# Patient Record
Sex: Female | Born: 1940 | Race: White | Hispanic: No | Marital: Married | Smoking: Never smoker
Health system: Southern US, Community
[De-identification: ages and names within clinical notes are randomized; demographics above are authoritative.]

## PROBLEM LIST (undated history)

## (undated) DIAGNOSIS — J309 Allergic rhinitis, unspecified: Secondary | ICD-10-CM

## (undated) DIAGNOSIS — K589 Irritable bowel syndrome without diarrhea: Secondary | ICD-10-CM

## (undated) DIAGNOSIS — J40 Bronchitis, not specified as acute or chronic: Secondary | ICD-10-CM

## (undated) DIAGNOSIS — N289 Disorder of kidney and ureter, unspecified: Secondary | ICD-10-CM

## (undated) DIAGNOSIS — H1045 Other chronic allergic conjunctivitis: Secondary | ICD-10-CM

## (undated) DIAGNOSIS — I639 Cerebral infarction, unspecified: Secondary | ICD-10-CM

## (undated) DIAGNOSIS — J82 Pulmonary eosinophilia, not elsewhere classified: Principal | ICD-10-CM

## (undated) DIAGNOSIS — K5790 Diverticulosis of intestine, part unspecified, without perforation or abscess without bleeding: Secondary | ICD-10-CM

## (undated) DIAGNOSIS — J8281 Chronic eosinophilic pneumonia: Secondary | ICD-10-CM

## (undated) DIAGNOSIS — I10 Essential (primary) hypertension: Secondary | ICD-10-CM

## (undated) DIAGNOSIS — J328 Other chronic sinusitis: Secondary | ICD-10-CM

## (undated) HISTORY — DX: Bronchitis, not specified as acute or chronic: J40

## (undated) HISTORY — DX: Disorder of kidney and ureter, unspecified: N28.9

## (undated) HISTORY — PX: VESICOVAGINAL FISTULA CLOSURE W/ TAH: SUR271

## (undated) HISTORY — DX: Allergic rhinitis, unspecified: J30.9

## (undated) HISTORY — DX: Other chronic sinusitis: J32.8

## (undated) HISTORY — PX: TONSILLECTOMY: SUR1361

## (undated) HISTORY — PX: BRONCHOSCOPY: SUR163

## (undated) HISTORY — DX: Other chronic allergic conjunctivitis: H10.45

## (undated) HISTORY — PX: BREAST LUMPECTOMY: SHX2

## (undated) HISTORY — PX: APPENDECTOMY: SHX54

## (undated) HISTORY — DX: Pulmonary eosinophilia, not elsewhere classified: J82

## (undated) HISTORY — DX: Cerebral infarction, unspecified: I63.9

## (undated) HISTORY — DX: Chronic eosinophilic pneumonia: J82.81

---

## 1998-02-13 ENCOUNTER — Inpatient Hospital Stay (HOSPITAL_COMMUNITY): Admission: EM | Admit: 1998-02-13 | Discharge: 1998-02-17 | Payer: Self-pay | Admitting: Emergency Medicine

## 1998-02-13 ENCOUNTER — Encounter: Payer: Self-pay | Admitting: Endocrinology

## 1998-11-24 ENCOUNTER — Inpatient Hospital Stay (HOSPITAL_COMMUNITY): Admission: RE | Admit: 1998-11-24 | Discharge: 1998-11-26 | Payer: Self-pay | Admitting: Gastroenterology

## 1998-11-24 ENCOUNTER — Encounter: Payer: Self-pay | Admitting: Gastroenterology

## 1998-12-26 ENCOUNTER — Ambulatory Visit (HOSPITAL_COMMUNITY): Admission: RE | Admit: 1998-12-26 | Discharge: 1998-12-26 | Payer: Self-pay | Admitting: Gastroenterology

## 1999-11-27 ENCOUNTER — Encounter: Admission: RE | Admit: 1999-11-27 | Discharge: 1999-11-27 | Payer: Self-pay | Admitting: Cardiology

## 1999-11-27 ENCOUNTER — Encounter: Payer: Self-pay | Admitting: Cardiology

## 2000-12-01 ENCOUNTER — Encounter: Admission: RE | Admit: 2000-12-01 | Discharge: 2000-12-01 | Payer: Self-pay | Admitting: Internal Medicine

## 2000-12-01 ENCOUNTER — Encounter: Payer: Self-pay | Admitting: Internal Medicine

## 2001-03-03 ENCOUNTER — Encounter: Payer: Self-pay | Admitting: Internal Medicine

## 2001-03-03 ENCOUNTER — Encounter: Admission: RE | Admit: 2001-03-03 | Discharge: 2001-03-03 | Payer: Self-pay | Admitting: Internal Medicine

## 2001-12-06 ENCOUNTER — Encounter: Admission: RE | Admit: 2001-12-06 | Discharge: 2001-12-06 | Payer: Self-pay | Admitting: Internal Medicine

## 2001-12-06 ENCOUNTER — Encounter: Payer: Self-pay | Admitting: Internal Medicine

## 2002-12-26 ENCOUNTER — Encounter: Payer: Self-pay | Admitting: Internal Medicine

## 2002-12-26 ENCOUNTER — Encounter: Admission: RE | Admit: 2002-12-26 | Discharge: 2002-12-26 | Payer: Self-pay | Admitting: Internal Medicine

## 2004-01-29 ENCOUNTER — Encounter: Admission: RE | Admit: 2004-01-29 | Discharge: 2004-01-29 | Payer: Self-pay | Admitting: Internal Medicine

## 2004-03-30 ENCOUNTER — Encounter: Admission: RE | Admit: 2004-03-30 | Discharge: 2004-03-30 | Payer: Self-pay | Admitting: Internal Medicine

## 2004-08-19 ENCOUNTER — Ambulatory Visit: Payer: Self-pay | Admitting: Internal Medicine

## 2004-09-04 ENCOUNTER — Ambulatory Visit: Payer: Self-pay | Admitting: Internal Medicine

## 2004-09-07 ENCOUNTER — Ambulatory Visit: Payer: Self-pay | Admitting: Cardiology

## 2004-10-02 ENCOUNTER — Ambulatory Visit: Payer: Self-pay | Admitting: Internal Medicine

## 2005-04-28 ENCOUNTER — Encounter: Admission: RE | Admit: 2005-04-28 | Discharge: 2005-04-28 | Payer: Self-pay | Admitting: Internal Medicine

## 2005-09-28 ENCOUNTER — Ambulatory Visit: Payer: Self-pay | Admitting: Internal Medicine

## 2006-04-29 ENCOUNTER — Ambulatory Visit (HOSPITAL_COMMUNITY): Admission: RE | Admit: 2006-04-29 | Discharge: 2006-04-29 | Payer: Self-pay | Admitting: Internal Medicine

## 2006-05-05 ENCOUNTER — Encounter: Admission: RE | Admit: 2006-05-05 | Discharge: 2006-05-05 | Payer: Self-pay | Admitting: Internal Medicine

## 2007-05-08 ENCOUNTER — Encounter: Admission: RE | Admit: 2007-05-08 | Discharge: 2007-05-08 | Payer: Self-pay | Admitting: Internal Medicine

## 2007-05-12 ENCOUNTER — Encounter: Admission: RE | Admit: 2007-05-12 | Discharge: 2007-05-12 | Payer: Self-pay | Admitting: Internal Medicine

## 2008-06-03 ENCOUNTER — Encounter: Admission: RE | Admit: 2008-06-03 | Discharge: 2008-06-03 | Payer: Self-pay | Admitting: Internal Medicine

## 2008-09-02 ENCOUNTER — Ambulatory Visit: Payer: Self-pay | Admitting: Internal Medicine

## 2008-09-02 DIAGNOSIS — J328 Other chronic sinusitis: Secondary | ICD-10-CM | POA: Insufficient documentation

## 2008-09-02 DIAGNOSIS — J309 Allergic rhinitis, unspecified: Secondary | ICD-10-CM | POA: Insufficient documentation

## 2008-09-02 DIAGNOSIS — J40 Bronchitis, not specified as acute or chronic: Secondary | ICD-10-CM | POA: Insufficient documentation

## 2008-09-02 DIAGNOSIS — H1045 Other chronic allergic conjunctivitis: Secondary | ICD-10-CM | POA: Insufficient documentation

## 2008-09-02 LAB — CONVERTED CEMR LAB
Basophils Absolute: 0.1 10*3/uL (ref 0.0–0.1)
Basophils Relative: 1 % (ref 0.0–3.0)
Eosinophils Absolute: 1.2 10*3/uL — ABNORMAL HIGH (ref 0.0–0.7)
Eosinophils Relative: 14.6 % — ABNORMAL HIGH (ref 0.0–5.0)
HCT: 38.9 % (ref 36.0–46.0)
Hemoglobin: 13.3 g/dL (ref 12.0–15.0)
IgE (Immunoglobulin E), Serum: 36.2 intl units/mL (ref 0.0–180.0)
Lymphocytes Relative: 29.4 % (ref 12.0–46.0)
Lymphs Abs: 2.5 10*3/uL (ref 0.7–4.0)
MCHC: 34.3 g/dL (ref 30.0–36.0)
MCV: 95 fL (ref 78.0–100.0)
Monocytes Absolute: 0.5 10*3/uL (ref 0.1–1.0)
Monocytes Relative: 5.8 % (ref 3.0–12.0)
Neutro Abs: 4.1 10*3/uL (ref 1.4–7.7)
Neutrophils Relative %: 49.2 % (ref 43.0–77.0)
Platelets: 263 10*3/uL (ref 150.0–400.0)
RBC: 4.09 M/uL (ref 3.87–5.11)
RDW: 13 % (ref 11.5–14.6)
WBC: 8.4 10*3/uL (ref 4.5–10.5)

## 2008-09-03 ENCOUNTER — Telehealth (INDEPENDENT_AMBULATORY_CARE_PROVIDER_SITE_OTHER): Payer: Self-pay | Admitting: *Deleted

## 2009-03-18 ENCOUNTER — Ambulatory Visit: Payer: Self-pay | Admitting: Internal Medicine

## 2009-06-16 ENCOUNTER — Encounter: Admission: RE | Admit: 2009-06-16 | Discharge: 2009-06-16 | Payer: Self-pay | Admitting: Internal Medicine

## 2010-05-12 ENCOUNTER — Other Ambulatory Visit: Payer: Self-pay | Admitting: Internal Medicine

## 2010-05-12 DIAGNOSIS — Z1239 Encounter for other screening for malignant neoplasm of breast: Secondary | ICD-10-CM

## 2010-05-19 ENCOUNTER — Ambulatory Visit
Admission: RE | Admit: 2010-05-19 | Discharge: 2010-05-19 | Payer: Self-pay | Source: Home / Self Care | Attending: Internal Medicine | Admitting: Internal Medicine

## 2010-05-19 ENCOUNTER — Ambulatory Visit: Admit: 2010-05-19 | Payer: Self-pay | Admitting: Internal Medicine

## 2010-05-20 ENCOUNTER — Telehealth: Payer: Self-pay | Admitting: Internal Medicine

## 2010-05-20 ENCOUNTER — Encounter: Payer: Self-pay | Admitting: Internal Medicine

## 2010-05-27 NOTE — Progress Notes (Signed)
Summary: needs copies of 1/31 prescriptions and instructions and results  Phone Note Call from Patient   Caller: Patient Call For: dr Axelle Szwed Summary of Call: Patient phoned she was seen yesterday and was sent down for chest xray, she was given three pieces of paper the order for the chest xray, her instruction sheet and a prescription for a z-pack. When she got home she didnt have any of the sheets and wanted to know if we could send her a copy of her instruction sheet and the prescription. She can be reached [phone] wants results of chest xray Initial call taken by: Vedia Coffer,  May 20, 2010 9:16 AM  Follow-up for Phone Call        Pt thinks she left her patient  instruction sheet and zpak prescription in xray yesterday. SHe is requesting I mail a copy of instruction sheet to her and send rx to [facility]. Copy mailed, rx sent, and pt also advised of CXR results. Carron Curie CMA  May 20, 2010 10:10 AM     Prescriptions: ZITHROMAX Z-PAK 250 MG TABS (AZITHROMYCIN) 2 today then one daily  #1 pak x 0   Entered by:   Carron Curie CMA   Authorized by:   Waymon Budge MD   Signed by:   Carron Curie CMA on 05/20/2010   Method used:   [facility]        [facility] [id number][facility] (retail)       [address]       [place], [place]  [address]       Ph: [phone] or [phone]       Fax: [phone]   RxID:   [id number]

## 2010-05-27 NOTE — Letter (Signed)
Summary: [facility]  [address]   [place], [place] [address]   Phone: [phone]  Fax: [phone]    05/19/2010  Patient Instructions:   1)  Please schedule a follow-up appointment in 1 year. 2)  neb a 3)  depo 80 4)  script to hold for Z pak antibiotic 5)  A chest x-ray has been recommended.  Your imaging study may require preauthorization.  6)  Consider plain mucinex 600 mg twice daily if you need help clearing thick mucus.

## 2010-05-27 NOTE — Assessment & Plan Note (Signed)
Summary: asthma/jd   Primary Provider/Referring Provider:  Perini  CC:  c/o chest tightness, cough with yellow sputum, and sob with exertion.  symptoms x 1 week.  Wheeze sometimes at night.  History of Present Illness: 09/02/08- allergic rhinitis, chronic rhinosinuisitis, allergfic conjunctivitis C/oher chronic nasal congestion- worse this Spring. # months ago began chest congestion- caqlled adult onset asthma by Dr Waynard Edwards. He gave sample Proair- not much help. Gave asmanex which helped once before but not now. Took a large green antibiotic (Avelox?), then she began some left over doxycycline which is helping more. coughing up small green globules. some tightness and dyspnea. Denies fever. Minor GI from antibiotics. Prior skin tests - not on  vaccine in 10- 15 yrs. Her husband asks about being able to give her shots at home.   31-Mar-2009- Allergic rhinitis, chronic rhinosinusitis, allergic conjunctivitis Nose is best she expects to get. She asked about Xyzal compared with Zyrtec.  Bothersome sense of postnasal drip at night with cough but no heartburn or recognized reflux. Eyes burn and itch, not relieved by otc eyedrop.  May 19, 2010-  Allergic rhinitis, chronic rhinosinusitis, allergic conjunctivitis Nurse-CC: c/o chest tightness, cough with yellow sputum, sob with exertion.  symptoms x 1 week.  Wheeze sometimes at night. Off Advair- uses Asmanex intermittently.No rescue inhaler. Acute visit- Head congestion moving into chest with raspy cough. Tussive chest sore. Denies fever, purulent or GI. Frontal headache. Occasional wheeze at night, not routine.  Dr Perini's NP gave a neb for something similar a few weeks ago with benefit. No recent CXR.   Preventive Screening-Counseling & Management  Alcohol-Tobacco     Smoking Status: never  Current Medications (verified): 1)  Premarin 0.3 Mg Tabs (Estrogens Conjugated) .... Take 1 By Mouth Every Other Day 2)  Simvastatin 40 Mg Tabs  (Simvastatin) .... Take 1 By Mouth Once Daily 3)  Diovan Hct 80-12.5 Mg Tabs (Valsartan-Hydrochlorothiazide) .... Take 1 By Mouth Once Daily 4)  Singulair 10 Mg Tabs (Montelukast Sodium) .... Take 1 By Mouth At Bedtime 5)  Zyrtec Allergy 10 Mg Tabs (Cetirizine Hcl) .... Take 1 By Mouth  Every Morning 6)  Nasacort Aq 55 Mcg/act Aers (Triamcinolone Acetonide(Nasal)) .Marland Kitchen.. 1-2 Sprays in Each Nostril Daily As Needed 7)  Asmanex 14 Metered Doses 220 Mcg/inh Aepb (Mometasone Furoate) .Marland Kitchen.. 1-2 Ouffs Once Daily As Needed 8)  Xalatan 0.005 % Soln (Latanoprost) .Marland Kitchen.. 1drop Right Eye Dailly  Allergies: 1)  ! 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  Married to Dr [name]  Risk Factors: Smoking Status: never (05/19/2010)  Review of Systems      See HPI       The patient complains of non-productive cough and nasal congestion/difficulty breathing through nose.  The patient denies shortness of breath with activity, shortness of breath at rest, productive cough, coughing up blood, chest pain, irregular heartbeats, acid heartburn, indigestion, loss of appetite, weight change, abdominal pain, difficulty swallowing, sore throat, tooth/dental problems, and headaches.    Vital Signs:  Patient profile:   70 year old female Height:      65 inches Weight:      150.50 pounds BMI:  25.14 O2 Sat:      98 % on Room air Pulse rate:   65 / minute BP sitting:   130 / 80  (left arm) Cuff size:   regular  Vitals Entered By: Kandice Hams CMA  (May 19, 2010 4:00 PM)  O2 Flow:  Room air CC: c/o chest tightness,cough with yellow sputum, sob with exertion.  symptoms x 1 week.  Wheeze sometimes at night   Physical Exam  Additional Exam:  General: A/Ox3; pleasant and cooperative, NAD, looks tired SKIN: no rash, lesions NODES: no lymphadenopathy HEENT: [place]/AT, EOM- WNL, Conjuctivae- clear, PERRLA, TM-WNL, Nose- narrow nasal, Throat- clear and wnl, Mallampati Ii NECK: Supple w/ fair ROM, JVD- none, normal carotid impulses w/o bruits Thyroid- n CHEST: Clear to P&A, coarse breath sounds   bases- not rhonchi or rales. 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  Problem # 2:  Hx of BRONCHITIS (ICD-490)  Developing bronchitis. There is more fine crackle than I expected and I will do CXR, give neb and depo and script to hold for Z pak. Discussed mucinex. She is not describing a persistent asthma pattern and i decided not to make an issue of her asmanex use today. We may reconsider that later.  The following medications were removed from the medication list:    Benzonatate 100 Mg Caps (Benzonatate) .Marland Kitchen... 1 or 2 four times a day as needed cough    Advair Diskus 250-50 Mcg/dose Misc (Fluticasone-salmeterol) ..... Inhale 1 puff two times a day.  rinse mouth out after use Her updated medication list for this problem includes:    Singulair 10 Mg Tabs (Montelukast sodium) .Marland Kitchen... Take 1 by mouth at bedtime    Asmanex 14 Metered Doses 220 Mcg/inh Aepb (Mometasone furoate) .Marland Kitchen... 1-2 ouffs once daily as needed    Zithromax Z-pak 250 Mg Tabs (Azithromycin) .Marland Kitchen... 2 today then one daily  Medications Added to Medication List This Visit: 1)  Premarin 0.3  Mg Tabs (Estrogens conjugated) .... Take 1 by mouth every other day 2)  Asmanex 14 Metered Doses 220 Mcg/inh Aepb (Mometasone furoate) .Marland Kitchen.. 1-2 ouffs once daily as needed 3)  Xalatan 0.005 % Soln (Latanoprost) .Marland Kitchen.. 1drop right eye dailly 4)  Zithromax Z-pak 250 Mg Tabs (Azithromycin) .... 2 today then one daily  Other Orders: Est. Patient Level III (65784) T-2 View CXR (71020TC)  Patient Instructions: 1)  Please schedule a follow-up appointment in 1 year. 2)  neb a 3)  depo 80 4)  script to hold for Z pak antibiotic 5)  A chest x-ray has been recommended.  Your imaging study may require preauthorization.  6)  Consider plain mucinex 600 mg twice daily if you need help clearing thick mucus. 7)  ..........................................................................................Marland Kitchen 8)  DG CHEST 2 VIEW - [id number] 9)    10)  Clinical Data: Cough, chest tightness 11)    12)  CHEST - 2 VIEW 13)    14)  Comparison: None. 15)    16)  Findings: The lungs are clear.  Mediastinal contours are normal. 17)  The heart is within upper limits of normal.  No acute bony 18)  abnormality is seen. 19)    20)  IMPRESSION: 21)  No active lung disease.  Borderline cardiomegaly. 22)    23)  Read By:  Juline Patch,  M.D.     24)  Released By:  Juline Patch,  M.D. 25)  Signed by Waymon Budge MD on 05/19/2010 at 7:41 PM 26)  ________________________________________________________________________ 27)  CXR- no active disease. 28)  Signed by Waymon Budge MD on 05/19/2010 at 7:42 PM 29)  cc Dr Waynard Edwards Prescriptions: Christena Deem Z-PAK 250 MG TABS (AZITHROMYCIN) 2 today then one daily  #1 pak x 0   Entered and Authorized by:   Waymon Budge MD   Signed by:   Waymon Budge MD on 05/19/2010   Method used:   Print then Give to Patient   RxID:   [id number]

## 2010-06-19 ENCOUNTER — Ambulatory Visit
Admission: RE | Admit: 2010-06-19 | Discharge: 2010-06-19 | Disposition: A | Discharge status: Home / Self Care | Payer: Medicare Other | Source: Ambulatory Visit | Attending: Internal Medicine | Admitting: Internal Medicine

## 2010-06-19 DIAGNOSIS — Z1239 Encounter for other screening for malignant neoplasm of breast: Secondary | ICD-10-CM

## 2010-06-22 ENCOUNTER — Emergency Department (HOSPITAL_COMMUNITY): Payer: Medicare Other

## 2010-06-22 ENCOUNTER — Emergency Department (HOSPITAL_COMMUNITY)
Admission: EM | Admit: 2010-06-22 | Discharge: 2010-06-22 | Disposition: A | Discharge status: Home / Self Care | Payer: Medicare Other | Attending: Emergency Medicine | Admitting: Emergency Medicine

## 2010-06-22 DIAGNOSIS — N390 Urinary tract infection, site not specified: Secondary | ICD-10-CM | POA: Insufficient documentation

## 2010-06-22 DIAGNOSIS — R079 Chest pain, unspecified: Secondary | ICD-10-CM | POA: Insufficient documentation

## 2010-06-22 DIAGNOSIS — R1013 Epigastric pain: Secondary | ICD-10-CM | POA: Insufficient documentation

## 2010-06-22 DIAGNOSIS — J189 Pneumonia, unspecified organism: Secondary | ICD-10-CM | POA: Insufficient documentation

## 2010-06-22 DIAGNOSIS — E876 Hypokalemia: Secondary | ICD-10-CM | POA: Insufficient documentation

## 2010-06-22 DIAGNOSIS — I10 Essential (primary) hypertension: Secondary | ICD-10-CM | POA: Insufficient documentation

## 2010-06-22 DIAGNOSIS — R51 Headache: Secondary | ICD-10-CM | POA: Insufficient documentation

## 2010-06-22 LAB — COMPREHENSIVE METABOLIC PANEL
ALT: 19 U/L (ref 0–35)
AST: 18 U/L (ref 0–37)
Albumin: 3 g/dL — ABNORMAL LOW (ref 3.5–5.2)
Alkaline Phosphatase: 117 U/L (ref 39–117)
CO2: 29 mEq/L (ref 19–32)
Calcium: 9 mg/dL (ref 8.4–10.5)
Creatinine, Ser: 0.61 mg/dL (ref 0.4–1.2)
GFR calc Af Amer: >60 mL/min (ref >60)
GFR calc non Af Amer: >60 mL/min (ref >60)
Glucose, Bld: 111 mg/dL — ABNORMAL HIGH (ref 70–99)
Sodium: 137 mEq/L (ref 135–145)
Total Bilirubin: 0.9 mg/dL (ref 0.3–1.2)
Total Protein: 7.1 g/dL (ref 6.0–8.3)

## 2010-06-22 LAB — URINE MICROSCOPIC-ADD ON

## 2010-06-22 LAB — DIFFERENTIAL
Basophils Absolute: 0 10*3/uL (ref 0.0–0.1)
Basophils Relative: 0 % (ref 0–1)
Eosinophils Absolute: 0.6 10*3/uL (ref 0.0–0.7)
Lymphs Abs: 1.3 10*3/uL (ref 0.7–4.0)
Monocytes Absolute: 1.3 10*3/uL — ABNORMAL HIGH (ref 0.1–1.0)
Monocytes Relative: 8 % (ref 3–12)
Neutro Abs: 14.1 10*3/uL — ABNORMAL HIGH (ref 1.7–7.7)
Neutrophils Relative %: 81 % — ABNORMAL HIGH (ref 43–77)

## 2010-06-22 LAB — URINALYSIS, ROUTINE W REFLEX MICROSCOPIC
Glucose, UA: NEGATIVE mg/dL
Hgb urine dipstick: NEGATIVE
Ketones, ur: 15 mg/dL — AB
Protein, ur: NEGATIVE mg/dL
Specific Gravity, Urine: 1.028 (ref 1.005–1.030)
Urobilinogen, UA: 1 mg/dL (ref 0.0–1.0)
pH: 6 (ref 5.0–8.0)

## 2010-06-22 LAB — CBC
HCT: 37.9 % (ref 36.0–46.0)
Hemoglobin: 12.9 g/dL (ref 12.0–15.0)
MCH: 31.4 pg (ref 26.0–34.0)
MCHC: 34 g/dL (ref 30.0–36.0)
Platelets: 506 10*3/uL — ABNORMAL HIGH (ref 150–400)
RBC: 4.11 MIL/uL (ref 3.87–5.11)
RDW: 12.7 % (ref 11.5–15.5)
WBC: 17.4 10*3/uL — ABNORMAL HIGH (ref 4.0–10.5)

## 2010-06-22 LAB — POCT CARDIAC MARKERS
CKMB, poc: 1.7 ng/mL (ref 1.0–8.0)
Myoglobin, poc: 205 ng/mL (ref 12–200)
Troponin i, poc: <0.05 ng/mL (ref 0.00–0.09)

## 2010-06-25 LAB — URINE CULTURE
Colony Count: 30000
Culture  Setup Time: 201203060119

## 2010-07-03 ENCOUNTER — Ambulatory Visit (INDEPENDENT_AMBULATORY_CARE_PROVIDER_SITE_OTHER): Payer: Medicare Other | Admitting: Internal Medicine

## 2010-07-03 ENCOUNTER — Encounter (INDEPENDENT_AMBULATORY_CARE_PROVIDER_SITE_OTHER): Payer: Self-pay | Admitting: *Deleted

## 2010-07-03 ENCOUNTER — Other Ambulatory Visit: Payer: Self-pay | Admitting: Internal Medicine

## 2010-07-03 ENCOUNTER — Encounter: Payer: Self-pay | Admitting: Internal Medicine

## 2010-07-03 ENCOUNTER — Inpatient Hospital Stay (HOSPITAL_COMMUNITY)
Admission: AD | Admit: 2010-07-03 | Discharge: 2010-07-17 | DRG: 166 | Disposition: A | Discharge status: Home / Self Care | Payer: Medicare Other | Source: Ambulatory Visit | Attending: Internal Medicine | Admitting: Internal Medicine

## 2010-07-03 ENCOUNTER — Other Ambulatory Visit: Payer: Medicare Other

## 2010-07-03 ENCOUNTER — Ambulatory Visit (INDEPENDENT_AMBULATORY_CARE_PROVIDER_SITE_OTHER)
Admission: RE | Admit: 2010-07-03 | Discharge: 2010-07-03 | Disposition: A | Discharge status: Home / Self Care | Payer: Medicare Other | Source: Ambulatory Visit | Attending: Internal Medicine | Admitting: Internal Medicine

## 2010-07-03 ENCOUNTER — Inpatient Hospital Stay (HOSPITAL_COMMUNITY): Payer: Medicare Other

## 2010-07-03 DIAGNOSIS — R7309 Other abnormal glucose: Secondary | ICD-10-CM | POA: Diagnosis not present

## 2010-07-03 DIAGNOSIS — I10 Essential (primary) hypertension: Secondary | ICD-10-CM | POA: Diagnosis present

## 2010-07-03 DIAGNOSIS — J8289 Other pulmonary eosinophilia, not elsewhere classified: Principal | ICD-10-CM | POA: Diagnosis present

## 2010-07-03 DIAGNOSIS — Z79899 Other long term (current) drug therapy: Secondary | ICD-10-CM

## 2010-07-03 DIAGNOSIS — J189 Pneumonia, unspecified organism: Secondary | ICD-10-CM

## 2010-07-03 DIAGNOSIS — E8809 Other disorders of plasma-protein metabolism, not elsewhere classified: Secondary | ICD-10-CM | POA: Diagnosis not present

## 2010-07-03 DIAGNOSIS — R51 Headache: Secondary | ICD-10-CM

## 2010-07-03 DIAGNOSIS — J159 Unspecified bacterial pneumonia: Secondary | ICD-10-CM

## 2010-07-03 DIAGNOSIS — T367X5A Adverse effect of antifungal antibiotics, systemically used, initial encounter: Secondary | ICD-10-CM | POA: Diagnosis not present

## 2010-07-03 DIAGNOSIS — I509 Heart failure, unspecified: Secondary | ICD-10-CM

## 2010-07-03 DIAGNOSIS — B370 Candidal stomatitis: Secondary | ICD-10-CM | POA: Diagnosis not present

## 2010-07-03 DIAGNOSIS — E872 Acidosis, unspecified: Secondary | ICD-10-CM | POA: Diagnosis not present

## 2010-07-03 DIAGNOSIS — J329 Chronic sinusitis, unspecified: Secondary | ICD-10-CM | POA: Diagnosis present

## 2010-07-03 DIAGNOSIS — N179 Acute kidney failure, unspecified: Secondary | ICD-10-CM | POA: Diagnosis not present

## 2010-07-03 DIAGNOSIS — J82 Pulmonary eosinophilia, not elsewhere classified: Secondary | ICD-10-CM

## 2010-07-03 DIAGNOSIS — J9600 Acute respiratory failure, unspecified whether with hypoxia or hypercapnia: Secondary | ICD-10-CM | POA: Diagnosis not present

## 2010-07-03 DIAGNOSIS — R109 Unspecified abdominal pain: Secondary | ICD-10-CM

## 2010-07-03 DIAGNOSIS — H1045 Other chronic allergic conjunctivitis: Secondary | ICD-10-CM | POA: Diagnosis present

## 2010-07-03 DIAGNOSIS — J8281 Chronic eosinophilic pneumonia: Secondary | ICD-10-CM | POA: Insufficient documentation

## 2010-07-03 DIAGNOSIS — E785 Hyperlipidemia, unspecified: Secondary | ICD-10-CM | POA: Diagnosis present

## 2010-07-03 DIAGNOSIS — J309 Allergic rhinitis, unspecified: Secondary | ICD-10-CM | POA: Diagnosis present

## 2010-07-03 DIAGNOSIS — E876 Hypokalemia: Secondary | ICD-10-CM | POA: Diagnosis present

## 2010-07-03 DIAGNOSIS — R231 Pallor: Secondary | ICD-10-CM | POA: Diagnosis not present

## 2010-07-03 LAB — COMPREHENSIVE METABOLIC PANEL
Albumin: 2.4 g/dL — ABNORMAL LOW (ref 3.5–5.2)
Alkaline Phosphatase: 235 U/L — ABNORMAL HIGH (ref 39–117)
BUN: 14 mg/dL (ref 6–23)
Chloride: 96 mEq/L (ref 96–112)
Glucose, Bld: 112 mg/dL — ABNORMAL HIGH (ref 70–99)
Potassium: 3.3 mEq/L — ABNORMAL LOW (ref 3.5–5.1)
Total Bilirubin: 0.8 mg/dL (ref 0.3–1.2)

## 2010-07-03 LAB — CBC WITH DIFFERENTIAL/PLATELET
Eosinophils Absolute: 2.5 10*3/uL — ABNORMAL HIGH (ref 0.0–0.7)
MCHC: 34.1 g/dL (ref 30.0–36.0)
MCV: 93.3 fl (ref 78.0–100.0)
Monocytes Absolute: 1.5 10*3/uL — ABNORMAL HIGH (ref 0.1–1.0)
Neutrophils Relative %: 73.4 % (ref 43.0–77.0)
Platelets: 731 10*3/uL — ABNORMAL HIGH (ref 150.0–400.0)
WBC: 22.2 10*3/uL (ref 4.5–10.5)

## 2010-07-03 LAB — BASIC METABOLIC PANEL
BUN: 17 mg/dL (ref 6–23)
Chloride: 95 mEq/L — ABNORMAL LOW (ref 96–112)
Creatinine, Ser: 0.9 mg/dL (ref 0.4–1.2)

## 2010-07-03 LAB — URINALYSIS, ROUTINE W REFLEX MICROSCOPIC
Hgb urine dipstick: NEGATIVE
Nitrite: NEGATIVE
Urobilinogen, UA: 0.2 (ref 0.0–1.0)

## 2010-07-03 MED ORDER — IOHEXOL 300 MG/ML  SOLN
100.0000 mL | Freq: Once | INTRAMUSCULAR | Status: AC | PRN
  Administered 2010-07-03: 100 mL via INTRAVENOUS

## 2010-07-04 ENCOUNTER — Encounter: Payer: Self-pay | Admitting: Internal Medicine

## 2010-07-04 DIAGNOSIS — K7689 Other specified diseases of liver: Secondary | ICD-10-CM

## 2010-07-04 DIAGNOSIS — R6889 Other general symptoms and signs: Secondary | ICD-10-CM

## 2010-07-04 DIAGNOSIS — J189 Pneumonia, unspecified organism: Secondary | ICD-10-CM

## 2010-07-04 NOTE — H&P (Signed)
[name], [name]             ACCOUNT NO.:  [id number]  MEDICAL RECORD NO.:  [id number]           PATIENT TYPE:  I  LOCATION:  1344                         FACILITY:  [facility]  PHYSICIAN:  Clinton D. Maple Hudson, MD, FCCP, FACPDATE OF BIRTH:  06/10/40  DATE OF ADMISSION:  07/03/2010 DATE OF DISCHARGE:                             HISTORY & PHYSICAL   ADMISSION DIAGNOSES: 1. Pneumonia, not otherwise specified. 2. Headache. 3. Right flank pain, question urinary infection.  HISTORY OF PRESENT ILLNESS:  A 70 year old nonsmoking white female, wife of Dr. [name], brought by her husband to my office this afternoon with concern of abnormal chest x-ray and fever.  I follow her for allergic rhinitis, chronic rhinosinusitis, and allergic conjunctivitis. Dr. Rodrigo Ran is her primary physician.  She was visiting in [facility] approximately 3 weeks ago where she felt well, but noticed bilateral leg cramps.  She returned home around March 1st, but by March 5th had become acutely ill with a febrile illness and some dry cough.  She was treated with a Z-PAK, but failed to improve.  On March 5th, she had gone to the emergency room where chest x-ray showed bilateral pulmonary infiltrates and leukocytosis.  She also was experiencing some right flank pain and there was concern that she had both pneumonia and urinary infection.  She was treated with antibiotics, which I believe included Rocephin and Avelox.  On followup with Dr. Waynard Edwards on March 9th, chest x-ray again showed bilateral diffuse infiltrates predominantly in the left lower lobe and left upper lobe with no effusion.  She was given Rocephin IM and started on Augmentin. She had given previous history of rash allergy from PENICILLIN, but seemed to tolerate the Augmentin.  On arrival at my office today as I understood the history, she had recognize no sick exposure.  Still feeling badly, predominately with weakness, difficulty  getting out of bed.  Some dry cough has persisted.  There have not been significant chills or purulent sputum.  She has had some right frontal headache and maybe right maxillary pain.  She is still having some right flank pain, but without dysuria.  She denied arthralgias and indicated the myalgias had improved.  There had been hypokalemia in the emergency room visit, which I believe was treated.  She denied purulence or blood.  Fever is persistent now for 16 days.  There has been no chest pain.  She had had no GI upset including nausea or vomiting, but had been anorexic.  Today, she had a loose stool leading to some discussion to the possibility of early C difficile colitis versus simple antibiotic GI upset.  She had started a probiotic.  I sent her for laboratory including chest x-ray, which showed worsening bilateral pneumonia with patchy infiltrates in all lobes, no effusion.  I reviewed the images myself on the available screen.  Her WBC was 22,200 with 73% PMNs and 11% eosinophils. Urinalysis showed 5-7 WBCs.  I discussed with Dr. Daleen Bo Ava from her primary care group.  I am admitting to [facility] for evaluation and treatment.  We are getting the CT scan of the  chest for better definition of the pulmonary parenchymal infiltrates and exclusion of pulmonary embolism, further appropriate labs and initiation of antibiotic therapy.  CURRENT HOME MEDICATION: 1. Premarin 0.3 mg every other day. 2. Simvastatin 40 mg p.o. daily. 3. Diovan HCT 80/12.5 mg 1 daily. 4. Singulair 10 mg 1 daily. 5. Zyrtec 10 mg daily. 6. Nasacort AQ 1-2 sprays each nostril daily when needed. 7. Asmanex 220 one-two puffs once daily when needed. 8. Xalatan 0.005% 1 drop right eye daily.  MEDICAL ALLERGY:  PENICILLIN, history of rash.  REVIEW OF SYSTEMS:  See HPI.  Complaining now of weakness, easy fatigue, anorexia, fever, dyspnea on exertion, headache, right flank pain. Negative for weight gain,  vision loss, decreased hearing, hoarseness, chest pain, syncope, peripheral edema, hemoptysis, abdominal pain, severe indigestion or heartburn, hematuria, rash, abnormal bleeding, lymphadenopathy or angioedema, syncope or confusion, vomiting or active arthritis pain.  PAST MEDICAL HISTORY: 1. Bronchitis. 2. Rhinosinusitis. 3. Chronic conjunctivitis. 4. Allergic rhinitis.  PAST SURGICAL HISTORY:  C-section x4, hysterectomy, benign right breast lump, tonsillectomy, appendectomy.  Family History: Father died aortic aneurysm. Mother died Congestive heart failure.  Social History:  Never smoked, married to Dr. [name]. First husband died of liver disease.  OBJECTIVE:  VITAL SIGNS:  Height 65 inches, weight 139 pounds, BMI 23.21, oxygen saturation was 90% on room air, pulse rate 84. BP 104/65 GENERAL:  Tired appearing, but alert, fully oriented, cooperative, and not in active physical distress. SKIN:  No visible rash.  Adenopathy:  None found at the neck, supraclavicular, or axillary areas. HEENT:  Oral mucosa clear.  Vision and hearing grossly intact.  No conjunctival injection.  Speech is articulate.  There is no neck vein distention or stridor.  No visible postnasal drainage. NECK:  Supple. CHEST:  Quiet air flow with fine high-pitched crackles, especially in the right infrascapular area, but no dullness or rub.  No cough and no increased work of breathing. HEART:  Trace systolic murmur at the left sternal border.  Regular rhythm.  No rub. ABDOMEN:  Nontender without enlargement of liver or spleen.  Bowel sounds are faintly heard. BREASTS/RECTAL/PELVIC:  Not examined, noncontributory. EXTREMITIES:  No edema, cyanosis, or clubbing.  Calves were nontender. Homan's negative. NEUROLOGIC:  Grossly intact on observation, ambulatory without tremor.  IMPRESSION:  Because of the leukocytosis, I have to consider first possibility that this is a bacterial infection with a  resistant organism. Leukocytosis in this range would be unusual with a viral pneumonia.   Despite her history of rash allergy to PENICILLIN, I note that she had been on Augmentin for the past week without apparent problem and therefore we will start with vancomycin and Zosyn recognizing that early revision may be required.  Air travel and exposure in [facility] raise questions of coccidioidomycosis, legionnaires, pulmonary embolism.  Lack of response to the broad- spectrum antibiotics she has had so far, raises the possibility that this is a cryptogenic organizing pneumonia/bronchiolitis obliterans- organizing pneumonia.  Her predominant symptom seems to be fatigue with incidental mention of right frontal and maxillary headache, raising possibility of sinusitis, and her recent right flank pain, raising question of urologic process. Early sepsis ARDS is possible, but she has been on antibiotics now for over 2 weeks, HR only 90, HCO3 28.  Her urine is not active enough to make the concern that she has a urinary infection with sepsis, but recognize we may be looking at a muddied picture by now of partly treated infection. I am considering possible  need for lung tissue biopsy. Renal function looks normal. Hypokalemia may be due to the HCT in her outpatient BP therapy.     Clinton D. Maple Hudson, MD, FCCP, FACP     CDY/MEDQ  D:  07/03/2010  T:  07/04/2010  Job:  [id number]  Electronically Signed by Jetty Duhamel MD FCCP FACP on 07/04/2010 02:11:11 PM

## 2010-07-05 LAB — DIFFERENTIAL
Basophils Absolute: 0 10*3/uL (ref 0.0–0.1)
Lymphocytes Relative: 14 % (ref 12–46)
Monocytes Absolute: 1.4 10*3/uL — ABNORMAL HIGH (ref 0.1–1.0)
Neutro Abs: 10.8 10*3/uL — ABNORMAL HIGH (ref 1.7–7.7)

## 2010-07-05 LAB — CBC
HCT: 32.7 % — ABNORMAL LOW (ref 36.0–46.0)
Hemoglobin: 10.8 g/dL — ABNORMAL LOW (ref 12.0–15.0)
MCHC: 33 g/dL (ref 30.0–36.0)

## 2010-07-06 ENCOUNTER — Inpatient Hospital Stay (HOSPITAL_COMMUNITY): Payer: Medicare Other

## 2010-07-06 ENCOUNTER — Other Ambulatory Visit: Payer: Self-pay | Admitting: Internal Medicine

## 2010-07-06 DIAGNOSIS — R918 Other nonspecific abnormal finding of lung field: Secondary | ICD-10-CM

## 2010-07-06 DIAGNOSIS — J189 Pneumonia, unspecified organism: Secondary | ICD-10-CM

## 2010-07-06 DIAGNOSIS — D72829 Elevated white blood cell count, unspecified: Secondary | ICD-10-CM

## 2010-07-06 LAB — CBC
HCT: 30.8 % — ABNORMAL LOW (ref 36.0–46.0)
Hemoglobin: 10.2 g/dL — ABNORMAL LOW (ref 12.0–15.0)
MCH: 30.4 pg (ref 26.0–34.0)
MCH: 30.1 pg (ref 26.0–34.0)
MCHC: 33.1 g/dL (ref 30.0–36.0)
Platelets: 721 10*3/uL — ABNORMAL HIGH (ref 150–400)
RBC: 3.62 MIL/uL — ABNORMAL LOW (ref 3.87–5.11)
RDW: 13.1 % (ref 11.5–15.5)
WBC: 18.8 10*3/uL — ABNORMAL HIGH (ref 4.0–10.5)

## 2010-07-06 LAB — DIFFERENTIAL
Lymphocytes Relative: 13 % (ref 12–46)
Monocytes Absolute: 1.4 10*3/uL — ABNORMAL HIGH (ref 0.1–1.0)
Monocytes Relative: 8 % (ref 3–12)
Neutro Abs: 11.2 10*3/uL — ABNORMAL HIGH (ref 1.7–7.7)

## 2010-07-06 LAB — COMPREHENSIVE METABOLIC PANEL
ALT: 19 U/L (ref 0–35)
CO2: 26 mEq/L (ref 19–32)
Calcium: 8.2 mg/dL — ABNORMAL LOW (ref 8.4–10.5)
Creatinine, Ser: 0.85 mg/dL (ref 0.4–1.2)
GFR calc non Af Amer: >60 mL/min (ref >60)
Glucose, Bld: 98 mg/dL (ref 70–99)

## 2010-07-06 LAB — BASIC METABOLIC PANEL
Calcium: 8.4 mg/dL (ref 8.4–10.5)
Creatinine, Ser: 0.69 mg/dL (ref 0.4–1.2)
GFR calc non Af Amer: >60 mL/min (ref >60)
Glucose, Bld: 98 mg/dL (ref 70–99)
Sodium: 138 mEq/L (ref 135–145)

## 2010-07-06 LAB — GAMMA GT: GGT: 235 U/L — ABNORMAL HIGH (ref 7–51)

## 2010-07-06 LAB — PROTIME-INR: Prothrombin Time: 13.9 seconds (ref 11.6–15.2)

## 2010-07-06 MED ORDER — GADOBENATE DIMEGLUMINE 529 MG/ML IV SOLN
15.0000 mL | Freq: Once | INTRAVENOUS | Status: AC | PRN
  Administered 2010-07-06: 15 mL via INTRAVENOUS

## 2010-07-07 ENCOUNTER — Telehealth: Payer: Self-pay | Admitting: Internal Medicine

## 2010-07-07 DIAGNOSIS — J8289 Other pulmonary eosinophilia, not elsewhere classified: Secondary | ICD-10-CM

## 2010-07-07 DIAGNOSIS — J82 Pulmonary eosinophilia, not elsewhere classified: Secondary | ICD-10-CM

## 2010-07-07 LAB — COCCIDIOIDES ANTIBODIES: Coccidioides Ab CF: <1:2 {titer}

## 2010-07-07 LAB — HEMOCCULT GUIAC POC 1CARD (OFFICE): Fecal Occult Bld: NEGATIVE

## 2010-07-07 LAB — CBC
HCT: 31.1 % — ABNORMAL LOW (ref 36.0–46.0)
Hemoglobin: 10.1 g/dL — ABNORMAL LOW (ref 12.0–15.0)
MCH: 30.1 pg (ref 26.0–34.0)
MCHC: 32.5 g/dL (ref 30.0–36.0)

## 2010-07-07 LAB — BASIC METABOLIC PANEL
CO2: 25 mEq/L (ref 19–32)
Calcium: 7.9 mg/dL — ABNORMAL LOW (ref 8.4–10.5)
Glucose, Bld: 110 mg/dL — ABNORMAL HIGH (ref 70–99)
Sodium: 139 mEq/L (ref 135–145)

## 2010-07-07 LAB — PNEUMOCYSTIS JIROVECI SMEAR BY DFA: Pneumocystis jiroveci Ag: NEGATIVE

## 2010-07-07 LAB — DIFFERENTIAL
Eosinophils Relative: 6 % — ABNORMAL HIGH (ref 0–5)
Lymphs Abs: 1.6 10*3/uL (ref 0.7–4.0)
Monocytes Relative: 7 % (ref 3–12)
Neutro Abs: 21.2 10*3/uL — ABNORMAL HIGH (ref 1.7–7.7)

## 2010-07-07 NOTE — Telephone Encounter (Signed)
Per CDY and pts husband they have already spoken about the patient.

## 2010-07-07 NOTE — Assessment & Plan Note (Signed)
Summary: acute visit/double PNA/kcw   Primary Provider/Referring Provider:  Perini  CC:  Acute visit-recent dx with Double PNA; on 13th day of abx(currently on ugmentin 875 bid ) and 16th day of fevers(using tylenol)..  History of Present Illness: July 03, 2010- == ADMISSION HISTORY AND PHYSICAL== DX1) Pneumonia             (Followed by me for  Allergic rhinitis, chronic rhinosinusitis, allergic conjunctivitis.               Nurse-CC: Acute visit-recent dx with Double PNA; on 13th day of abx(currently on augmentin              875 two  times a day ) and 16th day of fevers(using tylenol).)   HPI: Dr Cheryll Cockayne brought his wife into office for acute viist, concerned about persistent febrile illness.  Acute sick March 1st-. Had been in [facility] 2 weeks. Had bilateral leg cramps there but otherwise felt well.. 1 week after return felt acutely ill with malaise, some dry cough . Developed myalgias, temp 101.4 . Had ? of UTI.  ER  March 5> Double pneumonia and ? UTI. Potassium low . Given Rocephin, Avelox, and Zpak- I am not clear about sequence. CXR 06/26/10 / Dr Sandrea Hughs diffuse infiltrates, predom LLL and LUL, no effusion.  Changed March 9 to augmentin w/ one more dose left, and 1 gm IM Rocephin.  No sick exposure. Still feels badly- weak, hard to get out of bed, some dry cough all along. Right frontal headache and ? maxillary pain.  Denies arthralgias, nodes, purulent or blood. Minimal cough still, never much phlegm; fever x 16 days,  no chest pain. Right flank pain w/o dysuria. GI ok till some diarrhea this AM- anorectic. Started probiotic.  Had had Pneumovax and flu vax. We sent for : CXR: worsening bilateral pneumonia new foci infiltrates, all lobes, no effusion. Images reviewed by me.  WBC- 22,200, 73% PMN, 11% Eos. U/A- 5-7 WBC.  Discussed w/ Dr Larina Earthly for her PCP group. Will admit For eval and treat, including CT, blood cultures, Vanc/ zosyn.   Preventive Screening-Counseling &  Management  Alcohol-Tobacco     Smoking Status: never  Current Medications (verified): 1)  Premarin 0.3 Mg Tabs (Estrogens Conjugated) .... Take 1 By Mouth Every Other Day 2)  Simvastatin 40 Mg Tabs (Simvastatin) .... Take 1 By Mouth Once Daily 3)  Diovan Hct 80-12.5 Mg Tabs (Valsartan-Hydrochlorothiazide) .... Take 1 By Mouth Once Daily 4)  Singulair 10 Mg Tabs (Montelukast Sodium) .... Take 1 By Mouth At Bedtime 5)  Zyrtec Allergy 10 Mg Tabs (Cetirizine Hcl) .... Take 1 By Mouth  Every Morning 6)  Nasacort Aq 55 Mcg/act Aers (Triamcinolone Acetonide(Nasal)) .Marland Kitchen.. 1-2 Sprays in Each Nostril Daily As Needed 7)  Asmanex 14 Metered Doses 220 Mcg/inh Aepb (Mometasone Furoate) .Marland Kitchen.. 1-2 Ouffs Once Daily As Needed 8)  Xalatan 0.005 % Soln (Latanoprost) .Marland Kitchen.. 1drop Right Eye Dailly  Allergies (verified): 1)  ! Pcn  Review of Systems      See HPI       The patient complains of anorexia, fever, dyspnea on exertion, and headaches.  The patient denies weight gain, vision loss, decreased hearing, hoarseness, chest pain, syncope, peripheral edema, hemoptysis, abdominal pain, severe indigestion/heartburn, hematuria, suspicious skin lesions, unusual weight change, abnormal bleeding, enlarged lymph nodes, and angioedema.         Denies syncope, vomiting, hot/swollen joints,   Vital Signs:  Patient profile:   70  year old female Height:      65 inches Weight:      139 pounds BMI:     23.21 O2 Sat:      90 % on Room air Temp:     97.7 degrees F oral Pulse rate:   84 / minute BP sitting:   104 / 68  (left arm) Cuff size:   regular  Vitals Entered By: Reynaldo Minium CMA (July 03, 2010 1:56 PM)  O2 Flow:  Room air CC: Acute visit-recent dx with Double PNA; on 13th day of abx(currently on ugmentin 875 bid ) and 16th day of fevers(using tylenol).   Physical Exam  Additional Exam:  General: A/Ox3; pleasant and cooperative, slender, looks tired SKIN: no rash, lesions NODES: no  lymphadenopathy HEENT: [place]/AT, EOM- WNL, Conjuctivae- clear, PERRLA, TM-WNL, Nose- narrow nasal, Throat- clear and wnl, Mallampati Ii NECK: Supple w/ fair ROM, JVD- none, normal carotid impulses w/o bruits Thyroid- n CHEST: few crackles left chest. No cough or wheeze, no consolidation or rub. 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 Air travel, [employer] [place]  raise questions of Cocci and legionairres. Lack of response to these antibiotics despite leukocytosis raises question of Cryptogenic/ Bronchilitis Obliterans/ BOOP- will check Sed Rate. She is being admitted to medical inpatient bed at [facility]. My group and Dr Felipa Eth have been notified Watch for complaints of headache/ sinus disease and flank pain/  The following medications were removed from the medication list:    Zithromax Z-pak 250 Mg Tabs (Azithromycin) .Marland Kitchen... 2 today then one daily    TLB-BMP (Basic Metabolic Panel-BMET) (80048-METABOL) TLB-CBC Platelet - w/Differential (85025-CBCD) T-Legionella Antigen (Urine) ([id number]) TLB-Udip w/ Micro (81001-URINE) T- * Misc. Laboratory test [id number]) T-2 View CXR (71020TC)  Other Orders: Est. Patient Level III (56213)  Patient Instructions: 1)  If i don't call you tonight- please call me: cell [phone] 2)  lab 3)  A chest x-ray has been recommended.  Your imaging study may require preauthorization.  4)  Finish last augmentin 5)  Fluids and lots of rest- don't fight the fatigue 6)  kaopectate etc if needed for mild diarrhea. If it gets bad, let Dr Demetrius Charity know.

## 2010-07-08 ENCOUNTER — Inpatient Hospital Stay (HOSPITAL_COMMUNITY): Payer: Medicare Other

## 2010-07-08 LAB — COCCIDIOIDES ANTIBODIES: Coccidioides Ab CF: <1:2 {titer}

## 2010-07-08 LAB — COMPREHENSIVE METABOLIC PANEL
ALT: 23 U/L (ref 0–35)
AST: 28 U/L (ref 0–37)
Albumin: 2.1 g/dL — ABNORMAL LOW (ref 3.5–5.2)
CO2: 23 mEq/L (ref 19–32)
Chloride: 109 mEq/L (ref 96–112)
GFR calc Af Amer: >60 mL/min (ref >60)
GFR calc non Af Amer: >60 mL/min (ref >60)
Potassium: 4.1 mEq/L (ref 3.5–5.1)
Sodium: 139 mEq/L (ref 135–145)
Total Bilirubin: 0.3 mg/dL (ref 0.3–1.2)

## 2010-07-08 LAB — CBC
Hemoglobin: 10.6 g/dL — ABNORMAL LOW (ref 12.0–15.0)
RBC: 3.49 MIL/uL — ABNORMAL LOW (ref 3.87–5.11)
WBC: 22.4 10*3/uL — ABNORMAL HIGH (ref 4.0–10.5)

## 2010-07-08 NOTE — Op Note (Signed)
  Chase, Robin             ACCOUNT NO.:  [id number]  MEDICAL RECORD NO.:  [id number]           PATIENT TYPE:  I  LOCATION:  1344                         FACILITY:  [facility]  PHYSICIAN:  Clinton D. Maple Hudson, MD, FCCP, FACPDATE OF BIRTH:  23-Nov-1940  DATE OF PROCEDURE:  07/06/2010 DATE OF DISCHARGE:                              OPERATIVE REPORT   INDICATIONS FOR PROCEDURE:  A 70 year old nonsmoking woman with leukocytosis, diffuse pulmonary infiltrates, failure to respond to broad- spectrum antibiotics, now being evaluated for suspected infection.  DESCRIPTION OF PROCEDURE:  After fully informed consent, bronchoscopy was performed in the endoscopy suite on an inpatient basis.  A video fiberscope was introduced via the right nostril to the level of the vocal cords without difficulty.  Cough was moderately active throughout the procedure.  Vital signs were monitored and remained satisfactory throughout.  Oxygen was provided by nasal prongs and maintained in comfortable physiologic range.  A cumulative dose of 4 mg of Versed and 50 mcg of fentanyl were given intravenously through the procedure for cough control.  The airway was anesthetized topically with lidocaine. The cords, trachea, and carina were unremarkable.  Secretions were thin, watery, and scant.  The bronchial mucosa was unremarkable.  Good visualization was obtained through the 4th division airways in the left lung and 3rd vision airways in the right lung with no remarkable findings.  The mucosa was intact.  With fluoroscopic guidance, saline lavage was performed in the left upper lobe and then brushing in the left upper lobe was performed for an appropriate samples and finally transbronchial lung biopsies were obtained by standard technique in the left upper lobe including inferior lingula.  She tolerated the procedure well with persistent cough throughout as noted.  There was no bleeding and no apparent pneumothorax.   Pending chest x-ray.  She will be held until stable and then returned to her room.  Condition is satisfactory.  FINAL IMPRESSION:  Atypical infection with pneumonia.  Specimens have been requested for very broad culturing with laboratory notified that coccidioidomycosis is a real possibility, so their precautions can avoid contaminating the laboratory if it is sporulates.     Clinton D. Maple Hudson, MD, FCCP, FACP     CDY/MEDQ  D:  07/06/2010  T:  07/07/2010  Job:  [id number]  cc:   Loraine Leriche A. Perini, M.D. Fax: [phone]  Electronically Signed by Jetty Duhamel MD FCCP FACP on 07/08/2010 08:13:02 PM

## 2010-07-09 DIAGNOSIS — I059 Rheumatic mitral valve disease, unspecified: Secondary | ICD-10-CM

## 2010-07-09 LAB — CULTURE, RESPIRATORY W GRAM STAIN
Culture: NO GROWTH
Gram Stain: NONE SEEN

## 2010-07-09 LAB — COCCIDIOIDES ANTIBODIES: Coccidioides Ab CF: <1:2 {titer}

## 2010-07-09 LAB — QUANTIFERON TB GOLD ASSAY (BLOOD): Mitogen Minus Nil Value: 0.23 IU/mL

## 2010-07-10 ENCOUNTER — Other Ambulatory Visit: Payer: Self-pay | Admitting: Internal Medicine

## 2010-07-10 ENCOUNTER — Inpatient Hospital Stay (HOSPITAL_COMMUNITY): Payer: Medicare Other

## 2010-07-10 LAB — CULTURE, BLOOD (ROUTINE X 2)
Culture  Setup Time: 201203170428
Culture: NO GROWTH

## 2010-07-10 LAB — CBC
HCT: 28.8 % — ABNORMAL LOW (ref 36.0–46.0)
MCH: 30.1 pg (ref 26.0–34.0)
MCHC: 33 g/dL (ref 30.0–36.0)
MCV: 91.1 fL (ref 78.0–100.0)
Platelets: 631 10*3/uL — ABNORMAL HIGH (ref 150–400)
RDW: 13.9 % (ref 11.5–15.5)
WBC: 27.1 10*3/uL — ABNORMAL HIGH (ref 4.0–10.5)

## 2010-07-10 LAB — URINALYSIS, ROUTINE W REFLEX MICROSCOPIC
Bilirubin Urine: NEGATIVE
Hgb urine dipstick: NEGATIVE
Ketones, ur: NEGATIVE mg/dL
Specific Gravity, Urine: 1.014 (ref 1.005–1.030)
Urobilinogen, UA: 0.2 mg/dL (ref 0.0–1.0)
pH: 5 (ref 5.0–8.0)

## 2010-07-10 LAB — BLOOD GAS, ARTERIAL
Bicarbonate: 15.2 mEq/L — ABNORMAL LOW (ref 20.0–24.0)
O2 Saturation: 94.7 %
Patient temperature: 98.6
pH, Arterial: 7.39 (ref 7.350–7.400)

## 2010-07-10 LAB — BASIC METABOLIC PANEL
Calcium: 8.3 mg/dL — ABNORMAL LOW (ref 8.4–10.5)
GFR calc Af Amer: 24 mL/min — ABNORMAL LOW (ref >60)
GFR calc non Af Amer: 20 mL/min — ABNORMAL LOW (ref >60)
Glucose, Bld: 163 mg/dL — ABNORMAL HIGH (ref 70–99)
Potassium: 4.4 mEq/L (ref 3.5–5.1)
Sodium: 137 mEq/L (ref 135–145)

## 2010-07-10 LAB — DIFFERENTIAL
Eosinophils Absolute: 0 10*3/uL (ref 0.0–0.7)
Eosinophils Relative: 0 % (ref 0–5)
Lymphocytes Relative: 4 % — ABNORMAL LOW (ref 12–46)
Lymphs Abs: 1 10*3/uL (ref 0.7–4.0)
Monocytes Absolute: 1 10*3/uL (ref 0.1–1.0)

## 2010-07-10 LAB — MAGNESIUM: Magnesium: 2 mg/dL (ref 1.5–2.5)

## 2010-07-11 ENCOUNTER — Inpatient Hospital Stay (HOSPITAL_COMMUNITY): Payer: Medicare Other

## 2010-07-11 LAB — CBC
HCT: 28 % — ABNORMAL LOW (ref 36.0–46.0)
Hemoglobin: 9.3 g/dL — ABNORMAL LOW (ref 12.0–15.0)
MCH: 29.6 pg (ref 26.0–34.0)
MCHC: 33.2 g/dL (ref 30.0–36.0)
MCV: 89.2 fL (ref 78.0–100.0)
RDW: 13.9 % (ref 11.5–15.5)

## 2010-07-11 LAB — C3 COMPLEMENT: C3 Complement: 121 mg/dL (ref 88–201)

## 2010-07-11 LAB — DIFFERENTIAL
Basophils Absolute: 0 10*3/uL (ref 0.0–0.1)
Eosinophils Relative: 0 % (ref 0–5)
Lymphocytes Relative: 6 % — ABNORMAL LOW (ref 12–46)
Lymphs Abs: 1 10*3/uL (ref 0.7–4.0)
Monocytes Absolute: 0.6 10*3/uL (ref 0.1–1.0)
Monocytes Relative: 4 % (ref 3–12)

## 2010-07-11 LAB — LEGIONELLA PROFILE(CULTURE+DFA/SMEAR): Legionella Antigen (DFA): NEGATIVE

## 2010-07-11 LAB — CREATININE, URINE, RANDOM: Creatinine, Urine: 52.4 mg/dL

## 2010-07-11 LAB — COMPREHENSIVE METABOLIC PANEL
BUN: 42 mg/dL — ABNORMAL HIGH (ref 6–23)
CO2: 20 mEq/L (ref 19–32)
Calcium: 8.3 mg/dL — ABNORMAL LOW (ref 8.4–10.5)
Creatinine, Ser: 2.86 mg/dL — ABNORMAL HIGH (ref 0.4–1.2)
GFR calc non Af Amer: 16 mL/min — ABNORMAL LOW (ref >60)
Glucose, Bld: 155 mg/dL — ABNORMAL HIGH (ref 70–99)
Total Protein: 4.8 g/dL — ABNORMAL LOW (ref 6.0–8.3)

## 2010-07-11 LAB — PHOSPHORUS: Phosphorus: 8.9 mg/dL — ABNORMAL HIGH (ref 2.3–4.6)

## 2010-07-11 LAB — C4 COMPLEMENT: Complement C4, Body Fluid: 16 mg/dL (ref 16–47)

## 2010-07-11 LAB — MRSA PCR SCREENING: MRSA by PCR: NEGATIVE

## 2010-07-11 LAB — MAGNESIUM: Magnesium: 2 mg/dL (ref 1.5–2.5)

## 2010-07-12 ENCOUNTER — Inpatient Hospital Stay (HOSPITAL_COMMUNITY): Payer: Medicare Other

## 2010-07-12 DIAGNOSIS — R6889 Other general symptoms and signs: Secondary | ICD-10-CM

## 2010-07-12 DIAGNOSIS — K7689 Other specified diseases of liver: Secondary | ICD-10-CM

## 2010-07-12 LAB — HYPERSENSITIVITY PNUEMONITIS PROFILE
A fumigatus #1: NOT DETECTED
A pullulans: NOT DETECTED
Faenia retivirgula: NOT DETECTED
Thermoactinomyces vulgaris, IgG: NOT DETECTED

## 2010-07-12 LAB — DIFFERENTIAL
Basophils Relative: 0 % (ref 0–1)
Eosinophils Absolute: 0 10*3/uL (ref 0.0–0.7)
Eosinophils Relative: 0 % (ref 0–5)
Monocytes Relative: 5 % (ref 3–12)
Neutrophils Relative %: 88 % — ABNORMAL HIGH (ref 43–77)

## 2010-07-12 LAB — BASIC METABOLIC PANEL
BUN: 51 mg/dL — ABNORMAL HIGH (ref 6–23)
Chloride: 112 mEq/L (ref 96–112)
Creatinine, Ser: 3.03 mg/dL — ABNORMAL HIGH (ref 0.4–1.2)
GFR calc Af Amer: 19 mL/min — ABNORMAL LOW (ref >60)
GFR calc non Af Amer: 15 mL/min — ABNORMAL LOW (ref >60)

## 2010-07-12 LAB — HEPATIC FUNCTION PANEL
Albumin: 2.5 g/dL — ABNORMAL LOW (ref 3.5–5.2)
Alkaline Phosphatase: 181 U/L — ABNORMAL HIGH (ref 39–117)
Indirect Bilirubin: 0.5 mg/dL (ref 0.3–0.9)
Total Protein: 5.3 g/dL — ABNORMAL LOW (ref 6.0–8.3)

## 2010-07-12 LAB — CBC
MCH: 29.3 pg (ref 26.0–34.0)
MCV: 89.1 fL (ref 78.0–100.0)
Platelets: 580 10*3/uL — ABNORMAL HIGH (ref 150–400)
RBC: 3.31 MIL/uL — ABNORMAL LOW (ref 3.87–5.11)
RDW: 14.2 % (ref 11.5–15.5)
WBC: 14.2 10*3/uL — ABNORMAL HIGH (ref 4.0–10.5)

## 2010-07-13 LAB — COMPREHENSIVE METABOLIC PANEL
Albumin: 2.5 g/dL — ABNORMAL LOW (ref 3.5–5.2)
Alkaline Phosphatase: 156 U/L — ABNORMAL HIGH (ref 39–117)
BUN: 60 mg/dL — ABNORMAL HIGH (ref 6–23)
CO2: 25 mEq/L (ref 19–32)
Chloride: 109 mEq/L (ref 96–112)
Creatinine, Ser: 2.75 mg/dL — ABNORMAL HIGH (ref 0.4–1.2)
GFR calc non Af Amer: 17 mL/min — ABNORMAL LOW (ref >60)
Glucose, Bld: 163 mg/dL — ABNORMAL HIGH (ref 70–99)
Potassium: 3.7 mEq/L (ref 3.5–5.1)
Total Bilirubin: 0.5 mg/dL (ref 0.3–1.2)

## 2010-07-13 LAB — COMPLEMENT, TOTAL: Compl, Total (CH50): >60 U/mL — ABNORMAL HIGH (ref 31–60)

## 2010-07-13 LAB — CLOSTRIDIUM DIFFICILE BY PCR: Toxigenic C. Difficile by PCR: NEGATIVE

## 2010-07-13 LAB — MISCELLANEOUS TEST

## 2010-07-13 LAB — PHOSPHORUS: Phosphorus: 6.4 mg/dL — ABNORMAL HIGH (ref 2.3–4.6)

## 2010-07-13 NOTE — Consult Note (Signed)
NAMEAMBAR, RAPHAEL             ACCOUNT NO.:  [id number]  MEDICAL RECORD NO.:  [id number]           PATIENT TYPE:  I  LOCATION:  1344                         FACILITY:  [facility]  PHYSICIAN:  Terrial Rhodes, M.D.DATE OF BIRTH:  13-Sep-1940  DATE OF CONSULTATION:  07/10/2010 DATE OF DISCHARGE:                                CONSULTATION   REASON FOR CONSULTATION:  Acute renal failure.  HISTORY OF PRESENT ILLNESS:  Mrs. Ginther is a 70 year old white female with past medical history significant for allergic rhinitis, was in her fair usual state of health until she returned from a trip in [place].  Three days after her return home, she developed an upper respiratory like illness with cough and fevers, was treated with Z-PAK without any improvement.  Chest x-ray showed bilateral pulmonary infiltrates associated with flank pain into the emergency department and was treated with Avelox.  Her condition continued to worsen and was finally admitted on July 04, 2010 for bilateral pneumonia, also worrisome for opportunistic infection such as coccidioidomycosis since she was in [place].  She underwent bronchoscopy on March 20, biopsy showed eosinophilic pneumonia, no organisms; however, she was started on amphotericin B on March 19 due to concerns of occult infection.  The amphotericin B however was discontinued today because of an acute rise in her serum creatinine which had gone from 0.69 on March 19, 0.77 on March 21 to 2.43 on March 23.  She did not have labs yesterday and her urinalysis when she was brought to the emergency department was negative for blood and protein.  We were asked to further evaluate her acute renal failure, likely due to amphotericin B toxicity. Also of note, she did get a CT angiogram to rule out PE when she presented, however, this was a week ago and was negative for pulmonary embolism.  ALLERGIES:  She has questionable allergy to  PENICILLIN.  PAST MEDICAL HISTORY:  As per HPI: 1. Seasonal allergies and allergic rhinitis. 2. Hyperlipidemia. 3. Hypertension. 4. Eosinophilic pneumonia as above.  MEDICATIONS:  Her outpatient medications were significant for: 1. Diovan HCT 80/12.5. 2. Simvastatin 40 a day. 3. Singulair 10 a day. 4. Zyrtec 10 a day. 5. Premarin 0.3 every other day.  FAMILY HISTORY:  Father died of aortic aneurysm.  Mother died of congestive heart failure.  No family history of kidney disease.  SOCIAL HISTORY:  Never smoked, married to Dr. [name].  This is her second marriage, her first husband died of liver disease.  No IV drug use.  REVIEW OF SYSTEMS:  GENERAL:  She has had some weakness, fatigue, anorexia.  HEENT:  No tinnitus, dysphagia or odynophagia.  CARDIAC:  No chest pain, palpitations, orthopnea.  PULMONARY:  Some shortness of breath.  Dry cough and now with a little bit of hemoptysis.  No PND or orthopnea.  GI:  Has had some anorexia.  No nausea or vomiting.  No hematochezia.  No red blood per rectum.  GU:  No dysuria, pyuria, hematuria, urgency, frequency or retention.  She did have flank pain 2 weeks ago, has resolved.  RHEUMATOLOGIC:  No arthralgias or myalgias.  DERMATOLOGIC:  No rashes, lumps or bumps.  HEMATOLOGIC:  No abnormal bleeding or bruising.  All other systems negative.  PHYSICAL EXAMINATION:  GENERAL:  This is a well-developed and well- nourished female, in mild distress lying in bed. VITAL SIGNS:  Temperature is 97.5, pulse 73, respiratory rate is 32, blood pressure 147/90. HEENT:  Head normocephalic, atraumatic.  Extraocular muscles intact.  No icterus.  Oropharynx without lesions. NECK:  Supple.  No lymphadenopathy or bruits. LUNGS:  She has wheezes and crackles at the left upper lobe, right lower lobe.  She had dullness to percussion at the left lower lobe region, occasional rhonchi with scattered wheezes and crackles bilaterally.  She did have some  blood tinged sputum at her bedside. EXTREMITIES:  She has 1+ presacral edema.  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 NEUROLOGIC EXAM:  Grossly nonfocal.  LABORATORY DATA:  Sodium 137, potassium 4.4, chloride 113, CO2 17, BUN 33, creatinine 2.43, glucose 163, calcium 8.3.  White blood cell count 27.1, hemoglobin 9.5, platelets 631.  Chest x-ray shows persistent right upper lobe air space disease and also increased opacity at the right base from left-sided opacity with pleural effusions.  ASSESSMENT/PLAN: 1. Acute renal failure, nonoliguric, most consistent with amphotericin     B toxicity given the acute nature, lack of urine sediment.  The     drug has already been discontinued and she did not receive a dose     today.  Urinalysis a week ago was negative for blood and protein.     We will recheck her urinalysis today as well as urine electrolytes.     We will also check complements, ANCA, ANA, urine eosinophils given     her nonresponsiveness to both steroids and antifungals, worsening     of her pneumonia and now with her renal failure.  Continue to avoid     amphotericin B, nonsteroidals, COX-2 inhibitors as well as IV     contrast and renal dose medicines.  She is currently without uremic     symptoms. 2. Shortness of breath.  The patient has increasing pleural effusions,     presacral edema, pneumonia.  We will discontinue IV fluids.  We     will check urine electrolytes and then start her on Lasix 40 mg IV.     As it appeared that she has some volume excess, we will also check     an ABG. 3. Metabolic acidosis secondary to acute renal failure.  We will check     an ABG.  Start her on bicarb and follow as treating her metabolic     acidosis may improve her respiratory status. 4. Amphotericin B toxicity.  Follow potassium, mag and renal function.     Continue to hold. 5. Livedo reticularis, these are new, questionable whether or  not this     is a vasculitis.  We will check complement levels, ANCA, etc.  We     will also discuss with pathology and if there is any evidence of     vasculitis on her bronchial biopsy. 6. Anemia.  Question if this is due to acute illness versus pulmonary     hemorrhage versus dilution as she is approximately 6 liters     positive.  Given IV fluids over the last week.  We will continue to     follow and consider     checking peripheral smear to rule out cystitis, although her  platelet count is markedly elevated. 7. Pulmonary:  As above, she is having some shortness of breath.  We     will put her on Lasix, questionable etiology.  Infectious Disease     and Pulmonary is following and we continue to follow.          ______________________________ Terrial Rhodes, M.D.     JC/MEDQ  D:  07/10/2010  T:  07/11/2010  Job:  [id number]  Electronically Signed by Terrial Rhodes M.D. on 07/13/2010 02:55:46 PM

## 2010-07-14 ENCOUNTER — Inpatient Hospital Stay (HOSPITAL_COMMUNITY): Payer: Medicare Other

## 2010-07-14 ENCOUNTER — Encounter: Payer: Self-pay | Admitting: Internal Medicine

## 2010-07-14 LAB — COMPREHENSIVE METABOLIC PANEL
AST: 30 U/L (ref 0–37)
Albumin: 2.5 g/dL — ABNORMAL LOW (ref 3.5–5.2)
BUN: 63 mg/dL — ABNORMAL HIGH (ref 6–23)
Calcium: 8.7 mg/dL (ref 8.4–10.5)
Chloride: 106 mEq/L (ref 96–112)
Creatinine, Ser: 2.46 mg/dL — ABNORMAL HIGH (ref 0.4–1.2)
Total Bilirubin: 0.9 mg/dL (ref 0.3–1.2)
Total Protein: 5 g/dL — ABNORMAL LOW (ref 6.0–8.3)

## 2010-07-14 LAB — PROTEIN ELECTROPH W RFLX QUANT IMMUNOGLOBULINS
Albumin ELP: 46.3 % — ABNORMAL LOW (ref 55.8–66.1)
Alpha-1-Globulin: 10.8 % — ABNORMAL HIGH (ref 2.9–4.9)
Gamma Globulin: 11.1 % (ref 11.1–18.8)
Total Protein ELP: 5.2 g/dL — ABNORMAL LOW (ref 6.0–8.3)

## 2010-07-14 LAB — ANTI-NEUTROPHIL ANTIBODY

## 2010-07-14 LAB — GLOMERULAR BASEMENT MEMBRANE ANTIBODIES

## 2010-07-15 DIAGNOSIS — J82 Pulmonary eosinophilia, not elsewhere classified: Secondary | ICD-10-CM

## 2010-07-15 DIAGNOSIS — J8289 Other pulmonary eosinophilia, not elsewhere classified: Secondary | ICD-10-CM

## 2010-07-15 LAB — COMPREHENSIVE METABOLIC PANEL
ALT: 46 U/L — ABNORMAL HIGH (ref 0–35)
AST: 26 U/L (ref 0–37)
Albumin: 2.6 g/dL — ABNORMAL LOW (ref 3.5–5.2)
CO2: 27 mEq/L (ref 19–32)
Calcium: 8.6 mg/dL (ref 8.4–10.5)
GFR calc Af Amer: 36 mL/min — ABNORMAL LOW (ref >60)
GFR calc non Af Amer: 30 mL/min — ABNORMAL LOW (ref >60)
Sodium: 142 mEq/L (ref 135–145)

## 2010-07-16 ENCOUNTER — Inpatient Hospital Stay (HOSPITAL_COMMUNITY): Payer: Medicare Other

## 2010-07-16 LAB — COMPREHENSIVE METABOLIC PANEL
ALT: 54 U/L — ABNORMAL HIGH (ref 0–35)
AST: 31 U/L (ref 0–37)
Albumin: 2.6 g/dL — ABNORMAL LOW (ref 3.5–5.2)
Alkaline Phosphatase: 120 U/L — ABNORMAL HIGH (ref 39–117)
BUN: 52 mg/dL — ABNORMAL HIGH (ref 6–23)
CO2: 28 mEq/L (ref 19–32)
Calcium: 8.7 mg/dL (ref 8.4–10.5)
Chloride: 107 mEq/L (ref 96–112)
Creatinine, Ser: 1.62 mg/dL — ABNORMAL HIGH (ref 0.4–1.2)
GFR calc Af Amer: 38 mL/min — ABNORMAL LOW (ref >60)
GFR calc non Af Amer: 32 mL/min — ABNORMAL LOW (ref >60)
Glucose, Bld: 140 mg/dL — ABNORMAL HIGH (ref 70–99)
Potassium: 3.7 mEq/L (ref 3.5–5.1)
Sodium: 144 mEq/L (ref 135–145)
Total Bilirubin: 1.3 mg/dL — ABNORMAL HIGH (ref 0.3–1.2)
Total Protein: 4.9 g/dL — ABNORMAL LOW (ref 6.0–8.3)

## 2010-07-16 LAB — CBC
HCT: 29.5 % — ABNORMAL LOW (ref 36.0–46.0)
Hemoglobin: 9.7 g/dL — ABNORMAL LOW (ref 12.0–15.0)
MCH: 29.7 pg (ref 26.0–34.0)
MCHC: 32.9 g/dL (ref 30.0–36.0)
MCV: 90.2 fL (ref 78.0–100.0)
RBC: 3.27 MIL/uL — ABNORMAL LOW (ref 3.87–5.11)

## 2010-07-16 LAB — DIFFERENTIAL
Basophils Relative: 0 % (ref 0–1)
Lymphocytes Relative: 5 % — ABNORMAL LOW (ref 12–46)
Lymphs Abs: 0.5 10*3/uL — ABNORMAL LOW (ref 0.7–4.0)
Monocytes Absolute: 0.5 10*3/uL (ref 0.1–1.0)
Monocytes Relative: 5 % (ref 3–12)
Neutro Abs: 8.7 10*3/uL — ABNORMAL HIGH (ref 1.7–7.7)
Neutrophils Relative %: 91 % — ABNORMAL HIGH (ref 43–77)

## 2010-07-17 ENCOUNTER — Inpatient Hospital Stay (HOSPITAL_COMMUNITY): Payer: Medicare Other

## 2010-07-17 LAB — COMPREHENSIVE METABOLIC PANEL
ALT: 45 U/L — ABNORMAL HIGH (ref 0–35)
Albumin: 2.5 g/dL — ABNORMAL LOW (ref 3.5–5.2)
Alkaline Phosphatase: 106 U/L (ref 39–117)
BUN: 46 mg/dL — ABNORMAL HIGH (ref 6–23)
Chloride: 104 mEq/L (ref 96–112)
Potassium: 3.5 mEq/L (ref 3.5–5.1)
Sodium: 141 mEq/L (ref 135–145)
Total Bilirubin: 1.3 mg/dL — ABNORMAL HIGH (ref 0.3–1.2)
Total Protein: 4.7 g/dL — ABNORMAL LOW (ref 6.0–8.3)

## 2010-07-17 LAB — DIFFERENTIAL
Basophils Absolute: 0 10*3/uL (ref 0.0–0.1)
Eosinophils Absolute: 0 10*3/uL (ref 0.0–0.7)
Eosinophils Relative: 0 % (ref 0–5)
Lymphocytes Relative: 6 % — ABNORMAL LOW (ref 12–46)
Lymphs Abs: 0.7 10*3/uL (ref 0.7–4.0)
Neutrophils Relative %: 81 % — ABNORMAL HIGH (ref 43–77)

## 2010-07-17 LAB — CBC
HCT: 29 % — ABNORMAL LOW (ref 36.0–46.0)
MCV: 90.6 fL (ref 78.0–100.0)
Platelets: 337 10*3/uL (ref 150–400)
RBC: 3.2 MIL/uL — ABNORMAL LOW (ref 3.87–5.11)
RDW: 14.6 % (ref 11.5–15.5)
WBC: 12.3 10*3/uL — ABNORMAL HIGH (ref 4.0–10.5)

## 2010-07-23 LAB — MISCELLANEOUS TEST

## 2010-07-28 ENCOUNTER — Other Ambulatory Visit: Payer: Self-pay | Admitting: Internal Medicine

## 2010-07-28 DIAGNOSIS — J189 Pneumonia, unspecified organism: Secondary | ICD-10-CM

## 2010-07-31 ENCOUNTER — Ambulatory Visit (INDEPENDENT_AMBULATORY_CARE_PROVIDER_SITE_OTHER)
Admission: RE | Admit: 2010-07-31 | Discharge: 2010-07-31 | Disposition: A | Discharge status: Home / Self Care | Payer: Medicare Other | Source: Ambulatory Visit | Attending: Internal Medicine | Admitting: Internal Medicine

## 2010-07-31 ENCOUNTER — Encounter: Payer: Self-pay | Admitting: Internal Medicine

## 2010-07-31 ENCOUNTER — Ambulatory Visit (INDEPENDENT_AMBULATORY_CARE_PROVIDER_SITE_OTHER): Payer: Medicare Other | Admitting: Internal Medicine

## 2010-07-31 VITALS — BP 110/70 | HR 93 | Ht 65.0 in | Wt 127.2 lb

## 2010-07-31 DIAGNOSIS — J189 Pneumonia, unspecified organism: Secondary | ICD-10-CM

## 2010-07-31 DIAGNOSIS — J8281 Chronic eosinophilic pneumonia: Secondary | ICD-10-CM

## 2010-07-31 DIAGNOSIS — J8289 Other pulmonary eosinophilia, not elsewhere classified: Secondary | ICD-10-CM

## 2010-07-31 MED ORDER — FLUCONAZOLE 200 MG PO TABS: 200.0000 mg | ORAL_TABLET | Freq: Every day | ORAL | Status: AC

## 2010-07-31 MED ORDER — FIRST-DUKES MOUTHWASH MT SUSP: OROMUCOSAL | Status: DC

## 2010-07-31 NOTE — Patient Instructions (Addendum)
It is ok to just use the oxygen when you feel you need it or to keep O2 saturation arbitrarily above 91-92%  Gradually increase your walking and physical activity as you feel able.   Resume Asmanex 1 puff and then brush teeth, twice daily  Step down prednisone by 10 mg daily per week. We can modify that a little, by taking an extra 5 mg some days if you feel particularly weak, short of breath or if cough gets worse.   Script for Diflucan to continue until you are seen for Infectious Disease follow-up  Stay off simvastatin while you are on Diflucan  Script sent for magic mouthwash

## 2010-07-31 NOTE — Assessment & Plan Note (Signed)
On prednisone 40 mg/ day since discharge. We discussed steroid withdrawal. Agreed to taper by 10 mg/ week. Appointment pending ID/ Dr Daiva Eves for serology f/u. Will continue Diflucan till then.  Explained distinction between dyspnea due to pulmonary insufficiency and due to deconditioning/ steroids. To gradually increase activity.  O2 saturation 99% a few minutes off portable O2 here at rest. Her own oximeter recording no less than 91%. Agreed to try off O2, but not turn it in until we see how she does with steroid taper.  Renal insufficiency and the hyperglycemia of steroids to be f/u by Dr Waynard Edwards.

## 2010-07-31 NOTE — Progress Notes (Signed)
  Subjective:    Patient ID: [name], female    DOB: 02/14/1941, 70 y.o.   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 Dx'd by [facility]. All cultures and serologies were negative. Final convalescent fungal serologies and 6 week culture finals are pending. Hospital course significant for stabilization of pneumonia on steroids/ Diflucan. Rapid onset of renal insufficiency noted on amphotericin, consulted by Dr Abel Presto.  CXR today shows near complete clearing. Room air sat is now 99%.  Still easily fatigued. Saturations drop no lower than 91%. Minor cough, nonproductive.Denies pain, fever, sweats, nodes.    Review of Systems See HPI Constitutional:   No weight loss, night sweats,  Fevers, chills, fatigue, lassitude. HEENT:   No headaches,  Difficulty swallowing,  Tooth/dental problems,  Sore throat,                No sneezing, itching, ear ache, nasal congestion                         Minor post nasal drip,   CV:  No chest pain,  Orthopnea, PND, swelling in lower extremities, anasarca, dizziness, palpitations  GI  No heartburn, indigestion, abdominal pain, nausea, vomiting, diarrhea, change in bowel habits, loss of appetite  Resp:Easy shortness of breath with exertion No excess mucus, no productive cough,  Minor  non-productive cough,  No coughing up of blood.  No change in color of mucus.  No wheezing.    Skin: no rash or lesions.  GU: no dysuria, change in color of urine, no urgency or frequency.  No flank pain.  MS:  No joint pain or swelling.  No decreased range of motion.  No back pain.  Psych:  No change in mood or affect. No depression or anxiety.  No memory loss.      Objective:   Physical Exam General- Alert, Oriented, Affect-appropriate, Distress- none acute  Looks tired  Skin- rash-none, lesions- none, excoriation- none  Lymphadenopathy- none  Head- atraumatic  Eyes- Gross  vision intact, PERRLA, conjunctivae clear secretions  Ears- Hearing Normal canals, Tm L ,R ,  Nose- Clear,  No-septal dev, mucus, polyps, erosion, perforation   Throat- Mallampati II , mucosa clear , drainage- none, tonsils- atrophic  Neck- flexible , trachea midline, no stridor , thyroid nl, carotid no bruit  Chest - symmetrical excursion , unlabored     Heart/CV- RRR , no murmur , no gallop  , no rub, nl s1 s2                     - JVD- none , edema- none, stasis changes- none, varices- none     Lung- clear to P&A, wheeze- none, cough- none , dullness-none, rub- none     Chest wall- Abd- tender-no, distended-no, bowel sounds-present, HSM- no  Br/ Gen/ Rectal- Not done, not indicated  Extrem- cyanosis- none, clubbing, none, atrophy- none, strength- nl  Neuro- grossly intact to observation         Assessment & Plan:

## 2010-08-03 LAB — FUNGUS CULTURE W SMEAR
Fungal Smear: NONE SEEN
Fungal Smear: NONE SEEN

## 2010-08-05 LAB — FUNGUS CULTURE W SMEAR: Fungal Smear: NONE SEEN

## 2010-08-07 ENCOUNTER — Encounter: Payer: Self-pay | Admitting: Internal Medicine

## 2010-08-12 ENCOUNTER — Ambulatory Visit (INDEPENDENT_AMBULATORY_CARE_PROVIDER_SITE_OTHER): Payer: Medicare Other | Admitting: Infectious Disease

## 2010-08-12 VITALS — BP 130/78 | HR 90 | Temp 98.1°F | Wt 125.0 lb

## 2010-08-12 DIAGNOSIS — J8289 Other pulmonary eosinophilia, not elsewhere classified: Secondary | ICD-10-CM

## 2010-08-12 DIAGNOSIS — N19 Unspecified kidney failure: Secondary | ICD-10-CM | POA: Insufficient documentation

## 2010-08-12 DIAGNOSIS — B382 Pulmonary coccidioidomycosis, unspecified: Secondary | ICD-10-CM | POA: Insufficient documentation

## 2010-08-12 DIAGNOSIS — K759 Inflammatory liver disease, unspecified: Secondary | ICD-10-CM | POA: Insufficient documentation

## 2010-08-12 DIAGNOSIS — J8281 Chronic eosinophilic pneumonia: Secondary | ICD-10-CM

## 2010-08-12 NOTE — Progress Notes (Signed)
Subjective:    Patient ID: [name], female    DOB: November 14, 1940, 70 y.o.   MRN: [id number]  HPI   70 year old lady with past medical history significant for multiple allergies who I saw on the infectious disease consult service in March of 2012 it [facility]. She had returned from a visit in the [address] with her husband in late February and had had flulike symptoms of fevers chills and malaise. She was then diagnosed with community acquired and nontender but failed multiple courses of antibiotics including therapy with Augmentin. She was admitted to the hospital with severe progressive pulmonary symptoms and on resolving pneumonia. There was great concern for Coccidioides... infection on the basis of her exposure history and an air presentation. Of note in the interim she developed peripheral eosinophilia. She had aggressive workup in the hospital including bronchoscopy with transbronchial biopsy. The biopsy did not show any evidence of Coccidioides infection and polymerase chain reaction sampling from the tissue failed to reveal Coccidioides or other fungal organisms. Fungal cultures taken from the biopsy site and from bronchial alveolar lavages failed to grow any organisms. Instead the pathology seemed highly consistent with eosinophilic pneumonia. The patient had initially started on intravenous amphotericin due to concerns of severe Coccidioides infection. After the biopsy she was then also started on high-dose corticosteroids. She improved with corticosteroid steroid therapy. Her course was complicated by amphotericin-induced nephrotoxicity and the patient was seen by nephrology. We changed her over to fluconazole and she was ultimately discharged in the hospital after improved with corticosteroids but also being on steroids the same time. Of note her Coccidioides and minute images antibodies that were sent to [facility] as well as the [facility] [place] in  [place] laboratory were negative for Coccidioides by complement fixation antibody standby immunodiffusion antibodies. Additionally her Coccidioides images antigen in serum sent [facility] was also negative. Since hospital discharge she is improved dramatically and she's been seen by Dr. Maple Hudson. Her followup chest x-ray shows near resolution of her diffuse bilateral infiltrates. Ears hair symptoms have improved dramatically and she feels less dyspneic when climbing stairs. She was at she is without fevers and she is regaining her strength. She was out without nausea. She does have some mild weakness in her proximal muscles of her thigh. Her renal function has normalized. She has been on fluconazole at renally adjusted dose of 200 mg per day. We discussed sending repeat serologies out to [facility] and continue fluconazole although the patient wanted to stay on the lower dose of fluconazole. Of note there had been some concern about hepatotoxicity associated with fluconazole. She did have mild transaminase elevation in alkaline phosphatase elevation but I do not think this is likely related to her fluconazole. Her husband and she asked if she should ever return to the [employer]. They apparently a time share r. Certainly I would be very cautious about returning there and I would make sure that she has prednisone on hand should he return as is not clear what precipitated her eosinophilic pneumonia.  Review of Systems As in history present illness otherwise 12 point review of systems is negative     Objective:   Physical Exam Patient alert I. x4. HEENT is normocephalic atraumatic pupils are equal round reactive to light sclera anicteric oropharynx is clear without exudate or lesions. Neck was supple cardiac exam revealed irregular rhythm no murmurs rubs or rubs were heard lungs were clear to auscultation bilaterally abdomen soft nondistended  nontender. Extremities are without edema. No  rashes or skin lesions. Neurologic exam was nonfocal with strength symmetric gait normal thought processes linear linear. Patient's mood was was normal and she was in good spirits.        Assessment & Plan:  Eosinophilic pneumonia She is improving and her prednisone is being tapered.  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 Therefore we will set send repeat serologies and continue her on fluconazole albeit at a lower dose until we have final serologies about 8 weeks from her initial presentation.  Hepatitis Her liver function test elevations were minor and may or may not been related to fluconazole and skeptical that they were do to fluconazole.  Renal failure Due to amphotericin. It  has resolved.

## 2010-08-12 NOTE — Patient Instructions (Signed)
WE WILL CHECK  LABS FOR COCCI AT [facility] AND IF THESE ARE NEGATIVE ASK YOU TO STOP YOUR FLUCONAZOLE

## 2010-08-12 NOTE — Assessment & Plan Note (Signed)
Due to amphotericin. It  has resolved.

## 2010-08-12 NOTE — Assessment & Plan Note (Signed)
Her liver function test elevations were minor and may or may not been related to fluconazole and skeptical that they were do to fluconazole.

## 2010-08-12 NOTE — Assessment & Plan Note (Signed)
She is improving and her prednisone is being tapered.

## 2010-08-12 NOTE — Assessment & Plan Note (Signed)
We are actually skeptical that she has Coccidioides infection but the exposure risk was high and the presentation was concerning for Coccidioides. Therefore we will set send repeat serologies and continue her on fluconazole albeit at a lower dose until we have final serologies about 8 weeks from her initial presentation.

## 2010-08-14 ENCOUNTER — Ambulatory Visit (INDEPENDENT_AMBULATORY_CARE_PROVIDER_SITE_OTHER): Payer: Medicare Other | Admitting: Internal Medicine

## 2010-08-14 ENCOUNTER — Encounter: Payer: Self-pay | Admitting: Internal Medicine

## 2010-08-14 VITALS — BP 122/76 | HR 73 | Wt 126.8 lb

## 2010-08-14 DIAGNOSIS — K759 Inflammatory liver disease, unspecified: Secondary | ICD-10-CM

## 2010-08-14 DIAGNOSIS — J309 Allergic rhinitis, unspecified: Secondary | ICD-10-CM

## 2010-08-14 DIAGNOSIS — N19 Unspecified kidney failure: Secondary | ICD-10-CM

## 2010-08-14 DIAGNOSIS — B382 Pulmonary coccidioidomycosis, unspecified: Secondary | ICD-10-CM

## 2010-08-14 DIAGNOSIS — J8289 Other pulmonary eosinophilia, not elsewhere classified: Secondary | ICD-10-CM

## 2010-08-14 DIAGNOSIS — J8281 Chronic eosinophilic pneumonia: Secondary | ICD-10-CM

## 2010-08-14 MED ORDER — FLUCONAZOLE 200 MG PO TABS: 200.0000 mg | ORAL_TABLET | Freq: Every day | ORAL | Status: AC

## 2010-08-14 MED ORDER — PREDNISONE 5 MG PO TABS: ORAL_TABLET | ORAL | Status: DC

## 2010-08-14 NOTE — Assessment & Plan Note (Addendum)
Steady improvement. I don't see need for CXR today. Discussions of steroid withdrawal strategy and warning signs. We will follow slow steroid taper advised by Dr Cheryll Cockayne- a very experienced endocrinologist. Will continue Diflucan till final convalsesent fungal titers return.

## 2010-08-14 NOTE — Progress Notes (Signed)
  Subjective:    Patient ID: [name], female    DOB: [birth year], 70 y.o.   MRN: [id number]  HPI 70 yoF never smoker, now in f/u tapering slowly off prednisone and still on diflucan after eosinophilic pneumonia, with original concern for fungal pneumonia. Husband is retired endocrinologist and we discussed best strategy for prednisone.  She is back to baseline minor cough as she lies down, and not short of breath, no longer hearing any wheeze. Strength is coming back. Denies fever, sweats, nodes, rash. All serologies and cultures for routine. AFB and fungal organisms continue negative.    Review of Systems See HPI Constitutional:   No weight loss, night sweats,  Fevers, chills, fatigue, lassitude. HEENT:   No headaches,  Difficulty swallowing,  Tooth/dental problems,  Sore throat,                No sneezing, itching, ear ache, nasal congestion, post nasal drip,   CV:  No chest pain,  Orthopnea, PND, swelling in lower extremities, anasarca, dizziness, palpitations  GI  No heartburn, indigestion, abdominal pain, nausea, vomiting, diarrhea, change in bowel habits, loss of appetite  Resp: No shortness of breath with exertion or at rest.  No excess mucus, no productive cough,  No non-productive cough,  No coughing up of blood.  No change in color of mucus.  No wheezing.  Skin: no rash or lesions.  GU: no dysuria, change in color of urine, no urgency or frequency.  No flank pain.  MS:  No joint pain or swelling.  No decreased range of motion.  No back pain.  Psych:  No change in mood or affect. No depression or anxiety.  No memory loss.      Objective:   Physical Exam General- Alert, Oriented, Affect-appropriate, Distress- none acute   Mildly cushingoid facies  Skin- rash-none, lesions- none, excoriation- none  Lymphadenopathy- none  Head- atraumatic       Steroid facial pufiness  Eyes- Gross vision intact, PERRLA, conjunctivae clear secretions  Ears- Normal -  Hearing, canals,  Nose- Clear,  No-septal dev, mucus, polyps, erosion, perforation   Throat- Mallampati II , mucosa clear , drainage- none, tonsils- atrophic         Minimal yeasty coating  Neck- flexible , trachea midline, no stridor , thyroid nl, carotid no bruit  Chest - symmetrical excursion , unlabored     Heart/CV- RRR , no murmur , no gallop  , no rub, nl s1 s2                     - JVD- none , edema- none, stasis changes- none, varices- none     Lung- clear to P&A, wheeze- none, cough- none , dullness-none, rub- none     Chest wall-  Abd- tender-no, distended-no, bowel sounds-present, HSM- no  Br/ Gen/ Rectal- Not done, not indicated  Extrem- cyanosis- none, clubbing, none, atrophy- none, strength- nl  Neuro- grossly intact to observation         Assessment & Plan:

## 2010-08-14 NOTE — Patient Instructions (Signed)
Prednisone taper, using 5 mg tabs with new script sent:  10 mg/ 5 mg x 1 week  7.5 mg/ 5 mg x 1 week   5 mg/ 5 mg x 1 week   5 mg/ 2.5 mg x 1 week   Then 2.5 mg daily x 4 months  Diflucan refilled to stay on until the final fungal titers are back.

## 2010-08-18 ENCOUNTER — Encounter: Payer: Self-pay | Admitting: Internal Medicine

## 2010-08-18 LAB — AFB CULTURE WITH SMEAR (NOT AT ARMC): Acid Fast Smear: NONE SEEN

## 2010-08-18 NOTE — Assessment & Plan Note (Signed)
She is beginning to describe the mild recurrent symptoms we have seen in the past with her rhinitis, but well controlled with current meds.

## 2010-08-19 LAB — AFB CULTURE WITH SMEAR (NOT AT ARMC): Acid Fast Smear: NONE SEEN

## 2010-08-19 NOTE — Progress Notes (Signed)
Quick Note:  Spoke with patient-aware of results. ______ 

## 2010-08-20 NOTE — Discharge Summary (Signed)
NAMESHONTA, BOURQUE             ACCOUNT NO.:  [id number]  MEDICAL RECORD NO.:  [id number]           PATIENT TYPE:  I  LOCATION:  1308                         FACILITY:  [facility]  PHYSICIAN:  Makaiya Geerdes D. Maple Hudson, MD, FCCP, FACPDATE OF BIRTH:  07-14-40  DATE OF ADMISSION:  07/03/2010 DATE OF DISCHARGE:  07/17/2010                              DISCHARGE SUMMARY   FINAL DIAGNOSES: 1. Eosinophilic pneumonia. 2. Acute respiratory failure secondary to eosinophilic pneumonia. 3. Acute renal failure (resolved). 4. Hypokalemia. 5. Hypertension. 6. Hyperglycemia. 7. Oral candidiasis/thrush.  CONSULTANTS: 1. Dr. Paulette Blanch Dam, with Infectious Disease. 2. Dr. Terrial Rhodes with Nephrology. 3. Dr. Rodrigo Ran with Internal Medicine  PROCEDURES: 1. PICC line.  This was placed on March 21 and removed March 30. 2. Bronchoscopy.  This was carried out on July 06, 2010, by Dr.     Maple Hudson.  LABORATORY DATA/MICRO DATA:  July 17, 2010, sodium 141, potassium 3.5, chloride 104, CO2 29, glucose 108, BUN 46, creatinine 1.34, GFR was 39 milliliters per minute.  White blood cell count 12.3, hemoglobin 9.4, hematocrit 29, platelet count 337.  Sputum culture on July 08, 2010, demonstrated yeast consistent with Candida species (this is a preliminary finding).  Protein electrophoresis from serum demonstrated a total protein of 5.2, serum albumin of 46.3, alpha-1 was 10.8, alpha-2 was 19.7, beta serum was 7.1, beta-2 was 5, gamma globulin was 11.1. The M-spike was not detected.  CH50 was greater than 60.  Coccidioides antibody was negative, this was on July 16, 2010.  Hypersensitivity pneumonitis panel from the blood on July 05, 2010, Aspergillus not detected, Aureobasidium pullulans not detected.  Thermoactinomyces vulgaris not detected.  Faenia rectivirgula was not detected. Cephalosporium not detected.  Aspergillus flavus not detected. Aspergillus [place] not detected.  Legionella was  negative from bronchioalveolar lavage.  Streptolysin O antibodies were 36.  C4 complement was 16.  C3 complement was 121.  These were both done on July 10, 2010.  Sputum culture March 21 demonstrated normal oropharyngeal type flora.  QuantiFERON Gold was indeterminate.  AFBs were negative prelim on July 08, 2010.  Pneumocystis jiroveci July 06, 2010 was negative.  Cryptococcal antigen was negative on July 06, 2010. Fungal cultures March 19 was negative.  ESR July 03, 2010, was 85.  IgE on July 05, 2010, was 873.9.  BRIEF HISTORY:  [name] is a 70 year old white female with past medical history of allergic rhinitis who reported to be in her normal state of health up until recent trip to [facility].  Three days after her return, she developed upper respiratory tract like infection, symptoms with cough and fever.  She was initially treated with Z-PAK without improvement.  Chest x-ray showed bilateral pulmonary infiltrates with associated flank pain.  She was seen in the emergency room and treated with Avelox.  However, her condition continued to worsen, ultimately required hospital admission with a diagnosis of bilateral pneumonia on July 04, 2010.  Given her recent history of [place], there was concern about fungal infection.  She underwent bronchoscopy on the 19th which was negative for organisms.  However, findings were felt to  be consistent with an eosinophilic type process. Ultimately, she remained under Pulmonary Service until from date of hospital admit from March 16 to discharge March 30 being treated for acute respiratory failure due to eosinophilic pneumonia.  See hospital course by discharge diagnoses below.  HOSPITAL COURSE: 1. Hypoxic respiratory failure secondary to bilateral infiltrates and     presumed eosinophilic pneumonia.  Ms. Cashaw was admitted initially     to the regular medical ward.  She did require a transfer to step-     down  unit for closer monitoring during this hospitalization     secondary to hypoxemia.  Ultimately, she had serial chest x-rays     demonstrating bilateral pulmonary infiltrates.  These have improved     marginally over the course of her hospitalization.  During     hospitalization, she underwent bronchoscopy with findings as noted     above.  She was placed additionally on amphotericin with thought of     possible fungal infection.  Unfortunately, with amphotericin, she     did develop acute renal failure necessitating transition from     amphotericin to Diflucan.  From an Infectious Disease standpoint,     she was followed by [facility], and she was     initially placed on IV vancomycin and Zosyn on hospital admit day.     This was discontinued on July 05, 2010, after Infectious Disease     evaluation.  Ultimately, she was placed on amphotericin B on July 06, 2010, as mentioned above.  Following this, on July 07, 2010,     was also placed on IV Solu-Medrol initially 80 mg 3 times a day.     This was later transitioned to oral prednisone.  She has now been     titrated down to oral prednisone at 40 mg total dose daily.     Additionally, following the acute renal failure secondary to     amphotericin, she was placed on fluconazole initially for 400 mg     dosing.  This has now been changed to 200 mg daily.  Based on her     creatinine clearance, there is some planning for potential dosing     back to 400 mg; however, this will be managed by Dr. Daiva Eves in the     Infectious Disease program.  Ultimately, Ms. Huseby has responded     clinically and mildly radiographically with a combination of     Diflucan and steroids.  She will be discharged to home with     recommendations and planning to continue Diflucan with further     dosing to be managed by Dr. Daiva Eves, and prednisone with tapering     dosing to be managed by Dr. Maple Hudson.  We will follow up with her in     the  outpatient clinic both radiographically and clinically.  Of     note, she does have exertional hypoxia with saturations down to 88     range with exertion.  She will therefore be discharged to home on 2     L nasal cannula. 2. Acute renal failure secondary to amphotericin.  Her peak creatinine     did rise above 3.  This is now down to 1.3 region.  She has rapidly     improved after cessation of amphotericin. 3. Hypokalemia.  For this, she will continue supplemental potassium. 4. Hypertension.  For this, she will be  discharged to home on     amlodipine.  Dr. Waynard Edwards will follow with this. 5. Hyperglycemia.  This is secondary initially to high dose steroids     and this has improved.  Dr. Waynard Edwards will follow. 6. Oral thrush.  For this, she will continue systemic Diflucan and     symptomatically will continue Magic Mouthwash.  A prescription has     been given.  She has been instructed to discontinue her Singulair     and simvastatin.  DISCHARGE MEDICATIONS: 1. Tylenol 500 mg 1 tablet at bedtime as needed. 2. Amlodipine 5 mg daily. 3. Fluconazole 200 mg at bedtime. 4. Potassium chloride 40 mEq daily. 5. Prednisone 10 mg tablet, instructed to take 2 tablets twice a day     with meals. 6. Magic Mouthwash 1 teaspoon swish and swallow 4 times a day. 7. Asmanex 1-2 puffs inhaled daily as needed. 8. Nasacort Aqua 1-2 sprays nasally as needed. 9. Premarin 1 tablet daily. 10.Xalatan 0.005% ophthalmic 1 drop right eye at bedtime. 11.Zyrtec 10 mg every morning.  She is instructed to discontinue simvastatin, Diovan hydrochlorothiazide, and Singulair.  FOLLOWUP:  Her followup appointments will be with Dr. Jetty Duhamel Friday April 13 at 10:45.  She will be contacted by Dr. Lorcan Shelp [place] office and he will see her in approximately 2 weeks.  Additionally, she will see Dr. Waynard Edwards in routine followup and they will make this appointment.  DISPOSITION:  Ms. Sipe has met maximum benefit from inpatient  stay. She is now medically cleared for discharge with followup as noted.  She will be discharged to home as mentioned above on supplemental oxygen at 2 L all times and prednisone with instructions to continue current dosing as well as Diflucan.     Zenia Resides, NP   ______________________________ Rennis Chris. Maple Hudson, MD, FCCP, FACP    PB/MEDQ  D:  07/17/2010  T:  07/17/2010  Job:  [id number]  cc:   Loraine Leriche A. Perini, M.D. Fax: [phone]  Acey Lav, MD Fax: [phone]  Ashanna Heinsohn D. Maple Hudson, MD, FCCP, FACP [facility]-Pulmonary Dept [address] [place] [address]  Electronically Signed by Zenia Resides NP on 08/03/2010 03:18:44 PM Electronically Signed by Jetty Duhamel MD FCCP FACP on 08/20/2010 08:49:01 PM

## 2010-09-04 ENCOUNTER — Encounter: Payer: Self-pay | Admitting: Infectious Disease

## 2010-09-07 ENCOUNTER — Encounter: Payer: Self-pay | Admitting: Internal Medicine

## 2010-09-15 ENCOUNTER — Encounter: Payer: Self-pay | Admitting: Internal Medicine

## 2010-09-15 ENCOUNTER — Ambulatory Visit (INDEPENDENT_AMBULATORY_CARE_PROVIDER_SITE_OTHER): Payer: Medicare Other | Admitting: Internal Medicine

## 2010-09-15 VITALS — BP 120/66 | HR 80 | Ht 65.0 in | Wt 128.6 lb

## 2010-09-15 DIAGNOSIS — J8289 Other pulmonary eosinophilia, not elsewhere classified: Secondary | ICD-10-CM

## 2010-09-15 DIAGNOSIS — J8281 Chronic eosinophilic pneumonia: Secondary | ICD-10-CM

## 2010-09-15 DIAGNOSIS — J40 Bronchitis, not specified as acute or chronic: Secondary | ICD-10-CM

## 2010-09-15 NOTE — Patient Instructions (Signed)
Prednisone- This is arbitrary- I suggest you take 5 mg every other day for a week then stop.   Dulera sample from Dr Eloise Harman- suggest you use this up, 2 puffs and rinse mouth every other day. Then go back to your Asmanex.

## 2010-09-15 NOTE — Assessment & Plan Note (Addendum)
Recent incidental cold has left her still a little raspy. I am suggesting  she finish the [facility] sample since she has it, then try going back to Asmanex.

## 2010-09-15 NOTE — Progress Notes (Signed)
Subjective:    Patient ID: [name], female    DOB: Mar 15, 1941, 70 y.o.   MRN: [id number]  HPI    Review of Systems     Objective:   Physical Exam        Assessment & Plan:   Subjective:    Patient ID: [name], female    DOB: 09-16-1940, 70 y.o.   MRN: [id number]  HPI 08/14/10-  89 yoF never smoker, now in f/u tapering slowly off prednisone and still on diflucan after eosinophilic pneumonia, with original concern for fungal pneumonia. Husband is retired endocrinologist and we discussed best strategy for prednisone.  She is back to baseline minor cough as she lies down, and not short of breath, no longer hearing any wheeze. Strength is coming back. Denies fever, sweats, nodes, rash. All serologies and cultures for routine. AFB and fungal organisms continue negative.   09/15/10- Hx eosinophilic pneumonia. Allergic rhinitis Had a cold 1 week ago, with raspy cough and nasal congestion. Her primary office checked CXR since her prednisone had tapered down to 5 mg. Husband now has the same thing and they feel it is just a cold. Has now been on prednisone at 5 mg daily dose for 1 week.  I reviewed images- clear. She went back on Diovan for BP and is tolerating that. Dr Eloise Harman gave sample script Elwin Sleight to try instead of Asmanex while she was tight with her cold. She asks what to do with that.    Review of Systems See HPI Constitutional:   No weight loss, night sweats,  Fevers, chills, fatigue, lassitude. HEENT:   No headaches,  Difficulty swallowing,  Tooth/dental problems,  Sore throat,                No sneezing, itching, ear ache,  CV:  No chest pain,  Orthopnea, PND, swelling in lower extremities, anasarca, dizziness, palpitations  GI  No heartburn, indigestion, abdominal pain, nausea, vomiting, diarrhea, change in bowel habits, loss of appetite  Resp: No shortness of breath with exertion or at rest.  No excess mucus, ,  No coughing up of blood.  No  change in color of mucus.  No wheezing.  Skin: no rash or lesions.  GU: no dysuria, change in color of urine, no urgency or frequency.  No flank pain.  MS:  No joint pain or swelling.  No decreased range of motion.  No back pain.  Psych:  No change in mood or affect. No depression or anxiety.  No memory loss.      Objective:   Physical Exam General- Alert, Oriented, Affect-appropriate, Distress- none acute   Mildly cushingoid facies  Skin- rash-none, lesions- none, excoriation- none  Lymphadenopathy- none  Head- atraumatic       Steroid facial pufiness  Eyes- Gross vision intact, PERRLA, conjunctivae clear secretions  Ears- Normal - Hearing, canals,  Nose- Clear,  No-septal dev, mucus, polyps, erosion, perforation   Throat- Mallampati II , mucosa clear , drainage- none, tonsils- atrophic          Neck- flexible , trachea midline, no stridor , thyroid nl, carotid no bruit  Chest - symmetrical excursion , unlabored     Heart/CV- RRR , no murmur , no gallop  , no rub, nl s1 s2                     - JVD- none , edema- none, stasis changes- none, varices- none  Lung- Coarse sounds,  wheeze- none, cough- none , dullness-none, rub- none     Chest wall-  Abd- tender-no, distended-no, bowel sounds-present, HSM- no  Br/ Gen/ Rectal- Not done, not indicated  Extrem- cyanosis- none, clubbing, none, atrophy- none, strength- nl  Neuro- grossly intact to observation         Assessment & Plan:

## 2010-09-15 NOTE — Assessment & Plan Note (Signed)
I don't expect to see a rebound. We have discussed how to finish up with the prednisone and what to expect from steroid withdrawal.

## 2010-09-16 ENCOUNTER — Encounter: Payer: Self-pay | Admitting: Infectious Disease

## 2010-10-09 ENCOUNTER — Telehealth: Payer: Self-pay | Admitting: Internal Medicine

## 2010-10-09 NOTE — Telephone Encounter (Signed)
Spoke with pt's [place]. [place], was taken to the doctor there and had cxr- dxed with bilateral infiltrates, WBC 20,000. She was started abx and the doc there rec that she start back on larger doses of pred. [name] spouse who is a physician, felt that this was logical, but wanted to know what CDY thought. I advised CDY is off today. He verbalized understanding. I advised that we have openings next wk, so pt could be seen then when they return if this made him feel better. He states appt here for followup sounds great and so sched pt to see CDY on 10/15/10 at 4 pm. Will forward msg to CDY so he is aware.

## 2010-10-14 ENCOUNTER — Encounter: Payer: Self-pay | Admitting: Internal Medicine

## 2010-10-15 ENCOUNTER — Encounter: Payer: Self-pay | Admitting: Internal Medicine

## 2010-10-15 ENCOUNTER — Ambulatory Visit (INDEPENDENT_AMBULATORY_CARE_PROVIDER_SITE_OTHER): Payer: Medicare Other | Admitting: Internal Medicine

## 2010-10-15 VITALS — BP 134/82 | HR 80 | Ht 65.0 in | Wt 132.6 lb

## 2010-10-15 DIAGNOSIS — J328 Other chronic sinusitis: Secondary | ICD-10-CM

## 2010-10-15 DIAGNOSIS — N19 Unspecified kidney failure: Secondary | ICD-10-CM

## 2010-10-15 DIAGNOSIS — J40 Bronchitis, not specified as acute or chronic: Secondary | ICD-10-CM

## 2010-10-15 DIAGNOSIS — J8281 Chronic eosinophilic pneumonia: Secondary | ICD-10-CM

## 2010-10-15 DIAGNOSIS — J8289 Other pulmonary eosinophilia, not elsewhere classified: Secondary | ICD-10-CM

## 2010-10-15 MED ORDER — POTASSIUM CHLORIDE 10 MEQ PO TBCR: 10.0000 meq | EXTENDED_RELEASE_TABLET | Freq: Two times a day (BID) | ORAL | Status: DC

## 2010-10-15 NOTE — Patient Instructions (Addendum)
Suggest dropping prednisone by 10 mg every 5 days.  When you get to 10 mg daily, hold at that dose.   We will get a CXR in 2 weeks at next visit. Please call sooner as needed.   Script for potassium 10 meq sent

## 2010-10-15 NOTE — Progress Notes (Signed)
Subjective:    Patient ID: [name], female    DOB: 02-11-1941, 70 y.o.   MRN: [id number]  HPI    Review of Systems     Objective:   Physical Exam        Assessment & Plan:   Subjective:    Patient ID: [name], female    DOB: 08-Jul-1940, 70 y.o.   MRN: [id number]  HPI    Review of Systems     Objective:   Physical Exam        Assessment & Plan:   Subjective:    Patient ID: [name], female    DOB: [birth year], 70 y.o.   MRN: [id number]  HPI 08/14/10-  78 yoF never smoker, now in f/u tapering slowly off prednisone and still on diflucan after eosinophilic pneumonia, with original concern for fungal pneumonia. Husband is retired endocrinologist and we discussed best strategy for prednisone.  She is back to baseline minor cough as she lies down, and not short of breath, no longer hearing any wheeze. Strength is coming back. Denies fever, sweats, nodes, rash. All serologies and cultures for routine. AFB and fungal organisms continue negative.   09/15/10- Hx eosinophilic pneumonia. Allergic rhinitis Had a cold 1 week ago, with raspy cough and nasal congestion. Her primary office checked CXR since her prednisone had tapered down to 5 mg. Husband now has the same thing and they feel it is just a cold. Has now been on prednisone at 5 mg daily dose for 1 week.  I reviewed images- clear. She went back on Diovan for BP and is tolerating that. Dr Eloise Harman gave sample script Elwin Sleight to try instead of Asmanex while she was tight with her cold. She asks what to do with that.   10/15/10-Hx eosinophilic pneumonia. Allergic rhinitis   [place]usband [place] got sick with bilateral infiltrates c/w recurrent EP and put back on prednisone.  Ended prednisone 2-3 weeks ago then gradually overwhelming fatigue, needing to lie down. Began dry cough again. , Then fever for 2 days. Went to a Dr in [employer] [place] [employer]. He did labs and CXR. Fungal titers are  pending. WBC went back to 20,000 and O2 sat dropped to 80's, similar to last time.Marland Kitchen He started prednisone 60 x 5 days, now first day of 50 mg /day. He sat improved quckly and she feels back to normal..  We reveiwed her images from the latest CXR.   Review of Systems See HPI Constitutional:   No weight loss, night sweats,  Fevers, chills, fatigue, lassitude. HEENT:   No headaches,  Difficulty swallowing,  Tooth/dental problems,  Sore throat,                No sneezing, itching, ear ache,  CV:  No chest pain,  Orthopnea, PND, swelling in lower extremities, anasarca, dizziness, palpitations  GI  No heartburn, indigestion, abdominal pain, nausea, vomiting, diarrhea, change in bowel habits, loss of appetite  Resp: No shortness of breath with exertion or at rest.  No excess mucus, ,  No coughing up of blood.  No change in color of mucus.  No wheezing.  Skin: no rash or lesions.  GU: no dysuria, change in color of urine, no urgency or frequency.  No flank pain.  MS:  No joint pain or swelling.  No decreased range of motion.  No back pain.  Psych:  No change in mood or affect. No depression or anxiety.  No memory loss.  Objective:   Physical Exam General- Alert, Oriented, Affect-appropriate, Distress- none acute   Mildly cushingoid facies  Skin- rash-none, lesions- none, excoriation- none  Lymphadenopathy- none  Head- atraumatic       Steroid facial pufiness  Eyes- Gross vision intact, PERRLA, conjunctivae clear secretions  Ears- Normal - Hearing, canals,  Nose- Clear,  No-septal dev, mucus, polyps, erosion, perforation   Throat- Mallampati II , mucosa clear , drainage- none, tonsils- atrophic          Neck- flexible , trachea midline, no stridor , thyroid nl, carotid no bruit  Chest - symmetrical excursion , unlabored     Heart/CV- RRR , no murmur , no gallop  , no rub, nl s1 s2                     - JVD- none , edema- none, stasis changes- none, varices- none      Lung- Sounds clear to P&A  wheeze- none, cough- none , dullness-none, rub- none     Chest wall-  Abd- tender-no, distended-no, bowel sounds-present, HSM- no  Br/ Gen/ Rectal- Not done, not indicated  Extrem- cyanosis- none, clubbing, none, atrophy- none, strength- nl  Neuro- grossly intact to observation         Assessment & Plan:

## 2010-10-15 NOTE — Assessment & Plan Note (Signed)
Renal function now normal with creatinine reported around 1. Tends to be hypokalemic, aggravated by steroids. We will maintain at 20 meq/ day for now.

## 2010-10-15 NOTE — Assessment & Plan Note (Signed)
Unusual to see this relapse. I explained that we may still find a different explanation or ongoing trigger.  We will gradually bring prednisone down then hold her at a modest stable dose for long enough to be condfident.

## 2010-10-15 NOTE — Assessment & Plan Note (Signed)
There is no purulent bronchitis and most of the objective findings are related to lung parenchyma.

## 2010-10-15 NOTE — Assessment & Plan Note (Signed)
No significant symptom flare with the current illness.

## 2010-10-23 ENCOUNTER — Telehealth: Payer: Self-pay | Admitting: Internal Medicine

## 2010-10-23 NOTE — Telephone Encounter (Signed)
Spoke with patient-aware I have moved her appt from 10-23-10 to 11-09-10 at 1130am with CXR prior to appt.

## 2010-11-02 ENCOUNTER — Ambulatory Visit: Payer: Medicare Other | Admitting: Internal Medicine

## 2010-11-09 ENCOUNTER — Ambulatory Visit (INDEPENDENT_AMBULATORY_CARE_PROVIDER_SITE_OTHER): Payer: Medicare Other | Admitting: Internal Medicine

## 2010-11-09 ENCOUNTER — Encounter: Payer: Self-pay | Admitting: Internal Medicine

## 2010-11-09 ENCOUNTER — Ambulatory Visit (INDEPENDENT_AMBULATORY_CARE_PROVIDER_SITE_OTHER)
Admission: RE | Admit: 2010-11-09 | Discharge: 2010-11-09 | Disposition: A | Discharge status: Home / Self Care | Payer: Medicare Other | Source: Ambulatory Visit | Attending: Internal Medicine | Admitting: Internal Medicine

## 2010-11-09 ENCOUNTER — Other Ambulatory Visit (INDEPENDENT_AMBULATORY_CARE_PROVIDER_SITE_OTHER): Payer: Medicare Other

## 2010-11-09 VITALS — BP 132/84 | HR 87 | Ht 65.0 in | Wt 131.0 lb

## 2010-11-09 DIAGNOSIS — J8281 Chronic eosinophilic pneumonia: Secondary | ICD-10-CM

## 2010-11-09 DIAGNOSIS — J8289 Other pulmonary eosinophilia, not elsewhere classified: Secondary | ICD-10-CM

## 2010-11-09 LAB — CBC WITH DIFFERENTIAL/PLATELET
Eosinophils Relative: 0.1 % (ref 0.0–5.0)
HCT: 36.3 % (ref 36.0–46.0)
Hemoglobin: 12.3 g/dL (ref 12.0–15.0)
Lymphocytes Relative: 8.1 % — ABNORMAL LOW (ref 12.0–46.0)
Lymphs Abs: 0.8 10*3/uL (ref 0.7–4.0)
Monocytes Relative: 1.5 % — ABNORMAL LOW (ref 3.0–12.0)
Neutro Abs: 9.2 10*3/uL — ABNORMAL HIGH (ref 1.4–7.7)
RBC: 3.98 Mil/uL (ref 3.87–5.11)
WBC: 10.2 10*3/uL (ref 4.5–10.5)

## 2010-11-09 LAB — IGE: IgE (Immunoglobulin E), Serum: 17.8 IU/mL (ref 0.0–180.0)

## 2010-11-09 NOTE — Patient Instructions (Signed)
Go back up to 20 mg prednisone daily x 7 days, starting today. Then take 15 mg daily till we get you back in a month. You can take it all at once, or in divided doses. Please call as needed.   Order- schedule CXR in one month, on return   Dx eosinophilic pneumonia             - lab-   Today- CBC w/ diff, IgE

## 2010-11-09 NOTE — Progress Notes (Signed)
Subjective:    Patient ID: [name], female    DOB: 05-12-1940, 70 y.o.   MRN: [id number]  HPI    Review of Systems     Objective:   Physical Exam        Assessment & Plan:   Subjective:    Patient ID: [name], female    DOB: [birth year], 70 y.o.   MRN: [id number]  HPI    Review of Systems     Objective:   Physical Exam        Assessment & Plan:   Subjective:    Patient ID: [name], female    DOB: September 21, 1940, 70 y.o.   MRN: [id number]  HPI    Review of Systems     Objective:   Physical Exam        Assessment & Plan:   Subjective:    Patient ID: [name], female    DOB: 1941/01/13, 70 y.o.   MRN: [id number]  HPI 08/14/10-  42 yoF never smoker, now in f/u tapering slowly off prednisone and still on diflucan after eosinophilic pneumonia, with original concern for fungal pneumonia. Husband is retired endocrinologist and we discussed best strategy for prednisone.  She is back to baseline minor cough as she lies down, and not short of breath, no longer hearing any wheeze. Strength is coming back. Denies fever, sweats, nodes, rash. All serologies and cultures for routine. AFB and fungal organisms continue negative.   09/15/10- Hx eosinophilic pneumonia. Allergic rhinitis Had a cold 1 week ago, with raspy cough and nasal congestion. Her primary office checked CXR since her prednisone had tapered down to 5 mg. Husband now has the same thing and they feel it is just a cold. Has now been on prednisone at 5 mg daily dose for 1 week.  I reviewed images- clear. She went back on Diovan for BP and is tolerating that. Dr Eloise Harman gave sample script Elwin Sleight to try instead of Asmanex while she was tight with her cold. She asks what to do with that.   10/15/10-Hx eosinophilic pneumonia. Allergic rhinitis   [place] got [place] with bilateral infiltrates c/w recurrent EP and put back on prednisone.    Ended prednisone 2-3 weeks ago then gradually overwhelming fatigue, needing to lie down. Began dry cough again. , Then fever for 2 days. Went to a Dr in [employer]. He did labs and CXR. Fungal titers are pending. WBC went back to 20,000 and O2 sat dropped to 80's, similar to last time.Marland Kitchen He started prednisone 60 x 5 days, now first day of 50 mg /day. He sat improved quckly and she feels back to normal..  We reveiwed her images from the latest CXR.   11/09/10- 45 yo never smoker followed for  Hx eosinophilic pneumonia. Allergic rhinitis Continues maintenance prednisone after relapse of her lung disease addressed last visit late June.  She feels nearly baseline. May be just a little weaker on 3-4 flights of stairs at church, but that's it and she recognizes she is more introspective now so some of that may have begun before she got sick. Now down to prednisone 15 mg daily taken as 10 mg AM, 5 mg PM. No concerning changes in her chest so far. Last time she lasted 2-3 weeks off prednisone, with dry cough and infiltrates. CXR 11/09/10- compared with July 31, 2010- We reviewed and compared images together. Today she has new RUL and RML atelectatic infiltrates, best  seen on lateral view.  She asked for discussion of steroid side effects, which we repeated in detail today. She thinks they maker her "flighty".  Review of Systems See HPI Constitutional:   No weight loss, night sweats,  Fevers, chills, fatigue, lassitude. HEENT:   No headaches,  Difficulty swallowing,  Tooth/dental problems,  Sore throat,                No sneezing, itching, ear ache,  CV:  No chest pain, orthopnea, PND, swelling in lower extremities, anasarca, dizziness, palpitations  GI  No heartburn, indigestion, abdominal pain, nausea, vomiting, diarrhea, change in bowel habits, loss of appetite  Resp:   No excess mucus, ,  No coughing up of blood.  No change in color of mucus.  No wheezing.  Skin: no rash or lesions.  GU: no  dysuria, change in color of urine, no urgency or frequency.  No flank pain.  MS:  No joint pain or swelling.  No decreased range of motion.  No back pain.  Psych:  No change in mood or affect. No depression or anxiety.  No memory loss.      Objective:   Physical Exam General- Alert, Oriented, Affect-appropriate, Distress- none acute     Skin- rash-none, lesions- none, excoriation- none  Lymphadenopathy- none  Head- atraumatic       Steroid facial pufiness  Eyes- Gross vision intact, PERRLA, conjunctivae clear secretions  Ears- Normal - Hearing, canals,  Nose- Clear,  No-septal dev, mucus, polyps, erosion, perforation   Throat- Mallampati II , mucosa clear , drainage- none, tonsils- atrophic          Neck- flexible , trachea midline, no stridor , thyroid nl, carotid no bruit  Chest - symmetrical excursion , unlabored     Heart/CV- RRR , no murmur , no gallop  , no rub, nl s1 s2                     - JVD- none , edema- none, stasis changes- none, varices- none     Lung- Sounds clear to P&A  wheeze- none, cough- none , dullness-none, rub- none     Chest wall-  Abd- tender-no, distended-no, bowel sounds-present, HSM- no  Br/ Gen/ Rectal- Not done, not indicated  Extrem- cyanosis- none, clubbing, none, atrophy- none, strength- nl  Neuro- grossly intact to observation         Assessment & Plan:

## 2010-11-09 NOTE — Assessment & Plan Note (Signed)
She ffels quite well, but early infiltrates are apparent on today's CXR. We are trying to find the lowest maintenance prednisone dose, which may end up being around 10-15 mg daily. For now we will go back to 20 mg x 7 days, then 15 mg for a monthe and reassess. Check CBC and IgE today

## 2010-11-10 NOTE — Progress Notes (Signed)
Quick Note:  Spoke with patients spouse- aware of results. ______

## 2010-11-30 ENCOUNTER — Other Ambulatory Visit: Payer: Self-pay | Admitting: Internal Medicine

## 2010-12-14 ENCOUNTER — Other Ambulatory Visit: Payer: Self-pay | Admitting: Internal Medicine

## 2010-12-14 ENCOUNTER — Ambulatory Visit (INDEPENDENT_AMBULATORY_CARE_PROVIDER_SITE_OTHER)
Admission: RE | Admit: 2010-12-14 | Discharge: 2010-12-14 | Disposition: A | Discharge status: Home / Self Care | Payer: Medicare Other | Source: Ambulatory Visit | Attending: Internal Medicine | Admitting: Internal Medicine

## 2010-12-14 ENCOUNTER — Ambulatory Visit (INDEPENDENT_AMBULATORY_CARE_PROVIDER_SITE_OTHER): Payer: Medicare Other | Admitting: Internal Medicine

## 2010-12-14 ENCOUNTER — Encounter: Payer: Self-pay | Admitting: Internal Medicine

## 2010-12-14 VITALS — BP 140/78 | HR 70 | Ht 65.0 in | Wt 137.4 lb

## 2010-12-14 DIAGNOSIS — J8281 Chronic eosinophilic pneumonia: Secondary | ICD-10-CM

## 2010-12-14 DIAGNOSIS — J8289 Other pulmonary eosinophilia, not elsewhere classified: Secondary | ICD-10-CM

## 2010-12-14 DIAGNOSIS — J309 Allergic rhinitis, unspecified: Secondary | ICD-10-CM

## 2010-12-14 MED ORDER — PREDNISONE 5 MG PO TABS: ORAL_TABLET | ORAL | Status: DC

## 2010-12-14 NOTE — Patient Instructions (Addendum)
Prednisone refill sent  Plan to take 10 mg daily x 1 more week, then 5 mg daily till I see you next.   Order- on return- CXR, and lab for IgE level     Dx eosinophilic pneumonia

## 2010-12-14 NOTE — Assessment & Plan Note (Signed)
Suppressed for now by steroid therapy

## 2010-12-14 NOTE — Assessment & Plan Note (Addendum)
On prednisone 15 mg daily for past month until the past week when she has been down to 10 mg daily after relapse. We will begin gradual taper, trying to find a minimal necessary/ sufficient dose.  Dr Waynard Edwards will also check bone density.  I suggested that she stay on 10 mg daily for one more week, then go to 5 mg daily till return, with CXR and IgE levels then.

## 2010-12-14 NOTE — Progress Notes (Signed)
Subjective:    Patient ID: [name], female    DOB: Jan 05, 1941, 70 y.o.   MRN: [id number]  HPI    Review of Systems     Objective:   Physical Exam        Assessment & Plan:   Subjective:    Patient ID: [name], female    DOB: 07/14/1940, 70 y.o.   MRN: [id number]  HPI    Review of Systems     Objective:   Physical Exam        Assessment & Plan:   Subjective:    Patient ID: [name], female    DOB: 1940-07-11, 70 y.o.   MRN: [id number]  HPI    Review of Systems     Objective:   Physical Exam        Assessment & Plan:   Subjective:    Patient ID: [name], female    DOB: Aug 27, 1940, 70 y.o.   MRN: [id number]  HPI    Review of Systems     Objective:   Physical Exam        Assessment & Plan:   Subjective:    Patient ID: [name], female    DOB: 04-Jun-1940, 70 y.o.   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 Husband is retired endocrinologist and we discussed best strategy for prednisone.  She is back to baseline minor cough as she lies down, and not short of breath, no longer hearing any wheeze. Strength is coming back. Denies fever, sweats, nodes, rash. All serologies and cultures for routine. AFB and fungal organisms continue negative.   09/15/10- Hx eosinophilic pneumonia. Allergic rhinitis Had a cold 1 week ago, with raspy cough and nasal congestion. Her primary office checked CXR since her prednisone had tapered down to 5 mg. Husband now has the same thing and they feel it is just a cold. Has now been on prednisone at 5 mg daily dose for 1 week.  I reviewed images- clear. She went back on Diovan for BP and is tolerating that. Dr Eloise Harman gave sample script Elwin Sleight to try instead of Asmanex while she was tight with her cold.  She asks what to do with that.   10/15/10-Hx eosinophilic pneumonia. Allergic rhinitis   [place]band [place] got sick with bilateral infiltrates c/w recurrent EP and put back on prednisone.  Ended prednisone 2-3 weeks ago then gradually overwhelming fatigue, needing to lie down. Began dry cough again. , Then fever for 2 days. Went to a Dr in [employer]. He did labs and CXR. Fungal titers are pending. WBC went back to 20,000 and O2 sat dropped to 80's, similar to last time.Marland Kitchen He started prednisone 60 x 5 days, now first day of 50 mg /day. He sat improved quckly and she feels back to normal..  We reveiwed her images from the latest CXR.   11/09/10- 39 yo never smoker followed for  Hx eosinophilic pneumonia. Allergic rhinitis Continues maintenance prednisone after relapse of her lung disease addressed last visit late June.  She feels nearly baseline. May be just a little weaker on 3-4 flights of stairs at church, but that's it and she recognizes she is more introspective now so some of that may have begun before she got sick. Now down to prednisone 15 mg daily taken as 10  mg AM, 5 mg PM. No concerning changes in her chest so far. Last time she lasted 2-3 weeks off prednisone, with dry cough and infiltrates. CXR 11/09/10- compared with July 31, 2010- We reviewed and compared images together. Today she has new RUL and RML atelectatic infiltrates, best seen on lateral view.  She asked for discussion of steroid side effects, which we repeated in detail today. She thinks they maker her "flighty".  12/14/10- 63 yo never smoker followed for  Hx eosinophilic pneumonia, complicated by hepatitis and renal failure in remission, Allergic rhinitis Maintaining prednisone 15 mg daily and Asmanex. She feels fine. CXR- images reviewed. Stable now with residual density near minor fissure and under hilum, best appreciated on lateral. I pointed out calcification in her aorta and she will discuss statin therapy  when she sees Dr Waynard Edwards for annual physical tomorrow.l   Review of Systems See HPI Constitutional:   No weight loss, night sweats,  Fevers, chills, fatigue, lassitude. HEENT:   No headaches,  Difficulty swallowing,  Tooth/dental problems,  Sore throat,                No sneezing, itching, ear ache, CV:  No chest pain, orthopnea, PND, swelling in lower extremities, anasarca, dizziness, palpitations GI  No heartburn, indigestion, abdominal pain, nausea, vomiting, diarrhea, change in bowel habits, loss of appetite Resp:   No excess mucus, ,  No coughing up of blood.  No change in color of mucus.  No wheezing. Skin: no rash or lesions. GU: no dysuria, change in color of urine, no urgency or frequency.  No flank pain. MS:  No joint pain or swelling.  No decreased range of motion.  No back pain. Psych:  No change in mood or affect. No depression or anxiety.  No memory loss.      Objective:   Physical Exam General- Alert, Oriented, Affect-appropriate, Distress- none acute Skin- rash-none, lesions- none, excoriation- none Lymphadenopathy- none Head- atraumatic            Eyes- Gross vision intact, PERRLA, conjunctivae clear secretions            Ears- Hearing, canals normal            Nose- Clear, minimal-Septal dev,    no-mucus, polyps, erosion, perforation             Throat- Mallampati II , mucosa clear , drainage- none, tonsils- atrophic Neck- flexible , trachea midline, no stridor , thyroid nl, carotid no bruit Chest - symmetrical excursion , unlabored           Heart/CV- RRR , no murmur , no gallop  , no rub, nl s1 s2                           - JVD- none , edema- none, stasis changes- none, varices- none           Lung- clear to P&A, wheeze- none, cough- none , dullness-none, rub- none           Chest wall-  Abd- tender-no, distended-no, bowel sounds-present, HSM- no Br/ Gen/ Rectal- Not done, not indicated Extrem- cyanosis- none, clubbing, none, atrophy- none, strength- nl Neuro-  grossly intact to observation          Assessment & Plan:

## 2010-12-31 ENCOUNTER — Ambulatory Visit: Payer: Medicare Other | Admitting: Internal Medicine

## 2010-12-31 NOTE — Progress Notes (Signed)
Subjective:    Patient ID: [name], female    DOB: 04/30/1940, 70 y.o.   MRN: [id number]  HPI    Review of Systems     Objective:   Physical Exam        Assessment & Plan:   Subjective:    Patient ID: [name], female    DOB: 08-17-1940, 70 y.o.   MRN: [id number]  HPI    Review of Systems     Objective:   Physical Exam        Assessment & Plan:   Subjective:    Patient ID: [name], female    DOB: Sep 01, 1940, 70 y.o.   MRN: [id number]  HPI    Review of Systems     Objective:   Physical Exam        Assessment & Plan:   Subjective:    Patient ID: [name], female    DOB: 02-06-1941, 70 y.o.   MRN: [id number]  HPI    Review of Systems     Objective:   Physical Exam        Assessment & Plan:   Subjective:    Patient ID: [name], female    DOB: 08-24-1940, 70 y.o.   MRN: [id number]  HPI    Review of Systems     Objective:   Physical Exam        Assessment & Plan:   Subjective:    Patient ID: [name], female    DOB: 01-Feb-1941, 70 y.o.   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 Husband is retired endocrinologist and we discussed best strategy for prednisone.  She is back to baseline minor cough as she lies down, and not short of breath, no longer hearing any wheeze. Strength is coming back. Denies fever, sweats, nodes, rash. All serologies and cultures for routine. AFB and fungal organisms continue negative.   09/15/10- Hx eosinophilic pneumonia. Allergic rhinitis Had a cold 1 week ago, with raspy cough and nasal congestion. Her primary office checked CXR since her prednisone had tapered down to 5 mg. Husband now has the same thing and they feel it is just a cold. Has now been on prednisone at 5 mg  daily dose for 1 week.  I reviewed images- clear. She went back on Diovan for BP and is tolerating that. Dr Eloise Harman gave sample script Elwin Sleight to try instead of Asmanex while she was tight with her cold. She asks what to do with that.   10/15/10-Hx eosinophilic pneumonia. Allergic rhinitis   [place] got sick with bilateral infiltrates c/w recurrent EP and put back on prednisone.  Ended prednisone 2-3 weeks ago then gradually overwhelming fatigue, needing to lie down. Began dry cough again. , Then fever for 2 days. Went to a Dr in [employer]. He did labs and CXR. Fungal titers are pending. WBC went back to 20,000 and O2 sat dropped to 80's, similar to last time.Marland Kitchen He started prednisone 60 x 5 days, now first day of 50 mg /day. He sat improved quckly and she feels back to normal..  We reveiwed her images from the latest CXR.   11/09/10- 72 yo never smoker followed for  Hx eosinophilic pneumonia. Allergic rhinitis Continues maintenance prednisone after relapse of her lung disease addressed last visit late June.  She feels nearly baseline. May be just a little weaker on 3-4 flights of stairs at church, but that's it and she recognizes she is more introspective now so some of that may have begun before she got sick. Now down to prednisone 15 mg daily taken as 10 mg AM, 5 mg PM. No concerning changes in her chest so far. Last time she lasted 2-3 weeks off prednisone, with dry cough and infiltrates. CXR 11/09/10- compared with July 31, 2010- We reviewed and compared images together. Today she has new RUL and RML atelectatic infiltrates, best seen on lateral view.  She asked for discussion of steroid side effects, which we repeated in detail today. She thinks they maker her "flighty".  12/14/10- 82 yo never smoker followed for  Hx eosinophilic pneumonia, complicated by hepatitis and renal failure in remission, Allergic rhinitis Maintaining prednisone 15 mg daily and Asmanex. She feels  fine. CXR- images reviewed. Stable now with residual density near minor fissure and under hilum, best appreciated on lateral. I pointed out calcification in her aorta and she will discuss statin therapy when she sees Dr Waynard Edwards for annual physical tomorrow.l   12/31/10-  4 yo never smoker followed for  Hx eosinophilic pneumonia, complicated by hepatitis and renal failure in remission, Allergic rhinitis.   Review of Systems See HPI Constitutional:   No weight loss, night sweats,  Fevers, chills, fatigue, lassitude. HEENT:   No headaches,  Difficulty swallowing,  Tooth/dental problems,  Sore throat,                No sneezing, itching, ear ache, CV:  No chest pain, orthopnea, PND, swelling in lower extremities, anasarca, dizziness, palpitations GI  No heartburn, indigestion, abdominal pain, nausea, vomiting, diarrhea, change in bowel habits, loss of appetite Resp:   No excess mucus, ,  No coughing up of blood.  No change in color of mucus.  No wheezing. Skin: no rash or lesions. GU: no dysuria, change in color of urine, no urgency or frequency.  No flank pain. MS:  No joint pain or swelling.  No decreased range of motion.  No back pain. Psych:  No change in mood or affect. No depression or anxiety.  No memory loss.      Objective:   Physical Exam General- Alert, Oriented, Affect-appropriate, Distress- none acute Skin- rash-none, lesions- none, excoriation- none Lymphadenopathy- none Head- atraumatic            Eyes- Gross vision intact, PERRLA, conjunctivae clear secretions            Ears- Hearing, canals normal            Nose- Clear, minimal-Septal dev,    no-mucus, polyps, erosion, perforation             Throat- Mallampati II , mucosa clear , drainage- none, tonsils- atrophic Neck- flexible , trachea midline, no stridor , thyroid nl, carotid no bruit Chest - symmetrical excursion , unlabored           Heart/CV- RRR , no murmur , no gallop  , no rub, nl s1 s2                            - JVD- none , edema- none, stasis changes- none, varices- none           Lung- clear to P&A, wheeze- none, cough- none , dullness-none, rub- none  Chest wall-  Abd- tender-no, distended-no, bowel sounds-present, HSM- no Br/ Gen/ Rectal- Not done, not indicated Extrem- cyanosis- none, clubbing, none, atrophy- none, strength- nl Neuro- grossly intact to observation          Assessment & Plan:

## 2011-01-15 ENCOUNTER — Ambulatory Visit (INDEPENDENT_AMBULATORY_CARE_PROVIDER_SITE_OTHER)
Admission: RE | Admit: 2011-01-15 | Discharge: 2011-01-15 | Disposition: A | Discharge status: Home / Self Care | Payer: Medicare Other | Source: Ambulatory Visit | Attending: Internal Medicine | Admitting: Internal Medicine

## 2011-01-15 ENCOUNTER — Other Ambulatory Visit: Payer: Medicare Other

## 2011-01-15 DIAGNOSIS — J8289 Other pulmonary eosinophilia, not elsewhere classified: Secondary | ICD-10-CM

## 2011-01-15 DIAGNOSIS — J8281 Chronic eosinophilic pneumonia: Secondary | ICD-10-CM

## 2011-01-15 LAB — IGE: IgE (Immunoglobulin E), Serum: 20.8 IU/mL (ref 0.0–180.0)

## 2011-01-18 ENCOUNTER — Encounter: Payer: Self-pay | Admitting: Internal Medicine

## 2011-01-18 ENCOUNTER — Ambulatory Visit (INDEPENDENT_AMBULATORY_CARE_PROVIDER_SITE_OTHER): Payer: Medicare Other | Admitting: Internal Medicine

## 2011-01-18 VITALS — BP 118/72 | HR 85 | Ht 64.5 in | Wt 135.6 lb

## 2011-01-18 DIAGNOSIS — J8281 Chronic eosinophilic pneumonia: Secondary | ICD-10-CM

## 2011-01-18 DIAGNOSIS — J8289 Other pulmonary eosinophilia, not elsewhere classified: Secondary | ICD-10-CM

## 2011-01-18 DIAGNOSIS — J328 Other chronic sinusitis: Secondary | ICD-10-CM

## 2011-01-18 DIAGNOSIS — Z23 Encounter for immunization: Secondary | ICD-10-CM

## 2011-01-18 NOTE — Patient Instructions (Signed)
Prednisone- increase now to 20 mg daily (=4 of the 5 mg tabs) If doing ok after 7 days, try reducing to 15 mg daily and hold there.  Flu vax

## 2011-01-18 NOTE — Progress Notes (Signed)
Patient ID: [name], female    DOB: 21-Mar-1941, 70 y.o.   MRN: [id number] HPI Review of Systems   Objective:   Physical Exam    Patient ID: [name], female    DOB: Jan 10, 1941, 70 y.o.   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 Husband is retired endocrinologist and we discussed best strategy for prednisone.  She is back to baseline minor cough as she lies down, and not short of breath, no longer hearing any wheeze. Strength is coming back. Denies fever, sweats, nodes, rash. All serologies and cultures for routine. AFB and fungal organisms continue negative.   09/15/10- Hx eosinophilic pneumonia. Allergic rhinitis Had a cold 1 week ago, with raspy cough and nasal congestion. Her primary office checked CXR since her prednisone had tapered down to 5 mg. Husband now has the same thing and they feel it is just a cold. Has now been on prednisone at 5 mg daily dose for 1 week.  I reviewed images- clear. She went back on Diovan for BP and is tolerating that. Dr Eloise Harman gave sample script Elwin Sleight to try instead of Asmanex while she was tight with her cold. She asks what to do with that.   10/15/10-Hx eosinophilic pneumonia. Allergic rhinitis   [place] got [place] with bilateral infiltrates c/w recurrent EP and put back on prednisone.  Ended prednisone 2-3 weeks ago then gradually overwhelming fatigue, needing to lie down. Began dry cough again. , Then fever for 2 days. Went to a Dr in [employer]. He did labs and CXR. Fungal titers are pending. WBC went back to 20,000 and O2 sat dropped to 80's, similar to last time.Marland Kitchen He started prednisone 60 x 5 days, now first day of 50 mg /day. He sat improved quckly and she feels back to normal..  We reveiwed her images from the latest CXR.   11/09/10- 48 yo never smoker  followed for  Hx eosinophilic pneumonia. Allergic rhinitis Continues maintenance prednisone after relapse of her lung disease addressed last visit late June.  She feels nearly baseline. May be just a little weaker on 3-4 flights of stairs at church, but that's it and she recognizes she is more introspective now so some of that may have begun before she got sick. Now down to prednisone 15 mg daily taken as 10 mg AM, 5 mg PM. No concerning changes in her chest so far. Last time she lasted 2-3 weeks off prednisone, with dry cough and infiltrates. CXR 11/09/10- compared with July 31, 2010- We reviewed and compared images together. Today she has new RUL and RML atelectatic infiltrates, best seen on lateral view.  She asked for discussion of steroid side effects, which we repeated in detail today. She thinks they maker her "flighty".  12/14/10- 21 yo never smoker followed for  Hx eosinophilic pneumonia, complicated by hepatitis and renal failure in remission, Allergic rhinitis Maintaining prednisone 15 mg daily and Asmanex. She feels fine. CXR- images reviewed. Stable now with residual density near minor fissure and under hilum, best appreciated on lateral. I pointed out calcification in her aorta and she will discuss statin therapy when she sees Dr Waynard Edwards for annual physical tomorrow.l   12/31/10-  21 yo never smoker followed for  Hx eosinophilic pneumonia, complicated by hepatitis and renal failure in remission, Allergic rhinitis.  01/18/11-  74 yo never smoker followed for  Hx eosinophilic pneumonia, complicated by hepatitis and renal failure in remission, Allergic rhinitis. Wife of Dr [name] She had returned from a recent trip to [place] during which she and several of those traveling with her caught colds. She alternated prednisone 5 mg with 10 mg every other day while on the trip. For the past week she dropped back to 5 mg daily. She admits a little low-grade fever in the last few days. Cough only  scant clear or trace yellow sputum. She denies rash, swollen glands, chest pain joint pain or swelling. IgE level-07/05/2010 was 873.9, July 23 was 17.8, September 28 was 20.8. CXR 01/15/2011 *RADIOLOGY REPORT*  Clinical Data: History of eosinophilic pneumonia.  CHEST - 2 VIEW  Comparison: Chest 12/14/2010.  Findings: There is new extensive airspace opacity the periphery of  the right upper lobe. Small nodular opacity in the left lower lobe  is unchanged. Heart size is normal. No pneumothorax or effusion.  IMPRESSION:  New extensive peripheral airspace opacity in the right upper lobe  is consistent with pneumonia. 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  Original Report Authenticated By: Bernadene Bell. [name], M.D.    Review of Systems- See HPI Constitutional:   No weight loss, night sweats,  + fever,    No-chills, fatigue, lassitude. HEENT:   No headaches,  Difficulty swallowing,  Tooth/dental problems, + recent sore throat,                No sneezing, itching, ear ache, CV:  No chest pain, orthopnea, PND, swelling in lower extremities, anasarca, dizziness, palpitations GI  No heartburn, indigestion, abdominal pain, nausea, vomiting, diarrhea, change in bowel habits, loss of appetite Resp:   No excess mucus, ,  No coughing up of blood.  No change in color of mucus.  No wheezing. Skin: no rash or lesions. GU: no dysuria, change in color of urine, no urgency or frequency.  No flank pain. MS:  No joint pain or swelling.  No decreased range of motion.  No back pain. Psych:  No change in mood or affect. No depression or anxiety.  No memory loss.      Objective:   Physical Exam General- Alert, Oriented, Affect-appropriate, Distress- none acute Skin- rash-none, lesions- none, excoriation- none Lymphadenopathy- none Head- atraumatic            Eyes- Gross vision intact, PERRLA, conjunctivae clear secretions. 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            Ears-  Hearing, canals normal            Nose- Clear, minimal-Septal dev,    no-mucus, polyps, erosion, perforation             Throat- Mallampati II , mucosa clear , drainage- none, tonsils- atrophic Neck- flexible , trachea midline, no stridor , thyroid nl, carotid no bruit Chest - symmetrical excursion , unlabored           Heart/CV- RRR , no murmur , no gallop  , no rub, nl s1 s2                           - JVD- none , edema- none, stasis changes- none, varices- none           Lung- clear to P&A, wheeze- none, cough- none , dullness-none, rub- none. Even knowing the chest x-ray picture, I do not hear abnormal sounds in  the right lung.           Chest wall-  Abd- tender-no, distended-no, bowel sounds-present, HSM- no Br/ Gen/ Rectal- Not done, not indicated Extrem- cyanosis- none, clubbing, none, atrophy- none, strength- nl Neuro- grossly intact to observation          Assessment & Plan:

## 2011-01-19 NOTE — Assessment & Plan Note (Addendum)
She has her chronic periorbital edema and may be a little tireder than she thinks she is, but she is not describing active sinus symptoms today.

## 2011-01-21 NOTE — Assessment & Plan Note (Signed)
We have little choice except to increase her prednisone for now. She has remained very sensitive to steroid therapy. Because of the recurrent exacerbations of this disease, today I suggested [facility] management. Early in her course we had come close to transfer to [facility] for infectious disease help when we thought this might be a fungus infection. I will discuss options with my group and make suggestions at her next visit. Meanwhile we are increasing prednisone.

## 2011-01-27 ENCOUNTER — Telehealth: Payer: Self-pay | Admitting: Internal Medicine

## 2011-01-27 NOTE — Telephone Encounter (Signed)
After discussion with Dr Cheryll Cockayne, I have placed a call to Dr Carney Bern seeking to refer [name] to him for evaluation of her eosinophilic pneumonia. He is out of town and given my cell to call back.

## 2011-02-02 ENCOUNTER — Telehealth: Payer: Self-pay | Admitting: Internal Medicine

## 2011-02-02 NOTE — Telephone Encounter (Signed)
Per CY-we have called Dr Sandy Salaam in [facility] told he was out of town and we have not gotten a return call as of yet. CY will try again to reach Dr Sandy Salaam. Pt should keep appointment tomorrow with CY. I have spoken with Dr. Cheryll Cockayne and he is aware of the above.

## 2011-02-03 ENCOUNTER — Ambulatory Visit (INDEPENDENT_AMBULATORY_CARE_PROVIDER_SITE_OTHER)
Admission: RE | Admit: 2011-02-03 | Discharge: 2011-02-03 | Disposition: A | Discharge status: Home / Self Care | Payer: Medicare Other | Source: Ambulatory Visit | Attending: Internal Medicine | Admitting: Internal Medicine

## 2011-02-03 ENCOUNTER — Ambulatory Visit (INDEPENDENT_AMBULATORY_CARE_PROVIDER_SITE_OTHER): Payer: Medicare Other | Admitting: Internal Medicine

## 2011-02-03 ENCOUNTER — Encounter: Payer: Self-pay | Admitting: Internal Medicine

## 2011-02-03 ENCOUNTER — Ambulatory Visit (INDEPENDENT_AMBULATORY_CARE_PROVIDER_SITE_OTHER): Payer: Medicare Other

## 2011-02-03 VITALS — BP 122/82 | HR 81 | Ht 64.5 in | Wt 135.8 lb

## 2011-02-03 DIAGNOSIS — J8281 Chronic eosinophilic pneumonia: Secondary | ICD-10-CM

## 2011-02-03 DIAGNOSIS — J309 Allergic rhinitis, unspecified: Secondary | ICD-10-CM

## 2011-02-03 DIAGNOSIS — J8289 Other pulmonary eosinophilia, not elsewhere classified: Secondary | ICD-10-CM

## 2011-02-03 LAB — BASIC METABOLIC PANEL
BUN: 21 mg/dL (ref 6–23)
CO2: 25 mEq/L (ref 19–32)
Chloride: 102 mEq/L (ref 96–112)
Creatinine, Ser: 0.8 mg/dL (ref 0.4–1.2)
Potassium: 3.7 mEq/L (ref 3.5–5.1)

## 2011-02-03 LAB — CBC WITH DIFFERENTIAL/PLATELET
Basophils Relative: 1 % (ref 0.0–3.0)
Eosinophils Absolute: 0.3 10*3/uL (ref 0.0–0.7)
Eosinophils Relative: 1.9 % (ref 0.0–5.0)
HCT: 35.6 % — ABNORMAL LOW (ref 36.0–46.0)
Lymphs Abs: 1.5 10*3/uL (ref 0.7–4.0)
MCHC: 33.1 g/dL (ref 30.0–36.0)
MCV: 90.3 fl (ref 78.0–100.0)
Monocytes Absolute: 0.5 10*3/uL (ref 0.1–1.0)
Neutrophils Relative %: 85.6 % — ABNORMAL HIGH (ref 43.0–77.0)
RBC: 3.95 Mil/uL (ref 3.87–5.11)
WBC: 17.4 10*3/uL — ABNORMAL HIGH (ref 4.5–10.5)

## 2011-02-03 NOTE — Progress Notes (Signed)
Patient ID: [name], female    DOB: March 20, 1941, 70 y.o.   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 Husband is retired endocrinologist and we discussed best strategy for prednisone.  She is back to baseline minor cough as she lies down, and not short of breath, no longer hearing any wheeze. Strength is coming back. Denies fever, sweats, nodes, rash. All serologies and cultures for routine. AFB and fungal organisms continue negative.   09/15/10- Hx eosinophilic pneumonia. Allergic rhinitis Had a cold 1 week ago, with raspy cough and nasal congestion. Her primary office checked CXR since her prednisone had tapered down to 5 mg. Husband now has the same thing and they feel it is just a cold. Has now been on prednisone at 5 mg daily dose for 1 week.  I reviewed images- clear. She went back on Diovan for BP and is tolerating that. Dr Eloise Harman gave sample script Elwin Sleight to try instead of Asmanex while she was tight with her cold. She asks what to do with that.   10/15/10-Hx eosinophilic pneumonia. Allergic rhinitis   [place] got [place] with bilateral infiltrates c/w recurrent EP and put back on prednisone.  Ended prednisone 2-3 weeks ago then gradually overwhelming fatigue, needing to lie down. Began dry cough again. , Then fever for 2 days. Went to a Dr in [employer] [place] [employer]. He did labs and CXR. Fungal titers are pending. WBC went back to 20,000 and O2 sat dropped to 80's, similar to last time.Marland Kitchen He started prednisone 60 x 5 days, now first day of 50 mg /day. Her sat improved quckly and she feels back to normal..  We reveiwed her images from the latest CXR.   11/09/10- 57 yo never smoker followed for  Hx eosinophilic pneumonia. Allergic rhinitis Continues maintenance prednisone after relapse of her lung disease addressed last  visit late June.  She feels nearly baseline. May be just a little weaker on 3-4 flights of stairs at church, but that's it and she recognizes she is more introspective now so some of that may have begun before she got sick. Now down to prednisone 15 mg daily taken as 10 mg AM, 5 mg PM. No concerning changes in her chest so far. Last time she lasted 2-3 weeks off prednisone, with dry cough and infiltrates. CXR 11/09/10- compared with July 31, 2010- We reviewed and compared images together. Today she has new RUL and RML atelectatic infiltrates, best seen on lateral view.  She asked for discussion of steroid side effects, which we repeated in detail today. She thinks they maker her "flighty".  12/14/10- 89 yo never smoker followed for  Hx eosinophilic pneumonia, complicated by hepatitis and renal failure in remission, Allergic rhinitis Maintaining prednisone 15 mg daily and Asmanex. She feels fine. CXR- images reviewed. Stable now with residual density near minor fissure and under hilum, best appreciated on lateral. I pointed out calcification in her aorta and she will discuss statin therapy when she sees Dr Waynard Edwards for annual physical tomorrow.l   12/31/10-  67 yo never smoker followed for  Hx eosinophilic pneumonia, complicated by hepatitis and renal failure in remission, Allergic rhinitis.  01/18/11-  77 yo never smoker followed for  Hx eosinophilic pneumonia, complicated by hepatitis and renal failure in remission, Allergic rhinitis. Wife of Dr [name] She had returned from  a recent trip to [place] during which she and several of those traveling with her caught colds. She alternated prednisone 5 mg with 10 mg every other day while on the trip. For the past week she dropped back to 5 mg daily. She admits a little low-grade fever in the last few days. Cough only scant clear or trace yellow sputum. She denies rash, swollen glands, chest pain joint pain or swelling. IgE level-07/05/2010 was 873.9, July 23  was 17.8, September 28 was 20.8. CXR 01/15/2011 *RADIOLOGY REPORT*  Clinical Data: History of eosinophilic pneumonia.  CHEST - 2 VIEW  Comparison: Chest 12/14/2010.  Findings: There is new extensive airspace opacity the periphery of  the right upper lobe. Small nodular opacity in the left lower lobe  is unchanged. Heart size is normal. No pneumothorax or effusion.  IMPRESSION:  New extensive peripheral airspace opacity in the right upper lobe  is consistent with pneumonia. 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  Original Report Authenticated By: Bernadene Bell. D'ALESSIO, M.D.   02/03/11- 8 yo never smoker followed for  Hx eosinophilic pneumonia, complicated by hepatitis and renal failure in remission, Allergic rhinitis. Wife of Dr [name] She has been alternating prednisone 10 mg with 5 mg every other day for the past 7-10 days. Has had flu vaccine. She feels stable now and has no chest symptoms, except "tiny little cough". She does occasionally stop while on long walks 2 to exertional dyspnea. Other than walking a couple of miles with her husband, she is not exercising. We again reviewed her chest x-ray images from September 28 showing a new right lung infiltrate. She thinks her hair was thinning while on higher dose prednisone, and is coming back now. She is not using Asmanex. CXR 02/03/2011-persistent infiltrate in the right lung. I think it shows partial clearing in that area. There is some less impressive bilateral scarring. She is continuing prednisone alternating 10 with 5 mg every other day  Review of Systems- See HPI Constitutional:   No weight loss, night sweats, fever,    No-chills, fatigue, lassitude. HEENT:   No headaches,  Difficulty swallowing,  Tooth/dental problems, recent sore throat,                No sneezing, itching, ear ache, CV:  No chest pain, orthopnea, PND, swelling in lower extremities, anasarca, dizziness, palpitations GI  No heartburn, indigestion,  abdominal pain, nausea, vomiting, diarrhea, change in bowel habits, loss of appetite Resp:   No excess mucus, ,  No coughing up of blood.  No change in color of mucus.  No wheezing. Skin: no rash or lesions. GU: no dysuria, change in color of urine, no urgency or frequency.  No flank pain. MS:  No joint pain or swelling.  No decreased range of motion.  No back pain. Psych:  No change in mood or affect. No depression or anxiety.  No memory loss.      Objective:   Physical Exam General- Alert, Oriented, Affect-appropriate, Distress- none acute Skin- rash-none, lesions- none, excoriation- none Lymphadenopathy- none Head- atraumatic            Eyes- Gross vision intact, PERRLA, conjunctivae clear secretions. 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            Ears- Hearing, canals normal            Nose- Clear, minimal-Septal dev,    no-mucus, polyps, erosion, perforation  Throat- Mallampati II , mucosa clear , drainage- none, tonsils- atrophic. Sniffing. Neck- flexible , trachea midline, no stridor , thyroid nl, carotid no bruit Chest - symmetrical excursion , unlabored           Heart/CV- RRR , no murmur , no gallop  , no rub, nl s1 s2                           - JVD- none , edema- none, stasis changes- none, varices- none           Lung- some crackles are heard in the upper right,   wheeze- none, cough- none , dullness-none, rub- none.           Chest wall-  Abd- tender-no, distended-no, bowel sounds-present, HSM- no Br/ Gen/ Rectal- Not done, not indicated Extrem- cyanosis- none, clubbing, none, atrophy- none, strength- nl Neuro- grossly intact to observation

## 2011-02-03 NOTE — Patient Instructions (Addendum)
Continue prednisone at 10/5 every other day.  Order- CXR- eosinophilic pneumonia             BMET, CBC w/diff,  IgE level  We have contacted Dr Ronnald Nian at [facility] and he is to have April Gerringer from his office in contact with [place]. I will call her if she doesn't reach me today.

## 2011-02-04 ENCOUNTER — Telehealth: Payer: Self-pay | Admitting: Internal Medicine

## 2011-02-04 NOTE — Telephone Encounter (Signed)
I called Dr Oleta Mouse office at [facility] and spoke with clinic scheduler April Gerringer. She is working to find a time when she can add to his schedule and then will contact Mrs Canton directly. I left that info on the [employer] answering machine.

## 2011-02-05 NOTE — Assessment & Plan Note (Addendum)
Compared to her initial presentation, she feels very much better. With some improvement in chest x-ray to my eye, and with symptomatic improvement, I have suggested that we continue prednisone alternating 10 mg with 5 mg every other day. I have been able to contact Dr Carney Bern in the pulmonary division at [facility] and we are arranging referral for his opinion.

## 2011-02-05 NOTE — Assessment & Plan Note (Signed)
History of perennial and seasonal rhinitis mild exacerbation with stiffing recently can be treated symptomatically

## 2011-02-05 NOTE — Progress Notes (Signed)
Quick Note:  LMTCB ______ 

## 2011-02-08 ENCOUNTER — Telehealth: Payer: Self-pay | Admitting: Internal Medicine

## 2011-02-08 NOTE — Progress Notes (Signed)
Quick Note:  Pt is aware of results in great detail; if any other questions or concerns the patient or her husband Dr. Cheryll Cockayne will call our office. ______

## 2011-02-09 NOTE — Telephone Encounter (Signed)
No need for message. °

## 2011-03-01 ENCOUNTER — Telehealth: Payer: Self-pay | Admitting: Internal Medicine

## 2011-03-01 NOTE — Telephone Encounter (Signed)
Error.[name] ° °

## 2011-03-22 ENCOUNTER — Ambulatory Visit: Payer: Medicare Other | Admitting: Internal Medicine

## 2011-03-23 ENCOUNTER — Telehealth: Payer: Self-pay | Admitting: Internal Medicine

## 2011-03-23 NOTE — Telephone Encounter (Signed)
PT seen Dr Sandy Salaam 2 weeks ago in [facility] and wants to know if Dr Maple Hudson has received correspondence from him yet.

## 2011-03-23 NOTE — Telephone Encounter (Signed)
I spoke with patient-she is aware we are still waiting on information from [facility]; she will speak with her husband and let me know if they would like me to call again and request we get the information.

## 2011-03-23 NOTE — Telephone Encounter (Signed)
No. I have been waiting to hear something.

## 2011-04-26 DIAGNOSIS — E559 Vitamin D deficiency, unspecified: Secondary | ICD-10-CM | POA: Diagnosis not present

## 2011-04-26 DIAGNOSIS — R059 Cough, unspecified: Secondary | ICD-10-CM | POA: Diagnosis not present

## 2011-04-26 DIAGNOSIS — I10 Essential (primary) hypertension: Secondary | ICD-10-CM | POA: Diagnosis not present

## 2011-04-26 DIAGNOSIS — R05 Cough: Secondary | ICD-10-CM | POA: Diagnosis not present

## 2011-04-26 DIAGNOSIS — E785 Hyperlipidemia, unspecified: Secondary | ICD-10-CM | POA: Diagnosis not present

## 2011-05-18 ENCOUNTER — Ambulatory Visit: Payer: Self-pay | Admitting: Internal Medicine

## 2011-05-26 ENCOUNTER — Other Ambulatory Visit: Payer: Self-pay | Admitting: Internal Medicine

## 2011-05-26 DIAGNOSIS — Z1231 Encounter for screening mammogram for malignant neoplasm of breast: Secondary | ICD-10-CM

## 2011-05-26 DIAGNOSIS — H401290 Low-tension glaucoma, unspecified eye, stage unspecified: Secondary | ICD-10-CM | POA: Diagnosis not present

## 2011-06-14 ENCOUNTER — Encounter: Payer: Self-pay | Admitting: Internal Medicine

## 2011-06-14 ENCOUNTER — Ambulatory Visit (INDEPENDENT_AMBULATORY_CARE_PROVIDER_SITE_OTHER)
Admission: RE | Admit: 2011-06-14 | Discharge: 2011-06-14 | Disposition: A | Discharge status: Home / Self Care | Payer: Medicare Other | Source: Ambulatory Visit | Attending: Internal Medicine | Admitting: Internal Medicine

## 2011-06-14 ENCOUNTER — Ambulatory Visit (INDEPENDENT_AMBULATORY_CARE_PROVIDER_SITE_OTHER): Payer: Medicare Other | Admitting: Internal Medicine

## 2011-06-14 VITALS — BP 122/72 | HR 73 | Ht 64.5 in | Wt 142.2 lb

## 2011-06-14 DIAGNOSIS — J8289 Other pulmonary eosinophilia, not elsewhere classified: Secondary | ICD-10-CM

## 2011-06-14 DIAGNOSIS — J984 Other disorders of lung: Secondary | ICD-10-CM | POA: Diagnosis not present

## 2011-06-14 DIAGNOSIS — J8281 Chronic eosinophilic pneumonia: Secondary | ICD-10-CM

## 2011-06-14 NOTE — Patient Instructions (Signed)
Order- CXR- dx eosinophilic pneumonia  ------------------------------  Suggest you stay on prednisone 7.5 mg daily x 2 weeks. If stable, then drop down to 5 mg daily. Plan on staying on that for a couple of months, till out next visit.

## 2011-06-14 NOTE — Progress Notes (Signed)
Patient ID: [name], female    DOB: March 20, 1941, 71 y.o.   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 Husband is retired endocrinologist and we discussed best strategy for prednisone.  She is back to baseline minor cough as she lies down, and not short of breath, no longer hearing any wheeze. Strength is coming back. Denies fever, sweats, nodes, rash. All serologies and cultures for routine. AFB and fungal organisms continue negative.   09/15/10- Hx eosinophilic pneumonia. Allergic rhinitis Had a cold 1 week ago, with raspy cough and nasal congestion. Her primary office checked CXR since her prednisone had tapered down to 5 mg. Husband now has the same thing and they feel it is just a cold. Has now been on prednisone at 5 mg daily dose for 1 week.  I reviewed images- clear. She went back on Diovan for BP and is tolerating that. Dr Eloise Harman gave sample script Elwin Sleight to try instead of Asmanex while she was tight with her cold. She asks what to do with that.   10/15/10-Hx eosinophilic pneumonia. Allergic rhinitis   Husband [place] got sick with bilateral infiltrates c/w recurrent EP and put back on prednisone.  Ended prednisone 2-3 weeks ago then gradually overwhelming fatigue, needing to lie down. Began dry cough again. , Then fever for 2 days. Went to a Dr in [employer]. He did labs and CXR. Fungal titers are pending. WBC went back to 20,000 and O2 sat dropped to 80's, similar to last time.Marland Kitchen He started prednisone 60 x 5 days, now first day of 50 mg /day. Her sat improved quckly and she feels back to normal..  We reveiwed her images from the latest CXR.   11/09/10- 57 yo never smoker followed for  Hx eosinophilic pneumonia. Allergic rhinitis Continues maintenance prednisone after relapse of her lung disease addressed last  visit late June.  She feels nearly baseline. May be just a little weaker on 3-4 flights of stairs at church, but that's it and she recognizes she is more introspective now so some of that may have begun before she got sick. Now down to prednisone 15 mg daily taken as 10 mg AM, 5 mg PM. No concerning changes in her chest so far. Last time she lasted 2-3 weeks off prednisone, with dry cough and infiltrates. CXR 11/09/10- compared with July 31, 2010- We reviewed and compared images together. Today she has new RUL and RML atelectatic infiltrates, best seen on lateral view.  She asked for discussion of steroid side effects, which we repeated in detail today. She thinks they maker her "flighty".  12/14/10- 89 yo never smoker followed for  Hx eosinophilic pneumonia, complicated by hepatitis and renal failure in remission, Allergic rhinitis Maintaining prednisone 15 mg daily and Asmanex. She feels fine. CXR- images reviewed. Stable now with residual density near minor fissure and under hilum, best appreciated on lateral. I pointed out calcification in her aorta and she will discuss statin therapy when she sees Dr Waynard Edwards for annual physical tomorrow.l   12/31/10-  67 yo never smoker followed for  Hx eosinophilic pneumonia, complicated by hepatitis and renal failure in remission, Allergic rhinitis.  01/18/11-  77 yo never smoker followed for  Hx eosinophilic pneumonia, complicated by hepatitis and renal failure in remission, Allergic rhinitis. Wife of Dr [name] She had returned from  a recent trip to [place] during which she and several of those traveling with her caught colds. She alternated prednisone 5 mg with 10 mg every other day while on the trip. For the past week she dropped back to 5 mg daily. She admits a little low-grade fever in the last few days. Cough only scant clear or trace yellow sputum. She denies rash, swollen glands, chest pain joint pain or swelling. IgE level-07/05/2010 was 873.9, July 23  was 17.8, September 28 was 20.8. CXR 01/15/2011 *RADIOLOGY REPORT*  Clinical Data: History of eosinophilic pneumonia.  CHEST - 2 VIEW  Comparison: Chest 12/14/2010.  Findings: There is new extensive airspace opacity the periphery of  the right upper lobe. Small nodular opacity in the left lower lobe  is unchanged. Heart size is normal. No pneumothorax or effusion.  IMPRESSION:  New extensive peripheral airspace opacity in the right upper lobe  is consistent with pneumonia. 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  Original Report Authenticated By: Bernadene Bell. D'ALESSIO, M.D.   02/03/11- 58 yo never smoker followed for  Hx eosinophilic pneumonia, complicated by hepatitis and renal failure in remission, Allergic rhinitis. Wife of Dr [name] She has been alternating prednisone 10 mg with 5 mg every other day for the past 7-10 days. Has had flu vaccine. She feels stable now and has no chest symptoms, except "tiny little cough". She does occasionally stop while on long walks 2 to exertional dyspnea. Other than walking a couple of miles with her husband, she is not exercising. We again reviewed her chest x-ray images from September 28 showing a new right lung infiltrate. She thinks her hair was thinning while on higher dose prednisone, and is coming back now. She is not using Asmanex. CXR 02/03/2011-persistent infiltrate in the right lung. I think it shows partial clearing in that area. There is some less impressive bilateral scarring. She is continuing prednisone alternating 10 with 5 mg every other day  06/14/11- 11 yo never smoker followed for  Hx eosinophilic pneumonia, complicated by hepatitis and renal failure in remission, Allergic rhinitis. Wife of Dr [name] Since last here she had had consultation at [facility] a with Dr. Ramond Dial, but never got a followup report. He didn't PFT. In September she was in [place] where dyspnea on exertion limited walking. She had a cold over new years  with some green sputum. Continues prednisone 10 mg daily until 2 weeks ago, when she dropped to 7-1/2 mg daily. She thinks she is a "great deal better". Denies cough, chest pain, fever, rash or nodes. Not much need for rescue inhaler. Exercises on treadmill. Has been playing golf.  Review of Systems--See HPI Constitutional:   No weight loss, night sweats, fever,    No-chills, fatigue, lassitude. HEENT:   No headaches,  Difficulty swallowing,  Tooth/dental problems, recent sore throat,                No sneezing, itching, ear ache, CV:  No chest pain, orthopnea, PND, swelling in lower extremities, anasarca, dizziness, palpitations GI  No heartburn, indigestion, abdominal pain, nausea, vomiting, diarrhea, change in bowel habits, loss of appetite Resp:   No excess mucus, ,  No coughing up of blood.  No change in color of mucus.  No wheezing. Skin: no rash or lesions. GU:  MS:  No joint pain or swelling.  No decreased range of motion.  No back pain. Psych:  No change in mood or affect. No depression or anxiety.  No memory  loss.      Objective:   Physical Exam General- Alert, Oriented, Affect-appropriate, Distress- none acute Skin- rash-none, lesions- none, excoriation- none Lymphadenopathy- none Head- atraumatic            Eyes- Gross vision intact, PERRLA, conjunctivae clear secretions. 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            Ears- Hearing, canals normal            Nose- Clear, minimal-Septal dev,    no-mucus, polyps, erosion, perforation             Throat- Mallampati II , mucosa clear , drainage- none, tonsils- atrophic. Sniffing. Neck- flexible , trachea midline, no stridor , thyroid nl, carotid no bruit Chest - symmetrical excursion , unlabored           Heart/CV- RRR , no murmur , no gallop  , no rub, nl s1 s2                           - JVD- none , edema- none, stasis changes- none, varices- none           Lung- few scattered crackles,  wheeze- none, cough- none ,  dullness-none, rub- none.           Chest wall-  Abd-  Br/ Gen/ Rectal- Not done, not indicated Extrem- cyanosis- none, clubbing, none, atrophy- none, strength- nl Neuro- grossly intact to observation

## 2011-06-16 NOTE — Assessment & Plan Note (Signed)
Sustained remission over several months on low-dose prednisone. We discussed tapering dose towards 5 mg. We will try to get some results from [facility]/Dr. Ramond Dial.

## 2011-06-21 ENCOUNTER — Ambulatory Visit
Admission: RE | Admit: 2011-06-21 | Discharge: 2011-06-21 | Disposition: A | Discharge status: Home / Self Care | Payer: Medicare Other | Source: Ambulatory Visit | Attending: Internal Medicine | Admitting: Internal Medicine

## 2011-06-21 ENCOUNTER — Telehealth: Payer: Self-pay | Admitting: Internal Medicine

## 2011-06-21 DIAGNOSIS — Z1231 Encounter for screening mammogram for malignant neoplasm of breast: Secondary | ICD-10-CM | POA: Diagnosis not present

## 2011-06-21 NOTE — Progress Notes (Signed)
Quick Note:  Pt aware of results. ______ 

## 2011-06-21 NOTE — Telephone Encounter (Signed)
Pt aware of results 

## 2011-07-19 NOTE — Progress Notes (Signed)
No-show on this date. No charge.

## 2011-08-08 ENCOUNTER — Other Ambulatory Visit: Payer: Self-pay | Admitting: Internal Medicine

## 2011-08-10 NOTE — Telephone Encounter (Signed)
OK to refill

## 2011-08-10 NOTE — Telephone Encounter (Signed)
Please advise if okay to refill; pt is normally on Pred 10 mg alternating 5mg  and 10mg  QOD; pt has this particular RX filled last on 08-14-10. Thanks.

## 2011-08-24 DIAGNOSIS — J309 Allergic rhinitis, unspecified: Secondary | ICD-10-CM | POA: Diagnosis not present

## 2011-08-24 DIAGNOSIS — J189 Pneumonia, unspecified organism: Secondary | ICD-10-CM | POA: Diagnosis not present

## 2011-08-24 DIAGNOSIS — I10 Essential (primary) hypertension: Secondary | ICD-10-CM | POA: Diagnosis not present

## 2011-09-06 ENCOUNTER — Ambulatory Visit (HOSPITAL_COMMUNITY): Payer: Medicare Other | Attending: Internal Medicine

## 2011-09-06 ENCOUNTER — Other Ambulatory Visit (HOSPITAL_COMMUNITY): Payer: Self-pay | Admitting: Internal Medicine

## 2011-09-06 DIAGNOSIS — I350 Nonrheumatic aortic (valve) stenosis: Secondary | ICD-10-CM

## 2011-09-06 DIAGNOSIS — I379 Nonrheumatic pulmonary valve disorder, unspecified: Secondary | ICD-10-CM | POA: Insufficient documentation

## 2011-09-06 DIAGNOSIS — I10 Essential (primary) hypertension: Secondary | ICD-10-CM | POA: Diagnosis not present

## 2011-09-06 DIAGNOSIS — R011 Cardiac murmur, unspecified: Secondary | ICD-10-CM | POA: Diagnosis not present

## 2011-09-06 DIAGNOSIS — I080 Rheumatic disorders of both mitral and aortic valves: Secondary | ICD-10-CM | POA: Diagnosis not present

## 2011-09-06 DIAGNOSIS — E785 Hyperlipidemia, unspecified: Secondary | ICD-10-CM | POA: Insufficient documentation

## 2011-09-06 DIAGNOSIS — I359 Nonrheumatic aortic valve disorder, unspecified: Secondary | ICD-10-CM

## 2011-09-07 ENCOUNTER — Encounter (HOSPITAL_COMMUNITY): Payer: Self-pay | Admitting: Internal Medicine

## 2011-09-14 ENCOUNTER — Ambulatory Visit: Payer: Medicare Other | Admitting: Internal Medicine

## 2011-11-01 DIAGNOSIS — R059 Cough, unspecified: Secondary | ICD-10-CM | POA: Diagnosis not present

## 2011-11-01 DIAGNOSIS — J0110 Acute frontal sinusitis, unspecified: Secondary | ICD-10-CM | POA: Diagnosis not present

## 2011-11-01 DIAGNOSIS — J309 Allergic rhinitis, unspecified: Secondary | ICD-10-CM | POA: Diagnosis not present

## 2011-11-01 DIAGNOSIS — I10 Essential (primary) hypertension: Secondary | ICD-10-CM | POA: Diagnosis not present

## 2011-11-01 DIAGNOSIS — R05 Cough: Secondary | ICD-10-CM | POA: Diagnosis not present

## 2012-01-07 DIAGNOSIS — I10 Essential (primary) hypertension: Secondary | ICD-10-CM | POA: Diagnosis not present

## 2012-01-07 DIAGNOSIS — Z79899 Other long term (current) drug therapy: Secondary | ICD-10-CM | POA: Diagnosis not present

## 2012-01-07 DIAGNOSIS — E559 Vitamin D deficiency, unspecified: Secondary | ICD-10-CM | POA: Diagnosis not present

## 2012-01-07 DIAGNOSIS — E785 Hyperlipidemia, unspecified: Secondary | ICD-10-CM | POA: Diagnosis not present

## 2012-01-17 DIAGNOSIS — Z0000 Encounter for general adult medical examination without abnormal findings: Secondary | ICD-10-CM | POA: Diagnosis not present

## 2012-01-17 DIAGNOSIS — I359 Nonrheumatic aortic valve disorder, unspecified: Secondary | ICD-10-CM | POA: Diagnosis not present

## 2012-01-17 DIAGNOSIS — I10 Essential (primary) hypertension: Secondary | ICD-10-CM | POA: Diagnosis not present

## 2012-01-17 DIAGNOSIS — Z23 Encounter for immunization: Secondary | ICD-10-CM | POA: Diagnosis not present

## 2012-01-17 DIAGNOSIS — Z79899 Other long term (current) drug therapy: Secondary | ICD-10-CM | POA: Diagnosis not present

## 2012-01-19 DIAGNOSIS — L259 Unspecified contact dermatitis, unspecified cause: Secondary | ICD-10-CM | POA: Diagnosis not present

## 2012-01-19 DIAGNOSIS — H25019 Cortical age-related cataract, unspecified eye: Secondary | ICD-10-CM | POA: Diagnosis not present

## 2012-01-19 DIAGNOSIS — R21 Rash and other nonspecific skin eruption: Secondary | ICD-10-CM | POA: Diagnosis not present

## 2012-01-19 DIAGNOSIS — H401290 Low-tension glaucoma, unspecified eye, stage unspecified: Secondary | ICD-10-CM | POA: Diagnosis not present

## 2012-01-19 DIAGNOSIS — H409 Unspecified glaucoma: Secondary | ICD-10-CM | POA: Diagnosis not present

## 2012-01-25 DIAGNOSIS — Z1212 Encounter for screening for malignant neoplasm of rectum: Secondary | ICD-10-CM | POA: Diagnosis not present

## 2012-02-18 DIAGNOSIS — Z23 Encounter for immunization: Secondary | ICD-10-CM | POA: Diagnosis not present

## 2012-02-21 DIAGNOSIS — I10 Essential (primary) hypertension: Secondary | ICD-10-CM | POA: Diagnosis not present

## 2012-02-21 DIAGNOSIS — L509 Urticaria, unspecified: Secondary | ICD-10-CM | POA: Diagnosis not present

## 2012-02-21 DIAGNOSIS — J309 Allergic rhinitis, unspecified: Secondary | ICD-10-CM | POA: Diagnosis not present

## 2012-05-01 DIAGNOSIS — E785 Hyperlipidemia, unspecified: Secondary | ICD-10-CM | POA: Diagnosis not present

## 2012-05-01 DIAGNOSIS — J189 Pneumonia, unspecified organism: Secondary | ICD-10-CM | POA: Diagnosis not present

## 2012-05-01 DIAGNOSIS — L509 Urticaria, unspecified: Secondary | ICD-10-CM | POA: Diagnosis not present

## 2012-05-01 DIAGNOSIS — I10 Essential (primary) hypertension: Secondary | ICD-10-CM | POA: Diagnosis not present

## 2012-06-06 DIAGNOSIS — H409 Unspecified glaucoma: Secondary | ICD-10-CM | POA: Diagnosis not present

## 2012-06-06 DIAGNOSIS — H401290 Low-tension glaucoma, unspecified eye, stage unspecified: Secondary | ICD-10-CM | POA: Diagnosis not present

## 2012-07-11 ENCOUNTER — Other Ambulatory Visit: Payer: Self-pay

## 2012-07-11 DIAGNOSIS — Z1231 Encounter for screening mammogram for malignant neoplasm of breast: Secondary | ICD-10-CM

## 2012-07-18 DIAGNOSIS — L820 Inflamed seborrheic keratosis: Secondary | ICD-10-CM | POA: Diagnosis not present

## 2012-07-18 DIAGNOSIS — L821 Other seborrheic keratosis: Secondary | ICD-10-CM | POA: Diagnosis not present

## 2012-07-18 DIAGNOSIS — L219 Seborrheic dermatitis, unspecified: Secondary | ICD-10-CM | POA: Diagnosis not present

## 2012-08-04 ENCOUNTER — Ambulatory Visit: Payer: Medicare Other

## 2012-08-10 ENCOUNTER — Ambulatory Visit
Admission: RE | Admit: 2012-08-10 | Discharge: 2012-08-10 | Disposition: A | Discharge status: Home / Self Care | Payer: Medicare Other | Source: Ambulatory Visit

## 2012-08-10 DIAGNOSIS — Z1231 Encounter for screening mammogram for malignant neoplasm of breast: Secondary | ICD-10-CM | POA: Diagnosis not present

## 2012-08-18 ENCOUNTER — Ambulatory Visit: Payer: Medicare Other

## 2012-11-25 DIAGNOSIS — H6500 Acute serous otitis media, unspecified ear: Secondary | ICD-10-CM | POA: Diagnosis not present

## 2012-12-07 DIAGNOSIS — H9209 Otalgia, unspecified ear: Secondary | ICD-10-CM | POA: Diagnosis not present

## 2012-12-07 DIAGNOSIS — J330 Polyp of nasal cavity: Secondary | ICD-10-CM | POA: Diagnosis not present

## 2012-12-28 ENCOUNTER — Other Ambulatory Visit: Payer: Self-pay | Admitting: Dermatology

## 2012-12-28 DIAGNOSIS — L821 Other seborrheic keratosis: Secondary | ICD-10-CM | POA: Diagnosis not present

## 2012-12-28 DIAGNOSIS — D485 Neoplasm of uncertain behavior of skin: Secondary | ICD-10-CM | POA: Diagnosis not present

## 2012-12-28 DIAGNOSIS — B079 Viral wart, unspecified: Secondary | ICD-10-CM | POA: Diagnosis not present

## 2012-12-28 DIAGNOSIS — L219 Seborrheic dermatitis, unspecified: Secondary | ICD-10-CM | POA: Diagnosis not present

## 2012-12-28 DIAGNOSIS — L570 Actinic keratosis: Secondary | ICD-10-CM | POA: Diagnosis not present

## 2012-12-28 DIAGNOSIS — L820 Inflamed seborrheic keratosis: Secondary | ICD-10-CM | POA: Diagnosis not present

## 2013-01-09 ENCOUNTER — Encounter (HOSPITAL_COMMUNITY): Payer: Self-pay | Admitting: Emergency Medicine

## 2013-01-09 ENCOUNTER — Emergency Department (HOSPITAL_COMMUNITY): Payer: Medicare Other

## 2013-01-09 ENCOUNTER — Emergency Department (HOSPITAL_COMMUNITY)
Admission: EM | Admit: 2013-01-09 | Discharge: 2013-01-09 | Disposition: A | Discharge status: Home / Self Care | Payer: Medicare Other | Attending: Emergency Medicine | Admitting: Emergency Medicine

## 2013-01-09 DIAGNOSIS — H1045 Other chronic allergic conjunctivitis: Secondary | ICD-10-CM | POA: Diagnosis not present

## 2013-01-09 DIAGNOSIS — R109 Unspecified abdominal pain: Secondary | ICD-10-CM | POA: Diagnosis not present

## 2013-01-09 DIAGNOSIS — Z6825 Body mass index (BMI) 25.0-25.9, adult: Secondary | ICD-10-CM | POA: Diagnosis not present

## 2013-01-09 DIAGNOSIS — Z880 Allergy status to penicillin: Secondary | ICD-10-CM | POA: Insufficient documentation

## 2013-01-09 DIAGNOSIS — R9389 Abnormal findings on diagnostic imaging of other specified body structures: Secondary | ICD-10-CM | POA: Diagnosis not present

## 2013-01-09 DIAGNOSIS — N289 Disorder of kidney and ureter, unspecified: Secondary | ICD-10-CM | POA: Diagnosis not present

## 2013-01-09 DIAGNOSIS — R110 Nausea: Secondary | ICD-10-CM | POA: Insufficient documentation

## 2013-01-09 DIAGNOSIS — K5712 Diverticulitis of small intestine without perforation or abscess without bleeding: Secondary | ICD-10-CM

## 2013-01-09 DIAGNOSIS — Z79899 Other long term (current) drug therapy: Secondary | ICD-10-CM | POA: Insufficient documentation

## 2013-01-09 DIAGNOSIS — J328 Other chronic sinusitis: Secondary | ICD-10-CM | POA: Diagnosis not present

## 2013-01-09 DIAGNOSIS — I10 Essential (primary) hypertension: Secondary | ICD-10-CM | POA: Diagnosis not present

## 2013-01-09 DIAGNOSIS — IMO0002 Reserved for concepts with insufficient information to code with codable children: Secondary | ICD-10-CM | POA: Diagnosis not present

## 2013-01-09 DIAGNOSIS — K5730 Diverticulosis of large intestine without perforation or abscess without bleeding: Secondary | ICD-10-CM | POA: Diagnosis not present

## 2013-01-09 DIAGNOSIS — R1013 Epigastric pain: Secondary | ICD-10-CM | POA: Diagnosis not present

## 2013-01-09 DIAGNOSIS — R1011 Right upper quadrant pain: Secondary | ICD-10-CM | POA: Diagnosis not present

## 2013-01-09 HISTORY — DX: Essential (primary) hypertension: I10

## 2013-01-09 HISTORY — DX: Diverticulosis of intestine, part unspecified, without perforation or abscess without bleeding: K57.90

## 2013-01-09 HISTORY — DX: Irritable bowel syndrome, unspecified: K58.9

## 2013-01-09 LAB — CBC WITH DIFFERENTIAL/PLATELET
Basophils Absolute: 0.1 10*3/uL (ref 0.0–0.1)
Eosinophils Absolute: 0.8 10*3/uL — ABNORMAL HIGH (ref 0.0–0.7)
Eosinophils Relative: 6 % — ABNORMAL HIGH (ref 0–5)
HCT: 36.5 % (ref 36.0–46.0)
Lymphocytes Relative: 9 % — ABNORMAL LOW (ref 12–46)
Lymphs Abs: 1.2 10*3/uL (ref 0.7–4.0)
MCH: 31.5 pg (ref 26.0–34.0)
MCHC: 35.1 g/dL (ref 30.0–36.0)
MCV: 89.9 fL (ref 78.0–100.0)
Monocytes Relative: 6 % (ref 3–12)
Neutrophils Relative %: 79 % — ABNORMAL HIGH (ref 43–77)
Platelets: 292 10*3/uL (ref 150–400)
RDW: 14.4 % (ref 11.5–15.5)

## 2013-01-09 LAB — URINALYSIS, ROUTINE W REFLEX MICROSCOPIC
Bilirubin Urine: NEGATIVE
Glucose, UA: NEGATIVE mg/dL
Hgb urine dipstick: NEGATIVE
Ketones, ur: NEGATIVE mg/dL
Leukocytes, UA: NEGATIVE
Nitrite: POSITIVE — AB
Protein, ur: NEGATIVE mg/dL
pH: 5 (ref 5.0–8.0)

## 2013-01-09 LAB — COMPREHENSIVE METABOLIC PANEL
ALT: 16 U/L (ref 0–35)
AST: 20 U/L (ref 0–37)
Albumin: 4.1 g/dL (ref 3.5–5.2)
Alkaline Phosphatase: 69 U/L (ref 39–117)
Chloride: 104 mEq/L (ref 96–112)
GFR calc non Af Amer: 83 mL/min — ABNORMAL LOW (ref >90)
Potassium: 3.5 mEq/L (ref 3.5–5.1)
Sodium: 138 mEq/L (ref 135–145)
Total Protein: 6.8 g/dL (ref 6.0–8.3)

## 2013-01-09 LAB — URINE MICROSCOPIC-ADD ON

## 2013-01-09 LAB — LIPASE, BLOOD: Lipase: 35 U/L (ref 11–59)

## 2013-01-09 LAB — TROPONIN I: Troponin I: <0.3 ng/mL (ref <0.30)

## 2013-01-09 MED ORDER — HYDROMORPHONE HCL PF 1 MG/ML IJ SOLN
0.5000 mg | Freq: Once | INTRAMUSCULAR | Status: AC
  Administered 2013-01-09: 0.5 mg via INTRAVENOUS
  Filled 2013-01-09: qty 1

## 2013-01-09 MED ORDER — METRONIDAZOLE 500 MG PO TABS: 500.0000 mg | ORAL_TABLET | Freq: Two times a day (BID) | ORAL | Status: DC

## 2013-01-09 MED ORDER — CIPROFLOXACIN HCL 500 MG PO TABS: 500.0000 mg | ORAL_TABLET | Freq: Two times a day (BID) | ORAL | Status: DC

## 2013-01-09 MED ORDER — METRONIDAZOLE 500 MG PO TABS
500.0000 mg | ORAL_TABLET | Freq: Once | ORAL | Status: AC
  Administered 2013-01-09: 500 mg via ORAL
  Filled 2013-01-09: qty 1

## 2013-01-09 MED ORDER — IOHEXOL 300 MG/ML  SOLN
100.0000 mL | Freq: Once | INTRAMUSCULAR | Status: AC | PRN
  Administered 2013-01-09: 100 mL via INTRAVENOUS

## 2013-01-09 MED ORDER — CIPROFLOXACIN HCL 500 MG PO TABS
500.0000 mg | ORAL_TABLET | Freq: Once | ORAL | Status: AC
  Administered 2013-01-09: 500 mg via ORAL
  Filled 2013-01-09: qty 1

## 2013-01-09 MED ORDER — HYDROCODONE-ACETAMINOPHEN 5-325 MG PO TABS: 1.0000 | ORAL_TABLET | ORAL | Status: DC | PRN

## 2013-01-09 MED ORDER — ONDANSETRON HCL 8 MG PO TABS: 8.0000 mg | ORAL_TABLET | Freq: Three times a day (TID) | ORAL | Status: DC | PRN

## 2013-01-09 MED ORDER — ONDANSETRON HCL 4 MG/2ML IJ SOLN
4.0000 mg | Freq: Once | INTRAMUSCULAR | Status: AC
  Administered 2013-01-09: 4 mg via INTRAVENOUS
  Filled 2013-01-09: qty 2

## 2013-01-09 MED ORDER — SODIUM CHLORIDE 0.9 % IV SOLN
INTRAVENOUS | Status: DC
  Administered 2013-01-09 (×2): via INTRAVENOUS

## 2013-01-09 MED ORDER — IOHEXOL 300 MG/ML  SOLN
50.0000 mL | Freq: Once | INTRAMUSCULAR | Status: AC | PRN
  Administered 2013-01-09: 50 mL via ORAL

## 2013-01-09 NOTE — ED Notes (Signed)
Pt made aware of need for urine specimen 

## 2013-01-09 NOTE — ED Provider Notes (Signed)
CSN: [id number]     Arrival date & time 01/09/13  1015 History   First MD Initiated Contact with Patient 01/09/13 1043     Chief Complaint  Patient presents with  . Abdominal Pain   (Consider location/radiation/quality/duration/timing/severity/associated sxs/prior Treatment) HPI Complains of epigastric pain radiating to right flank onset gradually last night accompanied by nausea, no vomiting. Pain started after she ate a greasy meal. Last bowel movement yesterday, normal. She treated herself with Prevacid and TUMS this morning with partial relief. Presently pain is moderate, dull. Nothing she does makes pain better or worse. No other associated symptoms. Past Medical History  Diagnosis Date  . Bronchitis, not specified as acute or chronic   . Other chronic sinusitis   . Other chronic allergic conjunctivitis   . Allergic rhinitis, cause unspecified   . Eosinophilic pneumonia     [facility] 3/16-30/12  . Acute renal insufficiency     due to amphotericin 2012  . Irritable bowel   . Hypertension   . Diverticulosis    Past Surgical History  Procedure Laterality Date  . Cesarean section      x 4  . Vesicovaginal fistula closure w/ tah    . Breast lumpectomy      Right-benign  . Tonsillectomy    . Appendectomy    . Bronchoscopy      Video bronch w/ TBBX 2012   Family History  Problem Relation Age of Onset  . Aortic aneurysm Father   . Heart failure Mother    History  Substance Use Topics  . Smoking status: Never Smoker   . Smokeless tobacco: Never Used  . Alcohol Use: Yes   OB History   Grav Para Term Preterm Abortions TAB SAB Ect Mult Living                 Review of Systems  Constitutional: Negative.   HENT: Negative.   Respiratory: Negative.   Cardiovascular: Negative.   Gastrointestinal: Positive for nausea and abdominal pain.  Musculoskeletal: Negative.   Skin: Negative.   Neurological: Negative.   Psychiatric/Behavioral: Negative.   All other systems reviewed  and are negative.    Allergies  Penicillins  Home Medications   Current Outpatient Rx  Name  Route  Sig  Dispense  Refill  . atorvastatin (LIPITOR) 10 MG tablet   Oral   Take 10 mg by mouth daily.          Marland Kitchen BIOTIN 5000 PO   Oral   Take 1 capsule by mouth daily.           . Cholecalciferol (VITAMIN D PO)   Oral   Take 1 tablet by mouth daily.          Marland Kitchen estrogens, conjugated, (PREMARIN) 0.3 MG tablet   Oral   Take 0.3 mg by mouth every other day.          . fexofenadine (ALLEGRA) 180 MG tablet   Oral   Take 180 mg by mouth daily.         Marland Kitchen latanoprost (XALATAN) 0.005 % ophthalmic solution   Both Eyes   Place 1 drop into both eyes daily.          Marland Kitchen losartan (COZAAR) 50 MG tablet   Oral   Take 50 mg by mouth daily.          . predniSONE (DELTASONE) 1 MG tablet   Oral   Take 1 mg by mouth every other day.         Marland Kitchen  triamcinolone (NASACORT AQ) 55 MCG/ACT nasal inhaler   Nasal   Place 1-2 sprays into the nose daily.           BP 206/86  Pulse 68  Temp(Src) 97.7 F (36.5 C) (Oral)  Resp 16  SpO2 100% Physical Exam  Nursing note and vitals reviewed. Constitutional: She appears well-developed and well-nourished.  HENT:  Head: Normocephalic and atraumatic.  Eyes: Conjunctivae are normal. Pupils are equal, round, and reactive to light.  Neck: Neck supple. No tracheal deviation present. No thyromegaly present.  Cardiovascular: Normal rate and regular rhythm.   No murmur heard. Pulmonary/Chest: Effort normal and breath sounds normal.  Abdominal: Soft. Bowel sounds are normal. She exhibits no distension and no mass. There is tenderness. There is no rebound and no guarding.  Tender right upper quadrant. Negative Murphy's sign  Musculoskeletal: Normal range of motion. She exhibits no edema and no tenderness.  Neurological: She is alert. Coordination normal.  Skin: Skin is warm and dry. No rash noted.  Psychiatric: She has a normal mood and  affect.    ED Course  Procedures (including critical care time) Labs Review Labs Reviewed - No data to display Imaging Review No results found.  Date: 01/09/2013  Rate: 65  Rhythm: normal sinus rhythm  QRS Axis: normal  Intervals: normal  ST/T Wave abnormalities: normal  Conduction Disutrbances: none  Narrative Interpretation: unremarkable Results for orders placed during the hospital encounter of 01/09/13  COMPREHENSIVE METABOLIC PANEL      Result Value Range   Sodium 138  135 - 145 mEq/L   Potassium 3.5  3.5 - 5.1 mEq/L   Chloride 104  96 - 112 mEq/L   CO2 24  19 - 32 mEq/L   Glucose, Bld 106 (*) 70 - 99 mg/dL   BUN 17  6 - 23 mg/dL   Creatinine, Ser 1.61  0.50 - 1.10 mg/dL   Calcium 9.5  8.4 - 09.6 mg/dL   Total Protein 6.8  6.0 - 8.3 g/dL   Albumin 4.1  3.5 - 5.2 g/dL   AST 20  0 - 37 U/L   ALT 16  0 - 35 U/L   Alkaline Phosphatase 69  39 - 117 U/L   Total Bilirubin 0.9  0.3 - 1.2 mg/dL   GFR calc non Af Amer 83 (*) >90 mL/min   GFR calc Af Amer >90  >90 mL/min  CBC WITH DIFFERENTIAL      Result Value Range   WBC 13.9 (*) 4.0 - 10.5 K/uL   RBC 4.06  3.87 - 5.11 MIL/uL   Hemoglobin 12.8  12.0 - 15.0 g/dL   HCT 04.5  40.9 - 81.1 %   MCV 89.9  78.0 - 100.0 fL   MCH 31.5  26.0 - 34.0 pg   MCHC 35.1  30.0 - 36.0 g/dL   RDW 91.4  78.2 - 95.6 %   Platelets 292  150 - 400 K/uL   Neutrophils Relative % 79 (*) 43 - 77 %   Neutro Abs 11.0 (*) 1.7 - 7.7 K/uL   Lymphocytes Relative 9 (*) 12 - 46 %   Lymphs Abs 1.2  0.7 - 4.0 K/uL   Monocytes Relative 6  3 - 12 %   Monocytes Absolute 0.9  0.1 - 1.0 K/uL   Eosinophils Relative 6 (*) 0 - 5 %   Eosinophils Absolute 0.8 (*) 0.0 - 0.7 K/uL   Basophils Relative 0  0 - 1 %   Basophils  Absolute 0.1  0.0 - 0.1 K/uL  LIPASE, BLOOD      Result Value Range   Lipase 35  11 - 59 U/L  URINALYSIS, ROUTINE W REFLEX MICROSCOPIC      Result Value Range   Color, Urine YELLOW  YELLOW   APPearance CLOUDY (*) CLEAR   Specific Gravity,  Urine 1.020  1.005 - 1.030   pH 5.0  5.0 - 8.0   Glucose, UA NEGATIVE  NEGATIVE mg/dL   Hgb urine dipstick NEGATIVE  NEGATIVE   Bilirubin Urine NEGATIVE  NEGATIVE   Ketones, ur NEGATIVE  NEGATIVE mg/dL   Protein, ur NEGATIVE  NEGATIVE mg/dL   Urobilinogen, UA 0.2  0.0 - 1.0 mg/dL   Nitrite POSITIVE (*) NEGATIVE   Leukocytes, UA NEGATIVE  NEGATIVE  TROPONIN I      Result Value Range   Troponin I <0.30  <0.30 ng/mL  URINE MICROSCOPIC-ADD ON      Result Value Range   Squamous Epithelial / LPF RARE  RARE   WBC, UA 0-2  <3 WBC/hpf   RBC / HPF 0-2  <3 RBC/hpf   Bacteria, UA MANY (*) RARE   US Abdomen Complete  01/09/2013   *RADIOLOGY REPORT*  Clinical Data:  Right upper quadrant pain and tenderness  ABDOMINAL ULTRASOUND COMPLETE  Comparison:  Abdominal MRI 07/06/2010; prior CT abdomen 03/30/2004  Findings:  Gallbladder:  No gallstones, gallbladder wall thickening, or pericholecystic fluid.  Common Bile Duct:  Within normal limits in caliber at 4.9 mm.  Liver: A 1.3 x 1.3 x 1.5 cm hypoechoic mass with peripheral echogenic ring is identified centrally within the right hepatic lobe.  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  Within normal limits in parenchymal echogenicity.  IVC:  Appears normal.  Pancreas:  No abnormality identified.  Spleen:  Within normal limits in size (5.2 cm) and echotexture.  Right kidney:  Normal in size (10.6 cm) and parenchymal echogenicity.  No evidence of mass or hydronephrosis.  Left kidney:  Normal in size (11.8 cm) and parenchymal echogenicity.  No evidence of solid mass or hydronephrosis.  A partially imaged hypoechoic 1.1 cm lesion in the interpolar region of the kidney was difficult to image.  Correlation with prior imaging demonstrates a simple cyst in this region.  Abdominal Aorta:  No aneurysm identified.  IMPRESSION:  No acute abnormality.   Original Report Authenticated By: Malachy Moan, M.D.   Ct Abdomen Pelvis W Contrast  01/09/2013    CLINICAL DATA:  Mid abdominal pain. Nausea. History of diverticulosis.  EXAM: CT ABDOMEN AND PELVIS WITH CONTRAST  TECHNIQUE: Multidetector CT imaging of the abdomen and pelvis was performed using the standard protocol following bolus administration of intravenous contrast.  CONTRAST:  50mL OMNIPAQUE IOHEXOL 300 MG/ML SOLN, OMNIPAQUE IOHEXOL 300 MG/ML SOLN  COMPARISON:  CT abdomen 03/30/2004.  FINDINGS: Lung Bases: Dependent atelectasis at the lung bases. Coronary artery atherosclerosis is present. If office based assessment of coronary risk factors has not been performed, it is now recommended. Mitral annular calcification. Aortic calcification is present without visualized aneurysm.  Liver:  Normal.  Spleen:  Normal.  Gallbladder:  No calcified stones. No inflammatory changes.  Common bile duct:  Normal.  Pancreas:  Normal.  Adrenal glands:  Normal bilaterally.  Kidneys: In the lower polar the left kidney, there is an indeterminate cystic lesion. This demonstrates mild enhancement after contrast administration, increasing on the delayed images. Followup renal MRI is recommended for further assessment. This lesion  measures 16 mm long axis. The right kidney appears within normal limits. Both ureters are within normal limits.  Stomach:  Distended with contrast.  Small bowel: There is an inflamed a duodenal diverticulum with a rind of soft tissue thickening extending into the mesenteric fat. This arises from the superior margin of the 4th part of the duodenum at the ligament of Treitz. This measures 35 mm transverse, 48 mm craniocaudal and 40 mm AP. Uncomplicated duodenal diverticulum was identified on the prior examination. There is no obstruction. Distal small bowel is within normal limits. No perforation or intra-abdominal free air.  Colon: The appendix is not identified. Colonic diverticulosis is present without diverticulitis.  Pelvic Genitourinary: Collapsed urinary bladder. No free fluid in the pelvis.  Hysterectomy.  Bones: No aggressive osseous lesions. L5-S1 predominant lumbar spondylosis. Bilateral SI joint degenerative disease.  Vasculature: Atherosclerosis.  No acute vascular abnormality.  Body Wall: Small fat containing periumbilical hernia.  IMPRESSION: 1. Inflamed duodenal diverticulum arising from the 4th part of the duodenum in the retroperitoneum consistent with duodenal diverticulitis. No perforation. The diverticulum is packed with debris which appears feculent. 2. Indeterminate 16 mm left interpolar renal cystic lesion. Followup renal MRI with and without contrast recommended. Non-emergent MRI should be deferred until patient has been discharged for the acute illness, and can optimally cooperate with positioning and breath-holding instructions.   Electronically Signed   By: Andreas Newport M.D.   On: 01/09/2013 14:46     3:30 PM patient feels ready to go home after treatment in the ED. She's been informed about the incidental lesion on left kidney and need for followup. MDM  No diagnosis found. Symptoms are mild. I feel that she can be treated as an outpatient. She and her husband are in agreement. Plan prescriptions Norco, Zofran, Flagyl, Cipro. Followup Dr. Waynard Edwards Diagnosis #1  Diverticulitis #2 incidental cystic lesion left kidney  #3 hypertension   Doug Sou, MD 01/09/13 1538

## 2013-01-09 NOTE — ED Notes (Addendum)
Pt states she began to have central abd pain that began last night and moved to back this am. Denies emesis or diarrhea, but states she is nauseous. Pt sent by Dr. Waynard Edwards.

## 2013-01-09 NOTE — ED Notes (Signed)
MD at bedside. 

## 2013-01-10 ENCOUNTER — Other Ambulatory Visit: Payer: Self-pay | Admitting: Internal Medicine

## 2013-01-10 DIAGNOSIS — R109 Unspecified abdominal pain: Secondary | ICD-10-CM

## 2013-01-22 DIAGNOSIS — Z23 Encounter for immunization: Secondary | ICD-10-CM | POA: Diagnosis not present

## 2013-02-12 DIAGNOSIS — I10 Essential (primary) hypertension: Secondary | ICD-10-CM | POA: Diagnosis not present

## 2013-02-12 DIAGNOSIS — E559 Vitamin D deficiency, unspecified: Secondary | ICD-10-CM | POA: Diagnosis not present

## 2013-02-12 DIAGNOSIS — E785 Hyperlipidemia, unspecified: Secondary | ICD-10-CM | POA: Diagnosis not present

## 2013-02-15 DIAGNOSIS — H401290 Low-tension glaucoma, unspecified eye, stage unspecified: Secondary | ICD-10-CM | POA: Diagnosis not present

## 2013-02-15 DIAGNOSIS — H409 Unspecified glaucoma: Secondary | ICD-10-CM | POA: Diagnosis not present

## 2013-02-15 DIAGNOSIS — L570 Actinic keratosis: Secondary | ICD-10-CM | POA: Diagnosis not present

## 2013-02-15 DIAGNOSIS — L821 Other seborrheic keratosis: Secondary | ICD-10-CM | POA: Diagnosis not present

## 2013-02-19 ENCOUNTER — Ambulatory Visit (HOSPITAL_COMMUNITY)
Admission: RE | Admit: 2013-02-19 | Discharge: 2013-02-19 | Disposition: A | Discharge status: Home / Self Care | Payer: Medicare Other | Source: Ambulatory Visit | Attending: Surgery | Admitting: Surgery

## 2013-02-19 ENCOUNTER — Other Ambulatory Visit (HOSPITAL_COMMUNITY): Payer: Self-pay | Admitting: Internal Medicine

## 2013-02-19 ENCOUNTER — Ambulatory Visit
Admission: RE | Admit: 2013-02-19 | Discharge: 2013-02-19 | Disposition: A | Discharge status: Home / Self Care | Payer: Medicare Other | Source: Ambulatory Visit | Attending: Internal Medicine | Admitting: Internal Medicine

## 2013-02-19 DIAGNOSIS — M7989 Other specified soft tissue disorders: Secondary | ICD-10-CM

## 2013-02-19 DIAGNOSIS — N281 Cyst of kidney, acquired: Secondary | ICD-10-CM | POA: Diagnosis not present

## 2013-02-19 DIAGNOSIS — R109 Unspecified abdominal pain: Secondary | ICD-10-CM

## 2013-02-19 MED ORDER — GADOBENATE DIMEGLUMINE 529 MG/ML IV SOLN
12.0000 mL | Freq: Once | INTRAVENOUS | Status: AC | PRN
  Administered 2013-02-19: 12 mL via INTRAVENOUS

## 2013-02-22 DIAGNOSIS — Z1212 Encounter for screening for malignant neoplasm of rectum: Secondary | ICD-10-CM | POA: Diagnosis not present

## 2013-03-07 DIAGNOSIS — L821 Other seborrheic keratosis: Secondary | ICD-10-CM | POA: Diagnosis not present

## 2013-03-07 DIAGNOSIS — L570 Actinic keratosis: Secondary | ICD-10-CM | POA: Diagnosis not present

## 2013-03-14 DIAGNOSIS — Z79899 Other long term (current) drug therapy: Secondary | ICD-10-CM | POA: Diagnosis not present

## 2013-03-14 DIAGNOSIS — J45909 Unspecified asthma, uncomplicated: Secondary | ICD-10-CM | POA: Diagnosis not present

## 2013-03-14 DIAGNOSIS — E785 Hyperlipidemia, unspecified: Secondary | ICD-10-CM | POA: Diagnosis not present

## 2013-03-14 DIAGNOSIS — I359 Nonrheumatic aortic valve disorder, unspecified: Secondary | ICD-10-CM | POA: Diagnosis not present

## 2013-03-14 DIAGNOSIS — I10 Essential (primary) hypertension: Secondary | ICD-10-CM | POA: Diagnosis not present

## 2013-03-14 DIAGNOSIS — Z23 Encounter for immunization: Secondary | ICD-10-CM | POA: Diagnosis not present

## 2013-03-14 DIAGNOSIS — K5730 Diverticulosis of large intestine without perforation or abscess without bleeding: Secondary | ICD-10-CM | POA: Diagnosis not present

## 2013-03-14 DIAGNOSIS — E559 Vitamin D deficiency, unspecified: Secondary | ICD-10-CM | POA: Diagnosis not present

## 2013-03-14 DIAGNOSIS — Z0000 Encounter for general adult medical examination without abnormal findings: Secondary | ICD-10-CM | POA: Diagnosis not present

## 2013-04-24 DIAGNOSIS — Z780 Asymptomatic menopausal state: Secondary | ICD-10-CM | POA: Diagnosis not present

## 2013-04-26 DIAGNOSIS — J309 Allergic rhinitis, unspecified: Secondary | ICD-10-CM | POA: Diagnosis not present

## 2013-04-26 DIAGNOSIS — J069 Acute upper respiratory infection, unspecified: Secondary | ICD-10-CM | POA: Diagnosis not present

## 2013-05-17 DIAGNOSIS — Z1211 Encounter for screening for malignant neoplasm of colon: Secondary | ICD-10-CM | POA: Diagnosis not present

## 2013-05-17 DIAGNOSIS — K5732 Diverticulitis of large intestine without perforation or abscess without bleeding: Secondary | ICD-10-CM | POA: Diagnosis not present

## 2013-05-17 DIAGNOSIS — Z8719 Personal history of other diseases of the digestive system: Secondary | ICD-10-CM | POA: Diagnosis not present

## 2013-05-17 DIAGNOSIS — K5730 Diverticulosis of large intestine without perforation or abscess without bleeding: Secondary | ICD-10-CM | POA: Diagnosis not present

## 2013-05-17 DIAGNOSIS — K625 Hemorrhage of anus and rectum: Secondary | ICD-10-CM | POA: Diagnosis not present

## 2013-06-11 DIAGNOSIS — H409 Unspecified glaucoma: Secondary | ICD-10-CM | POA: Diagnosis not present

## 2013-06-11 DIAGNOSIS — H5340 Unspecified visual field defects: Secondary | ICD-10-CM | POA: Diagnosis not present

## 2013-06-11 DIAGNOSIS — H401290 Low-tension glaucoma, unspecified eye, stage unspecified: Secondary | ICD-10-CM | POA: Diagnosis not present

## 2013-06-11 DIAGNOSIS — H47239 Glaucomatous optic atrophy, unspecified eye: Secondary | ICD-10-CM | POA: Diagnosis not present

## 2013-06-15 DIAGNOSIS — Z1331 Encounter for screening for depression: Secondary | ICD-10-CM | POA: Diagnosis not present

## 2013-06-15 DIAGNOSIS — R946 Abnormal results of thyroid function studies: Secondary | ICD-10-CM | POA: Diagnosis not present

## 2013-06-15 DIAGNOSIS — IMO0002 Reserved for concepts with insufficient information to code with codable children: Secondary | ICD-10-CM | POA: Diagnosis not present

## 2013-06-15 DIAGNOSIS — I10 Essential (primary) hypertension: Secondary | ICD-10-CM | POA: Diagnosis not present

## 2013-06-15 DIAGNOSIS — I359 Nonrheumatic aortic valve disorder, unspecified: Secondary | ICD-10-CM | POA: Diagnosis not present

## 2013-07-06 ENCOUNTER — Other Ambulatory Visit: Payer: Self-pay

## 2013-07-06 DIAGNOSIS — Z1231 Encounter for screening mammogram for malignant neoplasm of breast: Secondary | ICD-10-CM

## 2013-07-09 ENCOUNTER — Other Ambulatory Visit (HOSPITAL_COMMUNITY): Payer: Self-pay | Admitting: Internal Medicine

## 2013-07-09 ENCOUNTER — Encounter: Payer: Self-pay | Admitting: Cardiology

## 2013-07-09 ENCOUNTER — Ambulatory Visit (HOSPITAL_COMMUNITY): Payer: Medicare Other | Attending: Cardiology | Admitting: Radiology

## 2013-07-09 DIAGNOSIS — I359 Nonrheumatic aortic valve disorder, unspecified: Secondary | ICD-10-CM

## 2013-07-09 NOTE — Progress Notes (Signed)
Echocardiogram performed.  

## 2013-08-17 ENCOUNTER — Ambulatory Visit
Admission: RE | Admit: 2013-08-17 | Discharge: 2013-08-17 | Disposition: A | Discharge status: Home / Self Care | Payer: Medicare Other | Source: Ambulatory Visit

## 2013-08-17 ENCOUNTER — Encounter (INDEPENDENT_AMBULATORY_CARE_PROVIDER_SITE_OTHER): Payer: Self-pay

## 2013-08-17 DIAGNOSIS — Z1231 Encounter for screening mammogram for malignant neoplasm of breast: Secondary | ICD-10-CM | POA: Diagnosis not present

## 2013-09-24 DIAGNOSIS — H401290 Low-tension glaucoma, unspecified eye, stage unspecified: Secondary | ICD-10-CM | POA: Diagnosis not present

## 2013-09-24 DIAGNOSIS — H259 Unspecified age-related cataract: Secondary | ICD-10-CM | POA: Diagnosis not present

## 2013-09-24 DIAGNOSIS — H409 Unspecified glaucoma: Secondary | ICD-10-CM | POA: Diagnosis not present

## 2013-12-17 DIAGNOSIS — H04129 Dry eye syndrome of unspecified lacrimal gland: Secondary | ICD-10-CM | POA: Diagnosis not present

## 2013-12-17 DIAGNOSIS — H5310 Unspecified subjective visual disturbances: Secondary | ICD-10-CM | POA: Diagnosis not present

## 2013-12-17 DIAGNOSIS — H1045 Other chronic allergic conjunctivitis: Secondary | ICD-10-CM | POA: Diagnosis not present

## 2013-12-17 DIAGNOSIS — H259 Unspecified age-related cataract: Secondary | ICD-10-CM | POA: Diagnosis not present

## 2014-01-17 DIAGNOSIS — Z23 Encounter for immunization: Secondary | ICD-10-CM | POA: Diagnosis not present

## 2014-03-13 DIAGNOSIS — H43813 Vitreous degeneration, bilateral: Secondary | ICD-10-CM | POA: Diagnosis not present

## 2014-03-13 DIAGNOSIS — H52203 Unspecified astigmatism, bilateral: Secondary | ICD-10-CM | POA: Diagnosis not present

## 2014-03-13 DIAGNOSIS — H401232 Low-tension glaucoma, bilateral, moderate stage: Secondary | ICD-10-CM | POA: Diagnosis not present

## 2014-03-13 DIAGNOSIS — H2513 Age-related nuclear cataract, bilateral: Secondary | ICD-10-CM | POA: Diagnosis not present

## 2014-03-18 DIAGNOSIS — Z008 Encounter for other general examination: Secondary | ICD-10-CM | POA: Diagnosis not present

## 2014-03-18 DIAGNOSIS — Z0000 Encounter for general adult medical examination without abnormal findings: Secondary | ICD-10-CM | POA: Diagnosis not present

## 2014-03-18 DIAGNOSIS — I10 Essential (primary) hypertension: Secondary | ICD-10-CM | POA: Diagnosis not present

## 2014-03-18 DIAGNOSIS — E559 Vitamin D deficiency, unspecified: Secondary | ICD-10-CM | POA: Diagnosis not present

## 2014-03-18 DIAGNOSIS — R946 Abnormal results of thyroid function studies: Secondary | ICD-10-CM | POA: Diagnosis not present

## 2014-03-18 DIAGNOSIS — R300 Dysuria: Secondary | ICD-10-CM | POA: Diagnosis not present

## 2014-03-25 DIAGNOSIS — Z23 Encounter for immunization: Secondary | ICD-10-CM | POA: Diagnosis not present

## 2014-03-25 DIAGNOSIS — Z79899 Other long term (current) drug therapy: Secondary | ICD-10-CM | POA: Diagnosis not present

## 2014-03-25 DIAGNOSIS — Z1389 Encounter for screening for other disorder: Secondary | ICD-10-CM | POA: Diagnosis not present

## 2014-03-25 DIAGNOSIS — R946 Abnormal results of thyroid function studies: Secondary | ICD-10-CM | POA: Diagnosis not present

## 2014-03-25 DIAGNOSIS — Z0000 Encounter for general adult medical examination without abnormal findings: Secondary | ICD-10-CM | POA: Diagnosis not present

## 2014-03-25 DIAGNOSIS — J45909 Unspecified asthma, uncomplicated: Secondary | ICD-10-CM | POA: Diagnosis not present

## 2014-03-25 DIAGNOSIS — J309 Allergic rhinitis, unspecified: Secondary | ICD-10-CM | POA: Diagnosis not present

## 2014-03-25 DIAGNOSIS — E559 Vitamin D deficiency, unspecified: Secondary | ICD-10-CM | POA: Diagnosis not present

## 2014-03-25 DIAGNOSIS — L509 Urticaria, unspecified: Secondary | ICD-10-CM | POA: Diagnosis not present

## 2014-03-25 DIAGNOSIS — E876 Hypokalemia: Secondary | ICD-10-CM | POA: Diagnosis not present

## 2014-03-27 DIAGNOSIS — D2272 Melanocytic nevi of left lower limb, including hip: Secondary | ICD-10-CM | POA: Diagnosis not present

## 2014-03-27 DIAGNOSIS — Z1212 Encounter for screening for malignant neoplasm of rectum: Secondary | ICD-10-CM | POA: Diagnosis not present

## 2014-03-27 DIAGNOSIS — D2261 Melanocytic nevi of right upper limb, including shoulder: Secondary | ICD-10-CM | POA: Diagnosis not present

## 2014-03-27 DIAGNOSIS — L821 Other seborrheic keratosis: Secondary | ICD-10-CM | POA: Diagnosis not present

## 2014-03-27 DIAGNOSIS — D2262 Melanocytic nevi of left upper limb, including shoulder: Secondary | ICD-10-CM | POA: Diagnosis not present

## 2014-03-27 DIAGNOSIS — D225 Melanocytic nevi of trunk: Secondary | ICD-10-CM | POA: Diagnosis not present

## 2014-03-27 DIAGNOSIS — B351 Tinea unguium: Secondary | ICD-10-CM | POA: Diagnosis not present

## 2014-03-27 DIAGNOSIS — L814 Other melanin hyperpigmentation: Secondary | ICD-10-CM | POA: Diagnosis not present

## 2014-03-27 DIAGNOSIS — D2271 Melanocytic nevi of right lower limb, including hip: Secondary | ICD-10-CM | POA: Diagnosis not present

## 2014-04-02 DIAGNOSIS — H2511 Age-related nuclear cataract, right eye: Secondary | ICD-10-CM | POA: Diagnosis not present

## 2014-04-23 DIAGNOSIS — H402210 Chronic angle-closure glaucoma, right eye, stage unspecified: Secondary | ICD-10-CM | POA: Diagnosis not present

## 2014-04-23 DIAGNOSIS — H25031 Anterior subcapsular polar age-related cataract, right eye: Secondary | ICD-10-CM | POA: Diagnosis not present

## 2014-04-23 DIAGNOSIS — H25011 Cortical age-related cataract, right eye: Secondary | ICD-10-CM | POA: Diagnosis not present

## 2014-04-23 DIAGNOSIS — H2511 Age-related nuclear cataract, right eye: Secondary | ICD-10-CM | POA: Diagnosis not present

## 2014-04-23 DIAGNOSIS — H25811 Combined forms of age-related cataract, right eye: Secondary | ICD-10-CM | POA: Diagnosis not present

## 2014-04-23 DIAGNOSIS — H25041 Posterior subcapsular polar age-related cataract, right eye: Secondary | ICD-10-CM | POA: Diagnosis not present

## 2014-07-15 ENCOUNTER — Other Ambulatory Visit: Payer: Self-pay

## 2014-07-15 DIAGNOSIS — Z1231 Encounter for screening mammogram for malignant neoplasm of breast: Secondary | ICD-10-CM

## 2014-08-19 ENCOUNTER — Ambulatory Visit: Payer: Medicare Other

## 2014-08-23 DIAGNOSIS — I10 Essential (primary) hypertension: Secondary | ICD-10-CM | POA: Diagnosis not present

## 2014-08-23 DIAGNOSIS — B009 Herpesviral infection, unspecified: Secondary | ICD-10-CM | POA: Diagnosis not present

## 2014-08-23 DIAGNOSIS — Z6824 Body mass index (BMI) 24.0-24.9, adult: Secondary | ICD-10-CM | POA: Diagnosis not present

## 2014-08-23 DIAGNOSIS — H6690 Otitis media, unspecified, unspecified ear: Secondary | ICD-10-CM | POA: Diagnosis not present

## 2014-08-23 DIAGNOSIS — J309 Allergic rhinitis, unspecified: Secondary | ICD-10-CM | POA: Diagnosis not present

## 2014-08-28 ENCOUNTER — Ambulatory Visit: Payer: Medicare Other

## 2014-10-14 DIAGNOSIS — H5310 Unspecified subjective visual disturbances: Secondary | ICD-10-CM | POA: Diagnosis not present

## 2014-10-14 DIAGNOSIS — H43811 Vitreous degeneration, right eye: Secondary | ICD-10-CM | POA: Diagnosis not present

## 2014-10-14 DIAGNOSIS — H401232 Low-tension glaucoma, bilateral, moderate stage: Secondary | ICD-10-CM | POA: Diagnosis not present

## 2014-10-14 DIAGNOSIS — H04123 Dry eye syndrome of bilateral lacrimal glands: Secondary | ICD-10-CM | POA: Diagnosis not present

## 2014-12-30 ENCOUNTER — Ambulatory Visit
Admission: RE | Admit: 2014-12-30 | Discharge: 2014-12-30 | Disposition: A | Discharge status: Home / Self Care | Payer: Medicare Other | Source: Ambulatory Visit

## 2014-12-30 DIAGNOSIS — Z1231 Encounter for screening mammogram for malignant neoplasm of breast: Secondary | ICD-10-CM | POA: Diagnosis not present

## 2015-01-04 DIAGNOSIS — Z23 Encounter for immunization: Secondary | ICD-10-CM | POA: Diagnosis not present

## 2015-03-20 DIAGNOSIS — R8299 Other abnormal findings in urine: Secondary | ICD-10-CM | POA: Diagnosis not present

## 2015-03-20 DIAGNOSIS — N390 Urinary tract infection, site not specified: Secondary | ICD-10-CM | POA: Diagnosis not present

## 2015-03-20 DIAGNOSIS — R946 Abnormal results of thyroid function studies: Secondary | ICD-10-CM | POA: Diagnosis not present

## 2015-03-20 DIAGNOSIS — N179 Acute kidney failure, unspecified: Secondary | ICD-10-CM | POA: Diagnosis not present

## 2015-03-20 DIAGNOSIS — E784 Other hyperlipidemia: Secondary | ICD-10-CM | POA: Diagnosis not present

## 2015-03-20 DIAGNOSIS — E559 Vitamin D deficiency, unspecified: Secondary | ICD-10-CM | POA: Diagnosis not present

## 2015-03-24 DIAGNOSIS — H10413 Chronic giant papillary conjunctivitis, bilateral: Secondary | ICD-10-CM | POA: Diagnosis not present

## 2015-03-24 DIAGNOSIS — H04123 Dry eye syndrome of bilateral lacrimal glands: Secondary | ICD-10-CM | POA: Diagnosis not present

## 2015-03-24 DIAGNOSIS — H401213 Low-tension glaucoma, right eye, severe stage: Secondary | ICD-10-CM | POA: Diagnosis not present

## 2015-03-24 DIAGNOSIS — H401223 Low-tension glaucoma, left eye, severe stage: Secondary | ICD-10-CM | POA: Diagnosis not present

## 2015-03-27 DIAGNOSIS — R946 Abnormal results of thyroid function studies: Secondary | ICD-10-CM | POA: Diagnosis not present

## 2015-03-27 DIAGNOSIS — Z6824 Body mass index (BMI) 24.0-24.9, adult: Secondary | ICD-10-CM | POA: Diagnosis not present

## 2015-03-27 DIAGNOSIS — Z0000 Encounter for general adult medical examination without abnormal findings: Secondary | ICD-10-CM | POA: Diagnosis not present

## 2015-03-27 DIAGNOSIS — J45909 Unspecified asthma, uncomplicated: Secondary | ICD-10-CM | POA: Diagnosis not present

## 2015-03-27 DIAGNOSIS — Z1212 Encounter for screening for malignant neoplasm of rectum: Secondary | ICD-10-CM | POA: Diagnosis not present

## 2015-03-27 DIAGNOSIS — Z1231 Encounter for screening mammogram for malignant neoplasm of breast: Secondary | ICD-10-CM | POA: Diagnosis not present

## 2015-03-27 DIAGNOSIS — I350 Nonrheumatic aortic (valve) stenosis: Secondary | ICD-10-CM | POA: Diagnosis not present

## 2015-03-27 DIAGNOSIS — E876 Hypokalemia: Secondary | ICD-10-CM | POA: Diagnosis not present

## 2015-03-27 DIAGNOSIS — L509 Urticaria, unspecified: Secondary | ICD-10-CM | POA: Diagnosis not present

## 2015-03-27 DIAGNOSIS — B009 Herpesviral infection, unspecified: Secondary | ICD-10-CM | POA: Diagnosis not present

## 2015-03-27 DIAGNOSIS — E784 Other hyperlipidemia: Secondary | ICD-10-CM | POA: Diagnosis not present

## 2015-03-27 DIAGNOSIS — Z1389 Encounter for screening for other disorder: Secondary | ICD-10-CM | POA: Diagnosis not present

## 2015-03-27 DIAGNOSIS — E559 Vitamin D deficiency, unspecified: Secondary | ICD-10-CM | POA: Diagnosis not present

## 2015-04-28 DIAGNOSIS — H16223 Keratoconjunctivitis sicca, not specified as Sjogren's, bilateral: Secondary | ICD-10-CM | POA: Diagnosis not present

## 2015-04-28 DIAGNOSIS — H401222 Low-tension glaucoma, left eye, moderate stage: Secondary | ICD-10-CM | POA: Diagnosis not present

## 2015-04-28 DIAGNOSIS — H401213 Low-tension glaucoma, right eye, severe stage: Secondary | ICD-10-CM | POA: Diagnosis not present

## 2015-05-02 DIAGNOSIS — L245 Irritant contact dermatitis due to other chemical products: Secondary | ICD-10-CM | POA: Diagnosis not present

## 2015-05-05 ENCOUNTER — Other Ambulatory Visit (HOSPITAL_COMMUNITY): Payer: Self-pay | Admitting: Internal Medicine

## 2015-05-05 DIAGNOSIS — I350 Nonrheumatic aortic (valve) stenosis: Secondary | ICD-10-CM

## 2015-05-06 ENCOUNTER — Telehealth: Payer: Self-pay

## 2015-05-06 NOTE — Telephone Encounter (Signed)
Received a call from [place] with Dr.Navia's office.She stated patient needs appointment with Dr.Jordan 1 to 2 weeks after echo.Appointment scheduled with Dr.Jordan 06/03/15 at 8:15 am.

## 2015-05-15 ENCOUNTER — Ambulatory Visit (HOSPITAL_COMMUNITY): Payer: Medicare Other | Attending: Cardiovascular Disease

## 2015-05-15 ENCOUNTER — Other Ambulatory Visit: Payer: Self-pay

## 2015-05-15 DIAGNOSIS — I517 Cardiomegaly: Secondary | ICD-10-CM | POA: Diagnosis not present

## 2015-05-15 DIAGNOSIS — I350 Nonrheumatic aortic (valve) stenosis: Secondary | ICD-10-CM | POA: Diagnosis not present

## 2015-05-15 DIAGNOSIS — I340 Nonrheumatic mitral (valve) insufficiency: Secondary | ICD-10-CM | POA: Diagnosis not present

## 2015-05-26 ENCOUNTER — Telehealth: Payer: Self-pay | Admitting: Cardiology

## 2015-05-26 NOTE — Telephone Encounter (Signed)
Received records from [facility] for appointment on 06/03/15 with Dr Martinique.  Records given to [facility] (medical records) for Dr Doug Sou schedule on 06/03/15. lp

## 2015-06-03 ENCOUNTER — Ambulatory Visit: Payer: Medicare Other | Admitting: Cardiology

## 2015-06-04 ENCOUNTER — Encounter: Payer: Self-pay | Admitting: *Deleted

## 2015-07-16 DIAGNOSIS — D2271 Melanocytic nevi of right lower limb, including hip: Secondary | ICD-10-CM | POA: Diagnosis not present

## 2015-07-16 DIAGNOSIS — L814 Other melanin hyperpigmentation: Secondary | ICD-10-CM | POA: Diagnosis not present

## 2015-07-16 DIAGNOSIS — D2272 Melanocytic nevi of left lower limb, including hip: Secondary | ICD-10-CM | POA: Diagnosis not present

## 2015-07-16 DIAGNOSIS — D2262 Melanocytic nevi of left upper limb, including shoulder: Secondary | ICD-10-CM | POA: Diagnosis not present

## 2015-07-16 DIAGNOSIS — I8392 Asymptomatic varicose veins of left lower extremity: Secondary | ICD-10-CM | POA: Diagnosis not present

## 2015-07-16 DIAGNOSIS — L821 Other seborrheic keratosis: Secondary | ICD-10-CM | POA: Diagnosis not present

## 2015-07-16 DIAGNOSIS — D1801 Hemangioma of skin and subcutaneous tissue: Secondary | ICD-10-CM | POA: Diagnosis not present

## 2015-07-16 DIAGNOSIS — D225 Melanocytic nevi of trunk: Secondary | ICD-10-CM | POA: Diagnosis not present

## 2015-07-16 DIAGNOSIS — B351 Tinea unguium: Secondary | ICD-10-CM | POA: Diagnosis not present

## 2015-07-29 ENCOUNTER — Telehealth: Payer: Self-pay

## 2015-07-29 NOTE — Telephone Encounter (Signed)
NOTES FAXED TO [facility] °

## 2015-07-30 ENCOUNTER — Telehealth: Payer: Self-pay | Admitting: Cardiology

## 2015-07-30 NOTE — Telephone Encounter (Signed)
Received records from [facility] for appointment on 08/04/15 with Dr Martinique.  Records given to [facility] (medical records) for Dr Doug Sou schedule on 08/04/15. lp

## 2015-08-01 ENCOUNTER — Inpatient Hospital Stay (HOSPITAL_COMMUNITY)
Admission: EM | Admit: 2015-08-01 | Discharge: 2015-08-11 | DRG: 064 | Disposition: A | Discharge status: Rehabilitation Facility | Payer: Medicare Other | Attending: Neurology | Admitting: Neurology

## 2015-08-01 ENCOUNTER — Encounter (HOSPITAL_COMMUNITY): Payer: Self-pay

## 2015-08-01 ENCOUNTER — Emergency Department (HOSPITAL_COMMUNITY): Payer: Medicare Other

## 2015-08-01 ENCOUNTER — Inpatient Hospital Stay (HOSPITAL_COMMUNITY): Payer: Medicare Other

## 2015-08-01 DIAGNOSIS — I639 Cerebral infarction, unspecified: Secondary | ICD-10-CM | POA: Diagnosis not present

## 2015-08-01 DIAGNOSIS — Z0000 Encounter for general adult medical examination without abnormal findings: Secondary | ICD-10-CM

## 2015-08-01 DIAGNOSIS — I6789 Other cerebrovascular disease: Secondary | ICD-10-CM | POA: Diagnosis not present

## 2015-08-01 DIAGNOSIS — G8111 Spastic hemiplegia affecting right dominant side: Secondary | ICD-10-CM | POA: Diagnosis not present

## 2015-08-01 DIAGNOSIS — E876 Hypokalemia: Secondary | ICD-10-CM | POA: Diagnosis not present

## 2015-08-01 DIAGNOSIS — I69391 Dysphagia following cerebral infarction: Secondary | ICD-10-CM

## 2015-08-01 DIAGNOSIS — R32 Unspecified urinary incontinence: Secondary | ICD-10-CM | POA: Diagnosis present

## 2015-08-01 DIAGNOSIS — G935 Compression of brain: Secondary | ICD-10-CM | POA: Diagnosis not present

## 2015-08-01 DIAGNOSIS — I610 Nontraumatic intracerebral hemorrhage in hemisphere, subcortical: Secondary | ICD-10-CM | POA: Diagnosis not present

## 2015-08-01 DIAGNOSIS — E785 Hyperlipidemia, unspecified: Secondary | ICD-10-CM | POA: Diagnosis present

## 2015-08-01 DIAGNOSIS — R29725 NIHSS score 25: Secondary | ICD-10-CM | POA: Diagnosis present

## 2015-08-01 DIAGNOSIS — I69291 Dysphagia following other nontraumatic intracranial hemorrhage: Secondary | ICD-10-CM | POA: Diagnosis not present

## 2015-08-01 DIAGNOSIS — I613 Nontraumatic intracerebral hemorrhage in brain stem: Secondary | ICD-10-CM | POA: Diagnosis not present

## 2015-08-01 DIAGNOSIS — N390 Urinary tract infection, site not specified: Secondary | ICD-10-CM | POA: Diagnosis present

## 2015-08-01 DIAGNOSIS — E441 Mild protein-calorie malnutrition: Secondary | ICD-10-CM | POA: Diagnosis present

## 2015-08-01 DIAGNOSIS — F329 Major depressive disorder, single episode, unspecified: Secondary | ICD-10-CM | POA: Diagnosis not present

## 2015-08-01 DIAGNOSIS — R1310 Dysphagia, unspecified: Secondary | ICD-10-CM | POA: Diagnosis present

## 2015-08-01 DIAGNOSIS — I69220 Aphasia following other nontraumatic intracranial hemorrhage: Secondary | ICD-10-CM | POA: Diagnosis not present

## 2015-08-01 DIAGNOSIS — G8191 Hemiplegia, unspecified affecting right dominant side: Secondary | ICD-10-CM | POA: Diagnosis present

## 2015-08-01 DIAGNOSIS — D72829 Elevated white blood cell count, unspecified: Secondary | ICD-10-CM

## 2015-08-01 DIAGNOSIS — Z880 Allergy status to penicillin: Secondary | ICD-10-CM | POA: Diagnosis not present

## 2015-08-01 DIAGNOSIS — I161 Hypertensive emergency: Secondary | ICD-10-CM | POA: Diagnosis present

## 2015-08-01 DIAGNOSIS — G936 Cerebral edema: Secondary | ICD-10-CM | POA: Diagnosis not present

## 2015-08-01 DIAGNOSIS — Z7289 Other problems related to lifestyle: Secondary | ICD-10-CM

## 2015-08-01 DIAGNOSIS — R4189 Other symptoms and signs involving cognitive functions and awareness: Secondary | ICD-10-CM

## 2015-08-01 DIAGNOSIS — I10 Essential (primary) hypertension: Secondary | ICD-10-CM | POA: Diagnosis present

## 2015-08-01 DIAGNOSIS — I69320 Aphasia following cerebral infarction: Secondary | ICD-10-CM | POA: Diagnosis not present

## 2015-08-01 DIAGNOSIS — R4701 Aphasia: Secondary | ICD-10-CM | POA: Diagnosis present

## 2015-08-01 DIAGNOSIS — R4182 Altered mental status, unspecified: Secondary | ICD-10-CM | POA: Diagnosis not present

## 2015-08-01 DIAGNOSIS — Z6824 Body mass index (BMI) 24.0-24.9, adult: Secondary | ICD-10-CM

## 2015-08-01 DIAGNOSIS — I69319 Unspecified symptoms and signs involving cognitive functions following cerebral infarction: Secondary | ICD-10-CM | POA: Diagnosis not present

## 2015-08-01 DIAGNOSIS — I619 Nontraumatic intracerebral hemorrhage, unspecified: Secondary | ICD-10-CM | POA: Insufficient documentation

## 2015-08-01 DIAGNOSIS — Z4682 Encounter for fitting and adjustment of non-vascular catheter: Secondary | ICD-10-CM | POA: Diagnosis not present

## 2015-08-01 DIAGNOSIS — T85598A Other mechanical complication of other gastrointestinal prosthetic devices, implants and grafts, initial encounter: Secondary | ICD-10-CM

## 2015-08-01 DIAGNOSIS — Z8249 Family history of ischemic heart disease and other diseases of the circulatory system: Secondary | ICD-10-CM

## 2015-08-01 DIAGNOSIS — R58 Hemorrhage, not elsewhere classified: Secondary | ICD-10-CM

## 2015-08-01 DIAGNOSIS — I69251 Hemiplegia and hemiparesis following other nontraumatic intracranial hemorrhage affecting right dominant side: Secondary | ICD-10-CM | POA: Diagnosis not present

## 2015-08-01 DIAGNOSIS — J9811 Atelectasis: Secondary | ICD-10-CM | POA: Diagnosis not present

## 2015-08-01 DIAGNOSIS — I69359 Hemiplegia and hemiparesis following cerebral infarction affecting unspecified side: Secondary | ICD-10-CM | POA: Diagnosis not present

## 2015-08-01 DIAGNOSIS — Z781 Physical restraint status: Secondary | ICD-10-CM | POA: Diagnosis not present

## 2015-08-01 DIAGNOSIS — E46 Unspecified protein-calorie malnutrition: Secondary | ICD-10-CM | POA: Diagnosis not present

## 2015-08-01 DIAGNOSIS — R531 Weakness: Secondary | ICD-10-CM | POA: Diagnosis not present

## 2015-08-01 DIAGNOSIS — R29810 Facial weakness: Secondary | ICD-10-CM | POA: Diagnosis not present

## 2015-08-01 DIAGNOSIS — I69321 Dysphasia following cerebral infarction: Secondary | ICD-10-CM | POA: Diagnosis not present

## 2015-08-01 DIAGNOSIS — R5383 Other fatigue: Secondary | ICD-10-CM | POA: Diagnosis not present

## 2015-08-01 DIAGNOSIS — G8190 Hemiplegia, unspecified affecting unspecified side: Secondary | ICD-10-CM | POA: Diagnosis not present

## 2015-08-01 LAB — DIFFERENTIAL
BASOS ABS: 0.1 10*3/uL (ref 0.0–0.1)
Basophils Relative: 1 %
Eosinophils Absolute: 1.1 10*3/uL — ABNORMAL HIGH (ref 0.0–0.7)
Eosinophils Relative: 12 %
LYMPHS ABS: 3.2 10*3/uL (ref 0.7–4.0)
LYMPHS PCT: 33 %
Monocytes Absolute: 0.7 10*3/uL (ref 0.1–1.0)
Monocytes Relative: 7 %
NEUTROS PCT: 47 %
Neutro Abs: 4.5 10*3/uL (ref 1.7–7.7)

## 2015-08-01 LAB — CBC
HCT: 37.3 % (ref 36.0–46.0)
HEMOGLOBIN: 12.6 g/dL (ref 12.0–15.0)
MCH: 30.4 pg (ref 26.0–34.0)
MCHC: 33.8 g/dL (ref 30.0–36.0)
MCV: 90.1 fL (ref 78.0–100.0)
PLATELETS: 290 10*3/uL (ref 150–400)
RBC: 4.14 MIL/uL (ref 3.87–5.11)
RDW: 13.9 % (ref 11.5–15.5)
WBC: 9.6 10*3/uL (ref 4.0–10.5)

## 2015-08-01 LAB — COMPREHENSIVE METABOLIC PANEL
ALT: 17 U/L (ref 14–54)
ANION GAP: 14 (ref 5–15)
AST: 27 U/L (ref 15–41)
Albumin: 4.4 g/dL (ref 3.5–5.0)
Alkaline Phosphatase: 63 U/L (ref 38–126)
BUN: 15 mg/dL (ref 6–20)
CHLORIDE: 106 mmol/L (ref 101–111)
CO2: 21 mmol/L — ABNORMAL LOW (ref 22–32)
Calcium: 9.3 mg/dL (ref 8.9–10.3)
Creatinine, Ser: 0.82 mg/dL (ref 0.44–1.00)
GFR calc Af Amer: >60 mL/min (ref >60)
GFR calc non Af Amer: >60 mL/min (ref >60)
GLUCOSE: 104 mg/dL — AB (ref 65–99)
POTASSIUM: 3.7 mmol/L (ref 3.5–5.1)
SODIUM: 141 mmol/L (ref 135–145)
Total Bilirubin: 1.2 mg/dL (ref 0.3–1.2)
Total Protein: 6.9 g/dL (ref 6.5–8.1)

## 2015-08-01 LAB — ETHANOL: Alcohol, Ethyl (B): <5 mg/dL (ref <5)

## 2015-08-01 LAB — PROTIME-INR
INR: 1.08 (ref 0.00–1.49)
PROTHROMBIN TIME: 14.2 s (ref 11.6–15.2)

## 2015-08-01 LAB — I-STAT TROPONIN, ED: Troponin i, poc: 0 ng/mL (ref 0.00–0.08)

## 2015-08-01 LAB — I-STAT CHEM 8, ED
BUN: 18 mg/dL (ref 6–20)
CHLORIDE: 106 mmol/L (ref 101–111)
CREATININE: 0.8 mg/dL (ref 0.44–1.00)
Calcium, Ion: 1.07 mmol/L — ABNORMAL LOW (ref 1.13–1.30)
Glucose, Bld: 97 mg/dL (ref 65–99)
HEMATOCRIT: 41 % (ref 36.0–46.0)
Hemoglobin: 13.9 g/dL (ref 12.0–15.0)
POTASSIUM: 3.5 mmol/L (ref 3.5–5.1)
Sodium: 140 mmol/L (ref 135–145)
TCO2: 21 mmol/L (ref 0–100)

## 2015-08-01 LAB — MRSA PCR SCREENING: MRSA BY PCR: NEGATIVE

## 2015-08-01 LAB — APTT: APTT: 32 s (ref 24–37)

## 2015-08-01 MED ORDER — SODIUM CHLORIDE 0.9 % IV SOLN
INTRAVENOUS | Status: DC
  Administered 2015-08-01 – 2015-08-02 (×2): via INTRAVENOUS

## 2015-08-01 MED ORDER — STROKE: EARLY STAGES OF RECOVERY BOOK
Freq: Once | Status: AC
  Administered 2015-08-01: 22:00:00
  Filled 2015-08-01 (×2): qty 1

## 2015-08-01 MED ORDER — ACETAMINOPHEN 325 MG PO TABS: 650.0000 mg | ORAL_TABLET | ORAL | Status: DC | PRN

## 2015-08-01 MED ORDER — PANTOPRAZOLE SODIUM 40 MG IV SOLR
40.0000 mg | Freq: Every day | INTRAVENOUS | Status: DC
  Administered 2015-08-01 – 2015-08-05 (×5): 40 mg via INTRAVENOUS
  Filled 2015-08-01 (×5): qty 40

## 2015-08-01 MED ORDER — LABETALOL HCL 5 MG/ML IV SOLN
INTRAVENOUS | Status: AC
  Filled 2015-08-01: qty 4

## 2015-08-01 MED ORDER — LABETALOL HCL 5 MG/ML IV SOLN
10.0000 mg | INTRAVENOUS | Status: DC | PRN
  Administered 2015-08-01 – 2015-08-03 (×3): 20 mg via INTRAVENOUS
  Administered 2015-08-05 (×2): 10 mg via INTRAVENOUS
  Filled 2015-08-01 (×5): qty 4

## 2015-08-01 MED ORDER — ACETAMINOPHEN 650 MG RE SUPP: 650.0000 mg | RECTAL | Status: DC | PRN

## 2015-08-01 NOTE — ED Notes (Signed)
Pt arrived via [facility] c/o stroke like symptoms, aphasic, right sided weakness, right sided facial droop.

## 2015-08-01 NOTE — Consult Note (Signed)
Admission H&P    Chief Complaint: Acute onset of aphasia and right hemiplegia.  HPI: [name] is an 75 y.o. female with a history of hypertension, brought to the emergency room in [place] stroke status following acute onset of right-sided weakness and speech difficulty. She was last known well at 5:20 PM today. She has no previous history of stroke or TIA. CT scan of her head with a large left basal ganglia hemorrhage with no extension into left lateral ventricle. Blood pressure was 168/89. Patient was unable to speak and was neglecting her right side, with a gaze to the left. She had no voluntary movement of right extremities. NIH stroke score was 25.  LSN: 5:20 PM on 08/01/2015 tPA Given: No: Acute ICH mRankin:  Past Medical History  Diagnosis Date  . Bronchitis, not specified as acute or chronic   . Other chronic sinusitis   . Other chronic allergic conjunctivitis   . Allergic rhinitis, cause unspecified   . Eosinophilic pneumonia ([place])     Hosp 3/16-30/12  . Acute renal insufficiency     due to amphotericin 2012  . Irritable bowel   . Hypertension   . Diverticulosis     Past Surgical History  Procedure Laterality Date  . Cesarean section      x 4  . Vesicovaginal fistula closure w/ tah    . Breast lumpectomy      Right-benign  . Tonsillectomy    . Appendectomy    . Bronchoscopy      Video bronch w/ TBBX 2012    Family History  Problem Relation Age of Onset  . Aortic aneurysm Father   . Heart failure Mother    Social History:  reports that she has never smoked. She has never used smokeless tobacco. She reports that she drinks alcohol. Her drug history is not on file.  Allergies:  Allergies  Allergen Reactions  . Penicillins Hives and Rash    Medications: Patient's preadmission medications were reviewed by me.  ROS: History obtained from spouse  General ROS: negative for - chills, fatigue, fever, night sweats, weight gain or weight loss Psychological  ROS: negative for - behavioral disorder, hallucinations, memory difficulties, mood swings or suicidal ideation Ophthalmic ROS: negative for - blurry vision, double vision, eye pain or loss of vision ENT ROS: negative for - epistaxis, nasal discharge, oral lesions, sore throat, tinnitus or vertigo Allergy and Immunology ROS: negative for - hives or itchy/watery eyes Hematological and Lymphatic ROS: negative for - bleeding problems, bruising or swollen lymph nodes Endocrine ROS: negative for - galactorrhea, hair pattern changes, polydipsia/polyuria or temperature intolerance Respiratory ROS: negative for - cough, hemoptysis, shortness of breath or wheezing Cardiovascular ROS: negative for - chest pain, dyspnea on exertion, edema or irregular heartbeat Gastrointestinal ROS: negative for - abdominal pain, diarrhea, hematemesis, nausea/vomiting or stool incontinence Genito-Urinary ROS: negative for - dysuria, hematuria, incontinence or urinary frequency/urgency Musculoskeletal ROS: negative for - joint swelling or muscular weakness Neurological ROS: as noted in HPI Dermatological ROS: negative for rash and skin lesion changes  Physical Examination: Blood pressure 168/89, pulse 85, resp. rate 17, SpO2 98 %.  HEENT-  Normocephalic, no lesions, without obvious abnormality.  Normal external eye and conjunctiva.  Normal TM's bilaterally.  Normal auditory canals and external ears. Normal external nose, mucus membranes and septum.  Normal pharynx. Neck supple with no masses, nodes, nodules or enlargement. Cardiovascular - regular rate and rhythm and systolic murmur: early systolic 3/6, crescendo throughout the precordium  Lungs - chest clear, no wheezing, rales, normal symmetric air entry Abdomen - soft, non-tender; bowel sounds normal; no masses,  no organomegaly Extremities - no joint deformities, effusion, or inflammation and no edema  Neurologic Examination: Mental Status: Alert, severe expressive  aphasia. Able to follow commands with use of right extremities. Cranial Nerves: II-right homonymous hemianopsia. III/IV/VI-Pupils were equal and reacted normally to light. Extraocular movements were full and conjugate.    VII-right lower facial weakness facial weakness. VIII-normal. X-no speech output. Motor: Right hemiplegia with moderate spasticity; normal strength and tone in the left extremities. Sensory: Neglect of right side as well as no response to noxious stimuli right extremities. Deep Tendon Reflexes: 2+, asymmetric with increased responses elicited from right extremities. Plantars: Extensor on the right and flexor on the left Carotid auscultation: Bilateral carotid bruits, likely radiated systolic murmur to carotids  Results for orders placed or performed during the hospital encounter of 08/01/15 (from the past 48 hour(s))  Protime-INR     Status: None   Collection Time: 08/01/15  8:01 PM  Result Value Ref Range   Prothrombin Time 14.2 11.6 - 15.2 seconds   INR 1.08 0.00 - 1.49  APTT     Status: None   Collection Time: 08/01/15  8:01 PM  Result Value Ref Range   aPTT 32 24 - 37 seconds  CBC     Status: None   Collection Time: 08/01/15  8:01 PM  Result Value Ref Range   WBC 9.6 4.0 - 10.5 K/uL   RBC 4.14 3.87 - 5.11 MIL/uL   Hemoglobin 12.6 12.0 - 15.0 g/dL   HCT 37.3 36.0 - 46.0 %   MCV 90.1 78.0 - 100.0 fL   MCH 30.4 26.0 - 34.0 pg   MCHC 33.8 30.0 - 36.0 g/dL   RDW 13.9 11.5 - 15.5 %   Platelets 290 150 - 400 K/uL  Differential     Status: Abnormal   Collection Time: 08/01/15  8:01 PM  Result Value Ref Range   Neutrophils Relative % 47 %   Neutro Abs 4.5 1.7 - 7.7 K/uL   Lymphocytes Relative 33 %   Lymphs Abs 3.2 0.7 - 4.0 K/uL   Monocytes Relative 7 %   Monocytes Absolute 0.7 0.1 - 1.0 K/uL   Eosinophils Relative 12 %   Eosinophils Absolute 1.1 (H) 0.0 - 0.7 K/uL   Basophils Relative 1 %   Basophils Absolute 0.1 0.0 - 0.1 K/uL  I-stat troponin, ED (not  at [facility], [facility])     Status: None   Collection Time: 08/01/15  8:11 PM  Result Value Ref Range   Troponin i, poc 0.00 0.00 - 0.08 ng/mL   Comment 3            Comment: Due to the release kinetics of cTnI, a negative result within the first hours of the onset of symptoms does not rule out myocardial infarction with certainty. If myocardial infarction is still suspected, repeat the test at appropriate intervals.   I-Stat Chem 8, ED  (not at [facility], [facility])     Status: Abnormal   Collection Time: 08/01/15  8:13 PM  Result Value Ref Range   Sodium 140 135 - 145 mmol/L   Potassium 3.5 3.5 - 5.1 mmol/L   Chloride 106 101 - 111 mmol/L   BUN 18 6 - 20 mg/dL   Creatinine, Ser 0.80 0.44 - 1.00 mg/dL   Glucose, Bld 97 65 - 99 mg/dL   Calcium, Ion 1.07 (  L) 1.13 - 1.30 mmol/L   TCO2 21 0 - 100 mmol/L   Hemoglobin 13.9 12.0 - 15.0 g/dL   HCT 41.0 36.0 - 46.0 %   Ct Head Wo Contrast  08/01/2015  CLINICAL DATA:  Right-sided weakness and facial droop. EXAM: CT HEAD WITHOUT CONTRAST TECHNIQUE: Contiguous axial images were obtained from the base of the skull through the vertex without intravenous contrast. COMPARISON:  Paranasal sinus CT dated 09/07/2004. FINDINGS: Left basal ganglia hemorrhage with some surrounding low density. The hemorrhage measures 3.9 x 2.8 cm on image 16. No midline shift. There is some effacement of the sulci on the left. There are some streak artifacts due to patient motion with no definite subarachnoid hemorrhage identified. No intraventricular hemorrhage. The ventricles are normal in size and position. Small amount of patchy white matter low density in both cerebral hemispheres. There are motion artifacts on multiple images with bilateral ethmoid and frontal sinus mucosal thickening, left maxillary sinus mucosal thickening and right maxillary sinus air-fluid level. The visualized bones are grossly unremarkable. IMPRESSION: 1. Left basal ganglia hemorrhagic infarct. 2. Mild chronic small  vessel white matter ischemic changes in both cerebral hemispheres. 3. Chronic bilateral frontal, bilateral ethmoid and left maxillary sinusitis. 4. Acute right maxillary sinusitis. These results were called by telephone at the time of interpretation on 08/01/2015 at 8:14 pm to Dr. Nicole Kindred, who verbally acknowledged these results. Electronically Signed   By: Claudie Revering M.D.   On: 08/01/2015 20:17    Assessment: 75 y.o. female with a history of hypertension presenting with an acute large left basal ganglia hemorrhage with expressive aphasia, right side neglect and right hemiplegia.  Stroke Risk Factors - hypertension  Plan: 1. Admission to the neuro intensive care unit for management of acute intracranial hemorrhage 2. MRI, MRA  of the brain without contrast 3. PT consult, OT consult, Speech consult 4. Echocardiogram 5. Carotid dopplers 6. Prophylactic therapy-None 7. Risk factor modification 8. HgbA1c, fasting lipid panel  This patient is critically ill and at significant risk of neurological worsening or death, and care requires constant monitoring of vital signs, hemodynamics,respiratory and cardiac monitoring, neurological assessment, discussion with family, other specialists and medical decision making of high complexity. Total critical care time was 55 minutes.  C.R. Nicole Kindred, MD [facility] [phone] 08/01/2015, 8:30 PM

## 2015-08-01 NOTE — Code Documentation (Signed)
Code stroke called at 75 for this 75 y/o white female pt who was last seen well at 1920 hrs. Pt was in the kitchen when her husband stepped out for a moment.  He heard her call out and returned to the kitchen to find her on the floor with  Slurred speech and right side weakness.  Husband states pt did not fall but lowered herself to the floor.  EMS was called by husband and  Upon their arrival pt was nonverbal with RUE and RLE weakness.  CBG was 91.  On arrival to [facility], pt was cleared for CT scan by Dr Durene Cal at [phone] with arrival at [facility] scan at 1958.  CT scan results of a Left basal ganglia hemorrhagic infarct was called to Dr Nicole Kindred at 2014 hrs.  Pt scored a 25 on the NIHSS   With noted right side neglect and gaze deviation to the left.. BP 169/89 .  Code stroke was cancelled by Dr Idelle Leech at 2027 hrs.  Pt to be admitted to [facility].

## 2015-08-02 ENCOUNTER — Inpatient Hospital Stay (HOSPITAL_COMMUNITY): Payer: Medicare Other

## 2015-08-02 LAB — COMPREHENSIVE METABOLIC PANEL
ALK PHOS: 58 U/L (ref 38–126)
ALT: 26 U/L (ref 14–54)
ANION GAP: 12 (ref 5–15)
AST: 89 U/L — ABNORMAL HIGH (ref 15–41)
Albumin: 3.9 g/dL (ref 3.5–5.0)
BILIRUBIN TOTAL: 1.6 mg/dL — AB (ref 0.3–1.2)
BUN: 11 mg/dL (ref 6–20)
CALCIUM: 9 mg/dL (ref 8.9–10.3)
CO2: 22 mmol/L (ref 22–32)
Chloride: 107 mmol/L (ref 101–111)
Creatinine, Ser: 0.77 mg/dL (ref 0.44–1.00)
Glucose, Bld: 103 mg/dL — ABNORMAL HIGH (ref 65–99)
Potassium: 3.6 mmol/L (ref 3.5–5.1)
SODIUM: 141 mmol/L (ref 135–145)
TOTAL PROTEIN: 6.3 g/dL — AB (ref 6.5–8.1)

## 2015-08-02 LAB — CBC
HCT: 36.7 % (ref 36.0–46.0)
HEMOGLOBIN: 12.3 g/dL (ref 12.0–15.0)
MCH: 30.1 pg (ref 26.0–34.0)
MCHC: 33.5 g/dL (ref 30.0–36.0)
MCV: 89.7 fL (ref 78.0–100.0)
Platelets: 262 10*3/uL (ref 150–400)
RBC: 4.09 MIL/uL (ref 3.87–5.11)
RDW: 14 % (ref 11.5–15.5)
WBC: 10.2 10*3/uL (ref 4.0–10.5)

## 2015-08-02 LAB — GLUCOSE, CAPILLARY: Glucose-Capillary: 137 mg/dL — ABNORMAL HIGH (ref 65–99)

## 2015-08-02 MED ORDER — PRO-STAT SUGAR FREE PO LIQD
30.0000 mL | Freq: Every day | ORAL | Status: DC
  Administered 2015-08-02: 30 mL

## 2015-08-02 MED ORDER — VITAL HIGH PROTEIN PO LIQD: 1000.0000 mL | ORAL | Status: DC

## 2015-08-02 MED ORDER — ONDANSETRON HCL 4 MG/2ML IJ SOLN: 4.0000 mg | Freq: Four times a day (QID) | INTRAMUSCULAR | Status: DC | PRN

## 2015-08-02 MED ORDER — CIPROFLOXACIN HCL 500 MG PO TABS
500.0000 mg | ORAL_TABLET | Freq: Two times a day (BID) | ORAL | Status: DC
  Administered 2015-08-02 (×2): 500 mg via ORAL
  Filled 2015-08-02 (×2): qty 1

## 2015-08-02 MED ORDER — HYDRALAZINE HCL 20 MG/ML IJ SOLN
10.0000 mg | INTRAMUSCULAR | Status: DC | PRN
  Administered 2015-08-02 – 2015-08-07 (×10): 10 mg via INTRAVENOUS
  Filled 2015-08-02 (×11): qty 1

## 2015-08-02 MED ORDER — OLOPATADINE HCL 0.1 % OP SOLN
1.0000 [drp] | Freq: Two times a day (BID) | OPHTHALMIC | Status: DC | PRN
  Filled 2015-08-02: qty 5

## 2015-08-02 MED ORDER — POLYVINYL ALCOHOL 1.4 % OP SOLN
1.0000 [drp] | OPHTHALMIC | Status: DC | PRN
  Filled 2015-08-02: qty 15

## 2015-08-02 MED ORDER — LATANOPROST 0.005 % OP SOLN
1.0000 [drp] | Freq: Every day | OPHTHALMIC | Status: DC
  Administered 2015-08-02 – 2015-08-10 (×8): 1 [drp] via OPHTHALMIC
  Filled 2015-08-02 (×2): qty 2.5

## 2015-08-02 MED ORDER — GADOBENATE DIMEGLUMINE 529 MG/ML IV SOLN
10.0000 mL | Freq: Once | INTRAVENOUS | Status: AC | PRN
  Administered 2015-08-02: 10 mL via INTRAVENOUS

## 2015-08-02 MED ORDER — LOSARTAN POTASSIUM 50 MG PO TABS
100.0000 mg | ORAL_TABLET | Freq: Every day | ORAL | Status: DC
  Administered 2015-08-02 – 2015-08-05 (×3): 100 mg via ORAL
  Filled 2015-08-02 (×6): qty 2

## 2015-08-02 MED ORDER — CETYLPYRIDINIUM CHLORIDE 0.05 % MT LIQD
7.0000 mL | Freq: Two times a day (BID) | OROMUCOSAL | Status: DC
  Administered 2015-08-02 – 2015-08-11 (×14): 7 mL via OROMUCOSAL

## 2015-08-02 MED ORDER — VITAL 1.5 CAL PO LIQD
1000.0000 mL | ORAL | Status: DC
  Administered 2015-08-02: 1000 mL
  Filled 2015-08-02 (×7): qty 1000

## 2015-08-02 NOTE — Progress Notes (Signed)
PT Cancellation Note  Patient Details Name: [name] MRN: [id number] DOB: May 03, 1940   Cancelled Treatment:    Reason Eval/Treat Not Completed: Medical issues which prohibited therapy;Patient not medically ready.  Patient remains on bedrest per orders.   MD*:  Please write activity orders when appropriate for patient.  PT will initiate evaluation at that time.  Thank you.   Despina Pole 08/02/2015, 10:28 AM Carita Pian. Sanjuana Kava, [place] Pager [phone]

## 2015-08-02 NOTE — Progress Notes (Signed)
STROKE TEAM PROGRESS NOTE   HISTORY OF PRESENT ILLNESS [name] is an 75 y.o. female with a history of hypertension, brought to the emergency room in [place] stroke status following acute onset of right-sided weakness and speech difficulty. She was last known well at 5:20 PM today. She has no previous history of stroke or TIA. CT scan of her head with a large left basal ganglia hemorrhage with no extension into left lateral ventricle. Blood pressure was 168/89. Patient was unable to speak and was neglecting her right side, with a gaze to the left. She had no voluntary movement of right extremities. NIH stroke score was 25.  LSN: 5:20 PM on 08/01/2015 tPA Given: No: Acute ICH mRankin:   SUBJECTIVE (INTERVAL HISTORY) Dr London Pepper, the patient's husband and the patient's two sons were at the bedside.  I reviewed the scans, diagnosis and prognosis.  Dr. Irven Shelling informed me that the patient typically has one cocktail and 2 glasses of wine every evening.  Overall, they believe the patient is unchanged.  Dr Irven Shelling has agreed to an NGT if needed   OBJECTIVE Temp:  [97.3 F (36.3 C)-98.5 F (36.9 C)] 98.4 F (36.9 C) (04/15 0814) Pulse Rate:  [60-91] 72 (04/15 0900) Cardiac Rhythm:  [-] Normal sinus rhythm (04/14 2115) Resp:  [14-27] 16 (04/15 0900) BP: (102-193)/(67-106) 147/74 mmHg (04/15 0900) SpO2:  [96 %-100 %] 100 % (04/15 0900) Weight:  [63.9 kg (140 lb 14 oz)] 63.9 kg (140 lb 14 oz) (04/14 2301)  CBC:   Recent Labs Lab 08/01/15 2001 08/01/15 2013 08/02/15 0819  WBC 9.6  --  10.2  NEUTROABS 4.5  --   --   HGB 12.6 13.9 12.3  HCT 37.3 41.0 36.7  MCV 90.1  --  89.7  PLT 290  --  99991111    Basic Metabolic Panel:   Recent Labs Lab 08/01/15 2001 08/01/15 2013  NA 141 140  K 3.7 3.5  CL 106 106  CO2 21*  --   GLUCOSE 104* 97  BUN 15 18  CREATININE 0.82 0.80  CALCIUM 9.3  --     Lipid Panel: No results found for: CHOL, TRIG, HDL, CHOLHDL, VLDL, LDLCALC HgbA1c: No  results found for: HGBA1C Urine Drug Screen: No results found for: LABOPIA, COCAINSCRNUR, LABBENZ, AMPHETMU, THCU, LABBARB    IMAGING  Ct Head Wo Contrast  08/01/2015   1. Left basal ganglia hemorrhagic infarct.  2. Mild chronic small vessel white matter ischemic changes in both cerebral hemispheres.  3. Chronic bilateral frontal, bilateral ethmoid and left maxillary sinusitis.  4. Acute right maxillary sinusitis.     Chest Port 1 View 08/01/2015   1. No acute findings.  2. Accentuation of the mediastinal contours may be due to AP technique and low lung volumes. Consider follow-up PA and lateral views of the chest in further evaluation, as clinically indicated.   MRI with and without contrast - pending  Portable abdominal x-ray  08/02/2015 The tip of the feeding tube overlies the distal descending duodenum.  PHYSICAL EXAM General - Well nourished, well developed, in NAD   HEENT:  NCAT, PERRL, nose and throat clear Cardiovascular - Regular rate and rhythm Pulmonary: CTA Abdomen: NT, ND, normal bowel sounds Extremities: No C/C/E  Neurological Exam Mental Status: Lethargic, does not vocalize; does not follow commands Orientation:  Unable to assess for orientation to person, place and time Speech:  Unable to assess; essentially mute   Cranial Nerves:  PERRL; EOMI; visual fields  full, face grossly symmetric, hearing grossly intact; shrug symmetric and tongue midline  Motor Exam:  Tone:  Within normal limits; Strength: plegic on the right  Sensory: Intact to light touch throughout the left side; patient does not respond to noxious stimuli on the left  Coordination:  Unable to assess  Gait: Deferred   ASSESSMENT/PLAN [name] is a 75 y.o. female with history hypertension and hyperlipidemia presenting with right hemiparesis and speech difficulties.  She did not receive IV t-PA due to large left basal ganglia hemorrhage.  Hemorrhagic Stroke:   Non-dominant hemorrhagic infarct probably secondary to hypertension.  Resultant    MRI  pending  MRA  not performed  Carotid Doppler not indicated  2D Echo - performed January 2017  LDL - not performed  HgbA1c - not performed  VTE prophylaxis - SCDs Diet NPO time specified  No antithrombotic prior to admission, now on No antithrombotic secondary to hemorrhage  Ongoing aggressive stroke risk factor management  Therapy recommendations: Pending  Disposition:  Pending  Hypertension  Stable  Cozaar 100 mg daily along with PRN hydralazine and PRN labetalol  Hyperlipidemia  Home meds: Lipitor 20 mg daily - held secondary to hemorrhage  LDL not performed, goal < 70  Continue statin at discharge  Other Stroke Risk Factors  Advanced age  ETOH use   Other Active Problems  UTI - Cipro  NPO - Cortrac feeding tube - dietitian consult  Hospital day # [place] PA-C [facility] Pager [phone] 08/02/2015, 2:45 PM   CRITICAL CARE NEUROLOGY ATTENDING NOTE Patient was seen and examined by me personally. I reviewed notes, independently viewed imaging studies, participated in medical decision making and plan of care. I have made additions or clarifications directly to the above note.  Documentation accurately reflects findings. The laboratory and radiographic studies were personally reviewed by me.  ROS pertinent positives could not be fully documented due to LOC  Assessment and plan completed by me personally and fully documented above. MRI with and without contrast ordered; will also assess for low probability of amyloid Will manage BP with goal SBP<140; start home Cozaar Pulmonary:  No acute issues GI:  If patient does not pass swallow eval; NGT and nutrition consult ID:  Cipro started for positive nitrites and bacteria in the urine  Condition is unchanged  This patient is critically ill and at significant risk of neurological  worsening, death and care requires constant monitoring of vital signs, hemodynamics,respiratory and cardiac monitoring, extensive review of multiple databases, frequent neurological assessment, discussion with family, other specialists and medical decision making of high complexity.  This critical care time does not reflect procedure time, or teaching time or supervisory time of PA/NP/Med Resident etc. but could involve care discussion time.  I spent 30 minutes of Neurocritical Care time in the care of  this patient.  SIGNED BY: Dr. Elissa Hefty       To contact Stroke Continuity provider, please refer to [email or web address] After hours, contact General Neurology

## 2015-08-02 NOTE — Progress Notes (Signed)
Initial Nutrition Assessment  DOCUMENTATION CODES:  Not applicable  INTERVENTION:  Initiate Vital AF 1.5  @ 20 ml/hr via NGT and increase by 10 ml every 4 hours to goal rate of 45 ml/hr.   30 ml Prostat q 24 hrs.    Tube feeding regimen provides 1720 kcal (100% of needs), 88 grams of protein, and 825 ml of H2O.   NUTRITION DIAGNOSIS:  Inadequate oral intake related to inability to eat as evidenced by NPO status.  GOAL:  Patient will meet greater than or equal to 90% of their needs  MONITOR:  Diet advancement, Labs, TF tolerance, I & O's  REASON FOR ASSESSMENT:  Consult Enteral/tube feeding initiation and management  ASSESSMENT:  75 y/o female PMHx HTN, IBS presented to ED w/ acute onset of right sided weakness and speech difficulty. CT of head shows large left basal ganglia hemorrhage.   Husband not at bedside. Pt non-responsive. Cortrak placed today. Tip in descending duodenum.   Propofol: None  Labs reviewed: Slightly elevated Bili, slightly low total pro.   Recent Labs Lab 08/01/15 2001 08/01/15 2013 08/02/15 0819  NA 141 140 141  K 3.7 3.5 3.6  CL 106 106 107  CO2 21*  --  22  BUN 15 18 11   CREATININE 0.82 0.80 0.77  CALCIUM 9.3  --  9.0  GLUCOSE 104* 97 103*   Diet Order:  Diet NPO time specified  Skin:  Reviewed, no issues  Last BM:  Unknown  Height:  Ht Readings from Last 1 Encounters:  08/01/15 5\' 4"  (1.626 m)   Weight:  Wt Readings from Last 1 Encounters:  08/01/15 140 lb 14 oz (63.9 kg)   Wt Readings from Last 10 Encounters:  08/01/15 140 lb 14 oz (63.9 kg)  06/14/11 142 lb 3.2 oz (64.501 kg)  02/03/11 135 lb 12.8 oz (61.598 kg)  01/18/11 135 lb 9.6 oz (61.508 kg)  12/14/10 137 lb 6.4 oz (62.324 kg)  11/09/10 131 lb (59.421 kg)  10/15/10 132 lb 9.6 oz (60.147 kg)  09/15/10 128 lb 9.6 oz (58.333 kg)  08/14/10 126 lb 12.8 oz (57.516 kg)  08/12/10 125 lb (56.7 kg)   Ideal Body Weight:  54.55 kg  BMI:  Body mass index is 24.17  kg/(m^2).  Estimated Nutritional Needs:  Kcal:  1650-1850 (26-29 kcal/kg bw) Protein:  83-96 g (1.3-1.5 g/kg bw) Fluid:  1.6-1.8 liters fluid  EDUCATION NEEDS:  No education needs identified at this time  Burtis Junes RD, LDN Clinical Nutrition Pager: [phone] 08/02/2015 3:28 PM

## 2015-08-02 NOTE — Progress Notes (Signed)
SLP Cancellation Note  Patient Details Name: NEWELL HOFFART MRN: [id number] DOB: 10-23-1940   Cancelled treatment:       Reason Eval/Treat Not Completed: Fatigue/lethargy limiting ability to participate; RN reports pt inability to follow commands and increased lethargy; will closely monitor for PO readiness   Arvil Chaco MA, [place] Pathologist    Levi Aland 08/02/2015, 8:37 AM

## 2015-08-03 ENCOUNTER — Inpatient Hospital Stay (HOSPITAL_COMMUNITY): Payer: Medicare Other

## 2015-08-03 LAB — GLUCOSE, CAPILLARY
GLUCOSE-CAPILLARY: 114 mg/dL — AB (ref 65–99)
GLUCOSE-CAPILLARY: 115 mg/dL — AB (ref 65–99)
GLUCOSE-CAPILLARY: 91 mg/dL (ref 65–99)
GLUCOSE-CAPILLARY: 105 mg/dL — AB (ref 65–99)
Glucose-Capillary: 128 mg/dL — ABNORMAL HIGH (ref 65–99)

## 2015-08-03 LAB — CBC
HEMATOCRIT: 35.1 % — AB (ref 36.0–46.0)
HEMOGLOBIN: 11.6 g/dL — AB (ref 12.0–15.0)
MCH: 29.7 pg (ref 26.0–34.0)
MCHC: 33 g/dL (ref 30.0–36.0)
MCV: 90 fL (ref 78.0–100.0)
Platelets: 212 10*3/uL (ref 150–400)
RBC: 3.9 MIL/uL (ref 3.87–5.11)
RDW: 14.7 % (ref 11.5–15.5)
WBC: 10.6 10*3/uL — ABNORMAL HIGH (ref 4.0–10.5)

## 2015-08-03 LAB — COMPREHENSIVE METABOLIC PANEL
ALBUMIN: 3.4 g/dL — AB (ref 3.5–5.0)
ALK PHOS: 49 U/L (ref 38–126)
ALT: 28 U/L (ref 14–54)
ANION GAP: 13 (ref 5–15)
AST: 83 U/L — AB (ref 15–41)
BUN: 13 mg/dL (ref 6–20)
CALCIUM: 8.9 mg/dL (ref 8.9–10.3)
CO2: 17 mmol/L — AB (ref 22–32)
Chloride: 111 mmol/L (ref 101–111)
Creatinine, Ser: 0.71 mg/dL (ref 0.44–1.00)
GFR calc Af Amer: >60 mL/min (ref >60)
GFR calc non Af Amer: >60 mL/min (ref >60)
GLUCOSE: 128 mg/dL — AB (ref 65–99)
POTASSIUM: 3.7 mmol/L (ref 3.5–5.1)
SODIUM: 141 mmol/L (ref 135–145)
Total Bilirubin: 1.6 mg/dL — ABNORMAL HIGH (ref 0.3–1.2)
Total Protein: 5.7 g/dL — ABNORMAL LOW (ref 6.5–8.1)

## 2015-08-03 MED ORDER — DEXAMETHASONE SODIUM PHOSPHATE 10 MG/ML IJ SOLN
5.0000 mg | Freq: Four times a day (QID) | INTRAMUSCULAR | Status: DC
  Administered 2015-08-04: 5 mg via INTRAVENOUS
  Filled 2015-08-03: qty 1

## 2015-08-03 MED ORDER — SODIUM CHLORIDE 0.9 % IV SOLN
INTRAVENOUS | Status: DC
  Administered 2015-08-03: 75 mL via INTRAVENOUS
  Administered 2015-08-04 – 2015-08-07 (×4): via INTRAVENOUS
  Administered 2015-08-08: 75 mL/h via INTRAVENOUS

## 2015-08-03 MED ORDER — QUETIAPINE FUMARATE 25 MG PO TABS
25.0000 mg | ORAL_TABLET | Freq: Two times a day (BID) | ORAL | Status: DC | PRN
  Filled 2015-08-03: qty 1

## 2015-08-03 MED ORDER — DEXAMETHASONE SODIUM PHOSPHATE 10 MG/ML IJ SOLN
10.0000 mg | Freq: Once | INTRAMUSCULAR | Status: AC
  Administered 2015-08-03: 10 mg via INTRAVENOUS
  Filled 2015-08-03: qty 1

## 2015-08-03 MED ORDER — CIPROFLOXACIN IN D5W 400 MG/200ML IV SOLN
400.0000 mg | Freq: Two times a day (BID) | INTRAVENOUS | Status: DC
  Administered 2015-08-03 – 2015-08-05 (×6): 400 mg via INTRAVENOUS
  Filled 2015-08-03 (×5): qty 200

## 2015-08-03 NOTE — Evaluation (Signed)
Clinical/Bedside Swallow Evaluation Patient Details  Name: [name] MRN: [id number] Date of Birth: 14-Jun-1940  Today's Date: 08/03/2015 Time: SLP Start Time (ACUTE ONLY): [id number] SLP Stop Time (ACUTE ONLY): 1042 SLP Time Calculation (min) (ACUTE ONLY): 27 min  Past Medical History:  Past Medical History  Diagnosis Date  . Bronchitis, not specified as acute or chronic   . Other chronic sinusitis   . Other chronic allergic conjunctivitis   . Allergic rhinitis, cause unspecified   . 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 Acute renal insufficiency     due to amphotericin 2012  . Irritable bowel   . Hypertension   . Diverticulosis    Past Surgical History:  Past Surgical History  Procedure Laterality Date  . Cesarean section      x 4  . Vesicovaginal fistula closure w/ tah    . Breast lumpectomy      Right-benign  . Tonsillectomy    . Appendectomy    . Bronchoscopy      Video bronch w/ TBBX 2012   HPI:  [name] is an 75 y.o. female with a history of hypertension, brought to the emergency room in code stroke status following acute onset of right-sided weakness and speech difficulty.  She has no previous history of stroke or TIA. CT scan of her head with a large left basal ganglia hemorrhage. There is localized mass effect and   Assessment / Plan / Recommendation Clinical Impression  Pt seen to test readiness to take PO, particularly oral medication, since she pulled out her Cortrak feeding tube the night before. Pt initially arousable, opening eyes with verbal and tactile stimuli, but easily falling back to significant lethargy after 30 seconds. Pt was able to sustain arousal for self feeding, but was noted to have oral dyspahgia due to right labial weakness, oral holding and then delayed swallow initiation with late cough response. Verbal and tactile curing ineffective in facilitating PO awareness or oral transit times. Pt is not appropriate for PO  intake, even minimal intake of PO meds given poor arousal and signs of oral and oropharyngeal dysphagia. Recommend ongoing short term alternate means of nutrition until arousal is more consistent. Will follow for readiness.     Aspiration Risk  Severe aspiration risk    Diet Recommendation NPO;Alternative means - temporary        Other  Recommendations Oral Care Recommendations: Oral care QID   Follow up Recommendations  Inpatient Rehab    Frequency and Duration min 2x/week  2 weeks       Prognosis Prognosis for Safe Diet Advancement: Good Barriers to Reach Goals: Language deficits      Swallow Study   General HPI: [name] is an 75 y.o. female with a history of hypertension, brought to the emergency room in code stroke status following acute onset of right-sided weakness and speech difficulty.  She has no previous history of stroke or TIA. CT scan of her head with a large left basal ganglia hemorrhage. There is localized mass effect and Type of Study: Bedside Swallow Evaluation Previous Swallow Assessment: no prior dysphagia hx Diet Prior to this Study: NPO Temperature Spikes Noted: No Respiratory Status: Room air History of Recent Intubation: No Behavior/Cognition: Lethargic/Drowsy;Requires cueing Oral Cavity Assessment: Dry Oral Care Completed by SLP: Yes Oral Cavity - Dentition: Adequate natural dentition Self-Feeding Abilities: Able to feed self;Needs assist Patient Positioning: Upright in bed Baseline Vocal Quality: Not observed Volitional Cough:  Cognitively unable to elicit Volitional Swallow: Unable to elicit    Oral/Motor/Sensory Function Overall Oral Motor/Sensory Function: Mild impairment Facial ROM: Suspected CN VII (facial) dysfunction;Reduced right Facial Symmetry: Abnormal symmetry right;Suspected CN VII (facial) dysfunction Facial Strength: Reduced right;Suspected CN VII (facial) dysfunction Lingual ROM:  (pt would not follow commands to  assess) Mandible: Within Functional Limits   Ice Chips Ice chips: Impaired Presentation: Spoon Oral Phase Impairments: Poor awareness of bolus Oral Phase Functional Implications: Oral holding   Thin Liquid Thin Liquid: Impaired Presentation: Cup;Self Fed Oral Phase Impairments: Reduced labial seal Oral Phase Functional Implications: Right anterior spillage;Prolonged oral transit Pharyngeal  Phase Impairments: Suspected delayed Swallow;Cough - Immediate;Wet Vocal Quality    Nectar Thick Nectar Thick Liquid: Not tested   Honey Thick Honey Thick Liquid: Not tested   Puree Puree: Impaired Presentation: Spoon;Self Fed Oral Phase Impairments: Reduced labial seal;Reduced lingual movement/coordination Oral Phase Functional Implications: Prolonged oral transit;Oral holding Pharyngeal Phase Impairments: Suspected delayed Swallow   Solid   GO   Solid: Not tested       Herbie Baltimore, MA CCC-SLP [phone]  Lynann Beaver 08/03/2015,11:21 AM

## 2015-08-03 NOTE — Progress Notes (Signed)
Upon assessment of patient, RN noticed that patient is more lethargic tonight and unable to follow simple commands. Patient will barely open eyes to pain and will not stay awake for more than a minute with continuous stimulation. Patient is moving left upper and lower extremities spontaneously but not to command and non-purposeful movement on right side. Dr Nicole Kindred notified of neuro change and STAT CT head ordered. RN will continue to monitor patient closely.

## 2015-08-03 NOTE — Evaluation (Signed)
Speech Language Pathology Evaluation Patient Details Name: BRITHNEY SPINUZZI MRN: [id number] DOB: 09-13-1940 Today's Date: 08/03/2015 Time: [id number] SLP Time Calculation (min) (ACUTE ONLY): 27 min  Problem List:  Patient Active Problem List   Diagnosis Date Noted  . ICH (intracerebral hemorrhage) ([place]) 08/01/2015  . Hepatitis 08/12/2010  . Renal failure 08/12/2010  . 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 CONJUNCTIVITIS, ALLERGIC 09/02/2008  . 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 ALLERGIC RHINITIS 09/02/2008  . BRONCHITIS 09/02/2008   Past Medical History:  Past Medical History  Diagnosis Date  . Bronchitis, not specified as acute or chronic   . Other chronic sinusitis   . Other chronic allergic conjunctivitis   . Allergic rhinitis, cause unspecified   . 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 Acute renal insufficiency     due to amphotericin 2012  . Irritable bowel   . Hypertension   . Diverticulosis    Past Surgical History:  Past Surgical History  Procedure Laterality Date  . Cesarean section      x 4  . Vesicovaginal fistula closure w/ tah    . Breast lumpectomy      Right-benign  . Tonsillectomy    . Appendectomy    . Bronchoscopy      Video bronch w/ TBBX 2012   HPI:  [name] is an 75 y.o. female with a history of hypertension, brought to the emergency room in code stroke status following acute onset of right-sided weakness and speech difficulty.  She has no previous history of stroke or TIA. CT scan of her head with a large left basal ganglia hemorrhage. 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 Pt was able to briefly sustain arousal to participate in basic functional tasks (washing her face, brushing her teeth and feeding herself), recognizing utensils and items and using  them appropriately, though briefly. When engaged in exclusively verbal tasks pt did not demonstrate any indication of understanding and did not verbalize or phonate with max cues. Discussed findings with pts family and encouraged they bring photos and a notebook for future therapy sessions. Will continue efforts. Pt would benefit from Inpatient rehab at d/c.     SLP Assessment  Patient needs continued Speech Lanaguage Pathology Services    Follow Up Recommendations  Inpatient Rehab    Frequency and Duration min 2x/week  2 weeks      SLP Evaluation Prior Functioning  Cognitive/Linguistic Baseline: Within functional limits  Lives With: Spouse   Cognition  Overall Cognitive Status: Impaired/Different from baseline Arousal/Alertness: Lethargic Orientation Level: Other (comment) (aphasic) Attention: Focused;Sustained Focused Attention: Appears intact (intermittently in tact) Sustained Attention: Impaired Sustained Attention Impairment: Verbal basic;Functional basic (able to sustain attention to basic tasks for 10-20 seconds) Problem Solving:  (demosntrates some basic functional problem solving)    Comprehension  Auditory Comprehension Overall Auditory Comprehension: Impaired Yes/No Questions: Impaired Basic Biographical Questions: 0-25% accurate Commands: Impaired One Step Basic Commands: 0-24% accurate EffectiveTechniques: Visual/Gestural cues Reading Comprehension Reading Status: Impaired    Expression Verbal Expression Overall Verbal Expression: Impaired (did not verbalize) Initiation: Impaired   Oral / Motor  Oral Motor/Sensory Function Overall Oral Motor/Sensory Function: Mild impairment Facial ROM: Suspected CN VII (facial) dysfunction;Reduced right Facial Symmetry: Abnormal symmetry right;Suspected CN VII (facial) dysfunction Facial Strength: Reduced right;Suspected CN VII (facial) dysfunction Lingual ROM: Other (Comment) (  could not test, would not follow commands.  ) Mandible: Within Functional Limits Motor Speech Overall Motor Speech:  (no attempts)   GO                   Herbie Baltimore, MA CCC-SLP [phone]  Lynann Beaver 08/03/2015, 11:35 AM

## 2015-08-03 NOTE — Progress Notes (Signed)
Dr. Nicole Kindred aware of CT head results and new orders placed. Dr. Nicole Kindred at bedside with patient and patient's family. RN will continue to monitor patient closely.

## 2015-08-03 NOTE — Progress Notes (Signed)
STROKE TEAM PROGRESS NOTE   HISTORY OF PRESENT ILLNESS [name] is an 75 y.o. female with a history of hypertension, brought to the emergency room in [place] stroke status following acute onset of right-sided weakness and speech difficulty. She was last known well at 5:20 PM today. She has no previous history of stroke or TIA. CT scan of her head with a large left basal ganglia hemorrhage with no extension into left lateral ventricle. Blood pressure was 168/89. Patient was unable to speak and was neglecting her right side, with a gaze to the left. She had no voluntary movement of right extremities. NIH stroke score was 25.  LSN: 5:20 PM on 08/01/2015 tPA Given: No: Acute ICH mRankin:   SUBJECTIVE (INTERVAL HISTORY) The patient's husband, Dr London Pepper, was at the bedside. Dr. Irish Elders discussed the patient's imaging and overall condition. He gave a positive outlook for her prognosis. Overnight the patient had become agitated and removed her feeding tube as well as her IV. Per Dr Irish Elders will try Seroquel prn for agitation.    OBJECTIVE Temp:  [97.6 F (36.4 C)-98.8 F (37.1 C)] 97.9 F (36.6 C) (04/16 0750) Pulse Rate:  [56-90] 71 (04/16 0800) Cardiac Rhythm:  [-] Normal sinus rhythm (04/16 0400) Resp:  [12-30] 20 (04/16 0800) BP: (115-158)/(52-110) 149/91 mmHg (04/16 0800) SpO2:  [96 %-100 %] 97 % (04/16 0800) Weight:  [63.7 kg (140 lb 6.9 oz)] 63.7 kg (140 lb 6.9 oz) (04/16 0400)  CBC:   Recent Labs Lab 08/01/15 2001  08/02/15 0819 08/03/15 0311  WBC 9.6  --  10.2 10.6*  NEUTROABS 4.5  --   --   --   HGB 12.6  < > 12.3 11.6*  HCT 37.3  < > 36.7 35.1*  MCV 90.1  --  89.7 90.0  PLT 290  --  262 212  < > = values in this interval not displayed.  Basic Metabolic Panel:   Recent Labs Lab 08/02/15 0819 08/03/15 0311  NA 141 141  K 3.6 3.7  CL 107 111  CO2 22 17*  GLUCOSE 103* 128*  BUN 11 13  CREATININE 0.77 0.71  CALCIUM 9.0 8.9    Lipid Panel: No results  found for: CHOL, TRIG, HDL, CHOLHDL, VLDL, LDLCALC HgbA1c: No results found for: HGBA1C Urine Drug Screen: No results found for: LABOPIA, COCAINSCRNUR, LABBENZ, AMPHETMU, THCU, LABBARB    IMAGING  Ct Head Wo Contrast  08/01/2015   1. Left basal ganglia hemorrhagic infarct.  2. Mild chronic small vessel white matter ischemic changes in both cerebral hemispheres.  3. Chronic bilateral frontal, bilateral ethmoid and left maxillary sinusitis.  4. Acute right maxillary sinusitis.     Chest Port 1 View 08/01/2015   1. No acute findings.  2. Accentuation of the mediastinal contours may be due to AP technique and low lung volumes.   Consider follow-up PA and lateral views of the chest in further evaluation, as clinically indicated.     MRI with and without contrast  08/02/2015 1. Increased size of acute intraparenchymal hemorrhage centered at the left basal ganglia, now measuring 5.6 x 3.3 x 3.6 cm (estimated volume 33 mL). Associated localized vasogenic edema and mass effect with up to 6 mm of left-to-right shift. Intraventricular extension with small volume blood layering in the occipital horns of both lateral ventricles. No hydrocephalus or evidence of ventricular trapping. 2. No other acute intracranial abnormality. 3. Mild chronic small vessel ischemic disease.   MRI/MRA head without contrast 08/02/2015  1. No vascular abnormality or aneurysm underlying the left basal ganglia hemorrhage identified. 2. No large or proximal arterial branch occlusion. Short-segment stenoses at the proximal left M1 and mid right M1 segments. 3. 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    Portable abdominal x-ray  08/02/2015 The tip of the feeding tube overlies the distal descending duodenum.     PHYSICAL EXAM  Neurologic Examination: Mental  Status: Lethargic. The patient does not attempt to speak. She follows only some simple commands. Cranial Nerves: II-right homonymous hemianopsia. III/IV/VI-Pupils were equal and reacted normally to light. Extraocular movements were full and conjugate.  VII-right lower facial weakness facial weakness. VIII-normal. X-no speech output. Motor: Right hemiplegia with moderate spasticity; normal strength and tone in the left extremities. Sensory: Withdraws right extremities to painful stimulation. Deep Tendon Reflexes: 2+, asymmetric with increased responses elicited from right extremities. Plantars: Extensor on the right and flexor on the left Gait - not tested      ASSESSMENT/PLAN [name] is a 75 y.o. female with history hypertension and hyperlipidemia presenting with right hemiparesis and speech difficulties.  She did not receive IV t-PA due to large left basal ganglia hemorrhage.  Hemorrhagic Stroke:  Non-dominant hemorrhagic infarct probably secondary to hypertension.  Resultant    MRI - as above - Increased size of acute intraparenchymal hemorrhage   MRA - as above - No vascular abnormality or aneurysm underlying the left basal ganglia hemorrhage identified.  Carotid Doppler not indicated  2D Echo - performed January 2017  LDL - not performed  HgbA1c - not performed  VTE prophylaxis - SCDs Diet NPO time specified  No antithrombotic prior to admission, now on No antithrombotic secondary to hemorrhage  Ongoing aggressive stroke risk factor management  Therapy recommendations: Pending  Disposition:  Pending  Hypertension  Stable  Cozaar 100 mg daily along with PRN hydralazine and PRN labetalol  Hyperlipidemia  Home meds: Lipitor 20 mg daily - held secondary to hemorrhage  LDL not performed, goal < 70  Continue statin at discharge    Other Stroke Risk Factors  Advanced age  ETOH use   Other Active Problems  UTI - Cipro ( changed to IV  until PO access is again available )  NPO - Cortrac feeding tube - dietitian consult (the patient removed the cortrac tube during the night. No one available today to replace. Will be ordered for tomorrow). The patient failed her swallowing evaluation Sunday. Add NS IV.  Agitation - PRN Seroquel  Abnormal chest x-ray - PA and lateral views recommended at some point.  Bleed - Increased size of acute intraparenchymal hemorrhage. Discussed with Dr. Irish Elders. Difficult to compare MRI to CT  scan. Will repeat head CT in a.m..  Blood pressure - systolic blood pressure goal less than 160 per Dr Irish Elders. PO cozaar on hold.   Hospital day # 2  Mikey Bussing PA-C [facility] Pager [phone] 08/03/2015, 8:26 AM  Possibly slight increase in bleed on MRI but difficult to access as no GRE sequence.  Repeat CTH again in AM Initial ICH score of 1 S/p discussion with husband Leotis Pain    To contact Stroke Continuity provider, please refer to [email or web address] After hours, contact General Neurology

## 2015-08-03 NOTE — Progress Notes (Signed)
RN went to assess patient and patient had pulled out cortrak feeding tube along with peripheral IV. Patient actively trying to get out of bed and pulling lines out. MD notified. Orders received for soft waist belt restraint. RN will continue to monitor patient closely.

## 2015-08-03 NOTE — Progress Notes (Signed)
PT Cancellation Note  Patient Details Name: ANYKA STIKA MRN: [id number] DOB: 1941/02/15   Cancelled Treatment:    Reason Eval/Treat Not Completed: Patient not medically ready (remains on bedrest)   Duncan Dull 08/03/2015, 8:25 AM Alben Deeds, PT DPT  [phone]

## 2015-08-04 ENCOUNTER — Inpatient Hospital Stay (HOSPITAL_COMMUNITY): Payer: Medicare Other

## 2015-08-04 ENCOUNTER — Ambulatory Visit: Payer: Medicare Other | Admitting: Cardiology

## 2015-08-04 LAB — GLUCOSE, CAPILLARY
GLUCOSE-CAPILLARY: 111 mg/dL — AB (ref 65–99)
GLUCOSE-CAPILLARY: 124 mg/dL — AB (ref 65–99)
GLUCOSE-CAPILLARY: 129 mg/dL — AB (ref 65–99)
GLUCOSE-CAPILLARY: 136 mg/dL — AB (ref 65–99)
GLUCOSE-CAPILLARY: 94 mg/dL (ref 65–99)
Glucose-Capillary: 128 mg/dL — ABNORMAL HIGH (ref 65–99)
Glucose-Capillary: 121 mg/dL — ABNORMAL HIGH (ref 65–99)

## 2015-08-04 LAB — SODIUM
SODIUM: 141 mmol/L (ref 135–145)
SODIUM: 139 mmol/L (ref 135–145)
Sodium: 140 mmol/L (ref 135–145)

## 2015-08-04 MED ORDER — SODIUM CHLORIDE 3 % IV SOLN
INTRAVENOUS | Status: DC
  Filled 2015-08-04 (×6): qty 500

## 2015-08-04 NOTE — Progress Notes (Signed)
PT Cancellation Note  Patient Details Name: [name] MRN: [id number] DOB: Aug 22, 1940   Cancelled Treatment:    Reason Eval/Treat Not Completed: Medical issues which prohibited therapy.  Continued changes on CT RN recommending hold again today.  PT will continue to follow for stability.  Thanks,    Barbarann Ehlers. [place], [place], DPT [phone]   08/04/2015, 9:43 AM

## 2015-08-04 NOTE — Progress Notes (Signed)
Speech Language Pathology Treatment:    Patient Details Name: [name] MRN: [id number] DOB: 01/18/1941 Today's Date: 08/04/2015 Time: 1114-       MBS scheduled today at 1330.      Orbie Pyo [place].Ed [employer] [phone]

## 2015-08-04 NOTE — Progress Notes (Signed)
Dr. Leonie Man aware that patient is more awake and was able to participate in SLP's evaluation. Plans for MBS this afternoon. Patient's sons at bedside. Per Dr. Leonie Man, may hold on central line placement and reevaluate.

## 2015-08-04 NOTE — Progress Notes (Signed)
Speech Language Pathology Treatment: Dysphagia;Cognitive-Linquistic  Patient Details Name: GLADENE ISHAM MRN: [id number] DOB: 12-15-1940 Today's Date: 08/04/2015 Time: [id number] SLP Time Calculation (min) (ACUTE ONLY): 16 min  Assessment / Plan / Recommendation Clinical Impression  Pt much more alert than previously per sons' report. Pt continues to be nonverbal, however made attempts to communicate with SLP, RN and sons. Vocalizations x 2 noted yet do not appear to be meaningful utterances.  Labial movement first observed during automatic speech activity (singing) that continued for remainder of session. Pt unable to nod head yes/shake head no when SLP asked basic biographical/environmental questions, suggestive of possible apraxia. With use of communication board, pt able to identify items with 90% accuracy. Left communication board in room in order for pt to indicate wants/needs with family, RN, MD, etc.  Immediate expectoration of thin liquids observed x1. Multiple swallows observed during intake of puree, indicative of possible pharyngeal residue. As pt appears to be more alert than yesterday, recommend instrumental testing to r/o penetration/aspiration and advise initiation of diet. SLP will perform MBS @1330 .    HPI HPI: ADLAI SOS is an 75 y.o. female with a history of hypertension, brought to the emergency room in code stroke status following acute onset of right-sided weakness and speech difficulty.  She has no previous history of stroke or TIA. CT scan of her head with a large left basal ganglia hemorrhage. There is localized mass effect and      SLP Plan  MBS;New goals to be determined pending instrumental study     Recommendations  Diet recommendations: NPO Medication Administration: Via alternative means Compensations: Minimize environmental distractions;Slow rate;Small sips/bites;Other (Comment);Follow solids with liquid (check for pocketing)             Oral Care  Recommendations: Oral care QID Follow up Recommendations: Inpatient Rehab Plan: MBS;New goals to be determined pending instrumental study     GO                Thaily Hackworth [facility] 08/04/2015, 2:46 PM

## 2015-08-04 NOTE — Progress Notes (Signed)
OT Cancellation Note  Patient Details Name: [name] MRN: [id number] DOB: May 20, 1940   Cancelled Treatment:    Reason Eval/Treat Not Completed: Patient not medically ready.  Will reattempt when medically stable.   [name] Alice, OTR/L [id number]  08/04/2015, 9:54 AM

## 2015-08-04 NOTE — Progress Notes (Signed)
MBSS complete. Full report located under chart review in imaging section.  Recommendation:  MBS complete. Pt exhibited mild-mod pharyngeal phase dysphagia characterized by sensory deficits. Delayed swallow initiation to the vallecula observed across consistencies, with silent penetration to the vocal cords during intake of nectar thick liquids. Due to language deficits/apraxia, max verbal and visual cues were ineffective to elicit cough to clear penetrates. Mild right sided labial residue noted without pt awareness which may result in pocketing due to weakness/apraxia. Pt educated re: results of MBS and diet recommendation of honey thick liquids and Dysphagia 2 (fine chop) textures, with meds crushed in puree. Scan of esophagus noted stasis in mid esophagus (SLP cannot diagnose impairments below the UES), pt may benefit from esophageal workup. Recommend use of esophageal precautions (sit upright during intake, remain upright at least 30 minutes after intake, alternate solids and liquids). SLP will continue to follow to provide dysphagia tx.   Impact on safety and function Moderate aspiration risk       Houston Siren M.Ed [employer] [phone]

## 2015-08-04 NOTE — Progress Notes (Signed)
Utilization review completed.  

## 2015-08-04 NOTE — Progress Notes (Signed)
STROKE TEAM PROGRESS NOTE   SUBJECTIVE (INTERVAL HISTORY) Patient had a tube for feeding, but she pulled it out over the weekend. No one available to place Coretrak over the weekend. She remains drowsy but can be aroused with some difficulty and follows occasional midline commands. Blood pressure has been adequately controlled. Follow-up CT scan from last night shows stable hemorrhage but increase in edema and 3.5 mm left-to-right midline shift   OBJECTIVE Temp:  [97.3 F (36.3 C)-98.3 F (36.8 C)] 97.4 F (36.3 C) (04/17 0800) Pulse Rate:  [55-86] 62 (04/17 0720) Cardiac Rhythm:  [-] Sinus bradycardia (04/17 0400) Resp:  [11-21] 13 (04/17 0720) BP: (112-194)/(52-89) 123/55 mmHg (04/17 0720) SpO2:  [95 %-99 %] 97 % (04/17 0720) Weight:  [64 kg (141 lb 1.5 oz)] 64 kg (141 lb 1.5 oz) (04/17 0500)  CBC:   Recent Labs Lab 08/01/15 2001  08/02/15 0819 08/03/15 0311  WBC 9.6  --  10.2 10.6*  NEUTROABS 4.5  --   --   --   HGB 12.6  < > 12.3 11.6*  HCT 37.3  < > 36.7 35.1*  MCV 90.1  --  89.7 90.0  PLT 290  --  262 212  < > = values in this interval not displayed.  Basic Metabolic Panel:   Recent Labs Lab 08/02/15 0819 08/03/15 0311  NA 141 141  K 3.6 3.7  CL 107 111  CO2 22 17*  GLUCOSE 103* 128*  BUN 11 13  CREATININE 0.77 0.71  CALCIUM 9.0 8.9    Lipid Panel: No results found for: CHOL, TRIG, HDL, CHOLHDL, VLDL, LDLCALC HgbA1c: No results found for: HGBA1C Urine Drug Screen: No results found for: LABOPIA, COCAINSCRNUR, LABBENZ, AMPHETMU, THCU, LABBARB    IMAGING  Ct Head Wo Contrast  08/03/2015 1. Increasing size of left basal ganglia hemorrhage since last CT (but same as on MRI), now measuring 5.1 x 3.5 x 4.5 cm. 2. Increasing surrounding vasogenic edema and mass effect with progressive effacement of the left lateral ventricle and midline shift now measuring 3.5 mm. 3. Diffuse sinus disease.  08/01/2015   1. Left basal ganglia hemorrhagic infarct.  2. Mild  chronic small vessel white matter ischemic changes in both cerebral hemispheres.  3. Chronic bilateral frontal, bilateral ethmoid and left maxillary sinusitis.  4. Acute right maxillary sinusitis.   Chest Port 1 View 08/01/2015   1. No acute findings.  2. Accentuation of the mediastinal contours may be due to AP technique and low lung volumes.   MRI/MRA head without contrast 08/03/2015 1. No vascular abnormality or aneurysm underlying the left basal ganglia hemorrhage identified. 2. No large or proximal arterial branch occlusion. Short-segment stenoses at the proximal left M1 and mid right M1 segments. 3. 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  MRI with and without contrast  08/02/2015 1. Increased size of acute intraparenchymal hemorrhage centered at the left basal ganglia, now measuring 5.6 x 3.3 x 3.6 cm (estimated volume 33 mL). Associated localized vasogenic edema and mass effect with up to 6 mm of left-to-right shift. Intraventricular extension with small volume blood layering in the occipital horns of both lateral ventricles. No hydrocephalus or evidence of ventricular trapping. 2. No other acute intracranial abnormality. 3. Mild chronic small vessel ischemic disease.  Portable abdominal x-ray  08/02/2015 The tip of the feeding tube overlies the distal descending duodenum.  PHYSICAL EXAM Neurologic Examination: Mental Status: Lethargic. Can be aroused with difficulty The patient does not attempt to speak. She follows only some simple midline commands. Cranial Nerves: Does not blink to threat on the right but does so on the left. III/IV/VI-Pupils were equal and reacted normally to light. Extraocular movements were full and conjugate but slight left gaze preference.  VII-right lower facial weakness facial  weakness. VIII-normal. X-no speech output. Motor: Right hemiplegia with 0/5 strength. Minimum withdrawal to painful stimuli. normal strength and tone in the left extremities. Sensory: Withdraws right extremities to painful stimulation. Deep Tendon Reflexes: 2+, asymmetric with increased responses elicited from right extremities. Plantars: Extensor on the right and flexor on the left Gait - not tested   ASSESSMENT/PLAN Ms. LELLA SARIC is a 75 y.o. female with history hypertension and hyperlipidemia presenting with right hemiparesis and speech difficulties.  She did not receive IV t-PA due to large left basal ganglia hemorrhage.  Hemorrhagic Stroke:  Dominant L basal ganglia hemorrhage with cytotoxic cerebral edema and midline shift, hemorrhage probably secondary to hypertension.  Resultant    MRI - as above - Increased size of acute intraparenchymal hemorrhage   MRA - as above - No vascular abnormality or aneurysm underlying the left basal ganglia hemorrhage identified.  Repeat CT this am no significant change in hemorrhage size since yesterday, 4.5 mm shift, edema, mass effect, no hydrocephalus   2D Echo - EF 60-65%, mild AS - performed January 2017  VTE prophylaxis - SCDs Diet NPO time specified. Tube remains out this am. For ST assessment this am. If fails, replace tube (expect she will need tube)  No antithrombotic prior to admission, now on No antithrombotic secondary to hemorrhage  No indication for ongoing decadron. Will discontinue  Ongoing aggressive stroke risk factor management  Dr. Leonie Man discussed with patient's husband over the phone - will start 3% and place central line - CCM to place line  Therapy recommendations: pending   Disposition:  pending   Hypertensive emergency  BP as high as 193/90 & 120/106 in setting of neurologic symptoms  160s this am  Received several doses of  PRN antihypertensives yesterday  Resume Cozaar 100 mg daily once tube  placed  Hyperlipidemia  Home meds: Lipitor 20 mg daily   Continue statin at discharge  Other Stroke Risk Factors  Advanced age  ETOH use  Other Active Problems  UTI - Cipro ( changed to IV until PO access is again available )  Agitation - PRN Seroquel  Abnormal chest x-ray - PA and lateral views recommended at some point.  Blood pressure - systolic blood pressure goal less than 160 per Dr Irish Elders. PO cozaar on hold.  Hospital day # [place] for Pager information 08/04/2015 9:25 AM  I have personally examined this patient, reviewed notes, independently viewed imaging studies, participated in medical decision making and plan of care. I have made any additions or clarifications directly to the above note. Agree with note above. The patient has a moderate-sized intracerebral hemorrhage with now developing cytotoxic edema and midline shift. She remains at risk for neurological worsening, developing hydrocephalus, death and needs close neurological monitoring and aggressive treatment for cerebral edema. I had a long discussion over the telephone with the patient's husband as well as with her son at the bedside regarding her situation and treatment plan. Family wishes to pursue aggressive care at the present time. They will get critical care medicine to  put in central line and start hypertonic saline with sodium goal of 150-155. Maintain close neurological monitoring and strict blood pressure control. Repeat CT scan of the head tomorrow morning. Speech pathology to check swallow eval and modified barium. She would likely need to conduct tube to be reinserted if she fails swallow eval. Discussed with Dr. Chase Caller critical care medicine This patient is critically ill and at significant risk of neurological worsening, death and care requires constant monitoring of vital signs, hemodynamics,respiratory and cardiac monitoring, extensive review of  multiple databases, frequent neurological assessment, discussion with family, other specialists and medical decision making of high complexity.I have made any additions or clarifications directly to the above note.This critical care time does not reflect procedure time, or teaching time or supervisory time of PA/NP/Med Resident etc but could involve care discussion time.  I spent 90 minutes of neurocritical care time  in the care of  this patient.     Antony Contras, MD Medical Director [facility] Pager: [phone] 08/04/2015 1:47 PM  To contact Stroke Continuity provider, please refer to [email or web address] After hours, contact General Neurology

## 2015-08-04 NOTE — Progress Notes (Addendum)
Patient is more alert, attempts to follow commands with L side mostly by mimic. Patient also passed MBS. Dr. Leonie Man aware and instructed to hold on central line placement and 3% saline at this time.  Dr. Leonie Man spoke with husband via telephone and husband verbalized agreement to RN. Cortrak not placed d/t diet order. Will continue to monitor.

## 2015-08-05 ENCOUNTER — Inpatient Hospital Stay (HOSPITAL_COMMUNITY): Payer: Medicare Other

## 2015-08-05 DIAGNOSIS — G936 Cerebral edema: Secondary | ICD-10-CM | POA: Diagnosis present

## 2015-08-05 DIAGNOSIS — G935 Compression of brain: Secondary | ICD-10-CM | POA: Diagnosis present

## 2015-08-05 LAB — SODIUM
SODIUM: 140 mmol/L (ref 135–145)
Sodium: 141 mmol/L (ref 135–145)

## 2015-08-05 LAB — GLUCOSE, CAPILLARY
GLUCOSE-CAPILLARY: 86 mg/dL (ref 65–99)
GLUCOSE-CAPILLARY: 98 mg/dL (ref 65–99)
Glucose-Capillary: 107 mg/dL — ABNORMAL HIGH (ref 65–99)
Glucose-Capillary: 85 mg/dL (ref 65–99)
Glucose-Capillary: 92 mg/dL (ref 65–99)
Glucose-Capillary: 99 mg/dL (ref 65–99)

## 2015-08-05 MED ORDER — ATORVASTATIN CALCIUM 10 MG PO TABS
20.0000 mg | ORAL_TABLET | Freq: Every day | ORAL | Status: DC
  Administered 2015-08-05 – 2015-08-07 (×2): 20 mg via ORAL
  Filled 2015-08-05: qty 1
  Filled 2015-08-05 (×3): qty 2

## 2015-08-05 MED ORDER — HYDROCODONE-ACETAMINOPHEN 5-325 MG PO TABS: 1.0000 | ORAL_TABLET | ORAL | Status: DC | PRN

## 2015-08-05 MED ORDER — RESOURCE THICKENUP CLEAR PO POWD
ORAL | Status: DC | PRN
  Filled 2015-08-05: qty 125

## 2015-08-05 MED ORDER — FLUTICASONE PROPIONATE 50 MCG/ACT NA SUSP
1.0000 | Freq: Every day | NASAL | Status: DC | PRN
  Filled 2015-08-05: qty 16

## 2015-08-05 MED ORDER — ESTROGENS CONJUGATED 0.3 MG PO TABS
0.3000 mg | ORAL_TABLET | ORAL | Status: DC
  Administered 2015-08-05 – 2015-08-11 (×2): 0.3 mg via ORAL
  Filled 2015-08-05 (×4): qty 1

## 2015-08-05 MED ORDER — ONDANSETRON HCL 4 MG PO TABS: 8.0000 mg | ORAL_TABLET | Freq: Three times a day (TID) | ORAL | Status: DC | PRN

## 2015-08-05 MED ORDER — LORATADINE 10 MG PO TABS
10.0000 mg | ORAL_TABLET | Freq: Every day | ORAL | Status: DC
  Administered 2015-08-05: 10 mg via ORAL
  Filled 2015-08-05 (×4): qty 1

## 2015-08-05 MED ORDER — FLORA-Q PO CAPS
1.0000 | ORAL_CAPSULE | Freq: Two times a day (BID) | ORAL | Status: DC
  Administered 2015-08-05 (×2): 1 via ORAL
  Filled 2015-08-05 (×3): qty 1

## 2015-08-05 NOTE — Progress Notes (Signed)
STROKE TEAM PROGRESS NOTE   SUBJECTIVE (INTERVAL HISTORY) Patient  Is much more alert today. She remains aphasic but is falling more commands. She remains plegic on the right. Blood pressure has been adequately controlled. Follow-up CT scan from today shows stable hemorrhage and edema and 3.5 mm left-to-right midline shift   OBJECTIVE Temp:  [97.6 F (36.4 C)-98.4 F (36.9 C)] 98.4 F (36.9 C) (04/18 1200) Pulse Rate:  [54-74] 58 (04/18 1300) Cardiac Rhythm:  [-] Normal sinus rhythm (04/18 0800) Resp:  [11-23] 16 (04/18 1300) BP: (119-182)/(58-114) 152/82 mmHg (04/18 1300) SpO2:  [94 %-100 %] 99 % (04/18 1300) Weight:  [142 lb 3.2 oz (64.5 kg)] 142 lb 3.2 oz (64.5 kg) (04/18 0500)  CBC:   Recent Labs Lab 08/01/15 2001  08/02/15 0819 08/03/15 0311  WBC 9.6  --  10.2 10.6*  NEUTROABS 4.5  --   --   --   HGB 12.6  < > 12.3 11.6*  HCT 37.3  < > 36.7 35.1*  MCV 90.1  --  89.7 90.0  PLT 290  --  262 212  < > = values in this interval not displayed.  Basic Metabolic Panel:   Recent Labs Lab 08/02/15 0819 08/03/15 0311  08/05/15 0320 08/05/15 1118  NA 141 141  < > 140 141  K 3.6 3.7  --   --   --   CL 107 111  --   --   --   CO2 22 17*  --   --   --   GLUCOSE 103* 128*  --   --   --   BUN 11 13  --   --   --   CREATININE 0.77 0.71  --   --   --   CALCIUM 9.0 8.9  --   --   --   < > = values in this interval not displayed.  Lipid Panel: No results found for: CHOL, TRIG, HDL, CHOLHDL, VLDL, LDLCALC HgbA1c: No results found for: HGBA1C Urine Drug Screen: No results found for: LABOPIA, COCAINSCRNUR, LABBENZ, AMPHETMU, THCU, LABBARB    IMAGING  Ct Head Wo Contrast  08/03/2015 1. Increasing size of left basal ganglia hemorrhage since last CT (but same as on MRI), now measuring 5.1 x 3.5 x 4.5 cm. 2. Increasing surrounding vasogenic edema and mass effect with progressive effacement of the left lateral ventricle and midline shift now measuring 3.5 mm. 3. Diffuse sinus  disease.  08/01/2015   1. Left basal ganglia hemorrhagic infarct.  2. Mild chronic small vessel white matter ischemic changes in both cerebral hemispheres.  3. Chronic bilateral frontal, bilateral ethmoid and left maxillary sinusitis.  4. Acute right maxillary sinusitis.   Chest Port 1 View 08/01/2015   1. No acute findings.  2. Accentuation of the mediastinal contours may be due to AP technique and low lung volumes.   MRI/MRA head without contrast 08/03/2015 1. No vascular abnormality or aneurysm underlying the left basal ganglia hemorrhage identified. 2. No large or proximal arterial branch occlusion. Short-segment stenoses at the proximal left M1 and mid right M1 segments. 3. 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  MRI with and without contrast  08/02/2015 1. Increased size of acute intraparenchymal hemorrhage centered at the left basal ganglia, now measuring 5.6 x 3.3 x 3.6 cm (estimated volume 33 mL). Associated localized vasogenic edema and mass effect  with up to 6 mm of left-to-right shift. Intraventricular extension with small volume blood layering in the occipital horns of both lateral ventricles. No hydrocephalus or evidence of ventricular trapping. 2. No other acute intracranial abnormality. 3. Mild chronic small vessel ischemic disease.  Portable abdominal x-ray  08/02/2015 The tip of the feeding tube overlies the distal descending duodenum.   PHYSICAL EXAM Neurologic Examination: Mental Status: Awake alert and interactive The patient does  attempt to speak but had severe expressive aphasia.. She follows only some simple midline commands. Cranial Nerves: Does not blink to threat on the right but does so on the left. III/IV/VI-Pupils were equal and reacted normally to light. Extraocular movements were full and conjugate  but slight left gaze preference.  VII-right lower facial weakness facial weakness. VIII-normal. X-no speech output. Motor: Right hemiplegia with 0/5 strength. Minimum withdrawal to painful stimuli. normal strength and tone in the left extremities. Sensory: Withdraws right extremities to painful stimulation. Deep Tendon Reflexes: 2+, asymmetric with increased responses elicited from right extremities. Plantars: Extensor on the right and flexor on the left Gait - not tested   ASSESSMENT/PLAN Ms. NATASIA HEROUX is a 75 y.o. female with history hypertension and hyperlipidemia presenting with right hemiparesis and speech difficulties.  She did not receive IV t-PA due to large left basal ganglia hemorrhage.  Hemorrhagic Stroke:  Dominant L basal ganglia hemorrhage with cytotoxic cerebral edema and midline shift, hemorrhage probably secondary to hypertension.  Resultant    MRI - as above - Increased size of acute intraparenchymal hemorrhage   MRA - as above - No vascular abnormality or aneurysm underlying the left basal ganglia hemorrhage identified.  Repeat CT this am no significant change in hemorrhage size since yesterday, 4.5 mm shift, edema, mass effect, no hydrocephalus   2D Echo - EF 60-65%, mild AS - performed January 2017  VTE prophylaxis - SCDs DIET DYS 2 Room service appropriate?: Yes; Fluid consistency:: Honey Thick. Tube remains out this am. For ST assessment this am. If fails, replace tube (expect she will need tube)  No antithrombotic prior to admission, now on No antithrombotic secondary to hemorrhage  No indication for ongoing decadron. Will discontinue  Ongoing aggressive stroke risk factor management  Dr. Leonie Man discussed with patient's husband over the phone - will start 3% and place central line - CCM to place line  Therapy recommendations: pending   Disposition:  pending   Hypertensive emergency  BP as high as 193/90 & 120/106 in setting of neurologic  symptoms  160s this am  Received several doses of  PRN antihypertensives yesterday  Resume Cozaar 100 mg daily once tube placed  Hyperlipidemia  Home meds: Lipitor 20 mg daily   Continue statin at discharge  Other Stroke Risk Factors  Advanced age  ETOH use  Other Active Problems  UTI - Cipro ( changed to IV until PO access is again available )  Agitation - PRN Seroquel  Abnormal chest x-ray - PA and lateral views recommended at some point.  Blood pressure - systolic blood pressure goal less than 160 per Dr Irish Elders. PO cozaar on hold.  Hospital day # [place] for Pager information 08/05/2015 1:51 PM  I have personally examined this patient, reviewed notes, independently viewed imaging studies, participated in medical decision making and plan of care. I have made any additions or clarifications directly to the above note. Agree with note above. The patient has a moderate-sized intracerebral hemorrhage with  mild cytotoxic edema and midline shift. She remains at risk for neurological worsening, developing hydrocephalus, death and needs close neurological monitoring and aggressive treatment for cerebral edema. I had a long discussion   with the patient's husband as well as with her 3 sons and daughter in law at the bedside regarding her situation and treatment plan.  Continue close neurological observation and strict blood pressure control. We will hold off on hypertonic saline has she has shown clinical improvement and there is radiological stability of her cerebral edema. Plan to mobilize out of bed today. Physical occupational therapy and rehabilitation consults. Patient had encouraged to take oral intake. Resume home medications This patient is critically ill and at significant risk of neurological worsening, death and care requires constant monitoring of vital signs, hemodynamics,respiratory and cardiac monitoring, extensive review of  multiple databases, frequent neurological assessment, discussion with family, other specialists and medical decision making of high complexity.I have made any additions or clarifications directly to the above note.This critical care time does not reflect procedure time, or teaching time or supervisory time of PA/NP/Med Resident etc but could involve care discussion time.  I spent 40 minutes of neurocritical care time  in the care of  this patient.     Antony Contras, MD Medical Director [facility] Pager: [phone] 08/05/2015 1:51 PM  To contact Stroke Continuity provider, please refer to [email or web address] After hours, contact General Neurology

## 2015-08-05 NOTE — Evaluation (Signed)
Occupational Therapy Evaluation Patient Details Name: [name] MRN: [id number] DOB: January 09, 1941 Today's Date: 08/05/2015    History of Present Illness 75 y.o. female admitted to [facility] on 08/01/15 due to R sided weakness, facial droop and speech difficulties.  MRI revealed L basal ganglia hemorrhagic infarct.  Pt with significant PMHx of HTN.   Clinical Impression   Pt admitted with above. She demonstrates the below listed deficits and will benefit from continued OT to maximize safety and independence with BADLs.  Pt presents to OT with significant communication deficits, Rt hemiplegia, ? Motor planning deficits, impaired balance, impulsivity.  She currently requires mod A for functional transfers and mod A overall for ADLs.  Family is very supportive and is able to arrange 24 hour assist at discharge.  Recommend CIR.       Follow Up Recommendations  CIR;Supervision/Assistance - 24 hour    Equipment Recommendations  3 in 1 bedside comode;Wheelchair (measurements OT)    Recommendations for Other Services Rehab consult     Precautions / Restrictions Precautions Precautions: Fall Precaution Comments: right sided weakness, impulsivity.       Mobility Bed Mobility Overal bed mobility: Needs Assistance Bed Mobility: Supine to Sit     Supine to sit: Min assist     General bed mobility comments: Pt hooked Lt foot under right.  She requires min A to move LEs off bed and to lift trunk   Transfers Overall transfer level: Needs assistance Equipment used: 1 person hand held assist;2 person hand held assist Transfers: Sit to/from [employer] Sit to Stand: +2 safety/equipment;Mod assist   Squat pivot transfers: Mod assist     General transfer comment: Two person mod assist to stand from lower recliner chair to support trunk and block right leg.  Assist needed to power up and to control descent to sit.     Balance Overall balance assessment: Needs  assistance Sitting-balance support: Feet supported Sitting balance-Leahy Scale: Fair     Standing balance support: Single extremity supported Standing balance-Leahy Scale: Poor Standing balance comment: Two person mod assist to stand and work on weight shifting and balance reactions in standing.  OT on weak side reports that leg doesn't feel flaccid in standing, but she doesn't seem to kick in any muscle firing in that leg to prevent LOB when allowed to go over to the right.  Pt does pull to the left with her left upper extremity to try to correct LOB to the right.                             ADL Overall ADL's : Needs assistance/impaired Eating/Feeding: Minimal assistance;Bed level   Grooming: Wash/dry hands;Wash/dry face;Minimal assistance;Sitting   Upper Body Bathing: Minimal assitance;Sitting   Lower Body Bathing: Moderate assistance;Sit to/from stand   Upper Body Dressing : Maximal assistance;Sitting   Lower Body Dressing: Sit to/from stand;Maximal assistance Lower Body Dressing Details (indicate cue type and reason): Pt able to don socks with min A and doff with min guard assist  Toilet Transfer: Moderate assistance;Squat-pivot;BSC   Toileting- Clothing Manipulation and Hygiene: Maximal assistance;Sit to/from stand       Functional mobility during ADLs: Moderate assistance General ADL Comments: Pt appears very motivated      Vision Additional Comments: difficult to accurately assess.  She is able to identify some pictures on communication board, but language deficits make assessment difficult    Perception Perception Perception Tested?: Yes  Praxis Praxis Praxis tested?: Deficits Deficits: Ideomotor (questionably )    Pertinent Vitals/Pain Pain Assessment: Faces Pain Score: 0-No pain Faces Pain Scale: No hurt     Hand Dominance     Extremity/Trunk Assessment Upper Extremity Assessment Upper Extremity Assessment: RUE deficits/detail RUE Deficits  / Details: Rt UE with min - mod spasticity.  She demonstrates ~30% hand to mouth and able to initiate small range of extension.  RUE Coordination: decreased fine motor;decreased gross motor   Lower Extremity Assessment Lower Extremity Assessment: Defer to PT evaluation RLE Deficits / Details: right leg with some mild muscle tone, so not flaccid, however, inconsistant active movement (OT noted that toes pulled forward to donn socks in bed, but may be synergistic or reflexive as she was unable to repeat functionally later.     Cervical / Trunk Assessment Cervical / Trunk Assessment: Other exceptions Cervical / Trunk Exceptions: weakness Rt trunk    Communication Communication Communication: Receptive difficulties;Expressive difficulties   Cognition Arousal/Alertness: Awake/alert Behavior During Therapy: Impulsive Overall Cognitive Status: Difficult to assess Area of Impairment: Attention;Following commands;Safety/judgement;Awareness;Problem solving   Current Attention Level: Sustained   Following Commands: Follows one step commands inconsistently;Follows one step commands with increased time Safety/Judgement: Decreased awareness of safety;Decreased awareness of deficits Awareness: Intellectual Problem Solving: Difficulty sequencing;Requires verbal cues;Requires tactile cues;Slow processing General Comments: Pt follows commands with multi modal cuing inconsitently.  She is impulsive.  Difficult to accurately assess cognition due to severity of language deficits    General Comments       Exercises       Shoulder Instructions      Home Living Family/patient expects to be discharged to:: Private residence Living Arrangements: Spouse/significant other Available Help at Discharge: Family;Available 24 hours/day Type of Home: House Home Access: Stairs to enter [employer] of Steps: 1 + 1   Home Layout: Two level;1/2 bath on main level Alternate Level Stairs-Number of  Steps: full flight        Bathroom Toilet: Standard         Additional Comments: .  Family can hire assist if needed.  house has the capability of having an elevator installed - sons are contractors and can make the renovations       Prior Functioning/Environment Level of Independence: Independent             OT Diagnosis: Generalized weakness;Cognitive deficits;Disturbance of vision;Hemiplegia dominant side;Apraxia   OT Problem List: Decreased strength;Decreased range of motion;Decreased activity tolerance;Impaired balance (sitting and/or standing);Impaired vision/perception;Decreased coordination;Decreased cognition;Decreased safety awareness;Decreased knowledge of use of DME or AE;Impaired tone;Impaired UE functional use   OT Treatment/Interventions: Self-care/ADL training;Neuromuscular education;DME and/or AE instruction;Therapeutic activities;Cognitive remediation/compensation;Visual/perceptual remediation/compensation;Patient/family education;Balance training;Manual therapy    OT Goals(Current goals can be found in the care plan section) Acute Rehab OT Goals Patient Stated Goal: unable to state OT Goal Formulation: With patient Time For Goal Achievement: 08/19/15 Potential to Achieve Goals: Good ADL Goals Pt Will Perform Grooming: with mod assist;standing Pt Will Perform Upper Body Bathing: with supervision;sitting Pt Will Perform Lower Body Bathing: with min assist;sit to/from stand Pt Will Perform Upper Body Dressing: with min assist;sitting Pt Will Perform Lower Body Dressing: with mod assist;sit to/from stand Pt Will Transfer to Toilet: stand pivot transfer;with min assist;bedside commode Pt Will Perform Toileting - Clothing Manipulation and hygiene: with min assist;sit to/from stand  OT Frequency: Min 3X/week   Barriers to D/C:            Co-evaluation PT/OT/SLP Co-Evaluation/Treatment: Yes Reason for  Co-Treatment: Necessary to address cognition/behavior  during functional activity;For patient/therapist safety PT goals addressed during session: Mobility/safety with mobility;Balance;Strengthening/ROM OT goals addressed during session: ADL's and self-care;Strengthening/ROM      End of Session Equipment Utilized During Treatment: Gait belt Nurse Communication: Mobility status  Activity Tolerance: Patient tolerated treatment well Patient left: in chair;with chair alarm set;with call bell/phone within reach   Time: 1349-1425 OT Time Calculation (min): 36 min Charges:  OT General Charges $OT Visit: 1 Procedure OT Evaluation $OT Eval Moderate Complexity: 1 Procedure G-Codes:    Tyris Eliot M 11-Aug-2015, 6:15 PM

## 2015-08-05 NOTE — Evaluation (Signed)
Physical Therapy Evaluation/Co session with OT Patient Details Name: [name] MRN: [id number] DOB: 1940-06-02 Today's Date: 08/05/2015   History of Present Illness  75 y.o. female admitted to [facility] on 08/01/15 due to R sided weakness, facial droop and speech difficulties.  MRI revealed L basal ganglia hemorrhagic infarct.  Pt with significant PMHx of HTN.  Clinical Impression  Pt was able to get OOB to chair with mod assist and worked on standing balance reactions with two person assist.  Pt is significantly impaired in her mobility, but already staring to use compensatory strategies without cues to do so.  She would be an excellent CIR candidate at discharge.  Family left room and pt with significant difficulty communicating, so hx is not complete and will need to be assessed later.  PT to follow acutely for deficits listed below.       Follow Up Recommendations CIR    Equipment Recommendations  Wheelchair (measurements PT);Wheelchair cushion (measurements PT);3in1 (PT)    Recommendations for Other Services Rehab consult     Precautions / Restrictions Precautions Precautions: Fall Precaution Comments: right sided weakness, impulsivity.       Mobility                    Transfers Overall transfer level: Needs assistance Equipment used: None Transfers: Sit to/from Stand Sit to Stand: +2 safety/equipment;Mod assist         General transfer comment: Two person mod assist to stand from lower recliner chair to support trunk and block right leg.  Assist needed to power up and to control descent to sit.       Modified Rankin (Stroke Patients Only) Modified Rankin (Stroke Patients Only) Pre-Morbid Rankin Score: No symptoms Modified Rankin: Severe disability     Balance Overall balance assessment: Needs assistance Sitting-balance support: Feet supported;Single extremity supported Sitting balance-Leahy Scale: Fair     Standing balance support: Single extremity  supported Standing balance-Leahy Scale: Poor Standing balance comment: Two person mod assist to stand and work on weight shifting and balance reactions in standing.  OT on weak side reports that leg doesn't feel flaccid in standing, but she doesn't seem to kick in any muscle firing in that leg to prevent LOB when allowed to go over to the right.  Pt does pull to the left with her left upper extremity to try to correct LOB to the right.                              Pertinent Vitals/Pain Pain Assessment: Faces Faces Pain Scale: No hurt    Home Living Family/patient expects to be discharged to:: Private residence Living Arrangements: Spouse/significant other                    Prior Function Level of Independence: Independent                  Extremity/Trunk Assessment   Upper Extremity Assessment: Defer to OT evaluation           Lower Extremity Assessment: RLE deficits/detail RLE Deficits / Details: right leg with some mild muscle tone, so not flaccid, however, inconsistant active movement (OT noted that toes pulled forward to donn socks in bed, but may be synergistic or reflexive as she was unable to repeat functionally later.      Cervical / Trunk Assessment: Normal  Communication   Communication: Receptive difficulties;Expressive difficulties  Cognition Arousal/Alertness: Awake/alert Behavior During Therapy: Impulsive Overall Cognitive Status: Impaired/Different from baseline Area of Impairment: Attention;Following commands;Safety/judgement;Awareness;Problem solving   Current Attention Level: Focused   Following Commands: Follows one step commands inconsistently;Follows one step commands with increased time Safety/Judgement: Decreased awareness of safety;Decreased awareness of deficits Awareness: Intellectual Problem Solving: Difficulty sequencing;Requires verbal cues;Requires tactile cues;Slow processing General Comments: Pt impulsive, OT using  thumbs up thumbs down to communicate pt inconsistant with her answers.               Assessment/Plan    PT Assessment Patient needs continued PT services  PT Diagnosis Difficulty walking;Abnormality of gait;Generalized weakness;Hemiplegia dominant side;Altered mental status   PT Problem List Decreased strength;Decreased activity tolerance;Decreased balance;Decreased mobility;Decreased cognition;Decreased coordination;Decreased knowledge of use of DME;Decreased safety awareness;Decreased knowledge of precautions;Impaired tone  PT Treatment Interventions DME instruction;Gait training;Stair training;Functional mobility training;Therapeutic activities;Therapeutic exercise;Balance training;Neuromuscular re-education;Patient/family education;Cognitive remediation   PT Goals (Current goals can be found in the Care Plan section) Acute Rehab PT Goals Patient Stated Goal: unable to state PT Goal Formulation: Patient unable to participate in goal setting Time For Goal Achievement: 08/19/15 Potential to Achieve Goals: Good    Frequency Min 4X/week    End of Session   Activity Tolerance: Patient limited by fatigue Patient left: in chair;with call bell/phone within reach;with chair alarm set Nurse Communication: Mobility status;Other (comment) (pt very impulsive)         Time: [id number] PT Time Calculation (min) (ACUTE ONLY): 18 min   Charges:   PT Evaluation $PT Eval Moderate Complexity: 1 Procedure          Ugochi Henzler B. [place], [place], DPT [phone]   08/05/2015, 2:49 PM

## 2015-08-05 NOTE — Care Management Important Message (Signed)
Important Message  Patient Details  Name: DESSA SZUCS MRN: [id number] Date of Birth: 1940/11/27   Medicare Important Message Given:  Other (see comment)    [place], Evelena Masci Abena 08/05/2015, 11:03 AM

## 2015-08-05 NOTE — Progress Notes (Signed)
Speech Language Pathology Treatment: Dysphagia;Cognitive-Linquistic  Patient Details Name: [name] MRN: [id number] DOB: Oct 05, 1940 Today's Date: 08/05/2015 Time: [id number] SLP Time Calculation (min) (ACUTE ONLY): 24 min  Assessment / Plan / Recommendation Clinical Impression  Skilled intervention provided during facilitation of safe/efficient swallowing and communication. Pt spontaneously gestured needs to SLP x 2. SLP provided maximum multimodal assist for environmental/biographical yes/no responses (tactile-moving head, yes/no written on board) with spontaneous and accurate response x 1. Increased visual/verbal cueing needed picture identification using communication board today. Mild labial/lingual approximations during automatic/familiar verbal activities. Increased response for automatic language evidenced by repetition of a two word phrase on command "bye bye" with visual cues for labial placement. Phonation accurate with decreased intensity.  Self fed cup sip (left hand)  honey thick juice with min cues for smaller sips. Immediate cough x 1 as it appeared pt attempted to initiate phonation without further instances of decreased airway protection throughout remainder of session. Trial of Dys 3 texture consumed with minimally decreased manipulation and mild right labial residue- recommend continue Dys 2 with continued use of strategies to check for pocketing prior to upgrade; continue honey thick liquids.    HPI HPI: [name] is an 75 y.o. female with a history of hypertension, brought to the emergency room in code stroke status following acute onset of right-sided weakness and speech difficulty.  She has no previous history of stroke or TIA. CT scan of her head with a large left basal ganglia hemorrhage. There is localized mass effect and      SLP Plan  Continue with current plan of care     Recommendations  Diet recommendations: Dysphagia 2 (fine chop);Honey-thick  liquid Liquids provided via: Cup Medication Administration: Whole meds with puree Supervision: Patient able to self feed;Full supervision/cueing for compensatory strategies Compensations: Minimize environmental distractions;Slow rate;Small sips/bites;Lingual sweep for clearance of pocketing Postural Changes and/or Swallow Maneuvers: Seated upright 90 degrees             General recommendations: Rehab consult Oral Care Recommendations: Oral care BID Follow up Recommendations: Inpatient Rehab Plan: Continue with current plan of care                     [place] Siren 08/05/2015, 12:04 PM   Orbie Pyo Colvin Caroli.Ed [employer] [phone]

## 2015-08-06 ENCOUNTER — Inpatient Hospital Stay (HOSPITAL_COMMUNITY): Payer: Medicare Other

## 2015-08-06 DIAGNOSIS — G935 Compression of brain: Secondary | ICD-10-CM

## 2015-08-06 DIAGNOSIS — R4182 Altered mental status, unspecified: Secondary | ICD-10-CM | POA: Diagnosis present

## 2015-08-06 DIAGNOSIS — I610 Nontraumatic intracerebral hemorrhage in hemisphere, subcortical: Principal | ICD-10-CM

## 2015-08-06 DIAGNOSIS — I69120 Aphasia following nontraumatic intracerebral hemorrhage: Secondary | ICD-10-CM

## 2015-08-06 DIAGNOSIS — G8111 Spastic hemiplegia affecting right dominant side: Secondary | ICD-10-CM

## 2015-08-06 DIAGNOSIS — G936 Cerebral edema: Secondary | ICD-10-CM

## 2015-08-06 LAB — GLUCOSE, CAPILLARY
GLUCOSE-CAPILLARY: 94 mg/dL (ref 65–99)
GLUCOSE-CAPILLARY: 100 mg/dL — AB (ref 65–99)
GLUCOSE-CAPILLARY: 97 mg/dL (ref 65–99)
Glucose-Capillary: 102 mg/dL — ABNORMAL HIGH (ref 65–99)
Glucose-Capillary: 91 mg/dL (ref 65–99)
Glucose-Capillary: 94 mg/dL (ref 65–99)

## 2015-08-06 LAB — CBC WITH DIFFERENTIAL/PLATELET
BASOS ABS: 0 10*3/uL (ref 0.0–0.1)
Basophils Relative: 0 %
EOS ABS: 0.4 10*3/uL (ref 0.0–0.7)
Eosinophils Relative: 5 %
HCT: 35.8 % — ABNORMAL LOW (ref 36.0–46.0)
HEMOGLOBIN: 12.1 g/dL (ref 12.0–15.0)
LYMPHS ABS: 1.4 10*3/uL (ref 0.7–4.0)
LYMPHS PCT: 18 %
MCH: 30.2 pg (ref 26.0–34.0)
MCHC: 33.8 g/dL (ref 30.0–36.0)
MCV: 89.3 fL (ref 78.0–100.0)
Monocytes Absolute: 1 10*3/uL (ref 0.1–1.0)
Monocytes Relative: 12 %
NEUTROS PCT: 65 %
Neutro Abs: 5.1 10*3/uL (ref 1.7–7.7)
PLATELETS: 271 10*3/uL (ref 150–400)
RBC: 4.01 MIL/uL (ref 3.87–5.11)
RDW: 14.2 % (ref 11.5–15.5)
WBC: 8 10*3/uL (ref 4.0–10.5)

## 2015-08-06 MED ORDER — PANTOPRAZOLE SODIUM 40 MG PO TBEC
40.0000 mg | DELAYED_RELEASE_TABLET | Freq: Every day | ORAL | Status: DC
  Administered 2015-08-07 – 2015-08-10 (×2): 40 mg via ORAL
  Filled 2015-08-06 (×3): qty 1

## 2015-08-06 MED ORDER — RISAQUAD PO CAPS
1.0000 | ORAL_CAPSULE | Freq: Two times a day (BID) | ORAL | Status: DC
  Administered 2015-08-07: 1 via ORAL
  Filled 2015-08-06 (×7): qty 1

## 2015-08-06 MED ORDER — CIPROFLOXACIN HCL 500 MG PO TABS
500.0000 mg | ORAL_TABLET | Freq: Two times a day (BID) | ORAL | Status: DC
  Administered 2015-08-07 – 2015-08-08 (×2): 500 mg via ORAL
  Filled 2015-08-06 (×4): qty 1

## 2015-08-06 NOTE — Progress Notes (Signed)
Rehab Admissions Coordinator Note:  Patient was screened by Cleatrice Burke for appropriateness for an Inpatient Acute Rehab Consult per PT and OT recommendation. At this time, we are recommending Inpatient Rehab consult.  Cleatrice Burke 08/06/2015, 7:25 AM  I can be reached at [phone].

## 2015-08-06 NOTE — Progress Notes (Signed)
EEG completed; results pending.    

## 2015-08-06 NOTE — Progress Notes (Signed)
Inpatient Rehabilitation  Met with patient and spouse at bedside to discuss team's recommendation of IP Rehab post acute care stay.  I shared booklets with and answered spouse's questions.  Spouse informed me that son has POA, but that they are interested in the program.  Will continue to follow along of readiness/tolerance of therapy.    Carmelia Roller., CCC/SLP Admission Coordinator  [place]  Cell [phone]

## 2015-08-06 NOTE — Progress Notes (Signed)
Physical Therapy Treatment Patient Details Name: [name] MRN: [id number] DOB: [birth year] Today's Date: 08/06/2015    History of Present Illness 75 y.o. female admitted to [facility] on 08/01/15 due to R sided weakness, facial droop and speech difficulties.  MRI revealed L basal ganglia hemorrhagic infarct.  Pt with significant PMHx of HTN.    PT Comments    Pt seems more withdrawn today.  She is not attempting to do thumbs up and down to answer questions like she was yesterday.  She is minimally engaged, but did still attempt to stand and move to bed with me.  Less initiation.  Husband seems to think she is processing her current situation emotionally, but difficult to know since she has such significant communication deficits.  PT will continue to follow acutely.   Follow Up Recommendations  CIR     Equipment Recommendations  Wheelchair (measurements PT);Wheelchair cushion (measurements PT);3in1 (PT)    Recommendations for Other Services   NA     Precautions / Restrictions Precautions Precautions: Fall Precaution Comments: right sided weakness, impulsivity.     Mobility  Bed Mobility Overal bed mobility: Needs Assistance Bed Mobility: Supine to Sit     Supine to sit: Total assist     General bed mobility comments: Pt did not initiate helping to move EOB at all.   Transfers Overall transfer level: Needs assistance Equipment used: None Transfers: Squat Pivot Transfers     Squat pivot transfers: Max assist;From elevated surface     General transfer comment: Max assist to support pt's trunk and help control right knee to prevent buckling.  Pt was able to reach her left arm as we transferred to the left to chair armrest.        Modified Rankin (Stroke Patients Only) Modified Rankin (Stroke Patients Only) Pre-Morbid Rankin Score: No symptoms Modified Rankin: Severe disability     Balance Overall balance assessment: Needs assistance Sitting-balance support: Feet  supported Sitting balance-Leahy Scale: Poor Sitting balance - Comments: Pt needed max assist EOB to remain sitting, eyes closed.  PT positioned left arm to prop in sitting to support herself.     Standing balance support: Single extremity supported Standing balance-Leahy Scale: Zero Standing balance comment: Max assist to squat pivot today.                      Cognition Arousal/Alertness: Lethargic Behavior During Therapy: Flat affect Overall Cognitive Status: Difficult to assess Area of Impairment: Attention;Following commands;Safety/judgement;Awareness;Problem solving   Current Attention Level: Focused   Following Commands: Follows one step commands inconsistently Safety/Judgement: Decreased awareness of safety;Decreased awareness of deficits Awareness: Intellectual Problem Solving: Decreased initiation;Difficulty sequencing;Requires verbal cues;Requires tactile cues General Comments: Pt not actively engaging in session today.  She did not do thumbs up or down like she did yesterday.         General Comments General comments (skin integrity, edema, etc.): Husband present for transfer and reporting that he thinks she may be shutting down mentally as she is mentally processing her dx.      Pertinent Vitals/Pain Pain Assessment: Faces Pain Score: 0-No pain           PT Goals (current goals can now be found in the care plan section) Acute Rehab PT Goals Patient Stated Goal: family would like for her to go to CIR.  Progress towards PT goals: Not progressing toward goals - comment (pt not as engaged today)    Frequency  Min 4X/week  PT Plan Current plan remains appropriate       End of Session Equipment Utilized During Treatment: Gait belt Activity Tolerance: Patient limited by lethargy;Patient limited by fatigue Patient left: in chair;with call bell/phone within reach;with chair alarm set;with family/visitor present     Time: [id number] PT Time  Calculation (min) (ACUTE ONLY): 36 min  Charges:  $Therapeutic Activity: 23-37 mins                      Briani Maul B. [place], [place], DPT [phone]   08/06/2015, 2:40 PM

## 2015-08-06 NOTE — Progress Notes (Signed)
STROKE TEAM PROGRESS NOTE   SUBJECTIVE (INTERVAL HISTORY) Patient  Is much more sleepy today. She remains aphasic but is falling only occasional commands. She remains plegic on the right. Blood pressure has been adequately controlled.   OBJECTIVE Temp:  [97.4 F (36.3 C)-98.1 F (36.7 C)] 97.4 F (36.3 C) (04/19 1227) Pulse Rate:  [51-75] 69 (04/19 1300) Cardiac Rhythm:  [-] Normal sinus rhythm (04/19 0800) Resp:  [10-18] 16 (04/19 1300) BP: (143-191)/(66-100) 159/75 mmHg (04/19 1300) SpO2:  [94 %-100 %] 99 % (04/19 1300)  CBC:   Recent Labs Lab 08/01/15 2001  08/02/15 0819 08/03/15 0311  WBC 9.6  --  10.2 10.6*  NEUTROABS 4.5  --   --   --   HGB 12.6  < > 12.3 11.6*  HCT 37.3  < > 36.7 35.1*  MCV 90.1  --  89.7 90.0  PLT 290  --  262 212  < > = values in this interval not displayed.  Basic Metabolic Panel:   Recent Labs Lab 08/02/15 0819 08/03/15 0311  08/05/15 0320 08/05/15 1118  NA 141 141  < > 140 141  K 3.6 3.7  --   --   --   CL 107 111  --   --   --   CO2 22 17*  --   --   --   GLUCOSE 103* 128*  --   --   --   BUN 11 13  --   --   --   CREATININE 0.77 0.71  --   --   --   CALCIUM 9.0 8.9  --   --   --   < > = values in this interval not displayed.  Lipid Panel: No results found for: CHOL, TRIG, HDL, CHOLHDL, VLDL, LDLCALC HgbA1c: No results found for: HGBA1C Urine Drug Screen: No results found for: LABOPIA, COCAINSCRNUR, LABBENZ, AMPHETMU, THCU, LABBARB    IMAGING  Ct Head Wo Contrast  08/03/2015 1. Increasing size of left basal ganglia hemorrhage since last CT (but same as on MRI), now measuring 5.1 x 3.5 x 4.5 cm. 2. Increasing surrounding vasogenic edema and mass effect with progressive effacement of the left lateral ventricle and midline shift now measuring 3.5 mm. 3. Diffuse sinus disease.  08/01/2015   1. Left basal ganglia hemorrhagic infarct.  2. Mild chronic small vessel white matter ischemic changes in both cerebral hemispheres.  3.  Chronic bilateral frontal, bilateral ethmoid and left maxillary sinusitis.  4. Acute right maxillary sinusitis.   Chest Port 1 View 08/01/2015   1. No acute findings.  2. Accentuation of the mediastinal contours may be due to AP technique and low lung volumes.   MRI/MRA head without contrast 08/03/2015 1. No vascular abnormality or aneurysm underlying the left basal ganglia hemorrhage identified. 2. No large or proximal arterial branch occlusion. Short-segment stenoses at the proximal left M1 and mid right M1 segments. 3. 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  MRI with and without contrast  08/02/2015 1. Increased size of acute intraparenchymal hemorrhage centered at the left basal ganglia, now measuring 5.6 x 3.3 x 3.6 cm (estimated volume 33 mL). Associated localized vasogenic edema and mass effect with up to 6 mm of left-to-right shift. Intraventricular extension with small volume blood layering in the occipital horns of both lateral ventricles. No hydrocephalus or evidence of ventricular trapping. 2.  No other acute intracranial abnormality. 3. Mild chronic small vessel ischemic disease.  Portable abdominal x-ray  08/02/2015 The tip of the feeding tube overlies the distal descending duodenum.   PHYSICAL EXAM Neurologic Examination: Mental Status: Awake alert and interactive The patient does  attempt to speak but had severe expressive aphasia.. She follows only some simple midline commands. Cranial Nerves: Does not blink to threat on the right but does so on the left. III/IV/VI-Pupils were equal and reacted normally to light. Extraocular movements were full and conjugate but slight left gaze preference.  VII-right lower facial weakness facial weakness. VIII-normal. X-no speech output. Motor: Right hemiplegia with 0/5 strength.  Minimum withdrawal to painful stimuli. normal strength and tone in the left extremities. Sensory: Withdraws right extremities to painful stimulation. Deep Tendon Reflexes: 2+, asymmetric with increased responses elicited from right extremities. Plantars: Extensor on the right and flexor on the left Gait - not tested   ASSESSMENT/PLAN Ms. DAVY QUADRI is a 75 y.o. female with history hypertension and hyperlipidemia presenting with right hemiparesis and speech difficulties.  She did not receive IV t-PA due to large left basal ganglia hemorrhage.  Hemorrhagic Stroke:  Dominant L basal ganglia hemorrhage with cytotoxic cerebral edema and midline shift, hemorrhage probably secondary to hypertension.  Resultant    MRI - as above - Increased size of acute intraparenchymal hemorrhage   MRA - as above - No vascular abnormality or aneurysm underlying the left basal ganglia hemorrhage identified.  Repeat CT this am no significant change in hemorrhage size since yesterday, 4.5 mm shift, edema, mass effect, no hydrocephalus   2D Echo - EF 60-65%, mild AS - performed January 2017  VTE prophylaxis - SCDs DIET DYS 2 Room service appropriate?: Yes; Fluid consistency:: Honey Thick. Tube remains out this am. For ST assessment this am. If fails, replace tube (expect she will need tube)  No antithrombotic prior to admission, now on No antithrombotic secondary to hemorrhage  No indication for ongoing decadron. Will discontinue  Ongoing aggressive stroke risk factor management  Dr. Leonie Man discussed with patient's husband over the phone - will start 3% and place central line - CCM to place line  Therapy recommendations: pending   Disposition:  pending   Hypertensive emergency  BP as high as 193/90 & 120/106 in setting of neurologic symptoms  160s this am  Received several doses of  PRN antihypertensives yesterday  Resume Cozaar 100 mg daily once tube placed  Hyperlipidemia  Home meds:  Lipitor 20 mg daily   Continue statin at discharge  Other Stroke Risk Factors  Advanced age  ETOH use  Other Active Problems  UTI - Cipro ( changed to IV until PO access is again available )  Agitation - PRN Seroquel  Abnormal chest x-ray - PA and lateral views recommended at some point.  Blood pressure - systolic blood pressure goal less than 160 per Dr Irish Elders. PO cozaar on hold.  Hospital day # 5   I have personally examined this patient, reviewed notes, independently viewed imaging studies, participated in medical decision making and plan of care. I have made any additions or clarifications directly to the above note. Agree with note above. The patient has a moderate-sized intracerebral hemorrhage with mild cytotoxic edema and midline shift. She remains at risk for neurological worsening, developing hydrocephalus, death and needs close neurological monitoring and aggressive treatment for cerebral edema. Continue close neurological observation and strict blood pressure control. We will hold off on hypertonic saline has she  has shown clinical improvement and there is radiological stability of her cerebral edema.   D/w son at bedside  repeat head CT today shows no change in edema but she appears more sleepy. Will check EEG for seizures, CBC and chest xray.Doubt aspiration pneumonia given no fever and good oxygen sats and no resp distress.Will Dc cipro tomorrow This patient is critically ill and at significant risk of neurological worsening, death and care requires constant monitoring of vital signs, hemodynamics,respiratory and cardiac monitoring, extensive review of multiple databases, frequent neurological assessment, discussion with family, other specialists and medical decision making of high complexity.I have made any additions or clarifications directly to the above note.This critical care time does not reflect procedure time, or teaching time or supervisory time of PA/NP/Med  Resident etc but could involve care discussion time.  I spent 40 minutes of neurocritical care time  in the care of  this patient.     Antony Contras, MD Medical Director [facility] Stroke Center Pager: [phone] 08/06/2015 2:02 PM  To contact Stroke Continuity provider, please refer to [email or web address] After hours, contact General Neurology

## 2015-08-06 NOTE — Procedures (Signed)
ELECTROENCEPHALOGRAM REPORT  Date of Study: 08/06/2015  Patient's Name: [name] MRN: [id number] Date of Birth: 1940/07/17  Referring Provider: Dr. Antony Contras  Clinical History: This is a 75 year old woman with right-sided weakness, aphasia, and left gaze deviation.  Medications: acetaminophen (TYLENOL) tablet 650 mg atorvastatin (LIPITOR) tablet 20 mg ciprofloxacin (CIPRO) tablet 500 mg estrogens (conjugated) (PREMARIN) tablet 0.3 mg fluticasone (FLONASE) 50 MCG/ACT nasal spray 1 spray hydrALAZINE (APRESOLINE) injection 10 mg HYDROcodone-acetaminophen (NORCO/VICODIN) 5-325 MG per tablet 1 tablet labetalol (NORMODYNE,TRANDATE) injection 10-40 mg latanoprost (XALATAN) 0.005 % ophthalmic solution 1 drop loratadine (CLARITIN) tablet 10 mg losartan (COZAAR) tablet 100 mg olopatadine (PATANOL) 0.1 % ophthalmic solution 1 drop pantoprazole (PROTONIX) EC tablet 40 mg QUEtiapine (SEROQUEL) tablet 25 mg  Technical Summary: A multichannel digital EEG recording measured by the international 10-20 system with electrodes applied with paste and impedances below 5000 ohms performed in our laboratory with EKG monitoring in an awake and drowsy patient.  Hyperventilation and photic stimulation were not performed.  The digital EEG was referentially recorded, reformatted, and digitally filtered in a variety of bipolar and referential montages for optimal display.    Description: The patient is awake and drowsy during the recording.  During maximal wakefulness, there is an asymmetric, medium voltage 9 Hz posterior dominant rhythm better formed over the right occipital region. There is near-continuous focal polymorphic theta and delta slowing over the left hemisphere, maximal over the left temporal region. During drowsiness, there is an increase in theta and delta slowing of the background.  Deeper stages of sleep were not seen.  Hyperventilation and photic stimulation were not performed.  There  were no epileptiform discharges or electrographic seizures seen.    EKG lead showed sinus bradycardia.  Impression: This awake and drowsy EEG is abnormal due to focal slowing over the left hemisphere.  Clinical Correlation of the above findings indicates focal cerebral dysfunction over the left hemisphere suggestive of underlying structural or physiologic abnormality in this region. The absence of epileptiform discharges does not exclude a clinical diagnosis of epilepsy. Clinical correlation is advised.   Ellouise Newer, M.D.

## 2015-08-06 NOTE — Consult Note (Signed)
   [facility] CM Inpatient Consult   08/06/2015  EMMARY HECKLE Nov 24, 1940 [id number] Patient screened for potential [facility] Management services. Patient is eligible for [facility] Care Management services under patient's Medicare  plan.  Patient/family currently seeking inpatient rehab.  Will continue to follow for progress and disposition needs as appropriate.   Please place a [facility] Care Management consult, if care management needs are identified or for questions contact:   Natividad Brood, RN BSN [facility] Liaison  [phone] business mobile phone Toll free office [phone]

## 2015-08-06 NOTE — Progress Notes (Signed)
Nutrition Follow-up  INTERVENTION:   Magic cup TID between meals, each supplement provides 290 kcal and 9 grams of protein  If pt unable to eat recommend Cortrak placement and initiate tube feeding. Initiate Vital AF 1.5 @ 20 ml/hr via NGT and increase by 10 ml every 4 hours to goal rate of 45 ml/hr.   30 ml Prostat daily  Tube feeding regimen provides 1720 kcal (100% of needs), 88 grams of protein, and 825 ml of H2O.   NUTRITION DIAGNOSIS:   Inadequate oral intake related to lethargy/confusion as evidenced by meal completion < 25%. Ongoing.   GOAL:   Patient will meet greater than or equal to 90% of their needs Not met.   MONITOR:   PO intake, I & O's, Supplement acceptance  ASSESSMENT:   75 y/o female PMHx HTN, IBS presented to ED w/ acute onset of right sided weakness and speech difficulty. CT of head shows large left basal ganglia hemorrhage  4/15 Cortrak placed started on TF 4/16 pt pulled Cortrak out 4/17 Dysphagia 2 with Honey thick diet started PO intake minimal. Did not eat Breakfast, did not try to communicate with me today seemed very sleepy. Son at bedside.   Diet Order:  DIET DYS 2 Room service appropriate?: Yes; Fluid consistency:: Honey Thick  Skin:  Reviewed, no issues  Last BM:  unknown  Height:   Ht Readings from Last 1 Encounters:  08/01/15 _0  (1.626 m)    Weight:   Wt Readings from Last 1 Encounters:  08/05/15 142 lb 3.2 oz (64.5 kg)    Ideal Body Weight:  54.55 kg  BMI:  Body mass index is 24.4 kg/(m^2).  Estimated Nutritional Needs:   Kcal:  1650-1850 (26-29 kcal/kg bw)  Protein:  83-96 g (1.3-1.5 g/kg bw)  Fluid:  1.6-1.8 liters fluid  EDUCATION NEEDS:   No education needs identified at this time  [place], [place], [place] Pager [phone] After Hours Pager

## 2015-08-06 NOTE — Consult Note (Signed)
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 Leonie Man   HPI: [name] is a 75 y.o. right handed female with history of hypertension. Presented 08/01/2015 with acute onset of aphasia and right-sided weakness. Patient lives with spouse independent prior to admission. Husband is a retired Field se[employer]ologist. Blood pressure 120/106. CT of the head and imaging revealed large left basal ganglia hemorrhage with no extension into the left lateral ventricle. MRI of the brain showing increased size of acute intraparenchymal hemorrhage centered at the left basal ganglion measuring 5.6 x 3.3 x 3.6 cm. Associated localized vasogenic edema and mass effect. No hydrocephalus. MRA of the head with no vascular abnormality or aneurysm underlying the left basal ganglia hemorrhage. No larger proximal arterial branch occlusion. Neurology follow-up conservative care and close monitoring of blood pressure.  Follow-up CT of the head 08/05/2015 unchanged. Currently on a dysphagia #2 honey thick liquid diet after modified barium swallow. Suspect UTI currently maintained on Cipro. Bouts of agitation with Seroquel added. Physical occupational therapy evaluations completed with recommendations of physical medicine rehabilitation consult.  Spoke to OT patient was more responsive yesterday was able to do thumbs-up or thumbs down. Was pushing with the left lower extremity. This morning has been pushing away food refusing pills  Review of Systems  Unable to perform ROS: language   Past Medical History  Diagnosis Date  . Bronchitis, not specified as acute or chronic   . Other chronic sinusitis   . Other chronic allergic conjunctivitis   . Allergic rhinitis, cause unspecified   . 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 Acute renal insufficiency     due to amphotericin 2012  . Irritable bowel   .  Hypertension   . Diverticulosis    Past Surgical History  Procedure Laterality Date  . Cesarean section      x 4  . Vesicovaginal fistula closure w/ tah    . Breast lumpectomy      Right-benign  . Tonsillectomy    . Appendectomy    . Bronchoscopy      Video bronch w/ TBBX 2012   Family History  Problem Relation Age of Onset  . Aortic aneurysm Father   . Heart failure Mother    Social History:  reports that she has never smoked. She has never used smokeless tobacco. She reports that she drinks alcohol. Her drug history is not on file. Allergies:  Allergies  Allergen Reactions  . Penicillins Hives and Rash    Has patient had a PCN reaction causing immediate rash, facial/tongue/throat swelling, SOB or lightheadedness with hypotension: Yes Has patient had a PCN reaction causing severe rash involving mucus membranes or skin necrosis: No Has patient had a PCN reaction that required hospitalization unknown Has patient had a PCN reaction occurring within the last 10 years: No If all of the above answers are "NO", then may proceed with Cephalosporin use.   Medications Prior to Admission  Medication Sig Dispense Refill  . atorvastatin (LIPITOR) 20 MG tablet Take 20 mg by mouth at bedtime.   3  . bimatoprost (LUMIGAN) 0.01 % SOLN Place 1 drop into both eyes at bedtime.    . cetirizine (ZYRTEC) 10 MG tablet Take 10 mg by mouth daily as needed (seasonal allergies).    Marland Kitchen estrogens, conjugated, (PREMARIN) 0.3 MG tablet Take 0.3 mg by mouth every other day.     Marland Kitchen  fluticasone (FLONASE) 50 MCG/ACT nasal spray Place 1 spray into both nostrils daily as needed (seasonal allergies).    . losartan (COZAAR) 100 MG tablet Take 100 mg by mouth at bedtime.   3  . Olopatadine HCl (PATADAY) 0.2 % SOLN Place 1 drop into both eyes every 12 (twelve) hours as needed (seasonal allergies).    Vladimir Faster Glycol-Propyl Glycol (SYSTANE OP) Place 1 drop into both eyes daily as needed (dry eyes).    . Probiotic  Product (ALIGN) 4 MG CAPS Take 4 mg by mouth 2 (two) times daily.    Marland Kitchen HYDROcodone-acetaminophen (NORCO) 5-325 MG per tablet Take 1 tablet by mouth every 4 (four) hours as needed for pain. (Patient not taking: Reported on 08/02/2015) 20 tablet 0  . ondansetron (ZOFRAN) 8 MG tablet Take 1 tablet (8 mg total) by mouth every 8 (eight) hours as needed for nausea. (Patient not taking: Reported on 08/02/2015) 16 tablet 0    Home: Home Living Family/patient expects to be discharged to:: Private residence Living Arrangements: Spouse/significant other Available Help at Discharge: Family, Available 24 hours/day Type of Home: House Home Access: Stairs to enter [employer] of Steps: 1 + 1 Home Layout: Two level, 1/2 bath on main level Alternate Level Stairs-Number of Steps: full flight  Bathroom Toilet: Standard Additional Comments: .  Family can hire assist if needed.  house has the capability of having an elevator installed - sons are contractors and can make the renovations   Lives With: Spouse  Functional History: Prior Function Level of Independence: Independent Functional Status:  Mobility: Bed Mobility Overal bed mobility: Needs Assistance Bed Mobility: Supine to Sit Supine to sit: Min assist General bed mobility comments: Pt hooked Lt foot under right.  She requires min A to move LEs off bed and to lift trunk  Transfers Overall transfer level: Needs assistance Equipment used: 1 person hand held assist, 2 person hand held assist Transfers: Sit to/from Stand, Squat Pivot Transfers Sit to Stand: +2 safety/equipment, Mod assist Squat pivot transfers: Mod assist General transfer comment: Two person mod assist to stand from lower recliner chair to support trunk and block right leg.  Assist needed to power up and to control descent to sit.       ADL: ADL Overall ADL's : Needs assistance/impaired Eating/Feeding: Minimal assistance, Bed level Grooming: Wash/dry hands, Wash/dry  face, Minimal assistance, Sitting Upper Body Bathing: Minimal assitance, Sitting Lower Body Bathing: Moderate assistance, Sit to/from stand Upper Body Dressing : Maximal assistance, Sitting Lower Body Dressing: Sit to/from stand, Maximal assistance Lower Body Dressing Details (indicate cue type and reason): Pt able to don socks with min A and doff with min guard assist  Toilet Transfer: Moderate assistance, Squat-pivot, BSC Toileting- Clothing Manipulation and Hygiene: Maximal assistance, Sit to/from stand Functional mobility during ADLs: Moderate assistance General ADL Comments: Pt appears very motivated   Cognition: Cognition Overall Cognitive Status: Difficult to assess Arousal/Alertness: Lethargic Orientation Level: Other (comment) (mute) Attention: Focused, Sustained Focused Attention: Appears intact (intermittently in tact) Sustained Attention: Impaired Sustained Attention Impairment: Verbal basic, Functional basic (able to sustain attention to basic tasks for 10-20 seconds) Problem Solving:  (demosntrates some basic functional problem solving) Cognition Arousal/Alertness: Awake/alert Behavior During Therapy: Impulsive Overall Cognitive Status: Difficult to assess Area of Impairment: Attention, Following commands, Safety/judgement, Awareness, Problem solving Current Attention Level: Sustained Following Commands: Follows one step commands inconsistently, Follows one step commands with increased time Safety/Judgement: Decreased awareness of safety, Decreased awareness of deficits Awareness: Intellectual Problem  Solving: Difficulty sequencing, Requires verbal cues, Requires tactile cues, Slow processing General Comments: Pt follows commands with multi modal cuing inconsitently.  She is impulsive.  Difficult to accurately assess cognition due to severity of language deficits  Difficult to assess due to: Impaired communication  Blood pressure 154/92, pulse 53, temperature 97.5 F  (36.4 C), temperature source Oral, resp. rate 14, height 5\' 4"  (1.626 m), weight 64.5 kg (142 lb 3.2 oz), SpO2 97 %. Physical Exam  Constitutional: She appears well-developed.  HENT:  Head: Normocephalic.  Eyes:  Pupils reactive to light  Neck: Normal range of motion. Neck supple. No thyromegaly present.  Cardiovascular: Normal rate and regular rhythm.   Respiratory: Effort normal and breath sounds normal. No respiratory distress.  GI: Soft. Bowel sounds are normal. She exhibits no distension.  Neurological:  Lethargic but arousable. Patient is lying on her right side. She remained nonverbal throughout exam. Inconsistent to follow commands both verbal and demonstrated. Patient appears to have a left gaze preference  Skin: Skin is warm and dry.  Patient has antigravity movement with left upper extremity as well as left lower extremity but could not participate with manual muscle testing. Right upper extremity and right lower extremity did not have any active movement. She did withdraw to pinch in both limbs. Positive Babinski on the right side. Increased flexor tone in the right upper extremity  Results for orders placed or performed during the hospital encounter of 08/01/15 (from the past 24 hour(s))  Glucose, capillary     Status: None   Collection Time: 08/05/15  8:50 AM  Result Value Ref Range   Glucose-Capillary 85 65 - 99 mg/dL   Comment 1 Notify RN    Comment 2 Document in Chart   Sodium     Status: None   Collection Time: 08/05/15 11:18 AM  Result Value Ref Range   Sodium 141 135 - 145 mmol/L  Glucose, capillary     Status: None   Collection Time: 08/05/15 12:14 PM  Result Value Ref Range   Glucose-Capillary 99 65 - 99 mg/dL   Comment 1 Notify RN    Comment 2 Document in Chart   Glucose, capillary     Status: None   Collection Time: 08/05/15  4:19 PM  Result Value Ref Range   Glucose-Capillary 86 65 - 99 mg/dL   Comment 1 Notify RN    Comment 2 Document in Chart     Glucose, capillary     Status: None   Collection Time: 08/05/15  8:00 PM  Result Value Ref Range   Glucose-Capillary 98 65 - 99 mg/dL  Glucose, capillary     Status: None   Collection Time: 08/05/15 11:36 PM  Result Value Ref Range   Glucose-Capillary 92 65 - 99 mg/dL  Glucose, capillary     Status: None   Collection Time: 08/06/15  3:44 AM  Result Value Ref Range   Glucose-Capillary 94 65 - 99 mg/dL   Ct Head Wo Contrast  08/05/2015  CLINICAL DATA:  Intracranial hemorrhage followup EXAM: CT HEAD WITHOUT CONTRAST TECHNIQUE: Contiguous axial images were obtained from the base of the skull through the vertex without intravenous contrast. COMPARISON:  08/03/2015 FINDINGS: Left basal ganglia hemorrhage unchanged measuring 49 x 27 mm. Surrounding low-density also unchanged. No new area of hemorrhage Mass-effect on the left lateral ventricle unchanged. Mild midline shift to the right unchanged. Mild atrophy. Mucosal edema in the paranasal sinuses. IMPRESSION: Left basal ganglia hematoma unchanged. There is mass-effect  on the ventricular system with mild midline shift unchanged. No new area of hemorrhage or infarction. Electronically Signed   By: Franchot Gallo M.D.   On: 08/05/2015 10:24   Dg Swallowing Func-speech Pathology  08/04/2015  Objective Swallowing Evaluation: Type of Study: MBS-Modified Barium Swallow Study Patient Details Name: [name] MRN: [id number] Date of Birth: 04-18-1941 Today's Date: 08/04/2015 Time: SLP Start Time (ACUTE ONLY): 1341-SLP Stop Time (ACUTE ONLY): 1357 SLP Time Calculation (min) (ACUTE ONLY): 16 min Past Medical History: Past Medical History Diagnosis Date . Bronchitis, not specified as acute or chronic  . Other chronic sinusitis  . Other chronic allergic conjunctivitis  . Allergic rhinitis, cause unspecified  . 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 Acute renal insufficiency    due to amphotericin 2012 . Irritable bowel  . Hypertension  . Diverticulosis   Past Surgical History: Past Surgical History Procedure Laterality Date . Cesarean section     x 4 . Vesicovaginal fistula closure w/ tah   . Breast lumpectomy     Right-benign . Tonsillectomy   . Appendectomy   . Bronchoscopy     Video bronch w/ TBBX 2012 HPI: FIZZA JURADO is an 75 y.o. female with a history of hypertension, brought to the emergency room in code stroke status following acute onset of right-sided weakness and speech difficulty.  She has no previous history of stroke or TIA. CT scan of her head with a large left basal ganglia hemorrhage. There is localized mass effect and No Data Recorded Assessment / Plan / Recommendation CHL IP CLINICAL IMPRESSIONS 08/04/2015 Therapy Diagnosis Mild pharyngeal phase dysphagia;Moderate pharyngeal phase dysphagia;Mild oral phase dysphagia Clinical Impression MBS complete. Pt exhibited mild-mod pharyngeal phase dysphagia characterized by sensory deficits. Delayed swallow initiation to the vallecula observed across consistencies, with silent penetration to the vocal cords during intake of nectar thick liquids. Due to language deficits/apraxia, max verbal and visual cues were ineffective to elicit cough to clear penetrates. Mild right sided labial residue noted without pt awareness which may result in pocketing due to weakness/apraxia. Pt educated re: results of MBS and diet recommendation of honey thick liquids and Dysphagia 2 (fine chop) textures, with meds crushed in puree. Scan of esophagus noted stasis in mid esophagus (SLP cannot diagnose impairments below the UES), pt may benefit from esophageal workup. Recommend use of esophageal precautions (sit upright during intake, remain upright at least 30 minutes after intake, alternate solids and liquids). SLP will continue to follow to provide dysphagia tx. Impact on safety and function Moderate aspiration risk   CHL IP TREATMENT RECOMMENDATION 08/04/2015 Treatment Recommendations Therapy as outlined in treatment plan  below   Prognosis 08/04/2015 Prognosis for Safe Diet Advancement Good Barriers to Reach Goals Language deficits Barriers/Prognosis Comment -- CHL IP DIET RECOMMENDATION 08/04/2015 SLP Diet Recommendations Dysphagia 2 (Fine chop) solids;Honey thick liquids Liquid Administration via Cup;No straw Medication Administration Crushed with puree Compensations Minimize environmental distractions;Slow rate;Small sips/bites;Other (Comment);Follow solids with liquid Postural Changes Seated upright at 90 degrees;Remain semi-upright after after feeds/meals (Comment)   CHL IP OTHER RECOMMENDATIONS 08/04/2015 Recommended Consults Consider esophageal assessment Oral Care Recommendations Oral care BID Other Recommendations Order thickener from pharmacy;Prohibited food (jello, ice cream, thin soups);Remove water pitcher;Have oral suction available   CHL IP FOLLOW UP RECOMMENDATIONS 08/04/2015 Follow up Recommendations Inpatient Rehab   CHL IP FREQUENCY AND DURATION 08/04/2015 Speech Therapy Frequency (ACUTE ONLY) min 2x/week Treatment Duration 2 weeks      CHL IP ORAL PHASE 08/04/2015  Oral Phase Impaired Oral - Pudding Teaspoon -- Oral - Pudding Cup -- Oral - Honey Teaspoon -- Oral - Honey Cup (No Data) Oral - Nectar Teaspoon -- Oral - Nectar Cup (No Data) Oral - Nectar Straw -- Oral - Thin Teaspoon -- Oral - Thin Cup -- Oral - Thin Straw -- Oral - Puree -- Oral - Mech Soft (No Data) Oral - Regular -- Oral - Multi-Consistency -- Oral - Pill -- Oral Phase - Comment --  CHL IP PHARYNGEAL PHASE 08/04/2015 Pharyngeal Phase Impaired Pharyngeal- Pudding Teaspoon -- Pharyngeal -- Pharyngeal- Pudding Cup -- Pharyngeal -- Pharyngeal- Honey Teaspoon -- Pharyngeal -- Pharyngeal- Honey Cup Pharyngeal residue - valleculae;Reduced tongue base retraction;Delayed swallow initiation-vallecula Pharyngeal -- Pharyngeal- Nectar Teaspoon -- Pharyngeal -- Pharyngeal- Nectar Cup Delayed swallow initiation-vallecula;Reduced tongue base retraction;Pharyngeal residue  - valleculae;Penetration/Aspiration during swallow Pharyngeal Material enters airway, CONTACTS cords and not ejected out;Material enters airway, remains ABOVE vocal cords and not ejected out Pharyngeal- Nectar Straw -- Pharyngeal -- Pharyngeal- Thin Teaspoon -- Pharyngeal -- Pharyngeal- Thin Cup -- Pharyngeal -- Pharyngeal- Thin Straw -- Pharyngeal -- Pharyngeal- Puree Delayed swallow initiation-vallecula Pharyngeal -- Pharyngeal- Mechanical Soft Delayed swallow initiation-vallecula Pharyngeal -- Pharyngeal- Regular -- Pharyngeal -- Pharyngeal- Multi-consistency -- Pharyngeal -- Pharyngeal- Pill -- Pharyngeal -- Pharyngeal Comment --  No flowsheet data found. No flowsheet data found. 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 Does the need for close, 24 hr/day medical supervision in concert with the patient's rehab needs make it unreasonable for this patient to be served in a less intensive setting? Yes 2. Co-Morbidities requiring supervision/potential complications: Hypertension, at risk for aspiration pneumonia 3. Due to bladder management, bowel management, safety, skin/wound care, disease management, medication administration, pain management and patient education, does the patient require 24 hr/day rehab nursing? Yes 4. Does the patient require coordinated care of a physician, rehab nurse, PT (1-2 hrs/day, 5 days/week), OT (1-2 hrs/day, 5 days/week) and SLP (.5-1 hrs/day, 5 days/week) to address physical and functional deficits in the context of the above medical diagnosis(es)? Yes Addressing deficits in the following areas: balance, endurance, locomotion, strength, transferring, bowel/bladder control, bathing, dressing, feeding, grooming, toileting, cognition, speech, language,  swallowing and psychosocial support 5. Can the patient actively participate in an intensive therapy program of at least 3 hrs of therapy per day at least 5 days per week? No 6. The potential for patient to make measurable gains while on inpatient rehab is currently he is poor but should improve as cerebral edema resolves 7. Anticipated functional outcomes upon discharge from inpatient rehab are min assist  with PT, min assist with OT, min assist with SLP. 8. Estimated rehab length of stay to reach the above functional goals is: 3-4 weeks 9. Does the patient have adequate social supports and living environment to accommodate these discharge functional goals? Potentially 10. Anticipated D/C setting: Home 11. Anticipated post D/C treatments: [place] therapy 12. Overall Rehab/Functional Prognosis: currently poor but expect  fair to good improvements with time  RECOMMENDATIONS: This patient's condition is appropriate for continued rehabilitative care in the following setting: CIR once patient is able to participate with PT and OT Patient has agreed to participate in recommended program. N/A Note that insurance prior authorization may be required for reimbursement for recommended care.  Comment: Currently  unable to anticipate with PT and OT    08/06/2015

## 2015-08-07 LAB — CBC WITH DIFFERENTIAL/PLATELET
Basophils Absolute: 0 10*3/uL (ref 0.0–0.1)
Basophils Relative: 0 %
Eosinophils Absolute: 0.6 10*3/uL (ref 0.0–0.7)
Eosinophils Relative: 6 %
HEMATOCRIT: 37.6 % (ref 36.0–46.0)
HEMOGLOBIN: 13 g/dL (ref 12.0–15.0)
LYMPHS ABS: 1.2 10*3/uL (ref 0.7–4.0)
LYMPHS PCT: 12 %
MCH: 30.9 pg (ref 26.0–34.0)
MCHC: 34.6 g/dL (ref 30.0–36.0)
MCV: 89.3 fL (ref 78.0–100.0)
Monocytes Absolute: 1.1 10*3/uL — ABNORMAL HIGH (ref 0.1–1.0)
Monocytes Relative: 11 %
NEUTROS PCT: 71 %
Neutro Abs: 7.4 10*3/uL (ref 1.7–7.7)
Platelets: 275 10*3/uL (ref 150–400)
RBC: 4.21 MIL/uL (ref 3.87–5.11)
RDW: 14.3 % (ref 11.5–15.5)
WBC: 10.2 10*3/uL (ref 4.0–10.5)

## 2015-08-07 LAB — BASIC METABOLIC PANEL
Anion gap: 13 (ref 5–15)
BUN: 13 mg/dL (ref 6–20)
CHLORIDE: 108 mmol/L (ref 101–111)
CO2: 20 mmol/L — ABNORMAL LOW (ref 22–32)
Calcium: 8.8 mg/dL — ABNORMAL LOW (ref 8.9–10.3)
Creatinine, Ser: 0.71 mg/dL (ref 0.44–1.00)
GFR calc non Af Amer: >60 mL/min (ref >60)
Glucose, Bld: 101 mg/dL — ABNORMAL HIGH (ref 65–99)
POTASSIUM: 2.9 mmol/L — AB (ref 3.5–5.1)
SODIUM: 141 mmol/L (ref 135–145)

## 2015-08-07 LAB — GLUCOSE, CAPILLARY
Glucose-Capillary: 92 mg/dL (ref 65–99)
Glucose-Capillary: 97 mg/dL (ref 65–99)

## 2015-08-07 MED ORDER — POTASSIUM CHLORIDE 10 MEQ/100ML IV SOLN
10.0000 meq | INTRAVENOUS | Status: AC
  Administered 2015-08-07 (×3): 10 meq via INTRAVENOUS
  Filled 2015-08-07 (×3): qty 100

## 2015-08-07 NOTE — Progress Notes (Signed)
Speech Language Pathology Treatment: Dysphagia;Cognitive-Linquistic  Patient Details Name: [name] MRN: [id number] DOB: [birth year] Today's Date: 08/07/2015 Time: [id number] SLP Time Calculation (min) (ACUTE ONLY): 31 min  Assessment / Plan / Recommendation Clinical Impression  Pt refused POs with multiple attempts (refused po's with son's)  therefore unable to determine diet tolerance through skilled observation. Pt able to follow basic 1-step commands this session. Pt able to respond to basic yes/no questions with head nod/shake 2/12x, with improved accuracy to 8/12x when provided yes/no communication board. Pt achieved 80% accuracy when answering basic biographical/environmental questions via word identification given written binary choices. Accuracy decreased significantly when written choices increased to 3. Labial approximations observed during automatic speech task (singing) with no phonation. Recommend use of written binary choices to facilitate communication between pt and care team. Will continue to follow.   HPI HPI: [name] is an 75 y.o. female with a history of hypertension, brought to the emergency room in code stroke status following acute onset of right-sided weakness and speech difficulty.  She has no previous history of stroke or TIA. CT scan of her head with a large left basal ganglia hemorrhage. There is localized mass effect and      SLP Plan  Continue with current plan of care     Recommendations  Diet recommendations: Dysphagia 2 (fine chop);Honey-thick liquid Liquids provided via: Cup;No straw Medication Administration: Whole meds with puree Supervision: Patient able to self feed;Full supervision/cueing for compensatory strategies;Staff to assist with self feeding Compensations: Minimize environmental distractions;Slow rate;Small sips/bites;Lingual sweep for clearance of pocketing Postural Changes and/or Swallow Maneuvers: Seated upright 90 degrees              Oral Care Recommendations: Oral care BID Follow up Recommendations: Inpatient Rehab Plan: Continue with current plan of care     Nyzaiah Kai, Student-SLP     Giovanni Biby [facility] 08/07/2015, 10:48 AM

## 2015-08-07 NOTE — Progress Notes (Signed)
STROKE TEAM PROGRESS NOTE   SUBJECTIVE (INTERVAL HISTORY) Patient  is much more alert today. She remains aphasic but is falling only occasional commands. 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 Blood pressure has been adequately controlled.  She has been refusing to eat food and take her medicines.  I have reviewed her chest x-ray which shows low lung volumes but no abnormality. Blood white count is normal. Electrolytes are significant only for low potassium of 2.9  Which I plan to replace OBJECTIVE Temp:  [97 F (36.1 C)-98.7 F (37.1 C)] 98.7 F (37.1 C) (04/20 1354) Pulse Rate:  [58-83] 79 (04/20 1354) Cardiac Rhythm:  [-] Normal sinus rhythm (04/20 0600) Resp:  [9-20] 20 (04/20 1354) BP: (131-181)/(57-106) 181/79 mmHg (04/20 1354) SpO2:  [96 %-100 %] 100 % (04/20 1354)  CBC:   Recent Labs Lab 08/06/15 1417 08/07/15 0750  WBC 8.0 10.2  NEUTROABS 5.1 7.4  HGB 12.1 13.0  HCT 35.8* 37.6  MCV 89.3 89.3  PLT 271 [id number]    Basic Metabolic Panel:   Recent Labs Lab 08/03/15 0311  08/05/15 1118 08/07/15 0750  NA 141  < > 141 141  K 3.7  --   --  2.9*  CL 111  --   --  108  CO2 17*  --   --  20*  GLUCOSE 128*  --   --  101*  BUN 13  --   --  13  CREATININE 0.71  --   --  0.71  CALCIUM 8.9  --   --  8.8*  < > = values in this interval not displayed.  Lipid Panel: No results found for: CHOL, TRIG, HDL, CHOLHDL, VLDL, LDLCALC HgbA1c: No results found for: HGBA1C Urine Drug Screen: No results found for: LABOPIA, COCAINSCRNUR, LABBENZ, AMPHETMU, THCU, LABBARB    IMAGING  Ct Head Wo Contrast  08/03/2015 1. Increasing size of left basal ganglia hemorrhage since last CT (but same as on MRI), now measuring 5.1 x 3.5 x 4.5 cm. 2. Increasing surrounding vasogenic edema and mass effect with progressive effacement of the left lateral ventricle and midline shift now measuring 3.5 mm. 3. Diffuse sinus disease.  08/01/2015   1. Left basal ganglia hemorrhagic infarct.  2. Mild chronic  small vessel white matter ischemic changes in both cerebral hemispheres.  3. Chronic bilateral frontal, bilateral ethmoid and left maxillary sinusitis.  4. Acute right maxillary sinusitis.   Chest Port 1 View 08/01/2015   1. No acute findings.  2. Accentuation of the mediastinal contours may be due to AP technique and low lung volumes.   MRI/MRA head without contrast 08/03/2015 1. No vascular abnormality or aneurysm underlying the left basal ganglia hemorrhage identified. 2. No large or proximal arterial branch occlusion. Short-segment stenoses at the proximal left M1 and mid right M1 segments. 3. 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  MRI with and without contrast  08/02/2015 1. Increased size of acute intraparenchymal hemorrhage centered at the left basal ganglia, now measuring 5.6 x 3.3 x 3.6 cm (estimated volume 33 mL). Associated localized vasogenic edema and mass effect with up to 6 mm of left-to-right shift. Intraventricular extension with small volume blood layering in the occipital horns of both lateral ventricles. No hydrocephalus or evidence of ventricular trapping. 2. No other acute intracranial abnormality. 3. Mild chronic small vessel ischemic disease.  Portable abdominal x-ray  08/02/2015  The tip of the feeding tube overlies the distal descending duodenum 08/06/15 Low lung volumes with vascular crowding atelectasis. Underlying chronic bronchitic lung changes   PHYSICAL EXAM Neurologic Examination: Mental Status: Awake alert and interactive The patient does  attempts to speak but had severe expressive aphasia.. She follows only some simple midline commands. Cranial Nerves: Does not blink to threat on the right but does so on the left. III/IV/VI-Pupils were equal and reacted normally to light. Extraocular movements were  full and conjugate but slight left gaze preference.  VII-right lower facial weakness facial weakness. VIII-normal. X-no speech output. Motor: Right hemiplegia with 0/5 strength. Minimum withdrawal to painful stimuli. normal strength and tone in the left extremities. Sensory: Withdraws right extremities to painful stimulation. Deep Tendon Reflexes: 2+, asymmetric with increased responses elicited from right extremities. Plantars: Extensor on the right and flexor on the left Gait - not tested   ASSESSMENT/PLAN [name] is a 75 y.o. female with history hypertension and hyperlipidemia presenting with right hemiparesis and speech difficulties.  She did not receive IV t-PA due to large left basal ganglia hemorrhage.  Hemorrhagic Stroke:  Dominant L basal ganglia hemorrhage with cytotoxic cerebral edema and midline shift, hemorrhage probably secondary to hypertension.  Resultant    MRI - as above - Increased size of acute intraparenchymal hemorrhage   MRA - as above - No vascular abnormality or aneurysm underlying the left basal ganglia hemorrhage identified.  Repeat CT this am no significant change in hemorrhage size since yesterday, 4.5 mm shift, edema, mass effect, no hydrocephalus   2D Echo - EF 60-65%, mild AS - performed January 2017  VTE prophylaxis - SCDs DIET DYS 2 Room service appropriate?: Yes; Fluid consistency:: Honey Thick. Tube remains out this am. For ST assessment this am. If fails, replace tube (expect she will need tube)  No antithrombotic prior to admission, now on No antithrombotic secondary to hemorrhage  No indication for ongoing decadron. Will discontinue  Ongoing aggressive stroke risk factor management  Dr. Leonie Man discussed with patient's husband over the phone - will start 3% and place central line - CCM to place line  Therapy recommendations: CLR Disposition:  CLR Hypertensive emergency  BP as high as 193/90 & 120/106 in setting of  neurologic symptoms  160s this am  Received several doses of  PRN antihypertensives yesterday  Resume Cozaar 100 mg daily once tube placed  Hyperlipidemia  Home meds: Lipitor 20 mg daily   Continue statin at discharge  Other Stroke Risk Factors  Advanced age  ETOH use  Other Active Problems  UTI - Cipro ( changed to IV until PO access is again available )  Agitation - PRN Seroquel  Abnormal chest x-ray - PA and lateral views recommended at some point.  Blood pressure - systolic blood pressure goal less than 160 per Dr Irish Elders. PO cozaar on hold.  Hospital day # 6   I have personally examined this patient, reviewed notes, independently viewed imaging studies, participated in medical decision making and plan of care. I have made any additions or clarifications directly to the above note. Agree with note above. The patient has a moderate-sized intracerebral hemorrhage with mild cytotoxic edema and midline shift. She remains at risk for neurological worsening, developing hydrocephalus, death and needs close neurological monitoring and aggressive treatment for cerebral edema. Continue close neurological observation and strict blood pressure control.  D/w son at bedside     as well as husband over the phone. I counseled  the patient to eat and take her medications. Greater than 50% time during this 30 minute visit was spent on counseling and coordination of care about the patient's stroke, aphasia and rehabilitation needs  and discussion with family     Antony Contras, [place] Pager: [phone] 08/07/2015 3:51 PM  To contact Stroke Continuity provider, please refer to [email or web address] After hours, contact General Neurology

## 2015-08-07 NOTE — Progress Notes (Signed)
Physical Therapy Treatment Patient Details Name: [name] MRN: [id number] DOB: 1940-07-26 Today's Date: 08/07/2015    History of Present Illness 75 y.o. female admitted to [facility] on 08/01/15 due to R sided weakness, facial droop and speech difficulties.  MRI revealed L basal ganglia hemorrhagic infarct.  Pt with significant PMHx of HTN.    PT Comments    Pt is progressing nicely today with mobility and engagement.  She is more alert, responding to questions as best she can with thumbs up/down and head nods.  She was able to participate in a functional ADL task at the sink in standing and repeated multiple sit to stands.  She continues to be an excellent CIR candidate and is making great progress today.   Follow Up Recommendations  CIR     Equipment Recommendations  Wheelchair (measurements PT);Wheelchair cushion (measurements PT);3in1 (PT)    Recommendations for Other Services   NA     Precautions / Restrictions Precautions Precautions: Fall Precaution Comments: right sided weakness, impulsivity.     Mobility  Bed Mobility Overal bed mobility: Needs Assistance Bed Mobility: Supine to Sit     Supine to sit: Mod assist     General bed mobility comments: Mod assist to help progress right leg EOB and to help support trunk during transition.  Assist to keep right upper extremity safe during transitions.   Transfers Overall transfer level: Needs assistance Equipment used: None Transfers: Sit to/from [facility] Sit to Stand: Mod assist Stand pivot transfers: Mod assist       General transfer comment: Mod assist to stand at end of bed with pt using foot rest as a railing and support.  assist needed to get trunk fully upright and facilitate hip extension, and block right leg.  Pt was able to reach for far armtrest and take one pivotal step to the recliner on her left hand side.  Stood multiple times EOB and at the sink to preform balance task and ADL task of  brushing her hair.    Ambulation/Gait             General Gait Details: unable without +2 assist       Modified Rankin (Stroke Patients Only) Modified Rankin (Stroke Patients Only) Pre-Morbid Rankin Score: No symptoms Modified Rankin: Severe disability     Balance Overall balance assessment: Needs assistance Sitting-balance support: Feet supported;Single extremity supported Sitting balance-Leahy Scale: Poor Sitting balance - Comments: Pt needs close supervision at best with left arm on bed rail on left side.  At times, pt scoots or has LOB and quickly throws forward or backward.  Assist needed to prevent LOB or fall mod.     Standing balance support: Single extremity supported Standing balance-Leahy Scale: Poor Standing balance comment: Mod assist in standing to block right leg and support trunk for balance.  Pt was able to take hand off of support at sink and brush hair.  Legs were supported by the recliner chair as well in standing.                     Cognition Arousal/Alertness: Awake/alert Behavior During Therapy: Flat affect Overall Cognitive Status: Difficult to assess Area of Impairment: Attention;Following commands;Safety/judgement;Awareness;Problem solving   Current Attention Level: Sustained (preformed ADL task at sink for 1-2 mins)   Following Commands: Follows one step commands inconsistently;Follows one step commands with increased time Safety/Judgement: Decreased awareness of safety;Decreased awareness of deficits Awareness: Intellectual Problem Solving: Difficulty sequencing;Requires verbal cues;Requires  tactile cues General Comments: Pt much more engaged today, responding to questions (although inconsistant with her accuracty) and generally engaging in session.  Son present.            Pertinent Vitals/Pain Pain Assessment: Faces Faces Pain Scale: No hurt           PT Goals (current goals can now be found in the care plan section) Acute  Rehab PT Goals Patient Stated Goal: family would like for her to go to CIR.  Progress towards PT goals: Progressing toward goals    Frequency  Min 4X/week    PT Plan Current plan remains appropriate       End of Session Equipment Utilized During Treatment: Gait belt Activity Tolerance: Patient tolerated treatment well Patient left: in chair;with call bell/phone within reach;with family/visitor present     Time: [id number] PT Time Calculation (min) (ACUTE ONLY): 33 min  Charges:  $Therapeutic Activity: 23-37 mins                      Darik Massing B. [place], [employer], DPT [phone]   08/07/2015, 12:50 PM

## 2015-08-07 NOTE — Progress Notes (Signed)
Inpatient Rehabilitation  Met with patient, spouse and son at bedside today to discuss IP Rehab program in more detail and answered questions.  Plan to work toward [facility] admission for tomorrow, 08/07/15 if patient is medically ready.   Carmelia Roller., CCC/SLP Admission Coordinator  [place]  Cell [phone]

## 2015-08-07 NOTE — Consult Note (Signed)
   [facility] CM Inpatient Consult   08/07/2015  WALDENE HEGGER 03/02/1941 [id number] Follow up note for [facility] services. Patient is eligible for [facility] Care Management services under patient's Medicare  plan. Chart review reveals that the patient has been assigned to the [facility] Stroke follow up calls program.  Patient is also being assessed for inpatient rehab.  If other patient needs arise for community care management needs, please place a [facility] Care Management consult or for questions contact:   Natividad Brood, RN BSN [facility] Liaison  [phone] business mobile phone Toll free office [phone]

## 2015-08-07 NOTE — Care Management Note (Signed)
Case Management Note  Patient Details  Name: [name] MRN: [id number] Date of Birth: 05-27-1940  Subjective/Objective:                    Action/Plan:   Expected Discharge Date:                  Expected Discharge Plan:  [place]  In-House Referral:     Discharge planning Services     Post Acute Care Choice:    Choice offered to:  Patient, Spouse  DME Arranged:    DME Agency:     HH Arranged:    [place] Agency:     Status of Service:  In process, will continue to follow  Medicare Important Message Given:  Other (see comment) Date Medicare IM Given:    Medicare IM give by:    Date Additional Medicare IM Given:    Additional Medicare Important Message give by:     If discussed at [place] of Stay Meetings, dates discussed:  08-07-15  Additional Comments:  Marilu Favre, RN 08/07/2015, 10:19 AM

## 2015-08-08 ENCOUNTER — Inpatient Hospital Stay (HOSPITAL_COMMUNITY): Payer: Medicare Other

## 2015-08-08 LAB — BASIC METABOLIC PANEL
ANION GAP: 13 (ref 5–15)
BUN: 13 mg/dL (ref 6–20)
CHLORIDE: 105 mmol/L (ref 101–111)
CO2: 21 mmol/L — AB (ref 22–32)
Calcium: 8.7 mg/dL — ABNORMAL LOW (ref 8.9–10.3)
Creatinine, Ser: 0.66 mg/dL (ref 0.44–1.00)
GFR calc non Af Amer: >60 mL/min (ref >60)
Glucose, Bld: 90 mg/dL (ref 65–99)
Potassium: 3.4 mmol/L — ABNORMAL LOW (ref 3.5–5.1)
Sodium: 139 mmol/L (ref 135–145)

## 2015-08-08 MED ORDER — POTASSIUM CHLORIDE 10 MEQ/100ML IV SOLN
10.0000 meq | INTRAVENOUS | Status: AC
  Administered 2015-08-08 (×2): 10 meq via INTRAVENOUS
  Filled 2015-08-08 (×2): qty 100

## 2015-08-08 NOTE — PMR Pre-admission (Signed)
PMR Admission Coordinator Pre-Admission Assessment  Patient: [name] is an 75 y.o., female MRN: [id number] DOB: January 29, 1941 Height: 5\' 4"  (162.6 cm) Weight: 64.5 kg (142 lb 3.2 oz)              Insurance Information HMO:     PPO:      PCP:      IPA:      80/20:      OTHER:  PRIMARY: Medicare A & B      Policy#: [id number]      Subscriber: Self CM Name:       Phone#:      Fax#:  Pre-Cert#: Eligible per Passport online Portal       Employer: Not employed  Benefits:  Phone #:      Name:  Eff. Date: 10/17/05     Deduct: $1316.00      Out of Pocket Max: None      Life Max: Unlimited CIR: 100%      SNF: 100% days 1-20; 80% days 21-100 Outpatient: 80%     Co-Pay: 20% Home Health: 100%      Co-Pay: None DME: 80%     Co-Pay: 20% Providers: patient's choice  SECONDARY: [employer]      Policy#: [id number]      Subscriber: Self CM Name:       Phone#:      Fax#:  Pre-Cert#:       Employer:  Benefits:  Phone #:      Name:  Eff. 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  History of Present Illness: [name] is a 75 y.o. right handed female with history of hypertension. Presented 08/01/2015 with acute onset of aphasia and right-sided weakness. Patient lives with spouse independent prior to admission. Husband is a retired  Field seismologist. Blood pressure 120/106. CT of the head and imaging revealed large left basal ganglia hemorrhage with no extension into the left lateral ventricle. MRI of the brain showing increased size of acute intraparenchymal hemorrhage centered at the left basal ganglion measuring 5.6 x 3.3 x 3.6 cm. Associated localized vasogenic edema and mass effect. No hydrocephalus. MRA of the head with no vascular abnormality or aneurysm underlying the left basal ganglia hemorrhage. No larger proximal arterial branch occlusion. Neurology follow-up conservative care and close monitoring of blood pressure. Follow-up CT of the head 08/05/2015 unchanged. EEG 08/06/2015 showed no seizure activity. Currently on a dysphagia 2 honey-thick liquid diet after a modified barium swallow study. Suspect UTI maintained on Cipro. Bouts of agitation with  Seroquel added. Physical occupational therapy evaluations completed with recommendations of physical medicine rehabilitation consult. Patient to be admitted for comprehensive inpatient rehabilitation program.  NIH= Total: 17   Past Medical History  Past Medical History  Diagnosis Date  . Bronchitis, not specified as acute or chronic   . Other chronic sinusitis   . Other chronic allergic conjunctivitis   . Allergic rhinitis, cause unspecified   . 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 Acute renal insufficiency     due to amphotericin 2012  . Irritable bowel   . Hypertension   . Diverticulosis    Family History  family history includes Aortic aneurysm in her father; Heart failure in her mother.  Prior Rehab/Hospitalizations:  Has the patient had major surgery during 100 days prior to admission? No  Current Medications   Current facility-administered medications:  .  0.9 % NaCl with KCl 40 mEq / L  infusion, , Intravenous, Continuous, David L Rinehuls, PA-C, Last Rate: 50 mL/hr at 08/10/15 1642, 50 mL/hr at 08/10/15 1642 .  acetaminophen (TYLENOL)  tablet 650 mg, 650 mg, Oral, Q4H PRN **OR** acetaminophen (TYLENOL) suppository 650 mg, 650 mg, Rectal, Q4H PRN, Wallie Char .  acidophilus (RISAQUAD) capsule 1 capsule, 1 capsule, Oral, BID, Garvin Fila, MD, 1 capsule at 08/07/15 2118 .  antiseptic oral rinse (CPC / CETYLPYRIDINIUM CHLORIDE 0.05%) solution 7 mL, 7 mL, Mouth Rinse, BID, Wallie Char, 7 mL at 08/11/15 1028 .  atorvastatin (LIPITOR) tablet 20 mg, 20 mg, Oral, QHS, Garvin Fila, MD, 20 mg at 08/07/15 2117 .  estrogens (conjugated) (PREMARIN) tablet 0.3 mg, 0.3 mg, Oral, QODAY, Garvin Fila, MD, 0.3 mg at 08/11/15 1012 .  fluticasone (FLONASE) 50 MCG/ACT nasal spray 1 spray, 1 spray, Each Nare, Daily PRN, Garvin Fila, MD .  heparin injection 5,000 Units, 5,000 Units, Subcutaneous, Q8H, Rosalin Hawking, MD, 5,000 Units at 08/10/15 2206 .  hydrALAZINE (APRESOLINE) injection 10 mg, 10 mg, Intravenous, Q15 min PRN, Donzetta Starch, NP, 10 mg at 08/07/15 2142 .  HYDROcodone-acetaminophen (NORCO/VICODIN) 5-325 MG per tablet 1 tablet, 1 tablet, Oral, Q4H PRN, Garvin Fila, MD .  labetalol (NORMODYNE,TRANDATE) injection 10-40 mg, 10-40 mg, Intravenous, Q10 min PRN, Donzetta Starch, NP, 10 mg at 08/05/15 1455 .  latanoprost (XALATAN) 0.005 % ophthalmic solution 1 drop, 1 drop, Both Eyes, QHS, Catha Gosselin, MD, 1 drop at 08/10/15 2219 .  levETIRAcetam (KEPPRA) tablet 500 mg, 500 mg, Oral, BID, Rosalin Hawking, MD, 500 mg at 08/10/15 2211 .  loratadine (CLARITIN) tablet 10 mg, 10 mg, Oral, Daily, Garvin Fila, MD, 10 mg at 08/05/15 1648 .  losartan (COZAAR) tablet 100 mg, 100 mg, Oral, Daily, Catha Gosselin, MD, 100 mg at 08/05/15 1018 .  olopatadine (PATANOL) 0.1 % ophthalmic solution 1 drop, 1 drop, Both Eyes, BID PRN, Wallie Char .  ondansetron (ZOFRAN) injection 4 mg, 4 mg, Intramuscular, Q6H PRN, Catha Gosselin, MD .  ondansetron [facility]) tablet 8 mg, 8 mg, Oral, Q8H PRN, Garvin Fila, MD .  pantoprazole (PROTONIX) EC tablet  40 mg, 40 mg, Oral, QHS, Valeda Malm Rumbarger, RPH, 40 mg at 08/10/15 2213 .  polyvinyl alcohol (LIQUIFILM TEARS) 1.4 % ophthalmic solution 1 drop, 1 drop, Both Eyes, PRN, Chere Jeronimo Greaves, MD .  RESOURCE THICKENUP CLEAR, , Oral, PRN, Garvin Fila, MD  Patients Current Diet: DIET DYS 2 Room service appropriate?: Yes; Fluid consistency:: Honey Thick  Precautions /  Restrictions Precautions Precautions: Fall Precaution Comments: right sided weakness, impulsivity.  Restrictions Weight Bearing Restrictions: No   Has the patient had 2 or more falls or a fall with injury in the past year?No  Prior Activity Level Community (5-7x/wk): Patient was completely independent and active prior to admission.  Patient was out daily, enjoyes to travel, play bridge and exercised daily.    Home Assistive Devices / Equipment Home Assistive Devices/Equipment: None Home Equipment: None (?)  Prior Device Use: Indicate devices/aids used by the patient prior to current illness, exacerbation or injury? None of the above  Prior Functional Level Prior Function Level of Independence: Independent  Self Care: Did the patient need help bathing, dressing, using the toilet or eating?  Independent  Indoor Mobility: Did the patient need assistance with walking from room to room (with or without device)? Independent  Stairs: Did the patient need assistance with internal or external stairs (with or without device)? Independent  Functional Cognition: Did the patient need help planning regular tasks such as shopping or remembering to take medications? Independent  Current Functional Level Cognition  Arousal/Alertness: Lethargic Overall Cognitive Status: Difficult to assess Difficult to assess due to: Impaired communication Current Attention Level: Focused Orientation Level: Other (comment) (nonverbal) Following Commands: Follows one step commands inconsistently Safety/Judgement: Decreased awareness of safety,  Decreased awareness of deficits General Comments: less lethargic than notes from previous day reflect, sleeping soundly on arrival, able to around with voice, and sustains arousal with mobility/activity Attention: Focused, Sustained Focused Attention: Appears intact (intermittently in tact) Sustained Attention: Impaired Sustained Attention Impairment: Verbal basic, Functional basic (able to sustain attention to basic tasks for 10-20 seconds) Problem Solving:  (demosntrates some basic functional problem solving)    Extremity Assessment (includes Sensation/Coordination)  Upper Extremity Assessment: Defer to OT evaluation, RUE deficits/detail RUE Deficits / Details: does not spontaneously move RUE, noting similar tone to previous assessment, and occasionally attempts to reposition RUE with LUE RUE Coordination: decreased fine motor, decreased gross motor  Lower Extremity Assessment: RLE deficits/detail RLE Deficits / Details: able to bear min-mod weight with support in standing (moves left foot to assist with donning socks) RLE Coordination: decreased gross motor    ADLs  Overall ADL's : Needs assistance/impaired Eating/Feeding: Minimal assistance, Bed level Grooming: Wash/dry hands, Wash/dry face, Minimal assistance, Sitting Upper Body Bathing: Minimal assitance, Sitting Lower Body Bathing: Moderate assistance, Sit to/from stand Upper Body Dressing : Maximal assistance, Sitting Lower Body Dressing: Sit to/from stand, Maximal assistance Lower Body Dressing Details (indicate cue type and reason): Pt able to don socks with min A and doff with min guard assist  Toilet Transfer: Moderate assistance, Squat-pivot, BSC Toileting- Clothing Manipulation and Hygiene: Maximal assistance, Sit to/from stand Functional mobility during ADLs: Moderate assistance General ADL Comments: Pt appears very motivated     Mobility  Overal bed mobility: Needs Assistance Bed Mobility: Supine to Sit Supine to  sit: Mod assist, HOB elevated (sit>supine requires max assist!) General bed mobility comments: able to follow commands with tactile cues to roll to right with 1 hand assist and push up on rail to sitting; less control into supine requiring therapist to lift legs and pivot body for patient to return to bed surface    Transfers  Overall transfer level: Needs assistance Equipment used: 1 person hand held assist Transfers: Sit to/from Stand Sit to Stand: Max assist Stand pivot transfers: Mod assist Squat pivot transfers: Max assist, From elevated surface General transfer comment: total assist to support right side and  prevent collapse to right, does attempt to stand tall vs. leaning dependently on therapist; left side assists    Ambulation / Gait / Stairs / Wheelchair Mobility  Ambulation/Gait Ambulation/Gait assistance:  (unable to effectively wt shift to unload feet for pregait) General Gait Details: unable without +2 assist    Posture / Balance Dynamic Sitting Balance Sitting balance - Comments: pt leans heavily to right, requires total assist at trunk to prevent fall to right, not able to hold head up; LUE reflexively props when perturbated to left by therapist Balance Overall balance assessment: Needs assistance Sitting-balance support: Single extremity supported, Feet supported Sitting balance-Leahy Scale: Zero Sitting balance - Comments: pt leans heavily to right, requires total assist at trunk to prevent fall to right, not able to hold head up; LUE reflexively props when perturbated to left by therapist Postural control: Right lateral lean Standing balance support: No upper extremity supported, During functional activity Standing balance-Leahy Scale: Zero Standing balance comment: total assist at R (see sit<>stand comments); stood ~20-30 seconds total, at no time did pt achieve midline and depended on external support to prevent fall    Special needs/care consideration BiPAP/CPAP:  No CPM: No Continuous Drip IV: No Dialysis: no         Life Vest: No Oxygen: No Special Bed: No Trach Size: No Wound Vac (area): No       Skin: Dry but in defined limits                                Bowel mgmt: 4/21 Bladder mgmt: incontinent  Diabetic mgmt: N/A    Previous Home Environment Living Arrangements: Spouse/significant other  Lives With: Spouse Available Help at Discharge: Family, Available 24 hours/day Type of Home: House Home Layout: Two level, 1/2 bath on main level Alternate Level Stairs-Number of Steps: full flight  Home Access: Stairs to enter Technical brewer of Steps: 1 + 1 Bathroom Toilet: Standard Home Care Services: No Additional Comments: .  Family can hire assist if needed.  house has the capability of having an elevator installed - sons are contractors and can make the renovations   Discharge Living Setting Plans for Discharge Living Setting: Lives with (comment) (spouse location to be determined based on patient's needs.  ) Discharge Bathroom Shower/Tub: Other (comment) (Tell the spouse and Chase, Merry Proud what is recommended ) Does the patient have any problems obtaining your medications?: No  Social/Family/Support Systems Patient Roles: Spouse, Parent Anticipated Caregiver: Spouse: Dr. Genelle Bal Anticipated Caregiver's Contact Information: [phone] Ability/Limitations of Caregiver: None Caregiver Availability: 24/7 Discharge Plan Discussed with Primary Caregiver: Yes Is Caregiver In Agreement with Plan?: Yes Does Caregiver/Family have Issues with Lodging/Transportation while Pt is in Rehab?: No  Goals/Additional Needs Patient/Family Goal for Rehab: SLP/PT/OT Min assist  Expected length of stay: 3-4 weeks per consult  Cultural Considerations: None Dietary Needs: Dys.2 textures and honey-thick liquids  Equipment Needs: TBD Special Service Needs: None Additional Information: Family open to recommendations for home set up needs, they  will customize with notice Pt/Family Agrees to Admission and willing to participate: Yes Program Orientation Provided & Reviewed with Pt/Caregiver Including Roles  & Responsibilities: Yes Additional Information Needs: Verbal report of dischrage plans provided to CSW Information Needs to be Provided By: CSW  Decrease burden of Care through IP rehab admission: No  Possible need for SNF placement upon discharge: Not anticipated at this time  Patient Condition: This patient's medical  and functional status has changed since the consult dated: 08/06/15 in which the Rehabilitation Physician determined and documented that the patient's condition is appropriate for intensive rehabilitative care in an inpatient rehabilitation facility. See "History of Present Illness" (above) for medical update. Functional changes are: Per most recent PT note "Patient responding to questions as best she can with thumbs up/down and head nods. She was able to participate in a functional ADL task at the sink in standing and repeated multiple sit to stands." Patient's medical and functional status update has been discussed with the Rehabilitation physician and patient remains appropriate for inpatient rehabilitation. Will admit to inpatient rehab today.  Preadmission Screen Completed By:  Retta Diones, 08/11/2015 11:19 AM ______________________________________________________________________   Discussed status with Dr. Posey Pronto on 08/11/15 at 1123 and received telephone approval for admission today.  Admission Coordinator:  Retta Diones, [id number] 08/11/15

## 2015-08-08 NOTE — Progress Notes (Addendum)
STROKE TEAM PROGRESS NOTE   SUBJECTIVE (INTERVAL HISTORY) I was called during rounds to evaluate the patient as she was lethargic and could barely be aroused.  I interviewed patient's nurse as well as family who informed me that the patient had and sleepy throughout the day and had been difficult to arouse. This represents the significant neurological worsening has yesterday she was easily arousable and was interactive.She has been refusing to eat food and take her medicines.  I have reviewed her Lab results from this morning which are unremarkable except for potassium of 3.4. Blood white count is normal. Electrolytes are significant only for low potassium of 2.9  Which I plan to replace.She has not had any fever.. EEG on 08/06/15 shows no definite epileptiform activity but focal left brain slowing  OBJECTIVE Temp:  [97.7 F (36.5 C)-99.1 F (37.3 C)] 98.4 F (36.9 C) (04/21 1054) Pulse Rate:  [60-81] 60 (04/21 1054) Cardiac Rhythm:  [-]  Resp:  [16-20] 16 (04/21 1054) BP: (140-184)/(62-83) 157/82 mmHg (04/21 1054) SpO2:  [94 %-100 %] 100 % (04/21 1054)  CBC:   Recent Labs Lab 08/06/15 1417 08/07/15 0750  WBC 8.0 10.2  NEUTROABS 5.1 7.4  HGB 12.1 13.0  HCT 35.8* 37.6  MCV 89.3 89.3  PLT 271 [id number]    Basic Metabolic Panel:   Recent Labs Lab 08/07/15 0750 08/08/15 0633  NA 141 139  K 2.9* 3.4*  CL 108 105  CO2 20* 21*  GLUCOSE 101* 90  BUN 13 13  CREATININE 0.71 0.66  CALCIUM 8.8* 8.7*    Lipid Panel: No results found for: CHOL, TRIG, HDL, CHOLHDL, VLDL, LDLCALC HgbA1c: No results found for: HGBA1C Urine Drug Screen: No results found for: LABOPIA, COCAINSCRNUR, LABBENZ, AMPHETMU, THCU, LABBARB    IMAGING  Ct Head Wo Contrast  08/03/2015 1. Increasing size of left basal ganglia hemorrhage since last CT (but same as on MRI), now measuring 5.1 x 3.5 x 4.5 cm. 2. Increasing surrounding vasogenic edema and mass effect with progressive effacement of the left lateral  ventricle and midline shift now measuring 3.5 mm. 3. Diffuse sinus disease.  08/01/2015   1. Left basal ganglia hemorrhagic infarct.  2. Mild chronic small vessel white matter ischemic changes in both cerebral hemispheres.  3. Chronic bilateral frontal, bilateral ethmoid and left maxillary sinusitis.  4. Acute right maxillary sinusitis.   Chest Port 1 View 08/01/2015   1. No acute findings.  2. Accentuation of the mediastinal contours may be due to AP technique and low lung volumes.   MRI/MRA head without contrast 08/03/2015 1. No vascular abnormality or aneurysm underlying the left basal ganglia hemorrhage identified. 2. No large or proximal arterial branch occlusion. Short-segment stenoses at the proximal left M1 and mid right M1 segments. 3. 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  MRI with and without contrast  08/02/2015 1. Increased size of acute intraparenchymal hemorrhage centered at the left basal ganglia, now measuring 5.6 x 3.3 x 3.6 cm (estimated volume 33 mL). Associated localized vasogenic edema and mass effect with up to 6 mm of left-to-right shift. Intraventricular extension with small volume blood layering in the occipital horns of both lateral ventricles. No hydrocephalus or evidence of ventricular trapping. 2. No other acute intracranial abnormality. 3. Mild chronic small vessel ischemic disease.  Portable abdominal x-ray  08/02/2015 The tip of the feeding tube overlies the distal  descending duodenum 08/06/15 Low lung volumes with vascular crowding atelectasis. Underlying chronic bronchitic lung changes  EEG 08/06/15 focal left sided slowing but no seizures  PHYSICAL EXAM Neurologic Examination: Mental Status: Lethargic,, sleepy and unresponsive .She can barely open eyes to sternal rub.The patient does  attempts to  speak but had severe expressive aphasia.. She follows no commands. Cranial Nerves: Does not blink to threat on the right but does so on the left. III/IV/VI-Pupils were equal and reacted normally to light. Extraocular movements were full and conjugate but slight left gaze preference.  VII-right lower facial weakness facial weakness. VIII-normal. X-no speech output. Motor: Right hemiplegia with 0/5 strength. Minimum withdrawal to painful stimuli. Purposeful movements on the left side. No posturing. Sensory: Withdraws right extremities to painful stimulation. Deep Tendon Reflexes: 2+, asymmetric with increased responses elicited from right extremities. Plantars: Extensor on the right and flexor on the left Gait - not tested   ASSESSMENT/PLAN [name] is a 75 y.o. female with history hypertension and hyperlipidemia presenting with right hemiparesis and speech difficulties.  She did not receive IV t-PA due to large left basal ganglia hemorrhage.  Hemorrhagic Stroke:  Dominant L basal ganglia hemorrhage with cytotoxic cerebral edema and midline shift, hemorrhage probably secondary to hypertension.  Resultant  Drowsy with dense right hemiplegia, aphasia and dysphagia  MRI - as above - Increased size of acute intraparenchymal hemorrhage   MRA - as above - No vascular abnormality or aneurysm underlying the left basal ganglia hemorrhage identified.  Repeat CT this am no significant change in hemorrhage size since yesterday, 4.5 mm shift, edema, mass effect, no hydrocephalus   2D Echo - EF 60-65%, mild AS - performed January 2017  VTE prophylaxis - SCDs DIET DYS 2 Room service appropriate?: Yes; Fluid consistency:: Honey Thick. Tube remains out this am. For ST assessment this am. If fails, replace tube (expect she will need tube)  No antithrombotic prior to admission, now on No antithrombotic secondary to hemorrhage  No indication for ongoing decadron. Will  discontinue  Ongoing aggressive stroke risk factor management  Dr. Leonie Man discussed with patient's husband over the phone - will start 3% and place central line - CCM to place line  Therapy recommendations: CLR Disposition:  CLR Hypertensive emergency  BP as high as 193/90 & 120/106 in setting of neurologic symptoms  160s this am  Received several doses of  PRN antihypertensives yesterday  Resume Cozaar 100 mg daily once tube placed  Hyperlipidemia  Home meds: Lipitor 20 mg daily   Continue statin at discharge  Other Stroke Risk Factors  Advanced age  ETOH use  Other Active Problems  UTI - Cipro ( changed to IV until PO access is again available )  Agitation - PRN Seroquel  Abnormal chest x-ray - PA and lateral views recommended at some point.  Blood pressure - systolic blood pressure goal less than 180   PO cozaar on hold.  Hospital day # 7   I have personally examined this patient, reviewed notes, independently viewed imaging studies, participated in medical decision making and plan of care. I have made any additions or clarifications directly to the above note.  . The patient has a moderate-sized intracerebral hemorrhage with mild cytotoxic edema and midline shift. She has had fluctuating mental status  And appears to have declined and her condition today. We repeated a stat CT scan of the head which I have personally reviewed which shows stable appearance of the hematoma and persistent cytotoxic  edema and mild 4.2 mm left to right midline shift.. Continue close neurological observation and strict blood pressure control. If patient's mental status and neurological condition declines further may need to transfer her back to the intensive care unit for hypertonic saline infusion. We will hold off transfer to inpatient rehabilitation for the next few days D/w son at bedside     as well as husband earlier .  Replace ptassium and receheck bmp and cbc in am. DC cipro.Greater than  50% time during this 40 minute visit was spent on counseling and coordination of care about the patient's stroke,Cerebral edema, aphasia and rehabilitation needs  and discussion with family. D/w Dr Georgina Quint, MD Medical Director [place] Pager: [phone] 08/08/2015 2:35 PM  To contact Stroke Continuity provider, please refer to [email or web address] After hours, contact General Neurology

## 2015-08-08 NOTE — Progress Notes (Signed)
Nurse called in room by patient's son concerned that she was not responding. Patient assessed and would not respond to verbal or tactile stimuli. Charge nurse notified and rapid response called. MD also notified. V/S taken and recorded. Patient gradually responded to tactile stimuli. Moved MD's hands when mouth touched and moved feet when touched.MD placed orders and carried out. Will continue to monitor patient closely.

## 2015-08-08 NOTE — Progress Notes (Signed)
STROKE TEAM PROGRESS NOTE   SUBJECTIVE (INTERVAL HISTORY) Patient unable to be aroused this morning. Husband and family at bedside. Dr. Tessa Lerner from rehabilitation has also seen patient and was unable to arouse with sternal rub, though she was seen on rehab PA rounds early this morning and able to show one finger to command. Some deep gag response during rapid response nurse assessment. Unclear etiology of sedentary state. She has been having waxing and waning of awake state. Seroquel when necessary ordered but not administered. No sedating medications have been given.   OBJECTIVE Temp:  [97.7 F (36.5 C)-99.1 F (37.3 C)] 97.8 F (36.6 C) (04/21 0932) Pulse Rate:  [60-81] 60 (04/21 1054) Cardiac Rhythm:  [-] Normal sinus rhythm (04/21 0800) Resp:  [16-20] 16 (04/21 1054) BP: (140-184)/(62-88) 157/82 mmHg (04/21 1054) SpO2:  [94 %-100 %] 100 % (04/21 [id number])   Basic Metabolic Panel:   Recent Labs Lab 08/07/15 0750 08/08/15 0633  NA 141 139  K 2.9* 3.4*  CL 108 105  CO2 20* 21*  GLUCOSE 101* 90  BUN 13 13  CREATININE 0.71 0.66  CALCIUM 8.8* 8.7*    PHYSICAL EXAM Neurologic Examination: Mental Status: Awake alert and interactive The patient does  attempts to speak but had severe expressive aphasia.. She follows only some simple midline commands. Cranial Nerves: Does not blink to threat on the right but does so on the left. III/IV/VI-Pupils were equal and reacted normally to light. Extraocular movements were full and conjugate but slight left gaze preference.  VII-right lower facial weakness facial weakness. VIII-normal. X-no speech output. Motor: Right hemiplegia with 0/5 strength. Minimum withdrawal to painful stimuli. normal strength and tone in the left extremities. Sensory: Withdraws right extremities to painful stimulation. Deep Tendon Reflexes: 2+, asymmetric with increased responses elicited from right extremities. Plantars: Extensor on the right and flexor on  the left Gait - not tested   ASSESSMENT/PLAN Ms. COSIMA PILLADO is a 75 y.o. female with history hypertension and hyperlipidemia presenting with right hemiparesis and speech difficulties.  She did not receive IV t-PA due to large left basal ganglia hemorrhage.  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  Waxing and waning mental status  - anticipate she may spontaneously resolve  Stat CT head now - Dr. Leonie Man will follow up results with family  Hold rehab transfer for now  D/c seroquel  D/c cipro  VTE prophylaxis - SCDs  Nurse has been instructed to hold medications and diet for now. DIET DYS 2 Room service appropriate?: Yes; Fluid consistency:: Honey Thick. Can resume diet if mental status improves. Husband aware he may have to address feeding tube issues in the near future.  No antithrombotic prior to admission  Ongoing aggressive stroke risk factor management  Dr. Leonie Man discussed with patient's husband at the bedside as well as her son and nurse. Also discussed with Dr. Tessa Lerner from rehabilitation.  Therapy recommendations: CIR  Disposition:  Plan CIR when stable. For today, will keep in hospital.  Hypertensive emergency  BP as high as 193/90 & 120/106 in setting of neurologic symptoms  Received 2 doses of  PRN antihypertensives yesterday  SBP goal < 180  Unable to give po blood pressure medicines due to somnolent status. Last dose Cozaar 100 mg 08/05/2015  Hyperlipidemia  Home meds: Lipitor 20 mg daily   lipitor ordered, though somnolent. Last dose 08/07/2015  Continue statin at  discharge  Other Stroke Risk Factors  Advanced age  ETOH use  Other Active Problems  UTI - treated with Cipro. Received total of 8 doses of Cipro thus far. Will discontinue today given possible association with neurologic change.  Agitation - PRN  Seroquel had been ordered. It was discontinued this am  Abnormal chest x-ray - PA and lateral views recommended at some point.  Hospital day # [place] for Pager information 08/08/2015 11:11 AM  I have personally examined this patient, reviewed notes, independently viewed imaging studies, participated in medical decision making and plan of care. I have made any additions or clarifications directly to the above note. Agree with note above.    Antony Contras, MD Medical Director [facility] Pager: [phone] 08/08/2015 3:16 PM  To contact Stroke Continuity provider, please refer to [email or web address] After hours, contact General Neurology

## 2015-08-08 NOTE — Progress Notes (Signed)
OT Cancellation Note  Patient Details Name: [name] MRN: [id number] DOB: 1940/12/02   Cancelled Treatment:    Reason Eval/Treat Not Completed: Medical issues which prohibited therapy - Pt with significant neuro changes per RN and chart.  Pt has been minimally responsive throughout the day.  Will defer OT this pm - spoke with son.  Will check back next week.   Darlina Rumpf [place], OTR/L [id number]   08/08/2015, 3:53 PM

## 2015-08-08 NOTE — Care Management Important Message (Signed)
Important Message  Patient Details  Name: [name] MRN: [id number] Date of Birth: March 24, 1941   Medicare Important Message Given:  Yes    [name] P [place] 08/08/2015, 1:26 PM

## 2015-08-08 NOTE — Progress Notes (Signed)
Inpatient Rehabilitation  Note changes in status this am with work-up ongoing at this time.  Met with patient's spouse at the bedside and discussed plan to follow for readiness next week.  My co-worker Gerlean Ren will follow up on Monday, 08/11/15.  Carmelia Roller., CCC/SLP Admission Coordinator  [place]  Cell [phone]

## 2015-08-08 NOTE — Progress Notes (Signed)
PT Cancellation Note  Patient Details Name: [name] MRN: [id number] DOB: April 02, 1941   Cancelled Treatment:    Reason Eval/Treat Not Completed: Medical issues which prohibited therapy (pt difficult to arouse).  Will hold PT until pt more medically appropriate.  Collie Siad PT, DPT  Pager: [phone] Phone: [phone] 08/08/2015, 10:57 AM

## 2015-08-08 NOTE — Progress Notes (Addendum)
Speech Language Pathology Treatment: Cognitive-Linquistic  Patient Details Name: [name] MRN: [id number] DOB: 1941/03/14 Today's Date: 08/08/2015 Time: [id number] SLP Time Calculation (min) (ACUTE ONLY): 13 min  Assessment / Plan / Recommendation Clinical Impression  Pt maintained eye closure for the majority of the session with the exception of a few moments of opening to request. Pt refused all PO by shaking her head, "no" with verbal and physical presentation. Attempted further pt engagement with washing face and brushing teeth. Pt initially took the washcloth, but placed it on the bed. Pt opened mouth to allow for tooth brushing. 1 spontaneous swallow noted. No true following of directives. Pt waved hand when the therapist said goodbye at the conclusion of the session. Minimal progress this date.   HPI HPI: [name] is an 75 y.o. female with a history of hypertension, brought to the emergency room in code stroke status following acute onset of right-sided weakness and speech difficulty.  She has no previous history of stroke or TIA. CT scan of her head with a large left basal ganglia hemorrhage. There is localized mass effect and      SLP Plan  Continue with current plan of care     Recommendations                Oral Care Recommendations: Oral care BID Follow up Recommendations: Inpatient Rehab Plan: Continue with current plan of care     [place] MA, CCC-SLP 08/08/2015, 9:19 AM

## 2015-08-09 ENCOUNTER — Inpatient Hospital Stay (HOSPITAL_COMMUNITY): Payer: Medicare Other

## 2015-08-09 DIAGNOSIS — E785 Hyperlipidemia, unspecified: Secondary | ICD-10-CM

## 2015-08-09 LAB — CBC WITH DIFFERENTIAL/PLATELET
BASOS ABS: 0 10*3/uL (ref 0.0–0.1)
BASOS PCT: 0 %
EOS ABS: 0.7 10*3/uL (ref 0.0–0.7)
EOS PCT: 5 %
HCT: 36.7 % (ref 36.0–46.0)
HEMOGLOBIN: 12.4 g/dL (ref 12.0–15.0)
Lymphocytes Relative: 9 %
Lymphs Abs: 1.2 10*3/uL (ref 0.7–4.0)
MCH: 30.2 pg (ref 26.0–34.0)
MCHC: 33.8 g/dL (ref 30.0–36.0)
MCV: 89.5 fL (ref 78.0–100.0)
Monocytes Absolute: 1.4 10*3/uL — ABNORMAL HIGH (ref 0.1–1.0)
Monocytes Relative: 11 %
NEUTROS PCT: 75 %
Neutro Abs: 9.7 10*3/uL — ABNORMAL HIGH (ref 1.7–7.7)
PLATELETS: 299 10*3/uL (ref 150–400)
RBC: 4.1 MIL/uL (ref 3.87–5.11)
RDW: 13.9 % (ref 11.5–15.5)
WBC: 13 10*3/uL — AB (ref 4.0–10.5)

## 2015-08-09 LAB — BASIC METABOLIC PANEL
Anion gap: 15 (ref 5–15)
BUN: 12 mg/dL (ref 6–20)
CALCIUM: 8.9 mg/dL (ref 8.9–10.3)
CO2: 16 mmol/L — AB (ref 22–32)
CREATININE: 0.68 mg/dL (ref 0.44–1.00)
Chloride: 106 mmol/L (ref 101–111)
GLUCOSE: 76 mg/dL (ref 65–99)
Potassium: 3.4 mmol/L — ABNORMAL LOW (ref 3.5–5.1)
Sodium: 137 mmol/L (ref 135–145)

## 2015-08-09 MED ORDER — HEPARIN SODIUM (PORCINE) 5000 UNIT/ML IJ SOLN
5000.0000 [IU] | Freq: Three times a day (TID) | INTRAMUSCULAR | Status: DC
  Administered 2015-08-09 – 2015-08-11 (×5): 5000 [IU] via SUBCUTANEOUS
  Filled 2015-08-09 (×6): qty 1

## 2015-08-09 MED ORDER — LEVETIRACETAM 500 MG PO TABS
500.0000 mg | ORAL_TABLET | Freq: Two times a day (BID) | ORAL | Status: DC
  Administered 2015-08-10: 500 mg via ORAL
  Filled 2015-08-09 (×2): qty 1

## 2015-08-09 NOTE — Progress Notes (Signed)
STROKE TEAM PROGRESS NOTE   SUBJECTIVE (INTERVAL HISTORY) Multiple family members at the bedside. The family was concerned about the patient's waxing and waning level of alertness. I encouraged them to keep the patient stimulated during the day to correct sleep wake cycle disturbance, will do stat EEG and check UA.  OBJECTIVE Temp:  [97.8 F (36.6 C)-98.6 F (37 C)] 98 F (36.7 C) (04/22 2154) Pulse Rate:  [76-83] 81 (04/22 2154) Cardiac Rhythm:  [-] Normal sinus rhythm (04/22 2000) Resp:  [18-20] 18 (04/22 2154) BP: (155-177)/(70-94) 168/80 mmHg (04/22 2154) SpO2:  [93 %-97 %] 93 % (04/22 2154)  CBC:   Recent Labs Lab 08/07/15 0750 08/09/15 0229  WBC 10.2 13.0*  NEUTROABS 7.4 9.7*  HGB 13.0 12.4  HCT 37.6 36.7  MCV 89.3 89.5  PLT 275 [id number]    Basic Metabolic Panel:   Recent Labs Lab 08/08/15 0633 08/09/15 0229  NA 139 137  K 3.4* 3.4*  CL 105 106  CO2 21* 16*  GLUCOSE 90 76  BUN 13 12  CREATININE 0.66 0.68  CALCIUM 8.7* 8.9    Lipid Panel: No results found for: CHOL, TRIG, HDL, CHOLHDL, VLDL, LDLCALC HgbA1c: No results found for: HGBA1C Urine Drug Screen: No results found for: LABOPIA, COCAINSCRNUR, LABBENZ, AMPHETMU, THCU, LABBARB    IMAGING  Ct Head Wo Contrast  08/01/2015   1. Left basal ganglia hemorrhagic infarct.  2. Mild chronic small vessel white matter ischemic changes in both cerebral hemispheres.  3. Chronic bilateral frontal, bilateral ethmoid and left maxillary sinusitis.  4. Acute right maxillary sinusitis.   08/03/2015 1. Increasing size of left basal ganglia hemorrhage since last CT (but same as on MRI), now measuring 5.1 x 3.5 x 4.5 cm. 2. Increasing surrounding vasogenic edema and mass effect with progressive effacement of the left lateral ventricle and midline shift now measuring 3.5 mm. 3. Diffuse sinus disease.  08/08/2015 1. Left hemisphere hyperdense intra-axial hemorrhage is stable to slightly increased since 08/06/2015. Estimated  hemorrhage volume 35 mL. 2. Surrounding edema and regional mass effect are stable, including mild rightward midline shift of 4 mm. 3. No new intracranial abnormality.  Chest Port 1 View 08/01/2015   1. No acute findings.  2. Accentuation of the mediastinal contours may be due to AP technique and low lung volumes.   08/09/2015 No active disease.  MRA head without contrast 08/03/2015 1. No vascular abnormality or aneurysm underlying the left basal ganglia hemorrhage identified. 2. No large or proximal arterial branch occlusion. Short-segment stenoses at the proximal left M1 and mid right M1 segments. 3. 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  MRI with and without contrast  08/02/2015 1. Increased size of acute intraparenchymal hemorrhage centered at the left basal ganglia, now measuring 5.6 x 3.3 x 3.6 cm (estimated volume 33 mL). Associated localized vasogenic edema and mass effect with up to 6 mm of left-to-right shift. Intraventricular extension with small volume blood layering in the occipital horns of both lateral ventricles. No hydrocephalus or evidence of ventricular trapping. 2. No other acute intracranial abnormality. 3. Mild chronic small vessel ischemic disease.  EEG  08/06/15 focal left sided slowing but no seizures 08/09/15 Abnormal awake and drowsy routine inpatient EEG suggestive of underlying neuronal dysfunction in the left frontotemporal region, with epileptogenic potential as described. No evidence of electrographic seizures were seen during this recording.  TTE -  -  Left ventricle: The cavity size was normal. Wall thickness was  increased in a pattern of mild LVH. Systolic function was normal.  The estimated ejection fraction was in the range of 60% to 65%.  Wall motion was normal; there were no regional wall  motion  abnormalities. Doppler parameters are consistent with abnormal  left ventricular relaxation (grade 1 diastolic dysfunction). - Aortic valve: There was mild stenosis. Valve area (VTI): 1.1  cm^2. Valve area (Vmax): 1.15 cm^2. Valve area (Vmean): 1.15  cm^2. - Mitral valve: Mildly calcified annulus. There was mild  regurgitation.   PHYSICAL EXAM Neurologic Examination: Mental Status: Lethargic, sleepy but arousable. Able to maintain eyes open for a short period of time. Non verbal and she follows no commands. Cranial Nerves: Does not blink to threat on the right but does so on the left. III/IV/VI-Pupils were equal and reacted normally to light. Extraocular movements were full and conjugate but slight left gaze preference.  VII-right lower facial weakness facial weakness. VIII-normal. X-no speech output. Motor: Right hemiplegia with 0/5 strength RUE and 2/5 RLE on pain stimulation. Purposeful movements on the left side. No posturing. Sensory: Withdraws right extremities to painful stimulation. Deep Tendon Reflexes: 2+, asymmetric with increased responses elicited from right extremities. Plantars: Extensor on the right and flexor on the left Gait - not tested   ASSESSMENT/PLAN [name] is a 75 y.o. female with history hypertension and hyperlipidemia presenting with right hemiparesis and speech difficulties.  She did not receive IV t-PA due to large left basal ganglia hemorrhage.  Hemorrhagic Stroke:  Dominant L basal ganglia hemorrhage with cytotoxic cerebral edema and midline shift, hemorrhage probably secondary to hypertension.  Resultant  Drowsy with dense right hemiplegia, aphasia and dysphagia  MRI acute left BG ICH   MRA  No vascular abnormality or aneurysm underlying the left basal ganglia hemorrhage identified.  Repeat CT this am no significant change in hemorrhage size since yesterday, 4.5 mm shift, edema, mass effect, no hydrocephalus   2D  Echo - EF 60-65%, mild AS - performed January 2017  VTE prophylaxis - heparin subq  DIET DYS 2 Room service appropriate?: Yes; Fluid consistency:: Honey Thick.   No antithrombotic prior to admission, now on No antithrombotic secondary to hemorrhage.  Ongoing aggressive stroke risk factor management  Therapy recommendations: CIR  Disposition:  CIR  AMS  EEG - 08/06/15 focal left sided slowing but no seizures  EEG - 08/09/15 Abnormal awake and drowsy routine inpatient EEG suggestive of underlying neuronal dysfunction in the left frontotemporal region, with epileptogenic potential as described. No evidence of electrographic seizures were seen during this recording. Clinical correlation is recommended.   Due to abnormal EEG, will start keppra 500mg  bid.  UA pending  WBC 9.8-> 13.0  Afebrile   CXR no infection or pneumonia  Encourage increase daily activity, correct sleep wake cycle disturbance  Hypertensive emergency  BP as high as 193/90 & 120/106 in setting of neurologic symptoms  160s this am  Received several doses of  PRN antihypertensives   Resume Cozaar 100 mg daily once tube placed  Hyperlipidemia  Home meds: Lipitor 20 mg daily   Continue statin at discharge  Other Stroke Risk Factors  Advanced age  ETOH use  Other Active Problems  Agitation - resolved   Hospital day # 8  Rosalin Hawking, MD PhD Stroke Neurology 08/09/2015 10:47 PM   To contact Stroke Continuity provider, please refer to [email or web address] After hours, contact General Neurology

## 2015-08-09 NOTE — Procedures (Signed)
History: [name] is an 75 y.o. female patient with altered mental status. Routine inpatient EEG was performed for further evaluation.   Patient Active Problem List   Diagnosis Date Noted  . Altered mental status   . 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 Brain herniation ([place]) 08/05/2015  . ICH (intracerebral hemorrhage) ([place]) 08/01/2015  . Hepatitis 08/12/2010  . Renal failure 08/12/2010  . 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 CONJUNCTIVITIS, ALLERGIC 09/02/2008  . 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 ALLERGIC RHINITIS 09/02/2008  . BRONCHITIS 09/02/2008     Current facility-administered medications:  .  0.9 %  sodium chloride infusion, , Intravenous, Continuous, Wallie Char, Stopped at 08/05/15 1300 .  0.9 %  sodium chloride infusion, , Intravenous, Continuous, David L Rinehuls, PA-C, Last Rate: 75 mL/hr at 08/08/15 1331, 75 mL/hr at 08/08/15 1331 .  acetaminophen (TYLENOL) tablet 650 mg, 650 mg, Oral, Q4H PRN **OR** acetaminophen (TYLENOL) suppository 650 mg, 650 mg, Rectal, Q4H PRN, Wallie Char .  acidophilus (RISAQUAD) capsule 1 capsule, 1 capsule, Oral, BID, Garvin Fila, MD, 1 capsule at 08/07/15 2118 .  antiseptic oral rinse (CPC / CETYLPYRIDINIUM CHLORIDE 0.05%) solution 7 mL, 7 mL, Mouth Rinse, BID, Wallie Char, 7 mL at 08/08/15 1055 .  atorvastatin (LIPITOR) tablet 20 mg, 20 mg, Oral, QHS, Garvin Fila, MD, 20 mg at 08/07/15 2117 .  estrogens (conjugated) (PREMARIN) tablet 0.3 mg, 0.3 mg, Oral, QODAY, Garvin Fila, MD, 0.3 mg at 08/05/15 1646 .  fluticasone (FLONASE) 50 MCG/ACT nasal spray 1 spray, 1 spray, Each Nare, Daily PRN, Garvin Fila, MD .  hydrALAZINE (APRESOLINE) injection 10 mg, 10 mg, Intravenous, Q15 min PRN, Donzetta Starch, NP, 10 mg at 08/07/15 2142 .  HYDROcodone-acetaminophen (NORCO/VICODIN) 5-325 MG per tablet 1 tablet, 1 tablet, Oral, Q4H PRN, Garvin Fila, MD .  labetalol (NORMODYNE,TRANDATE) injection 10-40 mg,  10-40 mg, Intravenous, Q10 min PRN, Donzetta Starch, NP, 10 mg at 08/05/15 1455 .  latanoprost (XALATAN) 0.005 % ophthalmic solution 1 drop, 1 drop, Both Eyes, QHS, Catha Gosselin, MD, 1 drop at 08/08/15 2200 .  loratadine (CLARITIN) tablet 10 mg, 10 mg, Oral, Daily, Garvin Fila, MD, 10 mg at 08/05/15 1648 .  losartan (COZAAR) tablet 100 mg, 100 mg, Oral, Daily, Catha Gosselin, MD, 100 mg at 08/05/15 1018 .  olopatadine (PATANOL) 0.1 % ophthalmic solution 1 drop, 1 drop, Both Eyes, BID PRN, Wallie Char .  ondansetron (ZOFRAN) injection 4 mg, 4 mg, Intramuscular, Q6H PRN, Catha Gosselin, MD .  ondansetron [facility]) tablet 8 mg, 8 mg, Oral, Q8H PRN, Garvin Fila, MD .  pantoprazole (PROTONIX) EC tablet 40 mg, 40 mg, Oral, QHS, Valeda Malm Rumbarger, RPH, 40 mg at 08/07/15 2117 .  polyvinyl alcohol (LIQUIFILM TEARS) 1.4 % ophthalmic solution 1 drop, 1 drop, Both Eyes, PRN, Catha Gosselin, MD .  RESOURCE THICKENUP CLEAR, , Oral, PRN, Garvin Fila, MD   Introduction:  This is a 19 channel routine scalp EEG performed at the bedside with bipolar and monopolar montages arranged in accordance to the international 10/20 system of electrode placement. One channel was dedicated to EKG recording.   Findings:  The background rhythm was normal 8.5-9 Hz alpha in the right hemisphere. Mild focal slowing in the left temporal region is seen, with rare intermittent left frontotemporal abnormal epileptiform discharges in the form of sharps with phase reversals were noted . No evidence of electrographic seizures were noted during  this recording.   Impression:  Abnormal awake and drowsy routine inpatient EEG suggestive of underlying neuronal dysfunction in the left frontotemporal region, with epileptogenic potential as described. No evidence of electrographic seizures were seen during this recording. Clinical correlation is recommended .

## 2015-08-09 NOTE — Progress Notes (Signed)
STAT EEG completed; results pending. 

## 2015-08-09 NOTE — Progress Notes (Signed)
PT Cancellation Note  Patient Details Name: [name] MRN: [id number] DOB: 08/08/1940   Cancelled Treatment:    Reason Eval/Treat Not Completed: Patient at procedure or test/unavailable (per MD request arrived to unit to treat patient, spoke with RN but as began to enter room tech arrived to perform stat EEG. Will defer due to testing)   Melford Aase 08/09/2015, 1:25 PM Elwyn Reach, [place]

## 2015-08-09 NOTE — Progress Notes (Signed)
Patient is very lethargic/sleepy today and  difficult to arouse, did not eat/take anything including meds by mouth. Pt is not able to follow command, family at bedside.   Bladder scaned per physician request, scan=156ml. Will continue to monitor.

## 2015-08-09 NOTE — Progress Notes (Signed)
Patient is observed to be more awake this evening. She is not following commands, or verbal, eyes are open and looking around. Son at her bedside.

## 2015-08-10 LAB — CBC
HEMATOCRIT: 34.9 % — AB (ref 36.0–46.0)
HEMOGLOBIN: 12 g/dL (ref 12.0–15.0)
MCH: 30.1 pg (ref 26.0–34.0)
MCHC: 34.4 g/dL (ref 30.0–36.0)
MCV: 87.5 fL (ref 78.0–100.0)
Platelets: 282 10*3/uL (ref 150–400)
RBC: 3.99 MIL/uL (ref 3.87–5.11)
RDW: 13.5 % (ref 11.5–15.5)
WBC: 11.9 10*3/uL — ABNORMAL HIGH (ref 4.0–10.5)

## 2015-08-10 LAB — BASIC METABOLIC PANEL
Anion gap: 15 (ref 5–15)
BUN: 10 mg/dL (ref 6–20)
CHLORIDE: 105 mmol/L (ref 101–111)
CO2: 16 mmol/L — AB (ref 22–32)
Calcium: 8.7 mg/dL — ABNORMAL LOW (ref 8.9–10.3)
Creatinine, Ser: 0.66 mg/dL (ref 0.44–1.00)
GFR calc Af Amer: >60 mL/min (ref >60)
GFR calc non Af Amer: >60 mL/min (ref >60)
GLUCOSE: 81 mg/dL (ref 65–99)
POTASSIUM: 3 mmol/L — AB (ref 3.5–5.1)
Sodium: 136 mmol/L (ref 135–145)

## 2015-08-10 MED ORDER — POTASSIUM CHLORIDE 10 MEQ/100ML IV SOLN
10.0000 meq | INTRAVENOUS | Status: DC
  Administered 2015-08-10: 10 meq via INTRAVENOUS
  Filled 2015-08-10: qty 100

## 2015-08-10 MED ORDER — POTASSIUM CHLORIDE IN NACL 40-0.9 MEQ/L-% IV SOLN
INTRAVENOUS | Status: DC
  Administered 2015-08-10: 50 mL/h via INTRAVENOUS
  Filled 2015-08-10 (×2): qty 1000

## 2015-08-10 MED ORDER — POTASSIUM CHLORIDE 10 MEQ/100ML IV SOLN
10.0000 meq | INTRAVENOUS | Status: AC
  Administered 2015-08-10 (×2): 10 meq via INTRAVENOUS
  Filled 2015-08-10 (×2): qty 100

## 2015-08-10 NOTE — Progress Notes (Signed)
STROKE TEAM PROGRESS NOTE   SUBJECTIVE (INTERVAL HISTORY) Dr. London Pepper was at the bedside. The patient appears much more alert today; however, she is still aphasic and unable to follow most commands. Dr. London Pepper is concerned about the patient's urinary incontinence. Limited options since the patient is unable to communicate her needs. The patient still demonstrates poor PO intake including her medications.  OBJECTIVE Temp:  [98 F (36.7 C)-98.6 F (37 C)] 98.5 F (36.9 C) (04/23 1414) Pulse Rate:  [69-81] 72 (04/23 1414) Cardiac Rhythm:  [-] Normal sinus rhythm (04/23 1200) Resp:  [18-20] 20 (04/23 1414) BP: (160-170)/(68-94) 165/68 mmHg (04/23 1414) SpO2:  [93 %-100 %] 100 % (04/23 1414)  CBC:   Recent Labs Lab 08/07/15 0750 08/09/15 0229 08/10/15 0550  WBC 10.2 13.0* 11.9*  NEUTROABS 7.4 9.7*  --   HGB 13.0 12.4 12.0  HCT 37.6 36.7 34.9*  MCV 89.3 89.5 87.5  PLT 275 299 [id number]    Basic Metabolic Panel:   Recent Labs Lab 08/09/15 0229 08/10/15 0550  NA 137 136  K 3.4* 3.0*  CL 106 105  CO2 16* 16*  GLUCOSE 76 81  BUN 12 10  CREATININE 0.68 0.66  CALCIUM 8.9 8.7*    Lipid Panel: No results found for: CHOL, TRIG, HDL, CHOLHDL, VLDL, LDLCALC HgbA1c: No results found for: HGBA1C Urine Drug Screen: No results found for: LABOPIA, COCAINSCRNUR, LABBENZ, AMPHETMU, THCU, LABBARB    IMAGING I have personally reviewed the radiological images below and agree with the radiology interpretations.  Ct Head Wo Contrast  08/01/2015   1. Left basal ganglia hemorrhagic infarct.  2. Mild chronic small vessel white matter ischemic changes in both cerebral hemispheres.  3. Chronic bilateral frontal, bilateral ethmoid and left maxillary sinusitis.  4. Acute right maxillary sinusitis.   08/03/2015 1. Increasing size of left basal ganglia hemorrhage since last CT (but same as on MRI), now measuring 5.1 x 3.5 x 4.5 cm. 2. Increasing surrounding vasogenic edema and mass effect with  progressive effacement of the left lateral ventricle and midline shift now measuring 3.5 mm. 3. Diffuse sinus disease.  08/08/2015 1. Left hemisphere hyperdense intra-axial hemorrhage is stable to slightly increased since 08/06/2015. Estimated hemorrhage volume 35 mL. 2. Surrounding edema and regional mass effect are stable, including mild rightward midline shift of 4 mm. 3. No new intracranial abnormality.  Chest Port 1 View 08/01/2015   1. No acute findings.  2. Accentuation of the mediastinal contours may be due to AP technique and low lung volumes.   08/09/2015 No active disease.  MRA head without contrast 08/03/2015 1. No vascular abnormality or aneurysm underlying the left basal ganglia hemorrhage identified. 2. No large or proximal arterial branch occlusion. Short-segment stenoses at the proximal left M1 and mid right M1 segments. 3. 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  MRI with and without contrast  08/02/2015 1. Increased size of acute intraparenchymal hemorrhage centered at the left basal ganglia, now measuring 5.6 x 3.3 x 3.6 cm (estimated volume 33 mL). Associated localized vasogenic edema and mass effect with up to 6 mm of left-to-right shift. Intraventricular extension with small volume blood layering in the occipital horns of both lateral ventricles. No hydrocephalus or evidence of ventricular trapping. 2. No other acute intracranial abnormality. 3. Mild chronic small vessel ischemic disease.  EEG  08/06/15 focal left sided slowing but no seizures  08/09/15 Abnormal awake and drowsy routine inpatient EEG suggestive of underlying neuronal dysfunction in the left frontotemporal region, with epileptogenic potential as described. No evidence of electrographic seizures were seen during this recording.  TTE -  - Left  ventricle: The cavity size was normal. Wall thickness was  increased in a pattern of mild LVH. Systolic function was normal.  The estimated ejection fraction was in the range of 60% to 65%.  Wall motion was normal; there were no regional wall motion  abnormalities. Doppler parameters are consistent with abnormal  left ventricular relaxation (grade 1 diastolic dysfunction). - Aortic valve: There was mild stenosis. Valve area (VTI): 1.1  cm^2. Valve area (Vmax): 1.15 cm^2. Valve area (Vmean): 1.15  cm^2. - Mitral valve: Mildly calcified annulus. There was mild  regurgitation.   PHYSICAL EXAM Neurologic Examination: Mental Status: Lethargic, sleepy but arousable. Able to maintain eyes open for a short period of time. Non verbal and she follows no commands. Cranial Nerves: Does not blink to threat on the right but does so on the left. III/IV/VI-Pupils were equal and reacted normally to light. Extraocular movements were full and conjugate but slight left gaze preference.  VII-right lower facial weakness facial weakness. VIII-normal. X-no speech output. Motor: Right hemiplegia with 0/5 strength RUE and 2/5 RLE on pain stimulation. Purposeful movements on the left side. No posturing. Sensory: Withdraws right extremities to painful stimulation. Deep Tendon Reflexes: 2+, asymmetric with increased responses elicited from right extremities. Plantars: Extensor on the right and flexor on the left Gait - not tested   ASSESSMENT/PLAN Ms. KEONDA FAUSETT is a 75 y.o. female with history hypertension and hyperlipidemia presenting with right hemiparesis and speech difficulties.  She did not receive IV t-PA due to large left basal ganglia hemorrhage.  Hemorrhagic Stroke:  Dominant L basal ganglia hemorrhage with cytotoxic cerebral edema and midline shift, hemorrhage probably secondary to hypertension.  Resultant  Drowsy with dense right hemiplegia, aphasia and dysphagia  MRI acute left  BG ICH   MRA  No vascular abnormality or aneurysm underlying the left basal ganglia hemorrhage identified.  Repeat CT this am no significant change in hemorrhage size since yesterday, 4.5 mm shift, edema, mass effect, no hydrocephalus   2D Echo - EF 60-65%, mild AS - performed January 2017  VTE prophylaxis - heparin subq  DIET DYS 2 Room service appropriate?: Yes; Fluid consistency:: Honey Thick.   No antithrombotic prior to admission, now on No antithrombotic secondary to hemorrhage.  Ongoing aggressive stroke risk factor management  Therapy recommendations: CIR  Disposition:  CIR - ready from medical standpoint  AMS - improving  EEG - 08/06/15 focal left sided slowing but no seizures  EEG - 08/09/15 Abnormal awake and drowsy routine inpatient EEG suggestive of underlying neuronal dysfunction in the left frontotemporal region, with epileptogenic potential as described. No evidence of electrographic seizures were seen during this recording. Clinical correlation is recommended.   Due to abnormal EEG, will start keppra 500mg  bid.  UA pending - not yet collected  WBC 9.8-> 13.0->11.9  Afebrile   CXR no infection or pneumonia  Encourage increase daily activity, correct sleep wake cycle disturbance  Hypertensive emergency  BP as high as 193/90 & 120/106 in setting of neurologic symptoms  160s this am  Resume Cozaar 100 mg daily, however pt poor po desire  Hyperlipidemia  Home meds: Lipitor 20 mg daily   Continue statin at discharge  Other Stroke Risk Factors  Advanced age  ETOH use  Other  Active Problems  Incontinence of urine  Hypokalemia - potassium 3.0 -> 3 runs potassium 10 mEq each and potassium added to IV fluids. Recheck in a.m.  Poor PO intake - may need a temporary feeding tube   Hospital day # 9  Rosalin Hawking, MD PhD Stroke Neurology 08/10/2015 3:06 PM   To contact Stroke Continuity provider, please refer to [email or web address] After hours, contact  General Neurology

## 2015-08-10 NOTE — Progress Notes (Signed)
Fed patient this afternoon, she tried some items on her tray and spit it out. Washed/Shampooed patient's hair, no signs of pain observed. Will continue to monitor.

## 2015-08-10 NOTE — Progress Notes (Signed)
Patient is nonverbal and does not follow commands. Assisted patient when needed. IVF d/cd during shift per MD order. Call bell in reach. Will continue to monitor.

## 2015-08-10 NOTE — Progress Notes (Signed)
Patient is lying in bed sleeping, no signs of pain or discomfort observed. Will continue to monitor.

## 2015-08-10 NOTE — Evaluation (Signed)
Physical Therapy Evaluation Patient Details Name: [name] MRN: [id number] DOB: 10-07-1940 Today's Date: 08/10/2015   History of Present Illness  75 y.o. female admitted to [facility] on 08/01/15 due to R sided weakness, facial droop and speech difficulties.  MRI revealed L basal ganglia hemorrhagic infarct.  Pt with significant PMHx of HTN.    Clinical Impression  Pt re-evaled this visit following change in status over weekend, see below for findings. BP consistent at 169/70's pre/sitting/post assessment. Sleeping soundly, but arousable by voice and able to sustain arousal with mobility activities, but fatigues. Worsened functional mobility today requiring moderate to maximal assist for very basic activities.  Communication and cognitive problems persist, unclear if worsened but appears to be the same -- severely limited with minimal receptive function, enhanced by gestures and tactile cues, noting pt tries to wink and will maintain eye contact most of the time; attempted to verbalize and indicated a question with her expression.  Unclear how well she understand pure verbal input.  Continue to recommend CIR for postacute rehab.    Follow Up Recommendations CIR    Equipment Recommendations       Recommendations for Other Services       Precautions / Restrictions Precautions Precautions: Fall Precaution Comments: right sided weakness, impulsivity.  Restrictions Weight Bearing Restrictions: No      Mobility  Bed Mobility Overal bed mobility: Needs Assistance Bed Mobility: Supine to Sit     Supine to sit: Mod assist;HOB elevated (sit>supine requires max assist!)     General bed mobility comments: able to follow commands with tactile cues to roll to right with 1 hand assist and push up on rail to sitting; less control into supine requiring therapist to lift legs and pivot body for patient to return to bed surface  Transfers Overall transfer level: Needs assistance Equipment  used: 1 person hand held assist Transfers: Sit to/from Stand Sit to Stand: Max assist         General transfer comment: total assist to support right side and prevent collapse to right, does attempt to stand tall vs. leaning dependently on therapist; left side assists  Ambulation/Gait Ambulation/Gait assistance:  (unable to effectively wt shift to unload feet for pregait)              Stairs            Wheelchair Mobility    Modified Rankin (Stroke Patients Only) Modified Rankin (Stroke Patients Only) Pre-Morbid Rankin Score: No symptoms Modified Rankin: Severe disability     Balance Overall balance assessment: Needs assistance Sitting-balance support: Single extremity supported;Feet supported Sitting balance-Leahy Scale: Zero Sitting balance - Comments: pt leans heavily to right, requires total assist at trunk to prevent fall to right, not able to hold head up; LUE reflexively props when perturbated to left by therapist Postural control: Right lateral lean Standing balance support: No upper extremity supported;During functional activity Standing balance-Leahy Scale: Zero Standing balance comment: total assist at R (see sit<>stand comments); stood ~20-30 seconds total, at no time did pt achieve midline and depended on external support to prevent fall                             Pertinent Vitals/Pain Pain Assessment: Faces (no s/s of pain noted) Faces Pain Scale: No hurt    Home Living Family/patient expects to be discharged to:: Private residence Living Arrangements: Spouse/significant other Available Help at Discharge: Family;Available 24 hours/day Type  of Home: House Home Access: Stairs to enter   [employer] of Steps: 1 + 1 Home Layout: Two level;1/2 bath on main level Home Equipment: None (?) Additional Comments: .  Family can hire assist if needed.  house has the capability of having an elevator installed - sons are contractors  and can make the renovations     Prior Function Level of Independence: Independent               Hand Dominance        Extremity/Trunk Assessment   Upper Extremity Assessment: Defer to OT evaluation;RUE deficits/detail RUE Deficits / Details: does not spontaneously move RUE, noting similar tone to previous assessment, and occasionally attempts to reposition RUE with LUE         Lower Extremity Assessment: RLE deficits/detail RLE Deficits / Details: able to bear min-mod weight with support in standing (moves left foot to assist with donning socks)    Cervical / Trunk Assessment: Other exceptions  Communication   Communication: Receptive difficulties;Expressive difficulties  Cognition Arousal/Alertness: Lethargic (awakens to voice, sustains with activity) Behavior During Therapy: Flat affect (winks or sustains eye contact, does try to speak once) Overall Cognitive Status: Difficult to assess Area of Impairment: Attention;Following commands;Awareness   Current Attention Level: Focused   Following Commands: Follows one step commands inconsistently Safety/Judgement: Decreased awareness of safety;Decreased awareness of deficits Awareness: Intellectual Problem Solving: Decreased initiation;Requires verbal cues;Requires tactile cues General Comments: less lethargic than notes from previous day reflect, sleeping soundly on arrival, able to around with voice, and sustains arousal with mobility/activity    General Comments General comments (skin integrity, edema, etc.): pt attempts to wink and maintain eye contact, though not clear that she understands verbal commands as gestural cues used throughout to enhance comprehension    Exercises        Assessment/Plan    PT Assessment Patient needs continued PT services  PT Diagnosis Hemiplegia dominant side   PT Problem List Impaired tone;Cardiopulmonary status limiting activity;Decreased knowledge of precautions;Decreased  safety awareness;Decreased knowledge of use of DME;Decreased cognition;Decreased coordination;Decreased mobility;Decreased balance;Decreased activity tolerance;Decreased range of motion;Decreased strength  PT Treatment Interventions Wheelchair mobility training;Patient/family education;Neuromuscular re-education;Balance training;Therapeutic exercise;Therapeutic activities;Functional mobility training;Gait training;DME instruction   PT Goals (Current goals can be found in the Care Plan section) Acute Rehab PT Goals PT Goal Formulation: Patient unable to participate in goal setting Time For Goal Achievement: 08/24/15 Potential to Achieve Goals: Good    Frequency Min 4X/week   Barriers to discharge        Co-evaluation               End of Session   Activity Tolerance: Patient limited by fatigue Patient left: in bed;with bed alarm set Nurse Communication: Mobility status;Other (comment) (attempt to reposition RUE periodically)         Time: 1440-1511 PT Time Calculation (min) (ACUTE ONLY): 31 min   Charges:   PT Evaluation $PT Re-evaluation: 1 Procedure PT Treatments $Therapeutic Activity: 8-22 mins   PT G Codes:        Herbie Drape 08/10/2015, 3:22 PM

## 2015-08-11 ENCOUNTER — Other Ambulatory Visit: Payer: Self-pay | Admitting: Neurology

## 2015-08-11 ENCOUNTER — Inpatient Hospital Stay (HOSPITAL_COMMUNITY)
Admission: RE | Admit: 2015-08-11 | Discharge: 2015-09-12 | DRG: 056 | Disposition: A | Discharge status: Skilled Nursing Facility | Payer: Medicare Other | Source: Intra-hospital | Attending: Physical Medicine & Rehabilitation | Admitting: Physical Medicine & Rehabilitation

## 2015-08-11 DIAGNOSIS — I69220 Aphasia following other nontraumatic intracranial hemorrhage: Secondary | ICD-10-CM | POA: Diagnosis not present

## 2015-08-11 DIAGNOSIS — I610 Nontraumatic intracerebral hemorrhage in hemisphere, subcortical: Secondary | ICD-10-CM

## 2015-08-11 DIAGNOSIS — Z4682 Encounter for fitting and adjustment of non-vascular catheter: Secondary | ICD-10-CM | POA: Diagnosis not present

## 2015-08-11 DIAGNOSIS — I69291 Dysphagia following other nontraumatic intracranial hemorrhage: Secondary | ICD-10-CM

## 2015-08-11 DIAGNOSIS — I10 Essential (primary) hypertension: Secondary | ICD-10-CM | POA: Diagnosis not present

## 2015-08-11 DIAGNOSIS — Z298 Encounter for other specified prophylactic measures: Secondary | ICD-10-CM | POA: Insufficient documentation

## 2015-08-11 DIAGNOSIS — R32 Unspecified urinary incontinence: Secondary | ICD-10-CM | POA: Diagnosis present

## 2015-08-11 DIAGNOSIS — G4727 Circadian rhythm sleep disorder in conditions classified elsewhere: Secondary | ICD-10-CM | POA: Diagnosis present

## 2015-08-11 DIAGNOSIS — R1310 Dysphagia, unspecified: Secondary | ICD-10-CM

## 2015-08-11 DIAGNOSIS — R5383 Other fatigue: Secondary | ICD-10-CM | POA: Insufficient documentation

## 2015-08-11 DIAGNOSIS — E46 Unspecified protein-calorie malnutrition: Secondary | ICD-10-CM | POA: Diagnosis not present

## 2015-08-11 DIAGNOSIS — I69359 Hemiplegia and hemiparesis following cerebral infarction affecting unspecified side: Secondary | ICD-10-CM | POA: Insufficient documentation

## 2015-08-11 DIAGNOSIS — R0989 Other specified symptoms and signs involving the circulatory and respiratory systems: Secondary | ICD-10-CM | POA: Insufficient documentation

## 2015-08-11 DIAGNOSIS — I69391 Dysphagia following cerebral infarction: Secondary | ICD-10-CM | POA: Insufficient documentation

## 2015-08-11 DIAGNOSIS — E785 Hyperlipidemia, unspecified: Secondary | ICD-10-CM | POA: Diagnosis present

## 2015-08-11 DIAGNOSIS — J309 Allergic rhinitis, unspecified: Secondary | ICD-10-CM | POA: Diagnosis not present

## 2015-08-11 DIAGNOSIS — Z418 Encounter for other procedures for purposes other than remedying health state: Secondary | ICD-10-CM

## 2015-08-11 DIAGNOSIS — G936 Cerebral edema: Secondary | ICD-10-CM | POA: Diagnosis present

## 2015-08-11 DIAGNOSIS — I69321 Dysphasia following cerebral infarction: Secondary | ICD-10-CM | POA: Insufficient documentation

## 2015-08-11 DIAGNOSIS — Z781 Physical restraint status: Secondary | ICD-10-CM | POA: Diagnosis not present

## 2015-08-11 DIAGNOSIS — F329 Major depressive disorder, single episode, unspecified: Secondary | ICD-10-CM | POA: Diagnosis not present

## 2015-08-11 DIAGNOSIS — G8190 Hemiplegia, unspecified affecting unspecified side: Secondary | ICD-10-CM | POA: Diagnosis not present

## 2015-08-11 DIAGNOSIS — I69251 Hemiplegia and hemiparesis following other nontraumatic intracranial hemorrhage affecting right dominant side: Secondary | ICD-10-CM | POA: Diagnosis not present

## 2015-08-11 DIAGNOSIS — Z2989 Encounter for other specified prophylactic measures: Secondary | ICD-10-CM | POA: Insufficient documentation

## 2015-08-11 DIAGNOSIS — I69319 Unspecified symptoms and signs involving cognitive functions following cerebral infarction: Secondary | ICD-10-CM | POA: Insufficient documentation

## 2015-08-11 DIAGNOSIS — R400 Somnolence: Secondary | ICD-10-CM

## 2015-08-11 DIAGNOSIS — R0600 Dyspnea, unspecified: Secondary | ICD-10-CM | POA: Diagnosis not present

## 2015-08-11 DIAGNOSIS — D72829 Elevated white blood cell count, unspecified: Secondary | ICD-10-CM | POA: Insufficient documentation

## 2015-08-11 DIAGNOSIS — K589 Irritable bowel syndrome without diarrhea: Secondary | ICD-10-CM | POA: Diagnosis not present

## 2015-08-11 DIAGNOSIS — I69320 Aphasia following cerebral infarction: Secondary | ICD-10-CM | POA: Diagnosis not present

## 2015-08-11 DIAGNOSIS — Z931 Gastrostomy status: Secondary | ICD-10-CM | POA: Diagnosis not present

## 2015-08-11 DIAGNOSIS — Z0189 Encounter for other specified special examinations: Secondary | ICD-10-CM

## 2015-08-11 DIAGNOSIS — E876 Hypokalemia: Secondary | ICD-10-CM | POA: Diagnosis not present

## 2015-08-11 DIAGNOSIS — I69191 Dysphagia following nontraumatic intracerebral hemorrhage: Secondary | ICD-10-CM | POA: Diagnosis not present

## 2015-08-11 DIAGNOSIS — G8110 Spastic hemiplegia affecting unspecified side: Secondary | ICD-10-CM | POA: Insufficient documentation

## 2015-08-11 DIAGNOSIS — F4321 Adjustment disorder with depressed mood: Secondary | ICD-10-CM | POA: Diagnosis not present

## 2015-08-11 DIAGNOSIS — I69120 Aphasia following nontraumatic intracerebral hemorrhage: Secondary | ICD-10-CM | POA: Diagnosis not present

## 2015-08-11 DIAGNOSIS — I69151 Hemiplegia and hemiparesis following nontraumatic intracerebral hemorrhage affecting right dominant side: Secondary | ICD-10-CM | POA: Diagnosis not present

## 2015-08-11 DIAGNOSIS — I69119 Unspecified symptoms and signs involving cognitive functions following nontraumatic intracerebral hemorrhage: Secondary | ICD-10-CM | POA: Diagnosis not present

## 2015-08-11 DIAGNOSIS — I629 Nontraumatic intracranial hemorrhage, unspecified: Secondary | ICD-10-CM | POA: Diagnosis not present

## 2015-08-11 DIAGNOSIS — R633 Feeding difficulties: Secondary | ICD-10-CM | POA: Diagnosis not present

## 2015-08-11 DIAGNOSIS — R509 Fever, unspecified: Secondary | ICD-10-CM | POA: Diagnosis not present

## 2015-08-11 DIAGNOSIS — K5790 Diverticulosis of intestine, part unspecified, without perforation or abscess without bleeding: Secondary | ICD-10-CM | POA: Diagnosis not present

## 2015-08-11 DIAGNOSIS — Z431 Encounter for attention to gastrostomy: Secondary | ICD-10-CM | POA: Diagnosis not present

## 2015-08-11 DIAGNOSIS — Z4659 Encounter for fitting and adjustment of other gastrointestinal appliance and device: Secondary | ICD-10-CM

## 2015-08-11 DIAGNOSIS — I161 Hypertensive emergency: Secondary | ICD-10-CM

## 2015-08-11 LAB — CBC
HEMATOCRIT: 35.3 % — AB (ref 36.0–46.0)
HEMOGLOBIN: 12.1 g/dL (ref 12.0–15.0)
MCH: 30.2 pg (ref 26.0–34.0)
MCHC: 34.3 g/dL (ref 30.0–36.0)
MCV: 88 fL (ref 78.0–100.0)
PLATELETS: 298 10*3/uL (ref 150–400)
RBC: 4.01 MIL/uL (ref 3.87–5.11)
RDW: 13.7 % (ref 11.5–15.5)
WBC: 13.9 10*3/uL — AB (ref 4.0–10.5)

## 2015-08-11 LAB — URINALYSIS W MICROSCOPIC (NOT AT ARMC)
Bacteria, UA: NONE SEEN
GLUCOSE, UA: NEGATIVE mg/dL
HGB URINE DIPSTICK: NEGATIVE
LEUKOCYTES UA: NEGATIVE
Nitrite: NEGATIVE
PROTEIN: NEGATIVE mg/dL
Specific Gravity, Urine: 1.025 (ref 1.005–1.030)
pH: 6 (ref 5.0–8.0)

## 2015-08-11 LAB — BASIC METABOLIC PANEL
Anion gap: 16 — ABNORMAL HIGH (ref 5–15)
BUN: 12 mg/dL (ref 6–20)
CALCIUM: 8.8 mg/dL — AB (ref 8.9–10.3)
CO2: 16 mmol/L — ABNORMAL LOW (ref 22–32)
CREATININE: 0.72 mg/dL (ref 0.44–1.00)
Chloride: 105 mmol/L (ref 101–111)
GFR calc Af Amer: >60 mL/min (ref >60)
Glucose, Bld: 80 mg/dL (ref 65–99)
Potassium: 3.6 mmol/L (ref 3.5–5.1)
Sodium: 137 mmol/L (ref 135–145)

## 2015-08-11 LAB — MAGNESIUM: MAGNESIUM: 1.8 mg/dL (ref 1.7–2.4)

## 2015-08-11 MED ORDER — LOSARTAN POTASSIUM 50 MG PO TABS
100.0000 mg | ORAL_TABLET | Freq: Every day | ORAL | Status: DC
  Administered 2015-08-13 – 2015-09-12 (×29): 100 mg via ORAL
  Filled 2015-08-11 (×35): qty 2

## 2015-08-11 MED ORDER — PANTOPRAZOLE SODIUM 40 MG IV SOLR
40.0000 mg | Freq: Every day | INTRAVENOUS | Status: DC
  Administered 2015-08-11 – 2015-08-26 (×16): 40 mg via INTRAVENOUS
  Filled 2015-08-11 (×18): qty 40

## 2015-08-11 MED ORDER — LEVETIRACETAM 500 MG PO TABS: 500.0000 mg | ORAL_TABLET | Freq: Two times a day (BID) | ORAL | Status: DC

## 2015-08-11 MED ORDER — FLUTICASONE PROPIONATE 50 MCG/ACT NA SUSP
1.0000 | Freq: Every day | NASAL | Status: DC | PRN
  Filled 2015-08-11: qty 16

## 2015-08-11 MED ORDER — LORATADINE 10 MG PO TABS
10.0000 mg | ORAL_TABLET | Freq: Every day | ORAL | Status: DC
  Administered 2015-08-13 – 2015-09-12 (×27): 10 mg via ORAL
  Filled 2015-08-11 (×31): qty 1

## 2015-08-11 MED ORDER — DEXTROSE-NACL 5-0.45 % IV SOLN
INTRAVENOUS | Status: DC
  Administered 2015-08-11 – 2015-08-14 (×4): via INTRAVENOUS
  Administered 2015-08-14: 500 mL via INTRAVENOUS

## 2015-08-11 MED ORDER — HYDROCODONE-ACETAMINOPHEN 5-325 MG PO TABS
1.0000 | ORAL_TABLET | ORAL | Status: DC | PRN
  Administered 2015-08-19 – 2015-09-01 (×9): 1 via ORAL
  Filled 2015-08-11 (×9): qty 1

## 2015-08-11 MED ORDER — ACETAMINOPHEN 325 MG PO TABS
650.0000 mg | ORAL_TABLET | ORAL | Status: DC | PRN
  Administered 2015-08-25 – 2015-09-09 (×5): 650 mg via ORAL
  Filled 2015-08-11 (×5): qty 2

## 2015-08-11 MED ORDER — HEPARIN SODIUM (PORCINE) 5000 UNIT/ML IJ SOLN
5000.0000 [IU] | Freq: Three times a day (TID) | INTRAMUSCULAR | Status: DC
  Administered 2015-08-11 – 2015-09-12 (×89): 5000 [IU] via SUBCUTANEOUS
  Filled 2015-08-11 (×90): qty 1

## 2015-08-11 MED ORDER — ATORVASTATIN CALCIUM 20 MG PO TABS
20.0000 mg | ORAL_TABLET | Freq: Every day | ORAL | Status: DC
  Administered 2015-08-12 – 2015-09-11 (×30): 20 mg via ORAL
  Filled 2015-08-11 (×22): qty 1
  Filled 2015-08-11: qty 2
  Filled 2015-08-11 (×8): qty 1

## 2015-08-11 MED ORDER — PANTOPRAZOLE SODIUM 40 MG PO TBEC: 40.0000 mg | DELAYED_RELEASE_TABLET | Freq: Every day | ORAL | Status: DC

## 2015-08-11 MED ORDER — HEPARIN SODIUM (PORCINE) 5000 UNIT/ML IJ SOLN: 5000.0000 [IU] | Freq: Three times a day (TID) | INTRAMUSCULAR | Status: DC

## 2015-08-11 MED ORDER — KCL IN DEXTROSE-NACL 40-5-0.9 MEQ/L-%-% IV SOLN
INTRAVENOUS | Status: DC
  Filled 2015-08-11 (×2): qty 1000

## 2015-08-11 MED ORDER — SORBITOL 70 % SOLN
30.0000 mL | Freq: Every day | Status: DC | PRN
Start: 2015-08-11 — End: 2015-09-12
  Administered 2015-09-02: 30 mL via ORAL
  Filled 2015-08-11: qty 30

## 2015-08-11 MED ORDER — RISAQUAD PO CAPS
1.0000 | ORAL_CAPSULE | Freq: Two times a day (BID) | ORAL | Status: DC
  Administered 2015-08-12 – 2015-09-12 (×55): 1 via ORAL
  Filled 2015-08-11 (×66): qty 1

## 2015-08-11 MED ORDER — ONDANSETRON HCL 4 MG/2ML IJ SOLN: 4.0000 mg | Freq: Four times a day (QID) | INTRAMUSCULAR | Status: DC | PRN

## 2015-08-11 MED ORDER — ACETAMINOPHEN 650 MG RE SUPP: 650.0000 mg | RECTAL | Status: DC | PRN

## 2015-08-11 MED ORDER — RESOURCE THICKENUP CLEAR PO POWD
ORAL | Status: DC | PRN
  Filled 2015-08-11: qty 125

## 2015-08-11 MED ORDER — POLYVINYL ALCOHOL 1.4 % OP SOLN
1.0000 [drp] | OPHTHALMIC | Status: DC | PRN
  Filled 2015-08-11: qty 15

## 2015-08-11 MED ORDER — LATANOPROST 0.005 % OP SOLN
1.0000 [drp] | Freq: Every day | OPHTHALMIC | Status: DC
  Administered 2015-08-11 – 2015-09-11 (×32): 1 [drp] via OPHTHALMIC
  Filled 2015-08-11: qty 2.5

## 2015-08-11 MED ORDER — OLOPATADINE HCL 0.1 % OP SOLN
1.0000 [drp] | Freq: Two times a day (BID) | OPHTHALMIC | Status: DC | PRN
  Filled 2015-08-11: qty 5

## 2015-08-11 MED ORDER — SODIUM CHLORIDE 0.9 % IV SOLN
500.0000 mg | Freq: Two times a day (BID) | INTRAVENOUS | Status: DC
  Administered 2015-08-11 – 2015-08-14 (×7): 500 mg via INTRAVENOUS
  Filled 2015-08-11 (×8): qty 5

## 2015-08-11 MED ORDER — ESTROGENS CONJUGATED 0.3 MG PO TABS
0.3000 mg | ORAL_TABLET | ORAL | Status: DC
  Administered 2015-08-13 – 2015-09-10 (×13): 0.3 mg via ORAL
  Filled 2015-08-11 (×17): qty 1

## 2015-08-11 MED ORDER — ONDANSETRON HCL 4 MG PO TABS
4.0000 mg | ORAL_TABLET | Freq: Four times a day (QID) | ORAL | Status: DC | PRN
  Administered 2015-09-01 – 2015-09-11 (×3): 4 mg via ORAL
  Filled 2015-08-11 (×3): qty 1

## 2015-08-11 NOTE — Progress Notes (Signed)
Rehab admissions - I met with patient and Dr. London Pepper at the bedside.  I spoke with attending by phone.  Bed available on rehab and will plan to admit to acute inpatient rehab today.  Call me for questions.  #[phone]

## 2015-08-11 NOTE — H&P (Signed)
Physical Medicine and Rehabilitation Admission H&P    Chief Complaint  Patient presents with  . Code Stroke  : HPI: [name] is a 75 y.o. right handed female with history of hypertension. Presented 08/01/2015 with acute onset of aphasia and right-sided weakness. Patient lives with spouse independent prior to admission. Husband is a retired Field se[employer]ologist. Blood pressure 120/106. CT of the head and imaging revealed large left basal ganglia hemorrhage with no extension into the left lateral ventricle. MRI of the brain showing increased size of acute intraparenchymal hemorrhage centered at the left basal ganglion measuring 5.6 x 3.3 x 3.6 cm. Associated localized vasogenic edema and mass effect. No hydrocephalus. MRA of the head with no vascular abnormality or aneurysm underlying the left basal ganglia hemorrhage. No larger proximal arterial branch occlusion. Neurology follow-up conservative care and close monitoring of blood pressure. Follow-up CT of the head 08/08/2015 due to increased somnolence that showed left hemisphere hyperdense intra-axial hemorrhage stable to slightly increased from 08/06/2015. Surrounding edema and regional mass effect stable, including mild rightward midline shift of 4 mm. No new intracranial abnormality. EEG 08/09/2015 showed  Mild focal slowing in the left temporal region with rare intermittent left frontotemporal abnormal epileptiform discharges in the form of sharps with phase reversals noted. She was placed on Keppra 500 mg twice a day 08/09/2015. Currently on a dysphagia #2 honey thick liquid diet after modified barium swallow. Subcutaneous heparin for DVT prophylaxis added 08/09/2015.  Bouts of agitation with Seroquel added but later discontinued secondary to somnolence. Mild leukocytosis 13,900 with urinalysis pending. Physical occupational therapy evaluations completed with recommendations of physical medicine rehabilitation consult. Patient was admitted for  a comprehensive rehabilitation program  ROS Review of Systems  Unable to perform ROS:  mental condition   Past Medical History  Diagnosis Date  . Bronchitis, not specified as acute or chronic   . Other chronic sinusitis   . Other chronic allergic conjunctivitis   . Allergic rhinitis, cause unspecified   . 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 Acute renal insufficiency     due to amphotericin 2012  . Irritable bowel   . Hypertension   . Diverticulosis    Past Surgical History  Procedure Laterality Date  . Cesarean section      x 4  . Vesicovaginal fistula closure w/ tah    . Breast lumpectomy      Right-benign  . Tonsillectomy    . Appendectomy    . Bronchoscopy      Video bronch w/ TBBX 2012   Family History  Problem Relation Age of Onset  . Aortic aneurysm Father   . Heart failure Mother    Social History:  reports that she has never smoked. She has never used smokeless tobacco. She reports that she drinks alcohol. Her drug history is not on file. Allergies:  Allergies  Allergen Reactions  . Penicillins Hives and Rash    Has patient had a PCN reaction causing immediate rash, facial/tongue/throat swelling, SOB or lightheadedness with hypotension: Yes Has patient had a PCN reaction causing severe rash involving mucus membranes or skin necrosis: No Has patient had a PCN reaction that required hospitalization unknown Has patient had a PCN reaction occurring within the last 10 years: No If all of the above answers are "NO", then may proceed with Cephalosporin use.   Medications Prior to Admission  Medication Sig Dispense Refill  . atorvastatin (LIPITOR) 20 MG tablet Take 20 mg  by mouth at bedtime.   3  . bimatoprost (LUMIGAN) 0.01 % SOLN Place 1 drop into both eyes at bedtime.    . cetirizine (ZYRTEC) 10 MG tablet Take 10 mg by mouth daily as needed (seasonal allergies).    Marland Kitchen estrogens, conjugated, (PREMARIN) 0.3 MG tablet Take 0.3 mg by mouth every  other day.     . fluticasone (FLONASE) 50 MCG/ACT nasal spray Place 1 spray into both nostrils daily as needed (seasonal allergies).    . losartan (COZAAR) 100 MG tablet Take 100 mg by mouth at bedtime.   3  . Olopatadine HCl (PATADAY) 0.2 % SOLN Place 1 drop into both eyes every 12 (twelve) hours as needed (seasonal allergies).    Vladimir Faster Glycol-Propyl Glycol (SYSTANE OP) Place 1 drop into both eyes daily as needed (dry eyes).    . Probiotic Product (ALIGN) 4 MG CAPS Take 4 mg by mouth 2 (two) times daily.    Marland Kitchen HYDROcodone-acetaminophen (NORCO) 5-325 MG per tablet Take 1 tablet by mouth every 4 (four) hours as needed for pain. (Patient not taking: Reported on 08/02/2015) 20 tablet 0  . ondansetron (ZOFRAN) 8 MG tablet Take 1 tablet (8 mg total) by mouth every 8 (eight) hours as needed for nausea. (Patient not taking: Reported on 08/02/2015) 16 tablet 0    Home: Home Living Family/patient expects to be discharged to:: Private residence Living Arrangements: Spouse/significant other Available Help at Discharge: Family, Available 24 hours/day Type of Home: House Home Access: Stairs to enter [employer] of Steps: 1 + 1 Home Layout: Two level, 1/2 bath on main level Alternate Level Stairs-Number of Steps: full flight  Bathroom Toilet: Standard Home Equipment: None (?) Additional Comments: .  Family can hire assist if needed.  house has the capability of having an elevator installed - sons are contractors and can make the renovations   Lives With: Spouse   Functional History: Prior Function Level of Independence: Independent  Functional Status:  Mobility: Bed Mobility Overal bed mobility: Needs Assistance Bed Mobility: Supine to Sit Supine to sit: Mod assist, HOB elevated (sit>supine requires max assist!) General bed mobility comments: able to follow commands with tactile cues to roll to right with 1 hand assist and push up on rail to sitting; less control into supine  requiring therapist to lift legs and pivot body for patient to return to bed surface Transfers Overall transfer level: Needs assistance Equipment used: 1 person hand held assist Transfers: Sit to/from Stand Sit to Stand: Max assist Stand pivot transfers: Mod assist Squat pivot transfers: Max assist, From elevated surface General transfer comment: total assist to support right side and prevent collapse to right, does attempt to stand tall vs. leaning dependently on therapist; left side assists Ambulation/Gait Ambulation/Gait assistance:  (unable to effectively wt shift to unload feet for pregait) General Gait Details: unable without +2 assist    ADL: ADL Overall ADL's : Needs assistance/impaired Eating/Feeding: Minimal assistance, Bed level Grooming: Wash/dry hands, Wash/dry face, Minimal assistance, Sitting Upper Body Bathing: Minimal assitance, Sitting Lower Body Bathing: Moderate assistance, Sit to/from stand Upper Body Dressing : Maximal assistance, Sitting Lower Body Dressing: Sit to/from stand, Maximal assistance Lower Body Dressing Details (indicate cue type and reason): Pt able to don socks with min A and doff with min guard assist  Toilet Transfer: Moderate assistance, Squat-pivot, BSC Toileting- Clothing Manipulation and Hygiene: Maximal assistance, Sit to/from stand Functional mobility during ADLs: Moderate assistance General ADL Comments: Pt appears very motivated  Cognition: Cognition Overall Cognitive Status: Difficult to assess Arousal/Alertness: Lethargic Orientation Level: Other (comment) Attention: Focused, Sustained Focused Attention: Appears intact (intermittently in tact) Sustained Attention: Impaired Sustained Attention Impairment: Verbal basic, Functional basic (able to sustain attention to basic tasks for 10-20 seconds) Problem Solving:  (demosntrates some basic functional problem solving) Cognition Arousal/Alertness: Lethargic (awakens to voice,  sustains with activity) Behavior During Therapy: Flat affect (winks or sustains eye contact, does try to speak once) Overall Cognitive Status: Difficult to assess Area of Impairment: Attention, Following commands, Awareness Current Attention Level: Focused Following Commands: Follows one step commands inconsistently Safety/Judgement: Decreased awareness of safety, Decreased awareness of deficits Awareness: Intellectual Problem Solving: Decreased initiation, Requires verbal cues, Requires tactile cues General Comments: less lethargic than notes from previous day reflect, sleeping soundly on arrival, able to around with voice, and sustains arousal with mobility/activity Difficult to assess due to: Impaired communication  Physical Exam: Blood pressure 148/69, pulse 66, temperature 98.2 F (36.8 C), temperature source Axillary, resp. rate 20, height 5' 4" (1.626 m), weight 64.5 kg (142 lb 3.2 oz), SpO2 99 %. Physical Exam Constitutional: She appears well-developed, well nourished. NAD. HENT:  Head: Normocephalic. Atraumatic.  Eyes:  Pupils reactive to light  Neck: Normal range of motion. Neck supple. No thyromegaly present.  Cardiovascular: Normal rate and regular rhythm. + Murmur Respiratory: Effort normal and breath sounds normal. No respiratory distress.  GI: Soft. Bowel sounds are normal. She exhibits no distension.  Neurological:  Lethargic, unable to arouse.  DTRs 3+ right upper and right lower extremity Unable to assess sensation. Motor:  Right upper extremity and right lower extremity appear to be 0/5 Left upper and left lower extremity greater than or equal to 3/5 (patient not participating in MMT) + Babinski bilaterally. + Clonus right lower extremity - Hoffman's Increased flexor tone in the right upper extremity Skin: Skin is warm and dry.   Results for orders placed or performed during the hospital encounter of 08/01/15 (from the past 48 hour(s))  CBC     Status:  Abnormal   Collection Time: 08/10/15  5:50 AM  Result Value Ref Range   WBC 11.9 (H) 4.0 - 10.5 K/uL   RBC 3.99 3.87 - 5.11 MIL/uL   Hemoglobin 12.0 12.0 - 15.0 g/dL   HCT 34.9 (L) 36.0 - 46.0 %   MCV 87.5 78.0 - 100.0 fL   MCH 30.1 26.0 - 34.0 pg   MCHC 34.4 30.0 - 36.0 g/dL   RDW 13.5 11.5 - 15.5 %   Platelets 282 150 - 400 K/uL  Basic metabolic panel     Status: Abnormal   Collection Time: 08/10/15  5:50 AM  Result Value Ref Range   Sodium 136 135 - 145 mmol/L   Potassium 3.0 (L) 3.5 - 5.1 mmol/L   Chloride 105 101 - 111 mmol/L   CO2 16 (L) 22 - 32 mmol/L   Glucose, Bld 81 65 - 99 mg/dL   BUN 10 6 - 20 mg/dL   Creatinine, Ser 0.66 0.44 - 1.00 mg/dL   Calcium 8.7 (L) 8.9 - 10.3 mg/dL   GFR calc non Af Amer >60 >60 mL/min   GFR calc Af Amer >60 >60 mL/min    Comment: (NOTE) The eGFR has been calculated using the CKD EPI equation. This calculation has not been validated in all clinical situations. eGFR's persistently <60 mL/min signify possible Chronic Kidney Disease.    Anion gap 15 5 - 15  CBC     Status:  Abnormal   Collection Time: 08/11/15  3:54 AM  Result Value Ref Range   WBC 13.9 (H) 4.0 - 10.5 K/uL   RBC 4.01 3.87 - 5.11 MIL/uL   Hemoglobin 12.1 12.0 - 15.0 g/dL   HCT 35.3 (L) 36.0 - 46.0 %   MCV 88.0 78.0 - 100.0 fL   MCH 30.2 26.0 - 34.0 pg   MCHC 34.3 30.0 - 36.0 g/dL   RDW 13.7 11.5 - 15.5 %   Platelets 298 150 - 400 K/uL  Basic metabolic panel     Status: Abnormal   Collection Time: 08/11/15  3:54 AM  Result Value Ref Range   Sodium 137 135 - 145 mmol/L   Potassium 3.6 3.5 - 5.1 mmol/L   Chloride 105 101 - 111 mmol/L   CO2 16 (L) 22 - 32 mmol/L   Glucose, Bld 80 65 - 99 mg/dL   BUN 12 6 - 20 mg/dL   Creatinine, Ser 0.72 0.44 - 1.00 mg/dL   Calcium 8.8 (L) 8.9 - 10.3 mg/dL   GFR calc non Af Amer >60 >60 mL/min   GFR calc Af Amer >60 >60 mL/min    Comment: (NOTE) The eGFR has been calculated using the CKD EPI equation. This calculation has not  been validated in all clinical situations. eGFR's persistently <60 mL/min signify possible Chronic Kidney Disease.    Anion gap 16 (H) 5 - 15  Magnesium     Status: None   Collection Time: 08/11/15  3:54 AM  Result Value Ref Range   Magnesium 1.8 1.7 - 2.4 mg/dL   Dg Chest Port 1 View  08/09/2015  CLINICAL DATA:  Leukocytosis. EXAM: PORTABLE CHEST 1 VIEW COMPARISON:  08/06/2015 FINDINGS: The heart size and mediastinal contours are within normal limits. Both lungs are clear. No pleural effusion or pneumothorax. The visualized skeletal structures are unremarkable. IMPRESSION: No active disease. Electronically Signed   By: Lajean Manes M.D.   On: 08/09/2015 09:40       Medical Problem List and Plan: 1.  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  DVT Prophylaxis/Anticoagulation: Subcutaneous heparin initiated 08/09/2015 3. Pain Management: Hydrocodone as needed 4. Dysphagia. Dysphagia #2 honey thick liquids. IV fluids for hydration. Follow-up speech therapy 5. Neuropsych: This patient is not capable of making decisions on her own behalf. 6. Skin/Wound Care: Routine skin checks 7. Decreased nutritional storage. Dietary follow-up. Check calorie counts 8. Fluids/Electrolytes/Nutrition: Routine I&O's with follow-up chemistries 9. Seizure prophylaxis. Keppra 500 mg twice a day 10. Leukocytosis. Follow-up urine study/follow-up CBC 11. Hypertension. Cozaar 100 mg daily. Monitor with increased mobility 12. Hyperlipidemia. Lipitor 13. Lethargy post stroke: We'll consider neuro stimulant  Post Admission Physician Evaluation: 1. Functional deficits secondary  to left basal ganglia hypertensive intracranial hemorrhage. 2. Patient is admitted to receive collaborative, interdisciplinary care between the physiatrist, rehab nursing staff, and therapy team. 3. Patient's level of medical complexity and substantial therapy needs in  context of that medical necessity cannot be provided at a lesser intensity of care such as a SNF. 4. Patient has experienced substantial functional loss from his/her baseline which was documented above under the "Functional History" and "Functional Status" headings.  Judging by the patient's diagnosis, physical exam, and functional history, the patient has potential for functional progress which will result in measurable gains while on inpatient rehab.  These gains will be of substantial and practical use upon discharge  in facilitating mobility and self-care at the household level.  5. Physiatrist will provide 24 hour management of medical needs as well as oversight of the therapy plan/treatment and provide guidance as appropriate regarding the interaction of the two. 6. 24 hour rehab nursing will assist with bladder management, bowel management, safety, skin/wound care, disease management, medication administration, pain management and patient education and help integrate therapy concepts, techniques,education, etc. 7. PT will assess and treat for/with: Lower extremity strength, range of motion, stamina, balance, functional mobility, safety, adaptive techniques and equipment, woundcare, coping skills, pain control, education.   Goals are: Mod/min A. 8. OT will assess and treat for/with: ADL's, functional mobility, safety, upper extremity strength, adaptive techniques and equipment, wound mgt, ego support, and community reintegration.   Goals are: Mod/min A. Therapy may proceed with showering this patient. 9. SLP will assess and treat for/with: Speech, language, swallowing, cognition.  Goals are: Mod A. 10. Case Management and Social Worker will assess and treat for psychological issues and discharge planning. 11. Team conference will be held weekly to assess progress toward goals and to determine barriers to discharge. 12. Patient will receive at least 3 hours of therapy per day at least 5 days per  week. 13. ELOS: 24-30 days       14. Prognosis:  fair  Delice Lesch, MD 08/11/2015

## 2015-08-11 NOTE — Progress Notes (Signed)
Patients son came to visit.  No change in patients LOC during visit.  Informed son a little on what rehab does.  Will continue to monitor. Son is concerned that patient hasn't eaten in approximately 10 days.  He says he hopes a feeding tube gets placed soon for nourishment, even if she pulls it out. Brita Romp, RN

## 2015-08-11 NOTE — Interval H&P Note (Signed)
[name] was admitted today to Inpatient Rehabilitation with the diagnosis of left basal ganglia hypertensive intracranial hemorrhage.  The patient's history has been reviewed, patient examined, and there is no change in status.  Patient continues to be appropriate for intensive inpatient rehabilitation.  I have reviewed the patient's chart and labs.  Questions were answered to the patient's satisfaction. The PAPE has been reviewed and assessment remains appropriate.  Ankit Lorie Phenix 08/11/2015, 3:40 PM

## 2015-08-11 NOTE — Progress Notes (Signed)
Retta Diones, RN Rehab Admission Coordinator Signed Physical Medicine and Rehabilitation PMR Pre-admission 08/08/2015 9:55 AM  Related encounter: ED to Hosp-Admission (Discharged) from 08/01/2015 in [place] Collapse All   PMR Admission Coordinator Pre-Admission Assessment  Patient: [name] is an 75 y.o., female MRN: [id number] DOB: 01/28/1941 Height: 5\' 4"  (162.6 cm) Weight: 64.5 kg (142 lb 3.2 oz)  Insurance Information HMO: PPO: PCP: IPA: 80/20: OTHER:  PRIMARY: Medicare A & B Policy#: [id number] Subscriber: Self CM Name: Phone#: Fax#:  Pre-Cert#: Eligible per Passport online Portal Employer: Not employed  Benefits: Phone #: Name:  Eff. Date: 10/17/05 Deduct: $1316.00 Out of Pocket Max: None Life Max: Unlimited CIR: 100% SNF: 100% days 1-20; 80% days 21-100 Outpatient: 80% Co-Pay: 20% Home Health: 100% Co-Pay: None DME: 80% Co-Pay: 20% Providers: patient's choice  SECONDARY: AARP Policy#: [id number] Subscriber: Self CM Name: Phone#: Fax#:  Pre-Cert#: Employer:  Benefits: Phone #: Name:  Eff. Date: Deduct: Out of Pocket Max: Life Max:  CIR: SNF: Outpatient: Co-Pay:  Home Health: Co-Pay:  DME: Co-Pay:   Medicaid Application Date: Case Manager:  Disability Application Date: Case Worker:   Emergency Contact Information Contact Information    Name Relation Home Work [place] [phone]   Gillermina Phy Son      [place],Scott Son      [place]. 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  History of Present Illness: [name] is a 75 y.o. right handed female with history of hypertension. Presented 08/01/2015 with acute onset of aphasia and right-sided weakness. Patient lives with spouse independent prior to admission. Husband is a retired Field seismologist. Blood pressure 120/106. CT of the head and imaging revealed large left basal ganglia hemorrhage with no extension into the left lateral ventricle. MRI of the brain showing increased size of acute intraparenchymal hemorrhage centered at the left basal ganglion measuring 5.6 x 3.3 x 3.6 cm. Associated localized vasogenic edema and mass effect. No hydrocephalus. MRA of the head with no vascular abnormality or aneurysm underlying the left basal ganglia hemorrhage. No larger proximal arterial branch occlusion. Neurology follow-up conservative care and close monitoring of blood pressure. Follow-up CT of the head 08/05/2015 unchanged. EEG 08/06/2015 showed no seizure activity. Currently on a dysphagia 2 honey-thick liquid diet after a modified barium swallow study. Suspect UTI maintained on Cipro. Bouts of agitation with Seroquel added. Physical occupational therapy evaluations completed with recommendations of physical medicine rehabilitation consult. Patient to be admitted for comprehensive inpatient rehabilitation program.  NIH= Total: 17   Past Medical History  Past Medical History  Diagnosis Date  . Bronchitis, not specified as acute or chronic   . Other chronic sinusitis   . Other chronic allergic conjunctivitis   . Allergic rhinitis, cause unspecified   . 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 Acute renal insufficiency     due to amphotericin 2012  . Irritable bowel     . Hypertension   . Diverticulosis    Family History  family history includes Aortic aneurysm in her father; Heart failure in her mother.  Prior Rehab/Hospitalizations:  Has the patient had major surgery during 100 days prior to  admission? No  Current Medications   Current facility-administered medications:  . 0.9 % NaCl with KCl 40 mEq / L infusion, , Intravenous, Continuous, David L Rinehuls, PA-C, Last Rate: 50 mL/hr at 08/10/15 1642, 50 mL/hr at 08/10/15 1642 . acetaminophen (TYLENOL) tablet 650 mg, 650 mg, Oral, Q4H PRN **OR** acetaminophen (TYLENOL) suppository 650 mg, 650 mg, Rectal, Q4H PRN, Wallie Char . acidophilus (RISAQUAD) capsule 1 capsule, 1 capsule, Oral, BID, Garvin Fila, MD, 1 capsule at 08/07/15 2118 . antiseptic oral rinse (CPC / CETYLPYRIDINIUM CHLORIDE 0.05%) solution 7 mL, 7 mL, Mouth Rinse, BID, Wallie Char, 7 mL at 08/11/15 1028 . atorvastatin (LIPITOR) tablet 20 mg, 20 mg, Oral, QHS, Garvin Fila, MD, 20 mg at 08/07/15 2117 . estrogens (conjugated) (PREMARIN) tablet 0.3 mg, 0.3 mg, Oral, QODAY, Garvin Fila, MD, 0.3 mg at 08/11/15 1012 . fluticasone (FLONASE) 50 MCG/ACT nasal spray 1 spray, 1 spray, Each Nare, Daily PRN, Garvin Fila, MD . heparin injection 5,000 Units, 5,000 Units, Subcutaneous, Q8H, Rosalin Hawking, MD, 5,000 Units at 08/10/15 2206 . hydrALAZINE (APRESOLINE) injection 10 mg, 10 mg, Intravenous, Q15 min PRN, Donzetta Starch, NP, 10 mg at 08/07/15 2142 . HYDROcodone-acetaminophen (NORCO/VICODIN) 5-325 MG per tablet 1 tablet, 1 tablet, Oral, Q4H PRN, Garvin Fila, MD . labetalol (NORMODYNE,TRANDATE) injection 10-40 mg, 10-40 mg, Intravenous, Q10 min PRN, Donzetta Starch, NP, 10 mg at 08/05/15 1455 . latanoprost (XALATAN) 0.005 % ophthalmic solution 1 drop, 1 drop, Both Eyes, QHS, Catha Gosselin, MD, 1 drop at 08/10/15 2219 . levETIRAcetam (KEPPRA) tablet 500 mg, 500 mg, Oral, BID, Rosalin Hawking, MD, 500 mg at 08/10/15  2211 . loratadine (CLARITIN) tablet 10 mg, 10 mg, Oral, Daily, Garvin Fila, MD, 10 mg at 08/05/15 1648 . losartan (COZAAR) tablet 100 mg, 100 mg, Oral, Daily, Catha Gosselin, MD, 100 mg at 08/05/15 1018 . olopatadine (PATANOL) 0.1 % ophthalmic solution 1 drop, 1 drop, Both Eyes, BID PRN, Wallie Char . ondansetron (ZOFRAN) injection 4 mg, 4 mg, Intramuscular, Q6H PRN, Catha Gosselin, MD . ondansetron [facility]) tablet 8 mg, 8 mg, Oral, Q8H PRN, Garvin Fila, MD . pantoprazole (PROTONIX) EC tablet 40 mg, 40 mg, Oral, QHS, Valeda Malm Rumbarger, RPH, 40 mg at 08/10/15 2213 . polyvinyl alcohol (LIQUIFILM TEARS) 1.4 % ophthalmic solution 1 drop, 1 drop, Both Eyes, PRN, Catha Gosselin, MD . RESOURCE THICKENUP CLEAR, , Oral, PRN, Garvin Fila, MD  Patients Current Diet: DIET DYS 2 Room service appropriate?: Yes; Fluid consistency:: Honey Thick  Precautions / Restrictions Precautions Precautions: Fall Precaution Comments: right sided weakness, impulsivity.  Restrictions Weight Bearing Restrictions: No   Has the patient had 2 or more falls or a fall with injury in the past year?No  Prior Activity Level Community (5-7x/wk): Patient was completely independent and active prior to admission. Patient was out daily, enjoyes to travel, play bridge and exercised daily.   Home Assistive Devices / Equipment Home Assistive Devices/Equipment: None Home Equipment: None (?)  Prior Device Use: Indicate devices/aids used by the patient prior to current illness, exacerbation or injury? None of the above  Prior Functional Level Prior Function Level of Independence: Independent  Self Care: Did the patient need help bathing, dressing, using the toilet or eating? Independent  Indoor Mobility: Did the patient need assistance with walking from room to room (with or without device)? Independent  Stairs: Did the patient need assistance with internal or external stairs (with or without  device)? Independent  Functional Cognition: Did the patient need help planning regular tasks such as shopping or remembering to take medications? Independent  Current Functional Level Cognition  Arousal/Alertness: Lethargic Overall Cognitive Status: Difficult to assess Difficult to assess due to: Impaired communication Current Attention Level: Focused Orientation Level: Other (comment) (nonverbal) Following Commands: Follows one step commands inconsistently Safety/Judgement: Decreased awareness of safety, Decreased awareness of deficits General Comments: less lethargic than notes from previous day reflect, sleeping soundly on arrival, able to around with voice, and sustains arousal with mobility/activity Attention: Focused, Sustained Focused Attention: Appears intact (intermittently in tact) Sustained Attention: Impaired Sustained Attention Impairment: Verbal basic, Functional basic (able to sustain attention to basic tasks for 10-20 seconds) Problem Solving: (demosntrates some basic functional problem solving)   Extremity Assessment (includes Sensation/Coordination)  Upper Extremity Assessment: Defer to OT evaluation, RUE deficits/detail RUE Deficits / Details: does not spontaneously move RUE, noting similar tone to previous assessment, and occasionally attempts to reposition RUE with LUE RUE Coordination: decreased fine motor, decreased gross motor  Lower Extremity Assessment: RLE deficits/detail RLE Deficits / Details: able to bear min-mod weight with support in standing (moves left foot to assist with donning socks) RLE Coordination: decreased gross motor    ADLs  Overall ADL's : Needs assistance/impaired Eating/Feeding: Minimal assistance, Bed level Grooming: Wash/dry hands, Wash/dry face, Minimal assistance, Sitting Upper Body Bathing: Minimal assitance, Sitting Lower Body Bathing: Moderate assistance, Sit to/from stand Upper Body Dressing : Maximal assistance,  Sitting Lower Body Dressing: Sit to/from stand, Maximal assistance Lower Body Dressing Details (indicate cue type and reason): Pt able to don socks with min A and doff with min guard assist  Toilet Transfer: Moderate assistance, Squat-pivot, BSC Toileting- Clothing Manipulation and Hygiene: Maximal assistance, Sit to/from stand Functional mobility during ADLs: Moderate assistance General ADL Comments: Pt appears very motivated     Mobility  Overal bed mobility: Needs Assistance Bed Mobility: Supine to Sit Supine to sit: Mod assist, HOB elevated (sit>supine requires max assist!) General bed mobility comments: able to follow commands with tactile cues to roll to right with 1 hand assist and push up on rail to sitting; less control into supine requiring therapist to lift legs and pivot body for patient to return to bed surface    Transfers  Overall transfer level: Needs assistance Equipment used: 1 person hand held assist Transfers: Sit to/from Stand Sit to Stand: Max assist Stand pivot transfers: Mod assist Squat pivot transfers: Max assist, From elevated surface General transfer comment: total assist to support right side and prevent collapse to right, does attempt to stand tall vs. leaning dependently on therapist; left side assists    Ambulation / Gait / Stairs / Wheelchair Mobility  Ambulation/Gait Ambulation/Gait assistance: (unable to effectively wt shift to unload feet for pregait) General Gait Details: unable without +2 assist    Posture / Balance Dynamic Sitting Balance Sitting balance - Comments: pt leans heavily to right, requires total assist at trunk to prevent fall to right, not able to hold head up; LUE reflexively props when perturbated to left by therapist Balance Overall balance assessment: Needs assistance Sitting-balance support: Single extremity supported, Feet supported Sitting balance-Leahy Scale: Zero Sitting balance - Comments: pt leans heavily  to right, requires total assist at trunk to prevent fall to right, not able to hold head up; LUE reflexively props when perturbated to left by therapist Postural control: Right lateral lean Standing balance support: No upper extremity supported, During functional activity Standing balance-Leahy Scale: Zero Standing balance comment: total  assist at R (see sit<>stand comments); stood ~20-30 seconds total, at no time did pt achieve midline and depended on external support to prevent fall    Special needs/care consideration BiPAP/CPAP: No CPM: No Continuous Drip IV: No Dialysis: no  Life Vest: No Oxygen: No Special Bed: No Trach Size: No Wound Vac (area): No  Skin: Dry but in defined limits  Bowel mgmt: 4/21 Bladder mgmt: incontinent  Diabetic mgmt: N/A    Previous Home Environment Living Arrangements: Spouse/significant other Lives With: Spouse Available Help at Discharge: Family, Available 24 hours/day Type of Home: House Home Layout: Two level, 1/2 bath on main level Alternate Level Stairs-Number of Steps: full flight  Home Access: Stairs to enter Entrance Stairs-Number of Steps: 1 + 1 Bathroom Toilet: [place]: No Additional Comments: . Family can hire assist if needed. house has the capability of having an elevator installed - sons are contractors and can make the renovations   Discharge Living Setting Plans for Discharge Living Setting: Lives with (comment) (spouse location to be determined based on patient's needs. ) Discharge Bathroom Shower/Tub: Other (comment) (Tell the spouse and son, Merry Proud what is recommended ) Does the patient have any problems obtaining your medications?: No  Social/Family/Support Systems Patient Roles: Spouse, Parent Anticipated Caregiver: Spouse: Dr. Genelle Bal Anticipated Caregiver's Contact Information: [phone] Ability/Limitations of Caregiver: None Caregiver  Availability: 24/7 Discharge Plan Discussed with Primary Caregiver: Yes Is Caregiver In Agreement with Plan?: Yes Does Caregiver/Family have Issues with Lodging/Transportation while Pt is in Rehab?: No  Goals/Additional Needs Patient/Family Goal for Rehab: SLP/PT/OT Min assist  Expected length of stay: 3-4 weeks per consult  Cultural Considerations: None Dietary Needs: Dys.2 textures and honey-thick liquids  Equipment Needs: TBD Special Service Needs: None Additional Information: Family open to recommendations for home set up needs, they will customize with notice Pt/Family Agrees to Admission and willing to participate: Yes Program Orientation Provided & Reviewed with Pt/Caregiver Including Roles & Responsibilities: Yes Additional Information Needs: Verbal report of dischrage plans provided to CSW Information Needs to be Provided By: CSW  Decrease burden of Care through IP rehab admission: No  Possible need for SNF placement upon discharge: Not anticipated at this time  Patient Condition: This patient's medical and functional status has changed since the consult dated: 08/06/15 in which the Rehabilitation Physician determined and documented that the patient's condition is appropriate for intensive rehabilitative care in an inpatient rehabilitation facility. See "History of Present Illness" (above) for medical update. Functional changes are: Per most recent PT note "Patient responding to questions as best she can with thumbs up/down and head nods. She was able to participate in a functional ADL task at the sink in standing and repeated multiple sit to stands." Patient's medical and functional status update has been discussed with the Rehabilitation physician and patient remains appropriate for inpatient rehabilitation. Will admit to inpatient rehab today.  Preadmission Screen Completed By: Retta Diones, 08/11/2015 11:19  AM ______________________________________________________________________  Discussed status with Dr. Posey Pronto on 08/11/15 at 1123 and received telephone approval for admission today.  Admission Coordinator: Chauncey Cruel 08/11/15          Cosigned by: Ankit Lorie Phenix, MD at 08/11/2015 12:16 PM  Revision History     Date/Time User Provider Type Action   08/11/2015 12:16 PM Ankit Lorie Phenix, MD Physician Cosign   08/11/2015 12:00 PM Retta Diones, RN Rehab Admission Coordinator Sign   08/11/2015 12:00 PM Retta Diones, RN Rehab Admission  Coordinator Incomplete Revision   08/11/2015 11:24 AM Retta Diones, RN Rehab Admission Coordinator Sign   08/11/2015 8:31 AM Gunnar Fusi Rehab Admission Coordinator Share   View Details Report

## 2015-08-11 NOTE — Progress Notes (Signed)
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 Leonie Man   HPI: [name] is a 75 y.o. right handed female with history of hypertension. Presented 08/01/2015 with acute onset of aphasia and right-sided weakness. Patient lives with spouse independent prior to admission. Husband is a retired Field seismologist. Blood pressure 120/106. CT of the head and imaging revealed large left basal ganglia hemorrhage with no extension into the left lateral ventricle. MRI of the brain showing increased size of acute intraparenchymal hemorrhage centered at the left basal ganglion measuring 5.6 x 3.3 x 3.6 cm. Associated localized vasogenic edema and mass effect. No hydrocephalus. MRA of the head with no vascular abnormality or aneurysm underlying the left basal ganglia hemorrhage. No larger proximal arterial branch occlusion. Neurology follow-up conservative care and close monitoring of blood pressure. Follow-up CT of the head 08/05/2015 unchanged. Currently on a dysphagia #2 honey thick liquid diet after modified barium swallow. Suspect UTI currently maintained on Cipro. Bouts of agitation with Seroquel added. Physical occupational therapy evaluations completed with recommendations of physical medicine rehabilitation consult.  Spoke to OT patient was more responsive yesterday was able to do thumbs-up or thumbs down. Was pushing with the left lower extremity. This morning has been pushing away food refusing pills  Review of Systems  Unable to perform ROS: language   Past Medical History  Diagnosis Date  . Bronchitis, not specified as  acute or chronic   . Other chronic sinusitis   . Other chronic allergic conjunctivitis   . Allergic rhinitis, cause unspecified   . 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 Acute renal insufficiency     due to amphotericin 2012  . Irritable bowel   . Hypertension   . Diverticulosis    Past Surgical History  Procedure Laterality Date  . Cesarean section      x 4  . Vesicovaginal fistula closure w/ tah    . Breast lumpectomy      Right-benign  . Tonsillectomy    . Appendectomy    . Bronchoscopy      Video bronch w/ TBBX 2012   Family History  Problem Relation Age of Onset  . Aortic aneurysm Father   . Heart failure Mother    Social History:  reports that she has never smoked. She has never used smokeless tobacco. She reports that she drinks alcohol. Her drug history is not on file. Allergies:  Allergies  Allergen Reactions  . Penicillins Hives and Rash    Has patient had a PCN reaction causing immediate rash, facial/tongue/throat swelling, SOB or lightheadedness with hypotension: Yes Has patient had a PCN reaction causing severe rash involving mucus membranes or skin necrosis: No Has patient had a PCN reaction that required hospitalization unknown Has patient had a PCN reaction occurring within the last 10 years: No If all of the above answers are "NO", then may proceed with Cephalosporin use.   Medications Prior to Admission  Medication Sig Dispense Refill  . atorvastatin (LIPITOR) 20 MG tablet Take 20 mg by mouth at bedtime.   3  . bimatoprost (LUMIGAN) 0.01 % SOLN Place 1 drop into both eyes at  bedtime.    . cetirizine (ZYRTEC) 10 MG tablet Take 10 mg by mouth daily as needed (seasonal allergies).    Marland Kitchen estrogens, conjugated, (PREMARIN) 0.3 MG tablet Take 0.3 mg by mouth every other day.     . fluticasone (FLONASE) 50 MCG/ACT nasal spray Place  1 spray into both nostrils daily as needed (seasonal allergies).    . losartan (COZAAR) 100 MG tablet Take 100 mg by mouth at bedtime.   3  . Olopatadine HCl (PATADAY) 0.2 % SOLN Place 1 drop into both eyes every 12 (twelve) hours as needed (seasonal allergies).    Vladimir Faster Glycol-Propyl Glycol (SYSTANE OP) Place 1 drop into both eyes daily as needed (dry eyes).    . Probiotic Product (ALIGN) 4 MG CAPS Take 4 mg by mouth 2 (two) times daily.    Marland Kitchen HYDROcodone-acetaminophen (NORCO) 5-325 MG per tablet Take 1 tablet by mouth every 4 (four) hours as needed for pain. (Patient not taking: Reported on 08/02/2015) 20 tablet 0  . ondansetron (ZOFRAN) 8 MG tablet Take 1 tablet (8 mg total) by mouth every 8 (eight) hours as needed for nausea. (Patient not taking: Reported on 08/02/2015) 16 tablet 0    Home: Home Living Family/patient expects to be discharged to:: Private residence Living Arrangements: Spouse/significant other Available Help at Discharge: Family, Available 24 hours/day Type of Home: House Home Access: Stairs to enter [employer] of Steps: 1 + 1 Home Layout: Two level, 1/2 bath on main level Alternate Level Stairs-Number of Steps: full flight  Bathroom Toilet: Standard Additional Comments: . Family can hire assist if needed. house has the capability of having an elevator installed - sons are contractors and can make the renovations  Lives With: Spouse  Functional History: Prior Function Level of Independence: Independent Functional Status:  Mobility: Bed Mobility Overal bed mobility: Needs Assistance Bed Mobility: Supine to Sit Supine to sit: Min assist General bed mobility comments: Pt hooked Lt foot under right. She requires min A to move LEs off bed and to lift trunk  Transfers Overall transfer level: Needs assistance Equipment used: 1 person hand held assist, 2 person hand held assist Transfers: Sit to/from Stand, Squat  Pivot Transfers Sit to Stand: +2 safety/equipment, Mod assist Squat pivot transfers: Mod assist General transfer comment: Two person mod assist to stand from lower recliner chair to support trunk and block right leg. Assist needed to power up and to control descent to sit.       ADL: ADL Overall ADL's : Needs assistance/impaired Eating/Feeding: Minimal assistance, Bed level Grooming: Wash/dry hands, Wash/dry face, Minimal assistance, Sitting Upper Body Bathing: Minimal assitance, Sitting Lower Body Bathing: Moderate assistance, Sit to/from stand Upper Body Dressing : Maximal assistance, Sitting Lower Body Dressing: Sit to/from stand, Maximal assistance Lower Body Dressing Details (indicate cue type and reason): Pt able to don socks with min A and doff with min guard assist  Toilet Transfer: Moderate assistance, Squat-pivot, BSC Toileting- Clothing Manipulation and Hygiene: Maximal assistance, Sit to/from stand Functional mobility during ADLs: Moderate assistance General ADL Comments: Pt appears very motivated   Cognition: Cognition Overall Cognitive Status: Difficult to assess Arousal/Alertness: Lethargic Orientation Level: Other (comment) (mute) Attention: Focused, Sustained Focused Attention: Appears intact (intermittently in tact) Sustained Attention: Impaired Sustained Attention Impairment: Verbal basic, Functional basic (able to sustain attention to basic tasks for 10-20 seconds) Problem Solving: (demosntrates some basic functional problem solving) Cognition Arousal/Alertness: Awake/alert Behavior During Therapy: Impulsive Overall Cognitive Status: Difficult to assess Area of Impairment:  Attention, Following commands, Safety/judgement, Awareness, Problem solving Current Attention Level: Sustained Following Commands: Follows one step commands inconsistently, Follows one step commands with increased time Safety/Judgement: Decreased awareness of safety, Decreased awareness  of deficits Awareness: Intellectual Problem Solving: Difficulty sequencing, Requires verbal cues, Requires tactile cues, Slow processing General Comments: Pt follows commands with multi modal cuing inconsitently. She is impulsive. Difficult to accurately assess cognition due to severity of language deficits  Difficult to assess due to: Impaired communication  Blood pressure 154/92, pulse 53, temperature 97.5 F (36.4 C), temperature source Oral, resp. rate 14, height 5\' 4"  (1.626 m), weight 64.5 kg (142 lb 3.2 oz), SpO2 97 %. Physical Exam  Constitutional: She appears well-developed.  HENT:  Head: Normocephalic.  Eyes:  Pupils reactive to light  Neck: Normal range of motion. Neck supple. No thyromegaly present.  Cardiovascular: Normal rate and regular rhythm.  Respiratory: Effort normal and breath sounds normal. No respiratory distress.  GI: Soft. Bowel sounds are normal. She exhibits no distension.  Neurological:  Lethargic but arousable. Patient is lying on her right side. She remained nonverbal throughout exam. Inconsistent to follow commands both verbal and demonstrated. Patient appears to have a left gaze preference  Skin: Skin is warm and dry.  Patient has antigravity movement with left upper extremity as well as left lower extremity but could not participate with manual muscle testing. Right upper extremity and right lower extremity did not have any active movement. She did withdraw to pinch in both limbs. Positive Babinski on the right side. Increased flexor tone in the right upper extremity   Lab Results Last 24 Hours    Results for orders placed or performed during the hospital encounter of 08/01/15 (from the past 24 hour(s))  Glucose, capillary Status: None   Collection Time: 08/05/15 8:50 AM  Result Value Ref Range   Glucose-Capillary 85 65 - 99 mg/dL   Comment 1 Notify RN    Comment 2 Document in Chart   Sodium Status: None    Collection Time: 08/05/15 11:18 AM  Result Value Ref Range   Sodium 141 135 - 145 mmol/L  Glucose, capillary Status: None   Collection Time: 08/05/15 12:14 PM  Result Value Ref Range   Glucose-Capillary 99 65 - 99 mg/dL   Comment 1 Notify RN    Comment 2 Document in Chart   Glucose, capillary Status: None   Collection Time: 08/05/15 4:19 PM  Result Value Ref Range   Glucose-Capillary 86 65 - 99 mg/dL   Comment 1 Notify RN    Comment 2 Document in Chart   Glucose, capillary Status: None   Collection Time: 08/05/15 8:00 PM  Result Value Ref Range   Glucose-Capillary 98 65 - 99 mg/dL  Glucose, capillary Status: None   Collection Time: 08/05/15 11:36 PM  Result Value Ref Range   Glucose-Capillary 92 65 - 99 mg/dL  Glucose, capillary Status: None   Collection Time: 08/06/15 3:44 AM  Result Value Ref Range   Glucose-Capillary 94 65 - 99 mg/dL      Imaging Results (Last 48 hours)    Ct Head Wo Contrast  08/05/2015 CLINICAL DATA: Intracranial hemorrhage followup EXAM: CT HEAD WITHOUT CONTRAST TECHNIQUE: Contiguous axial images were obtained from the base of the skull through the vertex without intravenous contrast. COMPARISON: 08/03/2015 FINDINGS: Left basal ganglia hemorrhage unchanged measuring 49 x 27 mm. Surrounding low-density also unchanged. No new area of hemorrhage Mass-effect on the left lateral ventricle unchanged. Mild midline shift to the right  unchanged. Mild atrophy. Mucosal edema in the paranasal sinuses. IMPRESSION: Left basal ganglia hematoma unchanged. There is mass-effect on the ventricular system with mild midline shift unchanged. No new area of hemorrhage or infarction. Electronically Signed By: Franchot Gallo M.D. On: 08/05/2015 10:24   Dg Swallowing Func-speech Pathology  08/04/2015 Objective Swallowing Evaluation: Type of Study: MBS-Modified Barium Swallow Study  Patient Details Name: [name] MRN: [id number] Date of Birth: 1940-12-06 Today's Date: 08/04/2015 Time: SLP Start Time (ACUTE ONLY): 1341-SLP Stop Time (ACUTE ONLY): 1357 SLP Time Calculation (min) (ACUTE ONLY): 16 min Past Medical History: Past Medical History Diagnosis Date . Bronchitis, not specified as acute or chronic . Other chronic sinusitis . Other chronic allergic conjunctivitis . Allergic rhinitis, cause unspecified . 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 Acute renal insufficiency due to amphotericin 2012 . Irritable bowel . Hypertension . Diverticulosis Past Surgical History: Past Surgical History Procedure Laterality Date . Cesarean section x 4 . Vesicovaginal fistula closure w/ tah . Breast lumpectomy Right-benign . Tonsillectomy . Appendectomy . Bronchoscopy Video bronch w/ TBBX 2012 HPI: THUY ROUTT is an 75 y.o. female with a history of hypertension, brought to the emergency room in code stroke status following acute onset of right-sided weakness and speech difficulty. She has no previous history of stroke or TIA. CT scan of her head with a large left basal ganglia hemorrhage. There is localized mass effect and No Data Recorded Assessment / Plan / Recommendation CHL IP CLINICAL IMPRESSIONS 08/04/2015 Therapy Diagnosis Mild pharyngeal phase dysphagia;Moderate pharyngeal phase dysphagia;Mild oral phase dysphagia Clinical Impression MBS complete. Pt exhibited mild-mod pharyngeal phase dysphagia characterized by sensory deficits. Delayed swallow initiation to the vallecula observed across consistencies, with silent penetration to the vocal cords during intake of nectar thick liquids. Due to language deficits/apraxia, max verbal and visual cues were ineffective to elicit cough to clear penetrates. Mild right sided labial residue noted without pt awareness which may result in pocketing due to weakness/apraxia. Pt educated re: results of MBS and diet  recommendation of honey thick liquids and Dysphagia 2 (fine chop) textures, with meds crushed in puree. Scan of esophagus noted stasis in mid esophagus (SLP cannot diagnose impairments below the UES), pt may benefit from esophageal workup. Recommend use of esophageal precautions (sit upright during intake, remain upright at least 30 minutes after intake, alternate solids and liquids). SLP will continue to follow to provide dysphagia tx. Impact on safety and function Moderate aspiration risk CHL IP TREATMENT RECOMMENDATION 08/04/2015 Treatment Recommendations Therapy as outlined in treatment plan below Prognosis 08/04/2015 Prognosis for Safe Diet Advancement Good Barriers to Reach Goals Language deficits Barriers/Prognosis Comment -- CHL IP DIET RECOMMENDATION 08/04/2015 SLP Diet Recommendations Dysphagia 2 (Fine chop) solids;Honey thick liquids Liquid Administration via Cup;No straw Medication Administration Crushed with puree Compensations Minimize environmental distractions;Slow rate;Small sips/bites;Other (Comment);Follow solids with liquid Postural Changes Seated upright at 90 degrees;Remain semi-upright after after feeds/meals (Comment) CHL IP OTHER RECOMMENDATIONS 08/04/2015 Recommended Consults Consider esophageal assessment Oral Care Recommendations Oral care BID Other Recommendations Order thickener from pharmacy;Prohibited food (jello, ice cream, thin soups);Remove water pitcher;Have oral suction available CHL IP FOLLOW UP RECOMMENDATIONS 08/04/2015 Follow up Recommendations Inpatient Rehab CHL IP FREQUENCY AND DURATION 08/04/2015 Speech Therapy Frequency (ACUTE ONLY) min 2x/week Treatment Duration 2 weeks  CHL IP ORAL PHASE 08/04/2015 Oral Phase Impaired Oral - Pudding Teaspoon -- Oral - Pudding Cup -- Oral - Honey Teaspoon -- Oral - Honey Cup (No Data) Oral - Nectar Teaspoon -- Oral - Nectar Cup (No  Data) Oral - Nectar Straw -- Oral - Thin Teaspoon -- Oral - Thin Cup -- Oral - Thin Straw -- Oral -  Puree -- Oral - Mech Soft (No Data) Oral - Regular -- Oral - Multi-Consistency -- Oral - Pill -- Oral Phase - Comment -- CHL IP PHARYNGEAL PHASE 08/04/2015 Pharyngeal Phase Impaired Pharyngeal- Pudding Teaspoon -- Pharyngeal -- Pharyngeal- Pudding Cup -- Pharyngeal -- Pharyngeal- Honey Teaspoon -- Pharyngeal -- Pharyngeal- Honey Cup Pharyngeal residue - valleculae;Reduced tongue base retraction;Delayed swallow initiation-vallecula Pharyngeal -- Pharyngeal- Nectar Teaspoon -- Pharyngeal -- Pharyngeal- Nectar Cup Delayed swallow initiation-vallecula;Reduced tongue base retraction;Pharyngeal residue - valleculae;Penetration/Aspiration during swallow Pharyngeal Material enters airway, CONTACTS cords and not ejected out;Material enters airway, remains ABOVE vocal cords and not ejected out Pharyngeal- Nectar Straw -- Pharyngeal -- Pharyngeal- Thin Teaspoon -- Pharyngeal -- Pharyngeal- Thin Cup -- Pharyngeal -- Pharyngeal- Thin Straw -- Pharyngeal -- Pharyngeal- Puree Delayed swallow initiation-vallecula Pharyngeal -- Pharyngeal- Mechanical Soft Delayed swallow initiation-vallecula Pharyngeal -- Pharyngeal- Regular -- Pharyngeal -- Pharyngeal- Multi-consistency -- Pharyngeal -- Pharyngeal- Pill -- Pharyngeal -- Pharyngeal Comment -- No flowsheet data found. No flowsheet data found. 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 Does the need for close, 24 hr/day medical supervision in concert with the patient's rehab needs make it unreasonable for this patient to be served in a less intensive setting? Yes 2. Co-Morbidities requiring supervision/potential complications: Hypertension, at risk for aspiration pneumonia 3. Due to bladder management, bowel management, safety, skin/wound care, disease  management, medication administration, pain management and patient education, does the patient require 24 hr/day rehab nursing? Yes 4. Does the patient require coordinated care of a physician, rehab nurse, PT (1-2 hrs/day, 5 days/week), OT (1-2 hrs/day, 5 days/week) and SLP (.5-1 hrs/day, 5 days/week) to address physical and functional deficits in the context of the above medical diagnosis(es)? Yes Addressing deficits in the following areas: balance, endurance, locomotion, strength, transferring, bowel/bladder control, bathing, dressing, feeding, grooming, toileting, cognition, speech, language, swallowing and psychosocial support 5. Can the patient actively participate in an intensive therapy program of at least 3 hrs of therapy per day at least 5 days per week? No 6. The potential for patient to make measurable gains while on inpatient rehab is currently he is poor but should improve as cerebral edema resolves 7. Anticipated functional outcomes upon discharge from inpatient rehab are min assist with PT, min assist with OT, min assist with SLP. 8. Estimated rehab length of stay to reach the above functional goals is: 3-4 weeks 9. Does the patient have adequate social supports and living environment to accommodate these discharge functional goals? Potentially 10. Anticipated D/C setting: Home 11. Anticipated post D/C treatments: [place] therapy 12. Overall Rehab/Functional Prognosis: currently poor but expect fair to good improvements with time  RECOMMENDATIONS: This patient's condition is appropriate for continued rehabilitative care in the following setting: CIR once patient is able to participate with PT and OT Patient has agreed to participate in recommended program. N/A Note that insurance prior authorization may be required for reimbursement for recommended care.  Comment: Currently unable to anticipate with PT and OT    08/06/2015       Revision History     Date/Time User Provider Type  Action   08/06/2015 10:20 AM Charlett Blake, MD Physician Sign   08/06/2015 7:42 AM Lavon Paganini McConnells,  PA-C Physician Assistant [place]   View Details Report       Routing History     Date/Time From To Method   08/06/2015 10:20 AM Charlett Blake, MD Crist Infante, MD Fax

## 2015-08-11 NOTE — Progress Notes (Signed)
Patient ID: [name], female   DOB: 19-Feb-1941, 75 y.o.   MRN: [id number] Patient admitted to [phone] via bed, escorted by nursing staff.  Patient unarousable to voice, required sternal rub and only reacted by raising eyebrows.  Patient is unable to follow commands.  Unable to explain rehab process to patient or family at this time.  Notified Marlowe Shores, PA of neurological findings, no new orders written at this time.  VSS.  Patient resting with eyes closed, turned on to left side in bed.   Brita Romp, RN

## 2015-08-11 NOTE — Care Management Note (Signed)
Case Management Note  Patient Details  Name: [name] MRN: [id number] Date of Birth: April 16, 1941  Subjective/Objective:                    Action/Plan: Pt discharging to CIR today. No further needs per CM.   Expected Discharge Date:                  Expected Discharge Plan:  [place]  In-House Referral:     Discharge planning Services     Post Acute Care Choice:    Choice offered to:  Patient, Spouse  DME Arranged:    DME Agency:     HH Arranged:    [place] Agency:     Status of Service:  In process, will continue to follow  Medicare Important Message Given:  Yes Date Medicare IM Given:    Medicare IM give by:    Date Additional Medicare IM Given:    Additional Medicare Important Message give by:     If discussed at [place] of Stay Meetings, dates discussed:    Additional Comments:  Pollie Friar, RN 08/11/2015, 2:15 PM

## 2015-08-11 NOTE — Progress Notes (Signed)
Discharge orders received. Pt notified. Report given to [place], [place] RN. Pt and belongings transferred to [id number]. Writer tried calling pt's husband to notify him of pt's new room number; Probation officer left voicemail asking pt's husband to call back.

## 2015-08-11 NOTE — Discharge Summary (Signed)
Stroke Discharge Summary  Patient ID: shateara tippie   MRN: [id number]      DOB: 1941-01-05  Date of Admission: 08/01/2015 Date of Discharge: 08/11/2015  Attending Physician:  No att. 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 Bronchitis, not specified as acute or chronic   . Other chronic sinusitis   . Other chronic allergic conjunctivitis   . Allergic rhinitis, cause unspecified   . 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 Acute renal insufficiency     due to amphotericin 2012  . Irritable bowel   . Hypertension   . Diverticulosis    Past Surgical History  Procedure Laterality Date  . Cesarean section      x 4  . Vesicovaginal fistula closure w/ tah    . Breast lumpectomy      Right-benign  . Tonsillectomy    . Appendectomy    . Bronchoscopy      Video bronch w/ TBBX 2012    Medications to be continued on Rehab   LABORATORY STUDIES CBC    Component Value Date/Time   WBC 13.9* 08/11/2015 0354   RBC 4.01 08/11/2015 0354   HGB 12.1 08/11/2015 0354   HCT 35.3* 08/11/2015 0354   PLT 298 08/11/2015 0354   MCV 88.0 08/11/2015 0354   MCH 30.2 08/11/2015 0354   MCHC 34.3 08/11/2015 0354   RDW 13.7 08/11/2015 0354   LYMPHSABS 1.2 08/09/2015 0229   MONOABS 1.4* 08/09/2015 0229   EOSABS 0.7 08/09/2015 0229   BASOSABS 0.0 08/09/2015 0229   CMP    Component Value Date/Time   NA 137 08/11/2015 0354   K 3.6 08/11/2015 0354   CL 105 08/11/2015 0354   CO2 16* 08/11/2015 0354    GLUCOSE 80 08/11/2015 0354   BUN 12 08/11/2015 0354   CREATININE 0.72 08/11/2015 0354   CALCIUM 8.8* 08/11/2015 0354   PROT 5.7* 08/03/2015 0311   ALBUMIN 3.4* 08/03/2015 0311   AST 83* 08/03/2015 0311   ALT 28 08/03/2015 0311   ALKPHOS 49 08/03/2015 0311   BILITOT 1.6* 08/03/2015 0311   GFRNONAA >60 08/11/2015 0354   GFRAA >60 08/11/2015 0354   COAGS Lab Results  Component Value Date   INR 1.08 08/01/2015   INR 1.05 07/06/2010   Urinalysis    Component Value Date/Time   COLORURINE YELLOW 08/11/2015 1315   APPEARANCEUR CLEAR 08/11/2015 1315   LABSPEC 1.025 08/11/2015 1315   PHURINE 6.0 08/11/2015 1315   GLUCOSEU NEGATIVE 08/11/2015 1315   GLUCOSEU NEGATIVE 07/03/2010 1429   HGBUR NEGATIVE 08/11/2015 1315   BILIRUBINUR MODERATE* 08/11/2015 1315   KETONESUR >80* 08/11/2015 1315   PROTEINUR NEGATIVE 08/11/2015 1315   UROBILINOGEN 0.2 01/09/2013 1231   NITRITE NEGATIVE 08/11/2015 1315   LEUKOCYTESUR NEGATIVE 08/11/2015 1315   Alcohol Level    Component Value Date/Time   ETH <5 08/01/2015 2005    SIGNIFICANT DIAGNOSTIC STUDIES  Ct Head Wo Contrast  08/01/2015  1. Left basal ganglia hemorrhagic infarct.  2. Mild chronic small vessel white matter ischemic changes in both cerebral hemispheres.  3. Chronic bilateral frontal, bilateral ethmoid and left maxillary sinusitis.  4. Acute right maxillary sinusitis.   08/03/2015 1. Increasing size of left basal ganglia hemorrhage since last CT (but same as on MRI), now measuring 5.1 x 3.5 x 4.5 cm. 2. Increasing surrounding vasogenic edema and mass effect with progressive effacement of the left lateral ventricle and midline shift now measuring 3.5 mm. 3. Diffuse sinus disease.  08/08/2015 1. Left hemisphere hyperdense intra-axial hemorrhage is stable to slightly increased since 08/06/2015. Estimated hemorrhage volume 35 mL. 2. Surrounding edema and regional mass effect are stable, including mild rightward midline shift of 4  mm. 3. No new intracranial abnormality.  MRI with and without contrast  08/02/2015 1. Increased size of acute intraparenchymal hemorrhage centered at the left basal ganglia, now measuring 5.6 x 3.3 x 3.6 cm (estimated volume 33 mL). Associated localized vasogenic edema and mass effect with up to 6 mm of left-to-right shift. Intraventricular extension with small volume blood layering in the occipital horns of both lateral ventricles. No hydrocephalus or evidence of ventricular trapping. 2. No other acute intracranial abnormality. 3. Mild chronic small vessel ischemic disease.  MRA head without contrast 08/03/2015 1. No vascular abnormality or aneurysm underlying the left basal ganglia hemorrhage identified. 2. No large or proximal arterial branch occlusion. Short-segment stenoses at the proximal left M1 and mid right M1 segments. 3. 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  2D echocardiogram - Left ventricle: The cavity size was normal. Wall thickness was increased in a pattern of mild LVH. Systolic function was normal. The estimated ejection fraction was in the range of 60% to 65%. Wall motion was normal; there were no regional wall motion abnormalities. Doppler parameters are consistent with abnormal left ventricular relaxation (grade 1 diastolic dysfunction). - Aortic valve: There was mild stenosis. Valve area (VTI): 1.1 cm^2. Valve area (Vmax): 1.15 cm^2. Valve area (Vmean): 1.15 cm^2. - Mitral valve: Mildly calcified annulus. There was mild regurgitation.  EEG  08/06/15 focal left sided slowing but no seizures 08/09/15 Abnormal awake and drowsy routine inpatient EEG suggestive of underlying neuronal dysfunction in the left frontotemporal region, with epileptogenic potential as described. No evidence of electrographic seizures were seen during  this recording.  Chest Port 1 View 08/01/2015  1. No acute findings.  2. Accentuation of the mediastinal contours may be due to AP technique and low lung volumes.   08/09/2015 No active disease.     HISTORY OF PRESENT ILLNESS EUNISE WIGGIN is an 75 y.o. female with a history of hypertension, brought to the emergency room as a Code Stroke following acute onset of right-sided weakness and speech difficulty. She was last known well at 5:20 PM today 08/01/2015. She has no previous history of stroke or TIA. CT scan of her head with a large left basal ganglia hemorrhage with no extension into left lateral ventricle. Blood pressure was 168/89. Patient was unable to speak and was neglecting her right side, with a gaze to the left. She had no voluntary movement of right extremities. NIH stroke score was 25. IV TPA was not considered given intracerebral hemorrhage. She was admitted to the neuro ICU for further evaluation and treatment.   HOSPITAL COURSE Ms. CHANE GERRITS is a 75 y.o. female with history hypertension and hyperlipidemia presenting with right  hemiparesis and speech difficulties. CT showed a large left basal ganglia hemorrhage.  Stroke: Dominant L basal ganglia hemorrhage with cytotoxic cerebral edema and midline shift, hemorrhage probably secondary to hypertension.  Resultant Drowsy with dense right hemiplegia, aphasia and dysphagia  MRI acute left BG ICH  MRA No vascular abnormality or aneurysm underlying the left basal ganglia hemorrhage identified.  Repeat CT the day following admission shows no significant change in hemorrhage size, 4.5 mm shift, edema, mass effect, no hydrocephalus   2D Echo - EF 60-65%, mild AS - performed January 2017  On no antithrombotic prior to admission  Ongoing aggressive stroke risk factor management  Therapy recommendations: CIR  Disposition: CIR  AMS - improving  EEG - 08/06/15 focal left sided slowing but no seizures  EEG -  08/09/15 Abnormal awake and drowsy routine inpatient EEG suggestive of underlying neuronal dysfunction in the left frontotemporal region, with epileptogenic potential as described. No evidence of electrographic seizures were seen during this recording. Clinical correlation is recommended.   Due to abnormal EEG, will start keppra 500mg  bid.  UA pending - not yet collected. RN to do prior to discharge with I&O cath.  WBC 9.8-> 13.0->11.9->13.9  Afebrile   CXR no infection or pneumonia  Encourage increase daily activity, correct sleep wake cycle disturbance  Hypertensive emergency  BP as high as 193/90 & 120/106 in setting of neurologic symptoms  120-150s this am  Resumed Cozaar 100 mg daily  Hyperlipidemia  Home meds: Lipitor 20 mg daily   Continue statin at discharge  Other Stroke Risk Factors  Advanced age  ETOH use  Other Active Problems  Incontinence of urine - UA pending  Hypokalemia, resolved (replaced)  Poor PO intake - patient refusing food/meds. Discussed temporary feeding tube - concerned she will pull out. After discussion with husband, was placed on D5 normal saline at 50 mL an hour. Nutrition consult recommended.   DISCHARGE EXAM Blood pressure 136/65, pulse 66, temperature 98.4 F (36.9 C), temperature source Oral, resp. rate 16, height 5\' 4"  (1.626 m), weight 64.5 kg (142 lb 3.2 oz), SpO2 99 %. Mental Status: Lethargic, sleepy but arousable. Able to maintain eyes open for a short period of time. Non verbal and she follows no commands. Cranial Nerves: Does not blink to threat on the right but does so on the left. III/IV/VI-Pupils were equal and reacted normally to light. Extraocular movements were full and conjugate but slight left gaze preference.  VII-right lower facial weakness facial weakness. VIII-normal. X-no speech output. Motor: Right hemiplegia with 0/5 strength RUE and 2/5 RLE on pain stimulation. Purposeful movements on the left side.  No posturing. Sensory: Withdraws right extremities to painful stimulation. Deep Tendon Reflexes: 2+, asymmetric with increased responses elicited from right extremities. Plantars: Extensor on the right and flexor on the left Gait - not tested   Discharge Diet    liquids  DISCHARGE PLAN  Disposition:  Transfer to [place] for ongoing PT, OT and ST  Due to hemorrhage and risk of bleeding, do not take aspirin, aspirin-containing medications, or ibuprofen products   Recommend ongoing risk factor control by Primary Care Physician at time of discharge from inpatient rehabilitation.  Follow-up PERINI,MARK A, MD in 2 weeks following discharge from rehab.  Follow-up with Dr. Leonie Man, [facility] in 2 months, office to schedule an appointment.   40 minutes were spent preparing discharge.  [place] for Pager information 08/11/2015 3:50 PM   I,  the attending vascular neurologist, have personally obtained a history, examined the patient, evaluated laboratory data, individually viewed imaging studies and agree with radiology interpretations. I also discussed with sons and Dr. London Pepper regarding her care plan. Together with the NP/PA, we formulated the assessment and plan of care which reflects our mutual decision.  I have made any additions or clarifications directly to the above note and agree with the findings and plan as currently documented.   She also need dietitian consult while she is in CIR, and will need to close watch post stroke depression. Short term tube feeding may be considered if nutrition status not adequate.   Rosalin Hawking, MD PhD Stroke Neurology 08/11/2015 6:01 PM

## 2015-08-11 NOTE — H&P (View-Only) (Signed)
  Physical Medicine and Rehabilitation Admission H&P    Chief Complaint  Patient presents with  . Code Stroke  : HPI: [name] is a 75 y.o. right handed female with history of hypertension. Presented 08/01/2015 with acute onset of aphasia and right-sided weakness. Patient lives with spouse independent prior to admission. Husband is a retired local physician. Blood pressure 120/106. CT of the head and imaging revealed large left basal ganglia hemorrhage with no extension into the left lateral ventricle. MRI of the brain showing increased size of acute intraparenchymal hemorrhage centered at the left basal ganglion measuring 5.6 x 3.3 x 3.6 cm. Associated localized vasogenic edema and mass effect. No hydrocephalus. MRA of the head with no vascular abnormality or aneurysm underlying the left basal ganglia hemorrhage. No larger proximal arterial branch occlusion. Neurology follow-up conservative care and close monitoring of blood pressure. Follow-up CT of the head 08/08/2015 due to increased somnolence that showed left hemisphere hyperdense intra-axial hemorrhage stable to slightly increased from 08/06/2015. Surrounding edema and regional mass effect stable, including mild rightward midline shift of 4 mm. No new intracranial abnormality. EEG 08/09/2015 showed  Mild focal slowing in the left temporal region with rare intermittent left frontotemporal abnormal epileptiform discharges in the form of sharps with phase reversals noted. She was placed on Keppra 500 mg twice a day 08/09/2015. Currently on a dysphagia #2 honey thick liquid diet after modified barium swallow. Subcutaneous heparin for DVT prophylaxis added 08/09/2015.  Bouts of agitation with Seroquel added but later discontinued secondary to somnolence. Mild leukocytosis 13,900 with urinalysis pending. Physical occupational therapy evaluations completed with recommendations of physical medicine rehabilitation consult. Patient was admitted for  a comprehensive rehabilitation program  ROS Review of Systems  Unable to perform ROS:  mental condition   Past Medical History  Diagnosis Date  . Bronchitis, not specified as acute or chronic   . Other chronic sinusitis   . Other chronic allergic conjunctivitis   . Allergic rhinitis, cause unspecified   . Eosinophilic pneumonia (HCC)     Hosp 3/16-30/12  . Acute renal insufficiency     due to amphotericin 2012  . Irritable bowel   . Hypertension   . Diverticulosis    Past Surgical History  Procedure Laterality Date  . Cesarean section      x 4  . Vesicovaginal fistula closure w/ tah    . Breast lumpectomy      Right-benign  . Tonsillectomy    . Appendectomy    . Bronchoscopy      Video bronch w/ TBBX 2012   Family History  Problem Relation Age of Onset  . Aortic aneurysm Father   . Heart failure Mother    Social History:  reports that she has never smoked. She has never used smokeless tobacco. She reports that she drinks alcohol. Her drug history is not on file. Allergies:  Allergies  Allergen Reactions  . Penicillins Hives and Rash    Has patient had a PCN reaction causing immediate rash, facial/tongue/throat swelling, SOB or lightheadedness with hypotension: Yes Has patient had a PCN reaction causing severe rash involving mucus membranes or skin necrosis: No Has patient had a PCN reaction that required hospitalization unknown Has patient had a PCN reaction occurring within the last 10 years: No If all of the above answers are "NO", then may proceed with Cephalosporin use.   Medications Prior to Admission  Medication Sig Dispense Refill  . atorvastatin (LIPITOR) 20 MG tablet Take 20 mg   by mouth at bedtime.   3  . bimatoprost (LUMIGAN) 0.01 % SOLN Place 1 drop into both eyes at bedtime.    . cetirizine (ZYRTEC) 10 MG tablet Take 10 mg by mouth daily as needed (seasonal allergies).    . estrogens, conjugated, (PREMARIN) 0.3 MG tablet Take 0.3 mg by mouth every  other day.     . fluticasone (FLONASE) 50 MCG/ACT nasal spray Place 1 spray into both nostrils daily as needed (seasonal allergies).    . losartan (COZAAR) 100 MG tablet Take 100 mg by mouth at bedtime.   3  . Olopatadine HCl (PATADAY) 0.2 % SOLN Place 1 drop into both eyes every 12 (twelve) hours as needed (seasonal allergies).    . Polyethyl Glycol-Propyl Glycol (SYSTANE OP) Place 1 drop into both eyes daily as needed (dry eyes).    . Probiotic Product (ALIGN) 4 MG CAPS Take 4 mg by mouth 2 (two) times daily.    . HYDROcodone-acetaminophen (NORCO) 5-325 MG per tablet Take 1 tablet by mouth every 4 (four) hours as needed for pain. (Patient not taking: Reported on 08/02/2015) 20 tablet 0  . ondansetron (ZOFRAN) 8 MG tablet Take 1 tablet (8 mg total) by mouth every 8 (eight) hours as needed for nausea. (Patient not taking: Reported on 08/02/2015) 16 tablet 0    Home: Home Living Family/patient expects to be discharged to:: Private residence Living Arrangements: Spouse/significant other Available Help at Discharge: Family, Available 24 hours/day Type of Home: House Home Access: Stairs to enter Entrance Stairs-Number of Steps: 1 + 1 Home Layout: Two level, 1/2 bath on main level Alternate Level Stairs-Number of Steps: full flight  Bathroom Toilet: Standard Home Equipment: None (?) Additional Comments: .  Family can hire assist if needed.  house has the capability of having an elevator installed - sons are contractors and can make the renovations   Lives With: Spouse   Functional History: Prior Function Level of Independence: Independent  Functional Status:  Mobility: Bed Mobility Overal bed mobility: Needs Assistance Bed Mobility: Supine to Sit Supine to sit: Mod assist, HOB elevated (sit>supine requires max assist!) General bed mobility comments: able to follow commands with tactile cues to roll to right with 1 hand assist and push up on rail to sitting; less control into supine  requiring therapist to lift legs and pivot body for patient to return to bed surface Transfers Overall transfer level: Needs assistance Equipment used: 1 person hand held assist Transfers: Sit to/from Stand Sit to Stand: Max assist Stand pivot transfers: Mod assist Squat pivot transfers: Max assist, From elevated surface General transfer comment: total assist to support right side and prevent collapse to right, does attempt to stand tall vs. leaning dependently on therapist; left side assists Ambulation/Gait Ambulation/Gait assistance:  (unable to effectively wt shift to unload feet for pregait) General Gait Details: unable without +2 assist    ADL: ADL Overall ADL's : Needs assistance/impaired Eating/Feeding: Minimal assistance, Bed level Grooming: Wash/dry hands, Wash/dry face, Minimal assistance, Sitting Upper Body Bathing: Minimal assitance, Sitting Lower Body Bathing: Moderate assistance, Sit to/from stand Upper Body Dressing : Maximal assistance, Sitting Lower Body Dressing: Sit to/from stand, Maximal assistance Lower Body Dressing Details (indicate cue type and reason): Pt able to don socks with min A and doff with min guard assist  Toilet Transfer: Moderate assistance, Squat-pivot, BSC Toileting- Clothing Manipulation and Hygiene: Maximal assistance, Sit to/from stand Functional mobility during ADLs: Moderate assistance General ADL Comments: Pt appears very motivated     Cognition: Cognition Overall Cognitive Status: Difficult to assess Arousal/Alertness: Lethargic Orientation Level: Other (comment) Attention: Focused, Sustained Focused Attention: Appears intact (intermittently in tact) Sustained Attention: Impaired Sustained Attention Impairment: Verbal basic, Functional basic (able to sustain attention to basic tasks for 10-20 seconds) Problem Solving:  (demosntrates some basic functional problem solving) Cognition Arousal/Alertness: Lethargic (awakens to voice,  sustains with activity) Behavior During Therapy: Flat affect (winks or sustains eye contact, does try to speak once) Overall Cognitive Status: Difficult to assess Area of Impairment: Attention, Following commands, Awareness Current Attention Level: Focused Following Commands: Follows one step commands inconsistently Safety/Judgement: Decreased awareness of safety, Decreased awareness of deficits Awareness: Intellectual Problem Solving: Decreased initiation, Requires verbal cues, Requires tactile cues General Comments: less lethargic than notes from previous day reflect, sleeping soundly on arrival, able to around with voice, and sustains arousal with mobility/activity Difficult to assess due to: Impaired communication  Physical Exam: Blood pressure 148/69, pulse 66, temperature 98.2 F (36.8 C), temperature source Axillary, resp. rate 20, height 5' 4" (1.626 m), weight 64.5 kg (142 lb 3.2 oz), SpO2 99 %. Physical Exam Constitutional: She appears well-developed, well nourished. NAD. HENT:  Head: Normocephalic. Atraumatic.  Eyes:  Pupils reactive to light  Neck: Normal range of motion. Neck supple. No thyromegaly present.  Cardiovascular: Normal rate and regular rhythm. + Murmur Respiratory: Effort normal and breath sounds normal. No respiratory distress.  GI: Soft. Bowel sounds are normal. She exhibits no distension.  Neurological:  Lethargic, unable to arouse.  DTRs 3+ right upper and right lower extremity Unable to assess sensation. Motor:  Right upper extremity and right lower extremity appear to be 0/5 Left upper and left lower extremity greater than or equal to 3/5 (patient not participating in MMT) + Babinski bilaterally. + Clonus right lower extremity - Hoffman's Increased flexor tone in the right upper extremity Skin: Skin is warm and dry.   Results for orders placed or performed during the hospital encounter of 08/01/15 (from the past 48 hour(s))  CBC     Status:  Abnormal   Collection Time: 08/10/15  5:50 AM  Result Value Ref Range   WBC 11.9 (H) 4.0 - 10.5 K/uL   RBC 3.99 3.87 - 5.11 MIL/uL   Hemoglobin 12.0 12.0 - 15.0 g/dL   HCT 34.9 (L) 36.0 - 46.0 %   MCV 87.5 78.0 - 100.0 fL   MCH 30.1 26.0 - 34.0 pg   MCHC 34.4 30.0 - 36.0 g/dL   RDW 13.5 11.5 - 15.5 %   Platelets 282 150 - 400 K/uL  Basic metabolic panel     Status: Abnormal   Collection Time: 08/10/15  5:50 AM  Result Value Ref Range   Sodium 136 135 - 145 mmol/L   Potassium 3.0 (L) 3.5 - 5.1 mmol/L   Chloride 105 101 - 111 mmol/L   CO2 16 (L) 22 - 32 mmol/L   Glucose, Bld 81 65 - 99 mg/dL   BUN 10 6 - 20 mg/dL   Creatinine, Ser 0.66 0.44 - 1.00 mg/dL   Calcium 8.7 (L) 8.9 - 10.3 mg/dL   GFR calc non Af Amer >60 >60 mL/min   GFR calc Af Amer >60 >60 mL/min    Comment: (NOTE) The eGFR has been calculated using the CKD EPI equation. This calculation has not been validated in all clinical situations. eGFR's persistently <60 mL/min signify possible Chronic Kidney Disease.    Anion gap 15 5 - 15  CBC     Status:  Abnormal   Collection Time: 08/11/15  3:54 AM  Result Value Ref Range   WBC 13.9 (H) 4.0 - 10.5 K/uL   RBC 4.01 3.87 - 5.11 MIL/uL   Hemoglobin 12.1 12.0 - 15.0 g/dL   HCT 35.3 (L) 36.0 - 46.0 %   MCV 88.0 78.0 - 100.0 fL   MCH 30.2 26.0 - 34.0 pg   MCHC 34.3 30.0 - 36.0 g/dL   RDW 13.7 11.5 - 15.5 %   Platelets 298 150 - 400 K/uL  Basic metabolic panel     Status: Abnormal   Collection Time: 08/11/15  3:54 AM  Result Value Ref Range   Sodium 137 135 - 145 mmol/L   Potassium 3.6 3.5 - 5.1 mmol/L   Chloride 105 101 - 111 mmol/L   CO2 16 (L) 22 - 32 mmol/L   Glucose, Bld 80 65 - 99 mg/dL   BUN 12 6 - 20 mg/dL   Creatinine, Ser 0.72 0.44 - 1.00 mg/dL   Calcium 8.8 (L) 8.9 - 10.3 mg/dL   GFR calc non Af Amer >60 >60 mL/min   GFR calc Af Amer >60 >60 mL/min    Comment: (NOTE) The eGFR has been calculated using the CKD EPI equation. This calculation has not  been validated in all clinical situations. eGFR's persistently <60 mL/min signify possible Chronic Kidney Disease.    Anion gap 16 (H) 5 - 15  Magnesium     Status: None   Collection Time: 08/11/15  3:54 AM  Result Value Ref Range   Magnesium 1.8 1.7 - 2.4 mg/dL   Dg Chest Port 1 View  08/09/2015  CLINICAL DATA:  Leukocytosis. EXAM: PORTABLE CHEST 1 VIEW COMPARISON:  08/06/2015 FINDINGS: The heart size and mediastinal contours are within normal limits. Both lungs are clear. No pleural effusion or pneumothorax. The visualized skeletal structures are unremarkable. IMPRESSION: No active disease. Electronically Signed   By: David  Ormond M.D.   On: 08/09/2015 09:40       Medical Problem List and Plan: 1.  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  DVT Prophylaxis/Anticoagulation: Subcutaneous heparin initiated 08/09/2015 3. Pain Management: Hydrocodone as needed 4. Dysphagia. Dysphagia #2 honey thick liquids. IV fluids for hydration. Follow-up speech therapy 5. Neuropsych: This patient is not capable of making decisions on her own behalf. 6. Skin/Wound Care: Routine skin checks 7. Decreased nutritional storage. Dietary follow-up. Check calorie counts 8. Fluids/Electrolytes/Nutrition: Routine I&O's with follow-up chemistries 9. Seizure prophylaxis. Keppra 500 mg twice a day 10. Leukocytosis. Follow-up urine study/follow-up CBC 11. Hypertension. Cozaar 100 mg daily. Monitor with increased mobility 12. Hyperlipidemia. Lipitor 13. Lethargy post stroke: We'll consider neuro stimulant  Post Admission Physician Evaluation: 1. Functional deficits secondary  to left basal ganglia hypertensive intracranial hemorrhage. 2. Patient is admitted to receive collaborative, interdisciplinary care between the physiatrist, rehab nursing staff, and therapy team. 3. Patient's level of medical complexity and substantial therapy needs in  context of that medical necessity cannot be provided at a lesser intensity of care such as a SNF. 4. Patient has experienced substantial functional loss from his/her baseline which was documented above under the "Functional History" and "Functional Status" headings.  Judging by the patient's diagnosis, physical exam, and functional history, the patient has potential for functional progress which will result in measurable gains while on inpatient rehab.  These gains will be of substantial and practical use upon discharge  in facilitating mobility and self-care at the household level.   5. Physiatrist will provide 24 hour management of medical needs as well as oversight of the therapy plan/treatment and provide guidance as appropriate regarding the interaction of the two. 6. 24 hour rehab nursing will assist with bladder management, bowel management, safety, skin/wound care, disease management, medication administration, pain management and patient education and help integrate therapy concepts, techniques,education, etc. 7. PT will assess and treat for/with: Lower extremity strength, range of motion, stamina, balance, functional mobility, safety, adaptive techniques and equipment, woundcare, coping skills, pain control, education.   Goals are: Mod/min A. 8. OT will assess and treat for/with: ADL's, functional mobility, safety, upper extremity strength, adaptive techniques and equipment, wound mgt, ego support, and community reintegration.   Goals are: Mod/min A. Therapy may proceed with showering this patient. 9. SLP will assess and treat for/with: Speech, language, swallowing, cognition.  Goals are: Mod A. 10. Case Management and Social Worker will assess and treat for psychological issues and discharge planning. 11. Team conference will be held weekly to assess progress toward goals and to determine barriers to discharge. 12. Patient will receive at least 3 hours of therapy per day at least 5 days per  week. 13. ELOS: 24-30 days       14. Prognosis:  fair  Delice Lesch, MD 08/11/2015

## 2015-08-12 ENCOUNTER — Inpatient Hospital Stay (HOSPITAL_COMMUNITY): Payer: Medicare Other | Admitting: Physical Therapy

## 2015-08-12 ENCOUNTER — Inpatient Hospital Stay (HOSPITAL_COMMUNITY): Payer: Medicare Other | Admitting: Speech Pathology

## 2015-08-12 ENCOUNTER — Inpatient Hospital Stay (HOSPITAL_COMMUNITY): Payer: Medicare Other | Admitting: Occupational Therapy

## 2015-08-12 ENCOUNTER — Inpatient Hospital Stay (HOSPITAL_COMMUNITY): Payer: Medicare Other

## 2015-08-12 LAB — COMPREHENSIVE METABOLIC PANEL
ALBUMIN: 2.7 g/dL — AB (ref 3.5–5.0)
ALT: 14 U/L (ref 14–54)
AST: 16 U/L (ref 15–41)
Alkaline Phosphatase: 61 U/L (ref 38–126)
Anion gap: 9 (ref 5–15)
BUN: 11 mg/dL (ref 6–20)
CHLORIDE: 109 mmol/L (ref 101–111)
CO2: 20 mmol/L — AB (ref 22–32)
CREATININE: 0.62 mg/dL (ref 0.44–1.00)
Calcium: 8.6 mg/dL — ABNORMAL LOW (ref 8.9–10.3)
GFR calc Af Amer: >60 mL/min (ref >60)
GLUCOSE: 130 mg/dL — AB (ref 65–99)
Potassium: 3.1 mmol/L — ABNORMAL LOW (ref 3.5–5.1)
SODIUM: 138 mmol/L (ref 135–145)
Total Bilirubin: 1.5 mg/dL — ABNORMAL HIGH (ref 0.3–1.2)
Total Protein: 5.7 g/dL — ABNORMAL LOW (ref 6.5–8.1)

## 2015-08-12 LAB — CBC WITH DIFFERENTIAL/PLATELET
Basophils Absolute: 0 10*3/uL (ref 0.0–0.1)
Basophils Relative: 0 %
Eosinophils Absolute: 0.3 10*3/uL (ref 0.0–0.7)
Eosinophils Relative: 3 %
HEMATOCRIT: 33.5 % — AB (ref 36.0–46.0)
HEMOGLOBIN: 11.5 g/dL — AB (ref 12.0–15.0)
LYMPHS ABS: 1.3 10*3/uL (ref 0.7–4.0)
Lymphocytes Relative: 11 %
MCH: 29.8 pg (ref 26.0–34.0)
MCHC: 34.3 g/dL (ref 30.0–36.0)
MCV: 86.8 fL (ref 78.0–100.0)
MONO ABS: 1.8 10*3/uL — AB (ref 0.1–1.0)
MONOS PCT: 15 %
NEUTROS ABS: 8.5 10*3/uL — AB (ref 1.7–7.7)
NEUTROS PCT: 71 %
PLATELETS: 347 10*3/uL (ref 150–400)
RBC: 3.86 MIL/uL — ABNORMAL LOW (ref 3.87–5.11)
RDW: 13.8 % (ref 11.5–15.5)
WBC: 11.9 10*3/uL — ABNORMAL HIGH (ref 4.0–10.5)

## 2015-08-12 LAB — GLUCOSE, CAPILLARY: Glucose-Capillary: 135 mg/dL — ABNORMAL HIGH (ref 65–99)

## 2015-08-12 MED ORDER — JEVITY 1.2 CAL PO LIQD
1000.0000 mL | ORAL | Status: DC
  Administered 2015-08-12: 1000 mL
  Filled 2015-08-12: qty 1000

## 2015-08-12 MED ORDER — JEVITY 1.2 CAL PO LIQD
1000.0000 mL | ORAL | Status: DC
  Filled 2015-08-12 (×5): qty 1000

## 2015-08-12 MED ORDER — JEVITY 1.2 CAL PO LIQD: 1000.0000 mL | ORAL | Status: DC

## 2015-08-12 MED ORDER — PRO-STAT SUGAR FREE PO LIQD
30.0000 mL | Freq: Every day | ORAL | Status: DC
  Administered 2015-08-12 – 2015-08-14 (×3): 30 mL
  Filled 2015-08-12 (×3): qty 30

## 2015-08-12 NOTE — Progress Notes (Addendum)
[facility]ILIT[facility]     PROGRESS NOTE    Subjective/Complaints: Fairly uneventful night. Still somewhat lethargic. Appears comfortable. Has low grade temp.  ROS limited due to cognitive status  Objective: Vital Signs: Blood pressure 159/69, pulse 82, temperature 99.1 F (37.3 C), temperature source Oral, resp. rate 16, SpO2 94 %. No results found.  Recent Labs  08/11/15 0354 08/12/15 0742  WBC 13.9* 11.9*  HGB 12.1 11.5*  HCT 35.3* 33.5*  PLT 298 347    Recent Labs  08/10/15 0550 08/11/15 0354  NA 136 137  K 3.0* 3.6  CL 105 105  GLUCOSE 81 80  BUN 10 12  CREATININE 0.66 0.72  CALCIUM 8.7* 8.8*   CBG (last 3)  No results for input(s): GLUCAP in the last 72 hours.  Wt Readings from Last 3 Encounters:  08/05/15 64.5 kg (142 lb 3.2 oz)  06/14/11 64.501 kg (142 lb 3.2 oz)  02/03/11 61.598 kg (135 lb 12.8 oz)    Physical Exam:  Constitutional: She appears well-developed, well nourished. NAD. HENT:  Head: Normocephalic. Atraumatic.  Eyes:  Pupils reactive to light  Neck: Normal range of motion. Neck supple. No thyromegaly present.  Cardiovascular: Normal rate and regular rhythm. + systolic Murmur Respiratory: Effort normal and breath sounds normal. No respiratory distress. no wheezes or rales GI: Soft. Bowel sounds are normal. She exhibits no distension.  Neurological:  Lethargic, opens her eyes and makes eye contact. Squeezed my hand when asked with her left hand  DTRs 3+ right upper and right lower extremity Unable to assess sensation. Motor:  Right upper extremity and right lower extremity appear to be 0/5 Left upper and left lower extremity potentially 3/5 or more (patient not participating in MMT) + Babinski bilaterally. + Clonus right lower extremity - Hoffman's Increased flexor tone in the right upper extremity Skin: Skin is warm and dry.   Assessment/Plan: 1. Right hemiparesis and cognitive deficits secondary  to left basal ganglia ICH which require 3+ hours per day of interdisciplinary therapy in a comprehensive inpatient rehab setting. Physiatrist is providing close team supervision and 24 hour management of active medical problems listed below. Physiatrist and rehab team continue to assess barriers to discharge/monitor patient progress toward functional and medical goals.  Function:  Bathing Bathing position   Position: Bed  Bathing parts Body parts bathed by patient: Chest, Abdomen Body parts bathed by helper: Right arm, Left arm, Front perineal area, Buttocks, Right upper leg, Left upper leg, Right lower leg, Left lower leg, Back  Bathing assist Assist Level:  (TOTAL a)      Upper Body Dressing/Undressing Upper body dressing   What is the patient wearing?: Hospital gown                Upper body assist Assist Level:  (total A)      Lower Body Dressing/Undressing Lower body dressing   What is the patient wearing?: Hospital Gown, [place], Non-skid slipper socks           Non-skid slipper socks- Performed by helper: Don/doff right sock, Don/doff left sock               TED Hose - Performed by helper: Don/doff right TED hose, Don/doff left TED hose  Lower body assist        Toileting Toileting Toileting activity did not occur: Safety/medical concerns        Toileting assist     Transfers Chair/bed transfer  Locomotion Ambulation           Wheelchair          Cognition Comprehension Comprehension assist level: Other (comment) (patient not verbally responsive, doesn't follow commands)  Expression    Social Interaction Social Interaction assist level:  (non verbal, no reaction to commands)  Problem Solving Problem solving assist level:  (unable to assess patient- non verbal, no reaction to command)  Memory     Medical Problem List and Plan: 1. Right hemiplegia, aphasia, dysphagia secondary to left basal ganglia hypertensive  intracranial hemorrhage  -pursue therapies as possible given arousal 2. DVT Prophylaxis/Anticoagulation: Subcutaneous heparin initiated 08/09/2015 3. Pain Management: Hydrocodone as needed 4. Dysphagia. Dysphagia #2 honey thick liquids. IV fluids for hydration.   -will insert NGT today given need for nutrition 5. Neuropsych: This patient is not capable of making decisions on her own behalf. 6. Skin/Wound Care: Routine skin checks 7. Decreased nutritional storage. Dietary follow-up. Check calorie counts  -NGT 8. Fluids/Electrolytes/Nutrition: following lytes, supplementing PO with NGT until more alert 9. Seizure prophylaxis. Keppra 500 mg twice a day 10. Leukocytosis. Follow-up urine study/follow-up CBC 11. Hypertension. Cozaar 100 mg daily. Monitor with increased mobility 12. Hyperlipidemia. Lipitor 13. Lethargy post stroke:   -will review with husband regarding a stimulant 14. Low grade temp  -ua unremarkable. cx pending  -will check cxr as well today (after NGT placed)   LOS (Days) 1 A FACE TO FACE EVALUATION WAS PERFORMED  Rodrigus Kilker T 08/12/2015 8:50 AM

## 2015-08-12 NOTE — Progress Notes (Signed)
Occupational Therapy Session Note  Patient Details  Name: [name] MRN: [id number] Date of Birth: [birth year]  Today's Date: 08/12/2015 OT Individual Time: [id number] OT Individual Time Calculation (min): 55 min     Skilled Therapeutic Interventions/Progress Updates:    Pt requiring max instructional cueing to arouse for therapy session but once awake, she maintained eyes open majority of session.  Initially, with head turned to the left side and gaze to the left.  Therapist provided max assist to turn head to the right with pt also scanning across midline to locate therapist on the right side.  She was able to maintain head at midline after this but would occasionally grab the back of her neck and grimace.  She followed simple one step commands with the LUE such as "touch your nose", or "point to your ear".  However, she was non-verbal throughout session and could not respond with head movements to "yes" or "no" questions when asked.  She washed her face using the LUE to command but needed max assist for therapist to assist with washing the RUE.  No active movement initiated or noted in the RUE or hand this session.  Worked on rolling side to side for cleaning of bladder accident and donning new brief.  Total assist for sequencing and rolling to the left side with max assist for rolling to the right.  [name] son Merry Proud in at end session.  Educated him on positioning for the RUE and RLE as well as instructing him to position himself on the right side of pt to encourage scanning to the right.  Wrist restraint and mit applied to pt's left hand at end of session as well as bed alarm in place.    Therapy Documentation Precautions:  Precautions Precautions: Fall Precaution Comments: right sided weakness, impulsivity, R neglect Restrictions Weight Bearing Restrictions: No  Pain: Pain Assessment Faces Pain Scale: No hurt ADL: See Function Navigator for Current Functional  Status.   Therapy/Group: Individual Therapy  Race Latour OTR/L 08/12/2015, 4:12 PM

## 2015-08-12 NOTE — Progress Notes (Signed)
Called [facility] tube team, Myriam Jacobson, to place NG feeding tube today.  Brita Romp, RN

## 2015-08-12 NOTE — Evaluation (Signed)
Speech Language Pathology Assessment and Plan  Patient Details  Name: [name] MRN: [id number] Date of Birth: 1941-03-20  SLP Diagnosis: Cognitive Impairments;Aphasia  Rehab Potential: Good ELOS: 3-4 weeks     Today's Date: 08/12/2015 SLP Individual Time: 0900-0930 SLP Individual Time Calculation (min): 30 min  Missed Time: 30 minutes due to fatigue   Problem List:  Patient Active Problem List   Diagnosis Date Noted  . Left Basal ganglia hemorrhage ([place]) 08/11/2015  . Hypertensive emergency 08/11/2015  . Incontinence 08/11/2015  . Hypokalemia 08/11/2015  . Protein-calorie malnutrition ([place]) 08/11/2015  . Hemiplegia affecting dominant side ([place])   . Aphasia, post-stroke   . Dysphagia, post-stroke   . Dysphasia, post-stroke   . Hemiplegia, post-stroke ([place])   . Cognitive deficit, post-stroke   . Seizure prophylaxis   . Leukocytosis   . Benign essential HTN   . HLD (hyperlipidemia)   . Lethargy   . Altered mental status   . Cytotoxic brain edema ([place]) 08/05/2015  . Brain herniation ([place]) 08/05/2015  . ICH (intracerebral hemorrhage) ([place]) 08/01/2015  . Hepatitis 08/12/2010  . Renal failure 08/12/2010  . 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 CONJUNCTIVITIS, ALLERGIC 09/02/2008  . 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 ALLERGIC RHINITIS 09/02/2008  . BRONCHITIS 09/02/2008   Past Medical History:  Past Medical History  Diagnosis Date  . Bronchitis, not specified as acute or chronic   . Other chronic sinusitis   . Other chronic allergic conjunctivitis   . Allergic rhinitis, cause unspecified   . 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 Acute renal insufficiency     due to amphotericin 2012  . Irritable bowel   . Hypertension   . Diverticulosis    Past Surgical History:  Past Surgical History  Procedure Laterality Date  . Cesarean section      x 4  . Vesicovaginal fistula closure w/ tah    . Breast lumpectomy      Right-benign  .  Tonsillectomy    . Appendectomy    . Bronchoscopy      Video bronch w/ TBBX 2012    Assessment / Plan / Recommendation Clinical Impression Patient is a 75 y.o. right handed female with history of hypertension. Presented 08/01/2015 with acute onset of aphasia and right-sided weakness. Patient lives with spouse independent prior to admission. Husband is a retired Field seismologist. Blood pressure 120/106. CT of the head and imaging revealed large left basal ganglia hemorrhage with no extension into the left lateral ventricle. MRI of the brain showing increased size of acute intraparenchymal hemorrhage centered at the left basal ganglion measuring 5.6 x 3.3 x 3.6 cm. Associated localized vasogenic edema and mass effect. No hydrocephalus.  Follow-up CT of the head 08/08/2015 due to increased somnolence that showed left hemisphere hyperdense intra-axial hemorrhage stable to slightly increased from 08/06/2015. Surrounding edema and regional mass effect stable, including mild rightward midline shift of 4 mm. No new intracranial abnormality. EEG 08/09/2015 showed mild focal slowing in the left temporal region with rare intermittent left frontotemporal abnormal epileptiform discharges in the form of sharps with phase reversals noted. She was placed on Keppra 500 mg twice a day 08/09/2015. Currently on a dysphagia 2 textures with honey thick liquids after MBS on 4/17. Physical occupational therapy evaluations completed with recommendations of physical medicine rehabilitation consult. Patient was admitted for a comprehensive rehabilitation program on 08/11/15. Patient was admitted a limited cognitive-linguistic evaluation due to decreased arousal. Patient was able to maintain  arousal for 10-20 seconds despite Max A multimodal cues and multiple attempts. Patient required total A to complete basic self-care tasks of washing her face and brushing her teeth with the suction toothbrush and was nonverbal throughout the session.  A BSE was not administered due to decreased arousal. Patient missed last 30 minutes of session due to lethargy and inability to participate. Patient would benefit from skilled SLP intervention to maximize cognitive-linguistic function and overall functional independence prior to discharge.    Skilled Therapeutic Interventions          Administered a limited cognitive-linguistic. Please see above for details.  SLP Assessment  Patient will need skilled [facility] during CIR admission    Recommendations  Recommendations for Other Services: Neuropsych consult Patient destination: Home Follow up Recommendations: Home Health SLP;24 hour supervision/assistance Equipment Recommended: To be determined    SLP Frequency 3 to 5 out of 7 days   SLP Duration  SLP Intensity  SLP Treatment/Interventions 3-4 weeks   Minumum of 1-2 x/day, 30 to 90 minutes  Cognitive remediation/compensation;Cueing hierarchy;Patient/family education;Therapeutic Activities;Environmental controls;Internal/external aids;Functional tasks;Speech/Language facilitation    Pain No s/s of pain  Function:   Cognition Comprehension Comprehension assist level: Understands basic less than 25% of the time/ requires cueing >75% of the time  Expression   Expression assist level: Expresses basis less than 25% of the time/requires cueing >75% of the time.  Social Interaction Social Interaction assist level: Interacts appropriately less than 25% of the time. May be withdrawn or combative.  Problem Solving Problem solving assist level: Solves basic less than 25% of the time - needs direction nearly all the time or does not effectively solve problems and may need a restraint for safety  Memory Memory assist level: Recognizes or recalls less than 25% of the time/requires cueing greater than 75% of the time   Short Term Goals: Week 1: SLP Short Term Goal 1 (Week 1): Patient will maintain arousal in regards to  keeping her eyes open for ~60 seconds with Max A multimodal cues.  SLP Short Term Goal 2 (Week 1): Patient will follow basic 1 step commands in 50% of opporunities with Max A multimodal cues.  SLP Short Term Goal 3 (Week 1): Patient will answer basic yes/no questions utilizing multimodal communication with 50% accuracy with Max A multimodal cues.   Refer to Care Plan for Long Term Goals  Recommendations for other services: None  Discharge Criteria: Patient will be discharged from SLP if patient refuses treatment 3 consecutive times without medical reason, if treatment goals not met, if there is a change in medical status, if patient makes no progress towards goals or if patient is discharged from hospital.  The above assessment, treatment plan, treatment alternatives and goals were discussed and mutually agreed upon: No family available/patient unable  [place], [place] 08/12/2015, 3:55 PM

## 2015-08-12 NOTE — Progress Notes (Signed)
Initial Nutrition Assessment  DOCUMENTATION CODES:   Severe malnutrition in context of acute illness/injury  INTERVENTION:   48 hour calorie count has been initiated.   Initiate Jevity 1.2 formula at 30 ml/hr via [place] NGT and increase by 5 ml every 4 hours to new goal rate of 65 ml/hr x 20 hours (may hold for up to 4 hours for therapy) with 30 ml Prostat once daily to provide 1660 kcal (100% of needs), 87 grams of protein, and 1053 ml of free water.    Once IV fluids are discontinued, recommend providing free water flushes of 150 ml TID.   Monitor magnesium, potassium, and phosphorus daily for at least 3 days, MD to replete as needed, as pt is at risk for refeeding syndrome given little to no po intake over the past 1 week and severe malnutrition.   RD to continue to monitor.   NUTRITION DIAGNOSIS:   Malnutrition related to acute illness as evidenced by energy intake < or equal to 50% for > or equal to 5 days, moderate depletions of muscle mass.  GOAL:   Patient will meet greater than or equal to 90% of their needs  MONITOR:   PO intake, Diet advancement, TF tolerance, Weight trends, Labs, I & O's  REASON FOR ASSESSMENT:   Consult Assessment of nutrition requirement/status  ASSESSMENT:   75 y.o. right handed female with history of hypertension. 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  C[place] NGT in place. Pt to be supplemented with TF until pt is more alert and able to take more po. Family at bedside. They reports pt with no po intake since acute hospitalization admission. Noted pt had tube feeding running 4/15, however pt pulled out the tube the next day. Diet was advanced, however very poor po intake due to lethargy. Pt is at risk for refeeding syndrome once nutrition is started. Plans to start tube feeding today. RD to modify orders to provide adequate nutritional  needs. Family reports pt usual body weight is ~140 lbs. No new weight recorded, thus pt was weighed on bed scale which revealed 148 lbs. Noted calorie count has been initiated. Per RN, pt with no po intake today. RD to continue to monitor.   Nutrition-Focused physical exam completed. Findings are no fat depletion, mild to moderate muscle depletion, and mild edema.   Labs: Low potassium, CO2, calcium. High total bilirubin.   Diet Order:  DIET DYS 2 Room service appropriate?: Yes; Fluid consistency:: Honey Thick  Skin:  Reviewed, no issues  Last BM:  4/20  Height:   Ht Readings from Last 1 Encounters:  08/01/15 5\' 4"  (1.626 m)    Weight:   Wt Readings from Last 1 Encounters:  08/05/15 142 lb 3.2 oz (64.5 kg)    Ideal Body Weight:  54.5 kg  BMI:  There is no weight on file to calculate BMI.  Estimated Nutritional Needs:   Kcal:  1650-1850  Protein:  75-90 grams  Fluid:  1.6 - 1.8 L/day  EDUCATION NEEDS:   No education needs identified at this time  Corrin Parker, MS, RD, LDN Pager # [phone] After hours/ weekend pager # [phone]

## 2015-08-12 NOTE — Progress Notes (Signed)
Patient information reviewed and entered into eRehab system by Sadako Cegielski, RN, CRRN, PPS Coordinator.  Information including medical coding and functional independence measure will be reviewed and updated through discharge.     Per nursing patient was given "Data Collection Information Summary for Patients in Inpatient Rehabilitation Facilities with attached "Privacy Act Statement-Health Care Records" upon admission.  

## 2015-08-12 NOTE — Progress Notes (Signed)
Physical Therapy Note  Patient Details  Name: [name] MRN: [id number] Date of Birth: 04/07/1941 Today's Date: 08/12/2015  Patient missed 60 min PT evaluation due to being off unit for xray. Will f/u as able per POC.    [name] A 08/12/2015, 1:58 PM

## 2015-08-12 NOTE — Evaluation (Signed)
Occupational Therapy Assessment and Plan  Patient Details  Name: [name] MRN: [id number] Date of Birth: 10-Aug-1940  OT Diagnosis: cognitive deficits, disturbance of vision, hemiplegia affecting dominant side and coordination disorder Rehab Potential: Rehab Potential (ACUTE ONLY): Fair ELOS: 25-28 days   Today's Date: 08/12/2015 OT Individual Time: 0702-0802 OT Individual Time Calculation (min): 60 min     Problem List:  Patient Active Problem List   Diagnosis Date Noted  . Left Basal ganglia hemorrhage ([place]) 08/11/2015  . Hypertensive emergency 08/11/2015  . Incontinence 08/11/2015  . Hypokalemia 08/11/2015  . Protein-calorie malnutrition ([place]) 08/11/2015  . Hemiplegia affecting dominant side ([place])   . Aphasia, post-stroke   . Dysphagia, post-stroke   . Dysphasia, post-stroke   . Hemiplegia, post-stroke ([place])   . Cognitive deficit, post-stroke   . Seizure prophylaxis   . Leukocytosis   . Benign essential HTN   . HLD (hyperlipidemia)   . Lethargy   . Altered mental status   . Cytotoxic brain edema ([place]) 08/05/2015  . Brain herniation ([place]) 08/05/2015  . ICH (intracerebral hemorrhage) ([place]) 08/01/2015  . Hepatitis 08/12/2010  . Renal failure 08/12/2010  . 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 CONJUNCTIVITIS, ALLERGIC 09/02/2008  . 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 ALLERGIC RHINITIS 09/02/2008  . BRONCHITIS 09/02/2008    Past Medical History:  Past Medical History  Diagnosis Date  . Bronchitis, not specified as acute or chronic   . Other chronic sinusitis   . Other chronic allergic conjunctivitis   . Allergic rhinitis, cause unspecified   . 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 Acute renal insufficiency     due to amphotericin 2012  . Irritable bowel   . Hypertension   . Diverticulosis    Past Surgical History:  Past Surgical History  Procedure Laterality Date  . Cesarean section      x 4  . Vesicovaginal fistula closure w/  [place]    . Breast lumpectomy      Right-benign  . Tonsillectomy    . Appendectomy    . Bronchoscopy      Video bronch w/ TBBX 2012    Assessment & Plan Clinical Impression: Patient is a 75 y.o. year old female with history of hypertension. Presented 08/01/2015 with acute onset of aphasia and right-sided weakness. Patient lives with spouse independent prior to admission. Husband is a retired Field seismologist. Blood pressure 120/106. CT of the head and imaging revealed large left basal ganglia hemorrhage with no extension into the left lateral ventricle. MRI of the brain showing increased size of acute intraparenchymal hemorrhage centered at the left basal ganglion measuring 5.6 x 3.3 x 3.6 cm. Associated localized vasogenic edema and mass effect. No hydrocephalus. MRA of the head with no vascular abnormality or aneurysm underlying the left basal ganglia hemorrhage. No larger proximal arterial branch occlusion. Neurology follow-up conservative care and close monitoring of blood pressure. Follow-up CT of the head 08/08/2015 due to increased somnolence that showed left hemisphere hyperdense intra-axial hemorrhage stable to slightly increased from 08/06/2015. Surrounding edema and regional mass effect stable, including mild rightward midline shift of 4 mm. No new intracranial abnormality. EEG 08/09/2015 showed Mild focal slowing in the left temporal region with rare intermittent left frontotemporal abnormal epileptiform discharges in the form of sharps with phase reversals noted. She was placed on Keppra 500 mg twice a day 08/09/2015. Currently on a dysphagia #2 honey thick liquid diet after modified barium swallow. Subcutaneous heparin for DVT prophylaxis  added 08/09/2015. Bouts of agitation with Seroquel added but later discontinued secondary to somnolence. Mild leukocytosis 13,900 with urinalysis pending. Physical occupational therapy evaluations completed with recommendations of physical medicine  rehabilitation consult. Patient was admitted for a comprehensive rehabilitation program .  Patient transferred to CIR on 08/11/2015 .    Patient currently requires total with basic self-care skills secondary to muscle weakness, decreased cardiorespiratoy endurance, decreased coordination and decreased motor planning, R neglect, decreased initiation, decreased attention, decreased awareness, decreased problem solving and decreased safety awareness and decreased sitting balance, decreased standing balance, decreased postural control, hemiplegia and decreased balance strategies.  Prior to hospitalization, patient could complete ADLs and IADLs with independent .  Patient will benefit from skilled intervention to decrease level of assist with basic self-care skills prior to discharge home with care partner.  Anticipate patient will require 24 hour supervision and moderate physical assestance and follow up home health.  OT - End of Session Activity Tolerance: Decreased this session Endurance Deficit: Yes Endurance Deficit Description: pt unable to keep eyese open throughout session OT Assessment Rehab Potential (ACUTE ONLY): Fair Barriers to Discharge: Inaccessible home environment (none at this time) OT Patient demonstrates impairments in the following area(s): Balance;Cognition;Endurance;Motor;Safety;Perception;Sensory;Vision OT Basic ADL's Functional Problem(s): Eating;Grooming;Bathing;Dressing;Toileting OT Transfers Functional Problem(s): Toilet OT Additional Impairment(s): None;Fuctional Use of Upper Extremity OT Plan OT Intensity: Minimum of 1-2 x/day, 45 to 90 minutes OT Frequency: 5 out of 7 days OT Duration/Estimated Length of Stay: 25-28 days OT Treatment/Interventions: Balance/vestibular training;Functional electrical stimulation;Self Care/advanced ADL retraining;UE/LE Coordination activities;Cognitive remediation/compensation;Functional mobility training;Skin care/wound managment;Community  reintegration;Neuromuscular re-education;Wheelchair propulsion/positioning;Discharge planning;Pain management;Therapeutic Activities;Patient/family education;Therapeutic Exercise;DME/adaptive equipment instruction;Psychosocial support;UE/LE Strength taining/ROM OT Self Feeding Anticipated Outcome(s): n/a - tube feeding OT Basic Self-Care Anticipated Outcome(s): mod A OT Toileting Anticipated Outcome(s): mod A OT Bathroom Transfers Anticipated Outcome(s): mod A OT Recommendation Recommendations for Other Services: Other (comment) (none) Patient destination: [facility] (SNF) Follow Up Recommendations: Skilled nursing facility Equipment Recommended: To be determined   Skilled Therapeutic Intervention Upon entering the room, pt supine in bed with no signs or symptoms of pain. Pt very lethargic during this session and unable to keep eyes open for longer than 30 minutes at a time. Self care tasks performed from bed win supine position. Pt was able to wash face with set up A. Hand over hand assist for much of body. Supine >sit with total A to EOB. Pt with severe R lean in seated position , unable to lift head up, and requiring total A for balance. Pt returned to supine with total A. Pt able to follow 1 step verbal commands 50% of the time. Pt remained in bed with HOB elevated, bed alarm activated, and call bell within reach upon exiting the room.    OT Evaluation Precautions/Restrictions  Precautions Precautions: Fall Precaution Comments: right sided weakness, impulsivity, R neglect Restrictions Weight Bearing Restrictions: No General OT Amount of Missed Time: 15 Minutes Vital Signs Therapy Vitals Temp: 99.1 F (37.3 C) Temp Source: Oral Pulse Rate: 82 Resp: 16 BP: (!) 159/69 mmHg Patient Position (if appropriate): Lying Oxygen Therapy SpO2: 94 % O2 Device: Not Delivered Pain Pain Assessment Pain Assessment: Faces Faces Pain Scale: No hurt Home Living/Prior  Functioning Home Living Available Help at Discharge: Family, Available 24 hours/day Type of Home: House Home Access: Stairs to enter Technical brewer of Steps: 1 + 1 Home Layout: Two level, 1/2 bath on main level Alternate Level Stairs-Number of Steps: full flight  Bathroom Toilet: Standard Additional Comments: .  Family can hire assist if needed.  house has the capability of having an elevator installed - sons are contractors and can make the renovations. Information gathered from acute evaluation when family was present as no one present on this day of eval for confirmation  Lives With: Spouse Prior Function Level of Independence: Independent with basic ADLs, Independent with homemaking with ambulation, Independent with homemaking with wheelchair, Independent with gait Vision/Perception  Vision- Assessment Vision Assessment?: Vision impaired- to be further tested in functional context Additional Comments: language deficits make it difficult to fully assess. However, pt with head turn to the L and not visually scanning to R or turning head to R during evaluation.  Cognition Overall Cognitive Status: Impaired/Different from baseline Arousal/Alertness: Lethargic Orientation Level: Nonverbal/unable to assess Year: Other (Comment) (nonverbal/unable to assess) Day of Week: Other (Comment) (unable to assess) Memory: Impaired Immediate Memory Recall:  (unable to formally assess/nonverbal) Memory Recall:  (unable to assess/nonverbal) Attention: Focused;Sustained Focused Attention: Impaired Focused Attention Impairment: Functional basic Sustained Attention: Impaired Sustained Attention Impairment: Functional basic Awareness: Impaired Problem Solving: Impaired Executive Function:  (all impaired due to lower level deficits ) Sensation Sensation Light Touch: Impaired Detail Light Touch Impaired Details: Impaired RUE;Impaired RLE Stereognosis: Not tested Hot/Cold: Not  tested Proprioception: Impaired Detail Proprioception Impaired Details: Impaired RUE;Impaired RLE Coordination Gross Motor Movements are Fluid and Coordinated: No Fine Motor Movements are Fluid and Coordinated: No Motor  Motor Motor: Hemiplegia Mobility  Bed Mobility Bed Mobility: Rolling Right;Rolling Left;Sit to Supine;Supine to Sit Rolling Right: 1: +1 Total assist Rolling Left: 2: Max assist Supine to Sit: 1: +1 Total assist Sit to Supine: 1: +1 Total assist   Balance Dynamic Sitting Balance Sitting balance - Comments: Sitting on EOB with lateral lean to R requiring total A and unable to hold head up. Extremity/Trunk Assessment RUE Assessment RUE Assessment: Exceptions to [facility] (0/5, flaccid) LUE Assessment LUE Assessment: Within Functional Limits   See Function Navigator for Current Functional Status.   Refer to Care Plan for Long Term Goals  Recommendations for other services: None  Discharge Criteria: Patient will be discharged from OT if patient refuses treatment 3 consecutive times without medical reason, if treatment goals not met, if there is a change in medical status, if patient makes no progress towards goals or if patient is discharged from hospital.  The above assessment, treatment plan, treatment alternatives and goals were discussed and mutually agreed upon: No family available/patient unable  Phineas Semen 08/12/2015, 8:09 AM

## 2015-08-13 ENCOUNTER — Inpatient Hospital Stay (HOSPITAL_COMMUNITY): Payer: Medicare Other | Admitting: Physical Therapy

## 2015-08-13 ENCOUNTER — Inpatient Hospital Stay (HOSPITAL_COMMUNITY): Payer: Medicare Other | Admitting: *Deleted

## 2015-08-13 ENCOUNTER — Inpatient Hospital Stay (HOSPITAL_COMMUNITY): Payer: Medicare Other | Admitting: Speech Pathology

## 2015-08-13 LAB — GLUCOSE, CAPILLARY
GLUCOSE-CAPILLARY: 148 mg/dL — AB (ref 65–99)
GLUCOSE-CAPILLARY: 121 mg/dL — AB (ref 65–99)
Glucose-Capillary: 123 mg/dL — ABNORMAL HIGH (ref 65–99)
Glucose-Capillary: 141 mg/dL — ABNORMAL HIGH (ref 65–99)
Glucose-Capillary: 132 mg/dL — ABNORMAL HIGH (ref 65–99)

## 2015-08-13 MED ORDER — POTASSIUM CHLORIDE 20 MEQ/15ML (10%) PO SOLN
20.0000 meq | Freq: Every day | ORAL | Status: DC
  Administered 2015-08-13 – 2015-08-14 (×2): 20 meq
  Filled 2015-08-13 (×2): qty 15

## 2015-08-13 NOTE — Progress Notes (Signed)
Patient with multiple loose stools today maybe due to TF.  Decreased rate of TF to 35 ml/hr.  Patient will need new PRAFO boot ordered as previous one was soiled with stool.  Will continue to monitor.  Brita Romp, RN

## 2015-08-13 NOTE — Progress Notes (Signed)
Orthopedic Tech Progress Note Patient Details:  [name] 07/08/1940 [id number]  Ortho Devices Type of Ortho Device:  (prafo boot) Ortho Device/Splint Location: rle Ortho Device/Splint Interventions: Application   [name] 08/13/2015, 10:18 AM

## 2015-08-13 NOTE — Progress Notes (Signed)
Occupational Therapy Session Note  Patient Details  Name: [name] MRN: [id number] Date of Birth: 1940-05-22  Today's Date: 08/13/2015 OT Individual Time: 1100-1200 OT Individual Time Calculation (min): 60 min    Short Term Goals: Week 1:  OT Short Term Goal 1 (Week 1): Pt will participate in self care task for 2 minutes  in order to decrease level of assist with self care.  OT Short Term Goal 2 (Week 1): Pt will locate 3 items to the R during self care tasks by pointing to them in order to demonstrate visual scanning with mod cues. OT Short Term Goal 3 (Week 1): Pt will perform rolls to the left and right with max A or less during LB clothing management and hygiene n order to decrease level of care.  Skilled Therapeutic Interventions/Progress Updates:    Pt resting in w/c upon arrival.  Pt had large loose BM and required max A for SPT to bed for hygiene.  Pt was dependent + 3 for all hygiene tasks.  Pt required tot A for rolling in bed to facilitate cleaning up.  Pt did not initiate assisting with cleaning despite large amount of stool covering periarea and part of abdomen. Pt did not follow any one step commands during session.  Increased RUE tone noted at end of session.  Pt would wink her left eye when spoken to but did not appear to make any attempt to respond or carry out commands. Focus on activity tolerance, bed mobility, following one step commands, task initiation, and arousal.   Therapy Documentation Precautions:  Precautions Precautions: Fall Precaution Comments: right sided weakness, impulsivity, R neglect Restrictions Weight Bearing Restrictions: No  Pain:  Pt grimaced with head rotation; repositioned  See Function Navigator for Current Functional Status.   Therapy/Group: Individual Therapy  [name] 08/13/2015, 12:14 PM

## 2015-08-13 NOTE — Progress Notes (Signed)
Day 1 of 2: Calorie Count Note  48 hour calorie count ordered.  Diet: Dysphagia 1 diet with honey thick liquids  Breakfast: 0% intake Lunch: 0% intake Dinner: 13 kcal and 1 gram of protein Supplements: none  Day 1 Total intake: 13 kcal (1% of minimum estimated needs)  1 gram of protein (1% of minimum estimated needs)  Estimated Nutritional Needs:  Kcal: 1650-1850 Protein: 75-90 grams Fluid: 1.6 - 1.8 L/day  Nutrition Dx: Malnutrition related to acute illness as evidenced by energy intake < or equal to 50% for > or equal to 5 days, moderate depletions of muscle mass; ongoing  Goal:  Pt to meet >/= 90% of their estimated nutrition needs (only met with TF)  Intervention:   Continue TF as pt with poor po: Jevity 1.2 formula via [place] NGT at goal rate of 65 ml/hr x 20 hours (may hold for up to 4 hours for therapy) with 30 ml Prostat once daily to provide 1660 kcal (100% of needs), 87 grams of protein, and 1053 ml of free water.   Recommend monitoring Magnesium, Phosphorus, and Potassium levels as pt may be at risk for refeeding syndrome. Noted potassium is low.   RD to follow up tomorrow with day 2 calorie count results.  Corrin Parker, MS, RD, LDN Pager # [phone] After hours/ weekend pager # [phone]

## 2015-08-13 NOTE — Evaluation (Signed)
Speech Language Pathology Bedside Swallow Evaluation & Session Note   Patient Details  Name: [name] MRN: [id number] Date of Birth: 10-27-1940  SLP Diagnosis: Dysphagia  Rehab Potential: Good ELOS: 3-4 weeks     Today's Date: 08/13/2015 SLP Individual Time: 1000-1100 SLP Individual Time Calculation (min): 60 min   Problem List:  Patient Active Problem List   Diagnosis Date Noted  . 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 Hypertensive emergency 08/11/2015  . Incontinence 08/11/2015  . Hypokalemia 08/11/2015  . Protein-calorie malnutrition ([place]) 08/11/2015  . 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 Aphasia, post-stroke   . Dysphagia, post-stroke   . Dysphasia, post-stroke   . 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 Cognitive deficit, post-stroke   . Seizure prophylaxis   . Leukocytosis   . Benign essential HTN   . HLD (hyperlipidemia)   . Lethargy   . Altered mental status   . Cytotoxic brain edema ([place]) 08/05/2015  . Brain herniation ([place]) 08/05/2015  . ICH (intracerebral hemorrhage) ([place]) 08/01/2015  . Hepatitis 08/12/2010  . Renal failure 08/12/2010  . 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 CONJUNCTIVITIS, ALLERGIC 09/02/2008  . 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 ALLERGIC RHINITIS 09/02/2008  . BRONCHITIS 09/02/2008   Past Medical History:  Past Medical History  Diagnosis Date  . Bronchitis, not specified as acute or chronic   . Other chronic sinusitis   . Other chronic allergic conjunctivitis   . Allergic rhinitis, cause unspecified   . 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 Acute renal insufficiency     due to amphotericin 2012  . Irritable bowel   . Hypertension   . Diverticulosis    Past Surgical History:  Past Surgical History  Procedure Laterality Date  . Cesarean section      x 4  . Vesicovaginal fistula closure w/ tah    . Breast lumpectomy      Right-benign  . Tonsillectomy    . Appendectomy    .  Bronchoscopy      Video bronch w/ TBBX 2012    Assessment / Plan / Recommendation Clinical Impression Patient was administered a BSE. Patient consumed limited amounts of Dys. 1 textures with honey-thick liquids via cup. Patient demonstrated oral holding with prolonged AP transit with what appeared to be a delayed swallow initiation and right anterior spillage. Patient required intermittent Max A verbal cues to initiate a swallow at times but did not demonstrate any overt s/s of aspiration. Dys. 2 textures were not attempted due to fatigue and severe oral dysphagia, therefore, recommend patient downgrade to Dys. 1 textures and continue honey-thick liquids with full supervision from staff. Spoke to the husband about her current swallowing function and diet recommendations as well as the probable need for the NG tube for an extended period of time due to poor intake and inability to meet her nutritional needs by mouth, he verbalized understanding. Patient would benefit from skilled SLP intervention to maximize her swallowing function with the least restrictive diet in order to maximize her functional independence prior to discharge.    Skilled Therapeutic Interventions          Administered a BSE. Please see above for details. SLP also facilitated session by providing total A multimodal cues for patient to answer yes/no questions while utilizing gestures and written aids at the word level. Patient also required Max A multimodal cues to identify functional items from a field of 2 with 40% accuracy. Patient  was nonverbal throughout the session despite Max A multimodal cues. Patient became lethargic at end of session and required Max A verbal cues to maintain arousal for ~30 seconds. Patient was incontinent of bowel and was transferred back to bed with +2 assistance and left with OT.    SLP Assessment  Patient will need skilled Speech Lanaguage Pathology Services during CIR admission    Recommendations  SLP  Diet Recommendations: Dysphagia 1 (Puree);Honey Liquid Administration via: Cup;No straw Medication Administration: Via alternative means Supervision: Patient able to self feed;Full supervision/cueing for compensatory strategies;Staff to assist with self feeding Compensations: Minimize environmental distractions;Slow rate;Small sips/bites;Lingual sweep for clearance of pocketing Postural Changes and/or Swallow Maneuvers: Seated upright 90 degrees Oral Care Recommendations: Oral care BID Patient destination: Home Follow up Recommendations: Home Health SLP;24 hour supervision/assistance Equipment Recommended: To be determined    SLP Frequency 3 to 5 out of 7 days   SLP Duration  SLP Intensity  SLP Treatment/Interventions 3-4 weeks   Minumum of 1-2 x/day, 30 to 90 minutes  Cueing hierarchy;Functional tasks;Patient/family education;Environmental controls;Internal/external aids;Dysphagia/aspiration precaution training;Therapeutic Activities    Pain Pain Assessment Pain Assessment: Faces Faces Pain Scale: No hurt Pain Type: Acute pain Pain Location: Neck Pain Orientation: Right Pain Descriptors / Indicators: Grimacing;Aching;Discomfort;Guarding Pain Onset: With Activity Pain Intervention(s): Repositioned;RN made aware;Rest  Prior Functioning Type of Home: House  Lives With: Spouse Available Help at Discharge: Family;Available 24 hours/day  Function:  Eating Eating   Modified Consistency Diet: Yes Eating Assist Level: Helper scoops food on utensil;Supervision or verbal cues;Set up assist for;More than reasonable amount of time   Eating Set Up Assist For: Opening containers       Cognition Comprehension Comprehension assist level: Understands basic less than 25% of the time/ requires cueing >75% of the time  Expression   Expression assist level: Expresses basis less than 25% of the time/requires cueing >75% of the time.  Social Interaction Social Interaction assist level:  Interacts appropriately less than 25% of the time. May be withdrawn or combative.  Problem Solving Problem solving assist level: Solves basic less than 25% of the time - needs direction nearly all the time or does not effectively solve problems and may need a restraint for safety  Memory Memory assist level: Recognizes or recalls less than 25% of the time/requires cueing greater than 75% of the time   Short Term Goals: Week 1: SLP Short Term Goal 1 (Week 1): Patient will maintain arousal in regards to keeping her eyes open for ~60 seconds with Max A multimodal cues.  SLP Short Term Goal 2 (Week 1): Patient will follow basic 1 step commands in 50% of opporunities with Max A multimodal cues.  SLP Short Term Goal 3 (Week 1): Patient will answer basic yes/no questions utilizing multimodal communication with 50% accuracy with Max A multimodal cues.  SLP Short Term Goal 4 (Week 1): Patient will consume current diet without overt s/s of aspiration with Mod A verbal cues for use of swallowing compensatory strategies.   Refer to Care Plan for Long Term Goals  Recommendations for other services: None  Discharge Criteria: Patient will be discharged from SLP if patient refuses treatment 3 consecutive times without medical reason, if treatment goals not met, if there is a change in medical status, if patient makes no progress towards goals or if patient is discharged from hospital.  The above assessment, treatment plan, treatment alternatives and goals were discussed and mutually agreed upon: by family  Vika Buske, [place] 08/13/2015, 2:22  PM   

## 2015-08-13 NOTE — Evaluation (Signed)
Physical Therapy Assessment and Plan  Patient Details  Name: [name] MRN: [id number] Date of Birth: [birth year]  PT Diagnosis: Abnormal posture, Abnormality of gait, Cognitive deficits, Coordination disorder, Difficulty walking, Hemiplegia dominant, Hypotonia, Impaired sensation, Muscle weakness and Pain in neck Rehab Potential: Fair ELOS: 25-28 days   Today's Date: 08/13/2015 PT Co-treatment Time: 9323-5573 PT Co-treatment Time Calculation (min): 45 min    Problem List:  Patient Active Problem List   Diagnosis Date Noted  . 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 Hypertensive emergency 08/11/2015  . Incontinence 08/11/2015  . Hypokalemia 08/11/2015  . Protein-calorie malnutrition ([place]) 08/11/2015  . 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 Aphasia, post-stroke   . Dysphagia, post-stroke   . Dysphasia, post-stroke   . 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 Cognitive deficit, post-stroke   . Seizure prophylaxis   . Leukocytosis   . Benign essential HTN   . HLD (hyperlipidemia)   . Lethargy   . Altered mental status   . Cytotoxic brain edema ([place]) 08/05/2015  . Brain herniation ([place]) 08/05/2015  . ICH (intracerebral hemorrhage) ([place]) 08/01/2015  . Hepatitis 08/12/2010  . Renal failure 08/12/2010  . 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 CONJUNCTIVITIS, ALLERGIC 09/02/2008  . 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 ALLERGIC RHINITIS 09/02/2008  . BRONCHITIS 09/02/2008    Past Medical History:  Past Medical History  Diagnosis Date  . Bronchitis, not specified as acute or chronic   . Other chronic sinusitis   . Other chronic allergic conjunctivitis   . Allergic rhinitis, cause unspecified   . 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 Acute renal insufficiency     due to amphotericin 2012  . Irritable bowel   . Hypertension   . Diverticulosis    Past Surgical History:  Past Surgical History  Procedure Laterality Date  . Cesarean  section      x 4  . Vesicovaginal fistula closure w/ tah    . Breast lumpectomy      Right-benign  . Tonsillectomy    . Appendectomy    . Bronchoscopy      Video bronch w/ TBBX 2012    Assessment & Plan Clinical Impression: [name] is a 75 y.o. right handed female with history of hypertension. Presented 08/01/2015 with acute onset of aphasia and right-sided weakness. Patient lives with spouse independent prior to admission. Husband is a retired Field se[employer]ologist. Blood pressure 120/106. CT of the head and imaging revealed large left basal ganglia hemorrhage with no extension into the left lateral ventricle. MRI of the brain showing increased size of acute intraparenchymal hemorrhage centered at the left basal ganglion measuring 5.6 x 3.3 x 3.6 cm. Associated localized vasogenic edema and mass effect. No hydrocephalus. MRA of the head with no vascular abnormality or aneurysm underlying the left basal ganglia hemorrhage. No larger proximal arterial branch occlusion. Neurology follow-up conservative care and close monitoring of blood pressure. Follow-up CT of the head 08/08/2015 due to increased somnolence that showed left hemisphere hyperdense intra-axial hemorrhage stable to slightly increased from 08/06/2015. Surrounding edema and regional mass effect stable, including mild rightward midline shift of 4 mm. No new intracranial abnormality. EEG 08/09/2015 showed Mild focal slowing in the left temporal region with rare intermittent left frontotemporal abnormal epileptiform discharges in the form of sharps with phase reversals noted. She was placed on Keppra 500 mg twice a day 08/09/2015. Currently on a dysphagia #2 honey thick liquid diet after modified  barium swallow. Subcutaneous heparin for DVT prophylaxis added 08/09/2015. Bouts of agitation with Seroquel added but later discontinued secondary to somnolence. Mild leukocytosis 13,900 with urinalysis pending.  Patient transferred to CIR on  08/11/2015.   Patient currently requires total with mobility secondary to muscle weakness, muscle joint tightness and muscle paralysis, decreased cardiorespiratoy endurance, impaired timing and sequencing, abnormal tone, unbalanced muscle activation and decreased coordination, decreased midline orientation, decreased attention to right and right side neglect, decreased initiation, decreased attention, decreased awareness, decreased problem solving, decreased safety awareness, decreased memory and delayed processing and decreased sitting balance, decreased standing balance, decreased postural control, hemiplegia and decreased balance strategies.  Prior to hospitalization, patient was independent  with mobility and lived with Spouse in a House home.  Home access is 1 + 1Stairs to enter.  Patient will benefit from skilled PT intervention to maximize safe functional mobility, minimize fall risk and decrease caregiver burden for planned discharge home with 24 hour assist.  Anticipate patient will benefit from follow up [facility] at discharge.  PT - End of Session Activity Tolerance: Decreased this session;Tolerates 30+ min activity with multiple rests Endurance Deficit: Yes Endurance Deficit Description: patient lethargic, fatigued throughout session PT Assessment Rehab Potential (ACUTE/IP ONLY): Fair Barriers to Discharge: Inaccessible home environment PT Patient demonstrates impairments in the following area(s): Balance;Behavior;Edema;Endurance;Motor;Nutrition;Pain;Perception;Safety;Sensory PT Transfers Functional Problem(s): Bed Mobility;Bed to Chair;Car;Furniture PT Locomotion Functional Problem(s): Ambulation;Wheelchair Mobility;Stairs PT Plan PT Intensity: Minimum of 1-2 x/day ,45 to 90 minutes PT Frequency: 5 out of 7 days PT Duration Estimated Length of Stay: 25-28 days PT Treatment/Interventions: Ambulation/gait training;Balance/vestibular training;Cognitive remediation/compensation;Community  reintegration;Discharge planning;Disease management/prevention;DME/adaptive equipment instruction;Functional electrical stimulation;Functional mobility training;Neuromuscular re-education;Pain management;Patient/family education;Psychosocial support;Splinting/orthotics;Stair training;Therapeutic Activities;Therapeutic Exercise;UE/LE Strength taining/ROM;UE/LE Coordination activities;Visual/perceptual remediation/compensation;Wheelchair propulsion/positioning PT Transfers Anticipated Outcome(s): mod A PT Locomotion Anticipated Outcome(s): mod A wheelchair level PT Recommendation Follow Up Recommendations: Home health PT;24 hour supervision/assistance Patient destination: Home Equipment Recommended: To be determined  Skilled Therapeutic Intervention Skilled therapeutic intervention initiated after completion of evaluation. Skilled co-treatment with SLP with focus on arousal, attention, initiation, and command following with functional mobility. Patient awake upon arrival, incontinent of loose bowel movement upon arrival, performed rolling with max-total A for total A hygiene. Patient sat edge of bed x 20 min with mod-max A for sitting balance and performed bed mobility and squat pivot transfer to wheelchair with +2A for safety. Patient required mod verbal/visual cues to track to midline/past midline to R with gaze only and was unable to turn her head to R due to decreased AROM/PROM cervical rotation to R and grimacing in pain with minimal movement. RN notified. Patient maintained arousal with Mod I but remained nonverbal throughout session. Patient left in tilt-in-space wheelchair with mit and wrist restraint on her LUE with family member present.   PT Evaluation Precautions/Restrictions Precautions Precautions: Fall Precaution Comments: R hemiplegia, R neglect Restrictions Weight Bearing Restrictions: No General Chart Reviewed: Yes Family/Caregiver Present: No  Pain Pain Assessment Pain  Assessment: Faces Pain Type: Acute pain Pain Location: Neck Pain Orientation: Right Pain Descriptors / Indicators: Grimacing;Aching;Discomfort;Guarding Pain Onset: With Activity Pain Intervention(s): Repositioned;RN made aware;Rest Home Living/Prior Functioning Home Living Available Help at Discharge: Family;Available 24 hours/day Type of Home: House Home Access: Stairs to enter [employer] of Steps: 1 + 1 Home Layout: Two level;1/2 bath on main level Alternate Level Stairs-Number of Steps: full flight  Bathroom Toilet: Standard Additional Comments: From medical chart  Lives With: Spouse Prior Function Level of Independence: Independent with basic ADLs;Independent with homemaking with  ambulation;Independent with homemaking with wheelchair;Independent with gait  Able to Take Stairs?: Yes Driving: Yes Leisure: Hobbies-yes (Comment) Comments: enjoyed exercising Vision/Perception   See OT evaluation  Cognition Overall Cognitive Status: Impaired/Different from baseline Arousal/Alertness: Lethargic Orientation Level: Disoriented X4 Attention: Focused;Sustained Focused Attention: Impaired Focused Attention Impairment: Functional basic Sustained Attention: Impaired Sustained Attention Impairment: Functional basic Memory: Impaired Awareness: Impaired Problem Solving: Impaired Executive Function:  (all impaired due to lower level deficits ) Sensation Sensation Light Touch: Impaired Detail Light Touch Impaired Details: Impaired RUE;Impaired RLE Stereognosis: Not tested Hot/Cold: Not tested Proprioception: Impaired Detail Proprioception Impaired Details: Impaired RUE;Impaired RLE Coordination Gross Motor Movements are Fluid and Coordinated: No Fine Motor Movements are Fluid and Coordinated: No Coordination and Movement Description: R hemiplegia Motor  Motor Motor: Hemiplegia  Mobility Bed Mobility Bed Mobility: Rolling Right;Rolling Left;Sit to Supine;Supine to  Sit Rolling Right: 2: Max assist Rolling Left: 1: +1 Total assist Supine to Sit: 1: +1 Total assist Transfers Transfers: Yes Squat Pivot Transfers: 1: +2 Total assist;With armrests Locomotion  Ambulation Ambulation: No Gait Gait: No Stairs / Additional Locomotion Stairs: No Wheelchair Mobility Wheelchair Mobility: No (TIS wheelchair)  Trunk/Postural Assessment  Cervical Assessment Cervical Assessment: Exceptions to [facility] (forward head, limited AROM/PROM and painful with cervical rotation to R) Thoracic Assessment Thoracic Assessment: Exceptions to [facility] (forward flexed) Lumbar Assessment Lumbar Assessment: Exceptions to [facility] (posterior pelvic tilt) Postural Control Postural Control: Deficits on evaluation Protective Responses: absent  Balance Balance Balance Assessed: Yes Static Sitting Balance Static Sitting - Balance Support: Feet supported;Left upper extremity supported Static Sitting - Level of Assistance: 2: Max assist;1: +1 Total assist Static Standing Balance Static Standing - Balance Support: During functional activity;Bilateral upper extremity supported Static Standing - Level of Assistance: 1: +2 Total assist Extremity Assessment  RUE Assessment RUE Assessment: Exceptions to WFL (0/5, flaccid) LUE Assessment LUE Assessment: Within Functional Limits RLE Assessment RLE Assessment: Exceptions to [facility] RLE Strength RLE Overall Strength: Deficits RLE Overall Strength Comments: 0/5 throughout RLE Tone RLE Tone: Flaccid LLE Assessment LLE Assessment: Within Functional Limits   See Function Navigator for Current Functional Status.   Refer to Care Plan for Long Term Goals  Recommendations for other services: None  Discharge Criteria: Patient will be discharged from PT if patient refuses treatment 3 consecutive times without medical reason, if treatment goals not met, if there is a change in medical status, if patient makes no progress towards goals or if patient is  discharged from hospital.  The above assessment, treatment plan, treatment alternatives and goals were discussed and mutually agreed upon: No family available/patient unable  [name] 08/13/2015, 12:43 PM

## 2015-08-13 NOTE — Progress Notes (Addendum)
Speech Language Pathology Daily Session Note  Patient Details  Name: [name] MRN: [id number] Date of Birth: 12-16-1940  Today's Date: 08/13/2015 SLP Co-Treatment Time: [phone] (co-tx with PT (RV) 0800-0900) SLP Co-Treatment Time Calculation (min): 15 min  Short Term Goals: Week 1: SLP Short Term Goal 1 (Week 1): Patient will maintain arousal in regards to keeping her eyes open for ~60 seconds with Max A multimodal cues.  SLP Short Term Goal 2 (Week 1): Patient will follow basic 1 step commands in 50% of opporunities with Max A multimodal cues.  SLP Short Term Goal 3 (Week 1): Patient will answer basic yes/no questions utilizing multimodal communication with 50% accuracy with Max A multimodal cues.   Skilled Therapeutic Interventions: Skilled co-treatment with PT focus on cognitive-linguistic goals and mobility. SLP facilitated session by providing Max A verbal, visual and tactile cues for patient to follow 1 step commands in 50% of opportunities. Patient was nonverbal throughout the session and required total A multimodal cues to answer yes/no questions in regards to basic wants/needs utilizing gestures or written cues. Patient maintained arousal throughout the session with Mod I and required Mod A verbal and visual cues to track past midline to the right but was unable to turn her head to the right despite total A due to pain. RN made aware. Patient left upright while reclined in tilt-in-space wheelchair with mit and wrist restraint on her LUE with family present. Continue with current plan of care.   Function:   Cognition Comprehension Comprehension assist level: Understands basic less than 25% of the time/ requires cueing >75% of the time  Expression   Expression assist level: Expresses basis less than 25% of the time/requires cueing >75% of the time.  Social Interaction Social Interaction assist level: Interacts appropriately less than 25% of the time. May be withdrawn or  combative.  Problem Solving Problem solving assist level: Solves basic less than 25% of the time - needs direction nearly all the time or does not effectively solve problems and may need a restraint for safety  Memory Memory assist level: Recognizes or recalls less than 25% of the time/requires cueing greater than 75% of the time    Pain Pain Assessment Pain Assessment: Faces Faces Pain Scale: No hurt Pain Type: Acute pain Pain Location: Neck Pain Orientation: Right Pain Descriptors / Indicators: Grimacing;Aching;Discomfort;Guarding Pain Onset: With Activity Pain Intervention(s): Repositioned;RN made aware;Rest  Therapy/Group: Individual Therapy  Albert Devaul 08/13/2015, 2:10 PM

## 2015-08-13 NOTE — Progress Notes (Signed)
[facility] PHYSICAL MEDICINE & REHABILITATION     PROGRESS NOTE    Subjective/Complaints: Had a fairly uneventful night. Patient awake and alert when I arrived.  ROS limited due to cognitive status  Objective: Vital Signs: Blood pressure 160/81, pulse 76, temperature 98.9 F (37.2 C), temperature source Oral, resp. rate 18, SpO2 99 %. Dg Chest 1 View  08/12/2015  CLINICAL DATA:  Fever. EXAM: CHEST 1 VIEW COMPARISON:  08/09/2015 FINDINGS: There is a feeding tube which is looped within the stomach. Normal heart size. No pleural effusion or edema identified. No airspace consolidation noted. IMPRESSION: 1. No evidence for pneumonia Electronically Signed   By: Kerby Moors M.D.   On: 08/12/2015 11:43    Recent Labs  08/11/15 0354 08/12/15 0742  WBC 13.9* 11.9*  HGB 12.1 11.5*  HCT 35.3* 33.5*  PLT 298 347    Recent Labs  08/11/15 0354 08/12/15 0742  NA 137 138  K 3.6 3.1*  CL 105 109  GLUCOSE 80 130*  BUN 12 11  CREATININE 0.72 0.62  CALCIUM 8.8* 8.6*   CBG (last 3)   Recent Labs  08/12/15 1703 08/13/15 0748  GLUCAP 135* 148*    Wt Readings from Last 3 Encounters:  08/05/15 64.5 kg (142 lb 3.2 oz)  06/14/11 64.501 kg (142 lb 3.2 oz)  02/03/11 61.598 kg (135 lb 12.8 oz)    Physical Exam:  Constitutional: She appears well-developed, well nourished. NAD. HENT:  Head: Normocephalic. Atraumatic.  Eyes:  Pupils reactive to light  Neck: Normal range of motion. Neck supple. No thyromegaly present.  Cardiovascular: Normal rate and regular rhythm. + systolic Murmur Respiratory: Effort normal and breath sounds normal. No respiratory distress. no wheezes or rales GI: Soft. Bowel sounds are normal. She exhibits no distension.  Neurological:  More alert. Makes more direct eye contact. Non-verbal. Squeezes with left hand  DTRs 3+ right upper and right lower extremity Unable to assess sensation. Motor:  Right upper extremity and right lower extremity appear  to be 0/5 Left upper and left lower extremity potentially 3/5 or more (patient not participating in MMT) + Babinski bilaterally. + Clonus right lower extremity, extensor tone.  Flexor tone RUE Skin: Skin is warm and dry.   Assessment/Plan: 1. 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 Physiatrist is providing close team supervision and 24 hour management of active medical problems listed below. Physiatrist and rehab team continue to assess barriers to discharge/monitor patient progress toward functional and medical goals.  Function:  Bathing Bathing position   Position: Bed  Bathing parts Body parts bathed by patient: Chest, Abdomen Body parts bathed by helper: Right arm, Left arm, Front perineal area, Buttocks, Right upper leg, Left upper leg, Right lower leg, Left lower leg, Back  Bathing assist Assist Level:  (TOTAL a)      Upper Body Dressing/Undressing Upper body dressing   What is the patient wearing?: Hospital gown                Upper body assist Assist Level:  (total A)      Lower Body Dressing/Undressing Lower body dressing   What is the patient wearing?: Hospital Gown, [place], Non-skid slipper socks           Non-skid slipper socks- Performed by helper: Don/doff right sock, Don/doff left sock  TED Hose - Performed by helper: Don/doff right TED hose, Don/doff left TED hose  Lower body assist        Toileting Toileting Toileting activity did not occur: Safety/medical concerns        Toileting assist     Transfers Chair/bed transfer Chair/bed transfer activity did not occur: Safety/medical concerns           Locomotion Ambulation Ambulation activity did not occur: Safety/medical concerns         Wheelchair          Cognition Comprehension Comprehension assist level: Understands  basic less than 25% of the time/ requires cueing >75% of the time  Expression Expression assist level: Expresses basis less than 25% of the time/requires cueing >75% of the time.  Social Interaction Social Interaction assist level: Interacts appropriately less than 25% of the time. May be withdrawn or combative.  Problem Solving Problem solving assist level: Solves basic less than 25% of the time - needs direction nearly all the time or does not effectively solve problems and may need a restraint for safety  Memory Memory assist level: Recognizes or recalls less than 25% of the time/requires cueing greater than 75% of the time   Medical Problem List and Plan: 1. 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 DVT Prophylaxis/Anticoagulation: Subcutaneous heparin initiated 08/09/2015 3. Pain Management: Hydrocodone as needed 4. Dysphagia. Dysphagia #2 honey thick liquids.  -continue IV fluids for hydration.   -NGT inserted  -left wrist restraint required to keep NGT in place 5. Neuropsych: This patient is not capable of making decisions on her own behalf. 6. Skin/Wound Care: Routine skin checks 7. Decreased nutritional storage. Dietary follow-up. Check calorie counts  -NGT placed 8. Fluids/Electrolytes/Nutrition: following lytes, supplementing PO with NGT until more alert  -recheck labs tomorrow  -RD adjusted temporary goal rate I had set 9. Seizure prophylaxis. Keppra 500 mg twice a day 10. Leukocytosis. Follow-up urine study/follow-up CBC 11. Hypertension. Cozaar 100 mg daily. Monitor with increased mobility 12. Hyperlipidemia. Lipitor 13. Lethargy post stroke:   -will review with husband regarding a stimulant 14. Low grade temp resolved  -ua unremarkable. cx not performed---given defervescence will hold on cx  -CXR clear 15. Spasticity: PRAFO RLE  -continue ROM/modalities  -would like to avoid  antispasmodics due to arousal level   LOS (Days) 2 A FACE TO FACE EVALUATION WAS PERFORMED  SWARTZ,ZACHARY T 08/13/2015 8:45 AM

## 2015-08-13 NOTE — Progress Notes (Addendum)
Physical Therapy Session Note  Patient Details  Name: CORINNA MARBRY MRN: [id number] Date of Birth: 1940-12-31  Today's Date: 08/13/2015 PT Individual Time: 1415-1445 PT Individual Time Calculation (min): 30 min   Short Term Goals: Week 1:  PT Short Term Goal 1 (Week 1): Patient will maintain arousal for 60 min consistently with min multimodal cues. PT Short Term Goal 2 (Week 1): Patient will follow basic 1 step commands in 75% of available opportunities.  PT Short Term Goal 3 (Week 1): Patient will maintain sitting balance x 5 min with supervision.  PT Short Term Goal 4 (Week 1): Patient will initiate ambulation.   Skilled Therapeutic Interventions/Progress Updates:  Patient asleep in bed, aroused to voice and light touch and required min verbal cues to keep eyes open during session. Session focused on bed mobility with total A and static sitting balance edge of bed with initial mod-max A with total A for LUE placement to L to facilitate midline positioning progressed to supervision for 3 trials of 40 sec, 2 min, and 3 min with assist for initial LUE placement. Patient with LOB anterior and to R whenever she would move her LUE with no protective reaction noted. Patient maintained forward flexed posture seated EOB despite max verbal/manual cues. Patient unable to follow commands for LUE reaching or scanning past midline to R. Patient left semi reclined in bed with PRAFO RLE and all needs in reach, bed alarm on.   Therapy Documentation Precautions:  Precautions Precautions: Fall Precaution Comments: R hemiplegia, R neglect Restrictions Weight Bearing Restrictions: No Pain: Pain Assessment Pain Assessment: Faces Faces Pain Scale: No hurt Pain Type: Acute pain Pain Location: Neck Pain Orientation: Right Pain Descriptors / Indicators: Grimacing;Aching;Discomfort;Guarding Pain Onset: With Activity Pain Intervention(s): Repositioned;RN made aware;Rest   See Function Navigator for  Current Functional Status.   Therapy/Group: Individual Therapy  [name] 08/13/2015, 2:49 PM

## 2015-08-14 ENCOUNTER — Inpatient Hospital Stay (HOSPITAL_COMMUNITY): Payer: Medicare Other

## 2015-08-14 ENCOUNTER — Inpatient Hospital Stay (HOSPITAL_COMMUNITY): Payer: Medicare Other | Admitting: Physical Therapy

## 2015-08-14 ENCOUNTER — Inpatient Hospital Stay (HOSPITAL_COMMUNITY): Payer: Medicare Other | Admitting: Speech Pathology

## 2015-08-14 ENCOUNTER — Inpatient Hospital Stay (HOSPITAL_COMMUNITY): Payer: Medicare Other | Admitting: Occupational Therapy

## 2015-08-14 DIAGNOSIS — R400 Somnolence: Secondary | ICD-10-CM | POA: Insufficient documentation

## 2015-08-14 LAB — GLUCOSE, CAPILLARY
GLUCOSE-CAPILLARY: 146 mg/dL — AB (ref 65–99)
GLUCOSE-CAPILLARY: 139 mg/dL — AB (ref 65–99)
GLUCOSE-CAPILLARY: 118 mg/dL — AB (ref 65–99)
GLUCOSE-CAPILLARY: 111 mg/dL — AB (ref 65–99)
Glucose-Capillary: 131 mg/dL — ABNORMAL HIGH (ref 65–99)
Glucose-Capillary: 126 mg/dL — ABNORMAL HIGH (ref 65–99)
Glucose-Capillary: 87 mg/dL (ref 65–99)

## 2015-08-14 LAB — BASIC METABOLIC PANEL
Anion gap: 11 (ref 5–15)
BUN: 8 mg/dL (ref 6–20)
CALCIUM: 8.7 mg/dL — AB (ref 8.9–10.3)
CHLORIDE: 105 mmol/L (ref 101–111)
CO2: 24 mmol/L (ref 22–32)
CREATININE: 0.55 mg/dL (ref 0.44–1.00)
GFR calc non Af Amer: >60 mL/min (ref >60)
Glucose, Bld: 135 mg/dL — ABNORMAL HIGH (ref 65–99)
Potassium: 3 mmol/L — ABNORMAL LOW (ref 3.5–5.1)
SODIUM: 140 mmol/L (ref 135–145)

## 2015-08-14 MED ORDER — PSYLLIUM 95 % PO PACK
1.0000 | PACK | Freq: Every day | ORAL | Status: DC
  Administered 2015-08-14 – 2015-09-03 (×16): 1
  Filled 2015-08-14 (×21): qty 1

## 2015-08-14 MED ORDER — POTASSIUM CHLORIDE 20 MEQ/15ML (10%) PO SOLN
20.0000 meq | Freq: Three times a day (TID) | ORAL | Status: DC
  Administered 2015-08-14 – 2015-08-16 (×5): 20 meq
  Filled 2015-08-14 (×9): qty 15

## 2015-08-14 MED ORDER — VITAL 1.5 CAL PO LIQD
1000.0000 mL | ORAL | Status: DC
  Administered 2015-08-14 – 2015-08-18 (×2): 1000 mL
  Filled 2015-08-14 (×8): qty 1000

## 2015-08-14 MED ORDER — METHYLPHENIDATE HCL 5 MG PO TABS
5.0000 mg | ORAL_TABLET | Freq: Two times a day (BID) | ORAL | Status: DC
  Administered 2015-08-14 – 2015-08-19 (×10): 5 mg via ORAL
  Filled 2015-08-14 (×10): qty 1

## 2015-08-14 NOTE — Progress Notes (Addendum)
Physical Therapy Session Note  Patient Details  Name: [name] MRN: [id number] Date of Birth: Mar 01, 1941  Today's Date: 08/14/2015 PT Individual Time: [id number] PT Individual Time Calculation (min): 42 min   Short Term Goals: Week 1:  PT Short Term Goal 1 (Week 1): Patient will maintain arousal for 60 min consistently with min multimodal cues. PT Short Term Goal 2 (Week 1): Patient will follow basic 1 step commands in 75% of available opportunities.  PT Short Term Goal 3 (Week 1): Patient will maintain sitting balance x 5 min with supervision.  PT Short Term Goal 4 (Week 1): Patient will initiate ambulation.   Skilled Therapeutic Interventions/Progress Updates:   Patient asleep in bed with head turned to R upon arrival. Patient did not open eyes throughout session despite max multimodal cues for arousal, however she would grimace with any movement of neck when repositioned to midline. Patient did not follow any commands this session. Patient provided cool wash cloth with LUE hand over hand assist to wash face in supine and sitting positions. Attempted auditory stimulation and noxious stimuli without success. RN aware and vitals WFL. Attempted to stimulate arousal by changing positions and transitioned supine > sit edge of bed with total A, max-total A static sitting balance, and squat pivot transfer to TIS wheelchair with total A x 2. Patient positioned to midline in TIS wheelchair with total A and left with quick release belt, L mitt and wrist restraint donned and RN notified of patient position.   Therapy Documentation Precautions:  Precautions Precautions: Fall Precaution Comments: R hemiplegia, R neglect Restrictions Weight Bearing Restrictions: No General: PT Amount of Missed Time (min): 18 Minutes PT Missed Treatment Reason: Patient fatigue;Other (Comment) (decreased arousal) Pain: Pain Assessment Pain Assessment: Faces Faces Pain Scale: Hurts even more Pain Type: Acute  pain Pain Location: Neck Pain Orientation: Right Pain Descriptors / Indicators: Grimacing;Aching Pain Onset: With Activity Pain Intervention(s): Repositioned;Rest  See Function Navigator for Current Functional Status.   Therapy/Group: Individual Therapy  [name] 08/14/2015, 8:47 AM

## 2015-08-14 NOTE — Progress Notes (Signed)
Occupational Therapy Session Note  Patient Details  Name: [name] MRN: [id number] Date of Birth: [birth year]  Today's Date: 08/14/2015 OT Missed Time: 109 Minutes Missed Time Reason: CT/MRI   Short Term Goals: Week 1:  OT Short Term Goal 1 (Week 1): Pt will participate in self care task for 2 minutes  in order to decrease level of assist with self care.  OT Short Term Goal 2 (Week 1): Pt will locate 3 items to the R during self care tasks by pointing to them in order to demonstrate visual scanning with mod cues. OT Short Term Goal 3 (Week 1): Pt will perform rolls to the left and right with max A or less during LB clothing management and hygiene n order to decrease level of care.  Skilled Therapeutic Interventions/Progress Updates:    Pt was out for CT scan the entire length of scheduled OT session.  Therapy Documentation Precautions:  Precautions Precautions: Fall Precaution Comments: R hemiplegia, R neglect Restrictions Weight Bearing Restrictions: No    Vital Signs: Therapy Vitals Temp: 98 F (36.7 C) Temp Source: Oral Pulse Rate: 78 Resp: 18 BP: 132/63 mmHg Patient Position (if appropriate): Lying Oxygen Therapy SpO2: 98 % O2 Device: Not Delivered    See Function Navigator for Current Functional Status.   Therapy/Group: Individual Therapy  Shakima Nisley 08/14/2015, 8:28 AM

## 2015-08-14 NOTE — Progress Notes (Signed)
[facility] PHYSICAL MEDICINE & REHABILITATION     PROGRESS NOTE    Subjective/Complaints: A lot of loose stools yesterday and overnight. Pt slept fairly well despite these.  ROS limited due to cognitive status  Objective: Vital Signs: Blood pressure 132/63, pulse 78, temperature 98 F (36.7 C), temperature source Oral, resp. rate 18, weight 58.06 kg (128 lb), SpO2 98 %. Dg Chest 1 View  08/12/2015  CLINICAL DATA:  Fever. EXAM: CHEST 1 VIEW COMPARISON:  08/09/2015 FINDINGS: There is a feeding tube which is looped within the stomach. Normal heart size. No pleural effusion or edema identified. No airspace consolidation noted. IMPRESSION: 1. No evidence for pneumonia Electronically Signed   By: Kerby Moors M.D.   On: 08/12/2015 11:43    Recent Labs  08/12/15 0742  WBC 11.9*  HGB 11.5*  HCT 33.5*  PLT 347    Recent Labs  08/12/15 0742 08/14/15 0545  NA 138 140  K 3.1* 3.0*  CL 109 105  GLUCOSE 130* 135*  BUN 11 8  CREATININE 0.62 0.55  CALCIUM 8.6* 8.7*   CBG (last 3)   Recent Labs  08/14/15 0112 08/14/15 0446 08/14/15 0727  GLUCAP 146* 139* 131*    Wt Readings from Last 3 Encounters:  08/14/15 58.06 kg (128 lb)  08/05/15 64.5 kg (142 lb 3.2 oz)  06/14/11 64.501 kg (142 lb 3.2 oz)    Physical Exam:  Constitutional: She appears well-developed, well nourished. NAD. HENT:  Head: Normocephalic. Atraumatic.  Eyes:  Pupils reactive to light  Neck: Normal range of motion. Neck supple. No thyromegaly present.  Cardiovascular: Normal rate and regular rhythm. + systolic Murmur Respiratory: Effort normal and breath sounds normal. No respiratory distress. no wheezes or rales GI: Soft. Bowel sounds are normal. She exhibits no distension.  Neurological:  Arouses to verbal/tactile stim. A little more subdued today. DTRs 3+ right upper and right lower extremity Unable to assess sensation. Motor:  Right upper extremity and right lower extremity appear to be  0/5 Left upper and left lower extremity potentially 3/5 or more (patient not participating in MMT) + Babinski bilaterally. + Clonus right lower extremity, extensor tone.  Flexor tone RUE Skin: Skin is warm and dry.   Assessment/Plan: 1. 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 Physiatrist is providing close team supervision and 24 hour management of active medical problems listed below. Physiatrist and rehab team continue to assess barriers to discharge/monitor patient progress toward functional and medical goals.  Function:  Bathing Bathing position   Position: Bed  Bathing parts Body parts bathed by patient: Chest, Abdomen Body parts bathed by helper: Right arm, Left arm, Chest, Abdomen, Front perineal area, Buttocks, Right upper leg, Left upper leg, Right lower leg, Left lower leg, Back  Bathing assist Assist Level: Touching or steadying assistance(Pt > 75%), 2 helpers      Upper Body Dressing/Undressing Upper body dressing   What is the patient wearing?: Hospital gown                Upper body assist Assist Level: 2 helpers      Lower Body Dressing/Undressing Lower body dressing Lower body dressing/undressing activity did not occur: N/A What is the patient wearing?: Hospital Gown, Ted [place], Non-skid slipper socks           Non-skid slipper socks- Performed by helper: Don/doff right sock, Don/doff left sock  TED Hose - Performed by helper: Don/doff right TED hose, Don/doff left TED hose  Lower body assist        Toileting Toileting Toileting activity did not occur: N/A        Toileting assist Assist level: More than reasonable time, Two helpers   Transfers Chair/bed transfer Chair/bed transfer activity did not occur: Safety/medical concerns Chair/bed transfer method: Squat pivot Chair/bed transfer  assist level: 2 helpers Chair/bed transfer assistive device: Armrests     Locomotion Ambulation Ambulation activity did not occur: Safety/medical concerns         Wheelchair   Type: Manual (TIS wheelchair)   Assist Level: Dependent (Pt equals 0%)  Cognition Comprehension Comprehension assist level: Understands basic less than 25% of the time/ requires cueing >75% of the time  Expression Expression assist level: Expresses basis less than 25% of the time/requires cueing >75% of the time.  Social Interaction Social Interaction assist level: Interacts appropriately less than 25% of the time. May be withdrawn or combative.  Problem Solving Problem solving assist level: Solves basic less than 25% of the time - needs direction nearly all the time or does not effectively solve problems and may need a restraint for safety  Memory Memory assist level: Recognizes or recalls less than 25% of the time/requires cueing greater than 75% of the time   Medical Problem List and Plan: 1. 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 DVT Prophylaxis/Anticoagulation: Subcutaneous heparin initiated 08/09/2015 3. Pain Management: Hydrocodone as needed 4. Dysphagia. Dysphagia #2 honey thick liquids.  -continue IV fluids for hydration.   -NGT inserted  -left wrist restraint required to keep NGT in place 5. Neuropsych: This patient is not capable of making decisions on her own behalf. 6. Skin/Wound Care: Routine skin checks 7. Decreased nutritional storage. Dietary follow-up. Check calorie counts  -NGT placed 8. Fluids/Electrolytes/Nutrition: following lytes, supplementing PO with NGT until more alert  -continue replete potassium  -  9. Seizure prophylaxis. Keppra 500 mg twice a day 10. Leukocytosis. Follow-up urine study/follow-up CBC 11. Hypertension. Cozaar 100 mg daily.  Monitor with increased mobility 12. Hyperlipidemia. Lipitor 13. Lethargy post stroke:   -will initiate ritalin trial today. Spoke to husband regarding potential use yesterday 14. Low grade temp resolved  -ua unremarkable. cx not performed---given defervescence will hold on cx  -CXR clear 15. Spasticity: PRAFO RLE  -continue ROM/modalities  -would like to avoid antispasmodics due to arousal level 16. Loose stool: likely from TF formula  -rate temporarily reduced. Have asked RD in assistance with more absorbable formula  -continue probiotic. Add fiber   LOS (Days) 3 A FACE TO FACE EVALUATION WAS PERFORMED  Adara Kittle T 08/14/2015 8:39 AM

## 2015-08-14 NOTE — Progress Notes (Signed)
Day 2 of 2: Calorie Count Note  48 hour calorie count ordered.  Diet: Dysphagia 1 diet with honey thick liquids  Breakfast: 0% intake Lunch: 0% intake Dinner: 0% intake Supplements: none  Day 2 Total intake: 0 kcal (0% of minimum estimated needs)  0 gram of protein (0% of minimum estimated needs)  Estimated Nutritional Needs:  Kcal: 1650-1850 Protein: 75-90 grams Fluid: 1.6 - 1.8 L/day  Pt with no po intake today. Pt has been not alert enough to eat at meals. Recommend continuation of tube feeding to provide adequate nutrition. Pt has been having multiple loose stools possibly from the tube feeding formula. During time of visit, TF was on hold. Noted TF was decreased to 35 ml/hr as pt with multiple loose stools. RD to switch formula to Vital 1.5 which is a hydrolyzed product which helps to promote tolerance. RD to continue to monitor.   Nutrition Dx: Malnutrition related to acute illness as evidenced by energy intake < or equal to 50% for > or equal to 5 days, moderate depletions of muscle mass; ongoing  Goal:  Pt to meet >/= 90% of their estimated nutrition needs (only met with TF)  Intervention:   Continue TF as pt with poor po/lethargic: Initiate Vital 1.5 formula via [place] NGT at 25 and increase by 10 ml every 4 hours to goal rate of 55 ml/hr x 20 hours (may hold for up to 4 hours for therapy) to provide 1650 kcal (100% of needs), 75 grams of protein, and 836 ml of free water.  Once IV fluids are discontinued, recommend free water flushes of 200 ml TID.   Discontinue calorie count.  Corrin Parker, MS, RD, LDN Pager # [phone] After hours/ weekend pager # [phone]

## 2015-08-14 NOTE — Progress Notes (Addendum)
STROKE TEAM PROGRESS NOTE   SUBJECTIVE (INTERVAL HISTORY) [name] and [name] were at the bedside. They reported that yesterday morning she still worked with PT for a while but today she was again just more somnolence. Repeat CT reported more midline shift, however, when we really compare with CT today and CT on 08/07/25, no significant change of midline shift and hematoma in the process to be absorbed. I had long discussion with [name] and [name] at bedside and answered all their questions.   OBJECTIVE Temp:  [98 F (36.7 C)-98.8 F (37.1 C)] 98.8 F (37.1 C) (04/27 1510) Pulse Rate:  [78-80] 78 (04/27 1510) Cardiac Rhythm:  [-]  Resp:  [18] 18 (04/27 1510) BP: (132-154)/(61-76) 144/61 mmHg (04/27 1510) SpO2:  [98 %-100 %] 99 % (04/27 1510) Weight:  [128 lb (58.06 kg)] 128 lb (58.06 kg) (04/27 0451)  CBC:   Recent Labs Lab 08/09/15 0229  08/11/15 0354 08/12/15 0742  WBC 13.0*  < > 13.9* 11.9*  NEUTROABS 9.7*  --   --  8.5*  HGB 12.4  < > 12.1 11.5*  HCT 36.7  < > 35.3* 33.5*  MCV 89.5  < > 88.0 86.8  PLT 299  < > 298 347  < > = values in this interval not displayed.  Basic Metabolic Panel:   Recent Labs Lab 08/11/15 0354 08/12/15 0742 08/14/15 0545  NA 137 138 140  K 3.6 3.1* 3.0*  CL 105 109 105  CO2 16* 20* 24  GLUCOSE 80 130* 135*  BUN 12 11 8   CREATININE 0.72 0.62 0.55  CALCIUM 8.8* 8.6* 8.7*  MG 1.8  --   --     Lipid Panel: No results found for: CHOL, TRIG, HDL, CHOLHDL, VLDL, LDLCALC HgbA1c: No results found for: HGBA1C Urine Drug Screen: No results found for: LABOPIA, COCAINSCRNUR, LABBENZ, AMPHETMU, THCU, LABBARB    IMAGING I have personally reviewed the radiological images below and agree with the radiology interpretations. Blue text is my interpretation  Ct Head Wo Contrast  08/01/2015   1. Left basal ganglia hemorrhagic infarct.  2. Mild chronic small vessel white matter ischemic changes in both cerebral hemispheres.  3. Chronic bilateral  frontal, bilateral ethmoid and left maxillary sinusitis.  4. Acute right maxillary sinusitis.   08/03/2015 1. Increasing size of left basal ganglia hemorrhage since last CT (but same as on MRI), now measuring 5.1 x 3.5 x 4.5 cm. 2. Increasing surrounding vasogenic edema and mass effect with progressive effacement of the left lateral ventricle and midline shift now measuring 3.5 mm. 3. Diffuse sinus disease.  08/08/2015 1. Left hemisphere hyperdense intra-axial hemorrhage is stable to slightly increased since 08/06/2015. Estimated hemorrhage volume 35 mL. 2. Surrounding edema and regional mass effect are stable, including mild rightward midline shift of 4 mm. 3. No new intracranial abnormality.  08/14/2015  IMPRESSION: 4.1 x 3.2 cm intraparenchymal hemorrhage centered in the left basal ganglia (estimated hemorrhage volume 26 mL), decreased. Surrounding vasogenic edema with 7 mm rightward midline shift, previously 4 mm. Basilar cisterns remain patent. However, by my interpretation, there is no significant change of midline shift comparing with CT on 08/08/15 and the hematoma is in the process to be absorbed.  Chest Port 1 View 08/01/2015   1. No acute findings.  2. Accentuation of the mediastinal contours may be due to AP technique and low lung volumes.   08/09/2015 No active disease.  MRA head without contrast 08/03/2015 1. No vascular abnormality or aneurysm underlying  the left basal ganglia hemorrhage identified. 2. No large or proximal arterial branch occlusion. Short-segment stenoses at the proximal left M1 and mid right M1 segments. 3. 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  MRI with and without contrast  08/02/2015 1. Increased size of acute intraparenchymal hemorrhage centered at the left basal ganglia, now measuring 5.6 x 3.3 x 3.6 cm  (estimated volume 33 mL). Associated localized vasogenic edema and mass effect with up to 6 mm of left-to-right shift. Intraventricular extension with small volume blood layering in the occipital horns of both lateral ventricles. No hydrocephalus or evidence of ventricular trapping. 2. No other acute intracranial abnormality. 3. Mild chronic small vessel ischemic disease.  EEG  08/06/15 focal left sided slowing but no seizures  08/09/15 Abnormal awake and drowsy routine inpatient EEG suggestive of underlying neuronal dysfunction in the left frontotemporal region, with epileptogenic potential as described. No evidence of electrographic seizures were seen during this recording.  TTE -  - Left ventricle: The cavity size was normal. Wall thickness was  increased in a pattern of mild LVH. Systolic function was normal.  The estimated ejection fraction was in the range of 60% to 65%.  Wall motion was normal; there were no regional wall motion  abnormalities. Doppler parameters are consistent with abnormal  left ventricular relaxation (grade 1 diastolic dysfunction). - Aortic valve: There was mild stenosis. Valve area (VTI): 1.1  cm^2. Valve area (Vmax): 1.15 cm^2. Valve area (Vmean): 1.15  cm^2. - Mitral valve: Mildly calcified annulus. There was mild  regurgitation.   PHYSICAL EXAM Neurologic Examination: Mental Status: Lethargic, sleepy but arousable. Able to maintain eyes open for a short period of time. Non verbal and she follows no commands. Cranial Nerves: Does not blink to threat on the right but does so on the left. III/IV/VI-Pupils were equal and reacted normally to light. Extraocular movements were full and conjugate but slight left gaze preference.  VII-right lower facial weakness facial weakness. VIII-normal. X-no speech output. Motor: Right hemiplegia with 0/5 strength RUE and 2/5 RLE on pain stimulation. Purposeful movements on the left side. No posturing. Sensory:  Withdraws right extremities to painful stimulation. Deep Tendon Reflexes: 2+, asymmetric with increased responses elicited from right extremities. Plantars: Extensor on the right and flexor on the left Gait - not tested   ASSESSMENT/PLAN [name] is a 75 y.o. female with history hypertension and hyperlipidemia presenting with right hemiparesis and speech difficulties.  She did not receive IV t-PA due to large left basal ganglia hemorrhage.  Hemorrhagic Stroke:  Dominant L basal ganglia hemorrhage with cytotoxic cerebral edema and midline shift, hemorrhage probably secondary to hypertension.  Resultant  Drowsy with dense right hemiplegia, aphasia and dysphagia  MRI acute left BG ICH   MRA  No vascular abnormality or aneurysm underlying the left basal ganglia hemorrhage identified.  Repeat CT today no significant change of midline shift comparing with CT head on 08/08/15. Hematoma size decreasing.  2D Echo - EF 60-65%, mild AS - performed January 2017  VTE prophylaxis - heparin subq  DIET - DYS 1 Room service appropriate?: Yes; Fluid consistency:: Honey Thick.   No antithrombotic prior to admission, now on No antithrombotic secondary to hemorrhage.  Ongoing aggressive stroke risk factor management  Fluctuating mental status - likely sleep wake cycle disturbance due to thalamus/hypothalamus involvement ([facility]) - will rule out seizure and  keppra or lipitor side effect  EEG - 08/06/15 focal left sided slowing but no seizures  EEG - 08/09/15 Abnormal awake and drowsy routine inpatient EEG suggestive of underlying neuronal dysfunction in the left frontotemporal region, with epileptogenic potential as described. No evidence of electrographic seizures were seen during this recording. Clinical correlation is recommended.   Due to abnormal EEG, started keppra 500mg  bid. However, will d/c keppra to rule out side effect of somnolence.   Repeat EEG to rule out seizure in  am  UA clear 08/11/15  WBC 9.8-> 13.0->11.9  Afebrile   CXR no infection or pneumonia  Encourage increase daily activity, correct sleep wake cycle disturbance   I had long discussion with [name] and [name] at beside. Answered all their questions to their satisfaction.  Rosalin Hawking, MD PhD Stroke Neurology 08/14/2015 4:05 PM   To contact Stroke Continuity provider, please refer to [email or web address] After hours, contact General Neurology

## 2015-08-14 NOTE — Progress Notes (Signed)
At [place] patient would not open eyes doing assessment, however would grimace with the movement of her neck to reposition. Patient was unable to follow commands. PT assisted patient to wheelchair. Husband arrived at bedside,  stating "that patient had been sleeping for 16 hours, and we need to do something." Reassured him that Naaman Plummer, MD had seen patient this morning. Encouraged husband to voice his concerns, and I would notify Linna Hoff PA to discuss.   Made Dan PA aware of patient's husband concerns. Naaman Plummer, MD came to patient's room and discuss with patient's husband his concerns.[place]

## 2015-08-14 NOTE — IPOC Note (Signed)
Overall Plan of Care [facility]) Patient Details Name: [name] MRN: [id number] DOB: 24-Jul-1940  Admitting Diagnosis: lt bg hemm  Hospital Problems: Active Problems:   Left Basal ganglia hemorrhage (HCC)   Hemiplegia affecting dominant side (HCC)   Aphasia, post-stroke   Dysphagia, post-stroke   Dysphasia, post-stroke   Hemiplegia, post-stroke (HCC)   Cognitive deficit, post-stroke   Seizure prophylaxis   Leukocytosis   Benign essential HTN   HLD (hyperlipidemia)   Lethargy     Functional Problem List: Nursing Bladder, Bowel, Endurance, Medication Management, Motor, Nutrition, Safety, Skin Integrity, Sensory, Behavior  PT Balance, Behavior, Edema, Endurance, Motor, Nutrition, Pain, Perception, Safety, Sensory  OT Balance, Cognition, Endurance, Motor, Safety, Perception, Sensory, Vision  SLP Cognition  TR         Basic ADL's: OT Eating, Grooming, Bathing, Dressing, Toileting     Advanced  ADL's: OT       Transfers: PT Bed Mobility, Bed to Chair, Car, Chief Operating Officer: PT Ambulation, Emergency planning/management officer, Stairs     Additional Impairments: OT None, Fuctional Use of Upper Extremity  SLP Swallowing comprehension, expression Social Interaction, Problem Solving, Memory, Attention, Awareness  TR      Anticipated Outcomes Item Anticipated Outcome  Self Feeding n/a - tube feeding  Swallowing  Min A with least restrictive diet    Basic self-care  mod A  Toileting  mod A   Bathroom Transfers mod A  Bowel/Bladder  Min assist  Transfers  mod A  Locomotion  mod A wheelchair level  Communication  Mod A  Cognition  Min A   Pain  < 4  Safety/Judgment  Min assist   Therapy Plan: PT Intensity: Minimum of 1-2 x/day ,45 to 90 minutes PT Frequency: 5 out of 7 days PT Duration Estimated Length of Stay: 25-28 days OT Intensity: Minimum of 1-2 x/day, 45 to 90 minutes OT Frequency: 5 out of 7 days OT Duration/Estimated Length of Stay:  25-28 days SLP Intensity: Minumum of 1-2 x/day, 30 to 90 minutes SLP Frequency: 3 to 5 out of 7 days SLP Duration/Estimated Length of Stay: 3-4 weeks        Team Interventions: Nursing Interventions Patient/Family Education, Bladder Management, Bowel Management, Disease Management/Prevention, Cognitive Remediation/Compensation, Skin Care/Wound Management, Discharge Planning, Medication Management, Dysphagia/Aspiration Precaution Training, Psychosocial Support  PT interventions Ambulation/gait training, Training and development officer, Cognitive remediation/compensation, Community reintegration, Discharge planning, Disease management/prevention, DME/adaptive equipment instruction, Functional electrical stimulation, Functional mobility training, Neuromuscular re-education, Pain management, Patient/family education, Psychosocial support, Splinting/orthotics, Stair training, Therapeutic Activities, Therapeutic Exercise, UE/LE Strength taining/ROM, UE/LE Coordination activities, Visual/perceptual remediation/compensation, Wheelchair propulsion/positioning  OT Interventions Balance/vestibular training, Functional electrical stimulation, Self Care/advanced ADL retraining, UE/LE Coordination activities, Cognitive remediation/compensation, Functional mobility training, Skin care/wound managment, Community reintegration, Neuromuscular re-education, Wheelchair propulsion/positioning, Discharge planning, Pain management, Therapeutic Activities, Patient/family education, Therapeutic Exercise, DME/adaptive equipment instruction, Psychosocial support, UE/LE Strength taining/ROM  SLP Interventions Cueing hierarchy, Functional tasks, Patient/family education, Environmental controls, Internal/external aids, Dysphagia/aspiration precaution training, Therapeutic Activities  TR Interventions    SW/CM Interventions Discharge Planning, Psychosocial Support, Patient/Family Education    Team Discharge Planning: Destination:  PT-Home ,OT- [place] (SNF) , SLP-Home Projected Follow-up: PT-Home health PT, 24 hour supervision/assistance, OT-  Skilled nursing facility, SLP-Home Health SLP, 24 hour supervision/assistance Projected Equipment Needs: PT-To be determined, OT- To be determined, SLP-To be determined Equipment Details: PT- , OT-  Patient/family involved in discharge planning: PT- Patient unable/family or caregiver not available,  OT-Patient unable/family  or caregiver not available, SLP-Family member/caregiver  MD ELOS: 20-24d Medical Rehab Prognosis:  Good Assessment: 75 y.o. right handed female with history of hypertension. Presented 08/01/2015 with acute onset of aphasia and right-sided weakness. Patient lives with spouse independent prior to admission. Husband is a retired Field se[employer]ologist. Blood pressure 120/106. CT of the head and imaging revealed large left basal ganglia hemorrhage with no extension into the left lateral ventricle. MRI of the brain showing increased size of acute intraparenchymal hemorrhage centered at the left basal ganglion measuring 5.6 x 3.3 x 3.6 cm. Associated localized vasogenic edema and mass effect. No hydrocephalus. MRA of the head with no vascular abnormality or aneurysm underlying the left basal ganglia hemorrhage. No larger proximal arterial branch occlusion. Neurology follow-up conservative care and close monitoring of blood pressure. Follow-up CT of the head 08/08/2015 due to increased somnolence that showed left hemisphere hyperdense intra-axial hemorrhage stable to slightly increased from 08/06/2015. Surrounding edema and regional mass effect stable, including mild rightward midline shift of 4 mm. No new intracranial abnormality. EEG 08/09/2015 showed Mild focal slowing in the left temporal region with rare intermittent left frontotemporal abnormal epileptiform discharges in the form of sharps with phase reversals noted. She was placed on Keppra 500 mg twice a day  08/09/2015. Currently on a dysphagia #2 honey thick liquid diet after modified barium swallow  Now requiring 24/7 Rehab RN,MD, as well as CIR level PT, OT and SLP.  Treatment team will focus on ADLs and mobility with goals set at Mod A    See Team Conference Notes for weekly updates to the plan of care

## 2015-08-14 NOTE — Progress Notes (Signed)
Speech Language Pathology Daily Session Note  Patient Details  Name: [name] MRN: [id number] Date of Birth: 1940-12-01  Today's Date: 08/14/2015 SLP Individual Time: 1300-1320 SLP Individual Time Calculation (min): 20 min and Today's Date: 08/14/2015 SLP Missed Time: 40 Minutes Missed Time Reason: Patient fatigue  Short Term Goals: Week 1: SLP Short Term Goal 1 (Week 1): Patient will maintain arousal in regards to keeping her eyes open for ~60 seconds with Max A multimodal cues.  SLP Short Term Goal 2 (Week 1): Patient will follow basic 1 step commands in 50% of opporunities with Max A multimodal cues.  SLP Short Term Goal 3 (Week 1): Patient will answer basic yes/no questions utilizing multimodal communication with 50% accuracy with Max A multimodal cues.  SLP Short Term Goal 4 (Week 1): Patient will consume current diet without overt s/s of aspiration with Mod A verbal cues for use of swallowing compensatory strategies.   Skilled Therapeutic Interventions: Skilled treatment session focused on cognitive goals. Upon arrival, patient was asleep while supine in bed. Patient did not open her eyes or respond to the clincian despite Max A multimodal cues. SLP attempted several interventions such as auditory stimulation and noxious stimuli without success. RN made aware and all vitals were [facility]. Patient missed remaining 40 minutes of SLP session due to decreased arousal. Patient left supine in bed with wrist restraint and mitt on LUE and alarm on. Continue with current plan of care.    Function:    Cognition Comprehension Comprehension assist level: Understands basic less than 25% of the time/ requires cueing >75% of the time  Expression   Expression assist level: Expresses basis less than 25% of the time/requires cueing >75% of the time.  Social Interaction Social Interaction assist level: Interacts appropriately less than 25% of the time. May be withdrawn or combative.  Problem Solving  Problem solving assist level: Solves basic less than 25% of the time - needs direction nearly all the time or does not effectively solve problems and may need a restraint for safety  Memory Memory assist level: Recognizes or recalls less than 25% of the time/requires cueing greater than 75% of the time    Pain No s/s of pain   Therapy/Group: Individual Therapy  Besan Ketchem 08/14/2015, 3:10 PM

## 2015-08-14 NOTE — Progress Notes (Signed)
Speech Language Pathology Daily Session Note  Patient Details  Name: [name] MRN: [id number] Date of Birth: Feb 10, 1941  Today's Date: 08/14/2015 SLP Individual Time: 1515-1530 SLP Individual Time Calculation (min): 15 min  Short Term Goals: Week 1: SLP Short Term Goal 1 (Week 1): Patient will maintain arousal in regards to keeping her eyes open for ~60 seconds with Max A multimodal cues.  SLP Short Term Goal 2 (Week 1): Patient will follow basic 1 step commands in 50% of opporunities with Max A multimodal cues.  SLP Short Term Goal 3 (Week 1): Patient will answer basic yes/no questions utilizing multimodal communication with 50% accuracy with Max A multimodal cues.  SLP Short Term Goal 4 (Week 1): Patient will consume current diet without overt s/s of aspiration with Mod A verbal cues for use of swallowing compensatory strategies.   Skilled Therapeutic Interventions:  Pt was seen for skilled ST targeting cognitive-linguistic goals.  Pt somnolent upon arrival.  Eyes remained closed despite sternal rub, cold compress, loud voice, and environmental modifications.  Pt did not respond to painful stimuli nor did her alertness improve while SLP completed oral care.  Discussed current goals and rationale behind ST interventions with pt's family.  Pt was left in bed with call bell within reach and family at bedside.  Continue per current plan of care.    Function:  Eating Eating         Eating Set Up Assist For: Parenteral or tube feed supplies       Cognition Comprehension Comprehension assist level: Understands basic less than 25% of the time/ requires cueing >75% of the time  Expression   Expression assist level: Expresses basis less than 25% of the time/requires cueing >75% of the time.  Social Interaction Social Interaction assist level: Interacts appropriately less than 25% of the time. May be withdrawn or combative.  Problem Solving Problem solving assist level: Solves basic  less than 25% of the time - needs direction nearly all the time or does not effectively solve problems and may need a restraint for safety  Memory Memory assist level: Recognizes or recalls less than 25% of the time/requires cueing greater than 75% of the time    Pain Pain Assessment Pain Assessment: Faces Faces Pain Scale: No hurt  Therapy/Group: Individual Therapy  Almer Bushey, Selinda Orion 08/14/2015, 4:45 PM

## 2015-08-15 ENCOUNTER — Inpatient Hospital Stay (HOSPITAL_COMMUNITY): Payer: Medicare Other | Admitting: Physical Therapy

## 2015-08-15 ENCOUNTER — Inpatient Hospital Stay (HOSPITAL_COMMUNITY): Payer: Medicare Other

## 2015-08-15 ENCOUNTER — Inpatient Hospital Stay (HOSPITAL_COMMUNITY): Payer: Medicare Other | Admitting: Speech Pathology

## 2015-08-15 ENCOUNTER — Inpatient Hospital Stay (HOSPITAL_COMMUNITY): Payer: Medicare Other | Admitting: Occupational Therapy

## 2015-08-15 LAB — GLUCOSE, CAPILLARY
GLUCOSE-CAPILLARY: 104 mg/dL — AB (ref 65–99)
GLUCOSE-CAPILLARY: 88 mg/dL (ref 65–99)
GLUCOSE-CAPILLARY: 93 mg/dL (ref 65–99)
Glucose-Capillary: 112 mg/dL — ABNORMAL HIGH (ref 65–99)
Glucose-Capillary: 137 mg/dL — ABNORMAL HIGH (ref 65–99)
Glucose-Capillary: 97 mg/dL (ref 65–99)

## 2015-08-15 LAB — BASIC METABOLIC PANEL
ANION GAP: 12 (ref 5–15)
BUN: 8 mg/dL (ref 6–20)
CHLORIDE: 106 mmol/L (ref 101–111)
CO2: 22 mmol/L (ref 22–32)
CREATININE: 0.54 mg/dL (ref 0.44–1.00)
Calcium: 8.8 mg/dL — ABNORMAL LOW (ref 8.9–10.3)
GFR calc non Af Amer: >60 mL/min (ref >60)
GLUCOSE: 138 mg/dL — AB (ref 65–99)
Potassium: 3.6 mmol/L (ref 3.5–5.1)
Sodium: 140 mmol/L (ref 135–145)

## 2015-08-15 LAB — CBC
HEMATOCRIT: 32.1 % — AB (ref 36.0–46.0)
HEMOGLOBIN: 10.7 g/dL — AB (ref 12.0–15.0)
MCH: 30.1 pg (ref 26.0–34.0)
MCHC: 33.3 g/dL (ref 30.0–36.0)
MCV: 90.2 fL (ref 78.0–100.0)
Platelets: 365 10*3/uL (ref 150–400)
RBC: 3.56 MIL/uL — ABNORMAL LOW (ref 3.87–5.11)
RDW: 14.2 % (ref 11.5–15.5)
WBC: 9.1 10*3/uL (ref 4.0–10.5)

## 2015-08-15 MED ORDER — SODIUM CHLORIDE 0.45 % IV SOLN
INTRAVENOUS | Status: DC
  Administered 2015-08-15: 19:00:00 via INTRAVENOUS

## 2015-08-15 NOTE — Procedures (Signed)
History: 75 year old female with a history of basal ganglia hemorrhage  Sedation: None  Technique: This is a 21 channel routine scalp EEG performed at the bedside with bipolar and monopolar montages arranged in accordance to the international 10/20 system of electrode placement. One channel was dedicated to EKG recording.    Background: There is a posterior dominant rhythm of 9 Hz which is better seen on the right than left. There is focal slowing seen most prominent over the left  temporal regions, most prominent at F7, T7.  Photic stimulation: Physiologic driving is now performed  EEG Abnormalities: 1) left anterior temporal slow activity  Clinical Interpretation: This EEG is consistent with a focal cerebral dysfunction in the left temporal region. There was no seizure or seizure predisposition recorded on this study. Please note that a normal EEG does not preclude the possibility of epilepsy.   Roland Rack, MD [facility] [phone]  If 7pm- 7am, please page neurology on call as listed in [place].

## 2015-08-15 NOTE — Progress Notes (Signed)
STROKE TEAM PROGRESS NOTE   SUBJECTIVE (INTERVAL HISTORY) Dr. London Pepper is at the bedside. Pt much awake alert and sitting in chair for PT. Still has right hemiplegia but LE seems to start to have strength. Still aphasic but able to make minimal sounds.  OBJECTIVE Temp:  [98.7 F (37.1 C)-98.9 F (37.2 C)] 98.7 F (37.1 C) (04/28 1640) Pulse Rate:  [73-80] 73 (04/28 1640) Cardiac Rhythm:  [-]  Resp:  [16] 16 (04/28 0417) BP: (149-166)/(72-84) 166/84 mmHg (04/28 1640) SpO2:  [98 %] 98 % (04/28 1640) Weight:  [132 lb 7.9 oz (60.1 kg)] 132 lb 7.9 oz (60.1 kg) (04/28 0417)  CBC:   Recent Labs Lab 08/09/15 0229  08/12/15 0742 08/15/15 0722  WBC 13.0*  < > 11.9* 9.1  NEUTROABS 9.7*  --  8.5*  --   HGB 12.4  < > 11.5* 10.7*  HCT 36.7  < > 33.5* 32.1*  MCV 89.5  < > 86.8 90.2  PLT 299  < > 347 365  < > = values in this interval not displayed.  Basic Metabolic Panel:   Recent Labs Lab 08/11/15 0354  08/14/15 0545 08/15/15 0722  NA 137  < > 140 140  K 3.6  < > 3.0* 3.6  CL 105  < > 105 106  CO2 16*  < > 24 22  GLUCOSE 80  < > 135* 138*  BUN 12  < > 8 8  CREATININE 0.72  < > 0.55 0.54  CALCIUM 8.8*  < > 8.7* 8.8*  MG 1.8  --   --   --   < > = values in this interval not displayed.  Lipid Panel: No results found for: CHOL, TRIG, HDL, CHOLHDL, VLDL, LDLCALC HgbA1c: No results found for: HGBA1C Urine Drug Screen: No results found for: LABOPIA, COCAINSCRNUR, LABBENZ, AMPHETMU, THCU, LABBARB    IMAGING I have personally reviewed the radiological images below and agree with the radiology interpretations. Blue text is my interpretation  Ct Head Wo Contrast  08/01/2015   1. Left basal ganglia hemorrhagic infarct.  2. Mild chronic small vessel white matter ischemic changes in both cerebral hemispheres.  3. Chronic bilateral frontal, bilateral ethmoid and left maxillary sinusitis.  4. Acute right maxillary sinusitis.   08/03/2015 1. Increasing size of left basal ganglia  hemorrhage since last CT (but same as on MRI), now measuring 5.1 x 3.5 x 4.5 cm. 2. Increasing surrounding vasogenic edema and mass effect with progressive effacement of the left lateral ventricle and midline shift now measuring 3.5 mm. 3. Diffuse sinus disease.  08/08/2015 1. Left hemisphere hyperdense intra-axial hemorrhage is stable to slightly increased since 08/06/2015. Estimated hemorrhage volume 35 mL. 2. Surrounding edema and regional mass effect are stable, including mild rightward midline shift of 4 mm. 3. No new intracranial abnormality.  08/14/2015  IMPRESSION: 4.1 x 3.2 cm intraparenchymal hemorrhage centered in the left basal ganglia (estimated hemorrhage volume 26 mL), decreased. Surrounding vasogenic edema with 7 mm rightward midline shift, previously 4 mm. Basilar cisterns remain patent. However, by my interpretation, there is no significant change of midline shift comparing with CT on 08/08/15 and the hematoma is in the process to be absorbed.  Chest Port 1 View 08/01/2015   1. No acute findings.  2. Accentuation of the mediastinal contours may be due to AP technique and low lung volumes.   08/09/2015 No active disease.  MRA head without contrast 08/03/2015 1. No vascular abnormality or aneurysm underlying the left basal  ganglia hemorrhage identified. 2. No large or proximal arterial branch occlusion. Short-segment stenoses at the proximal left M1 and mid right M1 segments. 3. 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  MRI with and without contrast  08/02/2015 1. Increased size of acute intraparenchymal hemorrhage centered at the left basal ganglia, now measuring 5.6 x 3.3 x 3.6 cm (estimated volume 33 mL). Associated localized vasogenic edema and mass effect with up to 6 mm of left-to-right shift. Intraventricular extension with  small volume blood layering in the occipital horns of both lateral ventricles. No hydrocephalus or evidence of ventricular trapping. 2. No other acute intracranial abnormality. 3. Mild chronic small vessel ischemic disease.  EEG  08/06/15 focal left sided slowing but no seizures  08/09/15 Abnormal awake and drowsy routine inpatient EEG suggestive of underlying neuronal dysfunction in the left frontotemporal region, with epileptogenic potential as described. No evidence of electrographic seizures were seen during this recording.  EEG repeat 08/15/15 - pending  TTE -  - Left ventricle: The cavity size was normal. Wall thickness was  increased in a pattern of mild LVH. Systolic function was normal.  The estimated ejection fraction was in the range of 60% to 65%.  Wall motion was normal; there were no regional wall motion  abnormalities. Doppler parameters are consistent with abnormal  left ventricular relaxation (grade 1 diastolic dysfunction). - Aortic valve: There was mild stenosis. Valve area (VTI): 1.1  cm^2. Valve area (Vmax): 1.15 cm^2. Valve area (Vmean): 1.15  cm^2. - Mitral valve: Mildly calcified annulus. There was mild  regurgitation.   PHYSICAL EXAM  Temp:  [98.7 F (37.1 C)-98.9 F (37.2 C)] 98.7 F (37.1 C) (04/28 1640) Pulse Rate:  [73-80] 73 (04/28 1640) Resp:  [16] 16 (04/28 0417) BP: (149-166)/(72-84) 166/84 mmHg (04/28 1640) SpO2:  [98 %] 98 % (04/28 1640) Weight:  [132 lb 7.9 oz (60.1 kg)] 132 lb 7.9 oz (60.1 kg) (04/28 0417)  General - Well nourished, well developed, in no apparent distress.  Ophthalmologic - Fundi not visualized due to noncooperation.  Cardiovascular - Regular rate and rhythm with no murmur.  Neuro - awake alert and tracking to objects. Still not making language outpt but able to make minimal sound and lip language. Follow some simple commands but not all of them. PERRL, blinking to visual threat bilaterally, right facial droop,  tongue in midline. LUE 0/5 and LLE no spontaneous movement but on pain stimulation left knee extension 2+/5. LUE increased tone but flaccid LLE. RUE and RLE 5/5. DTR 1+. Sensation, coordination and gait not tested.   ASSESSMENT/PLAN [name] is a 75 y.o. female with history hypertension and hyperlipidemia presenting with right hemiparesis and speech difficulties.  She did not receive IV t-PA due to large left basal ganglia hemorrhage.  Hemorrhagic Stroke:  Dominant L basal ganglia hemorrhage with cytotoxic cerebral edema and midline shift, hemorrhage probably secondary to hypertension.  Resultant  Drowsy with dense right hemiplegia, aphasia and dysphagia  MRI acute left BG ICH   MRA  No vascular abnormality or aneurysm underlying the left basal ganglia hemorrhage identified.  Repeat CT today no significant change of midline shift comparing with CT head on 08/08/15. Hematoma size decreasing.  2D Echo - EF 60-65%, mild AS - performed January 2017  VTE prophylaxis - heparin subq  DIET - DYS 1 Room service appropriate?: Yes; Fluid consistency:: Honey Thick.  No antithrombotic prior to admission, now on No antithrombotic secondary to hemorrhage.  Ongoing aggressive stroke risk factor management  Fluctuating mental status - likely sleep wake cycle disturbance due to thalamus/hypothalamus involvement ([facility]) - d/c keppra or lipitor to avoid any side effects  EEG - 08/06/15 focal left sided slowing but no seizures  EEG - 08/09/15 Abnormal awake and drowsy routine inpatient EEG suggestive of underlying neuronal dysfunction in the left frontotemporal region, with epileptogenic potential as described. No evidence of electrographic seizures were seen during this recording. Clinical correlation is recommended.   Due to abnormal EEG, started keppra 500mg  bid. However, keppra discontinued to rule out side effect of somnolence.   Repeat EEG pending  UA clear 08/11/15  WBC  9.8-> 13.0->11.9  Afebrile   CXR no infection or pneumonia  Encourage increase daily activity, correct sleep wake cycle disturbance   Rosalin Hawking, MD PhD Stroke Neurology 08/15/2015 6:31 PM   To contact Stroke Continuity provider, please refer to [email or web address] After hours, contact General Neurology

## 2015-08-15 NOTE — Progress Notes (Signed)
EEG completed, results pending. 

## 2015-08-15 NOTE — Progress Notes (Signed)
[facility] PHYSICAL MEDICINE & REHABILITATION     PROGRESS NOTE    Subjective/Complaints: More alert yesterday afternoon and this morning. Had a good night per RN. No stools since yesterday at 1500  ROS limited due to cognitive status  Objective: Vital Signs: Blood pressure 149/72, pulse 80, temperature 98.9 F (37.2 C), temperature source Oral, resp. rate 16, weight 60.1 kg (132 lb 7.9 oz), SpO2 98 %. Ct Head Wo Contrast  08/14/2015  CLINICAL DATA:  Intracranial hemorrhage, somnolent EXAM: CT HEAD WITHOUT CONTRAST TECHNIQUE: Contiguous axial images were obtained from the base of the skull through the vertex without intravenous contrast. COMPARISON:  08/08/2015 FINDINGS: Intraparenchymal hemorrhage in the left basal ganglia measures 4.1 x 3.2 cm (estimated hemorrhage volume 26 mL), previously 5.0 x 3.5 cm (estimated volume 35 mL). Surrounding vasogenic edema. 7 mm rightward midline shift (series 2/ image 14), previously 4 mm. Mild effacement of the left lateral ventricle. No intraventricular hemorrhage. Basal cisterns remain patent.  No downward herniation. No extra-axial fluid collection. No CT evidence of acute infarction. Subcortical white matter and periventricular small vessel ischemic changes. Intracranial atherosclerosis. Visualized paranasal sinuses are essentially clear. Partial opacification of the bilateral mastoid air cells, left greater than right. Mild global cortical atrophy.  No ventriculomegaly. No evidence of calvarial fracture. IMPRESSION: 4.1 x 3.2 cm intraparenchymal hemorrhage centered in the left basal ganglia (estimated hemorrhage volume 26 mL), decreased. Surrounding vasogenic edema with 7 mm rightward midline shift, previously 4 mm. Basilar cisterns remain patent. Electronically Signed   By: Julian Hy M.D.   On: 08/14/2015 11:23    Recent Labs  08/15/15 0722  WBC 9.1  HGB 10.7*  HCT 32.1*  PLT 365    Recent Labs  08/14/15 0545 08/15/15 0722  NA 140  140  K 3.0* 3.6  CL 105 106  GLUCOSE 135* 138*  BUN 8 8  CREATININE 0.55 0.54  CALCIUM 8.7* 8.8*   CBG (last 3)   Recent Labs  08/14/15 2328 08/15/15 0402 08/15/15 0757  GLUCAP 111* 112* 137*    Wt Readings from Last 3 Encounters:  08/15/15 60.1 kg (132 lb 7.9 oz)  08/05/15 64.5 kg (142 lb 3.2 oz)  06/14/11 64.501 kg (142 lb 3.2 oz)    Physical Exam:  Constitutional: She appears well-developed, well nourished. NAD. HENT:  Head: Normocephalic. Atraumatic.  Eyes:  Pupils reactive to light  Neck: Normal range of motion. Neck supple. No thyromegaly present.  Cardiovascular: Normal rate and regular rhythm. + systolic Murmur Respiratory: Effort normal and breath sounds normal. No respiratory distress. no wheezes or rales GI: Soft. Bowel sounds are normal. She exhibits no distension.  Neurological:  Alert. Makes good eye contact. Would protrude tongue when cued. However, perseverated on this and had difficulty following any other commands.  DTRs 3+ right upper and right lower extremity Unable to assess sensation. Motor:  Right upper extremity and right lower extremity appear to be 0/5 Left upper and left lower extremity potentially 3/5 or more (patient not participating in MMT) + Babinski bilaterally. + Clonus right lower extremity, extensor tone persists.  Flexor tone RUE Skin: Skin is warm and dry.   Assessment/Plan: 1. 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 Physiatrist is providing close team supervision and 24 hour management of active medical problems listed below. Physiatrist and rehab team continue to assess barriers to discharge/monitor patient progress toward functional  and medical goals.  Function:  Bathing Bathing position   Position: Bed  Bathing parts Body parts bathed by patient: Chest, Abdomen Body parts  bathed by helper: Right arm, Left arm, Chest, Abdomen, Front perineal area, Buttocks, Right upper leg, Left upper leg, Right lower leg, Left lower leg, Back  Bathing assist Assist Level: Touching or steadying assistance(Pt > 75%), 2 helpers      Upper Body Dressing/Undressing Upper body dressing   What is the patient wearing?: Hospital gown                Upper body assist Assist Level: 2 helpers      Lower Body Dressing/Undressing Lower body dressing Lower body dressing/undressing activity did not occur: N/A What is the patient wearing?: Hospital Gown, Ted Diaz, Non-skid slipper socks           Non-skid slipper socks- Performed by helper: Don/doff right sock, Don/doff left sock               TED Hose - Performed by helper: Don/doff right TED hose, Don/doff left TED hose  Lower body assist        Toileting Toileting Toileting activity did not occur: N/A        Toileting assist Assist level: More than reasonable time, Two helpers   Transfers Chair/bed transfer   Chair/bed transfer method: Squat pivot Chair/bed transfer assist level: 2 helpers Chair/bed transfer assistive device: Armrests     Locomotion Ambulation Ambulation activity did not occur: Safety/medical concerns         Wheelchair   Type: Manual (TIS wheelchair)   Assist Level: Dependent (Pt equals 0%)  Cognition Comprehension Comprehension assist level: Understands basic less than 25% of the time/ requires cueing >75% of the time  Expression Expression assist level: Expresses basis less than 25% of the time/requires cueing >75% of the time.  Social Interaction Social Interaction assist level: Interacts appropriately less than 25% of the time. May be withdrawn or combative.  Problem Solving Problem solving assist level: Solves basic less than 25% of the time - needs direction nearly all the time or does not effectively solve problems and may need a restraint for safety  Memory Memory assist  level: Recognizes or recalls less than 25% of the time/requires cueing greater than 75% of the time   Medical Problem List and Plan: 1. 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 DVT Prophylaxis/Anticoagulation: Subcutaneous heparin initiated 08/09/2015 3. Pain Management: Hydrocodone as needed 4. Dysphagia. Dysphagia #2 honey thick liquids.  -continue IV fluids for hydration--change to hs only to make therapy easier   -NGT in place  -left wrist restraint required to keep NGT in place 5. Neuropsych: This patient is not capable of making decisions on her own behalf. 6. Skin/Wound Care: Routine skin checks 7. Decreased nutritional storage. Dietary follow-up. Check calorie counts  -NGT placed 8. Fluids/Electrolytes/Nutrition: following lytes, supplementing PO with NGT until more alert  -continue replete potassium  -  9. Seizure prophylaxis. Keppra 500 mg twice a day (stopped yesterday due to ?source of somnolence) 10. Leukocytosis. Follow-up urine study/follow-up CBC 11. Hypertension. Cozaar 100 mg daily. Monitor with increased mobility 12. Hyperlipidemia. Lipitor 13. Lethargy post stroke: patient much more alert this morning and yesterday after ritalin  -continue ritalin  -keppra stopped by neurology,  -follow up EEG ordered for today  -appreciate neuro help 14. Low grade temp resolved  -  ua unremarkable. -given defervescence will hold on cx  -CXR clear 15. Spasticity: PRAFO RLE  -continue ROM/modalities  -would like to avoid antispasmodics due to arousal level 16. Loose stool: likely from TF formula  -formula changed  -continue probiotic. Added fiber   LOS (Days) 4 A FACE TO FACE EVALUATION WAS PERFORMED  SWARTZ,ZACHARY T 08/15/2015 8:54 AM

## 2015-08-15 NOTE — Progress Notes (Signed)
Physical Therapy Session Note  Patient Details  Name: [name] MRN: [id number] Date of Birth: Apr 02, 1941  Today's Date: 08/15/2015 PT Individual Time: 0915-1000 and 1550-1630 PT Individual Time Calculation (min): 45 min and 40 min  Short Term Goals: Week 1:  PT Short Term Goal 1 (Week 1): Patient will maintain arousal for 60 min consistently with min multimodal cues. PT Short Term Goal 2 (Week 1): Patient will follow basic 1 step commands in 75% of available opportunities.  PT Short Term Goal 3 (Week 1): Patient will maintain sitting balance x 5 min with supervision.  PT Short Term Goal 4 (Week 1): Patient will initiate ambulation.   Skilled Therapeutic Interventions/Progress Updates:   Treatment 1: Session focused on arousal, attention, sitting balance, bed mobility, and functional transfers. Patient asleep in bed upon arrival, requiring max multimodal cues to keep eyes open < 5 sec for first half of session then appeared more alert after transfer to wheelchair and back to bed at end of session. Performed PROM to RLE with no tone noted and RUE with moderate flexor tone noted. Patient only able to follow commands 75% of time to move LLE in bed and did not follow any other commands. Utilized cool wash cloth with LUE HOH assist and auditory stimulation to arouse patient. Patient transferred supine > sit with total A, performed static sitting balance edge of mat with max-total A and HOH assist to place/keep LUE on bed to L, and performed squat pivot transfers with total A x 2. Patient transported to gym to facilitate alertness and increase participation in session but patient had loose incontinent bowel movement and returned to room. Patient required total A x 2 for sit > supine and max A for rolling to R and L with HOH assist to place LUE on rail when rolling to R for total A hygiene and doffing/donning brief. With RLE in hip abduction for hygiene, patient noted to move RLE 2-3 times but most  likely not purposeful movement. Patient left supine in bed with eyes closed and L mitt donned with bed alarm on and RUE supported on pillows.   Treatment 2: Patient asleep in bed with son present for part of session. Patient required max multimodal cues for arousal but did not open eyes until transfer via [place] to tilt table and remained lethargic throughout session. Rolling R and L with max A with HOH assist for LUE to reach bed rail to place Maximove sling, total A transfer bed <> tilt table. Patient initiated approx 30 % of commands in semi upright position on tilt table but unable to complete any tasks. Patient tolerated 10 min on tilt table up to 60 degrees to facilitate alertness, BLE WB, postural control, and upright tolerance, see vitals assessed below. Patient left in bed with son present, nursing notified of urinary incontinence.   Tilt Table Vitals Time/Position/BP 1610 supine = 166/84, HR 73 1612 45 deg upright = 165/80 1614 50 deg upright = 153/83 1619 60 deg upright = 145/72, HR 83  Therapy Documentation Precautions:  Precautions Precautions: Fall Precaution Comments: R hemiplegia, R neglect Restrictions Weight Bearing Restrictions: No  Pain: Pain Assessment Pain Assessment: Faces Faces Pain Scale: Hurts even more Pain Type: Acute pain Pain Location: Neck Pain Orientation: Right Pain Descriptors / Indicators: Grimacing Pain Onset: With Activity Pain Intervention(s): Repositioned;Rest   See Function Navigator for Current Functional Status.   Therapy/Group: Individual Therapy  [name] 08/15/2015, 12:19 PM

## 2015-08-15 NOTE — Patient Care Conference (Signed)
Inpatient RehabilitationTeam Conference and Plan of Care Update Date: 08/12/2015   Time: 2:45 PM    Patient Name: [name]      Medical Record Number: [id number]  Date of Birth: 10-29-1940 Sex: Female         Room/Bed: 4W16C/4W16C-01 Payor Info: Payor: MEDICARE / Plan: MEDICARE PART A AND B / Product Type: *No Product type* /    Admitting Diagnosis: lt bg hemm  Admit Date/Time:  08/11/2015  2:47 PM Admission Comments: No comment available   Primary Diagnosis:  <principal problem not specified> Principal Problem: <principal problem not specified>  Patient Active Problem List   Diagnosis Date Noted  . Somnolence   . Left Basal ganglia hemorrhage ([place]) 08/11/2015  . Hypertensive emergency 08/11/2015  . Incontinence 08/11/2015  . Hypokalemia 08/11/2015  . Protein-calorie malnutrition ([place]) 08/11/2015  . Hemiplegia affecting dominant side ([place])   . Aphasia, post-stroke   . Dysphagia, post-stroke   . Dysphasia, post-stroke   . 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 Cognitive deficit, post-stroke   . Seizure prophylaxis   . Leukocytosis   . Benign essential HTN   . HLD (hyperlipidemia)   . Lethargy   . Altered mental status   . 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 Brain herniation ([place]) 08/05/2015  . ICH (intracerebral hemorrhage) ([place]) 08/01/2015  . Hepatitis 08/12/2010  . Renal failure 08/12/2010  . 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 CONJUNCTIVITIS, ALLERGIC 09/02/2008  . 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 ALLERGIC RHINITIS 09/02/2008  . BRONCHITIS 09/02/2008    Expected Discharge Date: Expected Discharge Date:  (3+ weeks)  Team Members Present: Physician leading conference: Dr. Alger Simons Social Worker Present: Lennart Pall, LCSW Nurse Present: Rayetta Pigg, RN PT Present: [name], PT OT Present: Roanna Epley, [place], OT SLP Present: Weston Anna, SLP PPS Coordinator present : Daiva Nakayama, RN, CRRN     Current  Status/Progress Goal Weekly Team Focus  Medical   left basal ganglia hemorrhage. course complicated by lethargy and behaioral issues. +seizure activity. poor po==needs NGT  improve nutrition, increase arousal to allow better participation in therapy  nutrition/ngt/seizure mgt/arousal, skin mgt   Bowel/Bladder   incontinent of bowel & bladder, LBM 4/20 ( unable to tolerate PO meds & meals)  patient to have regular bms  monitor bowel pattern, bowel program? bladder program?   Swallow/Nutrition/ Hydration   Currently on Dys. 2 textures with honey-thick liquids, Very limited PO intake due to arousal  TBD pending BSE when more alert  TBD pending BSE when more alert   ADL's     TBD   TBD     Mobility     TBD   TBD     Communication   Nonverbal, Total A  Mod A  arousal, multimodal communicaiton, yes/no accuracy    Safety/Cognition/ Behavioral Observations  Total A  Min-Mod A  arousal and following commands    Pain   no signs of pain or discomfort  remain pain free or pain to a manageable level      Skin   MSAD to perineum, R hip abrasion  free of skin breakdown or infection  assess skin q shift, Turn & reposition q2h      *See Care Plan and progress notes for long and short-term goals.  Barriers to Discharge: lethargy and severity of deficits.  nutrition    Possible Resolutions to Barriers:  ?stimulant, NGT today, ongoing NMR    Discharge Planning/Teaching Needs:  Plan to d/c home  with family providing/ arranging 24/7 assistance.  Teaching to be scheduled closer to d/c.   Team Discussion:  Veyr limited discussion on this first conference as pt has been virtually unresponsive and therapists have been unable to assess her abilities or set goals. NG tube placed and MD considering start of stimulant.  Team guessing they may set mod assist goals and may need 3+ week LOS.  Will re conference next week to confirm.  Revisions to Treatment Plan:  None   Continued Need for Acute  Rehabilitation Level of Care: The patient requires daily medical management by a physician with specialized training in physical medicine and rehabilitation for the following conditions: Daily direction of a multidisciplinary physical rehabilitation program to ensure safe treatment while eliciting the highest outcome that is of practical value to the patient.: Yes Daily medical management of patient stability for increased activity during participation in an intensive rehabilitation regime.: Yes Daily analysis of laboratory values and/or radiology reports with any subsequent need for medication adjustment of medical intervention for : Nutritional problems;Neurological problems  Magnolia Mattila 08/15/2015, 8:49 AM

## 2015-08-15 NOTE — Progress Notes (Signed)
Speech Language Pathology Daily Session Note  Patient Details  Name: [name] MRN: [id number] Date of Birth: 01-Oct-1940  Today's Date: 08/15/2015 SLP Individual Time: [id number] SLP Individual Time Calculation (min): 55 min  Short Term Goals: Week 1: SLP Short Term Goal 1 (Week 1): Patient will maintain arousal in regards to keeping her eyes open for ~60 seconds with Max A multimodal cues.  SLP Short Term Goal 2 (Week 1): Patient will follow basic 1 step commands in 50% of opporunities with Max A multimodal cues.  SLP Short Term Goal 3 (Week 1): Patient will answer basic yes/no questions utilizing multimodal communication with 50% accuracy with Max A multimodal cues.  SLP Short Term Goal 4 (Week 1): Patient will consume current diet without overt s/s of aspiration with Mod A verbal cues for use of swallowing compensatory strategies.   Skilled Therapeutic Interventions: Skilled treatment session focused on cognitive-linguistic goals. Upon arrival, patient was awake while sitting upright in bed. Patient declined all items off her breakfast tray despite Max encouragement and multiple attempts. SLP facilitated session by providing Mod A verbal and visual cues for patient to perform oral care via the suction toothbrush. Patient identified common objects from a field of 2 with 100% accuracy and answered basic yes/no questions in regards to biographical and environmental information with 60% accuracy with written aids but tended to choose the answer closest to her left hand. Patient was unable to match a written word to an object from a field of two. Patient vocalized X 3 and attempted to verbalize, but it was unintelligible. Patient left semi-reclined in bed with all needs within reach, alarm on and mitt/wrist restraint in place. Continue with current plan of care.   Function:   Cognition Comprehension Comprehension assist level: Understands basic less than 25% of the time/ requires cueing >75%  of the time  Expression   Expression assist level: Expresses basis less than 25% of the time/requires cueing >75% of the time.  Social Interaction Social Interaction assist level: Interacts appropriately less than 25% of the time. May be withdrawn or combative.  Problem Solving Problem solving assist level: Solves basic less than 25% of the time - needs direction nearly all the time or does not effectively solve problems and may need a restraint for safety  Memory Memory assist level: Recognizes or recalls less than 25% of the time/requires cueing greater than 75% of the time    Pain Pain Assessment Pain Assessment: Faces Faces Pain Scale: Hurts little more Pain Type: Acute pain Pain Location: Neck Pain Orientation: Right Pain Descriptors / Indicators: Grimacing Pain Onset: With Activity Pain Intervention(s): Repositioned;Rest  Therapy/Group: Individual Therapy  [name] 08/15/2015, 4:13 PM

## 2015-08-15 NOTE — Progress Notes (Signed)
Occupational Therapy Session Note  Patient Details  Name: [name] MRN: [id number] Date of Birth: 05-26-1940  Today's Date: 08/15/2015 OT Individual Time: 1030-1130 OT Individual Time Calculation (min): 60 min    Short Term Goals: Week 1:  OT Short Term Goal 1 (Week 1): Pt will participate in self care task for 2 minutes  in order to decrease level of assist with self care.  OT Short Term Goal 2 (Week 1): Pt will locate 3 items to the R during self care tasks by pointing to them in order to demonstrate visual scanning with mod cues. OT Short Term Goal 3 (Week 1): Pt will perform rolls to the left and right with max A or less during LB clothing management and hygiene n order to decrease level of care.  Skilled Therapeutic Interventions/Progress Updates:    Pt seen for skilled OT to facilitate attention, R side awareness, trunk control with ADL retraining. Pt had been cleansed and change in previous therapy session after bowel accident. Pt more alert today, keeping eyes open 60% of time at start of session. PROM to RUE as pt has mod tone in elb flexors and shoulder.  LB bathing and dressing from bed level with good initiation from pt to wash upper legs and to assist with pulling pants over hips. Pt does demonstrate apraxia and needs hand over hand guiding at times.  Sat to EOB with max A.  Worked on static sitting for several minutes maintaining static balance with close S for 30 sec at a time with cues to maintain L arm out to side for support.  Transferred to w/c with +2 with good forward lean and effort through L leg.  At sink in chair, pt became even more alert and attentive with UB self care.  Pt positioned in chair with pillows to support R arm and L mit and wrist restraint on.  [name] spouse attended therapy today and is in room with pt.  Therapy Documentation Precautions:  Precautions Precautions: Fall Precaution Comments: R hemiplegia, R neglect Restrictions Weight Bearing  Restrictions: No  Pain: Pain Assessment Pain Assessment: Faces Faces Pain Scale: Hurts little more Pain Type: Acute pain Pain Location: Neck Pain Orientation: Right Pain Descriptors / Indicators: Grimacing Pain Onset: With Activity Pain Intervention(s): Repositioned;Rest ADL:  See Function Navigator for Current Functional Status.   Therapy/Group: Individual Therapy  SAGUIER,JULIA 08/15/2015, 12:41 PM

## 2015-08-15 NOTE — Progress Notes (Signed)
Patient was being assisted with feeding by nurse tech. Per nurse tech after feeding patient refused to allow nurse tech to place the soft wrist restraint, and mitten. The nurse tech was attempting to call for assistance, and the patient pulled the tube out several inches. Attempted was made to advance tube, however patient wasn't cooperative.   Notified Kirsteins, MD given order for tube to be advanced in X-ray, and for placement to be checked. Called x-ray Estill Bamberg states MD needs to call Radiologist for advancement of tube, and for check of placement. Made Kirsteins aware of this given order for tube to be placed in the morning. Report given to oncoming nurse. [place]

## 2015-08-16 ENCOUNTER — Inpatient Hospital Stay (HOSPITAL_COMMUNITY): Payer: Medicare Other

## 2015-08-16 ENCOUNTER — Inpatient Hospital Stay (HOSPITAL_COMMUNITY): Payer: Medicare Other | Admitting: Physical Therapy

## 2015-08-16 ENCOUNTER — Inpatient Hospital Stay (HOSPITAL_COMMUNITY): Payer: Medicare Other | Admitting: Speech Pathology

## 2015-08-16 LAB — GLUCOSE, CAPILLARY
GLUCOSE-CAPILLARY: 100 mg/dL — AB (ref 65–99)
Glucose-Capillary: 97 mg/dL (ref 65–99)
Glucose-Capillary: 93 mg/dL (ref 65–99)
Glucose-Capillary: 95 mg/dL (ref 65–99)

## 2015-08-16 NOTE — Progress Notes (Signed)
Orthopedic Tech Progress Note Patient Details:  [name] October 02, 1940 [id number] Replacement boot  Ortho Devices Type of Ortho Device: Postop shoe/boot Ortho Device/Splint Location: RLE prafo boot Ortho Device/Splint Interventions: Ordered, Application   Braulio Bosch 08/16/2015, 9:23 PM

## 2015-08-16 NOTE — Progress Notes (Signed)
[facility]ICINE [facility] RE[facility]ABILITATION     PROGRESS NOTE    Subjective/Complaints: Pt pulled tube, awaiting tube placement under fluoro, missed Lipitor last noc On IVF at noc Pt is alert, aphasic  ROS limited due to cognitive status  Objective: Vital Signs: Blood pressure 176/76, pulse 78, temperature 97.6 F (36.4 C), temperature source Axillary, resp. rate 18, weight 64.1 kg (141 lb 5 oz), SpO2 97 %. Ct Head Wo Contrast  08/14/2015  CLINICAL DATA:  Intracranial hemorrhage, somnolent EXAM: CT HEAD WITHOUT CONTRAST TECHNIQUE: Contiguous axial images were obtained from the base of the skull through the vertex without intravenous contrast. COMPARISON:  08/08/2015 FINDINGS: Intraparenchymal hemorrhage in the left basal ganglia measures 4.1 x 3.2 cm (estimated hemorrhage volume 26 mL), previously 5.0 x 3.5 cm (estimated volume 35 mL). Surrounding vasogenic edema. 7 mm rightward midline shift (series 2/ image 14), previously 4 mm. Mild effacement of the left lateral ventricle. No intraventricular hemorrhage. Basal cisterns remain patent.  No downward herniation. No extra-axial fluid collection. No CT evidence of acute infarction. Subcortical white matter and periventricular small vessel ischemic changes. Intracranial atherosclerosis. Visualized paranasal sinuses are essentially clear. Partial opacification of the bilateral mastoid air cells, left greater than right. Mild global cortical atrophy.  No ventriculomegaly. No evidence of calvarial fracture. IMPRESSION: 4.1 x 3.2 cm intraparenchymal hemorrhage centered in the left basal ganglia (estimated hemorrhage volume 26 mL), decreased. Surrounding vasogenic edema with 7 mm rightward midline shift, previously 4 mm. Basilar cisterns remain patent. Electronically Signed   By: Julian Hy M.D.   On: 08/14/2015 11:23    Recent Labs  08/15/15 0722  WBC 9.1  HGB 10.7*  HCT 32.1*  PLT 365    Recent Labs  08/14/15 0545 08/15/15 0722   NA 140 140  K 3.0* 3.6  CL 105 106  GLUCOSE 135* 138*  BUN 8 8  CREATININE 0.55 0.54  CALCIUM 8.7* 8.8*   CBG (last 3)   Recent Labs  08/15/15 2030 08/15/15 2345 08/16/15 0422  GLUCAP 97 88 97    Wt Readings from Last 3 Encounters:  08/16/15 64.1 kg (141 lb 5 oz)  08/05/15 64.5 kg (142 lb 3.2 oz)  06/14/11 64.501 kg (142 lb 3.2 oz)    Physical Exam:  Constitutional: She appears well-developed, well nourished. NAD. HENT:  Head: Normocephalic. Atraumatic.  Eyes:  Pupils reactive to light  Neck: Normal range of motion. Neck supple. No thyromegaly present.  Cardiovascular: Normal rate and regular rhythm. + systolic Murmur Respiratory: Effort normal and breath sounds normal. No respiratory distress. no wheezes or rales GI: Soft. Bowel sounds are normal. She exhibits no distension.  Neurological:  Alert. Makes good eye contact. Squeezes Left hand to command DTRs 3+ right upper and right lower extremity Unable to assess sensation. Motor:  Right upper extremity and right lower extremity  0/5 Left upper and left lower extremity ~ 3+/5 or more (patient not participating in MMT) + Babinski bilaterally.  Flexor tone RUE Skin: Skin is warm and dry.   Assessment/Plan: 1. 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 Physiatrist is providing close team supervision and 24 hour management of active medical problems listed below. Physiatrist and rehab team continue to assess barriers to discharge/monitor patient progress toward functional and medical goals.  Function:  Bathing Bathing position   Position: Bed  Bathing parts Body parts bathed by patient:  Chest, Abdomen, Right upper leg, Left upper leg, Right arm Body parts bathed by helper: Left arm, Right lower leg, Left lower leg, Back  Bathing assist Assist Level: Touching or  steadying assistance(Pt > 75%), 2 helpers      Upper Body Dressing/Undressing Upper body dressing   What is the patient wearing?: Pull over shirt/dress     Pull over shirt/dress - Perfomed by patient: Put head through opening Pull over shirt/dress - Perfomed by helper: Thread/unthread right sleeve, Thread/unthread left sleeve, Pull shirt over trunk        Upper body assist Assist Level: 2 helpers      Lower Body Dressing/Undressing Lower body dressing Lower body dressing/undressing activity did not occur: N/A What is the patient wearing?: Pants, Socks, Shoes       Pants- Performed by helper: Thread/unthread right pants leg, Thread/unthread left pants leg, Pull pants up/down   Non-skid slipper socks- Performed by helper: Don/doff right sock, Don/doff left sock   Socks - Performed by helper: Don/doff right sock, Don/doff left sock   Shoes - Performed by helper: Don/doff right shoe, Don/doff left shoe, Fasten right, Fasten left       TED Hose - Performed by helper: Don/doff right TED hose, Don/doff left TED hose  Lower body assist        Toileting Toileting Toileting activity did not occur: N/A        Toileting assist Assist level: More than reasonable time, Two helpers   Transfers Chair/bed transfer   Chair/bed transfer method: Squat pivot Chair/bed transfer assist level: dependent (Pt equals 0%) Chair/bed transfer assistive device: Mechanical lift Mechanical lift: Maximove (no +2 available)   Locomotion Ambulation Ambulation activity did not occur: Safety/medical concerns         Wheelchair   Type: Manual (TIS wheelchair)   Assist Level: Dependent (Pt equals 0%)  Cognition Comprehension Comprehension assist level: Understands basic less than 25% of the time/ requires cueing >75% of the time  Expression Expression assist level: Expresses basis less than 25% of the time/requires cueing >75% of the time.  Social Interaction Social Interaction assist level:  Interacts appropriately less than 25% of the time. May be withdrawn or combative.  Problem Solving Problem solving assist level: Solves basic less than 25% of the time - needs direction nearly all the time or does not effectively solve problems and may need a restraint for safety  Memory Memory assist level: Recognizes or recalls less than 25% of the time/requires cueing greater than 75% of the time   Medical Problem List and Plan: 1. 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 DVT Prophylaxis/Anticoagulation: Subcutaneous heparin initiated 08/09/2015 3. Pain Management: Hydrocodone as needed 4. Dysphagia. Dysphagia #2 honey thick liquids.  -continue IV fluids for hydration--change to hs only to make therapy easier   -NGT in place  -left wrist restraint required to keep NGT in place 5. Neuropsych: This patient is not capable of making decisions on her own behalf. 6. Skin/Wound Care: Routine skin checks 7. Decreased nutritional storage. Dietary follow-up. Check calorie counts  -NGT removed per pt await reinsertion 8. Fluids/Electrolytes/Nutrition: following lytes, supplementing PO with NGT until more alert  -continue replete potassium  -  9. Seizure prophylaxis. Keppra held for somnolence EEG neg, Hold per neuro 10. Leukocytosis. Follow-up urine study/follow-up CBC 11. Hypertension. Cozaar 100 mg daily. Monitor with increased mobility 12. Hyperlipidemia.  Lipitor 13. Lethargy post stroke:improved on ritalin but didn't receive this am and remains alert, some mild agitation/irritability last noc pulled tube and refused meds  -continue ritalin  -keppra stopped by neurology,    -appreciate neuro help 14. Low grade temp resolved  -ua unremarkable. -given defervescence will hold on cx  -CXR clear 15. Spasticity: PRAFO RLE  -continue  ROM/modalities  -would like to avoid antispasmodics due to arousal level 16. Loose stool: likely from TF formula  -formula changed  -continue probiotic. Added fiber   LOS (Days) 5 A FACE TO FACE EVALUATION WAS PERFORMED  Charlett Blake 08/16/2015 8:38 AM

## 2015-08-16 NOTE — Progress Notes (Signed)
Patients family concerned about arrival of new prafo boot ordered on 08/13/15. Ortho tech paged for new prafo boot at 2030. Ortho tech applied new ortho boot at 2110 to right foot. Instructions left in room on how to wash pads if soiled due to cost. adm

## 2015-08-16 NOTE — Progress Notes (Signed)
Speech Language Pathology Daily Session Note  Patient Details  Name: [name] MRN: [id number] Date of Birth: June 14, 1940  Today's Date: 08/16/2015 SLP Individual Time: 0156-1537 SLP Individual Time Calculation (min): 45 min  Short Term Goals: Week 1: SLP Short Term Goal 1 (Week 1): Patient will maintain arousal in regards to keeping her eyes open for ~60 seconds with Max A multimodal cues.  SLP Short Term Goal 2 (Week 1): Patient will follow basic 1 step commands in 50% of opporunities with Max A multimodal cues.  SLP Short Term Goal 3 (Week 1): Patient will answer basic yes/no questions utilizing multimodal communication with 50% accuracy with Max A multimodal cues.  SLP Short Term Goal 4 (Week 1): Patient will consume current diet without overt s/s of aspiration with Mod A verbal cues for use of swallowing compensatory strategies.   Skilled Therapeutic Interventions: Skilled ST session focused on dysphagia and cognitive goals. Pt had pulled out her NG feeding tube on 08/15/2015. [name] husband requested trials of PO this morning before having the tube replaced. SLP consulted with MD and nursing. MD, nursing, myself and patient's husband met in pt's room. MD requested SLP attempt trials of medicine crushed  in puree. If pt tolerates, then SLP could continue with puree breakfast items. SLP attempted to arouse pt with max verbal, thermal, tactile and repositioning strategies. Pt with on overt response. SLP provided oral care. Nursing and SLP attempted small bolus of applesauce with patient unable to effectively open mouth for intake. Even with max verbal and tactile cues, pt was not able to attend to bolus or demonstrate any level of arousal. SLP made decision to discontinue trials and provided suction after trials. Husband voiced agreement and MD to request NG tube to be place this day. Pt left in bed with bed alarm on, wrist guards in place and all needs within reach. Continue with current plan of  care.   Function:  Eating Eating   Modified Consistency Diet: Yes Eating Assist Level: Helper scoops food on utensil;Supervision or verbal cues;More than reasonable amount of time;Helper brings food to mouth;Help with picking up utensils;Helper checks for pocketed food;Help managing cup/glass;Hand over hand assist   Eating Set Up Assist For: Opening containers Helper Scoops Food on Utensil: Every scoop Helper Brings Food to Mouth: Every scoop   Cognition Comprehension Comprehension assist level: Understands basic less than 25% of the time/ requires cueing >75% of the time  Expression   Expression assist level: Expresses basis less than 25% of the time/requires cueing >75% of the time.  Social Interaction Social Interaction assist level: Interacts appropriately less than 25% of the time. May be withdrawn or combative.  Problem Solving Problem solving assist level: Solves basic less than 25% of the time - needs direction nearly all the time or does not effectively solve problems and may need a restraint for safety  Memory Memory assist level: Recognizes or recalls less than 25% of the time/requires cueing greater than 75% of the time    Pain Pain Assessment Pain Assessment: Faces Faces Pain Scale: Hurts little more Pain Location: Generalized Pain Intervention(s): Repositioned;Distraction;Emotional support  Therapy/Group: Individual Therapy  Kennetha Pearman 08/16/2015, 1:51 PM

## 2015-08-16 NOTE — Progress Notes (Signed)
Physical Therapy Session Note  Patient Details  Name: [name] MRN: [id number] Date of Birth: Oct 27, 1940  Today's Date: 08/16/2015 PT Individual Time: 1300-1400 PT Individual Time Calculation (min): 60 min   Short Term Goals: Week 1:  PT Short Term Goal 1 (Week 1): Patient will maintain arousal for 60 min consistently with min multimodal cues. PT Short Term Goal 2 (Week 1): Patient will follow basic 1 step commands in 75% of available opportunities.  PT Short Term Goal 3 (Week 1): Patient will maintain sitting balance x 5 min with supervision.  PT Short Term Goal 4 (Week 1): Patient will initiate ambulation.   Skilled Therapeutic Interventions/Progress Updates:   Pt demonstrates improvement responding to automatic activities such as writing, high five and kicking ball. Pt able to write name out multiple times on paper in session. Pt with inconsistent assist with functional mobility, sometimes able to transfers with max A. Pt with difficulty in pre-gait activities due to decreased initiation not unweighting LLE even with cueing. Pt would continue to benefit from skilled PT services to increase functional mobility  Therapy Documentation Precautions:  Precautions Precautions: Fall Precaution Comments: R hemiplegia, R neglect, nonverbal Restrictions Weight Bearing Restrictions: No Vital Signs: Therapy Vitals Temp: 97.6 F (36.4 C) Temp Source: Oral Pulse Rate: 71 Resp: 18 BP: (!) 145/70 mmHg Patient Position (if appropriate): Lying Oxygen Therapy SpO2: 98 % O2 Device: Not Delivered Pain: Pain Assessment Pain Assessment: No/denies pain Mobility:  Total Assist with periods of Max A with cues for weight shift and sequencing  Other Treatments:  Pt and family educated on rehab plan, safety in mobility, deficits, and arousal. Pt performs bed mobility with dual motor tasks including weight shifting and grasping within functional context. Transfers x10 in session. Pt performs  sitting EOM with fluctuating levels of assist Max A to [employer] for 30 min with dual motor tasks such as kicking ball, performing high fives, hand shakes, writing, anterior weight shifting and attending to stim in all visual fields including R side. Static standing 2'x2, and 1' with cues for upright posture. Pt performs B/L weight shifting 2x10 and BLE advancement x3 B/L dependent on therapist for advancement.  See Function Navigator for Current Functional Status.   Therapy/Group: Individual Therapy  Monia Pouch 08/16/2015, 4:16 PM

## 2015-08-16 NOTE — Progress Notes (Signed)
STROKE TEAM PROGRESS NOTE   SUBJECTIVE (INTERVAL HISTORY) No family members present. A therapist is currently working with the patient. Her level of alertness continues to fluctuate. Now on Ritalin 5 mg BID started Thursday.  OBJECTIVE Temp:  [97.6 F (36.4 C)-98.7 F (37.1 C)] 97.6 F (36.4 C) (04/29 1500) Pulse Rate:  [71-78] 71 (04/29 1500) Cardiac Rhythm:  [-]  Resp:  [18] 18 (04/29 1500) BP: (145-176)/(70-84) 145/70 mmHg (04/29 1500) SpO2:  [97 %-98 %] 98 % (04/29 1500) Weight:  [141 lb 5 oz (64.1 kg)] 141 lb 5 oz (64.1 kg) (04/29 0446)  CBC:   Recent Labs Lab 08/12/15 0742 08/15/15 0722  WBC 11.9* 9.1  NEUTROABS 8.5*  --   HGB 11.5* 10.7*  HCT 33.5* 32.1*  MCV 86.8 90.2  PLT 347 [id number]    Basic Metabolic Panel:   Recent Labs Lab 08/11/15 0354  08/14/15 0545 08/15/15 0722  NA 137  < > 140 140  K 3.6  < > 3.0* 3.6  CL 105  < > 105 106  CO2 16*  < > 24 22  GLUCOSE 80  < > 135* 138*  BUN 12  < > 8 8  CREATININE 0.72  < > 0.55 0.54  CALCIUM 8.8*  < > 8.7* 8.8*  MG 1.8  --   --   --   < > = values in this interval not displayed.  Lipid Panel: No results found for: CHOL, TRIG, HDL, CHOLHDL, VLDL, LDLCALC HgbA1c: No results found for: HGBA1C Urine Drug Screen: No results found for: LABOPIA, COCAINSCRNUR, LABBENZ, AMPHETMU, THCU, LABBARB    IMAGING I have personally reviewed the radiological images below and agree with the radiology interpretations. Blue text is my interpretation  Ct Head Wo Contrast  08/01/2015   1. Left basal ganglia hemorrhagic infarct.  2. Mild chronic small vessel white matter ischemic changes in both cerebral hemispheres.  3. Chronic bilateral frontal, bilateral ethmoid and left maxillary sinusitis.  4. Acute right maxillary sinusitis.   08/03/2015 1. Increasing size of left basal ganglia hemorrhage since last CT (but same as on MRI), now measuring 5.1 x 3.5 x 4.5 cm. 2. Increasing surrounding vasogenic edema and mass effect with  progressive effacement of the left lateral ventricle and midline shift now measuring 3.5 mm. 3. Diffuse sinus disease.  08/08/2015 1. Left hemisphere hyperdense intra-axial hemorrhage is stable to slightly increased since 08/06/2015. Estimated hemorrhage volume 35 mL. 2. Surrounding edema and regional mass effect are stable, including mild rightward midline shift of 4 mm. 3. No new intracranial abnormality.  08/14/2015  IMPRESSION: 4.1 x 3.2 cm intraparenchymal hemorrhage centered in the left basal ganglia (estimated hemorrhage volume 26 mL), decreased. Surrounding vasogenic edema with 7 mm rightward midline shift, previously 4 mm. Basilar cisterns remain patent. However, by my interpretation, there is no significant change of midline shift comparing with CT on 08/08/15 and the hematoma is in the process to be absorbed.  Chest Port 1 View 08/01/2015   1. No acute findings.  2. Accentuation of the mediastinal contours may be due to AP technique and low lung volumes.   08/09/2015 No active disease.  MRA head without contrast 08/03/2015 1. No vascular abnormality or aneurysm underlying the left basal ganglia hemorrhage identified. 2. No large or proximal arterial branch occlusion. Short-segment stenoses at the proximal left M1 and mid right M1 segments. 3. 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  MRI with and without contrast  08/02/2015 1. Increased size of acute intraparenchymal hemorrhage centered at the left basal ganglia, now measuring 5.6 x 3.3 x 3.6 cm (estimated volume 33 mL). Associated localized vasogenic edema and mass effect with up to 6 mm of left-to-right shift. Intraventricular extension with small volume blood layering in the occipital horns of both lateral ventricles. No hydrocephalus or evidence of ventricular trapping. 2. No  other acute intracranial abnormality. 3. Mild chronic small vessel ischemic disease.  EEG  08/06/15 focal left sided slowing but no seizures  08/09/15 Abnormal awake and drowsy routine inpatient EEG suggestive of underlying neuronal dysfunction in the left frontotemporal region, with epileptogenic potential as described. No evidence of electrographic seizures were seen during this recording.  EEG repeat 08/15/15 - pending  TTE -  - Left ventricle: The cavity size was normal. Wall thickness was  increased in a pattern of mild LVH. Systolic function was normal.  The estimated ejection fraction was in the range of 60% to 65%.  Wall motion was normal; there were no regional wall motion  abnormalities. Doppler parameters are consistent with abnormal  left ventricular relaxation (grade 1 diastolic dysfunction). - Aortic valve: There was mild stenosis. Valve area (VTI): 1.1  cm^2. Valve area (Vmax): 1.15 cm^2. Valve area (Vmean): 1.15  cm^2. - Mitral valve: Mildly calcified annulus. There was mild  regurgitation.   PHYSICAL EXAM  Temp:  [97.6 F (36.4 C)-98.7 F (37.1 C)] 97.6 F (36.4 C) (04/29 1500) Pulse Rate:  [71-78] 71 (04/29 1500) Resp:  [18] 18 (04/29 1500) BP: (145-176)/(70-84) 145/70 mmHg (04/29 1500) SpO2:  [97 %-98 %] 98 % (04/29 1500) Weight:  [141 lb 5 oz (64.1 kg)] 141 lb 5 oz (64.1 kg) (04/29 0446)  General - Well nourished, well developed, in no apparent distress.  Ophthalmologic - Fundi not visualized due to noncooperation.  Cardiovascular - Regular rate and rhythm with no murmur.  Neuro - awake alert and tracking to objects. Still not making language outpt but able to make minimal sound and lip language. Follow some simple commands but not all of them. PERRL, blinking to visual threat bilaterally, right facial droop, tongue in midline. LUE 0/5 and LLE no spontaneous movement but on pain stimulation left knee extension 2+/5. LUE increased tone but flaccid  LLE. RUE and RLE 5/5. DTR 1+. Sensation, coordination and gait not tested.   ASSESSMENT/PLAN [name] is a 75 y.o. female with history hypertension and hyperlipidemia presenting with right hemiparesis and speech difficulties.  She did not receive IV t-PA due to large left basal ganglia hemorrhage.  Hemorrhagic Stroke:  Dominant L basal ganglia hemorrhage with cytotoxic cerebral edema and midline shift, hemorrhage probably secondary to hypertension.  Resultant  Drowsy with dense right hemiplegia, aphasia and dysphagia  MRI acute left BG ICH   MRA  No vascular abnormality or aneurysm underlying the left basal ganglia hemorrhage identified.  Repeat CT   no significant change of midline shift comparing with CT head on 08/08/15. Hematoma size decreasing.  2D Echo - EF 60-65%, mild AS - performed January 2017  VTE prophylaxis - heparin subq  DIET - DYS 1 Room service appropriate?: Yes; Fluid consistency:: Honey Thick.   No antithrombotic prior to admission, now on No antithrombotic secondary to hemorrhage.  Ongoing aggressive stroke risk factor management  Fluctuating mental status - likely sleep wake cycle disturbance due to thalamus/hypothalamus involvement ([facility]) - d/c keppra or  lipitor to avoid any side effects  EEG - 08/06/15 focal left sided slowing but no seizures  EEG - 08/09/15 Abnormal awake and drowsy routine inpatient EEG suggestive of underlying neuronal dysfunction in the left frontotemporal region, with epileptogenic potential as described. No evidence of electrographic seizures were seen during this recording. Clinical correlation is recommended.   Due to abnormal EEG, started keppra 500mg  bid. However, keppra discontinued to rule out side effect of somnolence.   Repeat EEG 08/15/2015 - left anterior temporal slow activity - no seizure or seizure predisposition noted  UA clear 08/11/15  WBC 9.8-> 13.0->11.9  Afebrile   CXR no infection or  pneumonia  Encourage increase daily activity, correct sleep wake cycle disturbance  Ritalin 5 mg BID started Thursday   Antony Contras, MD Stroke Neurology 08/16/2015 4:21 PM   To contact Stroke Continuity provider, please refer to [email or web address] After hours, contact General Neurology

## 2015-08-16 NOTE — Progress Notes (Signed)
Occupational Therapy Session Note  Patient Details  Name: [name] MRN: [id number] Date of Birth: 03-22-1941  Today's Date: 08/16/2015 OT Individual Time: 1000-1100 OT Individual Time Calculation (min): 60 min    Short Term Goals: Week 1:  OT Short Term Goal 1 (Week 1): Pt will participate in self care task for 2 minutes  in order to decrease level of assist with self care.  OT Short Term Goal 2 (Week 1): Pt will locate 3 items to the R during self care tasks by pointing to them in order to demonstrate visual scanning with mod cues. OT Short Term Goal 3 (Week 1): Pt will perform rolls to the left and right with max A or less during LB clothing management and hygiene n order to decrease level of care.  Skilled Therapeutic Interventions/Progress Updates: ADL-retraining at bed level with focus on improved alertness and attention during treatment and bed mobility.   Pt received supine in bed asleep and requiring max assist to maintain alertness using increased environmental, proprioceptive and tactile stimulation to engage in session.   Pt  demo'd improved engagement when HOB elevated to max upright position during upper body dressing.   Pt only able to wash her stomach and left upper leg during session despite repeated attempts to encourage thoroughness, pt frequently closed her eyes.   After total assist to wash lower body, change brief and don pants and socks, pt placed left hand through sleeve of shirt and adjusted bottom of shirt after OT completed donning shirt.   Pt required max assist to rise to sit at EOB but maintained sitting balance for 50 seconds after facilitation to place left hand and position pt for supported lean to left.   Pt complete stand-pivot transfer to w/c with max assist (pt assisted in stand approx 70%).   QRB placed with pt reclined in w/c after total assist for hair care.     Therapy Documentation Precautions:  Precautions Precautions: Fall Precaution Comments: R  hemiplegia, R neglect Restrictions Weight Bearing Restrictions: No   Pain: Pain Assessment Pain Assessment: Faces Faces Pain Scale: Hurts little more Pain Location: Generalized Pain Intervention(s): Repositioned;Distraction;Emotional support  See Function Navigator for Current Functional Status.   Therapy/Group: Individual Therapy   Second session: Time: [id number] Time Calculation (min):  30 min  Pain Assessment: No/denies pain  Skilled Therapeutic Interventions: Therapeutic activity with focus on transfers, sitting balance, sensory/motor integration of RUE seated on raised mat.   Pt received in w/c and escorted to gym.   Pt completed transfer to raised mat with overall max assist although contributing significantly to transfer (pt=70% with lift, pivot, and lowering) with facilitation to weight-shift and weight-bear through RLE.  Pt much more alert during afternoon session, eyes open 100% and responsive to OT cues.    Pt sustained sitting balance unsupported on mat with intermittent steadying assist during PROM of RUE and facilitated weight-bearing through RUE.  Pt transfer back to w/c as her husband arrived at end of session and was instructed (demo) on transfer from w/c back to bed in pt's room.  Husband encouraged to remove TEDs with pt supine however he was reluctant to attempt task as RN arrived to place NG tube for improved nutritional intake.   See FIM for current functional status  Therapy/Group: Individual Therapy  [place] 08/16/2015, 12:42 PM

## 2015-08-17 LAB — GLUCOSE, CAPILLARY
GLUCOSE-CAPILLARY: 93 mg/dL (ref 65–99)
Glucose-Capillary: 86 mg/dL (ref 65–99)
Glucose-Capillary: 90 mg/dL (ref 65–99)
Glucose-Capillary: 94 mg/dL (ref 65–99)

## 2015-08-17 MED ORDER — POTASSIUM CHLORIDE IN NACL 20-0.45 MEQ/L-% IV SOLN
INTRAVENOUS | Status: DC
  Administered 2015-08-17 – 2015-08-23 (×7): via INTRAVENOUS
  Administered 2015-08-24: 75 mL/h via INTRAVENOUS
  Administered 2015-08-26: 18:00:00 via INTRAVENOUS
  Filled 2015-08-17 (×20): qty 1000

## 2015-08-17 NOTE — Progress Notes (Signed)
PHYSICAL MEDICINE & REHABILITATION     PROGRESS NOTE    Subjective/Complaints: Appreciate Dr Leonie Man note, pulled tube again Pt is alert, aphasic  ROS limited due to cognitive status  Objective: Vital Signs: Blood pressure 153/72, pulse 69, temperature 97.8 F (36.6 C), temperature source Axillary, resp. rate 17, weight 62.3 kg (137 lb 5.6 oz), SpO2 98 %. Dg Abd Portable 1v  08/16/2015  CLINICAL DATA:  Encounter for feeding tube placement. EXAM: PORTABLE ABDOMEN - 1 VIEW COMPARISON:  August 02, 2015. FINDINGS: The bowel gas pattern is normal. Distal tip of feeding tube is seen in expected position of distal stomach or proximal duodenum. No definite calculi are noted. IMPRESSION: Distal tip of feeding tube seen in expected position of distal stomach or proximal duodenum. Electronically Signed   By: Marijo Conception, M.D.   On: 08/16/2015 16:10    Recent Labs  08/15/15 0722  WBC 9.1  HGB 10.7*  HCT 32.1*  PLT 365    Recent Labs  08/15/15 0722  NA 140  K 3.6  CL 106  GLUCOSE 138*  BUN 8  CREATININE 0.54  CALCIUM 8.8*   CBG (last 3)   Recent Labs  08/16/15 2008 08/17/15 0011 08/17/15 0541  GLUCAP 100* 86 94    Wt Readings from Last 3 Encounters:  08/17/15 62.3 kg (137 lb 5.6 oz)  08/05/15 64.5 kg (142 lb 3.2 oz)  06/14/11 64.501 kg (142 lb 3.2 oz)    Physical Exam:  Constitutional: She appears well-developed, well nourished. NAD. HENT:  Head: Normocephalic. Atraumatic.  Eyes:  Pupils reactive to light  Neck: Normal range of motion. Neck supple. No thyromegaly present.  Cardiovascular: Normal rate and regular rhythm. + systolic Murmur Respiratory: Effort normal and breath sounds normal. No respiratory distress. no wheezes or rales GI: Soft. Bowel sounds are normal. She exhibits no distension.  Neurological:  Alert. Makes good eye contact. Squeezes Left hand to command, non verbal DTRs 3+ right upper and right lower extremity Unable to  assess sensation except does not withdraw to pinch in RUE and RLE. Motor:  Right upper extremity and right lower extremity  0/5 Left upper and left lower extremity ~ 3+/5 or more (patient not participating in MMT) + Babinski bilaterally.  Flexor tone RUE Skin: Skin is warm and dry.   Assessment/Plan: 1. 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 Physiatrist is providing close team supervision and 24 hour management of active medical problems listed below. Physiatrist and rehab team continue to assess barriers to discharge/monitor patient progress toward functional and medical goals.  Function:  Bathing Bathing position   Position: Bed  Bathing parts Body parts bathed by patient: Left upper leg, Abdomen Body parts bathed by helper: Right arm, Left arm, Chest, Front perineal area, Right upper leg, Right lower leg, Left lower leg, Back  Bathing assist Assist Level:  (Total assist)      Upper Body Dressing/Undressing Upper body dressing   What is the patient wearing?: Pull over shirt/dress     Pull over shirt/dress - Perfomed by patient: Thread/unthread left sleeve Pull over shirt/dress - Perfomed by helper: Thread/unthread right sleeve, Put head through opening, Pull shirt over trunk        Upper body assist Assist Level:  (Max Assist)      Lower Body Dressing/Undressing Lower body dressing Lower body dressing/undressing activity did not  occur: N/A What is the patient wearing?: Pants, Non-skid slipper socks       Pants- Performed by helper: Thread/unthread right pants leg, Thread/unthread left pants leg, Pull pants up/down, Fasten/unfasten pants   Non-skid slipper socks- Performed by helper: Don/doff right sock, Don/doff left sock   Socks - Performed by helper: Don/doff right sock, Don/doff left sock   Shoes - Performed by helper:  Don/doff right shoe, Don/doff left shoe, Fasten right, Fasten left       TED Hose - Performed by helper: Don/doff right TED hose, Don/doff left TED hose  Lower body assist Assist for lower body dressing:  (Total assist)      Toileting Toileting Toileting activity did not occur: N/A        Toileting assist Assist level: More than reasonable time, Two helpers   Transfers Chair/bed transfer   Chair/bed transfer method: Squat pivot Chair/bed transfer assist level: Total assist (Pt < 25%) Chair/bed transfer assistive device: Mechanical lift Mechanical lift:  (no +2 available)   Locomotion Ambulation Ambulation activity did not occur: Safety/medical concerns         Wheelchair   Type: Manual (TIS wheelchair)   Assist Level: Dependent (Pt equals 0%)  Cognition Comprehension Comprehension assist level: Understands basic less than 25% of the time/ requires cueing >75% of the time  Expression Expression assist level: Expresses basis less than 25% of the time/requires cueing >75% of the time.  Social Interaction Social Interaction assist level: Interacts appropriately less than 25% of the time. May be withdrawn or combative.  Problem Solving Problem solving assist level: Solves basic less than 25% of the time - needs direction nearly all the time or does not effectively solve problems and may need a restraint for safety  Memory Memory assist level: Recognizes or recalls less than 25% of the time/requires cueing greater than 75% of the time   Medical Problem List and Plan: 1. Right hemiplegia, aphasia, dysphagia secondary to left basal ganglia hypertensive intracranial hemorrhage  -continue CIR therapies  -appreciate Neuro note, EEG  neg for seizure  2. DVT Prophylaxis/Anticoagulation: Subcutaneous heparin initiated 08/09/2015 3. Pain Management: Hydrocodone as needed 4. Dysphagia. Dysphagia #2 honey thick liquids.  -continue IV fluids for hydration--change to hs only to make  therapy easier   -NGT in place  -left wrist restraint required to keep NGT in place 5. Neuropsych: This patient is not capable of making decisions on her own behalf. 6. Skin/Wound Care: Routine skin checks, right heel looks ok,  7. Decreased nutritional storage. Dietary follow-up. Check calorie counts  -NGT removed per pt again, given poor intake will order reinsertion, reportedly had mitt on, took meds po except KCL, will order for IV in bag at night 8. Fluids/Electrolytes/Nutrition: following lytes, supplementing PO with NGT , poor po intake  -continue replete potassium  -  9. Seizure prophylaxis. Keppra held for somnolence EEG neg, Hold per neuro 10. Leukocytosis. Follow-up urine study/follow-up CBC 11. Hypertension. Cozaar 100 mg daily. Monitor with increased mobility 12. Hyperlipidemia. Lipitor 13. Lethargy post stroke:improved on ritalin and remains alert, some mild agitation/irritability last noc pulled tube again and refused KCL  -continue ritalin  -keppra stopped by neurology,    -appreciate neuro help 14. Low grade temp resolved  -ua unremarkable. -given defervescence will hold on cx  -CXR clear 15. Spasticity: PRAFO RLE  -continue ROM/modalities  -would like to avoid antispasmodics due to arousal level 16. Loose stool: likely from TF formula  -formula changed  -continue probiotic. Added  fiber   LOS (Days) 6 A FACE TO FACE EVALUATION WAS PERFORMED  Charlett Blake 08/17/2015 8:31 AM

## 2015-08-17 NOTE — Progress Notes (Signed)
Patient restraints are off at this time, MD aware. Restraints necessary to keep patient from removing NG tube. New tube not placed. Contacted ICU unit and 2 Heart. No RN available to place NG today. MD made aware, ordered to encourage patient's oral intake. Restraints not used during day shift, order dc until NG tube placed.

## 2015-08-17 NOTE — Care Management Note (Signed)
Inpatient [facility] Individual Statement of Services  Patient Name:  [name]  Date:  08/14/2015  Welcome to the [facility].  Our goal is to provide you with an individualized program based on your diagnosis and situation, designed to meet your specific needs.  With this comprehensive rehabilitation program, you will be expected to participate in at least 3 hours of rehabilitation therapies Monday-Friday, with modified therapy programming on the weekends.  Your rehabilitation program will include the following services:  Physical Therapy (PT), Occupational Therapy (OT), Speech Therapy (ST), 24 hour per day rehabilitation nursing, Therapeutic Recreaction (TR), Neuropsychology, Case Management (Social Worker), Rehabilitation Medicine, Nutrition Services and Pharmacy Services  Weekly team conferences will be held on Tuesdays to discuss your progress.  Your Social Worker will talk with you frequently to get your input and to update you on team discussions.  Team conferences with you and your family in attendance may also be held.  Expected length of stay: 3+ weeks  Overall anticipated outcome: moderate assistance  Depending on your progress and recovery, your program may change. Your Social Worker will coordinate services and will keep you informed of any changes. Your Social Worker's name and contact numbers are listed  below.  The following services may also be recommended but are not provided by the [facility] will be made to provide these services after discharge if needed.  Arrangements include referral to agencies that provide these services.  Your insurance has been verified to be:  Medicare and [place] Your primary doctor is:  Dr. Joylene Draft  Pertinent information will be shared with your doctor and your insurance  company.  Social Worker:  [place], [place] or ([phone]   Information discussed with and copy given to patient by: Lennart Pall, 08/14/2015, 12:08 PM

## 2015-08-17 NOTE — Progress Notes (Signed)
Social Work  Social Work Assessment and Plan  Patient Details  Name: [name] MRN: [id number] Date of Birth: [birth year]  Today's Date: 08/14/2015  Problem List:  Patient Active Problem List   Diagnosis Date Noted  . Somnolence   . 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 Hypertensive emergency 08/11/2015  . Incontinence 08/11/2015  . Hypokalemia 08/11/2015  . Protein-calorie malnutrition ([place]) 08/11/2015  . Hemiplegia affecting dominant side ([place])   . Aphasia, post-stroke   . Dysphagia, post-stroke   . Dysphasia, post-stroke   . 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 Cognitive deficit, post-stroke   . Seizure prophylaxis   . Leukocytosis   . Benign essential HTN   . HLD (hyperlipidemia)   . Lethargy   . Altered mental status   . Cytotoxic brain edema ([place]) 08/05/2015  . Brain herniation ([place]) 08/05/2015  . ICH (intracerebral hemorrhage) ([place]) 08/01/2015  . Hepatitis 08/12/2010  . Renal failure 08/12/2010  . 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 CONJUNCTIVITIS, ALLERGIC 09/02/2008  . 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 ALLERGIC RHINITIS 09/02/2008  . BRONCHITIS 09/02/2008   Past Medical History:  Past Medical History  Diagnosis Date  . Bronchitis, not specified as acute or chronic   . Other chronic sinusitis   . Other chronic allergic conjunctivitis   . Allergic rhinitis, cause unspecified   . 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 Acute renal insufficiency     due to amphotericin 2012  . Irritable bowel   . Hypertension   . Diverticulosis    Past Surgical History:  Past Surgical History  Procedure Laterality Date  . Cesarean section      x 4  . Vesicovaginal fistula closure w/ tah    . Breast lumpectomy      Right-benign  . Tonsillectomy    . Appendectomy    . Bronchoscopy      Video bronch w/ TBBX 2012   Social History:  reports that she has never smoked. She has never used smokeless tobacco. She reports that she  drinks alcohol. Her drug history is not on file.  Family / Support Systems Marital Status: Married How Long?: since 2005 - 2nd marriage for both Patient Roles: Spouse, Parent Spouse/Significant Other: Dr. Genelle Bal @ (H) [phone] or ([phone] Children: Pt has 3 adult sons with two living in [place] and one in [place]:  Anastasio Champion @ (C) [phone];  Annie Main and Hendricks Milo. Anticipated Caregiver: Spouse: Dr. Genelle Bal Ability/Limitations of Caregiver: None Caregiver Availability: 24/7 Family Dynamics: Husband and sons very involved and attentive.  Husband reports good communication between himself and pt's sons.  Social History Preferred language: English Religion:  Cultural Background: NA Read: Yes Write: Yes Employment Status: Retired Date Retired/Disabled/Unemployed: Husband reports "she hasn't really worked for several yearsSecretary/administrator Issues: None Guardian/Conservator: Husband reports that pt's son share Healthcare POA.     Abuse/Neglect Physical Abuse: Denies Verbal Abuse: Denies Sexual Abuse: Denies Exploitation of patient/patient's resources: Denies Self-Neglect: Denies  Emotional Status [name] affect, behavior adn adjustment status: Pt with severe cognitive and arousal deficits and unable to participate in assessment interview.  Spouse provides all information.  Pt occasionally looks at the two of [place].  Cannot assess affect or adjustment issues but will follow along and also refer for neuropsychology input when appropriate. Recent Psychosocial Issues: None Pyschiatric History: None Substance Abuse History: None  Patient / Family Perceptions, Expectations & Goals Pt/Family understanding of illness & functional  limitations: Spouse and sons have very good understanding of pt's medical condition.  Please note that spouse is retired [place] MD. Premorbid pt/family roles/activities: Pt and spouse were very active in the community.  Pt  completely independent.  Spouse notes she enjoyed weekly bridge games and was active on several community non-profits. Anticipated changes in roles/activities/participation: Pt expected to require 24/7 assistance, however, tx have had a difficulty time completing a thorough assessment due to poor arousal.  Husband to provide primary caregiver support as well as considering other options. Pt/family expectations/goals: Spouse currently focused on primary goals that pt be able to participate fully in [place]: None Premorbid Home Care/DME Agencies: None Transportation available at discharge: yes Resource referrals recommended: Neuropsychology  Discharge Planning Living Arrangements: Spouse/significant other Support Systems: Spouse/significant other, Children, Water engineer, Social worker community Type of Residence: Private residence Insurance Resources: [employer], Multimedia programmer (specify) Financial Resources: [employer] Referred: No Living Expenses: Own Money Management: Spouse Does the patient have any problems obtaining your medications?: No Home Management: pt and spouse Patient/Family Preliminary Plans: Spouse reports that they are on the "waitlist" at [facility] and considering various options of assist in home vs SNF at [facility] or another facility. Social Work Anticipated Follow Up Needs: SNF, HH/OP Expected length of stay: 3-4 weeks   Clinical Impression Very unfortunate woman here following a significant ICH and with severe deficits in arousal, cognition and physical abilities.  Pt cannot participate in assessment interview and spouse provides all needed information.  Will monitor her emotional adjustment as her arousal and participation improve.  Pt has 3 adult sons (this is her 2nd marriage) and all very involved and supportive.  Spouse is retired Engineer, drilling.  He is uncertain what the d/c plan is for pt at this  time but has all options available including private duty to SNF (already on waitlist at [facility]).  Will follow for support and d/c planning needs.  Havah Ammon 08/14/2015, 12:00 PM

## 2015-08-18 ENCOUNTER — Inpatient Hospital Stay (HOSPITAL_COMMUNITY): Payer: Medicare Other

## 2015-08-18 ENCOUNTER — Inpatient Hospital Stay (HOSPITAL_COMMUNITY): Payer: Medicare Other | Admitting: Speech Pathology

## 2015-08-18 ENCOUNTER — Inpatient Hospital Stay (HOSPITAL_COMMUNITY): Payer: Medicare Other | Admitting: Physical Therapy

## 2015-08-18 LAB — BASIC METABOLIC PANEL
Anion gap: 10 (ref 5–15)
BUN: 10 mg/dL (ref 6–20)
CHLORIDE: 101 mmol/L (ref 101–111)
CO2: 26 mmol/L (ref 22–32)
CREATININE: 0.61 mg/dL (ref 0.44–1.00)
Calcium: 9.3 mg/dL (ref 8.9–10.3)
GFR calc non Af Amer: >60 mL/min (ref >60)
Glucose, Bld: 99 mg/dL (ref 65–99)
POTASSIUM: 3.9 mmol/L (ref 3.5–5.1)
SODIUM: 137 mmol/L (ref 135–145)

## 2015-08-18 LAB — GLUCOSE, CAPILLARY
GLUCOSE-CAPILLARY: 118 mg/dL — AB (ref 65–99)
GLUCOSE-CAPILLARY: 119 mg/dL — AB (ref 65–99)
GLUCOSE-CAPILLARY: 73 mg/dL (ref 65–99)
GLUCOSE-CAPILLARY: 87 mg/dL (ref 65–99)
Glucose-Capillary: 91 mg/dL (ref 65–99)

## 2015-08-18 NOTE — Progress Notes (Signed)
Physical Therapy Session Note  Patient Details  Name: [name] MRN: [id number] Date of Birth: Dec 28, 1940  Today's Date: 08/18/2015 PT Individual Time: 1300-1310 PT Individual Time Calculation (min): 10 min   Short Term Goals: Week 1:  PT Short Term Goal 1 (Week 1): Patient will maintain arousal for 60 min consistently with min multimodal cues. PT Short Term Goal 2 (Week 1): Patient will follow basic 1 step commands in 75% of available opportunities.  PT Short Term Goal 3 (Week 1): Patient will maintain sitting balance x 5 min with supervision.  PT Short Term Goal 4 (Week 1): Patient will initiate ambulation.   Skilled Therapeutic Interventions/Progress Updates:  Session focused on arousal and active participation in therapeutic activities. Upon arrival, patient asleep in bed. Patient did not open her eyes despite max multimodal cues, environmental modifications, noxious stimuli, and repositioning head to midline with no change in facial expression noted. Patient left semi reclined in bed, RN and treatment team aware.   Therapy Documentation Precautions:  Precautions Precautions: Fall Precaution Comments: R hemiplegia, R neglect, nonverbal Restrictions Weight Bearing Restrictions: No General: PT Amount of Missed Time (min): 50 Minutes PT Missed Treatment Reason: Patient fatigue;Other (Comment) (decreased arousal) Pain: Pain Assessment Pain Assessment: Faces Faces Pain Scale: No hurt   See Function Navigator for Current Functional Status.   Therapy/Group: Individual Therapy  [name] 08/18/2015, 2:20 PM

## 2015-08-18 NOTE — Progress Notes (Signed)
Occupational Therapy Session Note  Patient Details  Name: [name] MRN: [id number] Date of Birth: 1940/11/18  Today's Date: 08/18/2015 OT Individual Time: [id number] OT Individual Time Calculation (min): 20 min  and Today's Date: 08/18/2015 OT Missed Time: 71 Minutes Missed Time Reason: Patient fatigue   Short Term Goals: Week 1:  OT Short Term Goal 1 (Week 1): Pt will participate in self care task for 2 minutes  in order to decrease level of assist with self care.  OT Short Term Goal 2 (Week 1): Pt will locate 3 items to the R during self care tasks by pointing to them in order to demonstrate visual scanning with mod cues. OT Short Term Goal 3 (Week 1): Pt will perform rolls to the left and right with max A or less during LB clothing management and hygiene n order to decrease level of care.  Skilled Therapeutic Interventions/Progress Updates:    Pt asleep in bed upon arrival.  Focus on arousal and active participation in skilled OT tasks.  Attempted to arouse patient to actively participate.  Unable to engage patient in tasks.  Pt did not open eyes or respond to tactile cues including sternal rub.  Pt remained in bed with all needs within reach.   Therapy Documentation Precautions:  Precautions Precautions: Fall Precaution Comments: R hemiplegia, R neglect, nonverbal Restrictions Weight Bearing Restrictions: No General: General OT Amount of Missed Time: 55 Minutes  Pain:  Pt with no s/s pain   See Function Navigator for Current Functional Status.   Therapy/Group: Individual Therapy  [name] 08/18/2015, 10:15 AM

## 2015-08-18 NOTE — Progress Notes (Signed)
Pam Love gave an order for left wrist restraint. Kennieth Francois, RN

## 2015-08-18 NOTE — Plan of Care (Signed)
[name] plan of care adjusted to 15/7 after speaking with treatment team and discussed with MD as pt currently unable to tolerate current therapy schedule with OT, PT, and SLP.

## 2015-08-18 NOTE — Progress Notes (Signed)
[place] PHYSICAL MEDICINE & REHABILITATION     PROGRESS NOTE    Subjective/Complaints: Pt pulled out NGT again. Arousal fluctuates.       ROS limited due to cognitive status  Objective: Vital Signs: Blood pressure 142/77, pulse 65, temperature 98.2 F (36.8 C), temperature source Oral, resp. rate 17, weight 63.9 kg (140 lb 14 oz), SpO2 97 %. Dg Abd Portable 1v  08/16/2015  CLINICAL DATA:  Encounter for feeding tube placement. EXAM: PORTABLE ABDOMEN - 1 VIEW COMPARISON:  August 02, 2015. FINDINGS: The bowel gas pattern is normal. Distal tip of feeding tube is seen in expected position of distal stomach or proximal duodenum. No definite calculi are noted. IMPRESSION: Distal tip of feeding tube seen in expected position of distal stomach or proximal duodenum. Electronically Signed   By: Marijo Conception, M.D.   On: 08/16/2015 16:10   No results for input(s): WBC, HGB, HCT, PLT in the last 72 hours.  Recent Labs  08/18/15 0545  NA 137  K 3.9  CL 101  GLUCOSE 99  BUN 10  CREATININE 0.61  CALCIUM 9.3   CBG (last 3)   Recent Labs  08/17/15 2020 08/18/15 0025 08/18/15 0412  GLUCAP 93 73 87    Wt Readings from Last 3 Encounters:  08/18/15 63.9 kg (140 lb 14 oz)  08/05/15 64.5 kg (142 lb 3.2 oz)  06/14/11 64.501 kg (142 lb 3.2 oz)    Physical Exam:  Constitutional: She appears well-developed, well nourished. NAD. HENT:  Head: Normocephalic. Atraumatic.  Eyes:  Pupils reactive to light  Neck: Normal range of motion. Neck supple. No thyromegaly present.  Cardiovascular: Normal rate and regular rhythm. + systolic Murmur Respiratory: Effort normal and breath sounds normal. No respiratory distress. no wheezes or rales GI: Soft. Bowel sounds are normal. She exhibits no distension.  Neurological:  Unable to awaken her this morning. Is moving left leg spontaneously DTRs 3+ right upper and right lower extremity Unable to assess sensation except does not withdraw to  pinch in RUE and RLE. Motor:  Right upper extremity and right lower extremity  0/5 Left upper and left lower extremity ~ 3+/5 or more (patient not participating in MMT) + Babinski bilaterally.  Flexor tone RUE Skin: Skin is warm and dry.   Assessment/Plan: 1. 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 Physiatrist is providing close team supervision and 24 hour management of active medical problems listed below. Physiatrist and rehab team continue to assess barriers to discharge/monitor patient progress toward functional and medical goals.  Function:  Bathing Bathing position   Position: Bed  Bathing parts Body parts bathed by patient: Left upper leg, Abdomen Body parts bathed by helper: Right arm, Left arm, Chest, Front perineal area, Right upper leg, Right lower leg, Left lower leg, Back  Bathing assist Assist Level:  (Total assist)      Upper Body Dressing/Undressing Upper body dressing   What is the patient wearing?: Pull over shirt/dress     Pull over shirt/dress - Perfomed by patient: Thread/unthread left sleeve Pull over shirt/dress - Perfomed by helper: Thread/unthread right sleeve, Put head through opening, Pull shirt over trunk        Upper body assist Assist Level:  (Max Assist)      Lower Body Dressing/Undressing Lower body dressing Lower body dressing/undressing activity did not occur: N/A What is the patient wearing?:  Pants, Non-skid slipper socks       Pants- Performed by helper: Thread/unthread right pants leg, Thread/unthread left pants leg, Pull pants up/down, Fasten/unfasten pants   Non-skid slipper socks- Performed by helper: Don/doff right sock, Don/doff left sock   Socks - Performed by helper: Don/doff right sock, Don/doff left sock   Shoes - Performed by helper: Don/doff right shoe, Don/doff left shoe, Fasten  right, Fasten left       TED Hose - Performed by helper: Don/doff right TED hose, Don/doff left TED hose  Lower body assist Assist for lower body dressing:  (Total assist)      Toileting Toileting Toileting activity did not occur: Safety/medical concerns        Toileting assist Assist level: More than reasonable time, Two helpers   Transfers Chair/bed transfer   Chair/bed transfer method: Squat pivot Chair/bed transfer assist level: Total assist (Pt < 25%) Chair/bed transfer assistive device: Mechanical lift Mechanical lift:  (no +2 available)   Locomotion Ambulation Ambulation activity did not occur: Safety/medical concerns         Wheelchair   Type: Manual (TIS wheelchair)   Assist Level: Dependent (Pt equals 0%)  Cognition Comprehension Comprehension assist level: Understands basic less than 25% of the time/ requires cueing >75% of the time  Expression Expression assist level: Expresses basis less than 25% of the time/requires cueing >75% of the time.  Social Interaction Social Interaction assist level: Interacts appropriately less than 25% of the time. May be withdrawn or combative.  Problem Solving Problem solving assist level: Solves basic less than 25% of the time - needs direction nearly all the time or does not effectively solve problems and may need a restraint for safety  Memory Memory assist level: Recognizes or recalls less than 25% of the time/requires cueing greater than 75% of the time   Medical Problem List and Plan: 1. 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 Arousal continues to fluctuate 2. DVT Prophylaxis/Anticoagulation: Subcutaneous heparin initiated 08/09/2015 3. Pain Management: Hydrocodone as needed 4. Dysphagia. Dysphagia #2 honey thick liquids.  -continue IV fluids for hydration--changee to hs only to make therapy easier    -NGT==replace  -left wrist restraint required to keep NGT in place 5. Neuropsych: This patient is not capable of making decisions on her own behalf. 6. Skin/Wound Care: Routine skin checks,    7. Decreased nutritional storage. Dietary follow-up. Check calorie counts  -re-place NGT today 8. Fluids/Electrolytes/Nutrition: following lytes, supplementing PO with NGT , poor po intake  -continue replete potassium  -  9. Seizure prophylaxis. Keppra held for somnolence EEG neg, Hold per neuro 10. Leukocytosis. Follow-up urine study/follow-up CBC 11. Hypertension. Cozaar 100 mg daily. Monitor with increased mobility 12. Hyperlipidemia. Lipitor 13. Lethargy post stroke:improved on ritalin and remains alert, some mild agitation/irritability last noc pulled tube again and refused KCL  -continue ritalin  -keppra stopped by neurology,  -appreciate neuro help 14. Low grade temp resolved  -ua unremarkable. -given defervescence will hold on cx  -CXR clear 15. Spasticity: PRAFO RLE  -continue ROM/modalities  -  16. Loose stool: likely from TF formula  -observe if we reinitiate TF  -continue probiotic. Added fiber   LOS (Days) 7 A FACE TO FACE EVALUATION WAS PERFORMED  Cahterine Heinzel T 08/18/2015 9:05 AM

## 2015-08-18 NOTE — Progress Notes (Signed)
Speech Language Pathology Daily Session Note  Patient Details  Name: [name] MRN: [id number] Date of Birth: [birth year]  Today's Date: 08/18/2015 SLP Individual Time: [id number] SLP Individual Time Calculation (min): 30 min  Missed Treatment Time: 30 min, fatigue   Short Term Goals: Week 1: SLP Short Term Goal 1 (Week 1): Patient will maintain arousal in regards to keeping her eyes open for ~60 seconds with Max A multimodal cues.  SLP Short Term Goal 2 (Week 1): Patient will follow basic 1 step commands in 50% of opporunities with Max A multimodal cues.  SLP Short Term Goal 3 (Week 1): Patient will answer basic yes/no questions utilizing multimodal communication with 50% accuracy with Max A multimodal cues.  SLP Short Term Goal 4 (Week 1): Patient will consume current diet without overt s/s of aspiration with Mod A verbal cues for use of swallowing compensatory strategies.   Skilled Therapeutic Interventions: Skilled treatment session focused on cognitive goals. Upon arrival, patient was asleep while supine in bed. Patient did not open her eyes despite Max A verbal and tactile cues, environmental modifications, noxious stimuli by clinician, physician and RN and multiple attempts. Treatment team is aware. Patient left asleep while upright in bed with lights on. Continue with current plan of care.    Function:  Cognition Comprehension Comprehension assist level: Understands basic less than 25% of the time/ requires cueing >75% of the time  Expression   Expression assist level: Expresses basis less than 25% of the time/requires cueing >75% of the time.  Social Interaction Social Interaction assist level: Interacts appropriately less than 25% of the time. May be withdrawn or combative.  Problem Solving Problem solving assist level: Solves basic less than 25% of the time - needs direction nearly all the time or does not effectively solve problems and may need a restraint for safety  Memory  Memory assist level: Recognizes or recalls less than 25% of the time/requires cueing greater than 75% of the time    Pain No s/s of pain   Therapy/Group: Individual Therapy  Raveen Wieseler 08/18/2015, 8:44 AM

## 2015-08-18 NOTE — Plan of Care (Signed)
Problem: RH BOWEL ELIMINATION Goal: RH STG MANAGE BOWEL WITH ASSISTANCE STG Manage Bowel with max Assistance.  Outcome: Not Progressing No bm since 4/28

## 2015-08-19 ENCOUNTER — Ambulatory Visit (HOSPITAL_COMMUNITY): Payer: Medicare Other | Admitting: Occupational Therapy

## 2015-08-19 ENCOUNTER — Inpatient Hospital Stay (HOSPITAL_COMMUNITY): Payer: Medicare Other | Admitting: Speech Pathology

## 2015-08-19 ENCOUNTER — Inpatient Hospital Stay (HOSPITAL_COMMUNITY): Payer: Medicare Other | Admitting: Occupational Therapy

## 2015-08-19 ENCOUNTER — Inpatient Hospital Stay (HOSPITAL_COMMUNITY): Payer: Medicare Other | Admitting: Physical Therapy

## 2015-08-19 LAB — GLUCOSE, CAPILLARY
GLUCOSE-CAPILLARY: 109 mg/dL — AB (ref 65–99)
GLUCOSE-CAPILLARY: 121 mg/dL — AB (ref 65–99)
Glucose-Capillary: 121 mg/dL — ABNORMAL HIGH (ref 65–99)
Glucose-Capillary: 147 mg/dL — ABNORMAL HIGH (ref 65–99)
Glucose-Capillary: 117 mg/dL — ABNORMAL HIGH (ref 65–99)
Glucose-Capillary: 115 mg/dL — ABNORMAL HIGH (ref 65–99)

## 2015-08-19 MED ORDER — JEVITY 1.5 CAL/FIBER PO LIQD
1000.0000 mL | ORAL | Status: DC
  Administered 2015-08-19: 1000 mL
  Filled 2015-08-19 (×11): qty 1000

## 2015-08-19 MED ORDER — METHYLPHENIDATE HCL 5 MG PO TABS
10.0000 mg | ORAL_TABLET | Freq: Two times a day (BID) | ORAL | Status: DC
  Administered 2015-08-19 – 2015-09-12 (×44): 10 mg via NASOGASTRIC
  Filled 2015-08-19 (×50): qty 2

## 2015-08-19 MED ORDER — CHOLESTYRAMINE 4 G PO PACK
4.0000 g | PACK | Freq: Two times a day (BID) | ORAL | Status: DC
  Administered 2015-08-20: 4 g
  Filled 2015-08-19 (×4): qty 1

## 2015-08-19 NOTE — Progress Notes (Signed)
Physical Therapy Weekly Progress Note  Patient Details  Name: [name] MRN: [id number] Date of Birth: 01/27/1941  Beginning of progress report period: August 13, 2015 End of progress report period: Aug 19, 2015  Today's Date: 08/19/2015 PT Individual Time: 0900-0930 PT Individual Time Calculation (min): 30 min   Patient has met 0 of 4 short term goals. Patient's progress has been inconsistent and minimal this week due to fluctuating levels of arousal. Patient with inconsistent static sitting balance with brief periods of supervision when alert and max to total A x 2 for bed mobility and transfers.  Patient continues to demonstrate the following deficits: R hemiplegia, muscle weakness, decreased endurance, impaired timing and sequencing, abnormal tone, unbalanced muscle activation, apraxia, decreased coordination, decreased midline orientation, decreased attention to right and/or right side neglect, decreased initiation, decreased attention, decreased awareness, decreased problem solving, decreased safety awareness, decreased memory, delayed processing, decreased sitting balance, decreased standing balance, decreased postural control,and decreased balance strategies and therefore will continue to benefit from skilled PT intervention to enhance overall performance with activity tolerance, balance, postural control, ability to compensate for deficits, functional use of  right upper extremity, right lower extremity, left upper extremity and left lower extremity, attention, awareness and coordination.  Patient not progressing toward long term goals. Will re-assess need to revise goals at next progress report.  Continue plan of care.  PT Short Term Goals Week 1:  PT Short Term Goal 1 (Week 1): Patient will maintain arousal for 60 min consistently with min multimodal cues. PT Short Term Goal 1 - Progress (Week 1): Not met PT Short Term Goal 2 (Week 1): Patient will follow basic 1 step commands in  75% of available opportunities.  PT Short Term Goal 2 - Progress (Week 1): Not met PT Short Term Goal 3 (Week 1): Patient will maintain sitting balance x 5 min with supervision.  PT Short Term Goal 3 - Progress (Week 1): Not met PT Short Term Goal 4 (Week 1): Patient will initiate ambulation.  PT Short Term Goal 4 - Progress (Week 1): Not met Week 2:  PT Short Term Goal 1 (Week 2): Patient will maintain arousal for 30 min consistently with min multimodal cues. PT Short Term Goal 2 (Week 2): Patient will follow basic 1 step commands in 75% of available opportunities.  PT Short Term Goal 3 (Week 2): Patient will maintain sitting balance x 5 min with supervision.  PT Short Term Goal 4 (Week 2): Patient will stand with use of equipment x 5 min.   Skilled Therapeutic Interventions/Progress Updates:    Session focused on arousal and active participation in therapeutic activities. Upon arrival, patient asleep in bed. Patient did not open her eyes despite max multimodal cues, environmental modifications, noxious stimuli, and PROM cervical rotation. Patient rolled to L and R in bed to place [facility] sling and transferred from bed to [place] wheelchair with +2A via [employer]. Patient with no change in alertness when placed in wheelchair or when transported in wheelchair around rehab unit. Patient left up in wheelchair with L mitt donned and husband and RN at side.   Therapy Documentation Precautions:  Precautions Precautions: Fall Precaution Comments: R hemiplegia, R neglect, nonverbal Restrictions Weight Bearing Restrictions: No General: PT Amount of Missed Time (min): 30 Minutes PT Missed Treatment Reason: Patient fatigue;Other (Comment) (decreased arousal) Pain: Pain Assessment Pain Assessment: Faces Faces Pain Scale: No hurt  See Function Navigator for Current Functional Status.  Therapy/Group: Individual Therapy  [name]  08/19/2015, 9:48 AM

## 2015-08-19 NOTE — Progress Notes (Signed)
Patient has frequent visitors, need education about keeping HOB above 30 degrees. Family lowered HOB during visit.

## 2015-08-19 NOTE — Progress Notes (Addendum)
Speech Language Pathology Weekly Progress and Session Note  Patient Details  Name: [name] MRN: [id number] Date of Birth: 12/06/1940  Beginning of progress report period: August 12, 2015 End of progress report period: Aug 19, 2015  Today's Date: 08/19/2015 SLP Co-Treatment Time: 1300-1400 SLP Co-Treatment Time Calculation (min): 60 min  Short Term Goals: Week 1: SLP Short Term Goal 1 (Week 1): Patient will maintain arousal in regards to keeping her eyes open for ~60 seconds with Max A multimodal cues.  SLP Short Term Goal 1 - Progress (Week 1): Met SLP Short Term Goal 2 (Week 1): Patient will follow basic 1 step commands in 50% of opporunities with Max A multimodal cues.  SLP Short Term Goal 2 - Progress (Week 1): Met SLP Short Term Goal 3 (Week 1): Patient will answer basic yes/no questions utilizing multimodal communication with 50% accuracy with Max A multimodal cues.  SLP Short Term Goal 3 - Progress (Week 1): Met SLP Short Term Goal 4 (Week 1): Patient will consume current diet without overt s/s of aspiration with Mod A verbal cues for use of swallowing compensatory strategies.  SLP Short Term Goal 4 - Progress (Week 1): Not met    New Short Term Goals: Week 2: SLP Short Term Goal 1 (Week 2): Patient will consume current diet without overt s/s of aspiration with Mod A verbal cues for use of swallowing compensatory strategies.  SLP Short Term Goal 2 (Week 2): Patient will answer basic yes/no questions utilizing multimodal communication with 75% accuracy with Max A multimodal cues.  SLP Short Term Goal 3 (Week 2): Patient will follow basic 1 step commands in 75% of opporunities with Mod A multimodal cues.  SLP Short Term Goal 4 (Week 2): Patient will maintain arousal in regards to keeping her eyes open for 30 minutes with Max A multimodal cues over 2 consecutive sessions.  SLP Short Term Goal 5 (Week 2): Patient will vocalize with Max A multimodal cues in 20% of opportunities   SLP Short Term Goal 6 (Week 2): Patient will name functional items at the word level with Max a multimodal cues in 20% of opportunities.   Weekly Progress Updates: Patient has made inconsistent but functional gains and has met 3 of 4 STG's inconsistently this reporting period. Currently, patient continues to be limited by her inconsistent arousal, however, when patient is alert, she can attend to tasks with extra time and overall Min A multimodal cues. Patient is consuming limited amounts of Dys. 1 textures with honey-thick liquids without overt s/s of aspiration and requires Mod A verbal cues for use of swallowing compensatory strategies. Patient continues to require overall total A for expression of wants/needs and is essentially nonverbal and can follow 1 step commands with overall Mod A verbal and visual cues.  Patient and family education is ongoing. Patient would benefit from continued skilled SLP intervention to maximize cognitive-linguistic and swallowing function prior to discharge home.    Intensity: Minumum of 1-2 x/day, 30 to 90 minutes Frequency: 3 to 5 out of 7 days Duration/Length of Stay: 3-4 weeks  Treatment/Interventions: Cueing hierarchy;Functional tasks;Patient/family education;Environmental controls;Internal/external aids;Dysphagia/aspiration precaution training;Therapeutic Activities;Cognitive remediation/compensation;Speech/Language facilitation;Multimodal communication approach   Daily Session  Skilled Therapeutic Interventions: Skilled treatment session focused on arousal and cognitive-linguistic goals. Upon arrival, patient was awake while sitting upright in the wheelchair. Patient was transferred to the mat with +1 assist and sat edge of mat for ~45 minutes while completing cognitive-linguistic tasks and kept her eyes open throughout  the entire session. SLP facilitated session by identifying functional items from a field of 3 with 100% accuracy and extra time, answered basic  yes/no questions in regards to biographical and environmental information with written aids with 50% accuracy with tendency to perseverate on the written aid that was in her left field of enviornment. Patient wrote her first and middle name with Mod I and required total A to write her last name with her non-dominant hand. Patient also approximated ~75% familiar songs and automatic seqencies (happy birthday, counting, etc) with Mod A verbal and visual cues and vocalized intermittently X 3 with Max A multimodal cues. Patient also verbalized her name and the word "bye" with Max A multimodal cues with verbal perseveration on the word "what." Patient left upright in wheelchair at RN station. Continue with current plan of care.   Function:   Cognition Comprehension Comprehension assist level: Understands basic 25 - 49% of the time/ requires cueing 50 - 75% of the time  Expression   Expression assist level: Expresses basic 25 - 49% of the time/requires cueing 50 - 75% of the time. Uses single words/gestures.  Social Interaction Social Interaction assist level: Interacts appropriately less than 25% of the time. May be withdrawn or combative.  Problem Solving Problem solving assist level: Solves basic less than 25% of the time - needs direction nearly all the time or does not effectively solve problems and may need a restraint for safety  Memory Memory assist level: Recognizes or recalls less than 25% of the time/requires cueing greater than 75% of the time   Pain Pain Assessment Pain Assessment: Faces Faces Pain Scale: No hurt  Therapy/Group: Individual Therapy  Tierre Netto 08/19/2015, 4:23 PM

## 2015-08-19 NOTE — Progress Notes (Signed)
Speech Language Pathology Daily Session Note  Patient Details  Name: [name] MRN: [id number] Date of Birth: September 23, 1940  Today's Date: 08/19/2015 SLP Individual Time: 0800-0830 SLP Individual Time Calculation (min): 30 min  Missed Time: 30 mins  Short Term Goals: Week 1: SLP Short Term Goal 1 (Week 1): Patient will maintain arousal in regards to keeping her eyes open for ~60 seconds with Max A multimodal cues.  SLP Short Term Goal 2 (Week 1): Patient will follow basic 1 step commands in 50% of opporunities with Max A multimodal cues.  SLP Short Term Goal 3 (Week 1): Patient will answer basic yes/no questions utilizing multimodal communication with 50% accuracy with Max A multimodal cues.  SLP Short Term Goal 4 (Week 1): Patient will consume current diet without overt s/s of aspiration with Mod A verbal cues for use of swallowing compensatory strategies.   Skilled Therapeutic Interventions: Skilled treatment session focused on cognitive goals. Upon arrival, patient was asleep in bed and did not open her eyes despite Max A multimodal cues. Patient was incontinent of bowel and required total A + 2 for bed mobility and self-care. Despite ~20 minutes of self-care and constant repositioning, etc., patient did not open her eyes or follow any directions, therefore, patient missed remaining 30 minutes of session. Patient left supine in bed with all needs within reach. Continue with current plan of care.    Function:   Cognition Comprehension Comprehension assist level: Understands basic less than 25% of the time/ requires cueing >75% of the time  Expression   Expression assist level: Expresses basis less than 25% of the time/requires cueing >75% of the time.  Social Interaction Social Interaction assist level: Interacts appropriately less than 25% of the time. May be withdrawn or combative.  Problem Solving Problem solving assist level: Solves basic less than 25% of the time - needs direction  nearly all the time or does not effectively solve problems and may need a restraint for safety  Memory Memory assist level: Recognizes or recalls less than 25% of the time/requires cueing greater than 75% of the time    Pain No s/s of pain   Therapy/Group: Individual Therapy  Aveion Nguyen 08/19/2015, 3:56 PM

## 2015-08-19 NOTE — Progress Notes (Signed)
Nutrition Follow-up  DOCUMENTATION CODES:   Severe malnutrition in context of acute illness/injury  INTERVENTION:  Discontinue Vital 1.5 formula.   Initiate Jevity 1.5 formula at 25 ml/hr via Cortrak NGT and increase by 10 ml every 4 hours to goal rate of 55 ml/hr x 20 hours (may hold for up to 4 hours for therapy) to provide 1650 kcal (100% of needs), 70 grams of protein, and 836 ml of free water.   Once IV fluids are discontinued, recommend free water flushes of 200 ml TID.  RD to continue to monitor.   NUTRITION DIAGNOSIS:   Malnutrition related to acute illness as evidenced by energy intake < or equal to 50% for > or equal to 5 days, moderate depletions of muscle mass; ongoing  GOAL:   Patient will meet greater than or equal to 90% of their needs; met via TF  MONITOR:   PO intake, Diet advancement, TF tolerance, Weight trends, Labs, I & O's  REASON FOR ASSESSMENT:   Consult Assessment of nutrition requirement/status  ASSESSMENT:   75 y.o. right handed female with history of hypertension. 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  Pt with no PO intake today. Per RN pt was lethargic this AM, however mentation has improved. Hopeful pt will take some po at next meal. RD contacted via RN regarding large loose stools while pt is on Vital 1.5 formula. RN reports stools were better on the Jevity formula and pt was able to tolerate it better. RD to modify tube feeds. RD to continue to monitor.   Labs and medications reviewed.   Diet Order:  DIET - DYS 1 Room service appropriate?: Yes; Fluid consistency:: Honey Thick  Skin:  Reviewed, no issues  Last BM:  5/2-loose  Height:   Ht Readings from Last 1 Encounters:  08/01/15 5' 4"  (1.626 m)    Weight:   Wt Readings from Last 1 Encounters:  08/19/15 135 lb 9.3 oz (61.5 kg)    Ideal Body Weight:  54.5 kg  BMI:  Body  mass index is 23.26 kg/(m^2).  Estimated Nutritional Needs:   Kcal:  1650-1850  Protein:  70-85 grams  Fluid:  1.6 - 1.8 L/day  EDUCATION NEEDS:   No education needs identified at this time  Corrin Parker, MS, RD, LDN Pager # [phone] After hours/ weekend pager # [phone]

## 2015-08-19 NOTE — Progress Notes (Signed)
Speech Language Pathology Daily Session Note  Patient Details  Name: [name] MRN: [id number] Date of Birth: Apr 19, 1941  Today's Date: 08/19/2015 SLP Individual Time: 1500-1530 SLP Individual Time Calculation (min): 30 min  Short Term Goals: Week 1: SLP Short Term Goal 1 (Week 1): Patient will maintain arousal in regards to keeping her eyes open for ~60 seconds with Max A multimodal cues.  SLP Short Term Goal 2 (Week 1): Patient will follow basic 1 step commands in 50% of opporunities with Max A multimodal cues.  SLP Short Term Goal 3 (Week 1): Patient will answer basic yes/no questions utilizing multimodal communication with 50% accuracy with Max A multimodal cues.  SLP Short Term Goal 4 (Week 1): Patient will consume current diet without overt s/s of aspiration with Mod A verbal cues for use of swallowing compensatory strategies.   Skilled Therapeutic Interventions:  Pt was seen for skilled ST targeting communication goals.  Pt was awake upon arrival and responsive to therapist.  Pt was able to identify objects when named from a field of 2 for >50% accuracy with max assist verbal and visual cues to recognize and correct errors.  Pt was also able to initiate intermittent voicing when singing basic, familiar songs with max assist verbal and visual cues for initiation and phonemic placement.  Phonemic paraphasic errors and perseveration noted during vocalization.  Pt also was able to count from 1-10 for 60% accuracy with mod assist verbal cues.  Pt exhibited greater difficulty when reciting days of the week and was only able to name 3-4 out of 7 despite max assist.  Pt was left in wheelchair with family at bedside.  Continue per current plan of care.    Function:  Eating Eating                 Cognition Comprehension Comprehension assist level: Understands basic 25 - 49% of the time/ requires cueing 50 - 75% of the time  Expression   Expression assist level: Expresses basic 25 -  49% of the time/requires cueing 50 - 75% of the time. Uses single words/gestures.  Social Interaction Social Interaction assist level: Interacts appropriately less than 25% of the time. May be withdrawn or combative.  Problem Solving Problem solving assist level: Solves basic less than 25% of the time - needs direction nearly all the time or does not effectively solve problems and may need a restraint for safety  Memory Memory assist level: Recognizes or recalls less than 25% of the time/requires cueing greater than 75% of the time    Pain Pain Assessment Pain Assessment: Faces Faces Pain Scale: No hurt  Therapy/Group: Individual Therapy  Adelaido Nicklaus, Selinda Orion 08/19/2015, 4:08 PM

## 2015-08-19 NOTE — Progress Notes (Signed)
Occupational Therapy Weekly Progress Note  Patient Details  Name: [name] MRN: [id number] Date of Birth: 1941/04/04  Beginning of progress report period: August 12, 2015 End of progress report period: Aug 19, 2015  Patient has met 0 of 3 short term goals.  [name] progress has been minimal during this past week.  Pt exhibits extended periods of decreased arousal with limited periods of arousal.  Pt has inconsistently exhibited static sitting balance at supervision level when alert.  Pt requires max demonstration cues to initiate tasks.  Pt has inconsistently performed squat pivot transfers with tot A + 2 (pt-50%). Patient continues to demonstrate the following deficits: muscle weakness, decreased cardiorespiratoy endurance, impaired timing and sequencing, abnormal tone, unbalanced muscle activation, motor apraxia, decreased coordination and decreased motor planning, decreased visual perceptual skills, decreased midline orientation, decreased attention to right, right side neglect, decreased motor planning and ideational apraxia, decreased initiation, decreased attention, decreased awareness, decreased problem solving, decreased safety awareness, decreased memory and delayed processing and decreased sitting balance, decreased standing balance, decreased postural control, hemiplegia, decreased balance strategies and difficulty maintaining precautions and therefore will continue to benefit from skilled OT intervention to enhance overall performance with BADL and Reduce care partner burden.  Patient progressing toward long term goals..  Continue plan of care.  OT Short Term Goals Week 1:  OT Short Term Goal 1 (Week 1): Pt will participate in self care task for 2 minutes  in order to decrease level of assist with self care.  OT Short Term Goal 1 - Progress (Week 1): Progressing toward goal OT Short Term Goal 2 (Week 1): Pt will locate 3 items to the R during self care tasks by pointing to them in  order to demonstrate visual scanning with mod cues. OT Short Term Goal 2 - Progress (Week 1): Progressing toward goal OT Short Term Goal 3 (Week 1): Pt will perform rolls to the left and right with max A or less during LB clothing management and hygiene n order to decrease level of care. OT Short Term Goal 3 - Progress (Week 1): Progressing toward goal Week 2:  OT Short Term Goal 1 (Week 2): Pt will participate in self care task for 2 minutes in order to decrease level of assist with self care. OT Short Term Goal 2 (Week 2): Pt will locate 3 items to the R during self care tasks by pointing to them in order to demonstrate visual scanning with mod cues OT Short Term Goal 3 (Week 2): Pt will perform rolls to the left and right with max A or less during LB clothing management and hygiene in order to decrease burden of care      Therapy Documentation Precautions:  Precautions Precautions: Fall Precaution Comments: R hemiplegia, R neglect, nonverbal Restrictions Weight Bearing Restrictions: No  See Function Navigator for Current Functional Status.    Leotis Shames [facility] 08/19/2015, 7:17 AM

## 2015-08-19 NOTE — Progress Notes (Signed)
Occupational Therapy Session Note  Patient Details  Name: [name] MRN: [id number] Date of Birth: [birth year]  Today's Date: 08/19/2015 OT Individual Time: 1100-1200 OT Individual Time Calculation (min): 60 min    Short Term Goals: Week 1:  OT Short Term Goal 1 (Week 1): Pt will participate in self care task for 2 minutes  in order to decrease level of assist with self care.  OT Short Term Goal 1 - Progress (Week 1): Progressing toward goal OT Short Term Goal 2 (Week 1): Pt will locate 3 items to the R during self care tasks by pointing to them in order to demonstrate visual scanning with mod cues. OT Short Term Goal 2 - Progress (Week 1): Progressing toward goal OT Short Term Goal 3 (Week 1): Pt will perform rolls to the left and right with max A or less during LB clothing management and hygiene n order to decrease level of care. OT Short Term Goal 3 - Progress (Week 1): Progressing toward goal Week 2:  OT Short Term Goal 1 (Week 2): Pt will participate in self care task for 2 minutes in order to decrease level of assist with self care. OT Short Term Goal 2 (Week 2): Pt will locate 3 items to the R during self care tasks by pointing to them in order to demonstrate visual scanning with mod cues OT Short Term Goal 3 (Week 2): Pt will perform rolls to the left and right with max A or less during LB clothing management and hygiene in order to decrease burden of care  Skilled Therapeutic Interventions/Progress Updates:    Pt resting in w/c upon arrival with eyes closed.  Pt responded to verbal cues and opened eyes.  Pt transitioned to therapy gym and transferred to mat with Beazy board (tot A + 2).  Pt was able to maintain static sitting balance with close supervision and occasional steady A.  Pt initially engaged in [place] including weight bearing while reaching to retrieve items with her LUE and reaching across midline to place items on table.  Pt required max demonstration cues to initiate  and complete tasks.  Pt noted with ideomotor and ideational apraxia when requested to complete tasks.  Pt able to repeat tasks on command.  Pt noted with significant R inattention especially during dressing tasks.  Pt did not initiate threading RUE or RLE during dressing tasks.  Pt required tot A + 2 for sit<>stand and standing to pull up pants.  Pt performed squat pivot transfer to w/c with tot A + 2 (pt=50%) and returned to room to use toilet.  Pt performed w/c<>toilet transfers with tot A + 2 (pt=50%) and initiated pulling up pants when standing.  Pt remained in w/c with mit and wrist restraints in place on LUE.  Pt positioned in w/c at nurses station.   Therapy Documentation Precautions:  Precautions Precautions: Fall Precaution Comments: R hemiplegia, R neglect, nonverbal Restrictions Weight Bearing Restrictions: No General: General PT Missed Treatment Reason: Patient fatigue;Other (Comment) (decreased arousal)   Pain: Pain Assessment Pain Assessment: Faces Faces Pain Scale: No hurt  See Function Navigator for Current Functional Status.   Therapy/Group: Individual Therapy  [name] 08/19/2015, 12:03 PM

## 2015-08-19 NOTE — Progress Notes (Signed)
[place] PHYSICAL MEDICINE & REHABILITATION     PROGRESS NOTE    Subjective/Complaints: Pt awake in bed. No issues overnight. NGT still in       ROS limited due to cognitive status  Objective: Vital Signs: Blood pressure 136/74, pulse 67, temperature 97.4 F (36.3 C), temperature source Axillary, resp. rate 17, weight 61.5 kg (135 lb 9.3 oz), SpO2 99 %. Dg Abd Portable 1v  08/18/2015  CLINICAL DATA:  Feeding tube placement. EXAM: PORTABLE ABDOMEN - 1 VIEW COMPARISON:  08/16/2015. FINDINGS: Feeding tube terminates in the distal duodenum, approaching the ligament of Treitz. Bowel gas pattern is unremarkable. Visualized lung bases are clear. IMPRESSION: Feeding tube is in the distal duodenum. Electronically Signed   By: Lorin Picket M.D.   On: 08/18/2015 12:37   No results for input(s): WBC, HGB, HCT, PLT in the last 72 hours.  Recent Labs  08/18/15 0545  NA 137  K 3.9  CL 101  GLUCOSE 99  BUN 10  CREATININE 0.61  CALCIUM 9.3   CBG (last 3)   Recent Labs  08/19/15 0036 08/19/15 0403 08/19/15 0840  GLUCAP 121* 147* 109*    Wt Readings from Last 3 Encounters:  08/19/15 61.5 kg (135 lb 9.3 oz)  08/05/15 64.5 kg (142 lb 3.2 oz)  06/14/11 64.501 kg (142 lb 3.2 oz)    Physical Exam:  Constitutional: She appears well-developed, well nourished. NAD. HENT:  Head: Normocephalic. Atraumatic.  Eyes:  Pupils reactive to light  Neck: Normal range of motion. Neck supple. No thyromegaly present.  Cardiovascular: Normal rate and regular rhythm. + systolic Murmur Respiratory: Effort normal and breath sounds normal. No respiratory distress. no wheezes or rales GI: Soft. Bowel sounds are normal. She exhibits no distension.  Neurological:  Unable to awaken her this morning. Is moving left leg spontaneously DTRs 3+ right upper and right lower extremity Unable to assess sensation except does not withdraw to pinch in RUE and RLE. Motor:  Right upper extremity and  right lower extremity  0/5 Left upper and left lower extremity ~ 3+/5 or more (patient not participating in MMT) + Babinski bilaterally.  Flexor tone RUE Skin: Skin is warm and dry.   Assessment/Plan: 1. 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 Physiatrist is providing close team supervision and 24 hour management of active medical problems listed below. Physiatrist and rehab team continue to assess barriers to discharge/monitor patient progress toward functional and medical goals.  Function:  Bathing Bathing position   Position: Bed  Bathing parts Body parts bathed by patient: Left upper leg, Abdomen Body parts bathed by helper: Right arm, Left arm, Chest, Front perineal area, Right upper leg, Right lower leg, Left lower leg, Back  Bathing assist Assist Level:  (Total assist)      Upper Body Dressing/Undressing Upper body dressing   What is the patient wearing?: Pull over shirt/dress     Pull over shirt/dress - Perfomed by patient: Thread/unthread left sleeve Pull over shirt/dress - Perfomed by helper: Thread/unthread right sleeve, Put head through opening, Pull shirt over trunk        Upper body assist Assist Level:  (Max Assist)      Lower Body Dressing/Undressing Lower body dressing Lower body dressing/undressing activity did not occur: N/A What is the patient wearing?: Pants, Non-skid slipper socks       Pants- Performed by helper: Thread/unthread  right pants leg, Thread/unthread left pants leg, Pull pants up/down, Fasten/unfasten pants   Non-skid slipper socks- Performed by helper: Don/doff right sock, Don/doff left sock   Socks - Performed by helper: Don/doff right sock, Don/doff left sock   Shoes - Performed by helper: Don/doff right shoe, Don/doff left shoe, Fasten right, Fasten left       TED Hose - Performed by  helper: Don/doff right TED hose, Don/doff left TED hose  Lower body assist Assist for lower body dressing:  (Total assist)      Toileting Toileting Toileting activity did not occur: Safety/medical concerns        Toileting assist Assist level: More than reasonable time, Two helpers   Transfers Chair/bed transfer   Chair/bed transfer method: Squat pivot Chair/bed transfer assist level: Total assist (Pt < 25%) Chair/bed transfer assistive device: Mechanical lift Mechanical lift:  (no +2 available)   Locomotion Ambulation Ambulation activity did not occur: Safety/medical concerns         Wheelchair   Type: Manual (TIS wheelchair)   Assist Level: Dependent (Pt equals 0%)  Cognition Comprehension Comprehension assist level: Understands basic less than 25% of the time/ requires cueing >75% of the time  Expression Expression assist level: Expresses basis less than 25% of the time/requires cueing >75% of the time.  Social Interaction Social Interaction assist level: Interacts appropriately less than 25% of the time. May be withdrawn or combative.  Problem Solving Problem solving assist level: Solves basic less than 25% of the time - needs direction nearly all the time or does not effectively solve problems and may need a restraint for safety  Memory Memory assist level: Recognizes or recalls less than 25% of the time/requires cueing greater than 75% of the time   Medical Problem List and Plan: 1. 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 Arousal continues to fluctuate  -team conference today 2. DVT Prophylaxis/Anticoagulation: Subcutaneous heparin initiated 08/09/2015 3. Pain Management: Hydrocodone as needed 4. Dysphagia. Dysphagia #2 honey thick liquids.  -continue IV fluids for hydration-  hs only to make therapy easier   -NGT  -left wrist restraint required to keep NGT in  place 5. Neuropsych: This patient is not capable of making decisions on her own behalf. 6. Skin/Wound Care: Routine skin checks,    7. Decreased nutritional storage. Dietary follow-up. Check calorie counts  -re-placed NGT  8. Fluids/Electrolytes/Nutrition: following lytes, supplementing PO with NGT , poor po intake  -continue replete potassium  -  9. Seizure prophylaxis. Keppra held for somnolence EEG neg, Hold per neuro 10. Leukocytosis. Follow-up urine study/follow-up CBC 11. Hypertension. Cozaar 100 mg daily. Monitor with increased mobility 12. Hyperlipidemia. Lipitor 13. Lethargy post stroke: arousal continues to wax and wane  -continue ritalin, increase to 10mg   -keppra stopped by neurology,  -appreciate neuro help 14. Low grade temp resolved  -ua unremarkable. -given defervescence will hold on cx  -CXR clear 15. Spasticity: PRAFO RLE  -continue ROM/modalities  -  16. Loose stool: likely from TF formula  -Formula adjusted  -continue probiotic. Added fiber     LOS (Days) 8 A FACE TO FACE EVALUATION WAS PERFORMED  SWARTZ,ZACHARY T 08/19/2015 9:06 AM

## 2015-08-19 NOTE — Progress Notes (Signed)
Two large watery bowel movements this morning. Dietician consulted to evaluate current tube feed formula. PA aware.

## 2015-08-20 ENCOUNTER — Inpatient Hospital Stay (HOSPITAL_COMMUNITY): Payer: Medicare Other | Admitting: Speech Pathology

## 2015-08-20 ENCOUNTER — Inpatient Hospital Stay (HOSPITAL_COMMUNITY): Payer: Medicare Other | Admitting: *Deleted

## 2015-08-20 ENCOUNTER — Inpatient Hospital Stay (HOSPITAL_COMMUNITY): Payer: Medicare Other | Admitting: Physical Therapy

## 2015-08-20 LAB — GLUCOSE, CAPILLARY
GLUCOSE-CAPILLARY: 101 mg/dL — AB (ref 65–99)
GLUCOSE-CAPILLARY: 109 mg/dL — AB (ref 65–99)
GLUCOSE-CAPILLARY: 92 mg/dL (ref 65–99)
GLUCOSE-CAPILLARY: 99 mg/dL (ref 65–99)
Glucose-Capillary: 118 mg/dL — ABNORMAL HIGH (ref 65–99)
Glucose-Capillary: 101 mg/dL — ABNORMAL HIGH (ref 65–99)

## 2015-08-20 NOTE — Progress Notes (Signed)
Physical Therapy Session Note  Patient Details  Name: [name] MRN: [id number] Date of Birth: 10-03-1940  Today's Date: 08/20/2015 PT Individual Time: [id number] PT Individual Time Calculation (min): 57 min   Short Term Goals: Week 2:  PT Short Term Goal 1 (Week 2): Patient will maintain arousal for 30 min consistently with min multimodal cues. PT Short Term Goal 2 (Week 2): Patient will follow basic 1 step commands in 75% of available opportunities.  PT Short Term Goal 3 (Week 2): Patient will maintain sitting balance x 5 min with supervision.  PT Short Term Goal 4 (Week 2): Patient will stand with use of equipment x 5 min.   Skilled Therapeutic Interventions/Progress Updates:   Pt received in w/c.  Pt transported to gym total A and therapist performed adjustment of pt's w/c seating system and head rest for optimal pelvic and trunk positioning to facilitate increased head and postural control and activation while in tilt in space w/c.  Following adjustment of seating system pt engaged in [place] in hallway; see below for details.  At end of session pt left in w/c with all items within reach to await SLP session.    Therapy Documentation Precautions:  Precautions Precautions: Fall Precaution Comments: R hemiplegia, R neglect, nonverbal Restrictions Weight Bearing Restrictions: No Pain: Pain Assessment Pain Assessment: Faces Pain Score: 0-No pain Other Treatments: Treatments Neuromuscular Facilitation: Right;Lower Extremity;Forced use;Activity to increase coordination;Activity to increase motor control;Activity to increase timing and sequencing;Activity to increase sustained activation;Activity to increase lateral weight shifting;Activity to increase anterior-posterior weight shifting during gait x 25' with LUE support on wall rail and therapist on R side providing facilitation for upright trunk, L trunk elongation and weight shifting to L and for RLE advancement and approximation of R  knee during stance for activation.  Performed weight shift, stance control and RLE activation training in standing with LUE support on rail while performing alternating LE soccer ball kicks; still unable to activation or initiate RLE flexion or extension during swing but demonstrated good RLE activation in stance/ W/B.     See Function Navigator for Current Functional Status.   Therapy/Group: Individual Therapy  Raylene Everts [facility] 08/20/2015, 4:52 PM

## 2015-08-20 NOTE — Progress Notes (Signed)
Social Work Patient ID: [name], female   DOB: March 21, 1941, 75 y.o.   MRN: [id number]   Robin Curb, LCSW Social Worker Signed  Patient Care Conference 08/20/2015 10:13 AM    Expand All Collapse All   Inpatient RehabilitationTeam Conference and Plan of Care Update Date: 08/19/2015   Time: 2:30 PM     Patient Name: [name]       Medical Record Number: [id number]  Date of Birth: 1940/06/03 Sex: Female         Room/Bed: 4W16C/4W16C-01 Payor Info: Payor: MEDICARE / Plan: MEDICARE PART A AND B / Product Type: *No Product type* /    Admitting Diagnosis: lt bg hemm   Admit Date/Time:  08/11/2015  2:47 PM Admission Comments: No comment available   Primary Diagnosis:  <principal problem not specified> Principal Problem: <principal problem not specified>    Patient Active Problem List     Diagnosis  Date Noted   .  Somnolence     .  Left Basal ganglia hemorrhage ([place])  08/11/2015   .  Hypertensive emergency  08/11/2015   .  Incontinence  08/11/2015   .  Hypokalemia  08/11/2015   .  Protein-calorie malnutrition ([place])  08/11/2015   .  Hemiplegia affecting dominant side ([place])     .  Aphasia, post-stroke     .  Dysphagia, post-stroke     .  Dysphasia, post-stroke     .  Hemiplegia, post-stroke ([place])     .  Cognitive deficit, post-stroke     .  Seizure prophylaxis     .  Leukocytosis     .  Benign essential HTN     .  HLD (hyperlipidemia)     .  Lethargy     .  Altered mental status     .  Cytotoxic brain edema ([place])  08/05/2015   .  Brain herniation ([place])  08/05/2015   .  ICH (intracerebral hemorrhage) ([place])  08/01/2015   .  Hepatitis  08/12/2010   .  Renal failure  08/12/2010   .  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  CONJUNCTIVITIS, ALLERGIC  09/02/2008   .  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  ALLERGIC RHINITIS  09/02/2008   .  BRONCHITIS  09/02/2008     Expected Discharge Date: Expected Discharge Date: 09/09/15  Team Members Present: Physician  leading conference: Dr. Alger Simons Social Worker Present: Lennart Pall, LCSW Nurse Present: Other (comment) Genene Churn, RN) PT Present: [name], PT OT Present: Roanna Epley, [place], OT SLP Present: Weston Anna, SLP PPS Coordinator present : Daiva Nakayama, RN, CRRN        Current Status/Progress  Goal  Weekly Team Focus   Medical     waxingn and waning cognition, NGT TF's---, diarhea---changing formula. ritalin trial  improve arousal and paricipaion  nutrition,  stool consistency, arousal, skin   Bowel/Bladder     incontinent x2 LBM:08/18/2015. loose due to TF   continent   timed toileting, monitor for regular BM    Swallow/Nutrition/ Hydration     Dys. 1 textures with honey-thick liquids, NG tube and IV for support, limited PO intake due to arousal  Min A with least restrictive diet  tolerance of current diet    ADL's     max/tot A overall; decreased arousal   overall mod A  increased arojusal, task initiation, following one step commands, sitting balance, bed mobility   Mobility  max-total A x 2  mod A wheelchair level  arousal, command following, functional transfers, sitting balance, activity tolerance, positioning, family education    Communication     Nonverbal, Total A   Mod A  arousal, multimodal communication, yes/no accuracy, object identification   Safety/Cognition/ Behavioral Observations    Total A   Min-Mod A  arousal, following commands, attention, attention to midline/right field of enviornment    Pain     no signs of pain   remain pain free   utilize faces pain scale.    Skin     MASD, Right Hip Abrasion and generalized bruising   remain free from skin breadown or infection   turn and reposition q2     Rehab Goals Rehab Goals Revised: minimal changes since last week. *See Care Plan and progress notes for long and short-term goals.    Barriers to Discharge:  see prior     Possible Resolutions to Barriers:   incrase stimulant,  continue to engage, normalize day/night      Discharge Planning/Teaching Needs:   Most likely plan is for pt to d/c to a SNF either at [facility] or another facility affiliated with [facility].       Team Discussion:    Increased ritalin today but pt continues with poor arousal majority of the day.  Slightly better this afternoon with txs.  Very inconsistent presentation and MD feels this is to be expected.  Team discussing d/c care needs and concern over what husband's expectations are for longer term recovery.  Education ongoing with spouse and family.   Revisions to Treatment Plan:    None    Continued Need for Acute Rehabilitation Level of Care: The patient requires daily medical management by a physician with specialized training in physical medicine and rehabilitation for the following conditions: Daily direction of a multidisciplinary physical rehabilitation program to ensure safe treatment while eliciting the highest outcome that is of practical value to the patient.: Yes Daily medical management of patient stability for increased activity during participation in an intensive rehabilitation regime.: Yes Daily analysis of laboratory values and/or radiology reports with any subsequent need for medication adjustment of medical intervention for : Neurological problems;Nutritional problems;Urological problems;Mood/behavior problems  [name] 08/20/2015, 10:13 AM

## 2015-08-20 NOTE — Progress Notes (Signed)
Speech Language Pathology Daily Session Note  Patient Details  Name: LYUBOV MARGISON MRN: [id number] Date of Birth: 05-Jan-1941  Today's Date: 08/20/2015 SLP Individual Time: 0900-1000 SLP Individual Time Calculation (min): 60 min  Short Term Goals: Week 2: SLP Short Term Goal 1 (Week 2): Patient will consume current diet without overt s/s of aspiration with Mod A verbal cues for use of swallowing compensatory strategies.  SLP Short Term Goal 2 (Week 2): Patient will answer basic yes/no questions utilizing multimodal communication with 75% accuracy with Max A multimodal cues.  SLP Short Term Goal 3 (Week 2): Patient will follow basic 1 step commands in 75% of opporunities with Mod A multimodal cues.  SLP Short Term Goal 4 (Week 2): Patient will maintain arousal in regards to keeping her eyes open for 30 minutes with Max A multimodal cues over 2 consecutive sessions.  SLP Short Term Goal 5 (Week 2): Patient will vocalize with Max A multimodal cues in 20% of opportunities  SLP Short Term Goal 6 (Week 2): Patient will name functional items at the word level with Max a multimodal cues in 20% of opportunities.   Skilled Therapeutic Interventions: Skilled treatment session focused on dysphagia and communication goals. Upon arrival, patient was awake while sitting upright in the wheelchair.  SLP facilitated the session with skilled observations completed during presentations of patient's currently prescribed diet to continue to monitor toleration and work towards progression. Pt declined trials of dys 1 textures and consumed limited amounts of honey thick liquids with supervision. No overt s/s of aspiration were evident but presentations were limited due to pt declining further POs beyond  ~1 oz of liquids.Patient was able to sustain voicing when counting from 1-5 for 100% of targets with initial Max A verbal and visual cues which faded to min assist verbal and visual cues by end of task.Patient answered  basic yes/no questions with use of written aids in regards to biographical information with 90% accuracy and identified family members from pictures with Max A multimodal cues, suspect function was impacted by visually distracting environment. Patient also wrote her first name with Mod I and required Total A to self-monitor and correct errors while writing her middle and last name. Patient was left in wheelchair with husband and son at bedside. Continue with current plan of care.    Function:  Eating Eating   Modified Consistency Diet: Yes Eating Assist Level: Supervision or verbal cues           Cognition Comprehension Comprehension assist level: Understands basic 25 - 49% of the time/ requires cueing 50 - 75% of the time  Expression   Expression assist level: Expresses basic 25 - 49% of the time/requires cueing 50 - 75% of the time. Uses single words/gestures.  Social Interaction Social Interaction assist level: Interacts appropriately 25 - 49% of time - Needs frequent redirection.  Problem Solving Problem solving assist level: Solves basic 25 - 49% of the time - needs direction more than half the time to initiate, plan or complete simple activities  Memory Memory assist level: Recognizes or recalls less than 25% of the time/requires cueing greater than 75% of the time    Pain Pain Assessment Pain Assessment: Faces Pain Score: 0-No pain  Therapy/Group: Individual Therapy  Taggart Prasad 08/20/2015, 5:04 PM

## 2015-08-20 NOTE — Progress Notes (Addendum)
Patient initially refusing to eat lunch.  I told patient that if she did not eat, the NG tube would need to be replaced in her nose for her to get nutrition and meds.  Patient immediately picked up spoon and began eating mashed potatoes.  She took scheduled medications.  She ate approximately 90% of her mashed potatoes and only bites of the other items.  Spoke with Marlowe Sh[place], PA regarding intake.  Since patient will need a larger bore NG tube instead of smaller coretrak tube I left message for Dr. London Pepper to call RN regarding feeding tube options.  Brita Romp, RN

## 2015-08-20 NOTE — Progress Notes (Signed)
Speech Language Pathology Daily Session Note  Patient Details  Name: [name] MRN: [id number] Date of Birth: 03-Mar-1941  Today's Date: 08/20/2015 SLP Individual Time: 1100-1200 SLP Individual Time Calculation (min): 60 min  Short Term Goals: See week 2  Skilled Therapeutic Interventions: Pt seen for skilled SLP services focusing on speech/language and swallowing. Pt trialed with dry solid and magic cup. Pt demonstrated intermittent wet vocal quality with magic cup, likely secondary to oral holding and thinning down of PO with saliva. No oral residue noted with small bite-size of dry-solid x 2. Pt required consistent verbal encouragement to participate with PO trials. Pt unable to demonstrate accuracy with yes/no response with visual binary choices and mod cueing. Picture ID in a field of 2 was 80% with vertical arrangement and 50% with horizontal arrangement. Pt able to demonstrate voicing with counting 1-5 and singing A-P with max A of model and multiple repetitions. Will continue to follow.    Function:  Eating Eating   Modified Consistency Diet: Yes Eating Assist Level: Supervision or verbal cues;Helper checks for pocketed food   Eating Set Up Assist For: Opening containers;Cutting food Helper [place] on Utensil: Occasionally     Cognition Comprehension Comprehension assist level: Understands basic 25 - 49% of the time/ requires cueing 50 - 75% of the time  Expression   Expression assist level: Expresses basis less than 25% of the time/requires cueing >75% of the time.  Social Interaction Social Interaction assist level: Interacts appropriately less than 25% of the time. May be withdrawn or combative.  Problem Solving Problem solving assist level: Solves basic less than 25% of the time - needs direction nearly all the time or does not effectively solve problems and may need a restraint for safety  Memory Memory assist level: Recognizes or recalls less than 25% of the  time/requires cueing greater than 75% of the time    Pain Pain Assessment Pain Assessment: Faces Faces Pain Scale: No hurt  Therapy/Group: Individual Therapy  Vinetta Bergamo MA, CCC-SLP 08/20/2015, 12:47 PM

## 2015-08-20 NOTE — Progress Notes (Signed)
Spoke with Myriam Jacobson on [facility] tube team to replace NG tube today.  Brita Romp, RN

## 2015-08-20 NOTE — Patient Care Conference (Signed)
Inpatient RehabilitationTeam Conference and Plan of Care Update Date: 08/19/2015   Time: 2:30 PM    Patient Name: [name]      Medical Record Number: [id number]  Date of Birth: 10/18/1940 Sex: Female         Room/Bed: 4W16C/4W16C-01 Payor Info: Payor: MEDICARE / Plan: MEDICARE PART A AND B / Product Type: *No Product type* /    Admitting Diagnosis: lt bg hemm  Admit Date/Time:  08/11/2015  2:47 PM Admission Comments: No comment available   Primary Diagnosis:  <principal problem not specified> Principal Problem: <principal problem not specified>  Patient Active Problem List   Diagnosis Date Noted  . Somnolence   . Left Basal ganglia hemorrhage ([place]) 08/11/2015  . Hypertensive emergency 08/11/2015  . Incontinence 08/11/2015  . Hypokalemia 08/11/2015  . Protein-calorie malnutrition ([place]) 08/11/2015  . Hemiplegia affecting dominant side ([place])   . Aphasia, post-stroke   . Dysphagia, post-stroke   . Dysphasia, post-stroke   . Hemiplegia, post-stroke ([place])   . Cognitive deficit, post-stroke   . Seizure prophylaxis   . Leukocytosis   . Benign essential HTN   . HLD (hyperlipidemia)   . Lethargy   . Altered mental status   . Cytotoxic brain edema ([place]) 08/05/2015  . Brain herniation ([place]) 08/05/2015  . ICH (intracerebral hemorrhage) ([place]) 08/01/2015  . Hepatitis 08/12/2010  . Renal failure 08/12/2010  . 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 CONJUNCTIVITIS, ALLERGIC 09/02/2008  . 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 ALLERGIC RHINITIS 09/02/2008  . BRONCHITIS 09/02/2008    Expected Discharge Date: Expected Discharge Date: 09/09/15  Team Members Present: Physician leading conference: Dr. Alger Simons Social Worker Present: Lennart Pall, LCSW Nurse Present: Other (comment) Genene Churn, RN) PT Present: [name], PT OT Present: Roanna Epley, [place], OT SLP Present: Weston Anna, SLP PPS Coordinator present : Daiva Nakayama, RN, CRRN     Current  Status/Progress Goal Weekly Team Focus  Medical   waxingn and waning cognition, NGT TF's---, diarhea---changing formula. ritalin trial  improve arousal and paricipaion  nutrition,  stool consistency, arousal, skin   Bowel/Bladder   incontinent x2 LBM:08/18/2015. loose due to TF   continent   timed toileting, monitor for regular BM    Swallow/Nutrition/ Hydration   Dys. 1 textures with honey-thick liquids, NG tube and IV for support, limited PO intake due to arousal  Min A with least restrictive diet  tolerance of current diet    ADL's   max/tot A overall; decreased arousal  overall mod A  increased arojusal, task initiation, following one step commands, sitting balance, bed mobility   Mobility   max-total A x 2  mod A wheelchair level  arousal, command following, functional transfers, sitting balance, activity tolerance, positioning, family education   Communication   Nonverbal, Total A   Mod A  arousal, multimodal communication, yes/no accuracy, object identification   Safety/Cognition/ Behavioral Observations  Total A   Min-Mod A  arousal, following commands, attention, attention to midline/right field of enviornment    Pain   no signs of pain   remain pain free   utilize faces pain scale.    Skin   MASD, Right Hip Abrasion and generalized bruising   remain free from skin breadown or infection   turn and reposition q2     Rehab Goals Rehab Goals Revised: minimal changes since last week. *See Care Plan and progress notes for long and short-term goals.  Barriers to Discharge: see prior    Possible  Resolutions to Barriers:  incrase stimulant, continue to engage, normalize day/night    Discharge Planning/Teaching Needs:  Most likely plan is for pt to d/c to a SNF [facility] at [facility] or another facility affiliated with [place].      Team Discussion:  Increased ritalin today but pt continues with poor arousal majority of the day.  Slightly better this afternoon with txs.  Very  inconsistent presentation and MD feels this is to be expected.  Team discussing d/c care needs and concern over what husband's expectations are for longer term recovery.  Education ongoing with spouse and family.  Revisions to Treatment Plan:  None   Continued Need for Acute Rehabilitation Level of Care: The patient requires daily medical management by a physician with specialized training in physical medicine and rehabilitation for the following conditions: Daily direction of a multidisciplinary physical rehabilitation program to ensure safe treatment while eliciting the highest outcome that is of practical value to the patient.: Yes Daily medical management of patient stability for increased activity during participation in an intensive rehabilitation regime.: Yes Daily analysis of laboratory values and/or radiology reports with any subsequent need for medication adjustment of medical intervention for : Neurological problems;Nutritional problems;Urological problems;Mood/behavior problems  Cristy Colmenares 08/20/2015, 10:13 AM

## 2015-08-20 NOTE — Progress Notes (Signed)
Orthopedic Tech Progress Note Patient Details:  [name] Aug 31, 1940 [id number]  Patient ID: [name], female   DOB: Mar 08, 1941, 75 y.o.   MRN: [id number] Called in advanced brace order; spoke with Evangeline Dakin, Kaylenn Civil 08/20/2015, 9:37 AM

## 2015-08-20 NOTE — Progress Notes (Signed)
2230 Patient found sitting on side of bed attempting to stand. Bed alarm set not alarming. Patient pulled out panda tube. Pulled the tube apart in front of the nurse and threw it on the floor. Patient last seen resting comfortably with husband at bedside.

## 2015-08-20 NOTE — Progress Notes (Signed)
[place] PHYSICAL MEDICINE & REHABILITATION     PROGRESS NOTE    Subjective/Complaints: Pt much more awake. NGT pulled out again. Appears comfortable      ROS limited due to cognitive status  Objective: Vital Signs: Blood pressure 151/72, pulse 70, temperature 98.1 F (36.7 C), temperature source Oral, resp. rate 16, weight 65 kg (143 lb 4.8 oz), SpO2 97 %. Dg Abd Portable 1v  08/18/2015  CLINICAL DATA:  Feeding tube placement. EXAM: PORTABLE ABDOMEN - 1 VIEW COMPARISON:  08/16/2015. FINDINGS: Feeding tube terminates in the distal duodenum, approaching the ligament of Treitz. Bowel gas pattern is unremarkable. Visualized lung bases are clear. IMPRESSION: Feeding tube is in the distal duodenum. Electronically Signed   By: Lorin Picket M.D.   On: 08/18/2015 12:37   No results for input(s): WBC, HGB, HCT, PLT in the last 72 hours.  Recent Labs  08/18/15 0545  NA 137  K 3.9  CL 101  GLUCOSE 99  BUN 10  CREATININE 0.61  CALCIUM 9.3   CBG (last 3)   Recent Labs  08/20/15 0008 08/20/15 0427 08/20/15 0851  GLUCAP 92 101* 118*    Wt Readings from Last 3 Encounters:  08/20/15 65 kg (143 lb 4.8 oz)  08/05/15 64.5 kg (142 lb 3.2 oz)  06/14/11 64.501 kg (142 lb 3.2 oz)    Physical Exam:  Constitutional: She appears well-developed, well nourished. NAD. HENT:  Head: Normocephalic. Atraumatic.  Eyes:  Pupils reactive to light  Neck: Normal range of motion. Neck supple. No thyromegaly present.  Cardiovascular: Normal rate and regular rhythm. + systolic Murmur Respiratory: Effort normal and breath sounds normal. No respiratory distress. no wheezes or rales GI: Soft. Bowel sounds are normal. She exhibits no distension.  Neurological:  Very alert. Makes good eye contact. Squeezes my hand. Does little else for me. Attention appears much improved however DTRs 3+ right upper and right lower extremity Little reaction to pinch right arm/leg. Motor:  Right upper  extremity and right lower extremity  0/5 Left upper and left lower extremity ~ 3+/5 or more (patient not participating in MMT) + Babinski bilaterally.  Flexor tone RUE---2/4, tr to1/4 RLE Skin: Skin is warm and dry.   Assessment/Plan: 1. 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 Physiatrist is providing close team supervision and 24 hour management of active medical problems listed below. Physiatrist and rehab team continue to assess barriers to discharge/monitor patient progress toward functional and medical goals.  Function:  Bathing Bathing position Bathing activity did not occur: Safety/medical concerns Position: Bed  Bathing parts Body parts bathed by patient: Left upper leg, Abdomen Body parts bathed by helper: Right arm, Left arm, Chest, Front perineal area, Right upper leg, Right lower leg, Left lower leg, Back  Bathing assist Assist Level:  (Total assist)      Upper Body Dressing/Undressing Upper body dressing   What is the patient wearing?: Pull over shirt/dress     Pull over shirt/dress - Perfomed by patient: Put head through opening Pull over shirt/dress - Perfomed by helper: Thread/unthread right sleeve, Thread/unthread left sleeve        Upper body assist Assist Level:  (Max Assist)      Lower Body Dressing/Undressing Lower body dressing Lower body dressing/undressing activity did not occur: N/A What is the patient wearing?: Pants       Pants- Performed by helper: Thread/unthread  right pants leg, Thread/unthread left pants leg, Pull pants up/down, Fasten/unfasten pants   Non-skid slipper socks- Performed by helper: Don/doff right sock, Don/doff left sock   Socks - Performed by helper: Don/doff right sock, Don/doff left sock   Shoes - Performed by helper: Don/doff right shoe, Don/doff left shoe, Fasten right, Fasten  left       TED Hose - Performed by helper: Don/doff right TED hose, Don/doff left TED hose  Lower body assist Assist for lower body dressing:  (Total assist)      Toileting Toileting Toileting activity did not occur: Safety/medical concerns   Toileting steps completed by helper: Adjust clothing prior to toileting, Performs perineal hygiene, Adjust clothing after toileting Toileting Assistive Devices: Grab bar or rail  Toileting assist Assist level: More than reasonable time, Two helpers   Transfers Chair/bed transfer   Chair/bed transfer method: Other Chair/bed transfer assist level: 2 helpers Chair/bed transfer assistive device: Mechanical lift Mechanical lift: Maximove   Locomotion Ambulation Ambulation activity did not occur: Safety/medical concerns         Wheelchair   Type: Manual (TIS wheelchair)   Assist Level: Dependent (Pt equals 0%)  Cognition Comprehension Comprehension assist level: Understands basic 25 - 49% of the time/ requires cueing 50 - 75% of the time  Expression Expression assist level: Expresses basic 25 - 49% of the time/requires cueing 50 - 75% of the time. Uses single words/gestures.  Social Interaction Social Interaction assist level: Interacts appropriately less than 25% of the time. May be withdrawn or combative.  Problem Solving Problem solving assist level: Solves basic less than 25% of the time - needs direction nearly all the time or does not effectively solve problems and may need a restraint for safety  Memory Memory assist level: Recognizes or recalls less than 25% of the time/requires cueing greater than 75% of the time   Medical Problem List and Plan: 1. 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 DVT Prophylaxis/Anticoagulation: Subcutaneous heparin initiated 08/09/2015 3. Pain Management: Hydrocodone as needed 4. Dysphagia. Dysphagia #2 honey  thick liquids.  -continue IV fluids for hydration-  hs only     -NGT--d/w team regarding replacing vs pushing PO (if alert). Third option is PEG  -left wrist restraint required to keep NGT in place (while it's in) 5. Neuropsych: This patient is not capable of making decisions on her own behalf. 6. Skin/Wound Care: Routine skin checks,    7. Decreased nutritional storage. Dietary follow-up. Check calorie counts  -see above, potentially replace NGT today  8. Fluids/Electrolytes/Nutrition: following lytes,   -continue replete potassium  -  9. Seizure prophylaxis. Keppra held for somnolence EEG neg, Hold per neuro 10. Leukocytosis. Follow-up urine study/follow-up CBC 11. Hypertension. Cozaar 100 mg daily. Monitor with increased mobility 12. Hyperlipidemia. Lipitor 13. Lethargy post stroke: arousal continues to wax and wane--very alert today  -continue ritalin, increased to 10mg   -keppra stopped by neurology,  -appreciate neuro help 14. Low grade temp resolved  -ua unremarkable. -given defervescence will hold on cx  -CXR clear 15. Spasticity: PRAFO RLE  -continue ROM/modalities  -  16. Loose stool: likely from TF formula---still watery  -Formula adjusted  -continue probiotic. Added fiber, cholestyramine     LOS (Days) 9 A FACE TO FACE EVALUATION WAS PERFORMED  Rolando Hessling T 08/20/2015 8:59 AM

## 2015-08-20 NOTE — Progress Notes (Signed)
Orthopedic Tech Progress Note Patient Details:  [name] May 03, 1940 [id number] Completed by hanger vendor. Patient ID: [name], female   DOB: [birth year], 75 y.o.   MRN: [id number]   Braulio Bosch 08/20/2015, 3:28 PM

## 2015-08-20 NOTE — Progress Notes (Signed)
Speech Language Pathology Daily Session Note  Patient Details  Name: [name] MRN: [id number] Date of Birth: July 10, 1940  Today's Date: 08/20/2015 SLP Individual Time: 1500-1530 SLP Individual Time Calculation (min): 30 min  Short Term Goals: Week 2: SLP Short Term Goal 1 (Week 2): Patient will consume current diet without overt s/s of aspiration with Mod A verbal cues for use of swallowing compensatory strategies.  SLP Short Term Goal 2 (Week 2): Patient will answer basic yes/no questions utilizing multimodal communication with 75% accuracy with Max A multimodal cues.  SLP Short Term Goal 3 (Week 2): Patient will follow basic 1 step commands in 75% of opporunities with Mod A multimodal cues.  SLP Short Term Goal 4 (Week 2): Patient will maintain arousal in regards to keeping her eyes open for 30 minutes with Max A multimodal cues over 2 consecutive sessions.  SLP Short Term Goal 5 (Week 2): Patient will vocalize with Max A multimodal cues in 20% of opportunities  SLP Short Term Goal 6 (Week 2): Patient will name functional items at the word level with Max a multimodal cues in 20% of opportunities.   Skilled Therapeutic Interventions: Pt was seen for skilled ST targeting goals for dysphagia and communication.  SLP facilitated the session with skilled observations completed during presentations of pt's currently prescribed diet to continue to monitor toleration and work towards progression.  Pt consumed dys 1 textures and honey thick liquids with supervision.  No overt s/s of aspiration were evident with purees or thickened liquids but presentations were limited due to pt declining further POs beyond 3-5 bites of purees and ~1 oz of liquids.  Pt was able to sustain voicing when counting from 1-10 for 60% of targets which improved to 70% with repetition of trials and mod assist verbal and visual cues.  Pt required mod verbal and visual cues for cessation of perseverative verbal utterances when  alternating between counting and reciting letters of the alphabet.  Pt was left in wheelchair with husband at bedside.  Continue per current plan of care.    Function:  Eating Eating   Modified Consistency Diet: Yes Eating Assist Level: Supervision or verbal cues         Cognition Comprehension Comprehension assist level: Understands basic 25 - 49% of the time/ requires cueing 50 - 75% of the time  Expression   Expression assist level: Expresses basic 25 - 49% of the time/requires cueing 50 - 75% of the time. Uses single words/gestures.  Social Interaction Social Interaction assist level: Interacts appropriately 25 - 49% of time - Needs frequent redirection.  Problem Solving Problem solving assist level: Solves basic 25 - 49% of the time - needs direction more than half the time to initiate, plan or complete simple activities  Memory Memory assist level: Recognizes or recalls less than 25% of the time/requires cueing greater than 75% of the time    Pain Pain Assessment Pain Assessment: Faces Pain Score: 0-No pain Faces Pain Scale: No hurt  Therapy/Group: Individual Therapy  [name], Selinda Orion 08/20/2015, 4:12 PM

## 2015-08-20 NOTE — Progress Notes (Signed)
Received call from RN that patient had pulled out Cortrak again. Was called to replace the cortak for the patient. Upon discussion with the cortak team, the rehab admission personnel and Olin Hauser the [place] on rehab it was discussed that going forward an NG can be replaced for her. This would have been her 7th replacement and since the cortrak team may not be able to respond immediately to prevent the patient from going without her medication of tube feedings a NG tube placed by the bedside RN may be the best option so that those things may be restarted immediately. Discussed this further with the RN over the phone.

## 2015-08-20 NOTE — Progress Notes (Signed)
Occupational Therapy Session Note  Patient Details  Name: [name] MRN: [id number] Date of Birth: 03-31-1941  Today's Date: 08/20/2015 OT Individual Time: 0800-0900 OT Individual Time Calculation (min): 60 min    Short Term Goals: Week 1:  OT Short Term Goal 1 (Week 1): Pt will participate in self care task for 2 minutes  in order to decrease level of assist with self care.  OT Short Term Goal 1 - Progress (Week 1): Progressing toward goal OT Short Term Goal 2 (Week 1): Pt will locate 3 items to the R during self care tasks by pointing to them in order to demonstrate visual scanning with mod cues. OT Short Term Goal 2 - Progress (Week 1): Progressing toward goal OT Short Term Goal 3 (Week 1): Pt will perform rolls to the left and right with max A or less during LB clothing management and hygiene n order to decrease level of care. OT Short Term Goal 3 - Progress (Week 1): Progressing toward goal  Skilled Therapeutic Interventions/Progress Updates:    Pt resting in bed upon arrival.  Pt incontinent of bladder and required tot A for hygiene and donning brief.  Pt required mod A for supine>sit EOB to engage in dressing tasks with sit<>stand from EOB.  Pt required tot A for sit<>stand and standing balance to pull up pants.  Pt initiated sit>stand and pulling up pants. Pt initiated threading BLE into pants but required assistance to complete task. Pt donned pull over shirt sitting EOB requiring assistance to thread her RUE into shirt sleeve.  Pt required mod A for squat pivot transfer (to her left) to w/c.  Pt engaged in oral care while seated in w/c requiring mod A to complete task for safety.  Pt remained in w/c when SLP entered room for therapy session. Pt attempted to verbalize numerous times, repeating "what" and "when".  Pt appeared to be frustrated that she was unable to express her thoughts.   Therapy Documentation Precautions:  Precautions Precautions: Fall Precaution Comments: R  hemiplegia, R neglect, nonverbal Restrictions Weight Bearing Restrictions: No  Pain:  Pt with no s/s of pain  See Function Navigator for Current Functional Status.   Therapy/Group: Individual Therapy  [name] 08/20/2015, 11:33 AM

## 2015-08-20 NOTE — Progress Notes (Signed)
Spoke with husband regarding NG tube.  Given that the patient is eating a little now, he would like to hold off on NG tube insertion for the next 24 hours or so.  Will continue to monitor.  Brita Romp, RN

## 2015-08-21 ENCOUNTER — Inpatient Hospital Stay (HOSPITAL_COMMUNITY): Payer: Medicare Other | Admitting: Speech Pathology

## 2015-08-21 ENCOUNTER — Inpatient Hospital Stay (HOSPITAL_COMMUNITY): Payer: Medicare Other | Admitting: *Deleted

## 2015-08-21 ENCOUNTER — Inpatient Hospital Stay (HOSPITAL_COMMUNITY): Payer: Medicare Other

## 2015-08-21 ENCOUNTER — Inpatient Hospital Stay (HOSPITAL_COMMUNITY): Payer: Medicare Other | Admitting: Physical Therapy

## 2015-08-21 LAB — GLUCOSE, CAPILLARY
GLUCOSE-CAPILLARY: 100 mg/dL — AB (ref 65–99)
GLUCOSE-CAPILLARY: 85 mg/dL (ref 65–99)
GLUCOSE-CAPILLARY: 93 mg/dL (ref 65–99)
GLUCOSE-CAPILLARY: 98 mg/dL (ref 65–99)
GLUCOSE-CAPILLARY: 99 mg/dL (ref 65–99)
Glucose-Capillary: 96 mg/dL (ref 65–99)

## 2015-08-21 MED ORDER — ENSURE ENLIVE PO LIQD
237.0000 mL | Freq: Three times a day (TID) | ORAL | Status: DC
  Administered 2015-08-21 – 2015-09-11 (×34): 237 mL via ORAL

## 2015-08-21 MED ORDER — MEGESTROL ACETATE 400 MG/10ML PO SUSP
400.0000 mg | Freq: Two times a day (BID) | ORAL | Status: DC
  Administered 2015-08-21 – 2015-09-12 (×41): 400 mg via ORAL
  Filled 2015-08-21 (×44): qty 10

## 2015-08-21 NOTE — Progress Notes (Signed)
[place] PHYSICAL MEDICINE & REHABILITATION     PROGRESS NOTE    Subjective/Complaints: Pt had a good day yesterday. Ate 25% lunch and 50% dinner. Night was quiet per RN    ROS remains limited due to cognitive status  Objective: Vital Signs: Blood pressure 149/67, pulse 61, temperature 98.3 F (36.8 C), temperature source Oral, resp. rate 16, weight 63.504 kg (140 lb), SpO2 98 %. No results found. No results for input(s): WBC, HGB, HCT, PLT in the last 72 hours. No results for input(s): NA, K, CL, GLUCOSE, BUN, CREATININE, CALCIUM in the last 72 hours.  Invalid input(s): CO CBG (last 3)   Recent Labs  08/20/15 2000 08/21/15 0057 08/21/15 0402  GLUCAP 101* 100* 85    Wt Readings from Last 3 Encounters:  08/21/15 63.504 kg (140 lb)  08/05/15 64.5 kg (142 lb 3.2 oz)  06/14/11 64.501 kg (142 lb 3.2 oz)    Physical Exam:  Constitutional: She appears well-developed, well nourished. NAD. HENT:  Head: Normocephalic. Atraumatic.  Eyes:  Pupils reactive to light  Neck: Normal range of motion. Neck supple. No thyromegaly present.  Cardiovascular: Normal rate and regular rhythm. + systolic Murmur Respiratory: Effort normal and breath sounds normal. No respiratory distress. no wheezes or rales GI: Soft. Bowel sounds are normal. She exhibits no distension.  Neurological:  Very alert. Makes good eye contact. Squeezes my hand. Does little else for me. Attention appears much improved however DTRs 3+ right upper and right lower extremity Little reaction to pinch right arm/leg. Motor:  Right upper extremity and right lower extremity  0/5 Left upper and left lower extremity ~ 3+/5 or more (patient not participating in MMT) + Babinski bilaterally.  Flexor tone RUE---2/4, tr to1/4 RLE Skin: Skin is warm and dry.   Assessment/Plan: 1. 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 Physiatrist is providing close team supervision and 24 hour management of active medical problems listed below. Physiatrist and rehab team continue to assess barriers to discharge/monitor patient progress toward functional and medical goals.  Function:  Bathing Bathing position Bathing activity did not occur: Safety/medical concerns Position: Bed  Bathing parts Body parts bathed by patient: Left upper leg, Abdomen Body parts bathed by helper: Right arm, Left arm, Chest, Front perineal area, Right upper leg, Right lower leg, Left lower leg, Back  Bathing assist Assist Level:  (Total assist)      Upper Body Dressing/Undressing Upper body dressing   What is the patient wearing?: Pull over shirt/dress     Pull over shirt/dress - Perfomed by patient: Put head through opening Pull over shirt/dress - Perfomed by helper: Thread/unthread right sleeve, Thread/unthread left sleeve, Pull shirt over trunk        Upper body assist Assist Level:  (Max Assist)      Lower Body Dressing/Undressing Lower body dressing Lower body dressing/undressing activity did not occur: N/A What is the patient wearing?: Ted Hose, Non-skid slipper socks, Pants       Pants- Performed by helper: Thread/unthread right pants leg, Thread/unthread left pants leg, Pull pants up/down, Fasten/unfasten pants   Non-skid slipper socks- Performed by helper: Don/doff right sock, Don/doff left sock   Socks - Performed by helper: Don/doff right sock, Don/doff left sock   Shoes - Performed by helper: Don/doff right shoe, Don/doff left shoe, Fasten right, Fasten left       TED Hose -  Performed by helper: Don/doff right TED hose, Don/doff left TED hose  Lower body assist Assist for lower body dressing:  (Total assist)      Toileting Toileting Toileting activity did not occur: Safety/medical concerns   Toileting steps completed by helper: Adjust clothing prior to toileting,  Performs perineal hygiene, Adjust clothing after toileting Toileting Assistive Devices: Grab bar or rail  Toileting assist Assist level: More than reasonable time, Two helpers   Transfers Chair/bed transfer   Chair/bed transfer method: Other Chair/bed transfer assist level: 2 helpers Chair/bed transfer assistive device: Mechanical lift Mechanical lift: Maximove   Locomotion Ambulation Ambulation activity did not occur: Safety/medical concerns   Max distance: 25 Assist level: Maximal assist (Pt 25 - 49%)   Wheelchair   Type: Manual (TIS wheelchair)   Assist Level: Dependent (Pt equals 0%)  Cognition Comprehension Comprehension assist level: Understands basic 25 - 49% of the time/ requires cueing 50 - 75% of the time  Expression Expression assist level: Expresses basic 25 - 49% of the time/requires cueing 50 - 75% of the time. Uses single words/gestures.  Social Interaction Social Interaction assist level: Interacts appropriately 25 - 49% of time - Needs frequent redirection.  Problem Solving Problem solving assist level: Solves basic 25 - 49% of the time - needs direction more than half the time to initiate, plan or complete simple activities  Memory Memory assist level: Recognizes or recalls less than 25% of the time/requires cueing greater than 75% of the time   Medical Problem List and Plan: 1. 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 DVT Prophylaxis/Anticoagulation: Subcutaneous heparin  3. Pain Management: Hydrocodone as needed 4. Dysphagia. Dysphagia #2 honey thick liquids.  -continue IV fluids for hydration-  hs only     -if she is alert enough and able to attend, PO alone is an option  -add megace to help with any appetite component  5. Neuropsych: This patient is not capable of making decisions on her own behalf. 6. Skin/Wound Care: Routine skin checks,    7. Decreased  nutritional storage. Dietary follow-up. Check calorie counts     8. Fluids/Electrolytes/Nutrition: following lytes,   -continue replete potassium 9. Seizure prophylaxis. Keppra held for somnolence EEG neg, Hold per neuro 10. Leukocytosis. Follow-up urine study/follow-up CBC 11. Hypertension. Cozaar 100 mg daily. Monitor with increased mobility 12. Hyperlipidemia. Lipitor 13. Lethargy post stroke: arousal continues to wax and wane--very alert today  -continue ritalin at 10mg   -keppra stopped by neurology,  -appreciate neuro help 14. Low grade temp resolved  -ua unremarkable. -given defervescence will hold on cx  -CXR clear 15. Spasticity: PRAFO RLE  -continue ROM/modalities  -  16. Loose stool: improved with cessation of TF  -dc cholestyramine for now    LOS (Days) 10 A FACE TO FACE EVALUATION WAS PERFORMED  Jj Enyeart T 08/21/2015 8:47 AM

## 2015-08-21 NOTE — Progress Notes (Signed)
Social Work Patient ID: [name], female   DOB: 1940/05/29, 75 y.o.   MRN: [id number]   Spoke with pt's spouse yesterday to review team conference.  He is aware that team has targeted d/c date of 5/23 and he fully expects for pt to d/c to SNF.  He was a slightly more encouraged with pt's increased alertness yesterday but concerned overall with her limited gains.  Will continue to follow for d/c planning and support needs.  Jaimarie Rapozo, LCSW

## 2015-08-21 NOTE — Progress Notes (Signed)
Speech Language Pathology Daily Session Note  Patient Details  Name: [name] MRN: [id number] Date of Birth: 1941-04-17  Today's Date: 08/21/2015 SLP Individual Time: [id number] SLP Individual Time Calculation (min): 15 min and Today's Date: 08/21/2015 SLP Missed Time: 15 Minutes Missed Time Reason: Patient fatigue  Short Term Goals: Week 2: SLP Short Term Goal 1 (Week 2): Patient will consume current diet without overt s/s of aspiration with Mod A verbal cues for use of swallowing compensatory strategies.  SLP Short Term Goal 2 (Week 2): Patient will answer basic yes/no questions utilizing multimodal communication with 75% accuracy with Max A multimodal cues.  SLP Short Term Goal 3 (Week 2): Patient will follow basic 1 step commands in 75% of opporunities with Mod A multimodal cues.  SLP Short Term Goal 4 (Week 2): Patient will maintain arousal in regards to keeping her eyes open for 30 minutes with Max A multimodal cues over 2 consecutive sessions.  SLP Short Term Goal 5 (Week 2): Patient will vocalize with Max A multimodal cues in 20% of opportunities  SLP Short Term Goal 6 (Week 2): Patient will name functional items at the word level with Max a multimodal cues in 20% of opportunities.   Skilled Therapeutic Interventions: Pt positioned upright in wheelchair. Therapy focus on facilitating alertness/responsiveness. Pt maintained eye closure and poor responsiveness throughout the abbreviated session. No command following. Spontaneous swallow noted x 3 with oral stim, however pt allowed ice chip to fall from mouth rather than initiate any oral manipulation. Therapy session ended. Spouse present during session. Pt did not demonstrate any response to verbal or tactile stim from spouse.   Function:  Eating Eating                 Cognition Comprehension Comprehension assist level: Understands basic less than 25% of the time/ requires cueing >75% of the time  Expression   Expression  assist level: Expresses basis less than 25% of the time/requires cueing >75% of the time.  Social Interaction Social Interaction assist level: Interacts appropriately less than 25% of the time. May be withdrawn or combative.  Problem Solving Problem solving assist level: Solves basic less than 25% of the time - needs direction nearly all the time or does not effectively solve problems and may need a restraint for safety  Memory Memory assist level: Recognizes or recalls less than 25% of the time/requires cueing greater than 75% of the time    Pain Pain Assessment Pain Assessment: Faces Faces Pain Scale: No hurt  Therapy/Group: Individual Therapy  Vinetta Bergamo MA, CCC-SLP 08/21/2015, 1:52 PM

## 2015-08-21 NOTE — Progress Notes (Signed)
Patients spouse came to visit patient.  He saw that patients mental status has not changed since this morning with the patient being lethargic and difficult to arouse.  He told me that he and the sons (who have medical power attorney over the patient) discussed PEG placement for a more consistent means of nutrition and med administration given the fluctuation of patients progress.  I verified with Merry Proud that they were ok with proceeding with PEG placement.  Marlowe Shores, PA notified.  Will continue to monitor.  Brita Romp, RN

## 2015-08-21 NOTE — Progress Notes (Signed)
Nutrition Follow-up  DOCUMENTATION CODES:   Severe malnutrition in context of acute illness/injury  INTERVENTION:  Provide Ensure Enlive po TID (thickened to honey thick liquids), each supplement provides 350 kcal and 20 grams of protein.  Provide Magic cup TID, each supplement provides 290 kcal and 9 grams of protein.  RD to continue to monitor.   NUTRITION DIAGNOSIS:   Malnutrition related to acute illness as evidenced by energy intake < or equal to 50% for > or equal to 5 days, moderate depletions of muscle mass; ongoing  GOAL:   Patient will meet greater than or equal to 90% of their needs; not met  MONITOR:   PO intake, Diet advancement, TF tolerance, Weight trends, Labs, I & O's  REASON FOR ASSESSMENT:   Consult Assessment of nutrition requirement/status  ASSESSMENT:   75 y.o. right handed female with history of hypertension. 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  NGT pulled out 5/2. NGT placement on hold for now. Per RN, pending family decision on another placement of NGT or PEG. Pt no interactive and lethargic today. No po intake today. RN reports pt was more alert yesterday was 25-50% intake yesterday. RD to order nutritional supplements to aid in caloric and protein needs. RD to continue to monitor.   Diet Order:  DIET - DYS 1 Room service appropriate?: Yes; Fluid consistency:: Honey Thick  Skin:  Reviewed, no issues  Last BM:  5/2  Height:   Ht Readings from Last 1 Encounters:  08/01/15 _0  (1.626 m)    Weight:   Wt Readings from Last 1 Encounters:  08/21/15 140 lb (63.504 kg)    Ideal Body Weight:  54.5 kg  BMI:  Body mass index is 24.02 kg/(m^2).  Estimated Nutritional Needs:   Kcal:  1650-1850  Protein:  70-85 grams  Fluid:  1.6 - 1.8 L/day  EDUCATION NEEDS:   No education needs identified at this time  Corrin Parker, MS, RD,  LDN Pager # [phone] After hours/ weekend pager # [phone]

## 2015-08-21 NOTE — Progress Notes (Signed)
Occupational Therapy Session Note  Patient Details  Name: [name] MRN: [id number] Date of Birth: 04-02-1941  Today's Date: 08/21/2015 OT Individual Time: [id number] OT Individual Time Calculation (min): 40 min  and Today's Date: 08/21/2015 OT Missed Time: 20 Minutes Missed Time Reason: Patient fatigue (decreased arousal)   Short Term Goals: Week 2:  OT Short Term Goal 1 (Week 2): Pt will participate in self care task for 2 minutes in order to decrease level of assist with self care. OT Short Term Goal 2 (Week 2): Pt will locate 3 items to the R during self care tasks by pointing to them in order to demonstrate visual scanning with mod cues OT Short Term Goal 3 (Week 2): Pt will perform rolls to the left and right with max A or less during LB clothing management and hygiene in order to decrease burden of care  Skilled Therapeutic Interventions/Progress Updates:    Pt asleep in bed upon arrival and did not open eyes or follow commands throughout therapy session.  Pt was able to maintain static sitting balance with steady A and occasional brief periods of close supervision.  Attempted to engage patient in dressing tasks seated EOB with sit<>stand to pull up pants.  Pt did not initiate any tasks upon command or when presented with clothing items. Pt transitioned to day room to increase level of stimulation with attempts at patient arousing adequately to eat breakfast.  Pt did not open eyes or attempt to self feed or open her mouth when presented with food.  Pt informed that if she doesn't start eating a NG tube would have to be placed again.  Pt did not respond positively or negatively.  Pt returned to nursing station with QRB in place.  Focus on increased arousal, bed mobility, sitting balance, sit<>stand, functional transfers, BADL retraining, self feeding, following one step commands, and task initiation to increase independence with BADLs and decrease burden of care.   Therapy  Documentation Precautions:  Precautions Precautions: Fall Precaution Comments: R hemiplegia, R neglect, nonverbal Restrictions Weight Bearing Restrictions: No General: General OT Amount of Missed Time: 20 Minutes  Pain:  Pt with no s/s of pain  See Function Navigator for Current Functional Status.   Therapy/Group: Individual Therapy  [name] 08/21/2015, 9:27 AM

## 2015-08-21 NOTE — Progress Notes (Signed)
Speech Language Pathology Daily Session Note  Patient Details  Name: [name] MRN: [id number] Date of Birth: 11-30-1940  Today's Date: 08/21/2015 SLP Individual Time: 1000-1030 SLP Individual Time Calculation (min): 30 min and Today's Date: 08/21/2015 SLP Missed Time: 30 Minutes Missed Time Reason: Patient fatigue  Short Term Goals: Week 2: SLP Short Term Goal 1 (Week 2): Patient will consume current diet without overt s/s of aspiration with Mod A verbal cues for use of swallowing compensatory strategies.  SLP Short Term Goal 2 (Week 2): Patient will answer basic yes/no questions utilizing multimodal communication with 75% accuracy with Max A multimodal cues.  SLP Short Term Goal 3 (Week 2): Patient will follow basic 1 step commands in 75% of opporunities with Mod A multimodal cues.  SLP Short Term Goal 4 (Week 2): Patient will maintain arousal in regards to keeping her eyes open for 30 minutes with Max A multimodal cues over 2 consecutive sessions.  SLP Short Term Goal 5 (Week 2): Patient will vocalize with Max A multimodal cues in 20% of opportunities  SLP Short Term Goal 6 (Week 2): Patient will name functional items at the word level with Max a multimodal cues in 20% of opportunities.   Skilled Therapeutic Interventions: Skilled treatment session focused on cognitive goals. Upon arrival, patient was sitting upright in the wheelchair but was asleep with her eyes closed. Patient did not open her eyes or follow commands despite Total A multimodal cues as well as environmental modifications, noxious stimuli, verbal cues or tactile cues. RN aware. However, clinician did walk out of the room and upon arrival, but was scratching her nose but continued to keep her eyes close. Patient missed remaining 30 minutes of session due to decreased arousal. Patient left upright in wheelchair. Continue with current plan of care.   Function:  Cognition Comprehension Comprehension assist level:  Understands basic less than 25% of the time/ requires cueing >75% of the time  Expression   Expression assist level: Expresses basis less than 25% of the time/requires cueing >75% of the time.  Social Interaction Social Interaction assist level: Interacts appropriately less than 25% of the time. May be withdrawn or combative.  Problem Solving Problem solving assist level: Solves basic less than 25% of the time - needs direction nearly all the time or does not effectively solve problems and may need a restraint for safety  Memory Memory assist level: Recognizes or recalls less than 25% of the time/requires cueing greater than 75% of the time    Pain No s/s of pain   Therapy/Group: Individual Therapy  Scarleth Brame 08/21/2015, 4:36 PM

## 2015-08-21 NOTE — Progress Notes (Signed)
Patient has not been interactive today, nor has she eaten anything since medications early this morning.  Marlowe Shores, PA aware.  Holding off on NG tube for now.  Will continue to monitor.  Brita Romp, RN

## 2015-08-21 NOTE — Progress Notes (Signed)
Physical Therapy Session Note  Patient Details  Name: [name] MRN: [id number] Date of Birth: 06-14-1940  Today's Date: 08/21/2015 PT Individual Time: 1410-1508 PT Individual Time Calculation (min): 58 min   Short Term Goals: See Week 2   Skilled Therapeutic Interventions/Progress Updates:   Session focused on arousal, command following, and active participation in functional mobility tasks. Patient asleep in TIS wheelchair, transported to gym and positioned in full upright position and provided max multimodal cues, environmental stimulation, and noxious stimuli with pain response to PROM RUE due to increased flexor tone as demonstrated by increased respiratory rate and moving to L side but keeping eyes closed. Patient consistently followed commands to kick L foot and squeeze L hand only and attempted to open eyes x 2 but remained lethargic. Transferred wheelchair > mat table with max A x 2 and HOH assist to place LUE on armrest to assist with transfer (pt 10%). Patient noted to be incontinent of urine and remained lethargic with mod-max A static sitting balance. Transferred to wheelchair then bed and patient became more alert and participated fully with rolling L and R using bed rails and performed perineal hygiene in supine with setup assist and assist for thoroughness. Patient positioned with HOB raised and demonstrated increased respiratory rate at rest intermittently, RN notified. Patient appeared to be attempting to vocalize something, shook head no when questioned regarding pain. Patient left semireclined in bed with HOB raised and bed alarm on and RN at bedside.    Therapy Documentation Precautions:  Precautions Precautions: Fall Precaution Comments: R hemiplegia, R neglect, nonverbal Restrictions Weight Bearing Restrictions: No Vital Signs: 149/69, HR 69, Sp02 100% ra, RR 60 (RN notified) Pain: Pain Assessment Pain Assessment: Faces Faces Pain Scale: Hurts even more Pain  Type: Acute pain Pain Location: Arm Pain Orientation: Right Pain Descriptors / Indicators: Tightness Pain Onset: With Activity (with stretching due to increased tone) Pain Intervention(s): Repositioned;Other (Comment) (ROM)  See Function Navigator for Current Functional Status.   Therapy/Group: Individual Therapy  [name] 08/21/2015, 3:15 PM

## 2015-08-21 NOTE — Progress Notes (Signed)
RN approached by ST and PT stating that patients breathing pattern was different.  VSS with oxygenation 100% on room air.  RN assessed patient and found patient to be pursed lip breathing- RR rate 32.  Marlowe Shores, PA notified, at bedside to assess patient.  New orders received.  Will continue to monitor.  Brita Romp, RN

## 2015-08-22 ENCOUNTER — Inpatient Hospital Stay (HOSPITAL_COMMUNITY): Payer: Medicare Other

## 2015-08-22 ENCOUNTER — Inpatient Hospital Stay (HOSPITAL_COMMUNITY): Payer: Medicare Other | Admitting: Physical Therapy

## 2015-08-22 ENCOUNTER — Inpatient Hospital Stay (HOSPITAL_COMMUNITY): Payer: Medicare Other | Admitting: Speech Pathology

## 2015-08-22 LAB — GLUCOSE, CAPILLARY
GLUCOSE-CAPILLARY: 119 mg/dL — AB (ref 65–99)
Glucose-Capillary: 103 mg/dL — ABNORMAL HIGH (ref 65–99)
Glucose-Capillary: 105 mg/dL — ABNORMAL HIGH (ref 65–99)
Glucose-Capillary: 116 mg/dL — ABNORMAL HIGH (ref 65–99)
Glucose-Capillary: 85 mg/dL (ref 65–99)
Glucose-Capillary: 87 mg/dL (ref 65–99)

## 2015-08-22 NOTE — Progress Notes (Signed)
Speech Language Pathology Daily Session Note  Patient Details  Name: [name] MRN: [id number] Date of Birth: [birth year]  Today's Date: 08/22/2015 SLP Individual Time: 0900-1000 SLP Individual Time Calculation (min): 60 min  Short Term Goals: Week 2: SLP Short Term Goal 1 (Week 2): Patient will consume current diet without overt s/s of aspiration with Mod A verbal cues for use of swallowing compensatory strategies.  SLP Short Term Goal 2 (Week 2): Patient will answer basic yes/no questions utilizing multimodal communication with 75% accuracy with Max A multimodal cues.  SLP Short Term Goal 3 (Week 2): Patient will follow basic 1 step commands in 75% of opporunities with Mod A multimodal cues.  SLP Short Term Goal 4 (Week 2): Patient will maintain arousal in regards to keeping her eyes open for 30 minutes with Max A multimodal cues over 2 consecutive sessions.  SLP Short Term Goal 5 (Week 2): Patient will vocalize with Max A multimodal cues in 20% of opportunities  SLP Short Term Goal 6 (Week 2): Patient will name functional items at the word level with Max a multimodal cues in 20% of opportunities.   Skilled Therapeutic Interventions: Skilled treatment session focused on cognitive-linguistic goals. Upon arrival, patient was awake while upright in the wheelchair. SLP facilitated session by providing Max A phonemic and sentence completion cues for naming functional items with 90% accuracy. Patient was able to self-monitor errors but required Max A mutlimodal cues to self-correct. Patient also matched a written word to the object from a field of two with 80% accuracy. Patient with consistent ability to vocalize at the word level and was attempting to communicate with clinician but could only spontaneously verbalize "When will I" with perseveration on "wh" sounds throughout the session. Patient also verbalized "yes" spontaneously to one of clinician's questions but utilized gestures to answer the  remaining. Patient left upright in wheelchair with husband present. Continue with current plan of care.    Function:  Cognition Comprehension Comprehension assist level: Understands basic 50 - 74% of the time/ requires cueing 25 - 49% of the time  Expression   Expression assist level: Expresses basic 50 - 74% of the time/requires cueing 25 - 49% of the time. Needs to repeat parts of sentences.  Social Interaction Social Interaction assist level: Interacts appropriately 75 - 89% of the time - Needs redirection for appropriate language or to initiate interaction.  Problem Solving Problem solving assist level: Solves basic 50 - 74% of the time/requires cueing 25 - 49% of the time  Memory Memory assist level: Recognizes or recalls 50 - 74% of the time/requires cueing 25 - 49% of the time    Pain Pain Assessment Pain Assessment: Faces Pain Score: 0-No pain Faces Pain Scale: No hurt  Therapy/Group: Individual Therapy  Colbin Jovel, [facility] 08/22/2015, 12:51 PM

## 2015-08-22 NOTE — Progress Notes (Signed)
[place] PHYSICAL MEDICINE & REHABILITATION     PROGRESS NOTE    Subjective/Complaints: Up in bed. A little restless. No distress    ROS remains limited due to cognitive status  Objective: Vital Signs: Blood pressure 163/75, pulse 72, temperature 98.2 F (36.8 C), temperature source Oral, resp. rate 17, weight 65.726 kg (144 lb 14.4 oz), SpO2 99 %. Ct Abdomen Wo Contrast  08/22/2015  CLINICAL DATA:  Stroke and need for gastrostomy tube for nutrition. CT evaluation of anatomy prior to potential percutaneous gastrostomy tube placement. EXAM: CT ABDOMEN WITHOUT CONTRAST TECHNIQUE: Multidetector CT imaging of the abdomen was performed following the standard protocol without IV contrast. COMPARISON:  01/09/2013 FINDINGS: Lower chest:  Scarring and atelectasis at the right lung base. Hepatobiliary: Unenhanced appearance of the liver and gallbladder are unremarkable. Pancreas: Unenhanced appearance of the pancreas is unremarkable. Spleen: Unenhanced appearance of the spleen is unremarkable. Adrenals/Urinary Tract: No evidence of hydronephrosis or renal calculi. Stomach/Bowel: The stomach is located high in the left upper subphrenic abdomen. The distal stomach is appropriately positioned and anatomy should be amenable for percutaneous gastrostomy tube placement with insufflation of the stomach. The distal stomach is located superior to the transverse colon and anterior to the splenic flexure. No evidence of bowel obstruction or significant stool burden. No free air or focal abscess identified. No hernias identified. Vascular/Lymphatic: Heavily calcified aorta without evidence of aneurysmal disease. No lymphadenopathy identified in the abdomen. Musculoskeletal: Bony structures are unremarkable. IMPRESSION: High positioned stomach in the left upper quadrant. The distal stomach is appropriate in position and anatomy should be amenable for percutaneous gastrostomy tube placement with insufflation of the  stomach. Electronically Signed   By: Aletta Edouard M.D.   On: 08/22/2015 08:20   Dg Chest Port 1 View  08/21/2015  CLINICAL DATA:  Dyspnea EXAM: PORTABLE CHEST 1 VIEW COMPARISON:  08/12/2015 chest radiograph. FINDINGS: Stable cardiomediastinal silhouette with normal heart size. No pneumothorax. No pleural effusion. Lungs appear clear, with no acute consolidative airspace disease and no pulmonary edema. IMPRESSION: No active disease. Electronically Signed   By: Ilona Sorrel M.D.   On: 08/21/2015 17:29   No results for input(s): WBC, HGB, HCT, PLT in the last 72 hours. No results for input(s): NA, K, CL, GLUCOSE, BUN, CREATININE, CALCIUM in the last 72 hours.  Invalid input(s): CO CBG (last 3)   Recent Labs  08/21/15 2122 08/22/15 0121 08/22/15 0424  GLUCAP 99 85 87    Wt Readings from Last 3 Encounters:  08/22/15 65.726 kg (144 lb 14.4 oz)  08/05/15 64.5 kg (142 lb 3.2 oz)  06/14/11 64.501 kg (142 lb 3.2 oz)    Physical Exam:  Constitutional: She appears well-developed, well nourished. NAD. HENT:  Head: Normocephalic. Atraumatic.  Eyes:  Pupils reactive to light  Neck: Normal range of motion. Neck supple. No thyromegaly present.  Cardiovascular: Normal rate and regular rhythm. + systolic Murmur Respiratory: Effort normal and breath sounds normal. No respiratory distress. no wheezes or rales GI: Soft. Bowel sounds are normal. She exhibits no distension.  Neurological:  Very alert. Makes good eye contact. Easily distracted.  DTRs 3+ right upper and right lower extremity Little reaction to pinch right arm/leg. Motor:  Right upper extremity and right lower extremity  0/5 Left upper and left lower extremity ~ 3+/5 or more (patient not participating in MMT) + Babinski bilaterally.  Flexor tone RUE---2/4, tr to1/4 RLE Skin: Skin is warm and dry.   Assessment/Plan: 1. Right hemiparesis and cognitive deficits  secondary to left basal ganglia ICH which require 3+ hours per  day of interdisciplinary therapy in a comprehensive inpatient rehab setting. Physiatrist is providing close team supervision and 24 hour management of active medical problems listed below. Physiatrist and rehab team continue to assess barriers to discharge/monitor patient progress toward functional and medical goals.  Function:  Bathing Bathing position Bathing activity did not occur: N/A (evening bath) Position: Bed  Bathing parts Body parts bathed by patient: Left upper leg, Abdomen Body parts bathed by helper: Right arm, Left arm, Chest, Front perineal area, Right upper leg, Right lower leg, Left lower leg, Back  Bathing assist Assist Level:  (Total assist)      Upper Body Dressing/Undressing Upper body dressing   What is the patient wearing?: Pull over shirt/dress     Pull over shirt/dress - Perfomed by patient: Put head through opening, Thread/unthread left sleeve Pull over shirt/dress - Perfomed by helper: Thread/unthread right sleeve, Pull shirt over trunk        Upper body assist Assist Level:  (Max Assist)      Lower Body Dressing/Undressing Lower body dressing Lower body dressing/undressing activity did not occur: N/A What is the patient wearing?: Pants, Ted Hose, Non-skid slipper socks     Pants- Performed by patient: Thread/unthread left pants leg Pants- Performed by helper: Thread/unthread right pants leg, Pull pants up/down, Fasten/unfasten pants   Non-skid slipper socks- Performed by helper: Don/doff right sock, Don/doff left sock   Socks - Performed by helper: Don/doff right sock, Don/doff left sock   Shoes - Performed by helper: Don/doff right shoe, Don/doff left shoe, Fasten right, Fasten left       TED Hose - Performed by helper: Don/doff right TED hose, Don/doff left TED hose  Lower body assist Assist for lower body dressing: 2 Helpers      Naval architect activity did not occur: Safety/medical concerns   Toileting steps completed by  helper: Adjust clothing prior to toileting, Adjust clothing after toileting, Performs perineal hygiene Toileting Assistive Devices: Grab bar or rail  Toileting assist Assist level: Touching or steadying assistance (Pt.75%)   Transfers Chair/bed transfer   Chair/bed transfer method: Squat pivot Chair/bed transfer assist level: 2 helpers Chair/bed transfer assistive device: Armrests Mechanical lift: Chiropractor Ambulation activity did not occur: Safety/medical concerns   Max distance: 25 Assist level: Maximal assist (Pt 25 - 49%)   Wheelchair   Type: Manual (TIS)   Assist Level: Dependent (Pt equals 0%)  Cognition Comprehension Comprehension assist level: Understands basic 50 - 74% of the time/ requires cueing 25 - 49% of the time  Expression Expression assist level: Expresses basic 50 - 74% of the time/requires cueing 25 - 49% of the time. Needs to repeat parts of sentences.  Social Interaction Social Interaction assist level: Interacts appropriately 25 - 49% of time - Needs frequent redirection.  Problem Solving Problem solving assist level: Solves basic 50 - 74% of the time/requires cueing 25 - 49% of the time  Memory Memory assist level: Recognizes or recalls 50 - 74% of the time/requires cueing 25 - 49% of the time   Medical Problem List and Plan: 1. 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 DVT Prophylaxis/Anticoagulation: Subcutaneous heparin  3. Pain Management: Hydrocodone as needed 4. Dysphagia. Dysphagia #2 honey thick liquids.  -continue IV fluids for hydration-  hs only     -family decided that they wanted  a PEG. Process initiated. Consult pending. Placement today?  -added megace to help with any appetite component  5. Neuropsych: This patient is not capable of making decisions on her own behalf. 6. Skin/Wound Care: Routine skin checks,    7. Decreased  nutritional storage. HS IVF  -PO minimal  -will need enteral feedings for the short term. See above     8. Fluids/Electrolytes/Nutrition: following lytes,   -continue replete potassium 9. Seizure prophylaxis. Keppra held for somnolence EEG neg, Hold per neuro 10. Leukocytosis. Follow-up urine study/follow-up CBC 11. Hypertension. Cozaar 100 mg daily. Monitor with increased mobility 12. Hyperlipidemia. Lipitor 13. Lethargy post stroke: arousal can still wax and wane but more alert as a whole  -continue ritalin at 10mg   -keppra stopped by neurology,    14. Low grade temp resolved  -CXR clear again yesterday. I independently reviewed the patient's images today.  15. Spasticity: PRAFO RLE  -continue ROM/modalities  -  16. Loose stool: improved with cessation of TF  -stopped cholestyramine for now   -continue probiotic   LOS (Days) 11 A FACE TO FACE EVALUATION WAS PERFORMED  Rudolph Daoust T 08/22/2015 9:05 AM

## 2015-08-22 NOTE — Progress Notes (Signed)
Occupational Therapy Session Note  Patient Details  Name: [name] MRN: [id number] Date of Birth: 1940/12/28  Today's Date: 08/22/2015 OT Individual Time: 0800-0900 OT Individual Time Calculation (min): 60 min    Short Term Goals: Week 2:  OT Short Term Goal 1 (Week 2): Pt will participate in self care task for 2 minutes in order to decrease level of assist with self care. OT Short Term Goal 2 (Week 2): Pt will locate 3 items to the R during self care tasks by pointing to them in order to demonstrate visual scanning with mod cues OT Short Term Goal 3 (Week 2): Pt will perform rolls to the left and right with max A or less during LB clothing management and hygiene in order to decrease burden of care  Skilled Therapeutic Interventions/Progress Updates:    Pt resting in bed upon arrival, alert and responsive to therapy commands/demands.  Pt initially engaged in self feeding requiring max encouragement to eat.  Pt ate approx 25% of her meal and 5% of her orange juice. Pt transitioned to dressing tasks seated EOB and required max A for supine>sit EOB.  Pt was able to maintain static sitting balance with steady A/close supervision.  Pt uncooperative with learning hemi dressing techniques and ignore therapist commands to stop to thread RUE/RLE into shirt sleeve/pants leg before threading her LUE/LLE.  Pt was slightly impulsive during dressing tasks.  Pt required max A for sit<>stand and standing balance to pull up pants.  Pt performed squat pivot transfer to w/c with max A.  Pt engaged in grooming tasks while seated in w/c at sink.  Pt alert, cooperative, and participatory throughout session.   Therapy Documentation Precautions:  Precautions Precautions: Fall Precaution Comments: R hemiplegia, R neglect, nonverbal Restrictions Weight Bearing Restrictions: No  Pain:  No s/s of pain  See Function Navigator for Current Functional Status.   Therapy/Group: Individual Therapy  [name] 08/22/2015, 9:03 AM

## 2015-08-22 NOTE — Progress Notes (Signed)
Nutrition Follow-up  DOCUMENTATION CODES:   Severe malnutrition in context of acute illness/injury  INTERVENTION:  Plans for PEG placement, date of placement currently unknown, however once PEG is ready for use recommend Jevity 1.5 formula at 35 ml/hr and increase by 10 ml every 4 hours to goal rate of 55 ml/hr x 20 hours (may hold for up to 4 hours for therapy) to provide 1650 kcal (100% of needs), 70 grams of protein, and 836 ml of free water.   For now, continue Ensure Enlive po TID (thickened to appropriate consistency), each supplement provides 350 kcal and 20 grams of protein.  RD to continue to monitor.   NUTRITION DIAGNOSIS:   Malnutrition related to acute illness as evidenced by energy intake < or equal to 50% for > or equal to 5 days, moderate depletions of muscle mass; ongoing  GOAL:   Patient will meet greater than or equal to 90% of their needs; not met  MONITOR:   PO intake, Diet advancement, TF tolerance, Weight trends, Labs, I & O's  REASON FOR ASSESSMENT:   Consult Assessment of nutrition requirement/status  ASSESSMENT:   75 y.o. right handed female with history of hypertension. 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  Per RN note, family has decided on PEG placement for a more consistent means of nutrition and medication administration. Date of PEG placement currently unknown. Tube feeding recommendation for post PEG placement have been stated above. Meal completion today while on a dysphagia 1 diet with honey thick liquids has been 20%. Pt currently has Ensure ordered and has been consuming them. RD to continue with current orders to aid in caloric and protein needs. RD to continue to monitor.  Diet Order:  DIET - DYS 1 Room service appropriate?: Yes; Fluid consistency:: Honey Thick  Skin:  Reviewed, no issues  Last BM:  5/2  Height:   Ht Readings from  Last 1 Encounters:  08/01/15 5' 4"  (1.626 m)    Weight:   Wt Readings from Last 1 Encounters:  08/22/15 144 lb 14.4 oz (65.726 kg)    Ideal Body Weight:  54.5 kg  BMI:  Body mass index is 24.86 kg/(m^2).  Estimated Nutritional Needs:   Kcal:  1650-1850  Protein:  70-85 grams  Fluid:  1.6 - 1.8 L/day  EDUCATION NEEDS:   No education needs identified at this time  Corrin Parker, MS, RD, LDN Pager # [phone] After hours/ weekend pager # [phone]

## 2015-08-22 NOTE — Progress Notes (Signed)
No word on whether or not patient will have PEG placed today.  Called IR to see if she is on their schedule.  RN doubtful that it will be done today.  Patient ate breakfast this morning.  Held Heparin yesterday and will today as well just in case it can be done today.  Will make patient NPO for now as well.  If we haven't heard from IR that she will be done by lunch today, will resume diet and heparin for VTE.  Will continue to monitor.  Brita Romp, RN

## 2015-08-22 NOTE — Progress Notes (Signed)
Physical Therapy Session Note  Patient Details  Name: [name] MRN: [id number] Date of Birth: Aug 18, 1940  Today's Date: 08/22/2015 PT Individual Time: 1100-1200 and 1400-1430 PT Individual Time Calculation (min): 60 min and 30 min  Short Term Goals: Week 2:  PT Short Term Goal 1 (Week 2): Patient will maintain arousal for 30 min consistently with min multimodal cues. PT Short Term Goal 2 (Week 2): Patient will follow basic 1 step commands in 75% of available opportunities.  PT Short Term Goal 3 (Week 2): Patient will maintain sitting balance x 5 min with supervision.  PT Short Term Goal 4 (Week 2): Patient will stand with use of equipment x 5 min.   Skilled Therapeutic Interventions/Progress Updates:   Treatment 1: Session focused on progression of functional mobility, RLE NMR, wheelchair seating and positioning, wheelchair propulsion, command following, sustained attention, and activity tolerance. Patient alert in [place] wheelchair, husband and son departing. Patient communicated throughout session appropriately with waves, head nods/shakes, blowing kiss to son, and making eye contact/winking but only verbalized counting aloud with self-recognition of errors, able to correct 80% of time. Patient impulsive with sit > stand throughout session requiring verbal cues for safety awareness. Performed sit <> stand from wheelchair using arm rest with min A. Standing at stairs for LUE support, performed step taps to 6" step using LLE to facilitate RLE neuro re-ed via forced use with max A and max verbal cues for upright posture, two trials to fatigue. Patient able to accept good weight through RLE but unable to maintain R knee extension in stance without assist. Gait training with mirror for visual feedback using rail in hallway for LUE support with mod A overall and assist to advance/place RLE with max multimodal cues for upright posture and forward gaze x 25 ft and +2 for wheelchair follow progressed to  gait training using L hemiwalker with max A overall x 20 ft with assist to advance/place RLE as well as therapist providing assistance to safely advance hemiwalker as patient would only move it forward short distances, +2 wheelchair follow. Vitals after seated rest BP 127/67, HR 79, Sp02 98%. Performed squat pivot transfers to level surface wheelchair <> mat table with mod A and verbal cues for sequencing/technique. Patient switched to 16 x 16 manual wheelchair and instructed in wheelchair propulsion via L hemi technique with brief HOH assist to initiate propulsion on wheelchair rim and supervision-min A overall for obstacle negotiation 2 x 100 ft. In room sitting in wheelchair, patient able to doff B yellow gripper socks and don shoes by crossing each LE over knee, using LUE to assist RLE with setup/supervision and assist to tie laces. Patient left sitting in wheelchair with quick release belt donned and RUE supported on pillow at RN station.   Treatment 2: Patient in manual wheelchair with son departing at beginning of session. Patient propelled wheelchair on rehab unit via L hemi technique with supervision and max verbal/tactile/visual cues for obstacle negotiation and attention to R side of environment. Provided patient with 1/2 lap tray for RUE support. Patient negotiated up/down 4 (6") stairs with max A, max multimodal cues for step-to sequencing and safety and assist to place/stabilize RLE with cues for upright posture and weight shift to facilitate midline positioning due to heavy lean R. Patient required total A to turn at bottom of stairs and safely sit in wheelchair due to fatigue and impulsive behavior. Patient left sitting in wheelchair with quick release belt on and RUE supported on pillow/tray  table at RN station.   Therapy Documentation Precautions:  Precautions Precautions: Fall Precaution Comments: R hemiplegia, R neglect, nonverbal Restrictions Weight Bearing Restrictions:  No Pain: Pain Assessment Pain Assessment: Faces Pain Score: 0-No pain Faces Pain Scale: No hurt   See Function Navigator for Current Functional Status.   Therapy/Group: Individual Therapy  [name] 08/22/2015, 12:17 PM

## 2015-08-23 ENCOUNTER — Inpatient Hospital Stay (HOSPITAL_COMMUNITY): Payer: Medicare Other | Admitting: Speech Pathology

## 2015-08-23 ENCOUNTER — Inpatient Hospital Stay (HOSPITAL_COMMUNITY): Payer: Medicare Other

## 2015-08-23 ENCOUNTER — Inpatient Hospital Stay (HOSPITAL_COMMUNITY): Payer: Medicare Other | Admitting: Occupational Therapy

## 2015-08-23 LAB — GLUCOSE, CAPILLARY
GLUCOSE-CAPILLARY: 83 mg/dL (ref 65–99)
Glucose-Capillary: 103 mg/dL — ABNORMAL HIGH (ref 65–99)
Glucose-Capillary: 105 mg/dL — ABNORMAL HIGH (ref 65–99)
Glucose-Capillary: 108 mg/dL — ABNORMAL HIGH (ref 65–99)
Glucose-Capillary: 113 mg/dL — ABNORMAL HIGH (ref 65–99)
Glucose-Capillary: 97 mg/dL (ref 65–99)

## 2015-08-23 NOTE — Progress Notes (Signed)
Occupational Therapy Session Note  Patient Details  Name: [name] MRN: [id number] Date of Birth: [birth year]  Today's Date: 08/23/2015 OT Individual Time: [id number] OT Individual Time Calculation (min): 60 min    Short Term Goals: Week 1:  OT Short Term Goal 1 (Week 1): Pt will participate in self care task for 2 minutes  in order to decrease level of assist with self care.  OT Short Term Goal 1 - Progress (Week 1): Progressing toward goal OT Short Term Goal 2 (Week 1): Pt will locate 3 items to the R during self care tasks by pointing to them in order to demonstrate visual scanning with mod cues. OT Short Term Goal 2 - Progress (Week 1): Progressing toward goal OT Short Term Goal 3 (Week 1): Pt will perform rolls to the left and right with max A or less during LB clothing management and hygiene n order to decrease level of care. OT Short Term Goal 3 - Progress (Week 1): Progressing toward goal  Skilled Therapeutic Interventions/Progress Updates:    Pt asleep in bed upon arrival requiring max cues and extended time to increase alertness. Pt incontinent of brief therefore OT completed hygiene and LB dressing at bed level with total A. Pt required max A supine>sit demonstrating improved eye opening and overall alertness. Donned shirt sitting EOB with min-max A for dynamic sitting balance. Pt demonstrated static sitting balance up to 30 seconds with close SBA. Completed squat pivot transfer bed>w/c with mod A. Positioned pt at sink and she demonstrated immediate alertness. Completed grooming tasks of face washing, applying lotion, and combing hair with min assist. Pt left in w/c with NT present.    Therapy Documentation Precautions:  Precautions Precautions: Fall Precaution Comments: R hemiplegia, R neglect, nonverbal Restrictions Weight Bearing Restrictions: No General:   Vital Signs:  Pain: Pain Assessment Pain Assessment: Faces Faces Pain Scale: No hurt  See Function  Navigator for Current Functional Status.   Therapy/Group: Individual Therapy  Duayne Cal 08/23/2015, 12:01 PM

## 2015-08-23 NOTE — Progress Notes (Signed)
Speech Language Pathology Daily Session Note  Patient Details  Name: [name] MRN: [id number] Date of Birth: 17-Apr-1941  Today's Date: 08/23/2015 SLP Individual Time: [id number] SLP Individual Time Calculation (min): 45 min  Short Term Goals: Week 2: SLP Short Term Goal 1 (Week 2): Patient will consume current diet without overt s/s of aspiration with Mod A verbal cues for use of swallowing compensatory strategies.  SLP Short Term Goal 2 (Week 2): Patient will answer basic yes/no questions utilizing multimodal communication with 75% accuracy with Max A multimodal cues.  SLP Short Term Goal 3 (Week 2): Patient will follow basic 1 step commands in 75% of opporunities with Mod A multimodal cues.  SLP Short Term Goal 4 (Week 2): Patient will maintain arousal in regards to keeping her eyes open for 30 minutes with Max A multimodal cues over 2 consecutive sessions.  SLP Short Term Goal 5 (Week 2): Patient will vocalize with Max A multimodal cues in 20% of opportunities  SLP Short Term Goal 6 (Week 2): Patient will name functional items at the word level with Max a multimodal cues in 20% of opportunities.   Skilled Therapeutic Interventions: Skilled treatment session focused on cognitive-linguistic goals. Pt was awake and alert sitting in her wheelchair when SLP entered room. Pt was able to maintained arousal for full session (45 minutes) without any cues. SLP further facilitated session by providing Mod A verbal cues for matching a written word to object field of two with 90% accuracy. Pt was not able to name any objects but did imitate "rest" spontaneously and pointed to numbers on calendar and counted independently thru 5. Pt with 100% accuracy when answering simple yes/no questions independently using gestures. Pt left at nursing station in her wheelchair with safety belt in place. Continue current plan of care.   Function:  Eating Eating   Modified Consistency Diet: Yes Eating Assist  Level: Helper feeds patient   Eating Set Up Assist For: Opening containers;Cutting food Helper [place] on Utensil: Every scoop Helper Brings Food to Mouth: Every scoop   Cognition Comprehension Comprehension assist level: Understands basic 50 - 74% of the time/ requires cueing 25 - 49% of the time  Expression   Expression assist level: Expresses basic 25 - 49% of the time/requires cueing 50 - 75% of the time. Uses single words/gestures.  Social Interaction Social Interaction assist level: Interacts appropriately 50 - 74% of the time - May be physically or verbally inappropriate.  Problem Solving Problem solving assist level: Solves basic 25 - 49% of the time - needs direction more than half the time to initiate, plan or complete simple activities  Memory Memory assist level: Recognizes or recalls 50 - 74% of the time/requires cueing 25 - 49% of the time    Pain Pain Assessment Pain Assessment: Faces Faces Pain Scale: No hurt  Therapy/Group: Individual Therapy  Ramiyah Mcclenahan 08/23/2015, 11:54 AM

## 2015-08-23 NOTE — Plan of Care (Signed)
Problem: RH BLADDER ELIMINATION Goal: RH STG MANAGE BLADDER WITH ASSISTANCE STG Manage Bladder With max Assistance  Outcome: Not Progressing Incontinent at HS

## 2015-08-23 NOTE — Progress Notes (Signed)
Occupational Therapy Session Note  Patient Details  Name: [name] MRN: [id number] Date of Birth: [birth year]  Today's Date: 08/23/2015 OT Individual Time: 1300-1400 OT Individual Time Calculation (min): 60 min    Short Term Goals: Week 2:  OT Short Term Goal 1 (Week 2): Pt will participate in self care task for 2 minutes in order to decrease level of assist with self care. OT Short Term Goal 2 (Week 2): Pt will locate 3 items to the R during self care tasks by pointing to them in order to demonstrate visual scanning with mod cues OT Short Term Goal 3 (Week 2): Pt will perform rolls to the left and right with max A or less during LB clothing management and hygiene in order to decrease burden of care  Skilled Therapeutic Interventions/Progress Updates:    Treatment session with focus on Rt attention, initiation, and sitting balance.  Pt received from [employer] with continued cues to engage in self-feeding with pt only talking one more bite of food and 5-7 more sips of Ensure.  Engaged in [place] to address initiation and Rt attention and visual scanning.  Pt demonstrating improved initiation with repetition of activity, improving from average reaction time of 2.79 to 2.26 and then 2.11 seconds.  Utilized Mode B to further challenge reaction time and Rt attention, with pt correctly selecting 56/63 in 3.0 second time limit with all 7 missed in upper Rt quadrant.  Engaged in card sorting task, as pt's daughter-in-law present and reports that pt enjoys Bridge and other card games.  Mod- faded to min cues for sorting by shape and then color.  Pt self initiating a pattern by placing the cards in order by number.  Pt fidgeting and reaching towards back and when asked if wanting to return to bed pt's eyes widened in confirmation.  Squat pivot transfer back to bed with max assist, improved initiation with cues for hand placement.  Pt left in bed with daughter in law present.  Therapy  Documentation Precautions:  Precautions Precautions: Fall Precaution Comments: R hemiplegia, R neglect, nonverbal Restrictions Weight Bearing Restrictions: No General:   Vital Signs: Therapy Vitals Temp: 98.6 F (37 C) Temp Source: Oral Pulse Rate: 73 Resp: 18 BP: 124/65 mmHg Patient Position (if appropriate): Lying Oxygen Therapy SpO2: 100 % O2 Device: Not Delivered Pain:   No c/o pain per faces scale  See Function Navigator for Current Functional Status.   Therapy/Group: Individual Therapy  Simonne Come 08/23/2015, 2:59 PM

## 2015-08-23 NOTE — Progress Notes (Signed)
[facility] [facility]     PROGRESS NOTE    Subjective/Complaints: Pt sitting up in bed this AM. She is alert, but just stares.     ROS remains limited due to cognitive status  Objective: Vital Signs: Blood pressure 146/70, pulse 73, temperature 97.8 F (36.6 C), temperature source Oral, resp. rate 18, weight 57.6 kg (126 lb 15.8 oz), SpO2 100 %. Ct Abdomen Wo Contrast  08/22/2015  CLINICAL DATA:  Stroke and need for gastrostomy tube for nutrition. CT evaluation of anatomy prior to potential percutaneous gastrostomy tube placement. EXAM: CT ABDOMEN WITHOUT CONTRAST TECHNIQUE: Multidetector CT imaging of the abdomen was performed following the standard protocol without IV contrast. COMPARISON:  01/09/2013 FINDINGS: Lower chest:  Scarring and atelectasis at the right lung base. Hepatobiliary: Unenhanced appearance of the liver and gallbladder are unremarkable. Pancreas: Unenhanced appearance of the pancreas is unremarkable. Spleen: Unenhanced appearance of the spleen is unremarkable. Adrenals/Urinary Tract: No evidence of hydronephrosis or renal calculi. Stomach/Bowel: The stomach is located high in the left upper subphrenic abdomen. The distal stomach is appropriately positioned and anatomy should be amenable for percutaneous gastrostomy tube placement with insufflation of the stomach. The distal stomach is located superior to the transverse colon and anterior to the splenic flexure. No evidence of bowel obstruction or significant stool burden. No free air or focal abscess identified. No hernias identified. Vascular/Lymphatic: Heavily calcified aorta without evidence of aneurysmal disease. No lymphadenopathy identified in the abdomen. Musculoskeletal: Bony structures are unremarkable. IMPRESSION: High positioned stomach in the left upper quadrant. The distal stomach is appropriate in position and anatomy should be amenable for percutaneous gastrostomy tube placement with  insufflation of the stomach. Electronically Signed   By: Aletta Edouard M.D.   On: 08/22/2015 08:20   Dg Chest Port 1 View  08/21/2015  CLINICAL DATA:  Dyspnea EXAM: PORTABLE CHEST 1 VIEW COMPARISON:  08/12/2015 chest radiograph. FINDINGS: Stable cardiomediastinal silhouette with normal heart size. No pneumothorax. No pleural effusion. Lungs appear clear, with no acute consolidative airspace disease and no pulmonary edema. IMPRESSION: No active disease. Electronically Signed   By: Ilona Sorrel M.D.   On: 08/21/2015 17:29   No results for input(s): WBC, HGB, HCT, PLT in the last 72 hours. No results for input(s): NA, K, CL, GLUCOSE, BUN, CREATININE, CALCIUM in the last 72 hours.  Invalid input(s): CO CBG (last 3)   Recent Labs  08/22/15 2344 08/23/15 0346 08/23/15 0751  GLUCAP 103* 113* 105*    Wt Readings from Last 3 Encounters:  08/23/15 57.6 kg (126 lb 15.8 oz)  08/05/15 64.5 kg (142 lb 3.2 oz)  06/14/11 64.501 kg (142 lb 3.2 oz)    Physical Exam:  Constitutional: She appears well-developed, well nourished. NAD. HENT: Normocephalic. Atraumatic.  Cardiovascular: Normal rate and regular rhythm. + systolic Murmur Respiratory: Effort normal and breath sounds normal. No respiratory distress. no wheezes or rales GI: Soft. Bowel sounds are normal. She exhibits no distension.  Neurological:  Alert. Makes good eye contact. Easily distracted.  DTRs 3+ right upper and right lower extremity Motor:  Right upper extremity and right lower extremity  0/5 Left upper and left lower extremity >/3/5 or more (patient not participating in MMT) Flexor tone RUE---2/4, tr to1/4 RLE Skin: Skin is warm and dry.  Psych: Withdrawn.  Assessment/Plan: 1. 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 Physiatrist is providing  close team supervision and 24 hour  management of active medical problems listed below. Physiatrist and rehab team continue to assess barriers to discharge/monitor patient progress toward functional and medical goals.  Function:  Bathing Bathing position Bathing activity did not occur: N/A (evening bath) Position: Bed  Bathing parts Body parts bathed by patient: Left upper leg, Abdomen Body parts bathed by helper: Right arm, Left arm, Chest, Front perineal area, Right upper leg, Right lower leg, Left lower leg, Back  Bathing assist Assist Level:  (Total assist)      Upper Body Dressing/Undressing Upper body dressing   What is the patient wearing?: Pull over shirt/dress     Pull over shirt/dress - Perfomed by patient: Put head through opening, Thread/unthread left sleeve Pull over shirt/dress - Perfomed by helper: Thread/unthread right sleeve, Pull shirt over trunk        Upper body assist Assist Level:  (mod assist)      Lower Body Dressing/Undressing Lower body dressing Lower body dressing/undressing activity did not occur: N/A What is the patient wearing?: Pants, Ted Hose, Non-skid slipper socks     Pants- Performed by patient: Thread/unthread left pants leg Pants- Performed by helper: Thread/unthread left pants leg, Pull pants up/down, Thread/unthread right pants leg   Non-skid slipper socks- Performed by helper: Don/doff right sock, Don/doff left sock   Socks - Performed by helper: Don/doff right sock, Don/doff left sock   Shoes - Performed by helper: Don/doff right shoe, Don/doff left shoe, Fasten right, Fasten left       TED Hose - Performed by helper: Don/doff right TED hose, Don/doff left TED hose  Lower body assist Assist for lower body dressing:  (total assist of 1)      Toileting Toileting Toileting activity did not occur: Safety/medical concerns Toileting steps completed by patient: Performs perineal hygiene Toileting steps completed by helper: Adjust clothing prior to toileting, Adjust  clothing after toileting Toileting Assistive Devices: Grab bar or rail  Toileting assist Assist level: Two helpers   Transfers Chair/bed transfer   Chair/bed transfer method: Squat pivot Chair/bed transfer assist level: Maximal assist (Pt 25 - 49%/lift and lower) Chair/bed transfer assistive device: Armrests Mechanical lift: Maximove   Locomotion Ambulation Ambulation activity did not occur: Safety/medical concerns   Max distance: 25 Assist level: 2 helpers   Wheelchair   Type: Manual Max wheelchair distance: 100 Assist Level: Touching or steadying assistance (Pt > 75%)  Cognition Comprehension Comprehension assist level: Understands basic 50 - 74% of the time/ requires cueing 25 - 49% of the time  Expression Expression assist level: Expresses basic 25 - 49% of the time/requires cueing 50 - 75% of the time. Uses single words/gestures.  Social Interaction Social Interaction assist level: Interacts appropriately 25 - 49% of time - Needs frequent redirection.  Problem Solving Problem solving assist level: Solves basic 50 - 74% of the time/requires cueing 25 - 49% of the time  Memory Memory assist level: Recognizes or recalls 50 - 74% of the time/requires cueing 25 - 49% of the time   Medical Problem List and Plan: 1. 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 DVT Prophylaxis/Anticoagulation: Subcutaneous heparin  3. Pain Management: Hydrocodone as needed 4. Dysphagia. Dysphagia #2 honey thick liquids.  -continue IV fluids for hydration-  hs only     -family decided that they wanted a PEG. Process initiated. Consult pending.  -added megace to help with any appetite component  5. Neuropsych: This patient is not capable of making decisions on her own behalf. 6. Skin/Wound Care: Routine skin checks,    7. Decreased nutritional storage. HS IVF  -PO minimal  -See above 8.  Fluids/Electrolytes/Nutrition: following lytes,   -continue replete potassium 9. Seizure prophylaxis. Keppra held for somnolence EEG neg, Hold per neuro 10. Leukocytosis. Follow-up urine study/follow-up CBC 11. Hypertension. Cozaar 100 mg daily. Monitor with increased mobility 12. Hyperlipidemia. Lipitor 13. Lethargy post stroke: arousal can still wax and wane but more alert as a whole  -continue ritalin at 10mg   -keppra stopped by neurology 14. Low grade temp resolved  -CXR clear again.   15. Spasticity: PRAFO RLE  -continue ROM/modalities 16. Loose stool: improved with cessation of TF  -stopped cholestyramine for now   -continue probiotic   LOS (Days) 12 A FACE TO FACE EVALUATION WAS PERFORMED  Curtis Cain Lorie Phenix 08/23/2015 11:19 AM

## 2015-08-24 ENCOUNTER — Inpatient Hospital Stay (HOSPITAL_COMMUNITY): Payer: Medicare Other | Admitting: Physical Therapy

## 2015-08-24 ENCOUNTER — Inpatient Hospital Stay (HOSPITAL_COMMUNITY): Payer: Medicare Other | Admitting: Occupational Therapy

## 2015-08-24 DIAGNOSIS — R030 Elevated blood-pressure reading, without diagnosis of hypertension: Secondary | ICD-10-CM

## 2015-08-24 DIAGNOSIS — R0989 Other specified symptoms and signs involving the circulatory and respiratory systems: Secondary | ICD-10-CM | POA: Insufficient documentation

## 2015-08-24 LAB — GLUCOSE, CAPILLARY
Glucose-Capillary: 81 mg/dL (ref 65–99)
Glucose-Capillary: 88 mg/dL (ref 65–99)
Glucose-Capillary: 82 mg/dL (ref 65–99)
Glucose-Capillary: 90 mg/dL (ref 65–99)

## 2015-08-24 MED ORDER — CLINDAMYCIN PHOSPHATE 600 MG/50ML IV SOLN
600.0000 mg | INTRAVENOUS | Status: AC
  Administered 2015-08-25: 600 mg via INTRAVENOUS
  Filled 2015-08-24: qty 50

## 2015-08-24 NOTE — Progress Notes (Signed)
Occupational Therapy Session Note  Patient Details  Name: [name] MRN: [id number] Date of Birth: [birth year]  Today's Date: 08/24/2015 OT Individual Time: [id number] OT Individual Time Calculation (min): 30 min    Short Term Goals: Week 2:  OT Short Term Goal 1 (Week 2): Pt will participate in self care task for 2 minutes in order to decrease level of assist with self care. OT Short Term Goal 2 (Week 2): Pt will locate 3 items to the R during self care tasks by pointing to them in order to demonstrate visual scanning with mod cues OT Short Term Goal 3 (Week 2): Pt will perform rolls to the left and right with max A or less during LB clothing management and hygiene in order to decrease burden of care  Skilled Therapeutic Interventions/Progress Updates:    1:1 Pt in bed when arrived with husband and dtr at bed side. Pt awake and alert and with therapist presence pt initiated coming to EOB without a prompt. Mod VC for reciprocal scooting with min A to come to EOB. Mod A squat pivot to the left into w/c.  Pt transfer on and off the toilet with max A stand pivot with total A for clothing management. Pt did have a wet brief but was able to void urine on toilet. Pt performed hygiene with setup and initially hand over hand to wipe due to apraxia but able to complete on her own.  Therapist wash her from her incontinent brief. Returned to sink to wash bilateral hands with total A to attend to right hand to wash and dry it. Pt participated in picking out her clothing demonstrating sustained attention to pick out clothing with directional cues to attend to right field. Pt able to pick out a shirt and pants. REturned to sink and pt initiated doffing shirt with min instructional cues with extra time. Pt perform sit to stand with left UE on the sink with min A and mod A to maintain balance with right knee supported for LB clothing management. Pt left in chair with family at her side.  Therapy  Documentation Precautions:  Precautions Precautions: Fall Precaution Comments: R hemiplegia, R neglect, nonverbal Restrictions Weight Bearing Restrictions: No Pain: Pain Assessment Pain Assessment: No/denies pain  See Function Navigator for Current Functional Status.   Therapy/Group: Individual Therapy  Willeen Cass [facility] 08/24/2015, 3:30 PM

## 2015-08-24 NOTE — Consult Note (Signed)
Chief Complaint: Patient was seen in consultation today for percutaneous gastrostomy tube placement  Referring Physician(s): Swartz,Zachary  Supervising Physician: Daryll Brod  Patient Status: In-pt   History of Present Illness: [name] is a 75 y.o. 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 Previous NG tubes have been pulled out by patient. Request now received for percutaneous gastrostomy tube placement.  Past Medical History  Diagnosis Date  . Bronchitis, not specified as acute or chronic   . Other chronic sinusitis   . Other chronic allergic conjunctivitis   . Allergic rhinitis, cause unspecified   . 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 Acute renal insufficiency     due to amphotericin 2012  . Irritable bowel   . Hypertension   . Diverticulosis     Past Surgical History  Procedure Laterality Date  . Cesarean section      x 4  . Vesicovaginal fistula closure w/ tah    . Breast lumpectomy      Right-benign  . Tonsillectomy    . Appendectomy    . Bronchoscopy      Video bronch w/ TBBX 2012    Allergies: Penicillins  Medications: Prior to Admission medications   Medication Sig Start Date End Date Taking? Authorizing Provider  atorvastatin (LIPITOR) 20 MG tablet Take 20 mg by mouth at bedtime.  05/07/15  Yes Historical Provider, MD  bimatoprost (LUMIGAN) 0.01 % SOLN Place 1 drop into both eyes at bedtime.   Yes Historical Provider, MD  cetirizine (ZYRTEC) 10 MG tablet Take 10 mg by mouth daily as needed (seasonal allergies).   Yes Historical Provider, MD  estrogens, conjugated, (PREMARIN) 0.3 MG tablet Take 0.3 mg by mouth every other day.    Yes Historical Provider, MD  fluticasone (FLONASE) 50 MCG/ACT nasal spray Place 1 spray into both nostrils daily as  needed (seasonal allergies).   Yes Historical Provider, MD  losartan (COZAAR) 100 MG tablet Take 100 mg by mouth at bedtime.  05/07/15  Yes Historical Provider, MD  Olopatadine HCl (PATADAY) 0.2 % SOLN Place 1 drop into both eyes every 12 (twelve) hours as needed (seasonal allergies).   Yes Historical Provider, MD  Polyethyl Glycol-Propyl Glycol (SYSTANE OP) Place 1 drop into both eyes daily as needed (dry eyes).   Yes Historical Provider, MD  Probiotic Product (ALIGN) 4 MG CAPS Take 4 mg by mouth 2 (two) times daily.   Yes Historical Provider, MD  HYDROcodone-acetaminophen (NORCO) 5-325 MG per tablet Take 1 tablet by mouth every 4 (four) hours as needed for pain. Patient not taking: Reported on 08/02/2015 01/09/13   Orlie Dakin, MD  ondansetron (ZOFRAN) 8 MG tablet Take 1 tablet (8 mg total) by mouth every 8 (eight) hours as needed for nausea. Patient not taking: Reported on 08/02/2015 01/09/13   Orlie Dakin, MD     Family History  Problem Relation Age of Onset  . Aortic aneurysm Father   . Heart failure Mother     Social History   Social History  . Marital Status: Married    Spouse Name: Dr. Genelle Bal  . Number of Children: N/A  . Years of Education: N/A   Social History Main Topics  . Smoking status: Never Smoker   . Smokeless tobacco: Never Used  . Alcohol Use: Yes  .  Drug Use: Not on file  . Sexual Activity: Not on file   Other Topics Concern  . Not on file   Social History Narrative      Review of Systems limited due to cognitive status  Vital Signs: BP 154/76 mmHg  Pulse 67  Temp(Src) 98.6 F (37 C) (Oral)  Resp 18  Wt 128 lb 1.4 oz (58.1 kg)  SpO2 98%  Physical Exam patient awake but only intermittently opens eyes. Chest with clear breath sounds bilaterally. Heart with regular rate and rhythm. Positive murmur. Abdomen soft, positive bowel sounds, nontender. Lower extremities with no edema. Patient does follow a few commands. Noted right hemiparesis;   left upper/lower extremity motor 3/5  Mallampati Score:     Imaging: Ct Abdomen Wo Contrast  08/22/2015  CLINICAL DATA:  Stroke and need for gastrostomy tube for nutrition. CT evaluation of anatomy prior to potential percutaneous gastrostomy tube placement. EXAM: CT ABDOMEN WITHOUT CONTRAST TECHNIQUE: Multidetector CT imaging of the abdomen was performed following the standard protocol without IV contrast. COMPARISON:  01/09/2013 FINDINGS: Lower chest:  Scarring and atelectasis at the right lung base. Hepatobiliary: Unenhanced appearance of the liver and gallbladder are unremarkable. Pancreas: Unenhanced appearance of the pancreas is unremarkable. Spleen: Unenhanced appearance of the spleen is unremarkable. Adrenals/Urinary Tract: No evidence of hydronephrosis or renal calculi. Stomach/Bowel: The stomach is located high in the left upper subphrenic abdomen. The distal stomach is appropriately positioned and anatomy should be amenable for percutaneous gastrostomy tube placement with insufflation of the stomach. The distal stomach is located superior to the transverse colon and anterior to the splenic flexure. No evidence of bowel obstruction or significant stool burden. No free air or focal abscess identified. No hernias identified. Vascular/Lymphatic: Heavily calcified aorta without evidence of aneurysmal disease. No lymphadenopathy identified in the abdomen. Musculoskeletal: Bony structures are unremarkable. IMPRESSION: High positioned stomach in the left upper quadrant. The distal stomach is appropriate in position and anatomy should be amenable for percutaneous gastrostomy tube placement with insufflation of the stomach. Electronically Signed   By: Aletta Edouard M.D.   On: 08/22/2015 08:20   Dg Chest 1 View  08/12/2015  CLINICAL DATA:  Fever. EXAM: CHEST 1 VIEW COMPARISON:  08/09/2015 FINDINGS: There is a feeding tube which is looped within the stomach. Normal heart size. No pleural effusion or edema  identified. No airspace consolidation noted. IMPRESSION: 1. No evidence for pneumonia Electronically Signed   By: Kerby Moors M.D.   On: 08/12/2015 11:43   Ct Head Wo Contrast  08/14/2015  CLINICAL DATA:  Intracranial hemorrhage, somnolent EXAM: CT HEAD WITHOUT CONTRAST TECHNIQUE: Contiguous axial images were obtained from the base of the skull through the vertex without intravenous contrast. COMPARISON:  08/08/2015 FINDINGS: Intraparenchymal hemorrhage in the left basal ganglia measures 4.1 x 3.2 cm (estimated hemorrhage volume 26 mL), previously 5.0 x 3.5 cm (estimated volume 35 mL). Surrounding vasogenic edema. 7 mm rightward midline shift (series 2/ image 14), previously 4 mm. Mild effacement of the left lateral ventricle. No intraventricular hemorrhage. Basal cisterns remain patent.  No downward herniation. No extra-axial fluid collection. No CT evidence of acute infarction. Subcortical white matter and periventricular small vessel ischemic changes. Intracranial atherosclerosis. Visualized paranasal sinuses are essentially clear. Partial opacification of the bilateral mastoid air cells, left greater than right. Mild global cortical atrophy.  No ventriculomegaly. No evidence of calvarial fracture. IMPRESSION: 4.1 x 3.2 cm intraparenchymal hemorrhage centered in the left basal ganglia (estimated hemorrhage volume 26 mL), decreased. Surrounding  vasogenic edema with 7 mm rightward midline shift, previously 4 mm. Basilar cisterns remain patent. Electronically Signed   By: Julian Hy M.D.   On: 08/14/2015 11:23   Ct Head Wo Contrast  08/08/2015  CLINICAL DATA:  75 year old female with acute left basal ganglia intracranial hemorrhage, now unresponsive. Initial encounter. EXAM: CT HEAD WITHOUT CONTRAST TECHNIQUE: Contiguous axial images were obtained from the base of the skull through the vertex without intravenous contrast. COMPARISON:  08/06/2015 and earlier. FINDINGS: Stable paranasal sinus  inflammation. Mild mastoid effusions. Small volume retained secretions in the nasopharynx. No acute osseous abnormality identified. No acute orbit or scalp soft tissue finding. 50 x 35 mm (AP by transverse) hyperdense hemorrhage in the left hemisphere with epicenter at the lentiform nuclei re- demonstrated. Estimated hemorrhage volume 35 mL. This is stable to slightly increased in size since 08/06/2015. Surrounding edema and regional mass effect are stable. Mass effect on the left lateral ventricle with no intraventricular extension or ventriculomegaly. Mild rightward midline shift of 4 mm is stable. Basilar cisterns remain patent. Stable gray-white matter differentiation elsewhere. No new intracranial hemorrhage or new cortically based infarct identified. Calcified atherosclerosis at the skull base. IMPRESSION: 1. Left hemisphere hyperdense intra-axial hemorrhage is stable to slightly increased since 08/06/2015. Estimated hemorrhage volume 35 mL. 2. Surrounding edema and regional mass effect are stable, including mild rightward midline shift of 4 mm. 3. No new intracranial abnormality. Electronically Signed   By: Genevie Ann M.D.   On: 08/08/2015 13:27   Ct Head Wo Contrast  08/06/2015  CLINICAL DATA:  Increased lethargy. Followup basal ganglia hemorrhage EXAM: CT HEAD WITHOUT CONTRAST TECHNIQUE: Contiguous axial images were obtained from the base of the skull through the vertex without intravenous contrast. COMPARISON:  07/26/2015 FINDINGS: Skull and Sinuses:Negative for fracture or destructive process. Bilateral sinusitis with retained secretions, stable. Small mastoid effusions. Visualized orbits: Right cataract resection.  No acute finding Brain: Size stable parenchymal hematoma centered in the left caudate, with dissection into the neighboring white matter tracts. As before, the hematoma by CT measures up to 48 mm axially. Rim of vasogenic type edema is stable from prior. Midline shift is stable at 5 mm. No  superimposed infarct or ventricular obstruction. IMPRESSION: Unchanged left basal ganglia hematoma with vasogenic edema and midline shift. No new abnormality. Electronically Signed   By: Monte Fantasia M.D.   On: 08/06/2015 12:19   Ct Head Wo Contrast  08/05/2015  CLINICAL DATA:  Intracranial hemorrhage followup EXAM: CT HEAD WITHOUT CONTRAST TECHNIQUE: Contiguous axial images were obtained from the base of the skull through the vertex without intravenous contrast. COMPARISON:  08/03/2015 FINDINGS: Left basal ganglia hemorrhage unchanged measuring 49 x 27 mm. Surrounding low-density also unchanged. No new area of hemorrhage Mass-effect on the left lateral ventricle unchanged. Mild midline shift to the right unchanged. Mild atrophy. Mucosal edema in the paranasal sinuses. IMPRESSION: Left basal ganglia hematoma unchanged. There is mass-effect on the ventricular system with mild midline shift unchanged. No new area of hemorrhage or infarction. Electronically Signed   By: Franchot Gallo M.D.   On: 08/05/2015 10:24   Ct Head Wo Contrast  08/03/2015  ADDENDUM REPORT: 08/03/2015 20:35 ADDENDUM: Although the hemorrhage has increased since 08/01/2015, it has not significantly changed in size since the MRI yesterday. Midline shift is slightly worse. Electronically Signed   By: San Morelle M.D.   On: 08/03/2015 20:35  08/03/2015  CLINICAL DATA:  Mental status change.  Left basal ganglia hemorrhage EXAM: CT HEAD  WITHOUT CONTRAST TECHNIQUE: Contiguous axial images were obtained from the base of the skull through the vertex without intravenous contrast. COMPARISON:  MRI brain and MRA head 08/02/2015. CT head without contrast 08/01/2015 FINDINGS: The left basal ganglia hemorrhage demonstrates slight interval increase in size since the CT head 2 days ago. Maximal axial dimensions are now 5.1 x 3.5 cm. This compares with 3.9 x 2.8 cm previously. The lesions spans 4.5 cm cephalo caudad compared with 3.5 cm  previously. Surrounding vasogenic edema is present. There is mass effect with midline shift measuring 3.5 mm at the foramen of Monro. The sulci are effaced on the left as before. There is progressive effacement of the left lateral ventricle. There is no hydrocephalus. No new areas of hemorrhage are present. No significant extra-axial hemorrhage is present. Fluid levels are present in the left sphenoid sinus and right maxillary sinus. There is diffuse mucosal thickening throughout the ethmoid air cells bilaterally. The globes and orbits are intact. The skullbase is within normal limits. No significant extracranial soft tissue lesions are evident. IMPRESSION: 1. Increasing size of left basal ganglia hemorrhage, now measuring 5.1 x 3.5 x 4.5 cm. 2. Increasing surrounding vasogenic edema and mass effect with progressive effacement of the left lateral ventricle and midline shift now measuring 3.5 mm. 3. Diffuse sinus disease. Electronically Signed: By: San Morelle M.D. On: 08/03/2015 20:30   Ct Head Wo Contrast  08/01/2015  CLINICAL DATA:  Right-sided weakness and facial droop. EXAM: CT HEAD WITHOUT CONTRAST TECHNIQUE: Contiguous axial images were obtained from the base of the skull through the vertex without intravenous contrast. COMPARISON:  Paranasal sinus CT dated 09/07/2004. FINDINGS: Left basal ganglia hemorrhage with some surrounding low density. The hemorrhage measures 3.9 x 2.8 cm on image 16. No midline shift. There is some effacement of the sulci on the left. There are some streak artifacts due to patient motion with no definite subarachnoid hemorrhage identified. No intraventricular hemorrhage. The ventricles are normal in size and position. Small amount of patchy white matter low density in both cerebral hemispheres. There are motion artifacts on multiple images with bilateral ethmoid and frontal sinus mucosal thickening, left maxillary sinus mucosal thickening and right maxillary sinus air-fluid  level. The visualized bones are grossly unremarkable. IMPRESSION: 1. Left basal ganglia hemorrhagic infarct. 2. Mild chronic small vessel white matter ischemic changes in both cerebral hemispheres. 3. Chronic bilateral frontal, bilateral ethmoid and left maxillary sinusitis. 4. Acute right maxillary sinusitis. These results were called by telephone at the time of interpretation on 08/01/2015 at 8:14 pm to Dr. Nicole Kindred, who verbally acknowledged these results. Electronically Signed   By: Claudie Revering M.D.   On: 08/01/2015 20:17   Mr Jodene Nam Head Wo Contrast  08/03/2015  CLINICAL DATA:  Initial evaluation for acute intracranial hemorrhage. EXAM: MRA HEAD WITHOUT CONTRAST TECHNIQUE: Angiographic images of the Circle of Willis were obtained using MRA technique without intravenous contrast. COMPARISON:  Concomitant MRI of the brain as well as prior CT from 08/01/2015. FINDINGS: ANTERIOR CIRCULATION: Study degraded by motion artifact. Visualized distal cervical segments of the internal carotid arteries are patent with antegrade flow. Tortuosity the distal cervical left ICA noted. Petrous segments widely patent. The cavernous and supraclinoid left ICA are widely patent without flow-limiting stenosis. Cavernous and supraclinoid right ICA somewhat tortuous. 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 No associated aneurysm. A1 segments patent. Anterior communicating artery normal. Anterior  cerebral arteries patent at their proximal and mid aspect, not well evaluated distally. Probable short-segment moderate stenosis at the origin of the left M1 segment. Focal mild stenosis within the mid right M1 segment. M1 segments otherwise patent. MCA bifurcations within normal limits. Distal MCA branches grossly symmetric, but not well evaluated on this motion degraded exam. No abnormality seen underlying the left  basal ganglia hemorrhage. POSTERIOR CIRCULATION: Right vertebral artery is dominant and patent to the vertebrobasilar junction. Diminutive left vertebral artery largely terminates in PICA. Posterior inferior cerebral arteries patent. The basilar artery is markedly diminutive, and largely terminates at the takeoff of the superior cerebellar arteries. There is an ascending branch extending from the left superior cerebellar artery, which may reflect the diminutive basilar artery. This vessel anastomosis with the persistent trigeminal artery. The left PCA is supplied via the persistent trigeminal artery. There is a fetal type right PCA supplied via a right posterior communicating artery. PCAs are opacified to their distal aspects. No aneurysm or vascular malformation within the intracranial circulation. IMPRESSION: 1. No vascular abnormality or aneurysm underlying the left basal ganglia hemorrhage identified. 2. No large or proximal arterial branch occlusion. Short-segment stenoses at the proximal left M1 and mid right M1 segments. 3. 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 Electronically Signed   By: Jeannine Boga M.D.   On: 08/03/2015 00:41   Mr Jeri Cos [id number] Contrast  08/02/2015  CLINICAL DATA:  Initial valuation for acute intracranial hemorrhage. EXAM: MRI HEAD WITHOUT AND WITH CONTRAST TECHNIQUE: Multiplanar, multiecho pulse sequences of the brain and surrounding structures were obtained without and with intravenous contrast. CONTRAST:  34mL MULTIHANCE GADOBENATE DIMEGLUMINE 529 MG/ML IV SOLN COMPARISON:  Prior CT from 08/01/2015. FINDINGS: Cerebral volume within normal limits for patient age. Patchy T2/FLAIR hyperintensity within the periventricular and deep white matter both cerebral hemispheres most compatible with chronic small vessel ischemic disease, mild  in nature. Previously identified large parenchymal hemorrhage centered at the left basal ganglia is increased in size measuring 5.6 x 3.3 x 3.6 cm (estimated volume 33 mL). There is localized mass effect and vasogenic edema with partial effacement of the left lateral ventricle. Associated left-to-right shift measures up to 6 mm. There is intraventricular extension with small amount of blood layering within the occipital horns of both lateral ventricles. No hydrocephalus. No evidence for ventricular trapping. Basilar cisterns remain patent. No other foci of restricted diffusion to suggest acute infarct. Major intracranial vascular flow voids are preserved. No other acute or chronic intracranial hemorrhage. No other areas of chronic infarction identified. No mass lesion identified. No abnormal enhancement underlying the intraparenchymal hemorrhage or elsewhere within the brain. No extra-axial fluid collection. Craniocervical junction normal. Visualized upper cervical spine unremarkable. Pituitary gland normal. No acute abnormality about the orbits. Sequela prior lens extraction present on the right. Scattered mucosal thickening throughout the paranasal sinuses with fluid levels present within the right maxilla sinus and left sphenoid sinus. Fluid layering within nasopharynx. Small left mastoid effusion. Patient is suspected to be intubated. Bone marrow signal intensity within normal limits. No scalp soft tissue abnormality. IMPRESSION: 1. Increased size of acute intraparenchymal hemorrhage centered at the left basal ganglia, now measuring 5.6 x 3.3 x 3.6 cm (estimated volume 33 mL). Associated localized vasogenic edema and mass effect with up to 6 mm of left-to-right shift. Intraventricular extension with small volume blood layering in the occipital horns of  both lateral ventricles. No hydrocephalus or evidence of ventricular trapping. 2. No other acute intracranial abnormality. 3. Mild chronic small vessel ischemic  disease. Electronically Signed   By: Jeannine Boga M.D.   On: 08/02/2015 23:42   Dg Chest Port 1 View  08/21/2015  CLINICAL DATA:  Dyspnea EXAM: PORTABLE CHEST 1 VIEW COMPARISON:  08/12/2015 chest radiograph. FINDINGS: Stable cardiomediastinal silhouette with normal heart size. No pneumothorax. No pleural effusion. Lungs appear clear, with no acute consolidative airspace disease and no pulmonary edema. IMPRESSION: No active disease. Electronically Signed   By: Ilona Sorrel M.D.   On: 08/21/2015 17:29   Dg Chest Port 1 View  08/09/2015  CLINICAL DATA:  Leukocytosis. EXAM: PORTABLE CHEST 1 VIEW COMPARISON:  08/06/2015 FINDINGS: The heart size and mediastinal contours are within normal limits. Both lungs are clear. No pleural effusion or pneumothorax. The visualized skeletal structures are unremarkable. IMPRESSION: No active disease. Electronically Signed   By: Lajean Manes M.D.   On: 08/09/2015 09:40   Dg Chest Port 1 View  08/06/2015  CLINICAL DATA:  Intercerebral hemorrhage. History of eosinophilic pneumonia. EXAM: PORTABLE CHEST 1 VIEW COMPARISON:  Chest x-ray 08/01/2015. FINDINGS: The heart is mildly enlarged but stable. Moderate tortuosity of the thoracic aorta with atherosclerotic calcifications. Chronic bronchitic type interstitial lung changes along with low lung volumes and vascular crowding and streaky atelectasis. No definite infiltrates or effusions. The bony thorax is intact. IMPRESSION: Low lung volumes with vascular crowding atelectasis. Underlying chronic bronchitic lung changes. Electronically Signed   By: Marijo Sanes M.D.   On: 08/06/2015 14:10   Chest Port 1 View  08/01/2015  CLINICAL DATA:  CVA. EXAM: PORTABLE CHEST 1 VIEW COMPARISON:  06/14/2011. FINDINGS: Trachea is midline. Heart size normal. Mild accentuation of the mediastinal contours may be due to low lung volumes and AP technique. Lungs are clear. No pleural fluid. IMPRESSION: 1. No acute findings. 2. Accentuation of  the mediastinal contours may be due to AP technique and low lung volumes. Consider follow-up PA and lateral views of the chest in further evaluation, as clinically indicated. Electronically Signed   By: Lorin Picket M.D.   On: 08/01/2015 22:04   Dg Abd Portable 1v  08/18/2015  CLINICAL DATA:  Feeding tube placement. EXAM: PORTABLE ABDOMEN - 1 VIEW COMPARISON:  08/16/2015. FINDINGS: Feeding tube terminates in the distal duodenum, approaching the ligament of Treitz. Bowel gas pattern is unremarkable. Visualized lung bases are clear. IMPRESSION: Feeding tube is in the distal duodenum. Electronically Signed   By: Lorin Picket M.D.   On: 08/18/2015 12:37   Dg Abd Portable 1v  08/16/2015  CLINICAL DATA:  Encounter for feeding tube placement. EXAM: PORTABLE ABDOMEN - 1 VIEW COMPARISON:  August 02, 2015. FINDINGS: The bowel gas pattern is normal. Distal tip of feeding tube is seen in expected position of distal stomach or proximal duodenum. No definite calculi are noted. IMPRESSION: Distal tip of feeding tube seen in expected position of distal stomach or proximal duodenum. Electronically Signed   By: Marijo Conception, M.D.   On: 08/16/2015 16:10   Dg Abd Portable 1v  08/02/2015  CLINICAL DATA:  75 year old female undergoing feeding tube placement EXAM: PORTABLE ABDOMEN - 1 VIEW COMPARISON:  Prior CT abdomen/ pelvis 01/09/2013 FINDINGS: A weighted tip enteric feeding tube has been placed. The tip of the tube overlies the distal descending duodenum. The bowel gas pattern is not obstructed. No acute osseous abnormality. IMPRESSION: The tip of the feeding tube overlies the  distal descending duodenum. Electronically Signed   By: Jacqulynn Cadet M.D.   On: 08/02/2015 12:43   Dg Swallowing Func-speech Pathology  08/04/2015  Objective Swallowing Evaluation: Type of Study: MBS-Modified Barium Swallow Study Patient Details Name: [name] MRN: [id number] Date of Birth: 09-Sep-1940 Today's Date: 08/04/2015 Time:  SLP Start Time (ACUTE ONLY): 1341-SLP Stop Time (ACUTE ONLY): 1357 SLP Time Calculation (min) (ACUTE ONLY): 16 min Past Medical History: Past Medical History Diagnosis Date . Bronchitis, not specified as acute or chronic  . Other chronic sinusitis  . Other chronic allergic conjunctivitis  . Allergic rhinitis, cause unspecified  . 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 Acute renal insufficiency    due to amphotericin 2012 . Irritable bowel  . Hypertension  . Diverticulosis  Past Surgical History: Past Surgical History Procedure Laterality Date . Cesarean section     x 4 . Vesicovaginal fistula closure w/ tah   . Breast lumpectomy     Right-benign . Tonsillectomy   . Appendectomy   . Bronchoscopy     Video bronch w/ TBBX 2012 HPI: SELESTINE HOTELLING is an 75 y.o. female with a history of hypertension, brought to the emergency room in code stroke status following acute onset of right-sided weakness and speech difficulty.  She has no previous history of stroke or TIA. CT scan of her head with a large left basal ganglia hemorrhage. There is localized mass effect and No Data Recorded Assessment / Plan / Recommendation CHL IP CLINICAL IMPRESSIONS 08/04/2015 Therapy Diagnosis Mild pharyngeal phase dysphagia;Moderate pharyngeal phase dysphagia;Mild oral phase dysphagia Clinical Impression MBS complete. Pt exhibited mild-mod pharyngeal phase dysphagia characterized by sensory deficits. Delayed swallow initiation to the vallecula observed across consistencies, with silent penetration to the vocal cords during intake of nectar thick liquids. Due to language deficits/apraxia, max verbal and visual cues were ineffective to elicit cough to clear penetrates. Mild right sided labial residue noted without pt awareness which may result in pocketing due to weakness/apraxia. Pt educated re: results of MBS and diet recommendation of honey thick liquids and Dysphagia 2 (fine chop) textures, with meds crushed in puree. Scan of  esophagus noted stasis in mid esophagus (SLP cannot diagnose impairments below the UES), pt may benefit from esophageal workup. Recommend use of esophageal precautions (sit upright during intake, remain upright at least 30 minutes after intake, alternate solids and liquids). SLP will continue to follow to provide dysphagia tx. Impact on safety and function Moderate aspiration risk   CHL IP TREATMENT RECOMMENDATION 08/04/2015 Treatment Recommendations Therapy as outlined in treatment plan below   Prognosis 08/04/2015 Prognosis for Safe Diet Advancement Good Barriers to Reach Goals Language deficits Barriers/Prognosis Comment -- CHL IP DIET RECOMMENDATION 08/04/2015 SLP Diet Recommendations Dysphagia 2 (Fine chop) solids;Honey thick liquids Liquid Administration via Cup;No straw Medication Administration Crushed with puree Compensations Minimize environmental distractions;Slow rate;Small sips/bites;Other (Comment);Follow solids with liquid Postural Changes Seated upright at 90 degrees;Remain semi-upright after after feeds/meals (Comment)   CHL IP OTHER RECOMMENDATIONS 08/04/2015 Recommended Consults Consider esophageal assessment Oral Care Recommendations Oral care BID Other Recommendations Order thickener from pharmacy;Prohibited food (jello, ice cream, thin soups);Remove water pitcher;Have oral suction available   CHL IP FOLLOW UP RECOMMENDATIONS 08/04/2015 Follow up Recommendations Inpatient Rehab   CHL IP FREQUENCY AND DURATION 08/04/2015 Speech Therapy Frequency (ACUTE ONLY) min 2x/week Treatment Duration 2 weeks      CHL IP ORAL PHASE 08/04/2015 Oral Phase Impaired Oral - Pudding Teaspoon -- Oral - Pudding Cup --  Oral - Honey Teaspoon -- Oral - Honey Cup (No Data) Oral - Nectar Teaspoon -- Oral - Nectar Cup (No Data) Oral - Nectar Straw -- Oral - Thin Teaspoon -- Oral - Thin Cup -- Oral - Thin Straw -- Oral - Puree -- Oral - Mech Soft (No Data) Oral - Regular -- Oral - Multi-Consistency -- Oral - Pill -- Oral Phase -  Comment --  CHL IP PHARYNGEAL PHASE 08/04/2015 Pharyngeal Phase Impaired Pharyngeal- Pudding Teaspoon -- Pharyngeal -- Pharyngeal- Pudding Cup -- Pharyngeal -- Pharyngeal- Honey Teaspoon -- Pharyngeal -- Pharyngeal- Honey Cup Pharyngeal residue - valleculae;Reduced tongue base retraction;Delayed swallow initiation-vallecula Pharyngeal -- Pharyngeal- Nectar Teaspoon -- Pharyngeal -- Pharyngeal- Nectar Cup Delayed swallow initiation-vallecula;Reduced tongue base retraction;Pharyngeal residue - valleculae;Penetration/Aspiration during swallow Pharyngeal Material enters airway, CONTACTS cords and not ejected out;Material enters airway, remains ABOVE vocal cords and not ejected out Pharyngeal- Nectar Straw -- Pharyngeal -- Pharyngeal- Thin Teaspoon -- Pharyngeal -- Pharyngeal- Thin Cup -- Pharyngeal -- Pharyngeal- Thin Straw -- Pharyngeal -- Pharyngeal- Puree Delayed swallow initiation-vallecula Pharyngeal -- Pharyngeal- Mechanical Soft Delayed swallow initiation-vallecula Pharyngeal -- Pharyngeal- Regular -- Pharyngeal -- Pharyngeal- Multi-consistency -- Pharyngeal -- Pharyngeal- Pill -- Pharyngeal -- Pharyngeal Comment --  No flowsheet data found. No flowsheet data found. [place] Siren 08/04/2015, 3:29 PM Orbie Pyo Colvin Caroli.Ed CCC-SLP Pager [phone]               Labs:  CBC:  Recent Labs  08/10/15 0550 08/11/15 0354 08/12/15 0742 08/15/15 0722  WBC 11.9* 13.9* 11.9* 9.1  HGB 12.0 12.1 11.5* 10.7*  HCT 34.9* 35.3* 33.5* 32.1*  PLT 282 298 347 365    COAGS:  Recent Labs  08/01/15 2001  INR 1.08  APTT 32    BMP:  Recent Labs  08/12/15 0742 08/14/15 0545 08/15/15 0722 08/18/15 0545  NA 138 140 140 137  K 3.1* 3.0* 3.6 3.9  CL 109 105 106 101  CO2 20* 24 22 26   GLUCOSE 624THL* 135* 138* 99  BUN 11 8 8 10   CALCIUM 8.6* 8.7* 8.8* 9.3  CREATININE 0.62 0.55 0.54 0.61  GFRNONAA >60 >60 >60 >60  GFRAA >60 >60 >60 >60    LIVER FUNCTION TESTS:  Recent Labs  08/01/15 2001  08/02/15 0819 08/03/15 0311 08/12/15 0742  BILITOT 1.2 1.6* 1.6* 1.5*  AST 27 89* 83* 16  ALT 17 26 28 14   ALKPHOS 63 58 49 61  PROT 6.9 6.3* 5.7* 5.7*  ALBUMIN 4.4 3.9 3.4* 2.7*    TUMOR MARKERS: No results for input(s): AFPTM, CEA, CA199, CHROMGRNA in the last 8760 hours.  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 Request now made for percutaneous gastrostomy tube placement. Recent CT abdomen/ pelvis on 5/5 revealed high positioned stomach in the left upper quadrant but distal portion is appropriate in position and anatomy should be amenable for G-tube placement with insufflation of the stomach.Risks and benefits discussed with the patient's son/POA Anastasio Champion including, but not limited to the need for a barium enema during the procedure, bleeding, infection, peritonitis, or damage to adjacent structures. All of his questions were answered, patient/he is agreeable to proceed.Consent signed and in chart. Procedure tentatively planned for 5/8. Check am labs.     Thank you for this interesting consult.  I greatly enjoyed meeting [name] and look forward to participating in their care.  A copy of this report was sent to the requesting provider on this date.  Electronically Signed: D. Rowe Robert 08/24/2015, 10:44 AM   I spent a total of 40 minutes in face to face in clinical consultation, greater than 50% of which was counseling/coordinating care for  percutaneous gastrostomy tube placement

## 2015-08-24 NOTE — Progress Notes (Signed)
[name] son and POA in with other members of the family.  Discussed placement of the PEG tube tomorrow.  Patient's affect is tearful and depressed.  Eyes reddened from crying the previous few hours.  Patient non-verbal, but attempts to communicate.

## 2015-08-24 NOTE — Progress Notes (Signed)
[name] PHYSICAL MEDICINE & REHABILITATION     PROGRESS NOTE    Subjective/Complaints: Patient sleeping in bed this morning. She is more fatigued this morning and opens her eyes for a short period of time before drifting back to sleep.    ROS: limited due to cognitive status  Objective: Vital Signs: Blood pressure 154/76, pulse 67, temperature 98.6 F (37 C), temperature source Oral, resp. rate 18, weight 58.1 kg (128 lb 1.4 oz), SpO2 98 %. No results found. No results for input(s): WBC, HGB, HCT, PLT in the last 72 hours. No results for input(s): NA, K, CL, GLUCOSE, BUN, CREATININE, CALCIUM in the last 72 hours.  Invalid input(s): CO CBG (last 3)   Recent Labs  08/23/15 1945 08/23/15 2342 08/24/15 0409  GLUCAP 97 103* 81    Wt Readings from Last 3 Encounters:  08/24/15 58.1 kg (128 lb 1.4 oz)  08/05/15 64.5 kg (142 lb 3.2 oz)  06/14/11 64.501 kg (142 lb 3.2 oz)    Physical Exam:  Constitutional: She appears well-developed, well nourished. NAD. HENT: Normocephalic. Atraumatic.  Cardiovascular: Normal rate and regular rhythm. + systolic Murmur Respiratory: Effort normal and breath sounds normal. No respiratory distress. no wheezes or rales GI: Soft. Bowel sounds are normal. She exhibits no distension.  Neurological:  Alert. Makes good eye contact. Easily distracted.  DTRs 3+ right upper and right lower extremity Motor:  Right upper extremity and right lower extremity:  0/5 Left upper and left lower extremity >/3/5 or more (patient not participating in MMT) Flexor tone RUE---2/4, tr to1/4 RLE Skin: Skin is warm and dry.  Psych: Withdrawn.  Assessment/Plan: 1. 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 Physiatrist is providing close team supervision and 24 hour management of active medical problems listed  below. Physiatrist and rehab team continue to assess barriers to discharge/monitor patient progress toward functional and medical goals.  Function:  Bathing Bathing position Bathing activity did not occur: N/A (evening bath) Position: Bed  Bathing parts Body parts bathed by patient: Left upper leg, Abdomen Body parts bathed by helper: Right arm, Left arm, Chest, Front perineal area, Right upper leg, Right lower leg, Left lower leg, Back  Bathing assist Assist Level:  (Total assist)      Upper Body Dressing/Undressing Upper body dressing   What is the patient wearing?: Pull over shirt/dress     Pull over shirt/dress - Perfomed by patient: Put head through opening, Thread/unthread left sleeve Pull over shirt/dress - Perfomed by helper: Thread/unthread right sleeve, Pull shirt over trunk        Upper body assist Assist Level:  (mod assist)      Lower Body Dressing/Undressing Lower body dressing Lower body dressing/undressing activity did not occur: N/A What is the patient wearing?: Pants, Ted Hose, Non-skid slipper socks     Pants- Performed by patient: Thread/unthread left pants leg Pants- Performed by helper: Thread/unthread left pants leg, Pull pants up/down, Thread/unthread right pants leg   Non-skid slipper socks- Performed by helper: Don/doff right sock, Don/doff left sock   Socks - Performed by helper: Don/doff right sock, Don/doff left sock   Shoes - Performed by helper: Don/doff right shoe, Don/doff left shoe, Fasten right, Fasten left       TED Hose - Performed by helper: Don/doff right TED hose, Don/doff left TED hose  Lower body assist Assist for lower body dressing:  (  total assist of 1)      Toileting Toileting Toileting activity did not occur: Safety/medical concerns Toileting steps completed by patient: Performs perineal hygiene Toileting steps completed by helper: Adjust clothing prior to toileting, Adjust clothing after toileting Toileting Assistive  Devices: Grab bar or rail  Toileting assist Assist level: Two helpers   Transfers Chair/bed transfer   Chair/bed transfer method: Squat pivot Chair/bed transfer assist level: Maximal assist (Pt 25 - 49%/lift and lower) Chair/bed transfer assistive device: Armrests Mechanical lift: Maximove   Locomotion Ambulation Ambulation activity did not occur: Safety/medical concerns   Max distance: 25 Assist level: 2 helpers   Wheelchair   Type: Manual Max wheelchair distance: 100 Assist Level: Touching or steadying assistance (Pt > 75%)  Cognition Comprehension Comprehension assist level: Understands basic 50 - 74% of the time/ requires cueing 25 - 49% of the time  Expression Expression assist level: Expresses basic 50 - 74% of the time/requires cueing 25 - 49% of the time. Needs to repeat parts of sentences.  Social Interaction Social Interaction assist level: Interacts appropriately 25 - 49% of time - Needs frequent redirection.  Problem Solving Problem solving assist level: Solves basic 50 - 74% of the time/requires cueing 25 - 49% of the time  Memory Memory assist level: Recognizes or recalls 50 - 74% of the time/requires cueing 25 - 49% of the time   Medical Problem List and Plan: 1. 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 DVT Prophylaxis/Anticoagulation: Subcutaneous heparin  3. Pain Management: Hydrocodone as needed 4. Dysphagia. Dysphagia #2 honey thick liquids.  -continue IV fluids for hydration-  hs only     -family decided that they wanted a PEG. Process initiated. Consult Remains pending.  -added megace to help with any appetite component  5. Neuropsych: This patient is not capable of making decisions on her own behalf. 6. Skin/Wound Care: Routine skin checks,    7. Decreased nutritional storage. HS IVF  -PO minimal  -See above 8. Fluids/Electrolytes/Nutrition: following lytes,    -continue replete potassium 9. Seizure prophylaxis. Keppra held for somnolence EEG neg, Hold per neuro 10. Leukocytosis. Follow-up urine study/follow-up CBC 11. Hypertension. Cozaar 100 mg daily. Monitor with increased mobility  Slightly labile, however overall relatively controlled 12. Hyperlipidemia. Lipitor 13. Lethargy post stroke: arousal can still wax and wane but more alert as a whole  -continue ritalin at 10mg   -keppra stopped by neurology 14. Low grade temp resolved  -CXR clear again.   15. Spasticity: PRAFO RLE  -continue ROM/modalities 16. Loose stool: improved with cessation of TF  -stopped cholestyramine for now   -continue probiotic   LOS (Days) 13 A FACE TO FACE EVALUATION WAS PERFORMED  Ankit Lorie Phenix 08/24/2015 8:39 AM

## 2015-08-24 NOTE — Progress Notes (Signed)
Physical Therapy Session Note  Patient Details  Name: [name] MRN: [id number] Date of Birth: 12/03/1940  Today's Date: 08/24/2015 PT Individual Time: 0901-1000 and [id number] PT Individual Time Calculation (min): 59 min and 46 minutes   Skilled Therapeutic Interventions/Progress Updates:    Treatment 1: Pt received in bed, denying c/o pain & agreeable to PT. PT & rehab tech assisted pt with donning ted hose, socks & pants total A. Pt able to roll R with supervision & Min A to roll L to allow therapist to finish donning pants. Pt transferred supine>sit with use of bed features & steady A, squat pivot to TIS w/c with Max A. Gait training completed in hallway with hemiwalker & Max A from PT supporting pt's R side, providing total A for weight shifting, advancing RLE & hemiwalker. Utilized +2 from rehab tech providing close w/c follow for safety. Pt able to bear weight through RLE while advancing LLE without any R knee buckling noted. Gait training 5 ft + 23 ft & afterwards HR= 80 bpm, SpO2 = 98%, BP = 86/51 mmHg. PT discussed pt's low BP with RN who stated pt's BP had been higher than that this am. Proceeded with session, focusing on seated activities but PT observed pt had urine incontinence. Returned pt to room, squat pivot w/c>bed max A & Min A for RLE during sit>supine. Pt performed rolling in bed for hygiene care & changing brief & pants. Pt able to perform front hygiene independently. At end of session pt left in bed with all needs within reach & bed alarm set. Vitals at end of session: BP = 108/52 mmHg, HR =70 bpm, SpO2 = 97%.  Treatment 2: Pt received in w/c, agreeable to PT, denying c/o pain. Pt utilized [employer] from sitting in w/c with LUE to address R inattention. Pt with delayed reaction time to R lower quadrant but able to decrease reaction time each trial. Pt attempted to verbally communicate something with therapist but unable to understand. After multiple trials therapist able to  understand pt reporting she was wet. Returned to room & pt transferred to standing with maximum cuing to push up on w/c armrest with LUE, Min A for sit>stand transfer & Max A to stand ~2-3 minutes with hemiwalker. Pt with posterior R lean & knee buckling, requiring therapist to correct total A. Pt had not had incontinent episode. Pt transferred TIS w/c>standard w/c via stand pivot max A as pt required cuing to push from armrests, pt stood instead of squat pivot & was then unable to progress feet to move to standard w/c. Pt with Max A for stand>sit to control descent. Pt able to propel w/c with L hemi technique & maximum cuing to steer with LLE. Pt able to propel chair x 100 ft in open hallway over linear path with supervision but would then require mod A to correct as pt veered to R evn with therapist instruction for linear path. Pt returned to room & transferred w/c>bed via squat pivot max A, sit>supine with min A & pt able to scoot to head of bed with supervision. At end of session pt left in bed with alarm set & all needs within reach.    Therapy Documentation Precautions:  Precautions Precautions: Fall Precaution Comments: R hemiplegia, R neglect, nonverbal Restrictions Weight Bearing Restrictions: No Pain: Pain Assessment Pain Assessment: No/denies pain   See Function Navigator for Current Functional Status.   Therapy/Group: Individual Therapy  Waunita Schooner 08/24/2015, 12:19 PM

## 2015-08-25 ENCOUNTER — Inpatient Hospital Stay (HOSPITAL_COMMUNITY): Payer: Medicare Other | Admitting: Occupational Therapy

## 2015-08-25 ENCOUNTER — Inpatient Hospital Stay (HOSPITAL_COMMUNITY): Payer: Medicare Other | Admitting: Physical Therapy

## 2015-08-25 ENCOUNTER — Inpatient Hospital Stay (HOSPITAL_COMMUNITY): Payer: Medicare Other | Admitting: Speech Pathology

## 2015-08-25 ENCOUNTER — Inpatient Hospital Stay (HOSPITAL_COMMUNITY): Payer: Medicare Other

## 2015-08-25 LAB — BASIC METABOLIC PANEL
ANION GAP: 12 (ref 5–15)
BUN: 15 mg/dL (ref 6–20)
CALCIUM: 9.3 mg/dL (ref 8.9–10.3)
CHLORIDE: 103 mmol/L (ref 101–111)
CO2: 21 mmol/L — AB (ref 22–32)
CREATININE: 0.69 mg/dL (ref 0.44–1.00)
GFR calc non Af Amer: >60 mL/min (ref >60)
Glucose, Bld: 93 mg/dL (ref 65–99)
Potassium: 3.7 mmol/L (ref 3.5–5.1)
SODIUM: 136 mmol/L (ref 135–145)

## 2015-08-25 LAB — CBC
HCT: 36.5 % (ref 36.0–46.0)
HEMOGLOBIN: 12.1 g/dL (ref 12.0–15.0)
MCH: 29.7 pg (ref 26.0–34.0)
MCHC: 33.2 g/dL (ref 30.0–36.0)
MCV: 89.5 fL (ref 78.0–100.0)
PLATELETS: 389 10*3/uL (ref 150–400)
RBC: 4.08 MIL/uL (ref 3.87–5.11)
RDW: 13.7 % (ref 11.5–15.5)
WBC: 7.4 10*3/uL (ref 4.0–10.5)

## 2015-08-25 LAB — GLUCOSE, CAPILLARY
GLUCOSE-CAPILLARY: 115 mg/dL — AB (ref 65–99)
GLUCOSE-CAPILLARY: 71 mg/dL (ref 65–99)
GLUCOSE-CAPILLARY: 82 mg/dL (ref 65–99)
GLUCOSE-CAPILLARY: 93 mg/dL (ref 65–99)
Glucose-Capillary: 67 mg/dL (ref 65–99)
Glucose-Capillary: 81 mg/dL (ref 65–99)
Glucose-Capillary: 91 mg/dL (ref 65–99)

## 2015-08-25 LAB — PROTIME-INR
INR: 1.12 (ref 0.00–1.49)
PROTHROMBIN TIME: 14.6 s (ref 11.6–15.2)

## 2015-08-25 MED ORDER — DEXTROSE 50 % IV SOLN
INTRAVENOUS | Status: AC
Start: 2015-08-25 — End: 2015-08-25
  Filled 2015-08-25: qty 50

## 2015-08-25 MED ORDER — FENTANYL CITRATE (PF) 100 MCG/2ML IJ SOLN
INTRAMUSCULAR | Status: AC | PRN
  Administered 2015-08-25: 25 ug via INTRAVENOUS

## 2015-08-25 MED ORDER — LIDOCAINE HCL 1 % IJ SOLN
INTRAMUSCULAR | Status: AC
  Administered 2015-08-25: 5 mL
  Filled 2015-08-25: qty 20

## 2015-08-25 MED ORDER — DEXTROSE 50 % IV SOLN
1.0000 | Freq: Once | INTRAVENOUS | Status: AC
  Administered 2015-08-25: 25 mL via INTRAVENOUS

## 2015-08-25 MED ORDER — IOPAMIDOL (ISOVUE-300) INJECTION 61%
INTRAVENOUS | Status: AC
  Administered 2015-08-25: 10 mL
  Filled 2015-08-25: qty 50

## 2015-08-25 MED ORDER — GLUCAGON HCL RDNA (DIAGNOSTIC) 1 MG IJ SOLR
INTRAMUSCULAR | Status: AC | PRN
  Administered 2015-08-25: .5 mg via INTRAVENOUS

## 2015-08-25 MED ORDER — GLUCAGON HCL RDNA (DIAGNOSTIC) 1 MG IJ SOLR
INTRAMUSCULAR | Status: AC
  Filled 2015-08-25: qty 1

## 2015-08-25 MED ORDER — FENTANYL CITRATE (PF) 100 MCG/2ML IJ SOLN
INTRAMUSCULAR | Status: AC
  Filled 2015-08-25: qty 2

## 2015-08-25 MED ORDER — MIDAZOLAM HCL 2 MG/2ML IJ SOLN
INTRAMUSCULAR | Status: AC | PRN
  Administered 2015-08-25: 0.5 mg via INTRAVENOUS

## 2015-08-25 MED ORDER — MIDAZOLAM HCL 2 MG/2ML IJ SOLN
INTRAMUSCULAR | Status: AC
  Filled 2015-08-25: qty 2

## 2015-08-25 MED ORDER — SODIUM CHLORIDE 0.9 % IV SOLN
INTRAVENOUS | Status: AC | PRN
  Administered 2015-08-25: 10 mL/h via INTRAVENOUS

## 2015-08-25 NOTE — Plan of Care (Signed)
Problem: RH BOWEL ELIMINATION Goal: RH STG MANAGE BOWEL WITH ASSISTANCE STG Manage Bowel with max Assistance.  Outcome: Not Progressing No BM since 5/2- not eating well

## 2015-08-25 NOTE — Procedures (Signed)
Successful fluoro 20 fr Gtube insertion No comp Stable EBL 0 Full report in PACS

## 2015-08-25 NOTE — Progress Notes (Signed)
Physical Therapy Note  Patient Details  Name: [name] MRN: [id number] Date of Birth: 08-25-1940 Today's Date: 08/25/2015   Patient declined therapy secondary to increased fatigue secondary to G-tube placement less than 2 hours ago. Will attempt to see patient when able to participate.   Retta Diones 08/25/2015, 1:01 PM

## 2015-08-25 NOTE — Progress Notes (Signed)
SLP Cancellation Note  Patient Details Name: [name] MRN: [id number] DOB: [birth year]   Cancelled treatment:       Patient missed 30 minutes of skilled SLP intervention due to transport present to take patient to PEG procedure.                                                                                                [place], [place] 08/25/2015, 9:14 AM

## 2015-08-25 NOTE — Progress Notes (Signed)
Patient scheduled for PEG placement today.  CHG baths x2 have been administered.  Intake remains poor.  Refusing po fluids this shift.  Took medications at beginning of shift with much encouragement.  Will continue to monitor.    Fredna Dow M

## 2015-08-25 NOTE — Progress Notes (Signed)
Occupational Therapy Session Note  Patient Details  Name: [name] MRN: [id number] Date of Birth: 05/11/1940  Today's Date: 08/25/2015 OT Individual Time: 1745-1815 OT Individual Time Calculation (min): 30 min    Short Term Goals: Week 2:  OT Short Term Goal 1 (Week 2): Pt will participate in self care task for 2 minutes in order to decrease level of assist with self care. OT Short Term Goal 2 (Week 2): Pt will locate 3 items to the R during self care tasks by pointing to them in order to demonstrate visual scanning with mod cues OT Short Term Goal 3 (Week 2): Pt will perform rolls to the left and right with max A or less during LB clothing management and hygiene in order to decrease burden of care  Skilled Therapeutic Interventions/Progress Updates: After review of prior therapy notes for today and notes that indicated patient had peg tube placement earlier, this clinician approached patient gently and attempted to assess if she was in pain, wanted pain meds and felt trying to reposition in bed, ROM to increase neck rotation or R UE.   She resisted therapy except appeared to nod her head up and down in response when this clinician asked separately if her tummy, head head hurt and if she wanted pain meds.    This clinician informed RN of possible pain and possibel request for meds.  Otherwise, this clinician very gently helped patient reposition on her left side in bed as she was slid down in bed and applied pillows between legs and behind back for comfort measure.   Patient did move both her legs around in the med after this repositioning.  Then this clinican verbally and tactilely demonstrated use of call bell.   Call bell was left in what appeared to be patient's line of vision.  At end of session, son arrived to visit with his mother.   Patient was left in the care of her son     Therapy Documentation Precautions:  Precautions Precautions: Fall Precaution Comments: R hemiplegia, R  neglect, nonverbal Restrictions Weight Bearing Restrictions: No  Pain:when asked if her tummy hurt, and when asked if her head hurt, she nodded her head up and down. When asked if she wanted pain meds, she nodded her head up and down.  RN in meeting but informed of possible pain and need for meds.       See Function Navigator for Current Functional Status.   Therapy/Group: Individual Therapy  Alfredia Ferguson [facility] 08/25/2015, 8:05 PM

## 2015-08-25 NOTE — Progress Notes (Signed)
[place] PHYSICAL MEDICINE & REHABILITATION     PROGRESS NOTE    Subjective/Complaints: Patient a little more difficult to arouse today.  Ate inconsistently at best over weekend. Arousal still can wax and wane.   ROS: limited due to cognitive status  Objective: Vital Signs: Blood pressure 163/87, pulse 78, temperature 98.3 F (36.8 C), temperature source Oral, resp. rate 18, weight 62.7 kg (138 lb 3.7 oz), SpO2 100 %. No results found.  Recent Labs  08/25/15 0654  WBC 7.4  HGB 12.1  HCT 36.5  PLT 389    Recent Labs  08/25/15 0654  NA 136  K 3.7  CL 103  GLUCOSE 93  BUN 15  CREATININE 0.69  CALCIUM 9.3   CBG (last 3)   Recent Labs  08/24/15 2000 08/25/15 0015 08/25/15 0413  GLUCAP 90 93 82    Wt Readings from Last 3 Encounters:  08/25/15 62.7 kg (138 lb 3.7 oz)  08/05/15 64.5 kg (142 lb 3.2 oz)  06/14/11 64.501 kg (142 lb 3.2 oz)    Physical Exam:  Constitutional: She appears well-developed, well nourished. NAD. HENT: Normocephalic. Atraumatic.  Cardiovascular: Normal rate and regular rhythm. + systolic Murmur Respiratory: Effort normal and breath sounds normal. No respiratory distress. no wheezes or rales GI: Soft. Bowel sounds are normal. She exhibits no distension.  Neurological:  Arouses to verbal stim.  DTRs 3+ right upper and right lower extremity Motor:  Right upper extremity and right lower extremity:  0/5 Left upper and left lower extremity >/3/5 or more (patient not participating in MMT) Flexor tone RUE---2/4, tr to1/4 RLE Skin: Skin is warm and dry.  Psych: Withdrawn.  Assessment/Plan: 1. 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 Physiatrist is providing close team supervision and 24 hour management of active medical problems listed below. Physiatrist and rehab team continue to assess  barriers to discharge/monitor patient progress toward functional and medical goals.  Function:  Bathing Bathing position Bathing activity did not occur: N/A (evening bath) Position: Bed  Bathing parts Body parts bathed by patient: Left upper leg, Abdomen Body parts bathed by helper: Right arm, Left arm, Chest, Front perineal area, Right upper leg, Right lower leg, Left lower leg, Back  Bathing assist Assist Level:  (Total assist)      Upper Body Dressing/Undressing Upper body dressing   What is the patient wearing?: Pull over shirt/dress     Pull over shirt/dress - Perfomed by patient: Put head through opening, Thread/unthread left sleeve Pull over shirt/dress - Perfomed by helper: Thread/unthread right sleeve, Pull shirt over trunk        Upper body assist Assist Level:  (mod assist)      Lower Body Dressing/Undressing Lower body dressing Lower body dressing/undressing activity did not occur: N/A What is the patient wearing?: Pants, Ted Hose, Non-skid slipper socks     Pants- Performed by patient: Thread/unthread left pants leg Pants- Performed by helper: Thread/unthread left pants leg, Pull pants up/down, Thread/unthread right pants leg   Non-skid slipper socks- Performed by helper: Don/doff right sock, Don/doff left sock   Socks - Performed by helper: Don/doff right sock, Don/doff left sock   Shoes - Performed by helper: Don/doff right shoe, Don/doff left shoe, Fasten right, Fasten left       TED Hose - Performed by helper: Don/doff right TED hose, Don/doff left TED hose  Lower body assist  Assist for lower body dressing:  (total assist of 1)      Toileting Toileting Toileting activity did not occur: Safety/medical concerns Toileting steps completed by patient: Performs perineal hygiene Toileting steps completed by helper: Adjust clothing prior to toileting, Adjust clothing after toileting Toileting Assistive Devices: Grab bar or rail  Toileting assist Assist  level: Two helpers   Transfers Chair/bed transfer   Chair/bed transfer method: Stand pivot Chair/bed transfer assist level: Maximal assist (Pt 25 - 49%/lift and lower) Chair/bed transfer assistive device: Armrests Mechanical lift: Maximove   Locomotion Ambulation Ambulation activity did not occur: Safety/medical concerns   Max distance: 25 ft Assist level: Maximal assist (Pt 25 - 49%)   Wheelchair   Type: Manual Max wheelchair distance: 100 ft Assist Level: Moderate assistance (Pt 50 - 74%)  Cognition Comprehension Comprehension assist level: Understands basic 50 - 74% of the time/ requires cueing 25 - 49% of the time  Expression Expression assist level: Expresses basic 50 - 74% of the time/requires cueing 25 - 49% of the time. Needs to repeat parts of sentences.  Social Interaction Social Interaction assist level: Interacts appropriately 25 - 49% of time - Needs frequent redirection.  Problem Solving Problem solving assist level: Solves basic 50 - 74% of the time/requires cueing 25 - 49% of the time  Memory Memory assist level: Recognizes or recalls 50 - 74% of the time/requires cueing 25 - 49% of the time   Medical Problem List and Plan: 1. 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 DVT Prophylaxis/Anticoagulation: Subcutaneous heparin  3. Pain Management: Hydrocodone as needed 4. Dysphagia. Dysphagia #2 honey thick liquids.  -continue IV fluids for hydration-  hs only ---will change to H2O through PEG once we start to use it    -family decided that they wanted a PEG. For placement today.  -added megace to help with any appetite component  5. Neuropsych: This patient is not capable of making decisions on her own behalf. 6. Skin/Wound Care: Routine skin checks,    7. Decreased nutritional storage. HS IVF  -PO minimal  -See above 8. Fluids/Electrolytes/Nutrition: following lytes,    -continue replete potassium 9. Seizure prophylaxis. Keppra held for somnolence EEG neg, Hold per neuro 10. Leukocytosis. Follow-up urine study/follow-up CBC 11. Hypertension. Cozaar 100 mg daily. Monitor with increased mobility  Slightly labile, however overall relatively controlled 12. Hyperlipidemia. Lipitor 13. Lethargy post stroke: arousal can still wax and wane but trending in the right direction  -continue ritalin at 10mg   -keppra stopped by neurology 14. Low grade temp resolved  -CXR clear again.   15. Spasticity: PRAFO RLE  -continue ROM/modalities 16. Loose stool: improved with cessation of TF  -stopped cholestyramine for now   -continue probiotic   LOS (Days) 14 A FACE TO FACE EVALUATION WAS PERFORMED  SWARTZ,ZACHARY T 08/25/2015 8:55 AM

## 2015-08-25 NOTE — Progress Notes (Signed)
Occupational Therapy Session Note  Patient Details  Name: [name] MRN: [id number] Date of Birth: 1940-12-09  Today's Date: 08/25/2015 :  -   Pt misssed 60 mins of OT due to procedure.     Pt sleeping from G tube placement.  Husband present and agreed that pt could not work with OT at this time.     Short Term Goals: Week 1:  OT Short Term Goal 1 (Week 1): Pt will participate in self care task for 2 minutes  in order to decrease level of assist with self care.  OT Short Term Goal 1 - Progress (Week 1): Progressing toward goal OT Short Term Goal 2 (Week 1): Pt will locate 3 items to the R during self care tasks by pointing to them in order to demonstrate visual scanning with mod cues. OT Short Term Goal 2 - Progress (Week 1): Progressing toward goal OT Short Term Goal 3 (Week 1): Pt will perform rolls to the left and right with max A or less during LB clothing management and hygiene n order to decrease level of care. OT Short Term Goal 3 - Progress (Week 1): Progressing toward goal Week 2:  OT Short Term Goal 1 (Week 2): Pt will participate in self care task for 2 minutes in order to decrease level of assist with self care. OT Short Term Goal 2 (Week 2): Pt will locate 3 items to the R during self care tasks by pointing to them in order to demonstrate visual scanning with mod cues OT Short Term Goal 3 (Week 2): Pt will perform rolls to the left and right with max A or less during LB clothing management and hygiene in order to decrease burden of care  Skilled Therapeutic Interventions/Progress Updates:  Therapy Documentation Precautions:  Precautions Precautions: Fall Precaution Comments: R hemiplegia, R neglect, nonverbal Restrictions Weight Bearing Restrictions: No    Vital Signs: Therapy Vitals Temp: 98.3 F (36.8 C) Temp Source: Oral Pulse Rate: 78 Resp: 18 BP: (!) 163/87 mmHg Patient Position (if appropriate): Lying Oxygen Therapy SpO2: 100 % O2 Device: Not  Delivered         Therapy/Group: Individual Therapy  Lisa Roca 08/25/2015, 8:01 AM

## 2015-08-26 ENCOUNTER — Inpatient Hospital Stay (HOSPITAL_COMMUNITY): Payer: Medicare Other

## 2015-08-26 ENCOUNTER — Inpatient Hospital Stay (HOSPITAL_COMMUNITY): Payer: Medicare Other | Admitting: Physical Therapy

## 2015-08-26 ENCOUNTER — Inpatient Hospital Stay (HOSPITAL_COMMUNITY): Payer: Medicare Other | Admitting: Speech Pathology

## 2015-08-26 LAB — GLUCOSE, CAPILLARY
GLUCOSE-CAPILLARY: 133 mg/dL — AB (ref 65–99)
Glucose-Capillary: 71 mg/dL (ref 65–99)
Glucose-Capillary: 75 mg/dL (ref 65–99)
Glucose-Capillary: 83 mg/dL (ref 65–99)
Glucose-Capillary: 92 mg/dL (ref 65–99)

## 2015-08-26 MED ORDER — JEVITY 1.5 CAL/FIBER PO LIQD
1000.0000 mL | ORAL | Status: DC
  Administered 2015-08-26 – 2015-09-05 (×7): 1000 mL
  Filled 2015-08-26 (×20): qty 1000

## 2015-08-26 MED ORDER — PRO-STAT SUGAR FREE PO LIQD
30.0000 mL | Freq: Two times a day (BID) | ORAL | Status: DC
  Administered 2015-08-26 – 2015-09-12 (×31): 30 mL
  Filled 2015-08-26 (×33): qty 30

## 2015-08-26 NOTE — Progress Notes (Signed)
Social Work Patient ID: [name], female   DOB: 1940-07-15, 75 y.o.   MRN: [id number]   Lowella Curb, LCSW Social Worker Signed  Patient Care Conference 08/26/2015  3:33 PM    Expand All Collapse All   Inpatient RehabilitationTeam Conference and Plan of Care Update Date: 08/26/2015   Time: 2:35 PM     Patient Name: [name]       Medical Record Number: [id number]  Date of Birth: 27-Oct-1940 Sex: Female         Room/Bed: 4W14C/4W14C-01 Payor Info: Payor: MEDICARE / Plan: MEDICARE PART A AND B / Product Type: *No Product type* /    Admitting Diagnosis: lt bg hemm   Admit Date/Time:  08/11/2015  2:47 PM Admission Comments: No comment available   Primary Diagnosis:  <principal problem not specified> Principal Problem: <principal problem not specified>    Patient Active Problem List     Diagnosis  Date Noted   .  Labile blood pressure     .  Somnolence     .  Left Basal ganglia hemorrhage ([place])  08/11/2015   .  Hypertensive emergency  08/11/2015   .  Incontinence  08/11/2015   .  Hypokalemia  08/11/2015   .  Protein-calorie malnutrition ([place])  08/11/2015   .  Hemiplegia affecting dominant side ([place])     .  Aphasia, post-stroke     .  Dysphagia, post-stroke     .  Dysphasia, post-stroke     .  Hemiplegia, post-stroke ([place])     .  Cognitive deficit, post-stroke     .  Seizure prophylaxis     .  Leukocytosis     .  Benign essential HTN     .  HLD (hyperlipidemia)     .  Lethargy     .  Altered mental status     .  Cytotoxic brain edema ([place])  08/05/2015   .  Brain herniation ([place])  08/05/2015   .  ICH (intracerebral hemorrhage) ([place])  08/01/2015   .  Hepatitis  08/12/2010   .  Renal failure  08/12/2010   .  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  CONJUNCTIVITIS, ALLERGIC  09/02/2008   .  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  ALLERGIC RHINITIS  09/02/2008   .  BRONCHITIS  09/02/2008     Expected Discharge Date: Expected Discharge Date:  (SNF)  Team  Members Present: Physician leading conference: Dr. Alger Simons Social Worker Present: Lennart Pall, LCSW Nurse Present: Rayetta Pigg, RN PT Present: [name], PT OT Present: Roanna Epley, Verdie Mosher, OT SLP Present: Weston Anna, SLP PPS Coordinator present : Daiva Nakayama, RN, CRRN        Current Status/Progress  Goal  Weekly Team Focus   Medical     arousal better but still waxes and wanes. not taking in enough to sustain---peg placed yesterday  improve po intake, consistent arousal   see prior, titrate PEG feeds, dc ivf    Bowel/Bladder     Continent of bladder at times; LBM 5/2- not eating well  Min assist  Timed toileting; assess and treat for constipation as needed    Swallow/Nutrition/ Hydration     Dys. 1 textures with honey-thick liquids, Full supervision, PEG placement   Min A with least restrictive diet  tolerance with current diet, trials of upgraded textures and liquids    ADL's     mod/max A overall; fluctuating  arousal, mod/max verbal cues for task initiation  overall mod A  activity tolerance, task initiation, following one step commands, sitting balance, functional transfers, attention to right,    Mobility     min-mod A bed mobility, mod-max A transfers, max A gait and stairs wtih +2 for safety/WC follow  mod A wheelchair level  functional transfers, ambulation, R NMR, sitting > standing balance, command following, wheelchair seating/propulsion, safety, pt/family education   Communication     Max-Total A  Mod A  multimodal communication, naming, yes/no accuracy    Safety/Cognition/ Behavioral Observations    Max A   Min-Mod A  attention, problem solving, attention to right field of enviornment    Pain     C/o pain in abdomen from PEG placement; tylenol given   < 3  Assess and treat for pain q shift and prn   Skin     Right hip abrasion healed; bruising to abdomen from heparin shots; incision to abdomen from PEG insertion  Min assist  Assess skin q  shift and prn    Rehab Goals Patient on target to meet rehab goals: Yes *See Care Plan and progress notes for long and short-term goals.    Barriers to Discharge:  inconsistent arousal and po intake      Possible Resolutions to Barriers:   peg placed, continue to encourage normal sleep-wake patter, engage daily     Discharge Planning/Teaching Needs:   D/c plan is for pt to go to SNF [facility] at [facility] or another facility.       Team Discussion:    Peg placed this week.  Eating minimally.  Some improved alertness overall.  Still impulsive.  Is continent when awake - starting timed toileting. Currently mod/ max assist with hemi walker.  D/c plan for SNF.   Revisions to Treatment Plan:    None    Continued Need for Acute Rehabilitation Level of Care: The patient requires daily medical management by a physician with specialized training in physical medicine and rehabilitation for the following conditions: Daily direction of a multidisciplinary physical rehabilitation program to ensure safe treatment while eliciting the highest outcome that is of practical value to the patient.: Yes Daily medical management of patient stability for increased activity during participation in an intensive rehabilitation regime.: Yes Daily analysis of laboratory values and/or radiology reports with any subsequent need for medication adjustment of medical intervention for : Mood/behavior problems;Neurological problems;Nutritional problems  Yuliya Nova 08/26/2015, 3:33 PM                 Lowella Curb, LCSW Social Worker Signed  Patient Care Conference 08/20/2015 10:13 AM    Expand All Collapse All   Inpatient RehabilitationTeam Conference and Plan of Care Update Date: 08/19/2015   Time: 2:30 PM     Patient Name: [name]       Medical Record Number: [id number]  Date of Birth: January 16, 1941 Sex: Female         Room/Bed: 4W16C/4W16C-01 Payor Info: Payor: MEDICARE / Plan: MEDICARE PART A AND B / Product  Type: *No Product type* /    Admitting Diagnosis: lt bg hemm   Admit Date/Time:  08/11/2015  2:47 PM Admission Comments: No comment available   Primary Diagnosis:  <principal problem not specified> Principal Problem: <principal problem not specified>    Patient Active Problem List     Diagnosis  Date Noted   .  Somnolence     .  Left Basal ganglia  hemorrhage ([place])  08/11/2015   .  Hypertensive emergency  08/11/2015   .  Incontinence  08/11/2015   .  Hypokalemia  08/11/2015   .  Protein-calorie malnutrition ([place])  08/11/2015   .  Hemiplegia affecting dominant side ([place])     .  Aphasia, post-stroke     .  Dysphagia, post-stroke     .  Dysphasia, post-stroke     .  Hemiplegia, post-stroke ([place])     .  Cognitive deficit, post-stroke     .  Seizure prophylaxis     .  Leukocytosis     .  Benign essential HTN     .  HLD (hyperlipidemia)     .  Lethargy     .  Altered mental status     .  Cytotoxic brain edema ([place])  08/05/2015   .  Brain herniation ([place])  08/05/2015   .  ICH (intracerebral hemorrhage) ([place])  08/01/2015   .  Hepatitis  08/12/2010   .  Renal failure  08/12/2010   .  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  CONJUNCTIVITIS, ALLERGIC  09/02/2008   .  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  ALLERGIC RHINITIS  09/02/2008   .  BRONCHITIS  09/02/2008     Expected Discharge Date: Expected Discharge Date: 09/09/15  Team Members Present: Physician leading conference: Dr. Alger Simons Social Worker Present: Lennart Pall, LCSW Nurse Present: Other (comment) Genene Churn, RN) PT Present: [name], PT OT Present: Roanna Epley, [place], OT SLP Present: Weston Anna, SLP PPS Coordinator present : Daiva Nakayama, RN, CRRN        Current Status/Progress  Goal  Weekly Team Focus   Medical     waxingn and waning cognition, NGT TF's---, diarhea---changing formula. ritalin trial  improve arousal and paricipaion  nutrition,  stool consistency, arousal,  skin   Bowel/Bladder     incontinent x2 LBM:08/18/2015. loose due to TF   continent   timed toileting, monitor for regular BM    Swallow/Nutrition/ Hydration     Dys. 1 textures with honey-thick liquids, NG tube and IV for support, limited PO intake due to arousal  Min A with least restrictive diet  tolerance of current diet    ADL's     max/tot A overall; decreased arousal   overall mod A  increased arojusal, task initiation, following one step commands, sitting balance, bed mobility   Mobility     max-total A x 2  mod A wheelchair level  arousal, command following, functional transfers, sitting balance, activity tolerance, positioning, family education    Communication     Nonverbal, Total A   Mod A  arousal, multimodal communication, yes/no accuracy, object identification   Safety/Cognition/ Behavioral Observations    Total A   Min-Mod A  arousal, following commands, attention, attention to midline/right field of enviornment    Pain     no signs of pain   remain pain free   utilize faces pain scale.    Skin     MASD, Right Hip Abrasion and generalized bruising   remain free from skin breadown or infection   turn and reposition q2     Rehab Goals Rehab Goals Revised: minimal changes since last week. *See Care Plan and progress notes for long and short-term goals.    Barriers to Discharge:  see prior     Possible Resolutions to Barriers:   incrase stimulant, continue to engage, normalize day/night  Discharge Planning/Teaching Needs:   Most likely plan is for pt to d/c to a SNF either at [facility] or another facility affiliated with [facility].       Team Discussion:    Increased ritalin today but pt continues with poor arousal majority of the day.  Slightly better this afternoon with txs.  Very inconsistent presentation and MD feels this is to be expected.  Team discussing d/c care needs and concern over what husband's expectations are for longer term recovery.  Education  ongoing with spouse and family.   Revisions to Treatment Plan:    None    Continued Need for Acute Rehabilitation Level of Care: The patient requires daily medical management by a physician with specialized training in physical medicine and rehabilitation for the following conditions: Daily direction of a multidisciplinary physical rehabilitation program to ensure safe treatment while eliciting the highest outcome that is of practical value to the patient.: Yes Daily medical management of patient stability for increased activity during participation in an intensive rehabilitation regime.: Yes Daily analysis of laboratory values and/or radiology reports with any subsequent need for medication adjustment of medical intervention for : Neurological problems;Nutritional problems;Urological problems;Mood/behavior problems  York Valliant 08/20/2015, 10:13 AM

## 2015-08-26 NOTE — Progress Notes (Addendum)
Occupational Therapy Session Note  Patient Details  Name: [name] MRN: [id number] Date of Birth: 1940-08-22  Today's Date: 08/26/2015 OT Individual Time: 1115-1150 OT Individual Time Calculation (min): 35 min  Make Up session   Short Term Goals: Week 2:  OT Short Term Goal 1 (Week 2): Pt will participate in self care task for 2 minutes in order to decrease level of assist with self care. OT Short Term Goal 2 (Week 2): Pt will locate 3 items to the R during self care tasks by pointing to them in order to demonstrate visual scanning with mod cues OT Short Term Goal 3 (Week 2): Pt will perform rolls to the left and right with max A or less during LB clothing management and hygiene in order to decrease burden of care  Skilled Therapeutic Interventions/Progress Updates:    Pt seen for make up OT session focusing on attention to task, scanning, and sitting balance. Pt in supine upon arrival with son present. Pt with no s/s of distress. She transferred to EOB with min A using hospital bed functions with verbal and tactile cues for technique/ sequencing. Seated EOB, pt completed functional reaching task required to reach into all planes to give therapist "high five". She required min cuing for scanning to R side demonstrating decreased awareness of lower R quadrant though she was able to follow verbal directions for task with supervision cuing.  Attempted self care tasks of donning R shoe and brushing hair, however, pt with decreased attention to task to see task through to completion.  Pt able to maintain static sitting balance with supervision following VCs for weight shift and required min A for dynamic sitting balance.  Pt began having eyes closed during tasks with decreased initiation in tasks. She shook her head "yes" when asked if fatigued and made movements to return to supine. Assist provided for R LE into bed and pt's son assisted therapist with positioning pt back in bed. Educated pt's  son regarding A/PROM. Pt noted to have trace movement of elbow flexion in gravity eliminated position. Educated extensively to son throughout session regarding CVA, Inattention/ neglect, scanning, attention to R side, muscle grades, and activity progress. He asked questions regarding pt's prognosis, however, this was this therapist's first time working with pt and deferred questions to pt's primary therapist. [name] son very grateful for information therapist was able to provide. Pt left in supine with all needs in reach and son present.   Therapy Documentation Precautions:  Precautions Precautions: Fall Precaution Comments: R hemiplegia, R neglect, nonverbal Restrictions Weight Bearing Restrictions: No Pain: Pain Assessment Pain Assessment: No/denies pain Faces Pain Scale: No hurt  See Function Navigator for Current Functional Status.   Therapy/Group: Individual Therapy  Lewis, Zvi Duplantis C 08/26/2015, 12:21 PM

## 2015-08-26 NOTE — Progress Notes (Addendum)
Speech Language Pathology Daily Session Note  Patient Details  Name: [name] MRN: [id number] Date of Birth: November 19, 1940  Today's Date: 08/26/2015 SLP Individual Time: 0831-0900 SLP Individual Time Calculation (min): 29 min  Short Term Goals: Week 2: SLP Short Term Goal 1 (Week 2): Patient will consume current diet without overt s/s of aspiration with Mod A verbal cues for use of swallowing compensatory strategies.  SLP Short Term Goal 2 (Week 2): Patient will answer basic yes/no questions utilizing multimodal communication with 75% accuracy with Max A multimodal cues.  SLP Short Term Goal 3 (Week 2): Patient will follow basic 1 step commands in 75% of opporunities with Mod A multimodal cues.  SLP Short Term Goal 4 (Week 2): Patient will maintain arousal in regards to keeping her eyes open for 30 minutes with Max A multimodal cues over 2 consecutive sessions.  SLP Short Term Goal 5 (Week 2): Patient will vocalize with Max A multimodal cues in 20% of opportunities  SLP Short Term Goal 6 (Week 2): Patient will name functional items at the word level with Max a multimodal cues in 20% of opportunities.   Skilled Therapeutic Interventions: Skilled treatment session focused on addressing dysphagia goals. SLP provided set-up assist of breakfast tray/Dys.1 textures and honey-thick liquids with spouse providing food and seasoning preferences.  SLP then facilitated session with Min verbal and visual cues to problem solve use of utensils for self-feeding.  SLP was able to faded cues to overall Supervision assist verbal and visual cues for portion control and to self-monitor pocketing.  Patient with no observed overt s/s of aspiration.  Patient communicated end of meal by placing all items back on meal tray and pushing it away.  Spouse at bedside at end of session.  Continue with current plan of care.   Function:  Eating Eating   Modified Consistency Diet: Yes Eating Assist Level: Helper checks for  pocketed food;Help with picking up utensils;Set up assist for;Supervision or verbal cues   Eating Set Up Assist For: Opening containers       Cognition Comprehension Comprehension assist level: Understands basic 50 - 74% of the time/ requires cueing 25 - 49% of the time  Expression   Expression assist level: Expresses basic 25 - 49% of the time/requires cueing 50 - 75% of the time. Uses single words/gestures.  Social Interaction Social Interaction assist level: Interacts appropriately 75 - 89% of the time - Needs redirection for appropriate language or to initiate interaction.  Problem Solving Problem solving assist level: Solves basic 50 - 74% of the time/requires cueing 25 - 49% of the time  Memory Memory assist level: Recognizes or recalls 50 - 74% of the time/requires cueing 25 - 49% of the time    Pain Pain Assessment Pain Assessment: No/denies pain Pain Score: Asleep Faces Pain Scale: No hurt  Therapy/Group: Individual Therapy  Carmelia Roller., [place] [id number]  [place] 08/26/2015, 9:17 AM

## 2015-08-26 NOTE — Progress Notes (Signed)
Occupational Therapy Session Note  Patient Details  Name: [name] MRN: [id number] Date of Birth: 07-Jul-1940  Today's Date: 08/26/2015 OT Individual Time: 0900-1000 OT Individual Time Calculation (min): 60 min    Short Term Goals: Week 2:  OT Short Term Goal 1 (Week 2): Pt will participate in self care task for 2 minutes in order to decrease level of assist with self care. OT Short Term Goal 2 (Week 2): Pt will locate 3 items to the R during self care tasks by pointing to them in order to demonstrate visual scanning with mod cues OT Short Term Goal 3 (Week 2): Pt will perform rolls to the left and right with max A or less during LB clothing management and hygiene in order to decrease burden of care  Skilled Therapeutic Interventions/Progress Updates:    Pt engaged in BADL retraining including UB bathing and dressing with sit<>stand from EOB.  Pt required max A for supine>sit EOB and occasional steady A for sitting balance.  Pt requires max verbal cues and tactile cues for awareness of RUE/RLE during bathing and dressing tasks.  Pt noted with ideational apraxia when presented with deoderant. Pt required tot A + 2 for standing balance to pull up pants.  Pt performed sit<>stand with max A and tactile cues for weight shifts to L.  Pt completed grooming tasks while seated in w/c at sink.  Pt was nonverbal throughout session.  Pt engaged in table activities with focus on following one step commands.  Pt was challenged with placing tokens on numbered discs.  Pt was unable to complete tasks but was able to repeat demonstration but not able to change task when requested.    Therapy Documentation Precautions:  Precautions Precautions: Fall Precaution Comments: R hemiplegia, R neglect, nonverbal Restrictions Weight Bearing Restrictions: No  Pain: Pain Assessment Pain Assessment: No/denies pain Faces Pain Scale: No hurt  See Function Navigator for Current Functional  Status.   Therapy/Group: Individual Therapy  [name] 08/26/2015, 10:03 AM

## 2015-08-26 NOTE — Patient Care Conference (Signed)
Inpatient RehabilitationTeam Conference and Plan of Care Update Date: 08/26/2015   Time: 2:35 PM    Patient Name: SUNNYE Chase      Medical Record Number: [id number]  Date of Birth: 1940/04/26 Sex: Female         Room/Bed: 4W14C/4W14C-01 Payor Info: Payor: MEDICARE / Plan: MEDICARE PART A AND B / Product Type: *No Product type* /    Admitting Diagnosis: lt bg hemm  Admit Date/Time:  08/11/2015  2:47 PM Admission Comments: No comment available   Primary Diagnosis:  <principal problem not specified> Principal Problem: <principal problem not specified>  Patient Active Problem List   Diagnosis Date Noted  . Labile blood pressure   . Somnolence   . 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 Hypertensive emergency 08/11/2015  . Incontinence 08/11/2015  . Hypokalemia 08/11/2015  . Protein-calorie malnutrition ([place]) 08/11/2015  . Hemiplegia affecting dominant side ([place])   . Aphasia, post-stroke   . Dysphagia, post-stroke   . Dysphasia, post-stroke   . 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 Cognitive deficit, post-stroke   . Seizure prophylaxis   . Leukocytosis   . Benign essential HTN   . HLD (hyperlipidemia)   . Lethargy   . Altered mental status   . 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 Brain herniation ([place]) 08/05/2015  . ICH (intracerebral hemorrhage) ([place]) 08/01/2015  . Hepatitis 08/12/2010  . Renal failure 08/12/2010  . 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 CONJUNCTIVITIS, ALLERGIC 09/02/2008  . 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 ALLERGIC RHINITIS 09/02/2008  . BRONCHITIS 09/02/2008    Expected Discharge Date: Expected Discharge Date:  (SNF)  Team Members Present: Physician leading conference: Dr. Alger Simons Social Worker Present: Lennart Pall, LCSW Nurse Present: Rayetta Pigg, RN PT Present: [name], PT OT Present: Roanna Epley, Verdie Mosher, OT SLP Present: Weston Anna, SLP PPS Coordinator present : Daiva Nakayama, RN, CRRN   Current Status/Progress Goal Weekly Team Focus  Medical   arousal better but still waxes and wanes. not taking in enough to sustain---peg placed yesterday  improve po intake, consistent arousal  see prior, titrate PEG feeds, dc ivf   Bowel/Bladder   Continent of bladder at times; LBM 5/2- not eating well  Min assist  Timed toileting; assess and treat for constipation as needed   Swallow/Nutrition/ Hydration   Dys. 1 textures with honey-thick liquids, Full supervision, PEG placement   Min A with least restrictive diet  tolerance with current diet, trials of upgraded textures and liquids    ADL's   mod/max A overall; fluctuating arousal, mod/max verbal cues for task initiation  overall mod A  activity tolerance, task initiation, following one step commands, sitting balance, functional transfers, attention to right,   Mobility   min-mod A bed mobility, mod-max A transfers, max A gait and stairs wtih +2 for safety/WC follow  mod A wheelchair level  functional transfers, ambulation, R NMR, sitting > standing balance, command following, wheelchair seating/propulsion, safety, pt/family education   Communication   Max-Total A  Mod A  multimodal communication, naming, yes/no accuracy    Safety/Cognition/ Behavioral Observations  Max A   Min-Mod A  attention, problem solving, attention to right field of enviornment    Pain   C/o pain in abdomen from PEG placement; tylenol given  < 3  Assess and treat for pain q shift and prn   Skin   Right hip abrasion healed; bruising to abdomen from heparin shots; incision to abdomen from PEG insertion  Min assist  Assess skin q shift and prn    Rehab Goals Patient on target to meet rehab goals: Yes *See Care Plan and progress notes for long and short-term goals.  Barriers to Discharge: inconsistent arousal and po intake    Possible Resolutions to Barriers:  peg placed, continue to encourage normal sleep-wake patter, engage daily    Discharge  Planning/Teaching Needs:  D/c plan is for pt to go to SNF either at [facility] or another facility.      Team Discussion:  Peg placed this week.  Eating minimally.  Some improved alertness overall.  Still impulsive.  Is continent when awake - starting timed toileting. Currently mod/ max assist with hemi walker.  D/c plan for SNF.  Revisions to Treatment Plan:  None   Continued Need for Acute Rehabilitation Level of Care: The patient requires daily medical management by a physician with specialized training in physical medicine and rehabilitation for the following conditions: Daily direction of a multidisciplinary physical rehabilitation program to ensure safe treatment while eliciting the highest outcome that is of practical value to the patient.: Yes Daily medical management of patient stability for increased activity during participation in an intensive rehabilitation regime.: Yes Daily analysis of laboratory values and/or radiology reports with any subsequent need for medication adjustment of medical intervention for : Mood/behavior problems;Neurological problems;Nutritional problems  [name], [place] 08/26/2015, 3:33 PM

## 2015-08-26 NOTE — Progress Notes (Signed)
[facility]IC[facility] REHABILITATION     PROGRESS NOTE    Subjective/Complaints: Had an uneventful night. G-tube placement without incident. Doesn't seem to be in pain   ROS: limited due to cognitive status  Objective: Vital Signs: Blood pressure 160/72, pulse 79, temperature 99.1 F (37.3 C), temperature source Oral, resp. rate 20, weight 60.691 kg (133 lb 12.8 oz), SpO2 100 %. Ir Gastrostomy Tube Mod Sed  08/25/2015  INDICATION: Acute left cerebral hemisphere hemorrhagic stroke, dysphagia, 8 kg a EXAM: FLUOROSCOPIC 20 FRENCH PULL-THROUGH GASTROSTOMY Date:  5/8/20175/11/2015 10:11 am Radiologist:  M. Daryll Brod, MD Guidance:  Ultrasound and fluoroscopic MEDICATIONS: Clindamycin 600 mg IV; Antibiotics were administered within 1 hour of the procedure. Glucagon 1 mg IV ANESTHESIA/SEDATION: Versed 0.5 mg IV; Fentanyl 25 mcg IV Moderate Sedation Time:  15 The patient was continuously monitored during the procedure by the interventional radiology nurse under my direct supervision. CONTRAST:  10 cc Omnipaque 300 - administered into the gastric lumen. FLUOROSCOPY TIME:  Fluoroscopy Time: 2 minutes 0 seconds (3 mGy). COMPLICATIONS: None immediate. PROCEDURE: Informed consent was obtained from the patient following explanation of the procedure, risks, benefits and alternatives. The patient understands, agrees and consents for the procedure. All questions were addressed. A time out was performed. Maximal barrier sterile technique utilized including caps, mask, sterile gowns, sterile gloves, large sterile drape, hand hygiene, and betadine prep. The left upper quadrant was sterilely prepped and draped. An oral gastric catheter was inserted into the stomach under fluoroscopy. The existing nasogastric feeding tube was removed. Air was injected into the stomach for insufflation and visualization under fluoroscopy. The air distended stomach was confirmed beneath the anterior abdominal wall in the frontal  and lateral projections. Under sterile conditions and local anesthesia, a 55 gauge trocar needle was utilized to access the stomach percutaneously beneath the left subcostal margin. Needle position was confirmed within the stomach under biplane fluoroscopy. Contrast injection confirmed position also. A single T tack was deployed for gastropexy. Over an Amplatz guide wire, a 9-French sheath was inserted into the stomach. A snare device was utilized to capture the oral gastric catheter. The snare device was pulled retrograde from the stomach up the esophagus and out the oropharynx. The 20-French pull-through gastrostomy was connected to the snare device and pulled antegrade through the oropharynx down the esophagus into the stomach and then through the percutaneous tract external to the patient. The gastrostomy was assembled externally. Contrast injection confirms position in the stomach. Images were obtained for documentation. The patient tolerated procedure well. No immediate complication. IMPRESSION: Fluoroscopic insertion of a 20-French "pull-through" gastrostomy. Electronically Signed   By: Jerilynn Mages.  Shick M.D.   On: 08/25/2015 10:22    Recent Labs  08/25/15 0654  WBC 7.4  HGB 12.1  HCT 36.5  PLT 389    Recent Labs  08/25/15 0654  NA 136  K 3.7  CL 103  GLUCOSE 93  BUN 15  CREATININE 0.69  CALCIUM 9.3   CBG (last 3)   Recent Labs  08/25/15 2350 08/26/15 0411 08/26/15 0704  GLUCAP 81 71 75    Wt Readings from Last 3 Encounters:  08/26/15 60.691 kg (133 lb 12.8 oz)  08/05/15 64.5 kg (142 lb 3.2 oz)  06/14/11 64.501 kg (142 lb 3.2 oz)    Physical Exam:  Constitutional: She appears well-developed, well nourished. NAD. HENT: Normocephalic. Atraumatic.  Cardiovascular: Normal rate and regular rhythm. + systolic Murmur Respiratory: Effort normal and breath sounds normal. No respiratory distress.  no wheezes or rales GI: Soft. Bowel sounds are normal. gtube site appears clean,  minimal drainage  Neurological:  Arouses to verbal stim again. Seems to try to mouth words. Makes good eye contact.  DTRs 3+ right upper and right lower extremity Motor:  Right upper extremity and right lower extremity:  0/5 Left upper and left lower extremity >/3/5 or more (patient not participating in MMT) Flexor tone RUE---2/4, tr to1/4 RLE Skin: Skin is warm and dry.  Psych: Withdrawn.  Assessment/Plan: 1. 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 Physiatrist is providing close team supervision and 24 hour management of active medical problems listed below. Physiatrist and rehab team continue to assess barriers to discharge/monitor patient progress toward functional and medical goals.  Function:  Bathing Bathing position Bathing activity did not occur: N/A (evening bath) Position: Bed  Bathing parts Body parts bathed by patient: Left upper leg, Abdomen Body parts bathed by helper: Right arm, Left arm, Chest, Front perineal area, Right upper leg, Right lower leg, Left lower leg, Back  Bathing assist Assist Level:  (Total assist)      Upper Body Dressing/Undressing Upper body dressing   What is the patient wearing?: Pull over shirt/dress     Pull over shirt/dress - Perfomed by patient: Put head through opening, Thread/unthread left sleeve Pull over shirt/dress - Perfomed by helper: Thread/unthread right sleeve, Pull shirt over trunk        Upper body assist Assist Level:  (mod assist)      Lower Body Dressing/Undressing Lower body dressing Lower body dressing/undressing activity did not occur: N/A What is the patient wearing?: Pants, Ted Hose, Non-skid slipper socks     Pants- Performed by patient: Thread/unthread left pants leg Pants- Performed by helper: Thread/unthread left pants leg, Pull pants up/down, Thread/unthread right  pants leg   Non-skid slipper socks- Performed by helper: Don/doff right sock, Don/doff left sock   Socks - Performed by helper: Don/doff right sock, Don/doff left sock   Shoes - Performed by helper: Don/doff right shoe, Don/doff left shoe, Fasten right, Fasten left       TED Hose - Performed by helper: Don/doff right TED hose, Don/doff left TED hose  Lower body assist Assist for lower body dressing:  (total assist of 1)      Toileting Toileting Toileting activity did not occur: Safety/medical concerns Toileting steps completed by patient: Performs perineal hygiene Toileting steps completed by helper: Adjust clothing prior to toileting, Adjust clothing after toileting Toileting Assistive Devices: Grab bar or rail  Toileting assist Assist level: Two helpers   Transfers Chair/bed transfer   Chair/bed transfer method: Stand pivot Chair/bed transfer assist level: Maximal assist (Pt 25 - 49%/lift and lower) Chair/bed transfer assistive device: Armrests Mechanical lift: Maximove   Locomotion Ambulation Ambulation activity did not occur: Safety/medical concerns   Max distance: 25 ft Assist level: Maximal assist (Pt 25 - 49%)   Wheelchair   Type: Manual Max wheelchair distance: 100 ft Assist Level: Moderate assistance (Pt 50 - 74%)  Cognition Comprehension Comprehension assist level: Understands basic 50 - 74% of the time/ requires cueing 25 - 49% of the time  Expression Expression assist level: Expresses basic 50 - 74% of the time/requires cueing 25 - 49% of the time. Needs to repeat parts of sentences.  Social Interaction Social Interaction assist level: Interacts appropriately 25 - 49% of time - Needs  frequent redirection.  Problem Solving Problem solving assist level: Solves basic 50 - 74% of the time/requires cueing 25 - 49% of the time  Memory Memory assist level: Recognizes or recalls 50 - 74% of the time/requires cueing 25 - 49% of the time   Medical Problem List and  Plan: 1. 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 DVT Prophylaxis/Anticoagulation: Subcutaneous heparin  3. Pain Management: Hydrocodone as needed 4. Dysphagia. Dysphagia #2 honey thick liquids.  -continue IV fluids for hydration-  hs only --continue tonight and then convert to H2O boluses    -PEG placed. IR to follow up----hopeful to begin feedings today---run TF nocturnally to encourage day time PO and to allow more mobility with therapy  -continue megace also  5. Neuropsych: This patient is not capable of making decisions on her own behalf. 6. Skin/Wound Care: Routine skin checks,     8. Fluids/Electrolytes/Nutrition: following lytes,   -continue repleted potassium 9. Seizure prophylaxis. Keppra held for somnolence EEG neg, Hold per neuro 10. Leukocytosis. Follow-up urine study/follow-up CBC 11. Hypertension. Cozaar 100 mg daily. Monitor with increased mobility  Slightly labile, however overall relatively controlled 12. Hyperlipidemia. Lipitor 13. Lethargy post stroke: arousal can still wax and wane but trending in the right direction  -continue ritalin at 10mg   -keppra stopped by neurology 14. Low grade temp resolved  -CXR clear again.   15. Spasticity: PRAFO RLE  -continue ROM/modalities 16. Loose stool: improved with cessation of TF  -stopped cholestyramine for now   -continue probiotic   LOS (Days) 15 A FACE TO FACE EVALUATION WAS PERFORMED  Supriya Beaston T 08/26/2015 8:53 AM

## 2015-08-26 NOTE — Progress Notes (Signed)
Nutrition Follow-up  DOCUMENTATION CODES:   Severe malnutrition in context of acute illness/injury  INTERVENTION:  -Initiate Jevity 1.5 formula via PEG @ goal rate of 70 ml/hr (840 ml/day) for 12 hours overnight (7 pm-7 am) with 60 ml prostat. This will provide 1460 kcals (meets 88% needs), 84 g protein (100% of needs), and 639 ml of free water. -Once IVF are discontinued, recommend free water flushes of 250 mls every 8 hours.   NUTRITION DIAGNOSIS:   Malnutrition related to acute illness as evidenced by energy intake < or equal to 50% for > or equal to 5 days, moderate depletions of muscle mass.  Ongoing  GOAL:   Patient will meet greater than or equal to 90% of their needs  Progressing  MONITOR:   PO intake, Diet advancement, TF tolerance, Weight trends, Labs, I & O's  REASON FOR ASSESSMENT:   Consult Enteral/tube feeding initiation and management  ASSESSMENT:   75 y.o. right handed female with history of hypertension. 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  PEG placed 5/8. Still receiving IVF through the night.  Spoke to RN who said pt is eating ~10% of meals. Nocturnal tube feeding and PO intake will provide >/=90% of needs. Pt not drinking Ensure. Will continue to provide Ensure if pt wants it.   Labs reviewed; CBGs 67-115. Meds reviewed; Megace, KCl 20 mEq.  Diet Order:  DIET - DYS 1 Room service appropriate?: Yes; Fluid consistency:: Honey Thick  Skin:  Reviewed, no issues  Last BM:  5/2  Height:   Ht Readings from Last 1 Encounters:  08/01/15 5\' 4"  (1.626 m)    Weight:   Wt Readings from Last 1 Encounters:  08/26/15 133 lb 12.8 oz (60.691 kg)    Ideal Body Weight:  54.5 kg  BMI:  Body mass index is 22.96 kg/(m^2).  Estimated Nutritional Needs:   Kcal:  1650-1850  Protein:  70-85 grams  Fluid:  1.6 - 1.8 L/day  EDUCATION NEEDS:   No  education needs identified at this time  Geoffery Lyons, [place] Dietetic Intern Pager [phone]

## 2015-08-26 NOTE — Progress Notes (Signed)
Speech Language Pathology Weekly Progress and Session Note  Patient Details  Name: [name] MRN: [id number] Date of Birth: 10/15/1940  Beginning of progress report period: Aug 19, 2015 End of progress report period: Aug 26, 2015  Today's Date: 08/26/2015 SLP Individual Time: 1300-1400 SLP Individual Time Calculation (min): 60 min  Short Term Goals: Week 2: SLP Short Term Goal 1 (Week 2): Patient will consume current diet without overt s/s of aspiration with Mod A verbal cues for use of swallowing compensatory strategies.  SLP Short Term Goal 1 - Progress (Week 2): Met SLP Short Term Goal 2 (Week 2): Patient will answer basic yes/no questions utilizing multimodal communication with 75% accuracy with Max A multimodal cues.  SLP Short Term Goal 2 - Progress (Week 2): Met SLP Short Term Goal 3 (Week 2): Patient will follow basic 1 step commands in 75% of opporunities with Mod A multimodal cues.  SLP Short Term Goal 3 - Progress (Week 2): Met SLP Short Term Goal 4 (Week 2): Patient will maintain arousal in regards to keeping her eyes open for 30 minutes with Max A multimodal cues over 2 consecutive sessions.  SLP Short Term Goal 4 - Progress (Week 2): Met SLP Short Term Goal 5 (Week 2): Patient will vocalize with Max A multimodal cues in 20% of opportunities  SLP Short Term Goal 5 - Progress (Week 2): Met SLP Short Term Goal 6 (Week 2): Patient will name functional items at the word level with Max a multimodal cues in 20% of opportunities.  SLP Short Term Goal 6 - Progress (Week 2): Met    New Short Term Goals: Week 3: SLP Short Term Goal 1 (Week 3): Patient will consume current diet without overt s/s of aspiration with supervision verbal cues for use of swallowing compensatory strategies.  SLP Short Term Goal 2 (Week 3): Patient will demonstrate efficient mastication of Dys. 2 textures with complete oral clearance over 2 consecutive sessions prior to upgrade with Min A verbal cues.  SLP  Short Term Goal 3 (Week 3): Patient will consume trials of nectar-thick liquids with minimal overt s/s of aspiration (30% of trials) over 2 session to asess readiness for repeat MBS.  SLP Short Term Goal 4 (Week 3): Patient will name functional items at the word level with Max A multimodal cues in 75% of opportunities.  SLP Short Term Goal 5 (Week 3): Patient will vocalize with Max A multimodal cues in 75% of opportunities  SLP Short Term Goal 6 (Week 3): Patient will answer basic yes/no questions utilizing multimodal communication with 75% accuracy with Mod A multimodal cues.   Weekly Progress Updates: Patient has made excellent gains and has met 6 of 6 STG's this reporting period due to increased arousal, functional communication and swallowing function. Currently, patient has been more consistently awake throughout sessions and has been able to participate in a session for up 60 minutes at a time. Patient can vocalize in 50% of opportunities with Mod-Max a verbal cues and can name functional items with Max A multimodal cues. Patient demonstrates increased ability to follow basic commands with functional tasks and can spontaneously verbalize "yes" and "no" intermittently but often utilizes gestures to express basic wants/needs. Patient continues to demonstrate intermittent PO intake and requires Min-Mod A for use of swallowing strategies and has been participating in minimal trials of upgraded textures/liquids with SLP. Patient and family education is ongoing. Patient would benefit from continued skilled SLP intervention to maximize her cognitive and swallowing  function and overall functional communication prior to discharge.     Intensity: Minumum of 1-2 x/day, 30 to 90 minutes Frequency: 3 to 5 out of 7 days Duration/Length of Stay: TBD due to SNF placement  Treatment/Interventions: Cueing hierarchy;Functional tasks;Patient/family education;Environmental controls;Internal/external  aids;Dysphagia/aspiration precaution training;Therapeutic Activities;Cognitive remediation/compensation;Speech/Language facilitation;Multimodal communication approach   Daily Session  Skilled Therapeutic Interventions: Skilled treatment session focused on dysphagia and functional communication goals.  Upon arrival, patient was asleep while supine in bed but was easily awakened by voice. Patient was transferred to the wheelchair with Max A and had been incontinent of urine. Patient required total A for all self-care tasks due to them taking place while patient was standing with total A. SLP facilitated session by administering trials of ice chips. Patient demonstrated a weak cough X 1 of 10 trials, suspect due to large bolus size. Patient also consumed1 trial of thin liquids via 1/2 tsp and demonstrated overt cough, suspect due to poor oral control and declined further trials. Patient was able to initiate vocalization on command by counting to 5 and required Total A multimodal cues to verbally answer basic yes/no questions in regards to wants/needs. Patient attempting to communicate with clinician but was perseverative on "wh" questions. Patient left at RN station with quick release belt in place. Continue with current plan of care.         Function:   Eating Eating   Modified Consistency Diet: Yes Eating Assist Level: More than reasonable amount of time;Supervision or verbal cues;Helper scoops food on utensil   Eating Set Up Assist For: Opening containers Helper [place] on Utensil: Every scoop (with trials from SLP )     Cognition Comprehension Comprehension assist level: Understands basic 50 - 74% of the time/ requires cueing 25 - 49% of the time  Expression   Expression assist level: Expresses basic 25 - 49% of the time/requires cueing 50 - 75% of the time. Uses single words/gestures.  Social Interaction Social Interaction assist level: Interacts appropriately 75 - 89% of the time - Needs  redirection for appropriate language or to initiate interaction.  Problem Solving Problem solving assist level: Solves basic 50 - 74% of the time/requires cueing 25 - 49% of the time  Memory Memory assist level: Recognizes or recalls 50 - 74% of the time/requires cueing 25 - 49% of the time   Pain No s/s of of pain   Therapy/Group: Individual Therapy  Eletha Culbertson 08/26/2015, 3:51 PM

## 2015-08-26 NOTE — Progress Notes (Signed)
Physical Therapy Session Note  Patient Details  Name: [name] MRN: [id number] Date of Birth: 07-09-1940  Today's Date: 08/26/2015 PT Individual Time: 1500-1625 PT Individual Time Calculation (min): 85 min   Short Term Goals: Week 2:  PT Short Term Goal 1 (Week 2): Patient will maintain arousal for 30 min consistently with min multimodal cues. PT Short Term Goal 2 (Week 2): Patient will follow basic 1 step commands in 75% of available opportunities.  PT Short Term Goal 3 (Week 2): Patient will maintain sitting balance x 5 min with supervision.  PT Short Term Goal 4 (Week 2): Patient will stand with use of equipment x 5 min.   Skilled Therapeutic Interventions/Progress Updates:   Patient in wheelchair with husband present, required encouragement to don shoes with total A. Patient propelled wheelchair to/from gym approx 100 ft using L hemi technique with supervision and max verbal/visual cues for R obstacle negotiation. Gait training using hemiwalker x 35 ft with max A to advance/place RLE with patient demonstrating improved upright posture, improved ability to advance hemiwalker with max verbal/tactile cues and assist 25% of time, and patient initiating R hip flexion in initial swing phase until fatigued with +2 A wheelchair follow. Stair training up/down 4 (6") stairs using R rail with max A and max multimodal cues for sequencing, technique, and total A to place/stabilize RLE. Performed seated EOM beach ball pass using LUE to work on dynamic balance and balance strategies with supervision and no LOB, initial HOH assist required to hit beach ball instead of attempting to catch with single UE. Engaged in standing functional reaching task with focus on attending to R environment to grasp object and place to target up and slightly outside BOS to L with max A to stabilize RLE in extension. Patient ambulated short distance to NuStep using hemiwalker with max A and max multimodal cues for sequencing  safe turns. Performed NuStep using BLE and LUE at level 1 x 10 minutes with manual facilitation to maintain RLE in neutral position. After transferring back to bed with max A, patient tugging at pants/brief to communicate need to urinate. Performed stand pivot transfer bed <> BSC with max A, max-total A for standing balance while husband assisted with clothing management. Patient performed perineal hygiene with setup assist/supervision for safety with sitting balance on BSC. Patient transferred sit > supine with mod A and left semi reclined in bed with needs in reach and bed alarm on.     Therapy Documentation Precautions:  Precautions Precautions: Fall Precaution Comments: R hemiplegia, R neglect, nonverbal Restrictions Weight Bearing Restrictions: No Pain: Pain Assessment Pain Assessment: Faces Faces Pain Scale: Hurts little more Pain Type: Acute pain Pain Location: Head Pain Descriptors / Indicators: Headache Pain Onset: On-going Pain Intervention(s): Medication (See eMAR)   See Function Navigator for Current Functional Status.   Therapy/Group: Individual Therapy  [name] 08/26/2015, 4:41 PM

## 2015-08-26 NOTE — Progress Notes (Signed)
S/p Perc G-tube yesterday, no overnight problems OK to use for TF. Call IR with issues.  Ascencion Dike PA-C Interventional Radiology 08/26/2015 10:04 AM

## 2015-08-27 ENCOUNTER — Inpatient Hospital Stay (HOSPITAL_COMMUNITY): Payer: Medicare Other | Admitting: Speech Pathology

## 2015-08-27 ENCOUNTER — Inpatient Hospital Stay (HOSPITAL_COMMUNITY): Payer: Medicare Other | Admitting: Occupational Therapy

## 2015-08-27 ENCOUNTER — Inpatient Hospital Stay (HOSPITAL_COMMUNITY): Payer: Medicare Other | Admitting: Physical Therapy

## 2015-08-27 ENCOUNTER — Inpatient Hospital Stay (HOSPITAL_COMMUNITY): Payer: Medicare Other | Admitting: *Deleted

## 2015-08-27 LAB — GLUCOSE, CAPILLARY
GLUCOSE-CAPILLARY: 108 mg/dL — AB (ref 65–99)
Glucose-Capillary: 101 mg/dL — ABNORMAL HIGH (ref 65–99)
Glucose-Capillary: 121 mg/dL — ABNORMAL HIGH (ref 65–99)
Glucose-Capillary: 150 mg/dL — ABNORMAL HIGH (ref 65–99)
Glucose-Capillary: 154 mg/dL — ABNORMAL HIGH (ref 65–99)
Glucose-Capillary: 168 mg/dL — ABNORMAL HIGH (ref 65–99)

## 2015-08-27 MED ORDER — FREE WATER
250.0000 mL | Freq: Three times a day (TID) | Status: DC
  Administered 2015-08-27 – 2015-09-10 (×41): 250 mL

## 2015-08-27 MED ORDER — PANTOPRAZOLE SODIUM 40 MG PO PACK
40.0000 mg | PACK | Freq: Every day | ORAL | Status: DC
  Administered 2015-08-28 – 2015-09-11 (×14): 40 mg
  Filled 2015-08-27 (×10): qty 20

## 2015-08-27 NOTE — Progress Notes (Signed)
Occupational Therapy Session Note  Patient Details  Name: [name] MRN: [id number] Date of Birth: July 20, 1940  Today's Date: 08/27/2015 OT Individual Time: [id number] OT Individual Time Calculation (min): 30 min  and Today's Date: 08/27/2015 OT Missed Time: 30 Minutes Missed Time Reason: Patient fatigue;Other (comment)   Short Term Goals: Week 2:  OT Short Term Goal 1 (Week 2): Pt will participate in self care task for 2 minutes in order to decrease level of assist with self care. OT Short Term Goal 2 (Week 2): Pt will locate 3 items to the R during self care tasks by pointing to them in order to demonstrate visual scanning with mod cues OT Short Term Goal 3 (Week 2): Pt will perform rolls to the left and right with max A or less during LB clothing management and hygiene in order to decrease burden of care  Skilled Therapeutic Interventions/Progress Updates:    Pt asleep in bed upon arrival.  Attempted to engage pt in bed mobility, sitting activities, BADL tasks, functional transfers, and attention to task.  Pt would open her eyes for a few seconds but would not follow one step commands.  Multimodal cues (sternal rub, cold wash cloth, noxious stimuli, sitting upright EOB) attempted with no success.  Unable to arouse pt to level of active participation.  Pt returned to bed with all needs within reach.   Therapy Documentation Precautions:  Precautions Precautions: Fall Precaution Comments: R hemiplegia, R neglect, nonverbal Restrictions Weight Bearing Restrictions: No General: General OT Amount of Missed Time: 30 Minutes   Pain: Pain Assessment Pain Assessment: Faces Pain Score: Asleep Faces Pain Scale: No hurt  See Function Navigator for Current Functional Status.   Therapy/Group: Individual Therapy  [name] 08/27/2015, 10:52 AM

## 2015-08-27 NOTE — Progress Notes (Signed)
[name] PHYSICAL MEDICINE & REHABILITATION     PROGRESS NOTE    Subjective/Complaints: No problems overnight. Tolerated TF. Slept well.    ROS: limited due to cognitive status  Objective: Vital Signs: Blood pressure 126/60, pulse 83, temperature 98.5 F (36.9 C), temperature source Oral, resp. rate 20, weight 61.236 kg (135 lb), SpO2 100 %. Ir Gastrostomy Tube Mod Sed  08/25/2015  INDICATION: Acute left cerebral hemisphere hemorrhagic stroke, dysphagia, 8 kg a EXAM: FLUOROSCOPIC 20 FRENCH PULL-THROUGH GASTROSTOMY Date:  5/8/20175/11/2015 10:11 am Radiologist:  M. Daryll Brod, MD Guidance:  Ultrasound and fluoroscopic MEDICATIONS: Clindamycin 600 mg IV; Antibiotics were administered within 1 hour of the procedure. Glucagon 1 mg IV ANESTHESIA/SEDATION: Versed 0.5 mg IV; Fentanyl 25 mcg IV Moderate Sedation Time:  15 The patient was continuously monitored during the procedure by the interventional radiology nurse under my direct supervision. CONTRAST:  10 cc Omnipaque 300 - administered into the gastric lumen. FLUOROSCOPY TIME:  Fluoroscopy Time: 2 minutes 0 seconds (3 mGy). COMPLICATIONS: None immediate. PROCEDURE: Informed consent was obtained from the patient following explanation of the procedure, risks, benefits and alternatives. The patient understands, agrees and consents for the procedure. All questions were addressed. A time out was performed. Maximal barrier sterile technique utilized including caps, mask, sterile gowns, sterile gloves, large sterile drape, hand hygiene, and betadine prep. The left upper quadrant was sterilely prepped and draped. An oral gastric catheter was inserted into the stomach under fluoroscopy. The existing nasogastric feeding tube was removed. Air was injected into the stomach for insufflation and visualization under fluoroscopy. The air distended stomach was confirmed beneath the anterior abdominal wall in the frontal and lateral projections. Under sterile  conditions and local anesthesia, a 70 gauge trocar needle was utilized to access the stomach percutaneously beneath the left subcostal margin. Needle position was confirmed within the stomach under biplane fluoroscopy. Contrast injection confirmed position also. A single T tack was deployed for gastropexy. Over an Amplatz guide wire, a 9-French sheath was inserted into the stomach. A snare device was utilized to capture the oral gastric catheter. The snare device was pulled retrograde from the stomach up the esophagus and out the oropharynx. The 20-French pull-through gastrostomy was connected to the snare device and pulled antegrade through the oropharynx down the esophagus into the stomach and then through the percutaneous tract external to the patient. The gastrostomy was assembled externally. Contrast injection confirms position in the stomach. Images were obtained for documentation. The patient tolerated procedure well. No immediate complication. IMPRESSION: Fluoroscopic insertion of a 20-French "pull-through" gastrostomy. Electronically Signed   By: Jerilynn Mages.  Shick M.D.   On: 08/25/2015 10:22    Recent Labs  08/25/15 0654  WBC 7.4  HGB 12.1  HCT 36.5  PLT 389    Recent Labs  08/25/15 0654  NA 136  K 3.7  CL 103  GLUCOSE 93  BUN 15  CREATININE 0.69  CALCIUM 9.3   CBG (last 3)   Recent Labs  08/27/15 0021 08/27/15 0409 08/27/15 0830  GLUCAP 154* 168* 150*    Wt Readings from Last 3 Encounters:  08/27/15 61.236 kg (135 lb)  08/05/15 64.5 kg (142 lb 3.2 oz)  06/14/11 64.501 kg (142 lb 3.2 oz)    Physical Exam:  Constitutional: She appears well-developed, well nourished. NAD. HENT: Normocephalic. Atraumatic.  Cardiovascular: Normal rate and regular rhythm. + systolic Murmur Respiratory: Effort normal and breath sounds normal. No respiratory distress. no wheezes or rales GI: Soft. Bowel sounds are normal.  gtube site appears clean without drainage  Neurological:  Arouses to  verbal stim again.   Makes good eye contact when awake.  DTRs 3+ right upper and right lower extremity Motor:  Right upper extremity and right lower extremity:  0/5 Left upper and left lower extremity >/3/5 or more (patient not participating in MMT) Flexor tone RUE---2/4, tr to1/4 RLE Skin: Skin is warm and dry.  Psych: Withdrawn.  Assessment/Plan: 1. 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 Physiatrist is providing close team supervision and 24 hour management of active medical problems listed below. Physiatrist and rehab team continue to assess barriers to discharge/monitor patient progress toward functional and medical goals.  Function:  Bathing Bathing position Bathing activity did not occur: N/A (evening bath) Position: Sitting EOB (UB only and night bath)  Bathing parts Body parts bathed by patient: Chest, Abdomen Body parts bathed by helper: Right arm, Left arm  Bathing assist Assist Level:  (Total assist)      Upper Body Dressing/Undressing Upper body dressing   What is the patient wearing?: Pull over shirt/dress     Pull over shirt/dress - Perfomed by patient: Thread/unthread left sleeve, Put head through opening Pull over shirt/dress - Perfomed by helper: Thread/unthread right sleeve, Pull shirt over trunk        Upper body assist Assist Level:  (mod assist)      Lower Body Dressing/Undressing Lower body dressing Lower body dressing/undressing activity did not occur: N/A What is the patient wearing?: Pants, Ted Hose, Non-skid slipper socks     Pants- Performed by patient: Thread/unthread left pants leg Pants- Performed by helper: Thread/unthread right pants leg, Pull pants up/down   Non-skid slipper socks- Performed by helper: Don/doff right sock, Don/doff left sock   Socks - Performed by helper: Don/doff right sock,  Don/doff left sock   Shoes - Performed by helper: Don/doff right shoe, Don/doff left shoe, Fasten right, Fasten left       TED Hose - Performed by helper: Don/doff right TED hose, Don/doff left TED hose  Lower body assist Assist for lower body dressing: 2 Helpers      Naval architect activity did not occur: Safety/medical concerns Toileting steps completed by patient: Performs perineal hygiene Toileting steps completed by helper: Adjust clothing prior to toileting, Adjust clothing after toileting Toileting Assistive Devices: Grab bar or rail  Toileting assist Assist level: Two helpers   Transfers Chair/bed transfer   Chair/bed transfer method: Squat pivot Chair/bed transfer assist level: Maximal assist (Pt 25 - 49%/lift and lower) Chair/bed transfer assistive device: Armrests Mechanical lift: Maximove   Locomotion Ambulation Ambulation activity did not occur: Safety/medical concerns   Max distance: 35 ft Assist level: 2 helpers (max A, +2 WC follow)   Wheelchair   Type: Manual Max wheelchair distance: 100 ft Assist Level: Supervision or verbal cues  Cognition Comprehension Comprehension assist level: Understands basic 50 - 74% of the time/ requires cueing 25 - 49% of the time  Expression Expression assist level: Expresses basic 25 - 49% of the time/requires cueing 50 - 75% of the time. Uses single words/gestures.  Social Interaction Social Interaction assist level: Interacts appropriately 75 - 89% of the time - Needs redirection for appropriate language or to initiate interaction.  Problem Solving Problem solving assist level: Solves basic 50 - 74% of the time/requires cueing 25 - 49% of the time  Memory  Memory assist level: Recognizes or recalls 50 - 74% of the time/requires cueing 25 - 49% of the time   Medical Problem List and Plan: 1. 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 DVT Prophylaxis/Anticoagulation: Subcutaneous heparin  3. Pain Management: Hydrocodone as needed 4. Dysphagia. Dysphagia #2 honey thick liquids.  -continue IV fluids for hydration-  hs only --continue tonight and then convert to H2O boluses    -PEG placed. IR to follow up----hopeful to begin feedings today---run TF nocturnally to encourage day time PO and to allow more mobility with therapy  -continue megace also  5. Neuropsych: This patient is not capable of making decisions on her own behalf. 6. Skin/Wound Care: Routine skin checks,     8. Fluids/Electrolytes/Nutrition: following lytes,   -continue repleted potassium 9. Seizure prophylaxis. Keppra held for somnolence EEG neg, Hold per neuro 10. Leukocytosis. Follow-up urine study/follow-up CBC 11. Hypertension. Cozaar 100 mg daily. Monitor with increased mobility  -continue current regimen 12. Hyperlipidemia. Lipitor 13. Lethargy post stroke: arousal can still wax and wane but trending in the right direction  -continue ritalin at 10mg    14. Low grade temp resolved  - asp precautions.   15. Spasticity: PRAFO RLE  -continue ROM/modalities 16. Loose stool: will observe for pattern now that TF resumed  - once soft stool early this morning  -continue probiotic   LOS (Days) 16 A FACE TO FACE EVALUATION WAS PERFORMED  SWARTZ,ZACHARY T 08/27/2015 9:00 AM

## 2015-08-27 NOTE — Progress Notes (Signed)
Speech Language Pathology Daily Session Note  Patient Details  Name: [name] MRN: [id number] Date of Birth: 14-Dec-1940  Today's Date: 08/27/2015 SLP Individual Time: 1105-1120 SLP Individual Time Calculation (min): 15 min and Today's Date: 08/27/2015 SLP Missed Time: 8 Minutes Missed Time Reason: Patient fatigue  Short Term Goals: Week 3: SLP Short Term Goal 1 (Week 3): Patient will consume current diet without overt s/s of aspiration with supervision verbal cues for use of swallowing compensatory strategies.  SLP Short Term Goal 2 (Week 3): Patient will demonstrate efficient mastication of Dys. 2 textures with complete oral clearance over 2 consecutive sessions prior to upgrade with Min A verbal cues.  SLP Short Term Goal 3 (Week 3): Patient will consume trials of nectar-thick liquids with minimal overt s/s of aspiration (30% of trials) over 2 session to asess readiness for repeat MBS.  SLP Short Term Goal 4 (Week 3): Patient will name functional items at the word level with Max A multimodal cues in 75% of opportunities.  SLP Short Term Goal 5 (Week 3): Patient will vocalize with Max A multimodal cues in 75% of opportunities  SLP Short Term Goal 6 (Week 3): Patient will answer basic yes/no questions utilizing multimodal communication with 75% accuracy with Mod A multimodal cues.   Skilled Therapeutic Interventions: Skilled treatment session focused on addressing cognition goals. SLP facilitated session by providing Max assist multimodal cues for arousal and participation in self-care activities.  Patient required Total assist for completion of oral care via suctioning with no oral movements noted.  Warm and cold wash clothes were also utilized to stimilte arousal with no response, purposeful or restless in nature.  As a result, session was ended early and patient missed 45 minutes of skilled treatment.  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   Function:  Eating Eating Eating activity  did not occur: Safety/medical concerns                Pain Pain Assessment Pain Assessment: No/denies pain Pain Score: Asleep Faces Pain Scale: No hurt  Therapy/Group: Individual Therapy  Carmelia Roller., [place] [id number]  [place] 08/27/2015, 11:24 AM

## 2015-08-27 NOTE — Progress Notes (Signed)
Occupational Therapy Session Note  Patient Details  Name: [name] MRN: [id number] Date of Birth: 07-03-1940  Today's Date: 08/27/2015 OT Individual Time: [id number] OT Individual Time Calculation (min): 45 min  and Today's Date: 08/27/2015 OT Missed Time: 15 Minutes Missed Time Reason: Patient fatigue;Other (comment) (unarousable)   Short Term Goals: Week 2:  OT Short Term Goal 1 (Week 2): Pt will participate in self care task for 2 minutes in order to decrease level of assist with self care. OT Short Term Goal 2 (Week 2): Pt will locate 3 items to the R during self care tasks by pointing to them in order to demonstrate visual scanning with mod cues OT Short Term Goal 3 (Week 2): Pt will perform rolls to the left and right with max A or less during LB clothing management and hygiene in order to decrease burden of care  Skilled Therapeutic Interventions/Progress Updates:    Pt in bed sound asleep. 15 minutes spent trying to arouse pt. During this time massage and ROM to tight RUE along with scrubbing inside of R palm as pt had hand odor.  [name] PT assisted with a bed to w/c transfer for +2 A as pt was asleep. She did briefly open her eyes when she was first sat to EOB.  Pt taken to sink. Therapist attempted further arousal with washing her face with cold washcloth, cold washcloth on back of neck, cleansing mouth with oral sponge and brushing hair. After 15 min, call NT in to assist with +2 transfer back to bed. Her brief was slightly soiled. Pt needed total A with bed mobility for brief change but did open eyes for a second or two.  Pt adjusted in bed with pillow to support arm. Bed alarm on.  Therapy Documentation Precautions:  Precautions Precautions: Fall Precaution Comments: R hemiplegia, R neglect, nonverbal Restrictions Weight Bearing Restrictions: No General: General OT Amount of Missed Time: 15 Minutes Vital Signs: Therapy Vitals Temp: 98.5 F (36.9 C) Temp Source:  Oral Pulse Rate: 83 Resp: 20 BP: 126/60 mmHg Patient Position (if appropriate): Sitting Oxygen Therapy SpO2: 100 % O2 Device: Not Delivered Pain: Pain Assessment Pain Assessment: Faces Pain Score: Asleep Faces Pain Scale: No hurt ADL:   See Function Navigator for Current Functional Status.   Therapy/Group: Individual Therapy  [place] 08/27/2015, 9:24 AM

## 2015-08-27 NOTE — Progress Notes (Signed)
Physical Therapy Session Note  Patient Details  Name: [name] MRN: [id number] Date of Birth: 11-Apr-1941  Today's Date: 08/27/2015 PT Individual Time: [id number] PT Individual Time Calculation (min): 12 min   Short Term Goals: Week 2:  PT Short Term Goal 1 (Week 2): Patient will maintain arousal for 30 min consistently with min multimodal cues. PT Short Term Goal 2 (Week 2): Patient will follow basic 1 step commands in 75% of available opportunities.  PT Short Term Goal 3 (Week 2): Patient will maintain sitting balance x 5 min with supervision.  PT Short Term Goal 4 (Week 2): Patient will stand with use of equipment x 5 min.   Skilled Therapeutic Interventions/Progress Updates:   Patient asleep in bed upon arrival with husband at bedside.Attempted to engage patient in therapeutic activities with max multimodal cues and noxious stimuli (cold wash cloth, sternal rub, PROM RUE) and repositioning to upright in bed. Patient did not open eyes at any time but grimaced with RUE ROM and did not attempt to move cold washcloth from face. Patient rolled from R sidelying > supine > L sidelying with total A. Patient left asleep in bed, husband present and RN aware.   Therapy Documentation Precautions:  Precautions Precautions: Fall Precaution Comments: R hemiplegia, R neglect, nonverbal Restrictions Weight Bearing Restrictions: No General: PT Amount of Missed Time (min): 48 Minutes PT Missed Treatment Reason: Patient fatigue (unable to arouse) Pain: Pain Assessment Pain Assessment: Faces Faces Pain Scale: Hurts even more Pain Type: Acute pain Pain Location: Arm Pain Orientation: Right Pain Descriptors / Indicators: Grimacing;Tightness Pain Onset: With Activity (PROM RUE) Pain Intervention(s): Repositioned   See Function Navigator for Current Functional Status.   Therapy/Group: Individual Therapy  [name] 08/27/2015, 2:36 PM

## 2015-08-27 NOTE — Progress Notes (Signed)
Occupational Therapy Weekly Progress Note  Patient Details  Name: [name] MRN: [id number] Date of Birth: 1940-10-21  Beginning of progress report period: Aug 20, 2015 End of progress report period: Aug 27, 2015  Today's Date: 08/27/2015  Patient has met 1 of 3 short term goals.  Pt progress has been inconsistent during the past week.  Pt has exhibited periods of increased arousal and actively participated in self care and therapeutic activities.  Pt continues to exhibit periods of limited arousal.  Pt exhibits ideational apraxia as noted during grooming tasks and when challenged with new learning tasks.   Patient continues to demonstrate the following deficits: limited periods of arousal, ideational apraxia, R hemiparesis with hypertonus, decreased trunk control, visual perceptual deficits, cognitive deficits, and aphasia and therefore will continue to benefit from skilled OT intervention to enhance overall performance with BADL.  Patient progressing toward long term goals at very slow rate due to decreased arousal. When pt is alert, she has been able to participate..  Continue plan of care.  OT Short Term Goals Week 2:  OT Short Term Goal 1 (Week 2): Pt will participate in self care task for 2 minutes in order to decrease level of assist with self care. OT Short Term Goal 1 - Progress (Week 2): Progressing toward goal OT Short Term Goal 2 (Week 2): Pt will locate 3 items to the R during self care tasks by pointing to them in order to demonstrate visual scanning with mod cues OT Short Term Goal 2 - Progress (Week 2): Progressing toward goal OT Short Term Goal 3 (Week 2): Pt will perform rolls to the left and right with max A or less during LB clothing management and hygiene in order to decrease burden of care OT Short Term Goal 3 - Progress (Week 2): Met Week 3:  OT Short Term Goal 1 (Week 3): Pt will participate in self care tasks for 2 mins in order to decrease level of assist with  self care. OT Short Term Goal 2 (Week 3): Pt will locate 3 items to the R during self care tasks by pointing to them in order to demonstrate visual scanning with mod cues OT Short Term Goal 3 (Week 3): Pt will sit EOB with supervision for 2 mins while engaged in self care tasks OT Short Term Goal 4 (Week 3): Pt will incorporate hemi dressing techniques (threading RUE/RLE into clothing) with mod verbal cues      Therapy Documentation Precautions:  Precautions Precautions: Fall Precaution Comments: R hemiplegia, R neglect, nonverbal Restrictions Weight Bearing Restrictions: No   ADL:  See Function Navigator for Current Functional Status.   Therapy/Group: Individual Therapy  [place] 08/27/2015, 8:27 AM

## 2015-08-28 ENCOUNTER — Inpatient Hospital Stay (HOSPITAL_COMMUNITY): Payer: Medicare Other | Admitting: *Deleted

## 2015-08-28 ENCOUNTER — Inpatient Hospital Stay (HOSPITAL_COMMUNITY): Payer: Medicare Other | Admitting: Speech Pathology

## 2015-08-28 ENCOUNTER — Inpatient Hospital Stay (HOSPITAL_COMMUNITY): Payer: Medicare Other | Admitting: Physical Therapy

## 2015-08-28 LAB — GLUCOSE, CAPILLARY
GLUCOSE-CAPILLARY: 147 mg/dL — AB (ref 65–99)
GLUCOSE-CAPILLARY: 113 mg/dL — AB (ref 65–99)
Glucose-Capillary: 104 mg/dL — ABNORMAL HIGH (ref 65–99)
Glucose-Capillary: 109 mg/dL — ABNORMAL HIGH (ref 65–99)
Glucose-Capillary: 119 mg/dL — ABNORMAL HIGH (ref 65–99)
Glucose-Capillary: 141 mg/dL — ABNORMAL HIGH (ref 65–99)

## 2015-08-28 MED ORDER — BACLOFEN 1 MG/ML ORAL SUSPENSION
5.0000 mg | Freq: Two times a day (BID) | ORAL | Status: DC
  Administered 2015-08-28 – 2015-09-01 (×9): 5 mg
  Filled 2015-08-28 (×10): qty 0.5

## 2015-08-28 NOTE — Progress Notes (Signed)
Physical Therapy Weekly Progress Note  Patient Details  Name: [name] MRN: [id number] Date of Birth: 07-Feb-1941  Beginning of progress report period: Aug 19, 2015 End of progress report period: Aug 28, 2015  Today's Date: 08/28/2015 PT Individual Time: 1420-1530 PT Individual Time Calculation (min): 70 min   Patient has met 3 of 4 short term goals. Patient has made excellent progress since last reporting period with progression from TIS wheelchair to manual wheelchair due to improved postural control and alertness, increased overall arousal and active participation in therapies, initiation of gait using hemiwalker, initiation of stair training, and patient demonstrating improvement in functional communication of basic wants/needs. Patient continues to demonstrate poor safety awareness with impulsivity and R inattention. Patient and family education ongoing.    Patient continues to demonstrate the following deficits: R hemiplegia, muscle weakness, decreased endurance, impaired timing and sequencing, abnormal tone, unbalanced muscle activation, apraxia, decreased coordination, decreased midline orientation, decreased attention to right, decreased attention, decreased awareness, decreased problem solving, decreased safety awareness, decreased memory, delayed processing, decreased sitting balance, decreased standing balance, decreased postural control,and decreased balance strategies  and therefore will continue to benefit from skilled PT intervention to enhance overall performance with activity tolerance, balance, postural control, ability to compensate for deficits, functional use of  right upper extremity and right lower extremity, attention, awareness and coordination.  Patient progressing toward long term goals.  Continue plan of care.  PT Short Term Goals Week 2:  PT Short Term Goal 1 (Week 2): Patient will maintain arousal for 30 min consistently with min multimodal cues. PT Short Term  Goal 1 - Progress (Week 2): Progressing toward goal PT Short Term Goal 2 (Week 2): Patient will follow basic 1 step commands in 75% of available opportunities.  PT Short Term Goal 2 - Progress (Week 2): Met PT Short Term Goal 3 (Week 2): Patient will maintain sitting balance x 5 min with supervision.  PT Short Term Goal 3 - Progress (Week 2): Met PT Short Term Goal 4 (Week 2): Patient will stand with use of equipment x 5 min.  PT Short Term Goal 4 - Progress (Week 2): Met Week 3:  PT Short Term Goal 1 (Week 3): = LTGs due to anticipated LOS  Skilled Therapeutic Interventions/Progress Updates:   Patient awake and alert in TIS wheelchair with son present. Patient verbalizing more and attempting to communicate with this therapist regarding therapy and also stated, "through," when finished with toileting. Patient agreeable to transfer back to manual wheelchair due to increased arousal. Performed squat pivot TIS > bed > manual wheelchair with mod-max A. Patient propelled wheelchair using L hemi technique with HOH assist to initiate use of LUE with supervision and max multimodal cues for R attention for obstacle negotiation in controlled environment and instructed patient to navigate on L side of cones with max cues to attend to R. Gait training using hemiwalker with mod-max A to advance/place RLE and max cues to initiate gait, +2 for wheelchair follow. Patient stopped suddenly, nodded yes for need for bathroom. Patient transported back to room and performed stand pivot transfer wheelchair <> toilet with max to +2A. Patient performed static standing with max A while rehab tech performed hygiene and clothing management with total A. Instructed in squat pivot transfer with max A due to impulsivity and sit <> supine on mat table with max verbal/visual cues to use LLE to hook RLE and bring BLE onto bed with mod A. Instructed in supine RLE NMR with  hooklying hip adduction with ball squeeze to facilitate  co-contraction progressed to ball squeeze between knees with bridging to fatigue with verbal/manual cues. Patient initially unable to demonstrate hip adduction or hip extension RLE but with repetition and cues she demonstrated adequate muscle activation to keep RLE in hooklying position and to perform bridging. Instructed in RLE PNF pattern with demonstration of exercise LLE but no muscle activation noted when attempting with RLE. Patient propelled wheelchair back to room and left sitting in wheelchair with 1/2 lap tray supporting RUE and husband present.    Therapy Documentation Precautions:  Precautions Precautions: Fall Precaution Comments: R hemiplegia, R neglect, nonverbal Restrictions Weight Bearing Restrictions: No Pain: Pain Assessment Pain Assessment: No/denies pain Faces Pain Scale: No hurt  See Function Navigator for Current Functional Status.  Therapy/Group: Individual Therapy  [name] 08/28/2015, 4:38 PM

## 2015-08-28 NOTE — Progress Notes (Signed)
[facility] PHYSICAL MEDICINE & REHABILITATION     PROGRESS NOTE    Subjective/Complaints: Quiet evening. No obvious problems or issues this morning.   ROS: remain limited due to cognitive status  Objective: Vital Signs: Blood pressure 125/63, pulse 87, temperature 97.9 F (36.6 C), temperature source Oral, resp. rate 16, weight 63.776 kg (140 lb 9.6 oz), SpO2 98 %. No results found. No results for input(s): WBC, HGB, HCT, PLT in the last 72 hours. No results for input(s): NA, K, CL, GLUCOSE, BUN, CREATININE, CALCIUM in the last 72 hours.  Invalid input(s): CO CBG (last 3)   Recent Labs  08/27/15 2006 08/28/15 0026 08/28/15 0435  GLUCAP 108* 141* 147*    Wt Readings from Last 3 Encounters:  08/28/15 63.776 kg (140 lb 9.6 oz)  08/05/15 64.5 kg (142 lb 3.2 oz)  06/14/11 64.501 kg (142 lb 3.2 oz)    Physical Exam:  Constitutional: She appears well-developed, well nourished. NAD. HENT: Normocephalic. Atraumatic.  Cardiovascular: Normal rate and regular rhythm. + systolic Murmur Respiratory: Effort normal and breath sounds normal. No respiratory distress. no wheezes or rales GI: Soft. Bowel sounds are normal. gtube site appears clean without drainage  Neurological: tries to lip words but doesn't make an effort to phonate Arouses to verbal stim again.   Makes good eye contact when awake.  DTRs 3+ right upper and right lower extremity Motor:  Right upper extremity and right lower extremity:  0/5 Left upper and left lower extremity >/3/5 or more (patient not participating in MMT) Flexor tone RUE---2/4, tr to1/4 RLE Skin: Skin is warm and dry.  Psych: Withdrawn.  Assessment/Plan: 1. 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 Physiatrist is providing close team supervision and 24 hour management of active medical problems listed  below. Physiatrist and rehab team continue to assess barriers to discharge/monitor patient progress toward functional and medical goals.  Function:  Bathing Bathing position Bathing activity did not occur: N/A (evening bath) Position: Sitting EOB (UB only and night bath)  Bathing parts Body parts bathed by patient: Chest, Abdomen Body parts bathed by helper: Right arm, Left arm  Bathing assist Assist Level:  (Total assist)      Upper Body Dressing/Undressing Upper body dressing   What is the patient wearing?: Pull over shirt/dress     Pull over shirt/dress - Perfomed by patient: Thread/unthread left sleeve, Put head through opening Pull over shirt/dress - Perfomed by helper: Thread/unthread right sleeve, Pull shirt over trunk        Upper body assist Assist Level:  (mod assist)      Lower Body Dressing/Undressing Lower body dressing Lower body dressing/undressing activity did not occur: N/A What is the patient wearing?: Pants, Ted Hose, Non-skid slipper socks     Pants- Performed by patient: Thread/unthread left pants leg Pants- Performed by helper: Thread/unthread right pants leg, Pull pants up/down   Non-skid slipper socks- Performed by helper: Don/doff right sock, Don/doff left sock   Socks - Performed by helper: Don/doff right sock, Don/doff left sock   Shoes - Performed by helper: Don/doff right shoe, Don/doff left shoe, Fasten right, Fasten left       TED Hose - Performed by helper: Don/doff right TED hose, Don/doff left TED hose  Lower body assist Assist for lower body dressing: 2 Helpers      Toileting Toileting Toileting activity did not occur: No  continent bowel/bladder event Toileting steps completed by patient: Performs perineal hygiene Toileting steps completed by helper: Adjust clothing prior to toileting, Adjust clothing after toileting Toileting Assistive Devices: Grab bar or rail  Toileting assist Assist level: Two helpers   Transfers Chair/bed  transfer   Chair/bed transfer method: Squat pivot Chair/bed transfer assist level: Maximal assist (Pt 25 - 49%/lift and lower) Chair/bed transfer assistive device: Armrests Mechanical lift: Maximove   Locomotion Ambulation Ambulation activity did not occur: Safety/medical concerns   Max distance: 35 ft Assist level: 2 helpers (max A, +2 WC follow)   Wheelchair   Type: Manual Max wheelchair distance: 100 ft Assist Level: Supervision or verbal cues  Cognition Comprehension Comprehension assist level: Understands basic 50 - 74% of the time/ requires cueing 25 - 49% of the time  Expression Expression assist level: Expresses basic 25 - 49% of the time/requires cueing 50 - 75% of the time. Uses single words/gestures.  Social Interaction Social Interaction assist level: Interacts appropriately 75 - 89% of the time - Needs redirection for appropriate language or to initiate interaction.  Problem Solving Problem solving assist level: Solves basic 50 - 74% of the time/requires cueing 25 - 49% of the time  Memory Memory assist level: Recognizes or recalls 50 - 74% of the time/requires cueing 25 - 49% of the time   Medical Problem List and Plan: 1. 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 DVT Prophylaxis/Anticoagulation: Subcutaneous heparin  3. Pain Management: Hydrocodone as needed 4. Dysphagia. Dysphagia #2 honey thick liquids.  - converted to H2O boluses    -PEG placed. Having frequent stools but they are generally formed so far  -continue megace also  5. Neuropsych: This patient is not capable of making decisions on her own behalf. 6. Skin/Wound Care: Routine skin checks,     8. Fluids/Electrolytes/Nutrition: following lytes,   -continue repleted potassium 9. Seizure prophylaxis. Keppra held for somnolence EEG neg, Hold per neuro 10. Leukocytosis. Follow-up urine study/follow-up CBC 11.  Hypertension. Cozaar 100 mg daily. Monitor with increased mobility  -continue current regimen 12. Hyperlipidemia. Lipitor 13. Lethargy post stroke: arousal can still wax and wane but trending in the right direction  -continue ritalin at 10mg    14. Low grade temp resolved  - asp precautions.   15. Spasticity: PRAFO RLE  -continue ROM/modalities  -add low dose baclofen and assess for tolerance and effect 16. Loose stool: generally soft/formed  -continue fiber  -continue probiotic  -adjust TF formula as needed   LOS (Days) 17 A FACE TO FACE EVALUATION WAS PERFORMED  SWARTZ,ZACHARY T 08/28/2015 8:24 AM

## 2015-08-28 NOTE — Progress Notes (Signed)
Social Work Patient ID: [name], female   DOB: [birth year], 75 y.o.   MRN: [id number]   Met yesterday afternoon with pt's spouse and her son, [name], to review team conference.  Both asking if targeted d/c date 5/23 is still the goal.  Explained to both that the date is not a set date as her d/c plan is ultimately to d/c to SNF.  Both expressing discouragement over her decreased arousal yesterday after a very productive, alert day prior.  They feel that she would benefit from a CIR stay "as long as we can get".  They are very supportive and involved and feel that CIR "is the best place for her right now."  Discussed team hope that we will see more consistent alertness on a daily basis to have any argument for a longer CIR stay but that SNF is the planned d/c.    Trust Crago, LCSW

## 2015-08-28 NOTE — Progress Notes (Signed)
Occupational Therapy Session Note  Patient Details  Name: ELISAMA CRISOLOGO MRN: [id number] Date of Birth: 1940-10-03  Today's Date: 08/28/2015 OT Individual Time: 1000-1100 OT Individual Time Calculation (min): 60 min    Short Term Goals: Week 3:  OT Short Term Goal 1 (Week 3): Pt will participate in self care tasks for 2 mins in order to decrease level of assist with self care. OT Short Term Goal 2 (Week 3): Pt will locate 3 items to the R during self care tasks by pointing to them in order to demonstrate visual scanning with mod cues OT Short Term Goal 3 (Week 3): Pt will sit EOB with supervision for 2 mins while engaged in self care tasks OT Short Term Goal 4 (Week 3): Pt will incorporate hemi dressing techniques (threading RUE/RLE into clothing) with mod verbal cues  Skilled Therapeutic Interventions/Progress Updates:    Pt resting in bed upon arrival and initially resistant to participating in therapy this morning.  Pt required max encouragement and verbal cues to initiate all tasks. Pt required tot A for bed mobility, mod A for sitting balance at EOB, max A for sit<>stand and stand pivot transfer.  Pt did not initiate any dressing tasks nor did she respond to request to select clothing when offered choices.  Attempted to engage pt in [employer] activity, which she had completed in a previous day.  Pt did not initiate touching any lights during activity.  Pt remained in TIS w/c at nurses station at end of session.  Pt did not actively engage in any tasks/activities during this session.  Focus on active participation, following one step commands, task initiation, sequencing, and arousal to decrease burden of care with BADLs.  Therapy Documentation Precautions:  Precautions Precautions: Fall Precaution Comments: R hemiplegia, R neglect, nonverbal Restrictions Weight Bearing Restrictions: No General:   Vital Signs:   Pain:   ADL:   Exercises:   Other Treatments:    See Function  Navigator for Current Functional Status.   Therapy/Group: Individual Therapy  [name] 08/28/2015, 11:04 AM

## 2015-08-28 NOTE — Progress Notes (Signed)
Speech Language Pathology Daily Session Note  Patient Details  Name: [name] MRN: [id number] Date of Birth: 1940-11-17  Today's Date: 08/28/2015 SLP Individual Time: 0930-1000 SLP Individual Time Calculation (min): 30 min  Short Term Goals: Week 3: SLP Short Term Goal 1 (Week 3): Patient will consume current diet without overt s/s of aspiration with supervision verbal cues for use of swallowing compensatory strategies.  SLP Short Term Goal 2 (Week 3): Patient will demonstrate efficient mastication of Dys. 2 textures with complete oral clearance over 2 consecutive sessions prior to upgrade with Min A verbal cues.  SLP Short Term Goal 3 (Week 3): Patient will consume trials of nectar-thick liquids with minimal overt s/s of aspiration (30% of trials) over 2 session to asess readiness for repeat MBS.  SLP Short Term Goal 4 (Week 3): Patient will name functional items at the word level with Max A multimodal cues in 75% of opportunities.  SLP Short Term Goal 5 (Week 3): Patient will vocalize with Max A multimodal cues in 75% of opportunities  SLP Short Term Goal 6 (Week 3): Patient will answer basic yes/no questions utilizing multimodal communication with 75% accuracy with Mod A multimodal cues.   Skilled Therapeutic Interventions: Pt seen upright in bed with spouse present. Eyes closed for the majority of the session. Pt required mod- max A to participate with functional tasks of face washing and teeth brushing. No verbalizations or directive following. Pt not agreeable to PO trials. Therapy limited by pt participation.   Function:  Eating Eating                 Cognition Comprehension Comprehension assist level: Understands basic less than 25% of the time/ requires cueing >75% of the time  Expression   Expression assist level: Expresses basis less than 25% of the time/requires cueing >75% of the time.  Social Interaction Social Interaction assist level: Interacts appropriately  less than 25% of the time. May be withdrawn or combative.  Problem Solving Problem solving assist level: Solves basic less than 25% of the time - needs direction nearly all the time or does not effectively solve problems and may need a restraint for safety  Memory Memory assist level: Recognizes or recalls less than 25% of the time/requires cueing greater than 75% of the time    Pain Pain Assessment Pain Assessment: Faces Faces Pain Scale: No hurt  Therapy/Group: Individual Therapy  Vinetta Bergamo MA, CCC-SLP 08/28/2015, 4:27 PM

## 2015-08-28 NOTE — Progress Notes (Signed)
Nutrition Follow-up  DOCUMENTATION CODES:   Severe malnutrition in context of acute illness/injury  INTERVENTION:  -Continue Jevity 1.5 formula via PEG @ goal rate of 70 ml/hr (840 ml/day) for 12 hours overnight (7 pm-7 am) with 60 ml prostat. This will provide 1460 kcals (meets 88% needs), 84 g protein (100% of needs), and 639 ml of free water. -Continue free water flushes of 250 mls every 8 hours.    NUTRITION DIAGNOSIS:   Malnutrition related to acute illness as evidenced by energy intake < or equal to 50% for > or equal to 5 days, moderate depletions of muscle mass.  Ongoing  GOAL:   Patient will meet greater than or equal to 90% of their needs  Meeting   MONITOR:   PO intake, Diet advancement, TF tolerance, Weight trends, Labs, I & O's   ASSESSMENT:   74 y.o. right handed female with history of hypertension. 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  Pt in therapy at time of visit. Spoke to [employer]. PT is tolerating nocturnal tube feeding. Pt is consuming very little PO. RN reports pt eating dinner last night but only a few bites of breakfast and lunch today. RN continues to offer pt Ensure. Pt drinks a few sips "when she feels like it".     Labs reviewed; CBGs 75-168. Meds reviewed; Megace  Diet Order:  DIET - DYS 1 Room service appropriate?: Yes; Fluid consistency:: Honey Thick  Skin:  Reviewed, no issues  Last BM:  5/10  Height:   Ht Readings from Last 1 Encounters:  08/01/15 5\' 4"  (1.626 m)    Weight:   Wt Readings from Last 1 Encounters:  08/28/15 140 lb 9.6 oz (63.776 kg)    Ideal Body Weight:  54.5 kg  BMI:  Body mass index is 24.12 kg/(m^2).  Estimated Nutritional Needs:   Kcal:  1650-1850  Protein:  70-85 grams  Fluid:  1.6 - 1.8 L/day  EDUCATION NEEDS:   No education needs identified at this time  Geoffery Lyons, [place] [facility]  Intern Pager [phone]

## 2015-08-29 ENCOUNTER — Inpatient Hospital Stay (HOSPITAL_COMMUNITY): Payer: Medicare Other

## 2015-08-29 ENCOUNTER — Inpatient Hospital Stay (HOSPITAL_COMMUNITY): Payer: Medicare Other | Admitting: Physical Therapy

## 2015-08-29 ENCOUNTER — Inpatient Hospital Stay (HOSPITAL_COMMUNITY): Payer: Medicare Other | Admitting: Speech Pathology

## 2015-08-29 LAB — GLUCOSE, CAPILLARY
GLUCOSE-CAPILLARY: 109 mg/dL — AB (ref 65–99)
GLUCOSE-CAPILLARY: 156 mg/dL — AB (ref 65–99)
GLUCOSE-CAPILLARY: 99 mg/dL (ref 65–99)
Glucose-Capillary: 99 mg/dL (ref 65–99)
Glucose-Capillary: 116 mg/dL — ABNORMAL HIGH (ref 65–99)
Glucose-Capillary: 110 mg/dL — ABNORMAL HIGH (ref 65–99)

## 2015-08-29 NOTE — Progress Notes (Signed)
[facility] RE[facility]ILITATION     PROGRESS NOTE    Subjective/Complaints: Sitting up in bed. Appears comfortable. No problems overnight. Only one stool yesterday  ROS: remain limited due to cognitive status  Objective: Vital Signs: Blood pressure 115/48, pulse 79, temperature 97.6 F (36.4 C), temperature source Oral, resp. rate 18, weight 63.458 kg (139 lb 14.4 oz), SpO2 98 %. No results found. No results for input(s): WBC, HGB, HCT, PLT in the last 72 hours. No results for input(s): NA, K, CL, GLUCOSE, BUN, CREATININE, CALCIUM in the last 72 hours.  Invalid input(s): CO CBG (last 3)   Recent Labs  08/29/15 0009 08/29/15 0429 08/29/15 0753  GLUCAP 109* 156* 99    Wt Readings from Last 3 Encounters:  08/29/15 63.458 kg (139 lb 14.4 oz)  08/05/15 64.5 kg (142 lb 3.2 oz)  06/14/11 64.501 kg (142 lb 3.2 oz)    Physical Exam:  Constitutional: She appears well-developed, well nourished. NAD. HENT: Normocephalic. Atraumatic.  Cardiovascular: Normal rate and regular rhythm. + systolic Murmur Respiratory: Effort normal and breath sounds normal. No respiratory distress. no wheezes or rales GI: Soft. Bowel sounds are normal. gtube site appears clean without drainage  Neurological: tries to lip words but doesn't make an effort to phonate Arouses quickly to verbal stim   Makes good eye contact when awake.  Follows a few simple commands DTRs 3+ right upper and right lower extremity Motor:  Right upper extremity and right lower extremity:  0/5 Left upper and left lower extremity >/3/5 or more (patient not participating in MMT) Flexor tone RUE---2/4, tr to1/4 RLE Skin: Skin is warm and dry.  Psych: Withdrawn.  Assessment/Plan: 1. 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 Physiatrist is providing close team supervision and  24 hour management of active medical problems listed below. Physiatrist and rehab team continue to assess barriers to discharge/monitor patient progress toward functional and medical goals.  Function:  Bathing Bathing position Bathing activity did not occur: N/A Position: Sitting EOB (UB only and night bath)  Bathing parts Body parts bathed by patient: Chest, Abdomen Body parts bathed by helper: Right arm, Left arm  Bathing assist Assist Level:  (Total assist)      Upper Body Dressing/Undressing Upper body dressing   What is the patient wearing?: Pull over shirt/dress     Pull over shirt/dress - Perfomed by patient: Thread/unthread left sleeve, Put head through opening Pull over shirt/dress - Perfomed by helper: Thread/unthread right sleeve, Thread/unthread left sleeve, Put head through opening, Pull shirt over trunk        Upper body assist Assist Level:  (mod assist)      Lower Body Dressing/Undressing Lower body dressing Lower body dressing/undressing activity did not occur: N/A What is the patient wearing?: Pants, Ted Hose, Non-skid slipper socks     Pants- Performed by patient: Thread/unthread left pants leg Pants- Performed by helper: Thread/unthread left pants leg, Thread/unthread right pants leg, Pull pants up/down, Fasten/unfasten pants   Non-skid slipper socks- Performed by helper: Don/doff right sock, Don/doff left sock   Socks - Performed by helper: Don/doff right sock, Don/doff left sock   Shoes - Performed by helper: Don/doff right shoe, Don/doff left shoe, Fasten right, Fasten left       TED Hose - Performed by helper: Don/doff right TED hose, Don/doff left TED hose  Lower body assist Assist  for lower body dressing: 2 Helpers      Naval architect activity did not occur: No continent bowel/bladder event Toileting steps completed by patient: Performs perineal hygiene Toileting steps completed by helper: Adjust clothing prior to toileting,  Adjust clothing after toileting, Performs perineal hygiene Toileting Assistive Devices: Grab bar or rail  Toileting assist Assist level: Two helpers   Transfers Chair/bed transfer   Chair/bed transfer method: Squat pivot Chair/bed transfer assist level: Maximal assist (Pt 25 - 49%/lift and lower) Chair/bed transfer assistive device: Armrests Mechanical lift: Maximove   Locomotion Ambulation Ambulation activity did not occur: Safety/medical concerns   Max distance: 30 ft Assist level: 2 helpers   Wheelchair   Type: Manual Max wheelchair distance: 100 ft Assist Level: Supervision or verbal cues  Cognition Comprehension Comprehension assist level: Understands basic 50 - 74% of the time/ requires cueing 25 - 49% of the time  Expression Expression assist level: Expresses basis less than 25% of the time/requires cueing >75% of the time.  Social Interaction Social Interaction assist level: Interacts appropriately 50 - 74% of the time - May be physically or verbally inappropriate.  Problem Solving Problem solving assist level: Solves basic 50 - 74% of the time/requires cueing 25 - 49% of the time  Memory Memory assist level: Recognizes or recalls 50 - 74% of the time/requires cueing 25 - 49% of the time   Medical Problem List and Plan: 1. 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 DVT Prophylaxis/Anticoagulation: Subcutaneous heparin  3. Pain Management: Hydrocodone as needed 4. Dysphagia. Dysphagia #2 honey thick liquids.  - converted to H2O boluses    -PEG placed and tolerating  -continue megace to help with po appetite---may ultimately need to reduce feeds, but not now  5. Neuropsych: This patient is not capable of making decisions on her own behalf. 6. Skin/Wound Care: Routine skin checks,     8. Fluids/Electrolytes/Nutrition: following lytes---recheck next week  -continue repleted  potassium 9. Seizure prophylaxis. Keppra held for somnolence EEG neg, stopped per neuro 10. Leukocytosis. Follow-up urine study/follow-up CBC 11. Hypertension. Cozaar 100 mg daily. Monitor with increased mobility  -continue current regimen 12. Hyperlipidemia. Lipitor 13. Lethargy post stroke: generally improved. Sleep cycle better  -continue ritalin at 10mg    14. Low grade temp resolved  - asp precautions.   15. Spasticity: PRAFO/WHO RUE RLE  -continue ROM/modalities/taping  -added low dose baclofen and assess for tolerance and effect 16. Loose stool: generally soft/formed  -continue fiber  -continue probiotic     LOS (Days) 18 A FACE TO FACE EVALUATION WAS PERFORMED  Thersa Mohiuddin T 08/29/2015 8:55 AM

## 2015-08-29 NOTE — Progress Notes (Signed)
Occupational Therapy Session Note  Patient Details  Name: [name] MRN: [id number] Date of Birth: 05-17-1940  Today's Date: 08/29/2015 OT Individual Time: 1000-1100 OT Individual Time Calculation (min): 60 min    Short Term Goals: Week 3:  OT Short Term Goal 1 (Week 3): Pt will participate in self care tasks for 2 mins in order to decrease level of assist with self care. OT Short Term Goal 2 (Week 3): Pt will locate 3 items to the R during self care tasks by pointing to them in order to demonstrate visual scanning with mod cues OT Short Term Goal 3 (Week 3): Pt will sit EOB with supervision for 2 mins while engaged in self care tasks OT Short Term Goal 4 (Week 3): Pt will incorporate hemi dressing techniques (threading RUE/RLE into clothing) with mod verbal cues  Skilled Therapeutic Interventions/Progress Updates:    Pt engaged in dressing tasks seated EOB with focus on attention to right, hemi dressing techniques, sitting balance, task initiation, and sequencing.  Pt sat approx 10 mins EOB with close supervision and min verbal cues to correct posture when leaning to either R or L. Pt performed stand pivot transfer to w/c with max A.  Pt engaged in grooming tasks seated in w/c at sink.  Pt brushed her teeth with setup and verbal cues to initiate segments and follow precautions.  Pt was able to brush her hair this morning without assistance.  Pt placed lotion on her tongue when lotion placed in her hand to apply to her face.  Pt able to complete task correctly when demonstration offered.  Pt attempted to verbally communicate and was able to say several words but unable to put words together intelligibly.   Therapy Documentation Precautions:  Precautions Precautions: Fall Precaution Comments: R hemiplegia, R neglect, nonverbal Restrictions Weight Bearing Restrictions: No  Pain:  Pt with no s/s pain and shook her head "no" when questioned  See Function Navigator for Current  Functional Status.   Therapy/Group: Individual Therapy  [name] 08/29/2015, 11:04 AM

## 2015-08-29 NOTE — Progress Notes (Signed)
Physical Therapy Session Note  Patient Details  Name: [name] MRN: [id number] Date of Birth: [birth year]  Today's Date: 08/29/2015 PT Individual Time: 1505-1620 PT Individual Time Calculation (min): 75 min   Short Term Goals: Week 3:  PT Short Term Goal 1 (Week 3): = LTGs due to anticipated LOS  Skilled Therapeutic Interventions/Progress Updates:   Session focused on RUE/RLE NMR, trunk activation, dynamic sitting balance, functional ambulation, transfers, and cognitive goals. Patient in bed upon arrival with son present for session. Performed functional transfers with mod A overall and verbal/tactile cues for technique with max cues for safety due to impulsivity. Performed stand pivot transfer wheelchair <> toileting with max A. Patient required max multimodal cues to attend to R with hand hygiene to locate soap. Propelled wheelchair using L hemi technique with only 1 tactile and 1 verbal cue to utilize LUE and min verbal/visual cues for R attention with obstacle negotiation. Gait training using hemiwalker with patient initiating RLE swing phase for first time x 25 ft with max A overall and max multimodal cues for sequencing HW. Performed quadruped with +2A progressed to quadruped with lifting LUE to touch target and therapist stabilizing R shoulder/facilitating RUE extension with tone reduction noted in RUE following task. Performed tall kneeling with small bench for BUE support and to facilitate RUE weightbearing with max A and max multimodal cues for hip extension to neutral, upright posture, and forward gaze. Engaged in seated reaching task outside BOS to L, anterior, and across midline with matching number and color of card to facilitate lateral trunk shortening/elongation and challenge balance strategies. Patient required questioning cues to identify errors but able to self-correct with more than reasonable time. Patient requesting to return to bed, transferred sit > supine with min A to  bring RLE on bed and able to reposition higher in bed with assist to bring RLE into hooklying position/stabilize RLE. Patient's son educated on hooklying hip adduction pillow squeezes and bridging to perform as HEP when in bed, verbalized understanding. Patient left in bed with needs in reach and son present.   Therapy Documentation Precautions:  Precautions Precautions: Fall Precaution Comments: R hemiplegia, R neglect, nonverbal Restrictions Weight Bearing Restrictions: No Vital Signs: Therapy Vitals Pulse Rate: 99 BP: 117/65 mmHg Patient Position (if appropriate): Sitting Oxygen Therapy SpO2: 100 % O2 Device: Not Delivered Pain: Pain Assessment Pain Assessment: Faces Faces Pain Scale: No hurt  See Function Navigator for Current Functional Status.   Therapy/Group: Individual Therapy  [name] 08/29/2015, 4:33 PM

## 2015-08-29 NOTE — Progress Notes (Signed)
Speech Language Pathology Daily Session Note  Patient Details  Name: [name] MRN: [id number] Date of Birth: [birth year]  Today's Date: 08/29/2015 SLP Individual Time: 0900-1000 SLP Individual Time Calculation (min): 60 min  Short Term Goals: Week 3: SLP Short Term Goal 1 (Week 3): Patient will consume current diet without overt s/s of aspiration with supervision verbal cues for use of swallowing compensatory strategies.  SLP Short Term Goal 2 (Week 3): Patient will demonstrate efficient mastication of Dys. 2 textures with complete oral clearance over 2 consecutive sessions prior to upgrade with Min A verbal cues.  SLP Short Term Goal 3 (Week 3): Patient will consume trials of nectar-thick liquids with minimal overt s/s of aspiration (30% of trials) over 2 session to asess readiness for repeat MBS.  SLP Short Term Goal 4 (Week 3): Patient will name functional items at the word level with Max A multimodal cues in 75% of opportunities.  SLP Short Term Goal 5 (Week 3): Patient will vocalize with Max A multimodal cues in 75% of opportunities  SLP Short Term Goal 6 (Week 3): Patient will answer basic yes/no questions utilizing multimodal communication with 75% accuracy with Mod A multimodal cues.   Skilled Therapeutic Interventions: Skilled treatment session focused on dysphagia and functional communication goals. SLP facilitated session by providing trials of nectar-thick liquids via tsp. Patient demonstrated prolonged AP transit with a delayed swallow initiation without overt s/s of aspiration, recommend continued trials to determine readiness for repeat MBS. Patient also consumed trials of Dys. 2 textures and demonstrated prolonged but efficient mastication without overt s/s of aspiration with supervision verbal cues. Recommend continued trials prior to upgrade. Patient demonstrated increased spontaneous language today and attempting to ask questions and engage in conversation about topic of  interest. Patient was able to produce phrases that were ~3 words but also up to 10 words with extra time. Patient also verbalized "yes" and "no" to answer questions in regards to wants/needs with Min A phonemic cues in 100% of opportunities and also vocalized consistently throughout the session with Mod I.    Function:  Eating Eating   Modified Consistency Diet: Yes Eating Assist Level: Helper checks for pocketed food;Help with picking up utensils;Supervision or verbal cues;More than reasonable amount of time   Eating Set Up Assist For: Opening containers Helper Scoops Food on Utensil: Occasionally     Cognition Comprehension Comprehension assist level: Understands basic 50 - 74% of the time/ requires cueing 25 - 49% of the time  Expression   Expression assist level: Expresses basic 25 - 49% of the time/requires cueing 50 - 75% of the time. Uses single words/gestures.  Social Interaction Social Interaction assist level: Interacts appropriately 50 - 74% of the time - May be physically or verbally inappropriate.  Problem Solving Problem solving assist level: Solves basic 50 - 74% of the time/requires cueing 25 - 49% of the time  Memory Memory assist level: Recognizes or recalls 50 - 74% of the time/requires cueing 25 - 49% of the time    Pain No/Denies Pain   Therapy/Group: Individual Therapy  Conda Wannamaker 08/29/2015, 12:40 PM

## 2015-08-30 ENCOUNTER — Inpatient Hospital Stay (HOSPITAL_COMMUNITY): Payer: Medicare Other | Admitting: *Deleted

## 2015-08-30 ENCOUNTER — Inpatient Hospital Stay (HOSPITAL_COMMUNITY): Payer: Medicare Other | Admitting: Physical Therapy

## 2015-08-30 ENCOUNTER — Inpatient Hospital Stay (HOSPITAL_COMMUNITY): Payer: Medicare Other | Admitting: Speech Pathology

## 2015-08-30 LAB — GLUCOSE, CAPILLARY
GLUCOSE-CAPILLARY: 139 mg/dL — AB (ref 65–99)
GLUCOSE-CAPILLARY: 91 mg/dL (ref 65–99)
GLUCOSE-CAPILLARY: 83 mg/dL (ref 65–99)
Glucose-Capillary: 119 mg/dL — ABNORMAL HIGH (ref 65–99)
Glucose-Capillary: 142 mg/dL — ABNORMAL HIGH (ref 65–99)

## 2015-08-30 NOTE — Progress Notes (Signed)
Physical Therapy Session Note  Patient Details  Name: [name] MRN: [id number] Date of Birth: May 31, 1940  Today's Date: 08/30/2015 PT Individual Time: [id number] PT Individual Time Calculation (min): 45 min    Skilled Therapeutic Interventions/Progress Updates:  Patient in bed at the beginning of session, assisted in dressing in supine for lower body-max A,and in sitting for donning shirt -max A. Stand pivot to w/c with max A.  Sitting EOM and training in dynamic balance with reaching for cones ,crossing mid line and with manual facilitation of R lumbar flexors for increase of core strength and coordination, improved sitting balance and positional corrections in order to maintain sitting. Min A to recover from far R and L displacement from BOS.  Sit to stand with max A and manual assistance applied under R IT an don L core, in standing weight shifting to R to increase weight bearing/sensation on that side, 1/2 sit to stand x3 during each standing bout.  Manual R knee locking and pelvic position assistance needed as  Lower extremity continues ot be flaccid.  2 x standing with hemi walker with max to mod A to achieve standing and mod A to maintain erect posture with cues for forward gaze and scapular retraction.  Training in gait on hal;l with L side rail and max A for R LE progression and stability 1 x 27 feet .  Patient returned to room ,left sitting in tilt in space w/c with QR belt on , and all needs within reach.  No pain noted through the session.   Therapy Documentation Precautions:  Precautions Precautions: Fall Precaution Comments: R hemiplegia, R neglect, nonverbal Restrictions Weight Bearing Restrictions: No    See Function Navigator for Current Functional Status.   Therapy/Group: Individual Therapy  [name] 08/30/2015, 12:12 PM

## 2015-08-30 NOTE — Progress Notes (Signed)
Speech Language Pathology Daily Session Note  Patient Details  Name: [name] MRN: [id number] Date of Birth: 05-05-1940  Today's Date: 08/30/2015 SLP Individual Time: 1300-1345 SLP Individual Time Calculation (min): 45 min  Short Term Goals: Week 3: SLP Short Term Goal 1 (Week 3): Patient will consume current diet without overt s/s of aspiration with supervision verbal cues for use of swallowing compensatory strategies.  SLP Short Term Goal 2 (Week 3): Patient will demonstrate efficient mastication of Dys. 2 textures with complete oral clearance over 2 consecutive sessions prior to upgrade with Min A verbal cues.  SLP Short Term Goal 3 (Week 3): Patient will consume trials of nectar-thick liquids with minimal overt s/s of aspiration (30% of trials) over 2 session to asess readiness for repeat MBS.  SLP Short Term Goal 4 (Week 3): Patient will name functional items at the word level with Max A multimodal cues in 75% of opportunities.  SLP Short Term Goal 5 (Week 3): Patient will vocalize with Max A multimodal cues in 75% of opportunities  SLP Short Term Goal 6 (Week 3): Patient will answer basic yes/no questions utilizing multimodal communication with 75% accuracy with Mod A multimodal cues.   Skilled Therapeutic Interventions: Pt upright in WC. Pt engaged and hardworking throughout the session. Pt trialed with dry solid and nectar-thick liquids via teaspoon and cup sip. Pt self-regulated bolus size and only took extremely small (non functional from nutrition standpoint) bites/sips despite encouragement to take larger amounts. No s/s aspiration noted. Complete oral clearance achieved.  Appearance of pharyngeal delay remains present. Automatic speech- counting 1-10 completed with mod A with subsequent perseveration on numbers. Pt made multiple attempts to engage in conversation. Y/N acc was 60% with majority of responses as "yes." Pt excelled with sentence completion talking through activities  of daily living. Ex,  "First I get out of _____, then get in the shower to wash my _____". Pt able to complete sentences with 80% acc with this level of semantic cueing.   Function:  Eating Eating                 Cognition Comprehension Comprehension assist level: Understands basic 50 - 74% of the time/ requires cueing 25 - 49% of the time  Expression   Expression assist level: Expresses basis less than 25% of the time/requires cueing >75% of the time.  Social Interaction Social Interaction assist level: Interacts appropriately 75 - 89% of the time - Needs redirection for appropriate language or to initiate interaction.  Problem Solving Problem solving assist level: Solves basic 25 - 49% of the time - needs direction more than half the time to initiate, plan or complete simple activities  Memory Memory assist level: Recognizes or recalls 25 - 49% of the time/requires cueing 50 - 75% of the time    Pain Pain Assessment Pain Assessment: Faces Faces Pain Scale: No hurt  Therapy/Group: Individual Therapy  Vinetta Bergamo MA, CCC-SLP 08/30/2015, 2:57 PM

## 2015-08-30 NOTE — Progress Notes (Signed)
[place] PHYSICAL MEDICINE & REHABILITATION     PROGRESS NOTE    Subjective/Complaints: Had an uneventful night. Appears comfortable.   ROS: remain limited due to cognitive status  Objective: Vital Signs: Blood pressure 128/65, pulse 77, temperature 98.2 F (36.8 C), temperature source Oral, resp. rate 18, weight 62.551 kg (137 lb 14.4 oz), SpO2 95 %. No results found. No results for input(s): WBC, HGB, HCT, PLT in the last 72 hours. No results for input(s): NA, K, CL, GLUCOSE, BUN, CREATININE, CALCIUM in the last 72 hours.  Invalid input(s): CO CBG (last 3)   Recent Labs  08/29/15 1951 08/30/15 0026 08/30/15 0409  GLUCAP 99 142* 139*    Wt Readings from Last 3 Encounters:  08/30/15 62.551 kg (137 lb 14.4 oz)  08/05/15 64.5 kg (142 lb 3.2 oz)  06/14/11 64.501 kg (142 lb 3.2 oz)    Physical Exam:  Constitutional: She appears well-developed, well nourished. NAD. HENT: Normocephalic. Atraumatic.  Cardiovascular: Normal rate and regular rhythm. + systolic Murmur Respiratory: Effort normal and breath sounds normal. No respiratory distress. no wheezes or rales GI: Soft. Bowel sounds are normal. gtube site appears clean without drainage  Neurological: minimal phonation with attempts to verbalize Arouses quickly to verbal stim   Makes good eye contact when awake.  Follows simple commands DTRs 3+ right upper and right lower extremity Motor:  Right upper extremity and right lower extremity:  0/5 Left upper and left lower extremity >/3/5 or more (patient not participating in MMT) Flexor tone RUE---2/4, tr to1/4 RLE Skin: Skin is warm and dry.  Psych: Withdrawn.  Assessment/Plan: 1. 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 Physiatrist is providing close team supervision and 24 hour management of active medical problems listed  below. Physiatrist and rehab team continue to assess barriers to discharge/monitor patient progress toward functional and medical goals.  Function:  Bathing Bathing position Bathing activity did not occur: N/A Position: Sitting EOB (UB only and night bath)  Bathing parts Body parts bathed by patient: Chest, Abdomen Body parts bathed by helper: Right arm, Left arm  Bathing assist Assist Level:  (Total assist)      Upper Body Dressing/Undressing Upper body dressing Upper body dressing/undressing activity did not occur: N/A What is the patient wearing?: Pull over shirt/dress     Pull over shirt/dress - Perfomed by patient: Thread/unthread left sleeve, Put head through opening Pull over shirt/dress - Perfomed by helper: Thread/unthread right sleeve, Pull shirt over trunk        Upper body assist Assist Level:  (mod assist)      Lower Body Dressing/Undressing Lower body dressing Lower body dressing/undressing activity did not occur: N/A What is the patient wearing?: Pants, Non-skid slipper socks, Ted Hose     Pants- Performed by patient: Thread/unthread left pants leg Pants- Performed by helper: Thread/unthread left pants leg, Thread/unthread right pants leg, Pull pants up/down, Fasten/unfasten pants Non-skid slipper socks- Performed by patient: Don/doff left sock Non-skid slipper socks- Performed by helper: Don/doff right sock   Socks - Performed by helper: Don/doff right sock, Don/doff left sock   Shoes - Performed by helper: Don/doff right shoe, Don/doff left shoe, Fasten right, Fasten left       TED Hose - Performed by helper: Don/doff right TED hose, Don/doff left TED hose  Lower body assist Assist for lower body dressing: 2 Helpers  Toileting Toileting Toileting activity did not occur: No continent bowel/bladder event Toileting steps completed by patient: Performs perineal hygiene Toileting steps completed by helper: Adjust clothing prior to toileting, Adjust  clothing after toileting Toileting Assistive Devices: Grab bar or rail  Toileting assist Assist level: Touching or steadying assistance (Pt.75%)   Transfers Chair/bed transfer   Chair/bed transfer method: Squat pivot Chair/bed transfer assist level: Moderate assist (Pt 50 - 74%/lift or lower) Chair/bed transfer assistive device: Armrests Mechanical lift: Maximove   Locomotion Ambulation Ambulation activity did not occur: Safety/medical concerns   Max distance: 25 Assist level: 2 helpers   Wheelchair   Type: Manual Max wheelchair distance: 100 ft Assist Level: Supervision or verbal cues  Cognition Comprehension Comprehension assist level: Understands basic 75 - 89% of the time/ requires cueing 10 - 24% of the time  Expression Expression assist level: Expresses basic 25 - 49% of the time/requires cueing 50 - 75% of the time. Uses single words/gestures.  Social Interaction Social Interaction assist level: Interacts appropriately 75 - 89% of the time - Needs redirection for appropriate language or to initiate interaction.  Problem Solving Problem solving assist level: Solves basic 50 - 74% of the time/requires cueing 25 - 49% of the time  Memory Memory assist level: Recognizes or recalls 50 - 74% of the time/requires cueing 25 - 49% of the time   Medical Problem List and Plan: 1. 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 DVT Prophylaxis/Anticoagulation: Subcutaneous heparin  3. Pain Management: Hydrocodone as needed 4. Dysphagia. Dysphagia #2 honey thick liquids.  - converted to H2O boluses    -PEG placed and tolerating  -continue megace to help with po appetite---may ultimately need to reduce feeds to better stimulate app, but not now  5. Neuropsych: This patient is not capable of making decisions on her own behalf. 6. Skin/Wound Care: Routine skin checks,     8.  Fluids/Electrolytes/Nutrition: following lytes---recheck next week  -continue repleted potassium 9. Seizure prophylaxis. Keppra held for somnolence EEG neg, stopped per neuro 10. Leukocytosis. Follow-up urine study/follow-up CBC 11. Hypertension. Cozaar 100 mg daily. Monitor with increased mobility  -continue current regimen 12. Hyperlipidemia. Lipitor 13. Lethargy post stroke: generally improved. Sleep cycle better  -continue ritalin at 10mg    14. Low grade temp resolved  - asp precautions.   15. Spasticity: PRAFO/WHO RUE RLE  -continue ROM/modalities/taping  -added low dose baclofen and assess for tolerance and effect 16. Loose stool: now soft/formed  -continue fiber  -continue probiotic     LOS (Days) 19 A FACE TO FACE EVALUATION WAS PERFORMED  SWARTZ,ZACHARY T 08/30/2015 9:38 AM

## 2015-08-30 NOTE — Progress Notes (Signed)
Patient rested quietly during the night.  Tolerating TF without difficulty.  Able to answer simple questions appropriately. No attempts made to get out of bed unassisted.  Patient incontinent during the night.    Fredna Dow M

## 2015-08-30 NOTE — Progress Notes (Signed)
Physical Therapy Session Note  Patient Details  Name: [name] MRN: [id number] Date of Birth: 10/15/1940  Today's Date: 08/30/2015 PT Individual Time: 1030-1100 PT Individual Time Calculation (min): 30 min   Short Term Goals: Week 1:  PT Short Term Goal 1 (Week 1): Patient will maintain arousal for 60 min consistently with min multimodal cues. PT Short Term Goal 1 - Progress (Week 1): Not met PT Short Term Goal 2 (Week 1): Patient will follow basic 1 step commands in 75% of available opportunities.  PT Short Term Goal 2 - Progress (Week 1): Not met PT Short Term Goal 3 (Week 1): Patient will maintain sitting balance x 5 min with supervision.  PT Short Term Goal 3 - Progress (Week 1): Not met PT Short Term Goal 4 (Week 1): Patient will initiate ambulation.  PT Short Term Goal 4 - Progress (Week 1): Not met  Skilled Therapeutic Interventions/Progress Updates:  Pt was seen bedside in the am. Pt transferred supine to edge of bed with head of bed elevated, siderail and min A. Pt transferred edge of bed to w/c squat pivot with mod A. Pt performed multiple stand in parallel bars with min to mod A and verbal cues. While standing focused on weight shifting and upright posture. Pt stood with hemiwalker and mod A with verbal cues. Pt ambulated 25 with L hand rail and +2 A. Pt required max A with second person follow with w/c. Pt ambulated 25 feet with L hand rail and R toe off brace with +2 A. Pt required max A with second person follow with w/c. Pt returned to room following treatment and left sitting up in w/c with quick release belt in place.   Therapy Documentation Precautions:  Precautions Precautions: Fall Precaution Comments: R hemiplegia, R neglect, nonverbal Restrictions Weight Bearing Restrictions: No General:   Pain: Pain Assessment Pain Assessment: No/denies pain  See Function Navigator for Current Functional Status.   Therapy/Group: Individual Therapy  ,   G 08/30/2015, 12:52 PM  

## 2015-08-31 ENCOUNTER — Inpatient Hospital Stay (HOSPITAL_COMMUNITY): Payer: Medicare Other | Admitting: Occupational Therapy

## 2015-08-31 ENCOUNTER — Inpatient Hospital Stay (HOSPITAL_COMMUNITY): Payer: Medicare Other | Admitting: Physical Therapy

## 2015-08-31 LAB — GLUCOSE, CAPILLARY
GLUCOSE-CAPILLARY: 103 mg/dL — AB (ref 65–99)
GLUCOSE-CAPILLARY: 124 mg/dL — AB (ref 65–99)
GLUCOSE-CAPILLARY: 139 mg/dL — AB (ref 65–99)
GLUCOSE-CAPILLARY: 146 mg/dL — AB (ref 65–99)
Glucose-Capillary: 127 mg/dL — ABNORMAL HIGH (ref 65–99)

## 2015-08-31 NOTE — Progress Notes (Signed)
[place] PHYSICAL MEDICINE & REHABILITATION     PROGRESS NOTE    Subjective/Complaints: No problems overnight. Resting comfortably in bed when i arrived bedside   ROS: remain limited due to cognitive status  Objective: Vital Signs: Blood pressure 147/66, pulse 81, temperature 98.3 F (36.8 C), temperature source Oral, resp. rate 18, weight 64.91 kg (143 lb 1.6 oz), SpO2 100 %. No results found. No results for input(s): WBC, HGB, HCT, PLT in the last 72 hours. No results for input(s): NA, K, CL, GLUCOSE, BUN, CREATININE, CALCIUM in the last 72 hours.  Invalid input(s): CO CBG (last 3)   Recent Labs  08/30/15 2102 08/31/15 0032 08/31/15 0425  GLUCAP 119* 146* 127*    Wt Readings from Last 3 Encounters:  08/31/15 64.91 kg (143 lb 1.6 oz)  08/05/15 64.5 kg (142 lb 3.2 oz)  06/14/11 64.501 kg (142 lb 3.2 oz)    Physical Exam:  Constitutional: She appears well-developed, well nourished. NAD. HENT: Normocephalic. Atraumatic.  Cardiovascular: Normal rate and regular rhythm. + systolic Murmur Respiratory: Effort normal and breath sounds normal. No respiratory distress. no wheezes or rales GI: Soft. Bowel sounds are normal. gtube site appears clean without drainage  Neurological: minimal phonation with attempts to verbalize Arouses quickly to verbal stim   Makes good eye contact when awake.  Follows simple commands DTRs 3+ right upper and right lower extremity Motor:  Right upper extremity and right lower extremity:  0/5 Left upper and left lower extremity >/3/5 or more (patient not participating in MMT) Flexor tone RUE---2/4, tr to1/4 RLE Skin: Skin is warm and dry.  Psych: Withdrawn.  Assessment/Plan: 1. 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 Physiatrist is providing close team supervision and 24 hour management of active  medical problems listed below. Physiatrist and rehab team continue to assess barriers to discharge/monitor patient progress toward functional and medical goals.  Function:  Bathing Bathing position Bathing activity did not occur: N/A Position: Sitting EOB (UB only and night bath)  Bathing parts Body parts bathed by patient: Chest, Abdomen Body parts bathed by helper: Right arm, Left arm  Bathing assist Assist Level:  (Total assist)      Upper Body Dressing/Undressing Upper body dressing Upper body dressing/undressing activity did not occur: N/A What is the patient wearing?: Pull over shirt/dress     Pull over shirt/dress - Perfomed by patient: Thread/unthread left sleeve, Put head through opening Pull over shirt/dress - Perfomed by helper: Thread/unthread right sleeve, Pull shirt over trunk        Upper body assist Assist Level:  (mod assist)      Lower Body Dressing/Undressing Lower body dressing Lower body dressing/undressing activity did not occur: N/A What is the patient wearing?: Pants, Non-skid slipper socks, Ted Hose     Pants- Performed by patient: Thread/unthread left pants leg Pants- Performed by helper: Thread/unthread left pants leg, Thread/unthread right pants leg, Pull pants up/down, Fasten/unfasten pants Non-skid slipper socks- Performed by patient: Don/doff left sock Non-skid slipper socks- Performed by helper: Don/doff right sock   Socks - Performed by helper: Don/doff right sock, Don/doff left sock   Shoes - Performed by helper: Don/doff right shoe, Don/doff left shoe, Fasten right, Fasten left       TED Hose - Performed by helper: Don/doff right TED hose, Don/doff left TED hose  Lower body assist Assist for lower body dressing: 2  Helpers      Naval architect activity did not occur: No continent bowel/bladder event Toileting steps completed by patient: Performs perineal hygiene Toileting steps completed by helper: Adjust clothing prior  to toileting, Performs perineal hygiene, Adjust clothing after toileting Toileting Assistive Devices: Grab bar or rail  Toileting assist Assist level: Two helpers   Transfers Chair/bed transfer   Chair/bed transfer method: Squat pivot Chair/bed transfer assist level: Maximal assist (Pt 25 - 49%/lift and lower) Chair/bed transfer assistive device: Armrests Mechanical lift: Maximove   Locomotion Ambulation Ambulation activity did not occur: Safety/medical concerns   Max distance: 25 Assist level: 2 helpers   Wheelchair   Type: Manual Max wheelchair distance: 100 ft Assist Level: Supervision or verbal cues  Cognition Comprehension Comprehension assist level: Understands basic 75 - 89% of the time/ requires cueing 10 - 24% of the time  Expression Expression assist level: Expresses basic 25 - 49% of the time/requires cueing 50 - 75% of the time. Uses single words/gestures.  Social Interaction Social Interaction assist level: Interacts appropriately 75 - 89% of the time - Needs redirection for appropriate language or to initiate interaction.  Problem Solving Problem solving assist level: Solves basic 50 - 74% of the time/requires cueing 25 - 49% of the time  Memory Memory assist level: Recognizes or recalls 50 - 74% of the time/requires cueing 25 - 49% of the time   Medical Problem List and Plan: 1. 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 DVT Prophylaxis/Anticoagulation: Subcutaneous heparin  3. Pain Management: Hydrocodone as needed 4. Dysphagia. Dysphagia #2 honey thick liquids.  - converted to H2O boluses    -PEG placed and tolerating  -continue megace to help with po appetite---may ultimately need to reduce feeds to better stimulate app, but not now  5. Neuropsych: This patient is not capable of making decisions on her own behalf. 6. Skin/Wound Care: Routine skin checks,     8.  Fluids/Electrolytes/Nutrition: following lytes---recheck next week  -continue repleted potassium 9. Seizure prophylaxis. Keppra held for somnolence EEG neg, stopped per neuro 10. Leukocytosis. Re-check labs in AM 11. Hypertension. Cozaar 100 mg daily. Monitor with increased mobility  -continue current regimen 12. Hyperlipidemia. Lipitor 13. Lethargy post stroke: generally improved. Sleep cycle better  -continue ritalin at 10mg  bid   14. Low grade temp resolved  - asp precautions.   15. Spasticity: PRAFO/WHO RUE RLE  -continue ROM/modalities/taping  -added low dose baclofen. Consider titration up tomorrow 16. Loose stool: now soft/formed  -continue fiber  -continue probiotic     LOS (Days) 20 A FACE TO FACE EVALUATION WAS PERFORMED  SWARTZ,ZACHARY T 08/31/2015 9:03 AM

## 2015-08-31 NOTE — Progress Notes (Signed)
Physical Therapy Session Note  Patient Details  Name: [name] MRN: [id number] Date of Birth: 02-13-1941  Today's Date: 08/31/2015 PT Individual Time: 0905-1030 PT Individual Time Calculation (min): 85 min   Short Term Goals: Week 3:  PT Short Term Goal 1 (Week 3): = LTGs due to anticipated LOS  Skilled Therapeutic Interventions/Progress Updates:   Session focused on RUE/RLE NMR, functional transfers, ambulation, R attention, sitting > standing balance, command following, and safety awareness. Patient in bed, transferred to edge of bed with min A and heavy dependence on rails. Patient performed UB/LB dressing sitting EOB with supervision-min A, max-total multimodal cues for attending to R side of body and sequencing hemi technique. Donned L sock/shoe with supervision and max A for R sock/shoe. Performed sit <> stand with max A with +2 to assist with pulling up pants. Performed squat pivot transfer to wheelchair with max A. From wheelchair level at sink, patient engaged in grooming tasks with max questioning cues due to apraxia, able to identify errors 30% of time but required total A to correct errors (put hairbrush in mouth, identified error with cues and stopped but then attempted to put deodorant in mouth when instructed to brush teeth, etc) and max multimodal cues overall for sequencing tasks.   Patient propelled wheelchair to/from gym using L hemi technique with supervision and no cues for attention to R for obstacle negotiation, max verbal/questioning cues for locking/unlocking B brakes in preparation for transfers. Gait training with focus on sequencing use of hemiwalker and incorporating functional turns, performed TUG using hemiwalker with max faded to min multimodal cues for sequencing, mod-max A for safe placement of RLE, and max cues for safe stepping as patient attempting multiple times to advance LLE without RLE in contact with ground and upright posture/forward gaze, max A  overall = 4 min 55 sec. Performed RLE NMR in standing with raised mat table for LUE support and therapist providing max A for standing balance, instructed in kicking ball with RLE with minimal success and transitioned to RLE stance phase with max A to prevent knee buckling while kicking ball with LLE. Patient instructed in tall kneeling with small bench for BUE support and therapist facilitating RUE weightbearing in extension with max A x 2 with max multimodal cues for keeping hips over knees (pt abducting LLE), hip extension to neutral, upright posture and forward gaze. Patient unable to lift LUE to engage in functional reaching task. Returned to room for toileting, performed stand pivot transfer wheelchair <> commode with +2A for safety and max multimodal cues for safety with clothing management as patient attempting to transfer in middle of task with no safety awareness noted. Patient performed hygiene with setup assist. Patient required max cues for sequencing hand hygiene, left sitting in wheelchair with NT present to eat breakfast.   Therapy Documentation Precautions:  Precautions Precautions: Fall Precaution Comments: R hemiplegia, R neglect, nonverbal Restrictions Weight Bearing Restrictions: No Pain: Pain Assessment Pain Assessment: No/denies pain Faces Pain Scale: No hurt   See Function Navigator for Current Functional Status.   Therapy/Group: Individual Therapy  [name] 08/31/2015, 10:40 AM

## 2015-08-31 NOTE — Progress Notes (Signed)
Occupational Therapy Session Note  Patient Details  Name: [name] MRN: [id number] Date of Birth: 02/26/1941  Today's Date: 08/31/2015 OT Individual Time:  -   1300-1400  (60 min)      Short Term Goals: Week 1:  OT Short Term Goal 1 (Week 1): Pt will participate in self care task for 2 minutes  in order to decrease level of assist with self care.  OT Short Term Goal 1 - Progress (Week 1): Progressing toward goal OT Short Term Goal 2 (Week 1): Pt will locate 3 items to the R during self care tasks by pointing to them in order to demonstrate visual scanning with mod cues. OT Short Term Goal 2 - Progress (Week 1): Progressing toward goal OT Short Term Goal 3 (Week 1): Pt will perform rolls to the left and right with max A or less during LB clothing management and hygiene n order to decrease level of care. OT Short Term Goal 3 - Progress (Week 1): Progressing toward goal Week 2:  OT Short Term Goal 1 (Week 2): Pt will participate in self care task for 2 minutes in order to decrease level of assist with self care. OT Short Term Goal 1 - Progress (Week 2): Progressing toward goal OT Short Term Goal 2 (Week 2): Pt will locate 3 items to the R during self care tasks by pointing to them in order to demonstrate visual scanning with mod cues OT Short Term Goal 2 - Progress (Week 2): Progressing toward goal OT Short Term Goal 3 (Week 2): Pt will perform rolls to the left and right with max A or less during LB clothing management and hygiene in order to decrease burden of care OT Short Term Goal 3 - Progress (Week 2): Met Week 3:  OT Short Term Goal 1 (Week 3): Pt will participate in self care tasks for 2 mins in order to decrease level of assist with self care. OT Short Term Goal 2 (Week 3): Pt will locate 3 items to the R during self care tasks by pointing to them in order to demonstrate visual scanning with mod cues OT Short Term Goal 3 (Week 3): Pt will sit EOB with supervision for 2 mins  while engaged in self care tasks OT Short Term Goal 4 (Week 3): Pt will incorporate hemi dressing techniques (threading RUE/RLE into clothing) with mod verbal cues  Skilled Therapeutic Interventions/Progress Updates:    Pt lying in bed upon OT arrival.  Propelled wc to sink and assisted pr with washing hands. Set up tray for lunch.  Pt took small bites and checked for pocketing with min cues.  Pt did well with eating slow and small portions.  She ate about 25 % of meal. Pt taken to gym in wc.  Positioned at the High Low mat.    Instructted pt  On Self ROM exercises.  Education family on benefits of doing theses exercises when not in therapy,  Pt made one word comments about 50 % of session.  Pt. Propelled self back to h  Therapy Documentation Precautions:  Precautions Precautions: Fall Precaution Comments: R hemiplegia, R neglect, nonverbal Restrictions Weight Bearing Restrictions: No General:  Pain: Pain Assessment Pain Assessment: No/denies pain :    See Function Navigator for Current Functional Status.   Therapy/Group: Individual Therapy  ,  J 08/31/2015, 1:01 PM  

## 2015-09-01 ENCOUNTER — Inpatient Hospital Stay (HOSPITAL_COMMUNITY): Payer: Medicare Other | Admitting: Occupational Therapy

## 2015-09-01 ENCOUNTER — Inpatient Hospital Stay (HOSPITAL_COMMUNITY): Payer: Medicare Other | Admitting: *Deleted

## 2015-09-01 ENCOUNTER — Inpatient Hospital Stay (HOSPITAL_COMMUNITY): Payer: Medicare Other | Admitting: Physical Therapy

## 2015-09-01 ENCOUNTER — Inpatient Hospital Stay (HOSPITAL_COMMUNITY): Payer: Medicare Other | Admitting: Speech Pathology

## 2015-09-01 LAB — CBC
HCT: 34.4 % — ABNORMAL LOW (ref 36.0–46.0)
HEMOGLOBIN: 11.5 g/dL — AB (ref 12.0–15.0)
MCH: 29.8 pg (ref 26.0–34.0)
MCHC: 33.4 g/dL (ref 30.0–36.0)
MCV: 89.1 fL (ref 78.0–100.0)
PLATELETS: 267 10*3/uL (ref 150–400)
RBC: 3.86 MIL/uL — AB (ref 3.87–5.11)
RDW: 14.2 % (ref 11.5–15.5)
WBC: 10.1 10*3/uL (ref 4.0–10.5)

## 2015-09-01 LAB — BASIC METABOLIC PANEL
ANION GAP: 10 (ref 5–15)
BUN: 20 mg/dL (ref 6–20)
CO2: 23 mmol/L (ref 22–32)
Calcium: 9.2 mg/dL (ref 8.9–10.3)
Chloride: 103 mmol/L (ref 101–111)
Creatinine, Ser: 0.63 mg/dL (ref 0.44–1.00)
Glucose, Bld: 115 mg/dL — ABNORMAL HIGH (ref 65–99)
Potassium: 4 mmol/L (ref 3.5–5.1)
SODIUM: 136 mmol/L (ref 135–145)

## 2015-09-01 LAB — GLUCOSE, CAPILLARY
GLUCOSE-CAPILLARY: 111 mg/dL — AB (ref 65–99)
GLUCOSE-CAPILLARY: 114 mg/dL — AB (ref 65–99)
Glucose-Capillary: 118 mg/dL — ABNORMAL HIGH (ref 65–99)

## 2015-09-01 MED ORDER — BACLOFEN 1 MG/ML ORAL SUSPENSION
10.0000 mg | Freq: Two times a day (BID) | ORAL | Status: DC
  Administered 2015-09-01 – 2015-09-04 (×6): 10 mg
  Filled 2015-09-01 (×6): qty 1

## 2015-09-01 NOTE — Progress Notes (Signed)
Speech Language Pathology Daily Session Note  Patient Details  Name: [name] MRN: [id number] Date of Birth: [birth year]  Today's Date: 09/01/2015 SLP Individual Time: 4628 (make up time)-1000 SLP Individual Time Calculation (min): 27 min  Short Term Goals: Week 1: SLP Short Term Goal 1 (Week 1): Patient will maintain arousal in regards to keeping her eyes open for ~60 seconds with Max A multimodal cues.  SLP Short Term Goal 1 - Progress (Week 1): Met SLP Short Term Goal 2 (Week 1): Patient will follow basic 1 step commands in 50% of opporunities with Max A multimodal cues.  SLP Short Term Goal 2 - Progress (Week 1): Met SLP Short Term Goal 3 (Week 1): Patient will answer basic yes/no questions utilizing multimodal communication with 50% accuracy with Max A multimodal cues.  SLP Short Term Goal 3 - Progress (Week 1): Met SLP Short Term Goal 4 (Week 1): Patient will consume current diet without overt s/s of aspiration with Mod A verbal cues for use of swallowing compensatory strategies.  SLP Short Term Goal 4 - Progress (Week 1): Not met  Skilled Therapeutic Interventions: Skilled treatment session focused on addressing cognitive-linguistic goals. Patient demonstrate adequate arousal and sustained attention to task without need for cues.  SLP facilitated session by assessing needs with yes/no questions, extra time, and Mod assist visual cues.  Patient named familiar objects with 1/10  Independently and 10/10 with Max assist sentence completion and phonemic cues.  Patient errors were perseverative in nature as well as semantic and she required Max assist to self-monitor and correct.  Continue with current plan of care.    Function:  Cognition Comprehension Comprehension assist level: Understands basic 75 - 89% of the time/ requires cueing 10 - 24% of the time  Expression   Expression assist level: Expresses basic 25 - 49% of the time/requires cueing 50 - 75% of the time. Uses single  words/gestures.  Social Interaction Social Interaction assist level: Interacts appropriately 75 - 89% of the time - Needs redirection for appropriate language or to initiate interaction.  Problem Solving Problem solving assist level: Solves basic 50 - 74% of the time/requires cueing 25 - 49% of the time  Memory Memory assist level: Recognizes or recalls 50 - 74% of the time/requires cueing 25 - 49% of the time    Pain Pain Assessment Pain Assessment: Faces Pain Score: 6  Faces Pain Scale: Hurts even more Pain Type: Acute pain Pain Location: Arm Pain Orientation: Right Pain Radiating Towards: arm Pain Descriptors / Indicators: Grimacing Pain Frequency: Intermittent Pain Onset: On-going Patients Stated Pain Goal: 4 Pain Intervention(s): RN made aware Multiple Pain Sites: No  Therapy/Group: Individual Therapy  Carmelia Roller., [place]  [place] 09/01/2015, 10:14 AM

## 2015-09-01 NOTE — Progress Notes (Signed)
Physical Therapy Session Note  Patient Details  Name: [name] MRN: [id number] Date of Birth: March 29, 1941  Today's Date: 09/01/2015 PT Individual Time: 1100-1154 PT Individual Time Calculation (min): 54 min   Short Term Goals: Week 1:  PT Short Term Goal 1 (Week 1): Patient will maintain arousal for 60 min consistently with min multimodal cues. PT Short Term Goal 1 - Progress (Week 1): Not met PT Short Term Goal 2 (Week 1): Patient will follow basic 1 step commands in 75% of available opportunities.  PT Short Term Goal 2 - Progress (Week 1): Not met PT Short Term Goal 3 (Week 1): Patient will maintain sitting balance x 5 min with supervision.  PT Short Term Goal 3 - Progress (Week 1): Not met PT Short Term Goal 4 (Week 1): Patient will initiate ambulation.  PT Short Term Goal 4 - Progress (Week 1): Not met Week 2:  PT Short Term Goal 1 (Week 2): Patient will maintain arousal for 30 min consistently with min multimodal cues. PT Short Term Goal 1 - Progress (Week 2): Progressing toward goal PT Short Term Goal 2 (Week 2): Patient will follow basic 1 step commands in 75% of available opportunities.  PT Short Term Goal 2 - Progress (Week 2): Met PT Short Term Goal 3 (Week 2): Patient will maintain sitting balance x 5 min with supervision.  PT Short Term Goal 3 - Progress (Week 2): Met PT Short Term Goal 4 (Week 2): Patient will stand with use of equipment x 5 min.  PT Short Term Goal 4 - Progress (Week 2): Met  Therapy Documentation Precautions:  Precautions Precautions: Fall Precaution Comments: R hemiplegia, R neglect, nonverbal Restrictions Weight Bearing Restrictions: No  Patient received seated at nursing station.   Sit to and from stand transfer mod assist with left hemi walker.  Patient up and down 4(6inch) steps and 8 (4inch) steps with left handrail. Patient performed stairs with a step to pattern. Verbal cues for sequence and technique. Patient required manual  facilitation for upright posture hip extension, weight shifting and advancement, placement and stabilization of RLE.   Patient performed step taps on 4 inch block with left hemiwalker, in order to increase weight bearing of right lower extremity. Patient required mod assist overall with approximation applied at left knee for proprioceptive feedback. Manual facilitation for lateral weight shifting and hip extension.   Patient ambulated 40 and 35 feet with left hemiwalker max assist and +2 for wheelchair follow. Patient required manual facilitation for upright posture hip extension, weight shifting and advancement, placement and stabilization of RLE. One step verbal cues for throughout for sequence and technique.  Patient returned to nurses station with all needs met and quick release belt engaged. Patient required rest breaks throughout session.     See Function Navigator for Current Functional Status.   Therapy/Group: Individual Therapy  Retta Diones 09/01/2015, 12:07 PM

## 2015-09-01 NOTE — Progress Notes (Signed)
Occupational Therapy Session Note  Patient Details  Name: [name] MRN: [id number] Date of Birth: 1940/12/18  Today's Date: 09/01/2015 OT Individual Time: [id number] OT Individual Time Calculation (min): 70 min    Short Term Goals: Week 3:  OT Short Term Goal 1 (Week 3): Pt will participate in self care tasks for 2 mins in order to decrease level of assist with self care. OT Short Term Goal 2 (Week 3): Pt will locate 3 items to the R during self care tasks by pointing to them in order to demonstrate visual scanning with mod cues OT Short Term Goal 3 (Week 3): Pt will sit EOB with supervision for 2 mins while engaged in self care tasks OT Short Term Goal 4 (Week 3): Pt will incorporate hemi dressing techniques (threading RUE/RLE into clothing) with mod verbal cues  Skilled Therapeutic Interventions/Progress Updates: Patient participated in skilled OT as follows:  Static balance EOB=Min A due to occasional Left lateral leans  Bed to w/c chair to right hemiside of body=max assist stand pivot with tactile turning of right foot  W/c to/fr 3:1 transfer=mod A stand pivot  Toileting=total A for cleansing and changing brief-patient assisted with unfastening/fastening brief and attempted to wipe and cleanse periarea.  LB doffing of pants and socks=max A due to poor balance (patient wanted to change in to hospital gown)  Patient tolerated Left UE ROM - with pectoralis very tight and requiring much time to stretch in order to increase right shoulder abduction and horizontal abduction  W/c to edge of bed transfer= max A  Body mobility and positionoing =max A ------------------------------------  Husband present intially at session and wanted to know if she had prior therapies earlier in the day and then he left go home for dinner and told his wife he would return at 7pm this evening  Grand dtr arrived shortly after patient's husband left       Therapy Documentation Precautions:   Precautions Precautions: Fall Precaution Comments: R hemiplegia, R neglect, nonverbal Restrictions Weight Bearing Restrictions: No   Pain:denied    See Function Navigator for Current Functional Status.   Therapy/Group: Individual Therapy  Alfredia Ferguson [facility] 09/01/2015, 7:15 PM

## 2015-09-01 NOTE — Progress Notes (Signed)
Occupational Therapy Session Note  Patient Details  Name: [name] MRN: [id number] Date of Birth: 12-25-1940  Today's Date: 09/01/2015 OT Individual Time: 1000-1100 OT Individual Time Calculation (min): 60 min    Short Term Goals: Week 3:  OT Short Term Goal 1 (Week 3): Pt will participate in self care tasks for 2 mins in order to decrease level of assist with self care. OT Short Term Goal 2 (Week 3): Pt will locate 3 items to the R during self care tasks by pointing to them in order to demonstrate visual scanning with mod cues OT Short Term Goal 3 (Week 3): Pt will sit EOB with supervision for 2 mins while engaged in self care tasks OT Short Term Goal 4 (Week 3): Pt will incorporate hemi dressing techniques (threading RUE/RLE into clothing) with mod verbal cues  Skilled Therapeutic Interventions/Progress Updates:    Pt resting in bed upon arrival.  Pt agreeable to participating in therapy.  Pt incontinent of bladder and required max A for hygiene/changing brief.  Pt assisted with fastening her brief.  Pt completed UB bathing and all dressing tasks while seated EOB with min A and max verbal cues for sitting balance when engaged in functional task.  Pt continues to exhibit some impulsivity requiring max multimodal cues to slow down and/or stop.  Pt continues to require max multimodal cues for hemi dressing techniques.  Pt completed grooming tasks seated in w/c at sink with max multimodal cues (tactile cues and gestures/demonstration). Pt responded appropriately with yes/no responses when asked yes/no questions.  Focus on activity tolerance, bed mobility, sitting balance, functional transfers, following one step commands, task initiation, sequencing, and safety awareness to increase independence with BADLs and decrease burden of care.   Therapy Documentation Precautions:  Precautions Precautions: Fall Precaution Comments: R hemiplegia, R neglect, nonverbal Restrictions Weight Bearing  Restrictions: No   Pain: No s/s of pain  See Function Navigator for Current Functional Status.   Therapy/Group: Individual Therapy  [name] 09/01/2015, 11:21 AM

## 2015-09-01 NOTE — Progress Notes (Addendum)
[place] [facility] PHYSICAL MEDICINE & REHABILITATION     PROGRESS NOTE    Subjective/Complaints: Slept well. Awake sitting in bed this am. Appears comfortable  ROS: remain limited due to cognitive status  Objective: Vital Signs: Blood pressure 127/63, pulse 73, temperature 98.6 F (37 C), temperature source Oral, resp. rate 18, weight 65.772 kg (145 lb), SpO2 99 %. No results found.  Recent Labs  09/01/15 0806  WBC 10.1  HGB 11.5*  HCT 34.4*  PLT 267   No results for input(s): NA, K, CL, GLUCOSE, BUN, CREATININE, CALCIUM in the last 72 hours.  Invalid input(s): CO CBG (last 3)   Recent Labs  08/31/15 2008 09/01/15 0038 09/01/15 0354  GLUCAP 124* 111* 118*    Wt Readings from Last 3 Encounters:  09/01/15 65.772 kg (145 lb)  08/05/15 64.5 kg (142 lb 3.2 oz)  06/14/11 64.501 kg (142 lb 3.2 oz)    Physical Exam:  Constitutional: She appears well-developed, well nourished. NAD. HENT: Normocephalic. Atraumatic.  Cardiovascular: Normal rate and regular rhythm. + systolic Murmur Respiratory: Effort normal and breath sounds normal. No respiratory distress. no wheezes or rales GI: Soft. Bowel sounds are normal. gtube site appears clean without drainage  Neurological: non-verbal for me Alert.  Makes good eye contact when awake.  Follows simple commands DTRs 3+ right upper and right lower extremity Motor:  Right upper extremity and right lower extremity:  0/5 Left upper and left lower extremity >/3/5 or more (patient not participating in MMT) Flexor tone RUE---2-3/4, tr to1/4 RLE Skin: Skin is warm and dry.  Psych: Withdrawn.  Assessment/Plan: 1. 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 Physiatrist is providing close team supervision and 24 hour management of active medical problems listed below. Physiatrist and rehab team continue  to assess barriers to discharge/monitor patient progress toward functional and medical goals.  Function:  Bathing Bathing position Bathing activity did not occur: N/A Position: Sitting EOB (UB only and night bath)  Bathing parts Body parts bathed by patient: Chest, Abdomen Body parts bathed by helper: Right arm, Left arm  Bathing assist Assist Level:  (Total assist)      Upper Body Dressing/Undressing Upper body dressing Upper body dressing/undressing activity did not occur: N/A What is the patient wearing?: Pull over shirt/dress     Pull over shirt/dress - Perfomed by patient: Thread/unthread left sleeve, Put head through opening Pull over shirt/dress - Perfomed by helper: Thread/unthread right sleeve, Pull shirt over trunk        Upper body assist Assist Level:  (mod assist)      Lower Body Dressing/Undressing Lower body dressing Lower body dressing/undressing activity did not occur: N/A What is the patient wearing?: Pants, Non-skid slipper socks, Ted Hose     Pants- Performed by patient: Thread/unthread left pants leg Pants- Performed by helper: Thread/unthread left pants leg, Thread/unthread right pants leg, Pull pants up/down, Fasten/unfasten pants Non-skid slipper socks- Performed by patient: Don/doff left sock Non-skid slipper socks- Performed by helper: Don/doff right sock   Socks - Performed by helper: Don/doff right sock, Don/doff left sock   Shoes - Performed by helper: Don/doff right shoe, Don/doff left shoe, Fasten right, Fasten left       TED Hose - Performed by helper: Don/doff right TED hose, Don/doff left TED hose  Lower body assist Assist for lower body dressing: 2 Helpers      Toileting  Toileting Toileting activity did not occur: No continent bowel/bladder event Toileting steps completed by patient: Performs perineal hygiene Toileting steps completed by helper: Adjust clothing prior to toileting, Adjust clothing after toileting Toileting Assistive  Devices: Grab bar or rail  Toileting assist Assist level: Two helpers   Transfers Chair/bed transfer   Chair/bed transfer method: Squat pivot Chair/bed transfer assist level: Maximal assist (Pt 25 - 49%/lift and lower) Chair/bed transfer assistive device: Armrests Mechanical lift: Maximove   Locomotion Ambulation Ambulation activity did not occur: Safety/medical concerns   Max distance: 20 Assist level: Maximal assist (Pt 25 - 49%)   Wheelchair   Type: Manual Max wheelchair distance: 100 ft Assist Level: Supervision or verbal cues  Cognition Comprehension Comprehension assist level: Understands basic 75 - 89% of the time/ requires cueing 10 - 24% of the time  Expression Expression assist level: Expresses basic 25 - 49% of the time/requires cueing 50 - 75% of the time. Uses single words/gestures.  Social Interaction Social Interaction assist level: Interacts appropriately 75 - 89% of the time - Needs redirection for appropriate language or to initiate interaction.  Problem Solving Problem solving assist level: Solves basic 50 - 74% of the time/requires cueing 25 - 49% of the time  Memory Memory assist level: Recognizes or recalls 50 - 74% of the time/requires cueing 25 - 49% of the time   Medical Problem List and Plan: 1. 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 DVT Prophylaxis/Anticoagulation: Subcutaneous heparin  3. Pain Management: Hydrocodone as needed 4. Dysphagia. Dysphagia #2 honey thick liquids.  - converted to H2O boluses    -PEG placed and tolerating  -continue megace to help with po appetite---may ultimately need to reduce feeds to better stimulate app, but not now  5. Neuropsych: This patient is not capable of making decisions on her own behalf. 6. Skin/Wound Care: Routine skin checks,     8.  Fluids/Electrolytes/Nutrition: following lytes---follow up labs  -continue repleted potassium 9. Seizure prophylaxis. Keppra held for somnolence EEG neg, stopped per neuro 10. Leukocytosis. Wbc's down to 10.1  -I personally reviewed the patient's labs today.  11. Hypertension. Cozaar 100 mg daily. Monitor with increased mobility  -continue current regimen 12. Hyperlipidemia. Lipitor 13. Lethargy post stroke: generally improved. Sleep cycle improved  -continue ritalin at 10mg  bid   14. Low grade temp resolved  - asp precautions.   15. Spasticity: PRAFO/WHO RUE RLE  -continue ROM/modalities/taping  -increase baclofen to 10mg  bid 16. Loose stool: now soft/formed  -continue fiber  -continue probiotic     LOS (Days) 21 A FACE TO FACE EVALUATION WAS PERFORMED  SWARTZ,ZACHARY T 09/01/2015 9:00 AM

## 2015-09-01 NOTE — Progress Notes (Signed)
Speech Language Pathology Daily Session Note  Patient Details  Name: [name] MRN: [id number] Date of Birth: 12/08/1940  Today's Date: 09/01/2015 SLP Individual Time: 1445-1530 SLP Individual Time Calculation (min): 45 min  Short Term Goals: Week 3: SLP Short Term Goal 1 (Week 3): Patient will consume current diet without overt s/s of aspiration with supervision verbal cues for use of swallowing compensatory strategies.  SLP Short Term Goal 2 (Week 3): Patient will demonstrate efficient mastication of Dys. 2 textures with complete oral clearance over 2 consecutive sessions prior to upgrade with Min A verbal cues.  SLP Short Term Goal 3 (Week 3): Patient will consume trials of nectar-thick liquids with minimal overt s/s of aspiration (30% of trials) over 2 session to asess readiness for repeat MBS.  SLP Short Term Goal 4 (Week 3): Patient will name functional items at the word level with Max A multimodal cues in 75% of opportunities.  SLP Short Term Goal 5 (Week 3): Patient will vocalize with Max A multimodal cues in 75% of opportunities  SLP Short Term Goal 6 (Week 3): Patient will answer basic yes/no questions utilizing multimodal communication with 75% accuracy with Mod A multimodal cues.   Skilled Therapeutic Interventions: Skilled treatment session focused on dysphagia and functional communication goals. SLP facilitated trials of nectar-thick liquids via tsp and cup. Patient consumed trials without overt s/s of aspiration but continues to demonstrate what appears to be a delayed swallow initiation. Recommend MBS prior to upgrade. SLP also facilitated session by providing Mod A semantic and phonemic cues for naming functional items and Mod-Max A semantic and sentence completion cues to compare/contrast items at the word and phrase level. Patient was able to vocalize with Mod I throughout the entire session. Patient left upright in bed with alarm on and all needs within reach. Continue with  current plan of care.    Function:  Eating Eating   Modified Consistency Diet: Yes Eating Assist Level: Supervision or verbal cues;Set up assist for   Eating Set Up Assist For: Opening containers       Cognition Comprehension Comprehension assist level: Understands basic 75 - 89% of the time/ requires cueing 10 - 24% of the time  Expression   Expression assist level: Expresses basic 25 - 49% of the time/requires cueing 50 - 75% of the time. Uses single words/gestures.  Social Interaction Social Interaction assist level: Interacts appropriately 75 - 89% of the time - Needs redirection for appropriate language or to initiate interaction.  Problem Solving Problem solving assist level: Solves basic 50 - 74% of the time/requires cueing 25 - 49% of the time  Memory Memory assist level: Recognizes or recalls 50 - 74% of the time/requires cueing 25 - 49% of the time    Pain No/Denies Pain   Therapy/Group: Individual Therapy  Kellin Bartling 09/01/2015, 3:56 PM

## 2015-09-02 ENCOUNTER — Inpatient Hospital Stay (HOSPITAL_COMMUNITY): Payer: Medicare Other | Admitting: Speech Pathology

## 2015-09-02 ENCOUNTER — Inpatient Hospital Stay (HOSPITAL_COMMUNITY): Payer: Medicare Other

## 2015-09-02 ENCOUNTER — Inpatient Hospital Stay (HOSPITAL_COMMUNITY): Payer: Medicare Other | Admitting: Physical Therapy

## 2015-09-02 NOTE — Progress Notes (Signed)
Nutrition Follow-up  DOCUMENTATION CODES:   Severe malnutrition in context of acute illness/injury  INTERVENTION:  -Continue Jevity 1.5 formula via PEG @ goal rate of 70 ml/hr (840 ml/day) for 12 hours overnight (7 pm-7 am) with 60 ml prostat. This will provide 1460 kcals (meets 88% needs), 84 g protein (100% of needs), and 639 ml of free water. -Continue free water flushes of 250 mls every 8 hours. -NDD2 - Honey Thick Liquids per SLP  NUTRITION DIAGNOSIS:   Malnutrition related to acute illness as evidenced by energy intake < or equal to 50% for > or equal to 5 days, moderate depletions of muscle mass. -ongoing  GOAL:   Patient will meet greater than or equal to 90% of their needs -meeting  MONITOR:   PO intake, Diet advancement, TF tolerance, Weight trends, Labs, I & O's  REASON FOR ASSESSMENT:   Consult Enteral/tube feeding initiation and management  ASSESSMENT:   75 y.o. right handed female with history of hypertension. 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  -Pt tolerating tube feed no problems.  -PO intake has been very poor even on Megace -Per RN, patient has stopped eating "Because of tubefeed." Family states she has done this before. Pt feels like she does not have to eat because she is receiving tubefeed.  -Unsure if patient may feel full throughout day as a result of nocturnal feeds, but is unlikely. -Will continue to monitor -PO averaging 12.5% last 8 meals. -Pt is full supervision feeder   Diet Order:  DIET - DYS 1 Room service appropriate?: Yes; Fluid consistency:: Honey Thick  Skin:  Reviewed, no issues  Last BM:  5/10  Height:   Ht Readings from Last 1 Encounters:  08/01/15 5\' 4"  (1.626 m)    Weight:   Wt Readings from Last 1 Encounters:  09/02/15 144 lb 10 oz (65.6 kg)    Ideal Body Weight:  54.5 kg  BMI:  Body mass index is 24.81  kg/(m^2).  Estimated Nutritional Needs:   Kcal:  1650-1850  Protein:  70-85 grams  Fluid:  1.6 - 1.8 L/day  EDUCATION NEEDS:   No education needs identified at this time  Satira Anis. Thao Vanover, MS, RD LDN Inpatient Clinical Dietitian Pager [phone]

## 2015-09-02 NOTE — Progress Notes (Signed)
Occupational Therapy Session Note  Patient Details  Name: NIKCOLE GRAF MRN: [id number] Date of Birth: May 11, 1940  Today's Date: 09/02/2015 OT Individual Time: 1000-1100 OT Individual Time Calculation (min): 60 min    Short Term Goals: Week 3:  OT Short Term Goal 1 (Week 3): Pt will participate in self care tasks for 2 mins in order to decrease level of assist with self care. OT Short Term Goal 2 (Week 3): Pt will locate 3 items to the R during self care tasks by pointing to them in order to demonstrate visual scanning with mod cues OT Short Term Goal 3 (Week 3): Pt will sit EOB with supervision for 2 mins while engaged in self care tasks OT Short Term Goal 4 (Week 3): Pt will incorporate hemi dressing techniques (threading RUE/RLE into clothing) with mod verbal cues  Skilled Therapeutic Interventions/Progress Updates:    Pt resting in w/c upon arrival with husband present.  Pt dressed in clean clothing from previous therapy session.  Pt initially engaged in [place] but exhibits difficulty eliciting active movement in RUE.  Pt engaged in [place] through [place]. Pt transitioned to grooming tasks seated at sink.  Pt required demonstration/gesture cues and max multimodal cues to allow therapist to place toothpaste on toothbrush.  Pt remained in w/c with half lap tray in place and her son present.   Therapy Documentation Precautions:  Precautions Precautions: Fall Precaution Comments: R hemiplegia, R neglect, nonverbal Restrictions Weight Bearing Restrictions: No   Pain: Pain Assessment Pain Assessment: No/denies pain Pain Score: 0-No pain  See Function Navigator for Current Functional Status.   Therapy/Group: Individual Therapy  [name] 09/02/2015, 12:38 PM

## 2015-09-02 NOTE — Progress Notes (Signed)
[place] PHYSICAL MEDICINE & REHABILITATION     PROGRESS NOTE    Subjective/Complaints: Lying in bed awake. No distress. Denies pain.  ROS: remain limited due to cognitive status  Objective: Vital Signs: Blood pressure 128/82, pulse 77, temperature 97.8 F (36.6 C), temperature source Oral, resp. rate 17, weight 65.6 kg (144 lb 10 oz), SpO2 100 %. No results found.  Recent Labs  09/01/15 0806  WBC 10.1  HGB 11.5*  HCT 34.4*  PLT 267    Recent Labs  09/01/15 0806  NA 136  K 4.0  CL 103  GLUCOSE 115*  BUN 20  CREATININE 0.63  CALCIUM 9.2   CBG (last 3)   Recent Labs  09/01/15 0038 09/01/15 0354 09/01/15 0812  GLUCAP 111* 118* 114*    Wt Readings from Last 3 Encounters:  09/02/15 65.6 kg (144 lb 10 oz)  08/05/15 64.5 kg (142 lb 3.2 oz)  06/14/11 64.501 kg (142 lb 3.2 oz)    Physical Exam:  Constitutional: She appears well-developed, well nourished. NAD. HENT: Normocephalic. Atraumatic.  Cardiovascular: Normal rate and regular rhythm. + systolic Murmur Respiratory: Effort normal and breath sounds normal. No respiratory distress. no wheezes or rales GI: Soft. Bowel sounds are normal. gtube site appears clean without drainage  Neurological: non-verbal for me Alert.  Makes good eye contact when awake.  Follows simple commands DTRs 3+ right upper and right lower extremity Motor:  Right upper extremity and right lower extremity:  0/5 Left upper and left lower extremity >/3/5 or more (patient not participating in MMT) Flexor tone RUE---1-2/4, tr to1/4 RLE Skin: Skin is warm and dry.  Psych:  Calm/flat  Assessment/Plan: 1. 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 Physiatrist is providing close team supervision and 24 hour management of active medical problems listed below. Physiatrist and rehab team continue to  assess barriers to discharge/monitor patient progress toward functional and medical goals.  Function:  Bathing Bathing position Bathing activity did not occur: N/A Position: Sitting EOB (and bed level)  Bathing parts Body parts bathed by patient: Right arm, Chest, Abdomen, Front perineal area, Right upper leg, Left upper leg Body parts bathed by helper: Left arm, Buttocks, Right lower leg, Left lower leg  Bathing assist Assist Level:  (Total assist)      Upper Body Dressing/Undressing Upper body dressing Upper body dressing/undressing activity did not occur: N/A What is the patient wearing?: Pull over shirt/dress     Pull over shirt/dress - Perfomed by patient: Thread/unthread left sleeve, Put head through opening, Pull shirt over trunk Pull over shirt/dress - Perfomed by helper: Thread/unthread right sleeve, Pull shirt over trunk        Upper body assist Assist Level: Touching or steadying assistance(Pt > 75%)      Lower Body Dressing/Undressing Lower body dressing Lower body dressing/undressing activity did not occur: N/A What is the patient wearing?: Pants, Ted Hose, Non-skid slipper socks     Pants- Performed by patient: Thread/unthread left pants leg Pants- Performed by helper: Thread/unthread right pants leg, Pull pants up/down, Fasten/unfasten pants Non-skid slipper socks- Performed by patient: Don/doff left sock Non-skid slipper socks- Performed by helper: Don/doff right sock   Socks - Performed by helper: Don/doff right sock, Don/doff left sock   Shoes - Performed by helper: Don/doff right shoe, Don/doff left shoe, Fasten right, Fasten left       TED Hose -  Performed by helper: Don/doff right TED hose, Don/doff left TED hose  Lower body assist Assist for lower body dressing: 2 Helpers      Toileting Toileting Toileting activity did not occur: No continent bowel/bladder event Toileting steps completed by patient: Performs perineal hygiene Toileting steps  completed by helper: Adjust clothing prior to toileting, Adjust clothing after toileting Toileting Assistive Devices: Grab bar or rail  Toileting assist Assist level: Two helpers   Transfers Chair/bed transfer   Chair/bed transfer method: Squat pivot Chair/bed transfer assist level: Maximal assist (Pt 25 - 49%/lift and lower) Chair/bed transfer assistive device: Armrests Mechanical lift: Maximove   Locomotion Ambulation Ambulation activity did not occur: Safety/medical concerns   Max distance: 40 Assist level: Maximal assist (Pt 25 - 49%)   Wheelchair   Type: Manual Max wheelchair distance: 150 Assist Level: Supervision or verbal cues  Cognition Comprehension Comprehension assist level: Understands basic 75 - 89% of the time/ requires cueing 10 - 24% of the time  Expression Expression assist level: Expresses basic 25 - 49% of the time/requires cueing 50 - 75% of the time. Uses single words/gestures.  Social Interaction Social Interaction assist level: Interacts appropriately 50 - 74% of the time - May be physically or verbally inappropriate.  Problem Solving Problem solving assist level: Solves basic 50 - 74% of the time/requires cueing 25 - 49% of the time  Memory Memory assist level: Recognizes or recalls 50 - 74% of the time/requires cueing 25 - 49% of the time   Medical Problem List and Plan: 1. 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 DVT Prophylaxis/Anticoagulation: Subcutaneous heparin  3. Pain Management: Hydrocodone as needed 4. Dysphagia. Dysphagia #1 honey thick liquids.--->MBS today  - converted to H2O boluses    -PEG placed and tolerating  -continue megace to help with po appetite---may ultimately need to reduce feeds to better stimulate app, but not now  5. Neuropsych: This  patient is not capable of making decisions on her own behalf. 6. Skin/Wound Care: Routine skin checks,     8. Fluids/Electrolytes/Nutrition: following lytes---follow up labs reviewed   -potassium normal 9. Seizure prophylaxis. Keppra held for somnolence EEG neg, stopped per neuro 10. Leukocytosis. Wbc's down to 10.1  -I personally reviewed the patient's labs today.  11. Hypertension. Cozaar 100 mg daily. Monitor with increased mobility  -continue current regimen 12. Hyperlipidemia. Lipitor 13. Lethargy post stroke: generally improved. Sleep cycle improved  -continue ritalin at 10mg  bid   14. Low grade temp resolved  - asp precautions.   15. Spasticity: PRAFO/WHO RUE RLE  -continue ROM/modalities/taping  -increased baclofen to 10mg  bid---good results so far 16. Loose stool: now soft/formed  -continue fiber  -continue probiotic     LOS (Days) 22 A FACE TO FACE EVALUATION WAS PERFORMED  Dovber Ernest T 09/02/2015 8:24 AM

## 2015-09-02 NOTE — Plan of Care (Signed)
Problem: RH BLADDER ELIMINATION Goal: RH STG MANAGE BLADDER WITH ASSISTANCE STG Manage Bladder With max Assistance  Outcome: Progressing Continent with timed toileting

## 2015-09-02 NOTE — Progress Notes (Signed)
Physical Therapy Session Note  Patient Details  Name: [name] MRN: [id number] Date of Birth: [birth year]  Today's Date: 09/02/2015 PT Individual Time: 0900-1000 PT Individual Time Calculation (min): 60 min   Short Term Goals: Week 3:  PT Short Term Goal 1 (Week 3): = LTGs due to anticipated LOS  Skilled Therapeutic Interventions/Progress Updates:   Patient awake in bed upon arrival. Transferred to edge of bed with patient demonstrating good attention to RLE but requiring cues to problem solve how to safely get RLE off bed by hooking LLE under RLE, min A overall. Session focused on dynamic sitting balance with S-min A during clothing management with max multimodal cues for sequencing and attending to R and patient recalling L hemi technique for UB dressing but requiring total A for LB dressing, squat pivot and stand pivot transfers bed > wheelchair <> toilet with max A and max multimodal cues for safety awareness with patient attempting to transfer while pulling up pants, standing balance with max-total A for clothing management, patient provided with washcloth for hygiene after toileting and urinary incontinence and began washing face and body requiring cues to wash perineal area, max multimodal cues to attend to RUE for hand hygiene at wheelchair level, therapist providing supervision and max multimodal cues to attend to R side of breakfast tray while patient consumed several very small bites until she indicated she was finished, and wheelchair propulsion to/from ortho gym via L hemi technique with supervision and patient self-correcting when running into obstacles on R. Patient's husband present and asking if patient could ride in car at time of planned DC to SNF. Performed simulated car transfer via stand pivot transfer x 2 with max A and max multimodal cues for safe sequencing, hand placement and technique and patient initiating lifting with LUE but required assist to successfully lift RLE  in/out of car. Patient also demonstrated strong pushing tendencies to R in standing this date. Discussed with patient's husband need to practice with actual car and with family member who would be assisting patient for family training prior to discharge. Patient's husband reports he would not be able to assist patient due to physical limitations. Patient left sitting in wheelchair with husband present, handoff to OT.   Therapy Documentation Precautions:  Precautions Precautions: Fall Precaution Comments: R hemiplegia, R neglect, nonverbal Restrictions Weight Bearing Restrictions: No Pain: Pain Assessment Pain Assessment: No/denies pain Pain Score: 0-No pain   See Function Navigator for Current Functional Status.   Therapy/Group: Individual Therapy  [name] 09/02/2015, 10:08 AM

## 2015-09-02 NOTE — Progress Notes (Signed)
MBSS complete. Full report located under chart review in imaging section. Recommend initiation of a Dys.2 texture and thin liquid diet with continued full supervision and SLP follow up.  Gunnar Fusi, M.A., CCC-SLP [phone]

## 2015-09-03 ENCOUNTER — Inpatient Hospital Stay (HOSPITAL_COMMUNITY): Payer: Medicare Other | Admitting: Speech Pathology

## 2015-09-03 ENCOUNTER — Inpatient Hospital Stay (HOSPITAL_COMMUNITY): Payer: Medicare Other

## 2015-09-03 ENCOUNTER — Inpatient Hospital Stay (HOSPITAL_COMMUNITY): Payer: Medicare Other | Admitting: Physical Therapy

## 2015-09-03 MED ORDER — BISACODYL 10 MG RE SUPP
10.0000 mg | Freq: Once | RECTAL | Status: AC
  Administered 2015-09-03: 10 mg via RECTAL
  Filled 2015-09-03: qty 1

## 2015-09-03 NOTE — Progress Notes (Signed)
Speech Language Pathology Weekly Progress and Session Note  Patient Details  Name: [name] MRN: [id number] Date of Birth: 05/15/1940  Beginning of progress report period: Aug 26, 2015 End of progress report period: Sep 03, 2015  Today's Date: 09/03/2015 SLP Individual Time: 1100-1200 SLP Individual Time Calculation (min): 60 min  Short Term Goals: Week 3: SLP Short Term Goal 1 (Week 3): Patient will consume current diet without overt s/s of aspiration with supervision verbal cues for use of swallowing compensatory strategies.  SLP Short Term Goal 1 - Progress (Week 3): Met SLP Short Term Goal 2 (Week 3): Patient will demonstrate efficient mastication of Dys. 2 textures with complete oral clearance over 2 consecutive sessions prior to upgrade with Min A verbal cues.  SLP Short Term Goal 2 - Progress (Week 3): Updated due to goal met SLP Short Term Goal 3 (Week 3): Patient will consume trials of nectar-thick liquids with minimal overt s/s of aspiration (30% of trials) over 2 session to asess readiness for repeat MBS.  SLP Short Term Goal 3 - Progress (Week 3): Met SLP Short Term Goal 4 (Week 3): Patient will name functional items at the word level with Max A multimodal cues in 75% of opportunities.  SLP Short Term Goal 4 - Progress (Week 3): Progressing toward goal SLP Short Term Goal 5 (Week 3): Patient will vocalize with Max A multimodal cues in 75% of opportunities  SLP Short Term Goal 5 - Progress (Week 3): Met SLP Short Term Goal 6 (Week 3): Patient will answer basic yes/no questions utilizing multimodal communication with 75% accuracy with Mod A multimodal cues.  SLP Short Term Goal 6 - Progress (Week 3): Progressing toward goal    New Short Term Goals: Week 4: SLP Short Term Goal 1 (Week 4): Patient will consume current diet without overt s/s of aspiration with supervision verbal cues for use of swallowing compensatory strategies.  SLP Short Term Goal 2 (Week 4): Patient will  demonstrate efficient mastication of Dys. 3 textures with complete oral clearance over 2 consecutive sessions prior to upgrade with Min A verbal cues.  SLP Short Term Goal 3 (Week 4): Patient will answer basic yes/no questions utilizing multimodal communication with 75% accuracy with Mod A multimodal cues.  SLP Short Term Goal 4 (Week 4): Patient will name functional items at the word level with mod  A multimodal cues in 75% of opportunities.  SLP Short Term Goal 5 (Week 4): Pt will complete close-ended sentences with >75% acc mod A.  SLP Short Term Goal 6 (Week 4): Pt will demonstrate reading comprehension at the word level with 75% acc min A.  Weekly Progress Updates:  Pt has demonstrated improved language production with increased effort. With mod A pt is now consistently verbalizing and able to produce target words with approximately 75% acc with mod- max A of semantic and phonemic cueing. Pt participated in a MBSS and was upgraded to a Dys 2 diet with thin liquids. Anticipate pt will continue to make progress with consistent effort/participation.   Intensity: Minumum of 1-2 x/day, 30 to 90 minutes Frequency: 3 to 5 out of 7 days Duration/Length of Stay: 3-4 weeks  Treatment/Interventions: Cueing hierarchy;Functional tasks;Patient/family education;Environmental controls;Internal/external aids;Dysphagia/aspiration precaution training;Therapeutic Activities;Cognitive remediation/compensation;Speech/Language facilitation;Multimodal communication approach   Daily Session  Skilled Therapeutic Interventions: Today, pt managed small amounts of Dys 2 consistency with thin liquids via cup without overt s/s aspiration. Pt was able to choose her address including house #, street and city   from a field of 3 accurately. Simple to moderate y/n questions answered at 11/16 acc with min A. Sentence completion 60% acc. Single word reading comprehension 50% acc. Frequent encouragement required to facilitate  participation.       Function:   Eating Eating   Modified Consistency Diet: Yes Eating Assist Level: Supervision or verbal cues;Set up assist for   Eating Set Up Assist For: Opening containers       Cognition Comprehension Comprehension assist level: Understands basic 25 - 49% of the time/ requires cueing 50 - 75% of the time  Expression   Expression assist level: Expresses basis less than 25% of the time/requires cueing >75% of the time.  Social Interaction Social Interaction assist level: Interacts appropriately 75 - 89% of the time - Needs redirection for appropriate language or to initiate interaction.  Problem Solving Problem solving assist level: Solves basic less than 25% of the time - needs direction nearly all the time or does not effectively solve problems and may need a restraint for safety  Memory Memory assist level: Recognizes or recalls less than 25% of the time/requires cueing greater than 75% of the time   General    Pain Pain Assessment Pain Assessment: No/denies pain  Therapy/Group: Individual Therapy  Vinetta Bergamo MA, CCC-SLP 09/03/2015, 1:37 PM

## 2015-09-03 NOTE — Progress Notes (Signed)
[place] PHYSICAL MEDICINE & REHABILITATION     PROGRESS NOTE    Subjective/Complaints: Had a good night. Arouses easily. In no distress  ROS: remain limited due to cognitive status  Objective: Vital Signs: Blood pressure 157/62, pulse 84, temperature 97.9 F (36.6 C), temperature source Oral, resp. rate 18, weight 64.184 kg (141 lb 8 oz), SpO2 98 %. Dg Swallowing Func-speech Pathology  09/02/2015  . Objective Swallowing Evaluation: Type of Study: MBS-Modified Barium Swallow Study Patient Details Name: [name] MRN: [id number] Date of Birth: 1941-04-07 Today's Date: 09/02/2015 Time: SLP Start Time (ACUTE ONLY): 1257-SLP Stop Time (ACUTE ONLY): 1327 SLP Time Calculation (min) (ACUTE ONLY): 30 min Past Medical History: Past Medical History Diagnosis Date . Bronchitis, not specified as acute or chronic  . Other chronic sinusitis  . Other chronic allergic conjunctivitis  . Allergic rhinitis, cause unspecified  . 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 Acute renal insufficiency    due to amphotericin 2012 . Irritable bowel  . Hypertension  . Diverticulosis  Past Surgical History: Past Surgical History Procedure Laterality Date . Cesarean section     x 4 . Vesicovaginal fistula closure w/ tah   . Breast lumpectomy     Right-benign . Tonsillectomy   . Appendectomy   . Bronchoscopy     Video bronch w/ TBBX 2012 HPI: [name] is an 75 y.o. female with a history of hypertension, brought to the emergency room in code stroke status following acute onset of right-sided weakness and speech difficulty.  She has no previous history of stroke or TIA. CT scan of her head with a large left basal ganglia hemorrhage. Patient admitted to IP Rehab and has been participating in therapies.  Given improved bedside presentation repeat objective assessment warranted.   No Data Recorded Assessment / Plan / Recommendation CHL IP CLINICAL IMPRESSIONS 09/02/2015 Therapy Diagnosis Moderate oral phase  dysphagia;Mild pharyngeal phase dysphagia Clinical Impression Repeat MBS complete with functional improvements noted. Patient continues to demonstrate moderate oral phase deficits impacted by right sided oral weakness as well as some decreased coordination as times. Pharyngeal phase deficits are now mild in nature with slightly delayed swallow initiation resulting in flash penetration of thin liquids via straw. Cues for larges boluses were provided as well as visual cues; however, patient only consumed small boluses across all tested consistencies.  Recommend initiation of Dys.2 textures with thin liquids via cup and continued full supervision during PO intake. SLP will continue to follow for dysphagia therapy with ability to advance solids at bedside.   Impact on safety and function Mild aspiration risk   CHL IP TREATMENT RECOMMENDATION 09/02/2015 Treatment Recommendations Other (Comment)   Prognosis 09/02/2015 Prognosis for Safe Diet Advancement Guarded Barriers to Reach Goals Language deficits Barriers/Prognosis Comment -- CHL IP DIET RECOMMENDATION 09/02/2015 SLP Diet Recommendations Dysphagia 2 (Fine chop) solids;Thin liquid Liquid Administration via Cup;No straw Medication Administration Via alternative means Compensations Minimize environmental distractions;Slow rate;Small sips/bites;Lingual sweep for clearance of pocketing Postural Changes Seated upright at 90 degrees   CHL IP OTHER RECOMMENDATIONS 09/02/2015 Recommended Consults -- Oral Care Recommendations Oral care BID Other Recommendations --   CHL IP FOLLOW UP RECOMMENDATIONS 09/02/2015 Follow up Recommendations 24 hour supervision/assistance;Skilled Nursing facility   [facility] IP FREQUENCY AND DURATION 08/04/2015 Speech Therapy Frequency (ACUTE ONLY) Refer to care plan for details  Treatment Duration       CHL IP ORAL PHASE 09/02/2015 Oral Phase -- Oral - Pudding Teaspoon -- Oral - Pudding  Cup -- Oral - Honey Teaspoon -- Oral - Honey Cup NT Oral - Nectar Teaspoon  -- Oral - Nectar Cup -- Oral - Nectar Straw -- Oral - Thin Teaspoon -- Oral - Thin Cup -- Oral - Thin Straw -- Oral - Puree -- Oral - Mech Soft -- Oral - Regular -- Oral - Multi-Consistency -- Oral - Pill -- Oral Phase - Comment --  CHL IP PHARYNGEAL PHASE 09/02/2015 Pharyngeal Phase Impaired Pharyngeal- Pudding Teaspoon -- Pharyngeal -- Pharyngeal- Pudding Cup -- Pharyngeal -- Pharyngeal- Honey Teaspoon -- Pharyngeal -- Pharyngeal- Honey Cup NT Pharyngeal -- Pharyngeal- Nectar Teaspoon -- Pharyngeal -- Pharyngeal- Nectar Cup [facility] Pharyngeal Material does not enter airway Pharyngeal- Nectar Straw WFL Pharyngeal -- Pharyngeal- Thin Teaspoon -- Pharyngeal -- Pharyngeal- Thin Cup Delayed swallow initiation-vallecula Pharyngeal -- Pharyngeal- Thin Straw Delayed swallow initiation-vallecula;Delayed swallow initiation-pyriform sinuses;Penetration/Aspiration during swallow Pharyngeal Material enters airway, remains ABOVE vocal cords then ejected out Pharyngeal- Puree Delayed swallow initiation-vallecula Pharyngeal -- Pharyngeal- Mechanical Soft Delayed swallow initiation-vallecula Pharyngeal -- Pharyngeal- Regular -- Pharyngeal -- Pharyngeal- Multi-consistency -- Pharyngeal -- Pharyngeal- Pill -- Pharyngeal -- Pharyngeal Comment --  CHL IP CERVICAL ESOPHAGEAL PHASE 09/02/2015 Cervical Esophageal Phase WFL Pudding Teaspoon -- Pudding Cup -- Honey Teaspoon -- Honey Cup -- Nectar Teaspoon -- Nectar Cup -- Nectar Straw -- Thin Teaspoon -- Thin Cup -- Thin Straw -- Puree -- Mechanical Soft -- Regular -- Multi-consistency -- Pill -- Cervical Esophageal Comment -- No flowsheet data found. Gunnar Fusi, M.A., CCC-SLP [phone] [place] 09/02/2015, 2:01 PM               Recent Labs  09/01/15 0806  WBC 10.1  HGB 11.5*  HCT 34.4*  PLT 267    Recent Labs  09/01/15 0806  NA 136  K 4.0  CL 103  GLUCOSE 115*  BUN 20  CREATININE 0.63  CALCIUM 9.2   CBG (last 3)   Recent Labs  09/01/15 0038 09/01/15 0354  09/01/15 0812  GLUCAP 111* 118* 114*    Wt Readings from Last 3 Encounters:  09/03/15 64.184 kg (141 lb 8 oz)  08/05/15 64.5 kg (142 lb 3.2 oz)  06/14/11 64.501 kg (142 lb 3.2 oz)    Physical Exam:  Constitutional: She appears well-developed, well nourished. NAD. HENT: Normocephalic. Atraumatic.  Cardiovascular: Normal rate and regular rhythm. + systolic Murmur Respiratory: Effort normal and breath sounds normal. No respiratory distress. no wheezes or rales GI: Soft. Bowel sounds are normal. gtube site appears clean without drainage  Neurological: non-verbal for me Alert.  Makes good eye contact when awake.  Follows simple commands DTRs 3+ right upper and right lower extremity Motor:  Right upper extremity and right lower extremity:  0/5 Left upper and left lower extremity >/3/5 or more (patient not participating in MMT) Flexor tone RUE---1-2/4, tr to1/4 RLE Skin: Skin is warm and dry.  Psych:  Calm/flat  Assessment/Plan: 1. 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 Physiatrist is providing close team supervision and 24 hour management of active medical problems listed below. Physiatrist and rehab team continue to assess barriers to discharge/monitor patient progress toward functional and medical goals.  Function:  Bathing Bathing position Bathing activity did not occur: N/A Position: Sitting EOB (and bed level)  Bathing parts Body parts bathed by patient: Right arm, Chest, Abdomen, Front perineal area, Right upper leg, Left upper leg Body parts bathed by  helper: Left arm, Buttocks, Right lower leg, Left lower leg  Bathing assist Assist Level:  (Total assist)      Upper Body Dressing/Undressing Upper body dressing Upper body dressing/undressing activity did not occur: N/A What is the patient wearing?: Pull over shirt/dress      Pull over shirt/dress - Perfomed by patient: Thread/unthread left sleeve, Put head through opening, Pull shirt over trunk Pull over shirt/dress - Perfomed by helper: Thread/unthread right sleeve, Pull shirt over trunk        Upper body assist Assist Level: Touching or steadying assistance(Pt > 75%)      Lower Body Dressing/Undressing Lower body dressing Lower body dressing/undressing activity did not occur: N/A What is the patient wearing?: Pants, Ted Hose, Non-skid slipper socks     Pants- Performed by patient: Thread/unthread left pants leg Pants- Performed by helper: Thread/unthread right pants leg, Pull pants up/down, Fasten/unfasten pants Non-skid slipper socks- Performed by patient: Don/doff left sock Non-skid slipper socks- Performed by helper: Don/doff right sock   Socks - Performed by helper: Don/doff right sock, Don/doff left sock   Shoes - Performed by helper: Don/doff right shoe, Don/doff left shoe, Fasten right, Fasten left       TED Hose - Performed by helper: Don/doff right TED hose, Don/doff left TED hose  Lower body assist Assist for lower body dressing: 2 Helpers      Toileting Toileting Toileting activity did not occur: No continent bowel/bladder event Toileting steps completed by patient: Performs perineal hygiene Toileting steps completed by helper: Adjust clothing prior to toileting, Performs perineal hygiene, Adjust clothing after toileting Toileting Assistive Devices: Toilet aid  Toileting assist Assist level: Two helpers   Transfers Chair/bed transfer   Chair/bed transfer method: Stand pivot, Squat pivot Chair/bed transfer assist level: Maximal assist (Pt 25 - 49%/lift and lower) Chair/bed transfer assistive device: Armrests Mechanical lift: Maximove   Locomotion Ambulation Ambulation activity did not occur: Safety/medical concerns   Max distance: 40 Assist level: Maximal assist (Pt 25 - 49%)   Wheelchair   Type: Manual Max wheelchair  distance: 250 Assist Level: Supervision or verbal cues  Cognition Comprehension Comprehension assist level: Understands basic 75 - 89% of the time/ requires cueing 10 - 24% of the time  Expression Expression assist level: Expresses basic 25 - 49% of the time/requires cueing 50 - 75% of the time. Uses single words/gestures.  Social Interaction Social Interaction assist level: Interacts appropriately 75 - 89% of the time - Needs redirection for appropriate language or to initiate interaction.  Problem Solving Problem solving assist level: Solves basic 50 - 74% of the time/requires cueing 25 - 49% of the time  Memory Memory assist level: Recognizes or recalls 50 - 74% of the time/requires cueing 25 - 49% of the time   Medical Problem List and Plan: 1. 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 DVT Prophylaxis/Anticoagulation: Subcutaneous heparin  3. Pain Management: Hydrocodone as needed 4. Dysphagia. Diet advanced to D2/thins after MBS yesterda  -attention/cognitive factors still play a role in intake  -PEG feeds/H20 boluses  -continue megace to help with po appetite---may ultimately need to reduce feeds to better stimulate app, but not now  5. Neuropsych: This patient is not capable of making decisions on her own behalf. 6. Skin/Wound Care: Routine skin checks,     8. Fluids/Electrolytes/Nutrition: following lytes---follow up labs reviewed   -  potassium normal 9. Seizure prophylaxis. Keppra held for somnolence EEG neg, stopped per neuro 10. Leukocytosis. Wbc's down to 10.1  -I personally reviewed the patient's labs today.  11. Hypertension. Cozaar 100 mg daily. Monitor with increased mobility  -continue current regimen 12. Hyperlipidemia. Lipitor 13. Lethargy post stroke: generally improved. Sleep cycle improved  -continue  ritalin at 10mg  bid   14. Low grade temp resolved  - asp precautions.   15. Spasticity: PRAFO/WHO RUE RLE  -continue ROM/modalities/taping  -increased baclofen to 10mg  bid---likley will need further titration 16. Loose stool: now soft/formed  -continue fiber  -continue probiotic     LOS (Days) 23 A FACE TO FACE EVALUATION WAS PERFORMED  SWARTZ,ZACHARY T 09/03/2015 8:51 AM

## 2015-09-03 NOTE — Progress Notes (Signed)
Occupational Therapy Session Note  Patient Details  Name: [name] MRN: [id number] Date of Birth: Jan 27, 1941  Today's Date: 09/03/2015 OT Individual Time: 1000-1100 OT Individual Time Calculation (min): 60 min    Short Term Goals: Week 3:  OT Short Term Goal 1 (Week 3): Pt will participate in self care tasks for 2 mins in order to decrease level of assist with self care. OT Short Term Goal 2 (Week 3): Pt will locate 3 items to the R during self care tasks by pointing to them in order to demonstrate visual scanning with mod cues OT Short Term Goal 3 (Week 3): Pt will sit EOB with supervision for 2 mins while engaged in self care tasks OT Short Term Goal 4 (Week 3): Pt will incorporate hemi dressing techniques (threading RUE/RLE into clothing) with mod verbal cues  Skilled Therapeutic Interventions/Progress Updates:    Pt sitting upright in bed upon arrival.  Pt replied "Yes" when asked if she was ready to get up and take a shower.  Pt engaged in BADL retraining including bathing at shower level and dressing with sit<>stand from w/c at sink.  Pt required max multimodal cues for task initiation and sequencing with bathing at shower level.  Pt initiated dressing tasks when presented with clothing but required max multimodal cues for hemi dressing techniques.  Pt continues to exhibit impulsivity with transitional movements.  Pt requires max A for functional transfers, sit<>stand, and standing balance. Pt remained in w/c, QRB in place, and half lap tray in place, seated at nursing station.   Therapy Documentation Precautions:  Precautions Precautions: Fall Precaution Comments: R hemiplegia, R neglect, nonverbal Restrictions Weight Bearing Restrictions: No  Pain: Pain Assessment Pain Assessment: Faces Faces Pain Scale: No hurt  See Function Navigator for Current Functional Status.   Therapy/Group: Individual Therapy  [name] 09/03/2015, 12:13 PM

## 2015-09-03 NOTE — NC FL2 (Signed)
[place] LEVEL OF CARE SCREENING TOOL     IDENTIFICATION  Patient Name: [name] Birthdate: April 26, 1940 Sex: female Admission Date (Current Location): 08/11/2015  [facility] and [place] Number:  Herbalist and Address:  [facility], [place] [address], [place], [place] [address]      Provider Number: [id number]  Attending Physician Name and Address:  Meredith Staggers, MD  Relative Name and Phone Number:       Current Level of Care: Other (Comment) (Inpatient Rehabilitation Program) Recommended Level of Care: [place] Prior Approval Number:    Date Approved/Denied:   PASRR Number: [id number]  Discharge Plan: SNF    Current Diagnoses: Patient Active Problem List   Diagnosis Date Noted  . Labile blood pressure   . Somnolence   . Left Basal ganglia hemorrhage ([place]) 08/11/2015  . Hypertensive emergency 08/11/2015  . Incontinence 08/11/2015  . Hypokalemia 08/11/2015  . Protein-calorie malnutrition ([place]) 08/11/2015  . Hemiplegia affecting dominant side ([place])   . Aphasia, post-stroke   . Dysphagia, post-stroke   . Dysphasia, post-stroke   . Hemiplegia, post-stroke ([place])   . Cognitive deficit, post-stroke   . Seizure prophylaxis   . Leukocytosis   . Benign essential HTN   . HLD (hyperlipidemia)   . Lethargy   . Altered mental status   . 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 Brain herniation ([place]) 08/05/2015  . ICH (intracerebral hemorrhage) ([place]) 08/01/2015  . Hepatitis 08/12/2010  . Renal failure 08/12/2010  . 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 CONJUNCTIVITIS, ALLERGIC 09/02/2008  . 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 ALLERGIC RHINITIS 09/02/2008  . BRONCHITIS 09/02/2008    Orientation RESPIRATION BLADDER Height & Weight     Self  Normal Continent (Continent wtih TIMED TOILETING;  can be incontinent at night) Weight: 64.184 kg (141 lb 8 oz) Height:     BEHAVIORAL SYMPTOMS/MOOD  NEUROLOGICAL BOWEL NUTRITION STATUS      Continent Feeding tube (peg tube in place but on Dys 2 with thin liquids with full supervision. Peg for additional nutrition supplementation)  AMBULATORY STATUS COMMUNICATION OF NEEDS Skin   Total Care Non-Verbally Skin abrasions, Surgical wounds (Peg site;  right hip abrasions)                       Personal Care Assistance Level of Assistance  Bathing, Feeding, Dressing, Total care Bathing Assistance: Maximum assistance Feeding assistance: Maximum assistance Dressing Assistance: Maximum assistance Total Care Assistance: Maximum assistance   Functional Limitations Info  Speech          SPECIAL CARE FACTORS FREQUENCY  PT (By licensed PT), OT (By licensed OT), Speech therapy     PT Frequency: 5x/wk OT Frequency: 5x/wk     Speech Therapy Frequency: 5x/wk      Contractures      Additional Factors Info  Code Status, Allergies Code Status Info: Full             Current Medications (09/03/2015):  This is the current hospital active medication list Current Facility-Administered Medications  Medication Dose Route Frequency Provider Last Rate Last Dose  . acetaminophen (TYLENOL) tablet 650 mg  650 mg Oral Q4H PRN Lavon Paganini Angiulli, PA-C   650 mg at 08/30/15 [id number]   Or  . acetaminophen (TYLENOL) suppository 650 mg  650 mg Rectal Q4H PRN Lavon Paganini Angiulli, PA-C      . acidophilus (RISAQUAD) capsule 1 capsule  1  capsule Oral BID Lavon Paganini Angiulli, PA-C   1 capsule at 09/03/15 [id number]  . atorvastatin (LIPITOR) tablet 20 mg  20 mg Oral QHS Lavon Paganini Angiulli, PA-C   20 mg at 09/02/15 2009  . baclofen (LIORESAL) 10 mg/mL oral suspension 10 mg  10 mg Per Tube BID Meredith Staggers, MD   10 mg at 09/03/15 0736  . estrogens (conjugated) (PREMARIN) tablet 0.3 mg  0.3 mg Oral QODAY Daniel J Angiulli, PA-C   0.3 mg at 09/02/15 1213  . feeding supplement (ENSURE ENLIVE) (ENSURE ENLIVE) liquid 237 mL  237 mL Oral TID BM Meredith Staggers, MD   237 mL  at 09/03/15 1000  . feeding supplement (JEVITY 1.5 CAL/FIBER) liquid 1,000 mL  1,000 mL Per Tube Continuous Dale [place], RD   Stopped at 09/02/15 [phone]  . feeding supplement (PRO-STAT SUGAR FREE 64) liquid 30 mL  30 mL Per Tube BID Dale [place], RD   30 mL at 09/03/15 0736  . fluticasone (FLONASE) 50 MCG/ACT nasal spray 1 spray  1 spray Each Nare Daily PRN Lavon Paganini Angiulli, PA-C      . free water 250 mL  250 mL Per Tube Q8H Meredith Staggers, MD   250 mL at 09/03/15 0600  . heparin injection 5,000 Units  5,000 Units Subcutaneous Q8H Lavon Paganini Angiulli, PA-C   5,000 Units at 09/03/15 [id number]  . HYDROcodone-acetaminophen (NORCO/VICODIN) 5-325 MG per tablet 1 tablet  1 tablet Oral Q4H PRN Lavon Paganini Angiulli, PA-C   1 tablet at 09/01/15 2357  . latanoprost (XALATAN) 0.005 % ophthalmic solution 1 drop  1 drop Both Eyes QHS Lavon Paganini Angiulli, PA-C   1 drop at 09/02/15 2200  . loratadine (CLARITIN) tablet 10 mg  10 mg Oral Daily Lavon Paganini Angiulli, PA-C   10 mg at 09/03/15 0736  . losartan (COZAAR) tablet 100 mg  100 mg Oral Daily Lavon Paganini Angiulli, PA-C   100 mg at 09/03/15 0736  . megestrol (MEGACE) 400 MG/10ML suspension 400 mg  400 mg Oral BID Meredith Staggers, MD   400 mg at 09/03/15 0735  . methylphenidate (RITALIN) tablet 10 mg  10 mg Per NG tube BID WC Meredith Staggers, MD   10 mg at 09/03/15 1205  . olopatadine (PATANOL) 0.1 % ophthalmic solution 1 drop  1 drop Both Eyes BID PRN Lavon Paganini Angiulli, PA-C      . ondansetron [facility]) tablet 4 mg  4 mg Oral Q6H PRN Lavon Paganini Angiulli, PA-C   4 mg at 09/01/15 2306   Or  . ondansetron (ZOFRAN) injection 4 mg  4 mg Intravenous Q6H PRN Lavon Paganini Angiulli, PA-C      . pantoprazole sodium (PROTONIX) 40 mg/20 mL oral suspension 40 mg  40 mg Per Tube QHS Meredith Staggers, MD   40 mg at 09/02/15 2008  . polyvinyl alcohol (LIQUIFILM TEARS) 1.4 % ophthalmic solution 1 drop  1 drop Both Eyes PRN Daniel J Angiulli, PA-C      . psyllium (HYDROCIL/METAMUCIL)  packet 1 packet  1 packet Per Tube Daily Meredith Staggers, MD   1 packet at 09/03/15 [id number]  . RESOURCE THICKENUP CLEAR   Oral PRN Lavon Paganini Angiulli, PA-C      . sorbitol 70 % solution 30 mL  30 mL Oral Daily PRN Lavon Paganini Angiulli, PA-C   30 mL at 09/02/15 2012     Discharge Medications: Please see discharge summary for a list  of discharge medications.  Relevant Imaging Results:  Relevant Lab Results:   Additional Information SS# [id number]  Lennart Pall, LCSW

## 2015-09-03 NOTE — Patient Care Conference (Signed)
Inpatient RehabilitationTeam Conference and Plan of Care Update Date: 09/02/2015   Time: 2:40 PM    Patient Name: [name]      Medical Record Number: [id number]  Date of Birth: 1940/05/28 Sex: Female         Room/Bed: 4W14C/4W14C-01 Payor Info: Payor: MEDICARE / Plan: MEDICARE PART A AND B / Product Type: *No Product type* /    Admitting Diagnosis: lt bg hemm  Admit Date/Time:  08/11/2015  2:47 PM Admission Comments: No comment available   Primary Diagnosis:  Basal ganglia hemorrhage (HCC) Principal Problem: Basal ganglia hemorrhage [name])  Patient Active Problem List   Diagnosis Date Noted  . Labile blood pressure   . Somnolence   . 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 Hypertensive emergency 08/11/2015  . Incontinence 08/11/2015  . Hypokalemia 08/11/2015  . Protein-calorie malnutrition ([place]) 08/11/2015  . Hemiplegia affecting dominant side ([place])   . Aphasia, post-stroke   . Dysphagia, post-stroke   . Dysphasia, post-stroke   . 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 Cognitive deficit, post-stroke   . Seizure prophylaxis   . Leukocytosis   . Benign essential HTN   . HLD (hyperlipidemia)   . Lethargy   . Altered mental status   . Cytotoxic brain edema ([place]) 08/05/2015  . Brain herniation ([place]) 08/05/2015  . ICH (intracerebral hemorrhage) ([place]) 08/01/2015  . Hepatitis 08/12/2010  . Renal failure 08/12/2010  . 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 CONJUNCTIVITIS, ALLERGIC 09/02/2008  . 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 ALLERGIC RHINITIS 09/02/2008  . BRONCHITIS 09/02/2008    Expected Discharge Date: Expected Discharge Date:  (SNF)  Team Members Present: Physician leading conference: Dr. Alger Simons Social Worker Present: Lennart Pall, LCSW Nurse Present: Rayetta Pigg, RN PT Present: [name], PT OT Present: Roanna Epley, [place], OT SLP Present: Weston Anna, SLP PPS Coordinator present : Daiva Nakayama, RN, CRRN   Current Status/Progress Goal Weekly Team Focus  Medical   feeding tube placed and tolerated. more alert. spasticity issues  improve tone, increase engagement  tone mgt, nuttion, dysphagia rx   Bowel/Bladder   Continent of bladder with timed toileting, can be incontinent at HS, continent of bowel LBM 5/14  min assist  Timed toilet    Swallow/Nutrition/ Hydration   Dys.2 textures and thin liquids with full supervision fo safety; PEG for meds and supplemtation as needed  Min A with least restrictive diet  tolerance of current diet    ADL's   dressing-mod A UB, max A LB; mod A/max A functional transfers; max verbal cues for task initiation, decreased R attention  overall mod A  activity tolerance, task initiation, following one step commands, sitting balance, functional transfers, attentin to right   Mobility   min-mod A bed mobility, max A transfers, max A gait with +2 WC follow for safety, max A stairs  min-mod A wheelchair level  transfers, ambulation, R NMR, standing balance, safety awareness, R attention, wheelchair propulsion, pt/familyeducation   Communication   Mod A  Mod A  multimodal communication, verbal expression of wants/needs, yes/no accuracy   Safety/Cognition/ Behavioral Observations  Max A  mod A  attention, problem solving, orientation   Pain   Headache or abdominal pain. PRN Vicodin  <3  Assess and treat pain q shift and prn   Skin   Right hip abrasion healed-allevyn in place, brusing to abd from heparin shots  min assist  Assess q shift      *See Care Plan and  progress notes for long and short-term goals.  Barriers to Discharge: ongoing cognitive/language deficits, spasticity RUE    Possible Resolutions to Barriers:  see prior, ?AFO RLE, continued spasticity rx    Discharge Planning/Teaching Needs:  DC plan is SNF      Team Discussion:  Still addressing bowels;  Spasticity in right arm continues.  Anticipate need for AFO.  Still max assist for any ambulation.   Slow progress but team feel could transition to SNF beginning next week.   Revisions to Treatment Plan:  NA   Continued Need for Acute Rehabilitation Level of Care: The patient requires daily medical management by a physician with specialized training in physical medicine and rehabilitation for the following conditions: Daily direction of a multidisciplinary physical rehabilitation program to ensure safe treatment while eliciting the highest outcome that is of practical value to the patient.: Yes Daily medical management of patient stability for increased activity during participation in an intensive rehabilitation regime.: Yes Daily analysis of laboratory values and/or radiology reports with any subsequent need for medication adjustment of medical intervention for : Neurological problems;Other;Nutritional problems  [name] 09/03/2015, 12:51 PM

## 2015-09-03 NOTE — Progress Notes (Signed)
Social Work Patient ID: [name], female   DOB: [birth year], 75 y.o.   MRN: [id number]   Robin Curb, LCSW Social Worker Signed  Patient Care Conference 09/03/2015 12:50 PM    Expand All Collapse All   Inpatient RehabilitationTeam Conference and Plan of Care Update Date: 09/02/2015   Time: 2:40 PM     Patient Name: [name]       Medical Record Number: [id number]  Date of Birth: 1940/09/07 Sex: Female         Room/Bed: 4W14C/4W14C-01 Payor Info: Payor: MEDICARE / Plan: MEDICARE PART A AND B / Product Type: *No Product type* /    Admitting Diagnosis: lt bg hemm   Admit Date/Time:  08/11/2015  2:47 PM Admission Comments: No comment available   Primary Diagnosis:  Basal ganglia hemorrhage (HCC) Principal Problem: Basal ganglia hemorrhage [facility])    Patient Active Problem List     Diagnosis  Date Noted   .  Labile blood pressure     .  Somnolence     .  Left Basal ganglia hemorrhage ([place])  08/11/2015   .  Hypertensive emergency  08/11/2015   .  Incontinence  08/11/2015   .  Hypokalemia  08/11/2015   .  Protein-calorie malnutrition ([place])  08/11/2015   .  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  Aphasia, post-stroke     .  Dysphagia, post-stroke     .  Dysphasia, post-stroke     .  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  Cognitive deficit, post-stroke     .  Seizure prophylaxis     .  Leukocytosis     .  Benign essential HTN     .  HLD (hyperlipidemia)     .  Lethargy     .  Altered mental status     .  Cytotoxic brain edema ([place])  08/05/2015   .  Brain herniation ([place])  08/05/2015   .  ICH (intracerebral hemorrhage) ([place])  08/01/2015   .  Hepatitis  08/12/2010   .  Renal failure  08/12/2010   .  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  CONJUNCTIVITIS, ALLERGIC  09/02/2008   .  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  ALLERGIC RHINITIS  09/02/2008   .  BRONCHITIS  09/02/2008     Expected Discharge Date: Expected Discharge Date:  (SNF)  Team Members  Present: Physician leading conference: Dr. Alger Chase Social Worker Present: Robin Pall, LCSW Nurse Present: Robin Pigg, RN PT Present: [name], PT OT Present: [name], [place], OT SLP Present: [name], SLP PPS Coordinator present : Robin Nakayama, RN, CRRN        Current Status/Progress  Goal  Weekly Team Focus   Medical     feeding tube placed and tolerated. more alert. spasticity issues  improve tone, increase engagement  tone mgt, nuttion, dysphagia rx   Bowel/Bladder     Continent of bladder with timed toileting, can be incontinent at HS, continent of bowel LBM 5/14  min assist  Timed toilet    Swallow/Nutrition/ Hydration     Dys.2 textures and thin liquids with full supervision fo safety; PEG for meds and supplemtation as needed  Min A with least restrictive diet  tolerance of current diet    ADL's     dressing-mod A UB, max A LB; mod A/max A functional transfers; max verbal cues for task initiation,  decreased R attention   overall mod A  activity tolerance, task initiation, following one step commands, sitting balance, functional transfers, attentin to right    Mobility     min-mod A bed mobility, max A transfers, max A gait with +2 WC follow for safety, max A stairs  min-mod A wheelchair level  transfers, ambulation, R NMR, standing balance, safety awareness, R attention, wheelchair propulsion, pt/familyeducation   Communication     Mod A  Mod A  multimodal communication, verbal expression of wants/needs, yes/no accuracy   Safety/Cognition/ Behavioral Observations    Max A  mod A  attention, problem solving, orientation    Pain     Headache or abdominal pain. PRN Vicodin   <3  Assess and treat pain q shift and prn    Skin     Right hip abrasion healed-allevyn in place, brusing to abd from heparin shots  min assist  Assess q shift      *See Care Plan and progress notes for long and short-term goals.    Barriers to Discharge:  ongoing  cognitive/language deficits, spasticity RUE     Possible Resolutions to Barriers:   see prior, ?AFO RLE, continued spasticity rx     Discharge Planning/Teaching Needs:   DC plan is SNF       Team Discussion:    Still addressing bowels;  Spasticity in right arm continues.  Anticipate need for AFO.  Still max assist for any ambulation.  Slow progress but team feel could transition to SNF beginning next week.    Revisions to Treatment Plan:    NA    Continued Need for Acute Rehabilitation Level of Care: The patient requires daily medical management by a physician with specialized training in physical medicine and rehabilitation for the following conditions: Daily direction of a multidisciplinary physical rehabilitation program to ensure safe treatment while eliciting the highest outcome that is of practical value to the patient.: Yes Daily medical management of patient stability for increased activity during participation in an intensive rehabilitation regime.: Yes Daily analysis of laboratory values and/or radiology reports with any subsequent need for medication adjustment of medical intervention for : Neurological problems;Other;Nutritional problems  [name] 09/03/2015, 12:51 PM                 Robin Curb, LCSW Social Worker Signed  Patient Care Conference 08/26/2015  3:33 PM    Expand All Collapse All   Inpatient RehabilitationTeam Conference and Plan of Care Update Date: 08/26/2015   Time: 2:35 PM     Patient Name: [name]       Medical Record Number: [id number]  Date of Birth: [birth year] Sex: Female         Room/Bed: 4W14C/4W14C-01 Payor Info: Payor: MEDICARE / Plan: MEDICARE PART A AND B / Product Type: *No Product type* /    Admitting Diagnosis: lt bg hemm   Admit Date/Time:  08/11/2015  2:47 PM Admission Comments: No comment available   Primary Diagnosis:  <principal problem not specified> Principal Problem: <principal problem not specified>    Patient  Active Problem List     Diagnosis  Date Noted   .  Labile blood pressure     .  Somnolence     .  Left Basal ganglia hemorrhage ([place])  08/11/2015   .  Hypertensive emergency  08/11/2015   .  Incontinence  08/11/2015   .  Hypokalemia  08/11/2015   .  Protein-calorie malnutrition ([place])  08/11/2015   .  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  Aphasia, post-stroke     .  Dysphagia, post-stroke     .  Dysphasia, post-stroke     .  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  Cognitive deficit, post-stroke     .  Seizure prophylaxis     .  Leukocytosis     .  Benign essential HTN     .  HLD (hyperlipidemia)     .  Lethargy     .  Altered mental status     .  Cytotoxic brain edema ([place])  08/05/2015   .  Brain herniation ([place])  08/05/2015   .  ICH (intracerebral hemorrhage) ([place])  08/01/2015   .  Hepatitis  08/12/2010   .  Renal failure  08/12/2010   .  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  CONJUNCTIVITIS, ALLERGIC  09/02/2008   .  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  ALLERGIC RHINITIS  09/02/2008   .  BRONCHITIS  09/02/2008     Expected Discharge Date: Expected Discharge Date:  (SNF)  Team Members Present: Physician leading conference: Dr. Alger Chase Social Worker Present: Robin Pall, LCSW Nurse Present: Robin Pigg, RN PT Present: [name], PT OT Present: [name], Verdie Mosher, OT SLP Present: [name], SLP PPS Coordinator present : Robin Nakayama, RN, CRRN        Current Status/Progress  Goal  Weekly Team Focus   Medical     arousal better but still waxes and wanes. not taking in enough to sustain---peg placed yesterday  improve po intake, consistent arousal   see prior, titrate PEG feeds, dc ivf    Bowel/Bladder     Continent of bladder at times; LBM 5/2- not eating well  Min assist  Timed toileting; assess and treat for constipation as needed    Swallow/Nutrition/ Hydration     Dys. 1 textures with honey-thick liquids, Full  supervision, PEG placement   Min A with least restrictive diet  tolerance with current diet, trials of upgraded textures and liquids    ADL's     mod/max A overall; fluctuating arousal, mod/max verbal cues for task initiation  overall mod A  activity tolerance, task initiation, following one step commands, sitting balance, functional transfers, attention to right,    Mobility     min-mod A bed mobility, mod-max A transfers, max A gait and stairs wtih +2 for safety/WC follow  mod A wheelchair level  functional transfers, ambulation, R NMR, sitting > standing balance, command following, wheelchair seating/propulsion, safety, pt/family education   Communication     Max-Total A  Mod A  multimodal communication, naming, yes/no accuracy    Safety/Cognition/ Behavioral Observations    Max A   Min-Mod A  attention, problem solving, attention to right field of enviornment    Pain     C/o pain in abdomen from PEG placement; tylenol given   < 3  Assess and treat for pain q shift and prn   Skin     Right hip abrasion healed; bruising to abdomen from heparin shots; incision to abdomen from PEG insertion  Min assist  Assess skin q shift and prn    Rehab Goals Patient on target to meet rehab goals: Yes *See Care Plan and progress notes for long and short-term goals.    Barriers to Discharge:  inconsistent arousal and po intake  Possible Resolutions to Barriers:   peg placed, continue to encourage normal sleep-wake patter, engage daily     Discharge Planning/Teaching Needs:   D/c plan is for pt to go to SNF either at [facility] or another facility.       Team Discussion:    Peg placed this week.  Eating minimally.  Some improved alertness overall.  Still impulsive.  Is continent when awake - starting timed toileting. Currently mod/ max assist with hemi walker.  D/c plan for SNF.   Revisions to Treatment Plan:    None    Continued Need for Acute Rehabilitation Level of Care: The patient  requires daily medical management by a physician with specialized training in physical medicine and rehabilitation for the following conditions: Daily direction of a multidisciplinary physical rehabilitation program to ensure safe treatment while eliciting the highest outcome that is of practical value to the patient.: Yes Daily medical management of patient stability for increased activity during participation in an intensive rehabilitation regime.: Yes Daily analysis of laboratory values and/or radiology reports with any subsequent need for medication adjustment of medical intervention for : Mood/behavior problems;Neurological problems;Nutritional problems  Max Nuno 08/26/2015, 3:33 PM                 Robin Curb, LCSW Social Worker Signed  Patient Care Conference 08/20/2015 10:13 AM    Expand All Collapse All   Inpatient RehabilitationTeam Conference and Plan of Care Update Date: 08/19/2015   Time: 2:30 PM     Patient Name: [name]       Medical Record Number: [id number]  Date of Birth: [birth year] Sex: Female         Room/Bed: 4W16C/4W16C-01 Payor Info: Payor: MEDICARE / Plan: MEDICARE PART A AND B / Product Type: *No Product type* /    Admitting Diagnosis: lt bg hemm   Admit Date/Time:  08/11/2015  2:47 PM Admission Comments: No comment available   Primary Diagnosis:  <principal problem not specified> Principal Problem: <principal problem not specified>    Patient Active Problem List     Diagnosis  Date Noted   .  Somnolence     .  Left Basal ganglia hemorrhage ([place])  08/11/2015   .  Hypertensive emergency  08/11/2015   .  Incontinence  08/11/2015   .  Hypokalemia  08/11/2015   .  Protein-calorie malnutrition ([place])  08/11/2015   .  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  Aphasia, post-stroke     .  Dysphagia, post-stroke     .  Dysphasia, post-stroke     .  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  Cognitive deficit, post-stroke     .  Seizure prophylaxis      .  Leukocytosis     .  Benign essential HTN     .  HLD (hyperlipidemia)     .  Lethargy     .  Altered mental status     .  Cytotoxic brain edema ([place])  08/05/2015   .  Brain herniation ([place])  08/05/2015   .  ICH (intracerebral hemorrhage) ([place])  08/01/2015   .  Hepatitis  08/12/2010   .  Renal failure  08/12/2010   .  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  CONJUNCTIVITIS, ALLERGIC  09/02/2008   .  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  ALLERGIC RHINITIS  09/02/2008   .  BRONCHITIS  09/02/2008  Expected Discharge Date: Expected Discharge Date: 09/09/15  Team Members Present: Physician leading conference: Dr. Alger Chase Social Worker Present: Robin Pall, LCSW Nurse Present: Other (comment) Genene Churn, RN) PT Present: [name], PT OT Present: [name], [place], OT SLP Present: [name], SLP PPS Coordinator present : Robin Nakayama, RN, CRRN        Current Status/Progress  Goal  Weekly Team Focus   Medical     waxingn and waning cognition, NGT TF's---, diarhea---changing formula. ritalin trial  improve arousal and paricipaion  nutrition,  stool consistency, arousal, skin   Bowel/Bladder     incontinent x2 LBM:08/18/2015. loose due to TF   continent   timed toileting, monitor for regular BM    Swallow/Nutrition/ Hydration     Dys. 1 textures with honey-thick liquids, NG tube and IV for support, limited PO intake due to arousal  Min A with least restrictive diet  tolerance of current diet    ADL's     max/tot A overall; decreased arousal   overall mod A  increased arojusal, task initiation, following one step commands, sitting balance, bed mobility   Mobility     max-total A x 2  mod A wheelchair level  arousal, command following, functional transfers, sitting balance, activity tolerance, positioning, family education    Communication     Nonverbal, Total A   Mod A  arousal, multimodal communication, yes/no accuracy, object  identification   Safety/Cognition/ Behavioral Observations    Total A   Min-Mod A  arousal, following commands, attention, attention to midline/right field of enviornment    Pain     no signs of pain   remain pain free   utilize faces pain scale.    Skin     MASD, Right Hip Abrasion and generalized bruising   remain free from skin breadown or infection   turn and reposition q2     Rehab Goals Rehab Goals Revised: minimal changes since last week. *See Care Plan and progress notes for long and short-term goals.    Barriers to Discharge:  see prior     Possible Resolutions to Barriers:   incrase stimulant, continue to engage, normalize day/night      Discharge Planning/Teaching Needs:   Most likely plan is for pt to d/c to a SNF either at [facility] or another facility affiliated with [place] [facility].       Team Discussion:    Increased ritalin today but pt continues with poor arousal majority of the day.  Slightly better this afternoon with txs.  Very inconsistent presentation and MD feels this is to be expected.  Team discussing d/c care needs and concern over what husband's expectations are for longer term recovery.  Education ongoing with spouse and family.   Revisions to Treatment Plan:    None    Continued Need for Acute Rehabilitation Level of Care: The patient requires daily medical management by a physician with specialized training in physical medicine and rehabilitation for the following conditions: Daily direction of a multidisciplinary physical rehabilitation program to ensure safe treatment while eliciting the highest outcome that is of practical value to the patient.: Yes Daily medical management of patient stability for increased activity during participation in an intensive rehabilitation regime.: Yes Daily analysis of laboratory values and/or radiology reports with any subsequent need for medication adjustment of medical intervention for : Neurological problems;Nutritional  problems;Urological problems;Mood/behavior problems  Adron Geisel 08/20/2015, 10:13 AM

## 2015-09-03 NOTE — Progress Notes (Signed)
Physical Therapy Weekly Progress Note  Patient Details  Name: [name] MRN: [id number] Date of Birth: 12-28-1940  Beginning of progress report period: Aug 28, 2015 End of progress report period: Sep 03, 2015  Today's Date: 09/03/2015 PT Individual Time: 0800-0820 PT Individual Time Calculation (min): 20 min   Patient has met 2 of 11 long term goals. Patient has made steady progress this reporting period. She demonstrates minimally improved attention to R side of body and requires less cuing for obstacle negotiation on R during wheelchair propulsion in controlled environment. During functional ambulation using hemiwalker, patient is able to initiate the advancement of RLE but requires assistance for safe placement due to impaired proprioception/apraxia. Patient continues to demonstrate decreased safety awareness with functional mobility. Patient and family education ongoing.    Patient continues to demonstrate the following deficits: R hemiplegia, muscle weakness, decreased endurance, impaired timing and sequencing, abnormal tone, unbalanced muscle activation, apraxia, decreased coordination, decreased midline orientation, decreased attention to right, decreased attention, decreased awareness, decreased problem solving, decreased safety awareness, decreased memory, delayed processing, decreased sitting balance, decreased standing balance, decreased postural control,and decreased balance strategies and therefore will continue to benefit from skilled PT intervention to enhance overall performance with activity tolerance, balance, postural control, ability to compensate for deficits, functional use of  right upper extremity and right lower extremity, attention, awareness and coordination.  See Patient's Care Plan for progression toward long term goals.  Patient progressing toward long term goals.  Continue plan of care.  Skilled Therapeutic Interventions/Progress Updates:   Patient with eyes  closed in bed upon therapist arrival. Patient with no attempt to make eye contact or respond to therapist with conversation. Patient transferred supine > sit with max A as patient did not attempt to assist with elevating trunk by pushing up using LUE. Seated EOB patient required max A for static sitting balance with forward flexed head and posture. When therapist decreased assistance, patient with LOB to R with no attempt to catch self and then purposefully lying down to L to in effort to return to bed. RN present and reported that patient was alert and awake and engaged in conversation and took medications prior to PT arrival. Patient's husband arrived and also attempted to engage patient with no response. Patient provided with washcloth but required [facility] assist to wash face. Due to lethargy/refusal to actively participate in therapy, patient returned to bed with max A and left in L sidelying with needs in reach and bed alarm on, husband present. Will f/u as able per POC.    Therapy Documentation Precautions:  Precautions Precautions: Fall Precaution Comments: R hemiplegia, R neglect, nonverbal Restrictions Weight Bearing Restrictions: No General: PT Amount of Missed Time (min): 40 Minutes PT Missed Treatment Reason: Patient unwilling to participate;Patient fatigue Pain: Pain Assessment Pain Assessment: Faces Faces Pain Scale: No hurt  See Function Navigator for Current Functional Status.  Therapy/Group: Individual Therapy  [name] 09/03/2015, 8:26 AM

## 2015-09-04 ENCOUNTER — Inpatient Hospital Stay (HOSPITAL_COMMUNITY): Payer: Medicare Other

## 2015-09-04 ENCOUNTER — Inpatient Hospital Stay (HOSPITAL_COMMUNITY): Payer: Medicare Other | Admitting: Speech Pathology

## 2015-09-04 ENCOUNTER — Inpatient Hospital Stay (HOSPITAL_COMMUNITY): Payer: Medicare Other | Admitting: Physical Therapy

## 2015-09-04 MED ORDER — SENNOSIDES-DOCUSATE SODIUM 8.6-50 MG PO TABS
1.0000 | ORAL_TABLET | Freq: Two times a day (BID) | ORAL | Status: DC
  Administered 2015-09-04 – 2015-09-12 (×17): 1
  Filled 2015-09-04 (×17): qty 1

## 2015-09-04 MED ORDER — BACLOFEN 1 MG/ML ORAL SUSPENSION
10.0000 mg | Freq: Three times a day (TID) | ORAL | Status: DC
  Administered 2015-09-04 – 2015-09-12 (×24): 10 mg
  Filled 2015-09-04 (×25): qty 1

## 2015-09-04 NOTE — Progress Notes (Signed)
Social Work Patient ID: [name], female   DOB: 09/27/1940, 75 y.o.   MRN: [id number]   Met with pt's husband this morning to review team conf and discuss timing of SNF placement.  He is in agreement with team recommendation that we aim for SNF transition next week.  I have spoken with [facility] admissions and they will contact spouse as well.  They do think they will have a bed available next week for pt.   This afternoon I met with pt and had Weston Anna, ST with me.  RN had informed me earlier that pt seemed to be questioning about her d/c plan.  Difficult to be sure about what pt was communicating but she does seem to endorse that she was wondering what her d/c plan is.  I discussed that plans for SNF transition and she seemed to be in agreement with "yes" and head nods.  She also endorsed feeling depressed about her situation.  Provided support and encouragement.  Will continue to follow for d/c planning and support needs.  Lecil Tapp, LCSW

## 2015-09-04 NOTE — Progress Notes (Signed)
[facility]ICINE & REHABILITATION     PROGRESS NOTE    Subjective/Complaints: Up in bed. Alert. A little restless.  ROS: remain limited due to cognitive status  Objective: Vital Signs: Blood pressure 125/63, pulse 70, temperature 97.7 F (36.5 C), temperature source Oral, resp. rate 18, weight 64.547 kg (142 lb 4.8 oz), SpO2 98 %. Dg Swallowing Func-speech Pathology  09/02/2015  . Objective Swallowing Evaluation: Type of Study: MBS-Modified Barium Swallow Study Patient Details Name: [name] MRN: [id number] Date of Birth: [birth year] Today's Date: 09/02/2015 Time: SLP Start Time (ACUTE ONLY): 1257-SLP Stop Time (ACUTE ONLY): 1327 SLP Time Calculation (min) (ACUTE ONLY): 30 min Past Medical History: Past Medical History Diagnosis Date . Bronchitis, not specified as acute or chronic  . Other chronic sinusitis  . Other chronic allergic conjunctivitis  . Allergic rhinitis, cause unspecified  . 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 Acute renal insufficiency    due to amphotericin 2012 . Irritable bowel  . Hypertension  . Diverticulosis  Past Surgical History: Past Surgical History Procedure Laterality Date . Cesarean section     x 4 . Vesicovaginal fistula closure w/ tah   . Breast lumpectomy     Right-benign . Tonsillectomy   . Appendectomy   . Bronchoscopy     Video bronch w/ TBBX 2012 HPI: [name] is an 75 y.o. female with a history of hypertension, brought to the emergency room in code stroke status following acute onset of right-sided weakness and speech difficulty.  She has no previous history of stroke or TIA. CT scan of her head with a large left basal ganglia hemorrhage. Patient admitted to IP Rehab and has been participating in therapies.  Given improved bedside presentation repeat objective assessment warranted.   No Data Recorded Assessment / Plan / Recommendation [facility] IP CLINICAL IMPRESSIONS 09/02/2015 Therapy Diagnosis Moderate oral phase dysphagia;Mild  pharyngeal phase dysphagia Clinical Impression Repeat MBS complete with functional improvements noted. Patient continues to demonstrate moderate oral phase deficits impacted by right sided oral weakness as well as some decreased coordination as times. Pharyngeal phase deficits are now mild in nature with slightly delayed swallow initiation resulting in flash penetration of thin liquids via straw. Cues for larges boluses were provided as well as visual cues; however, patient only consumed small boluses across all tested consistencies.  Recommend initiation of Dys.2 textures with thin liquids via cup and continued full supervision during PO intake. SLP will continue to follow for dysphagia therapy with ability to advance solids at bedside.   Impact on safety and function Mild aspiration risk   [facility] IP TREATMENT RECOMMENDATION 09/02/2015 Treatment Recommendations Other (Comment)   Prognosis 09/02/2015 Prognosis for Safe Diet Advancement Guarded Barriers to Reach Goals Language deficits Barriers/Prognosis Comment -- [facility] IP DIET RECOMMENDATION 09/02/2015 SLP Diet Recommendations Dysphagia 2 (Fine chop) solids;Thin liquid Liquid Administration via Cup;No straw Medication Administration Via alternative means Compensations Minimize environmental distractions;Slow rate;Small sips/bites;Lingual sweep for clearance of pocketing Postural Changes Seated upright at 90 degrees   [facility] IP OTHER RECOMMENDATIONS 09/02/2015 Recommended Consults -- Oral Care Recommendations Oral care BID Other Recommendations --   [facility] IP FOLLOW UP RECOMMENDATIONS 09/02/2015 Follow up Recommendations 24 hour supervision/assistance;Skilled Nursing facility   [facility] IP FREQUENCY AND DURATION 08/04/2015 Speech Therapy Frequency (ACUTE ONLY) Refer to care plan for details  Treatment Duration       [facility] IP ORAL PHASE 09/02/2015 Oral Phase -- Oral - Pudding Teaspoon -- Oral - Pudding Cup --  Oral - Honey Teaspoon -- Oral - Honey Cup NT Oral - Nectar Teaspoon -- Oral -  Nectar Cup -- Oral - Nectar Straw -- Oral - Thin Teaspoon -- Oral - Thin Cup -- Oral - Thin Straw -- Oral - Puree -- Oral - Mech Soft -- Oral - Regular -- Oral - Multi-Consistency -- Oral - Pill -- Oral Phase - Comment --  [facility] IP PHARYNGEAL PHASE 09/02/2015 Pharyngeal Phase Impaired Pharyngeal- Pudding Teaspoon -- Pharyngeal -- Pharyngeal- Pudding Cup -- Pharyngeal -- Pharyngeal- Honey Teaspoon -- Pharyngeal -- Pharyngeal- Honey Cup NT Pharyngeal -- Pharyngeal- Nectar Teaspoon -- Pharyngeal -- Pharyngeal- Nectar Cup [facility] Pharyngeal Material does not enter airway Pharyngeal- Nectar Straw WFL Pharyngeal -- Pharyngeal- Thin Teaspoon -- Pharyngeal -- Pharyngeal- Thin Cup Delayed swallow initiation-vallecula Pharyngeal -- Pharyngeal- Thin Straw Delayed swallow initiation-vallecula;Delayed swallow initiation-pyriform sinuses;Penetration/Aspiration during swallow Pharyngeal Material enters airway, remains ABOVE vocal cords then ejected out Pharyngeal- Puree Delayed swallow initiation-vallecula Pharyngeal -- Pharyngeal- Mechanical Soft Delayed swallow initiation-vallecula Pharyngeal -- Pharyngeal- Regular -- Pharyngeal -- Pharyngeal- Multi-consistency -- Pharyngeal -- Pharyngeal- Pill -- Pharyngeal -- Pharyngeal Comment --  [facility] IP CERVICAL ESOPHAGEAL PHASE 09/02/2015 Cervical Esophageal Phase WFL Pudding Teaspoon -- Pudding Cup -- Honey Teaspoon -- Honey Cup -- Nectar Teaspoon -- Nectar Cup -- Nectar Straw -- Thin Teaspoon -- Thin Cup -- Thin Straw -- Puree -- Mechanical Soft -- Regular -- Multi-consistency -- Pill -- Cervical Esophageal Comment -- No flowsheet data found. [name]., CCC-SLP [phone] [place] 09/02/2015, 2:01 PM              No results for input(s): WBC, HGB, HCT, PLT in the last 72 hours. No results for input(s): NA, K, CL, GLUCOSE, BUN, CREATININE, CALCIUM in the last 72 hours.  Invalid input(s): CO CBG (last 3)  No results for input(s): GLUCAP in the last 72 hours.  Wt Readings from  Last 3 Encounters:  09/04/15 64.547 kg (142 lb 4.8 oz)  08/05/15 64.5 kg (142 lb 3.2 oz)  06/14/11 64.501 kg (142 lb 3.2 oz)    Physical Exam:  Constitutional: She appears well-developed, well nourished. NAD. HENT: Normocephalic. Atraumatic.  Cardiovascular: Normal rate and regular rhythm. + systolic Murmur Respiratory: Effort normal and breath sounds normal. No respiratory distress. no wheezes or rales GI: Soft. Bowel sounds are normal. gtube site appears clean without drainage  Neurological: non-verbal for me Alert.  Makes good eye contact when awake.  Follows simple commands DTRs 3+ right upper and right lower extremity   Motor:  Right upper extremity and right lower extremity:  0/5 Left upper and left lower extremity >/3/5 or more (patient not participating in MMT) Flexor tone RUE---1-2/4, tr to1/4 RLE Skin: Skin is warm and dry.  Psych:  Calm/flat  Assessment/Plan: 1. 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 Physiatrist is providing close team supervision and 24 hour management of active medical problems listed below. Physiatrist and rehab team continue to assess barriers to discharge/monitor patient progress toward functional and medical goals.  Function:  Bathing Bathing position Bathing activity did not occur: N/A Position: Shower  Bathing parts Body parts bathed by patient: Right arm, Chest, Abdomen, Front perineal area, Right upper leg, Left upper leg Body parts bathed by helper: Left arm, Buttocks, Right lower leg, Left lower leg  Bathing assist Assist Level:  (Total assist)      Upper Body Dressing/Undressing Upper  body dressing Upper body dressing/undressing activity did not occur: N/A What is the patient wearing?: Pull over shirt/dress     Pull over shirt/dress - Perfomed by patient: Thread/unthread left sleeve, Put  head through opening, Pull shirt over trunk Pull over shirt/dress - Perfomed by helper: Thread/unthread right sleeve, Pull shirt over trunk        Upper body assist Assist Level: Touching or steadying assistance(Pt > 75%)      Lower Body Dressing/Undressing Lower body dressing Lower body dressing/undressing activity did not occur: N/A What is the patient wearing?: Pants, Ted Hose, Non-skid slipper socks     Pants- Performed by patient: Thread/unthread left pants leg Pants- Performed by helper: Thread/unthread right pants leg, Pull pants up/down, Fasten/unfasten pants Non-skid slipper socks- Performed by patient: Don/doff left sock Non-skid slipper socks- Performed by helper: Don/doff right sock   Socks - Performed by helper: Don/doff right sock, Don/doff left sock   Shoes - Performed by helper: Don/doff right shoe, Don/doff left shoe, Fasten right, Fasten left       TED Hose - Performed by helper: Don/doff right TED hose, Don/doff left TED hose  Lower body assist Assist for lower body dressing: 2 Helpers      Toileting Toileting Toileting activity did not occur: No continent bowel/bladder event Toileting steps completed by patient: Performs perineal hygiene Toileting steps completed by helper: Adjust clothing prior to toileting, Performs perineal hygiene, Adjust clothing after toileting Toileting Assistive Devices: Toilet aid  Toileting assist Assist level: Two helpers   Transfers Chair/bed transfer   Chair/bed transfer method: Stand pivot, Squat pivot Chair/bed transfer assist level: Maximal assist (Pt 25 - 49%/lift and lower) Chair/bed transfer assistive device: Armrests Mechanical lift: Maximove   Locomotion Ambulation Ambulation activity did not occur: Safety/medical concerns   Max distance: 40 Assist level: Maximal assist (Pt 25 - 49%)   Wheelchair   Type: Manual Max wheelchair distance: 250 Assist Level: Supervision or verbal cues  Cognition Comprehension  Comprehension assist level: Understands basic 25 - 49% of the time/ requires cueing 50 - 75% of the time  Expression Expression assist level: Expresses basis less than 25% of the time/requires cueing >75% of the time.  Social Interaction Social Interaction assist level: Interacts appropriately 75 - 89% of the time - Needs redirection for appropriate language or to initiate interaction.  Problem Solving Problem solving assist level: Solves basic less than 25% of the time - needs direction nearly all the time or does not effectively solve problems and may need a restraint for safety  Memory Memory assist level: Recognizes or recalls less than 25% of the time/requires cueing greater than 75% of the time   Medical Problem List and Plan: 1. 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 DVT Prophylaxis/Anticoagulation: Subcutaneous heparin  3. Pain Management: Hydrocodone as needed 4. Dysphagia. Diet advanced to D2/thins after MBS yesterda  -attention/cognitive factors still play a role in intake  -PEG feeds/H20 boluses  -continue megace to help with po appetite---may ultimately need to reduce feeds to better stimulate app, but not now  5. Neuropsych: This patient is not capable of making decisions on her own behalf. 6. Skin/Wound Care: Routine skin checks,     8. Fluids/Electrolytes/Nutrition: following lytes---follow up labs reviewed   -potassium normal 9. Seizure prophylaxis. Keppra held for somnolence EEG neg, stopped per neuro 10. Leukocytosis. Wbc's down  to 10.1  -I personally reviewed the patient's labs today.  11. Hypertension. Cozaar 100 mg daily. Monitor with increased mobility  -continue current regimen 12. Hyperlipidemia. Lipitor 13. Lethargy post stroke: generally improved. Sleep cycle improved  -continue ritalin at 10mg   bid   14. Low grade temp resolved  - asp precautions.   15. 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 Loose stool: now soft/formed  -continue fiber  -continue probiotic     LOS (Days) 24 A FACE TO FACE EVALUATION WAS PERFORMED  Tuleen Mandelbaum T 09/04/2015 8:32 AM

## 2015-09-04 NOTE — Progress Notes (Signed)
Physical Therapy Session Note  Patient Details  Name: [name] MRN: [id number] Date of Birth: October 17, 1940  Today's Date: 09/04/2015 PT Individual Time: 1100-1200 PT Individual Time Calculation (min): 60 min   Short Term Goals: Week 3:  PT Short Term Goal 1 (Week 3): = LTGs due to anticipated LOS  Skilled Therapeutic Interventions/Progress Updates:   Patient in wheelchair at RN station, propelled to gym via L hemi technique with supervision and one verbal cue to place LUE on wheelchair rim instead of brake. Gait training using L hemiwalker in controlled environment x 40 ft + 20 ft with max A overall and manual facilitation for weight shift to L to advance RLE, max A multimodal cues for upright posture, HW placement, and total cues for sequencing turning to sit on mat with patient sitting on therapist due to difficulty with sequencing versus fatigue. Patient able to advance/place RLE 75% of time with assist for placement 25% of time and assist for R knee stability in stance. Patient demonstrated increased circumduction RLE this date. Stair training up/down 4 (6") stairs using L rail x 2 with max A to place and stabilize RLE with max cues for sequencing ascending leading with RLE, patient able to recall ascending leading with LLE. In ADL apartment, performed squat pivot transfer to bed with mod A and back to wheelchair with min A, mod cues for sequencing and technique and practiced bed mobility on regular bed to L with focus on sequencing and technique and attention to R side of body, min-mod A overall for LE management. Patient left sitting in wheelchair with son present in room.   Therapy Documentation Precautions:  Precautions Precautions: Fall Precaution Comments: R hemiplegia, R neglect, nonverbal Restrictions Weight Bearing Restrictions: No Pain: Pain Assessment Pain Assessment: No/denies pain   See Function Navigator for Current Functional Status.   Therapy/Group: Individual  Therapy  [name] 09/04/2015, 12:15 PM

## 2015-09-04 NOTE — Progress Notes (Signed)
Occupational Therapy Session Note  Patient Details  Name: ENISA ROYSDON MRN: [id number] Date of Birth: 11/19/1940  Today's Date: 09/04/2015 OT Individual Time: 1000-1100 OT Individual Time Calculation (min): 60 min    Short Term Goals: Week 4:  OT Short Term Goal 1 (Week 4): STG=LYG secondary to ELOS  Skilled Therapeutic Interventions/Progress Updates:    Pt resting in bed upon arrival.  Pt incontinent of bladder and required tot A for toileting tasks at bed level.  Pt required mod A for supine>sit EOB in preparation for bathing and dressing tasks at bedside.  Pt maintained static sitting balance at supervision level.  Pt donned shirt this morning with min A, requiring assistance to thread RUE into sleeve.  Pt also required assistance to thread her RLE into pants.  Pt requires max multimodal cues to attend to her RUE and RLE during dressing tasks but not her bathing tasks.  Pt performed squat pivot transfer to w/c with mod A and completed grooming tasks in w/c at sink.  Pt transitioned to [facility] gym and engaged in simple tasks with one step commands.  Pt requested to place colored pegs onto peg board.  Pt selected the correct color and place onto peg board approx 75% of time.  Pt was able to name color of pegs inconsistently (approx 50%) of time.  [name] accuracy declined as session continued.  Pt remained in w/c with QRB and half lap tray in place.   Therapy Documentation Precautions:  Precautions Precautions: Fall Precaution Comments: R hemiplegia, R neglect, nonverbal Restrictions Weight Bearing Restrictions: No   Pain: Pain Assessment Pain Assessment: No/denies pain  See Function Navigator for Current Functional Status.   Therapy/Group: Individual Therapy  [name] 09/04/2015, 12:02 PM

## 2015-09-04 NOTE — Progress Notes (Signed)
Occupational Therapy Weekly Progress Note  Patient Details  Name: [name] MRN: [id number] Date of Birth: [birth year]  Beginning of progress report period: Aug 27, 2015 End of progress report period: Sep 04, 2015  Patient has met 4 of 4 short term goals. Pt has made steady progress with BADLs this past week.  Pt continues to required mod verbal cues for hemi dressing techniques and to attend to her R and locate items on her R.  Pt exhibits continued impulsivity with transitional movements.  Pt continues to exhibit occasional apraxia with bathing and grooming tasks and some perseveration with bathing tasks.   Patient continues to demonstrate the following deficits: aphasia,  R hemiplegia with hypertonus, apraxia, R inattention, cognitive and perceptual deficits, decreased balance and trunk control and therefore will continue to benefit from skilled OT intervention to enhance overall performance with BADL.  Patient progressing toward long term goals..  Continue plan of care.  OT Short Term Goals Week 3:  OT Short Term Goal 1 (Week 3): Pt will participate in self care tasks for 2 mins in order to decrease level of assist with self care. OT Short Term Goal 1 - Progress (Week 3): Met OT Short Term Goal 2 (Week 3): Pt will locate 3 items to the R during self care tasks by pointing to them in order to demonstrate visual scanning with mod cues OT Short Term Goal 2 - Progress (Week 3): Met OT Short Term Goal 3 (Week 3): Pt will sit EOB with supervision for 2 mins while engaged in self care tasks OT Short Term Goal 3 - Progress (Week 3): Met OT Short Term Goal 4 (Week 3): Pt will incorporate hemi dressing techniques (threading RUE/RLE into clothing) with mod verbal cues OT Short Term Goal 4 - Progress (Week 3): Met Week 4:  OT Short Term Goal 1 (Week 4): STG=LYG secondary to ELOS      Therapy Documentation Precautions:  Precautions Precautions: Fall Precaution Comments: R hemiplegia, R  neglect, nonverbal Restrictions Weight Bearing Restrictions: No  See Function Navigator for Current Functional Status.    [name] [facility] 09/04/2015, 6:35 AM

## 2015-09-04 NOTE — Progress Notes (Signed)
Patient noted to have awoken from sleep. Breathing rapidly and pointing to her lower mid chest area. VSS. No acute distress noted. Patient cued to take slow deep breaths. Tube feed placed on hold, as patient requested HOB to be lowered. Patient able to calm breathing and states she feels more relaxed. Will continue to monitor.

## 2015-09-04 NOTE — Progress Notes (Signed)
Speech Language Pathology Daily Session Note  Patient Details  Name: [name] MRN: [id number] Date of Birth: 01/02/1941  Today's Date: 09/04/2015 SLP Individual Time: [id number] [id number] Individual Time Calculation (min): 30 min  Short Term Goals: Week 4: SLP Short Term Goal 1 (Week 4): Patient will consume current diet without overt s/s of aspiration with supervision verbal cues for use of swallowing compensatory strategies.  SLP Short Term Goal 2 (Week 4): Patient will demonstrate efficient mastication of Dys. 3 textures with complete oral clearance over 2 consecutive sessions prior to upgrade with Min A verbal cues.  SLP Short Term Goal 3 (Week 4): Patient will answer basic yes/no questions utilizing multimodal communication with 75% accuracy with Mod A multimodal cues.  SLP Short Term Goal 4 (Week 4): Patient will name functional items at the word level with mod  A multimodal cues in 75% of opportunities.  SLP Short Term Goal 5 (Week 4): Pt will complete close-ended sentences with >75% acc mod A.  SLP Short Term Goal 6 (Week 4): Pt will demonstrate reading comprehension at the word level with 75% acc min A.  Skilled Therapeutic Interventions: Skilled ST session focused on dysphagia and cognition goals. SLP facilitated goals by providing max encouragement to consume trials of dysphagia 3 textures. Pt nonverbally agreed to consumed 2 minimal bites (1/4 tsp each bolus) of trials. Pt with min mastication and min lingual manipulation of bolus noted. Pt cleared well. Pt answered moderate yes/no questions with 85% accuracy and min A verbal cues. Pt demonstrated reading comprehension at the word level with 90% accuracy and phonemic cues helpful on 10% of opportunities. Pt able to complete close ended sentence with field of 2 word choices for 80% and mod A verbal cues. Pt was left in bed with alarms in place and all needs within reach. Continue current plan of care.   Function:  Eating Eating    Modified Consistency Diet: Yes Eating Assist Level: Supervision or verbal cues Eating Assistive Device Comment: Continuous tube feeding at night Eating Set Up Assist For: Parenteral or tube feed supplies       Cognition Comprehension Comprehension assist level: Understands basic 25 - 49% of the time/ requires cueing 50 - 75% of the time  Expression   Expression assist level: Expresses basis less than 25% of the time/requires cueing >75% of the time.  Social Interaction Social Interaction assist level: Interacts appropriately 75 - 89% of the time - Needs redirection for appropriate language or to initiate interaction.  Problem Solving Problem solving assist level: Solves basic less than 25% of the time - needs direction nearly all the time or does not effectively solve problems and may need a restraint for safety  Memory Memory assist level: Recognizes or recalls less than 25% of the time/requires cueing greater than 75% of the time    Pain Pain Assessment Pain Assessment: No/denies pain  Therapy/Group: Individual Therapy  Macario Shear 09/04/2015, 12:10 PM

## 2015-09-05 ENCOUNTER — Inpatient Hospital Stay (HOSPITAL_COMMUNITY): Payer: Medicare Other

## 2015-09-05 ENCOUNTER — Inpatient Hospital Stay (HOSPITAL_COMMUNITY): Payer: Medicare Other | Admitting: Speech Pathology

## 2015-09-05 ENCOUNTER — Inpatient Hospital Stay (HOSPITAL_COMMUNITY): Payer: Medicare Other | Admitting: Physical Therapy

## 2015-09-05 MED ORDER — JEVITY 1.2 CAL PO LIQD
ORAL | Status: AC
  Filled 2015-09-05: qty 237

## 2015-09-05 MED ORDER — JEVITY 1.5 CAL/FIBER PO LIQD
1000.0000 mL | ORAL | Status: DC
  Administered 2015-09-05 – 2015-09-08 (×4): 1000 mL
  Filled 2015-09-05 (×10): qty 1000

## 2015-09-05 NOTE — Progress Notes (Signed)
[facility]IT[facility]     PROGRESS NOTE    Subjective/Complaints: Up in bed. Alert. A little restless.  ROS: remain limited due to cognitive status  Objective: Vital Signs: Blood pressure 128/71, pulse 76, temperature 98.2 F (36.8 C), temperature source Oral, resp. rate 18, weight 63.504 kg (140 lb), SpO2 99 %. No results found. No results for input(s): WBC, HGB, HCT, PLT in the last 72 hours. No results for input(s): NA, K, CL, GLUCOSE, BUN, CREATININE, CALCIUM in the last 72 hours.  Invalid input(s): CO CBG (last 3)  No results for input(s): GLUCAP in the last 72 hours.  Wt Readings from Last 3 Encounters:  09/05/15 63.504 kg (140 lb)  08/05/15 64.5 kg (142 lb 3.2 oz)  06/14/11 64.501 kg (142 lb 3.2 oz)    Physical Exam:  Constitutional: She appears well-developed, well nourished. NAD. HENT: Normocephalic. Atraumatic.  Cardiovascular: Normal rate and regular rhythm. + systolic Murmur Respiratory: Effort normal and breath sounds normal. No respiratory distress. no wheezes or rales GI: Soft. Bowel sounds are normal. gtube site appears clean without drainage  Neurological: non-verbal for me Alert.  Makes good eye contact when awake.  Follows simple commands DTRs 3+ right upper and right lower extremity   Motor:  Right upper extremity and right lower extremity:  0/5 Left upper and left lower extremity >/3/5 or more (patient not participating in MMT) Flexor tone RUE---1-2/4, tr to1/4 RLE Skin: Skin is warm and dry.  Psych:  Calm/flat  Assessment/Plan: 1. 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 Physiatrist is providing close team supervision and 24 hour management of active medical problems listed below. Physiatrist and rehab team continue to assess barriers to discharge/monitor patient progress toward  functional and medical goals.  Function:  Bathing Bathing position Bathing activity did not occur: N/A Position: Shower  Bathing parts Body parts bathed by patient: Right arm, Chest, Abdomen, Front perineal area, Right upper leg, Left upper leg Body parts bathed by helper: Left arm, Buttocks, Right lower leg, Left lower leg  Bathing assist Assist Level:  (Total assist)      Upper Body Dressing/Undressing Upper body dressing Upper body dressing/undressing activity did not occur: N/A What is the patient wearing?: Pull over shirt/dress     Pull over shirt/dress - Perfomed by patient: Thread/unthread left sleeve, Put head through opening, Pull shirt over trunk Pull over shirt/dress - Perfomed by helper: Thread/unthread right sleeve        Upper body assist Assist Level: Touching or steadying assistance(Pt > 75%)      Lower Body Dressing/Undressing Lower body dressing Lower body dressing/undressing activity did not occur: N/A What is the patient wearing?: Pants     Pants- Performed by patient: Thread/unthread left pants leg Pants- Performed by helper: Thread/unthread right pants leg, Pull pants up/down, Fasten/unfasten pants Non-skid slipper socks- Performed by patient: Don/doff left sock Non-skid slipper socks- Performed by helper: Don/doff right sock   Socks - Performed by helper: Don/doff right sock, Don/doff left sock   Shoes - Performed by helper: Don/doff right shoe, Don/doff left shoe, Fasten right, Fasten left       TED Hose - Performed by helper: Don/doff right TED hose, Don/doff left TED hose  Lower body assist Assist for lower body dressing: 2 Helpers      Toileting Toileting Toileting activity did not occur: No continent bowel/bladder event  Toileting steps completed by patient: Performs perineal hygiene Toileting steps completed by helper: Adjust clothing prior to toileting, Performs perineal hygiene, Adjust clothing after toileting Toileting Assistive Devices:  Toilet aid  Toileting assist Assist level: Two helpers   Transfers Chair/bed transfer   Chair/bed transfer method: Squat pivot Chair/bed transfer assist level: Moderate assist (Pt 50 - 74%/lift or lower) Chair/bed transfer assistive device: Armrests Mechanical lift: Maximove   Locomotion Ambulation Ambulation activity did not occur: Safety/medical concerns   Max distance: 40 Assist level: Maximal assist (Pt 25 - 49%)   Wheelchair   Type: Manual Max wheelchair distance: 150 Assist Level: Supervision or verbal cues  Cognition Comprehension Comprehension assist level: Understands basic 25 - 49% of the time/ requires cueing 50 - 75% of the time  Expression Expression assist level: Expresses basis less than 25% of the time/requires cueing >75% of the time.  Social Interaction Social Interaction assist level: Interacts appropriately 75 - 89% of the time - Needs redirection for appropriate language or to initiate interaction.  Problem Solving Problem solving assist level: Solves basic less than 25% of the time - needs direction nearly all the time or does not effectively solve problems and may need a restraint for safety  Memory Memory assist level: Recognizes or recalls less than 25% of the time/requires cueing greater than 75% of the time   Medical Problem List and Plan: 1. 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 DVT Prophylaxis/Anticoagulation: Subcutaneous heparin  3. Pain Management: Hydrocodone as needed 4. Dysphagia. Diet advanced to D2/thins ---eating more.  -attention/cognitive factors still play a role in intake  -PEG feeds/H20 boluses  -continue megace   -reduce TF to help stimulate appetite  5. Neuropsych: This patient is not capable of making decisions on her own behalf. 6. Skin/Wound Care: Routine skin checks,     8.  Fluids/Electrolytes/Nutrition: following lytes---follow up labs reviewed   -potassium normal 9. Seizure prophylaxis. Keppra held for somnolence EEG neg, stopped per neuro 10. Leukocytosis. Wbc's down to 10.1  11. Hypertension. Cozaar 100 mg daily. Monitor with increased mobility  -continue current regimen 12. Hyperlipidemia. Lipitor 13. Lethargy post stroke: improved  -continue ritalin at 10mg  bid   14. Low grade temp resolved  - asp precautions.   15. 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 Loose stool: now soft/formed  -continue fiber  -continue probiotic     LOS (Days) 25 A FACE TO FACE EVALUATION WAS PERFORMED  Rosemae Mcquown T 09/05/2015 9:22 AM

## 2015-09-05 NOTE — Progress Notes (Signed)
Occupational Therapy Session Note  Patient Details  Name: [name] MRN: [id number] Date of Birth: [birth year]  Today's Date: 09/05/2015 OT Individual Time: 1000-1100 OT Individual Time Calculation (min): 60 min    Short Term Goals: Week 4:  OT Short Term Goal 1 (Week 4): STG=LTG secondary to ELOS  Skilled Therapeutic Interventions/Progress Updates:    Pt resting in bed upon arrival and nodded agreement when questioned if she was ready to get OOB.  Pt replied that she didn't need her brief changed but upon checking it was determined that pt was incontinent of bladder.  Pt assisted with changing her brief and cleaning while at bed level.  Pt required mod A for supine>sit EOB for UB bathing and UB/LB dressing tasks.  Pt continues to required max multimodal cues for attending to her RUE/RLE during dressing tasks.  Pt completed grooming tasks seated in w/c at sink.  Focus on activity tolerance, sitting balance, bed mobility, functional transfers, task initiation, sequencing, and safety awareness to increase independence with BADLs and decrease burden of care.   Therapy Documentation Precautions:  Precautions Precautions: Fall Precaution Comments: R hemiplegia, R neglect, nonverbal Restrictions Weight Bearing Restrictions: No   Pain: Pain Assessment Pain Assessment: No/denies pain    See Function Navigator for Current Functional Status.   Therapy/Group: Individual Therapy  [name] 09/05/2015, 12:11 PM

## 2015-09-05 NOTE — Progress Notes (Signed)
Physical Therapy Session Note  Patient Details  Name: [name] MRN: [id number] Date of Birth: [birth year]  Today's Date: 09/05/2015 PT Individual Time: 1415-1515 PT Individual Time Calculation (min): 60 min   Short Term Goals:  Week 3:  PT Short Term Goal 1 (Week 3): = LTGs due to anticipated LOS  Skilled Therapeutic Interventions/Progress Updates:    Patient received with trade off from RN following toileting. Patient transported in [facility] to [employer] for energy conservation. Squat pivot transfer to mat table with Max A from PT, patient able to push from [facility] armrest for clearance and lateral movement of hips with mod cues from PT.   NMR for sitting balance to identify close pin color and reach to end range L and R with LUE to pass therapist correct color x 15 BLE with min-supervision . Patient required min A x 2 with reach to L to return to midline, but was able to improve weight shift and truncal activation after first 2 repetitions to require only supervision A.  Standing NMR foe weight shift L and R with PT blocking R knee to prevent buckle. Max A to sit>stand at parallel bars and min-mod A from PT to improve weight shift and orientation to midline following each repetition.   WC mobility in controlled environment for 140ft x 3 using LUE and LLE propulsion technique with supervision A from PT as well as mod cues for improved use of LLE to prevent veer to R. WC mobility training also performed in solarium on carpeted floor with Supervision A with mod-max Cues for use of LLE for steering with obstacle navigation.   Patient returned to room and performed wc>bed transfer with squat pivot technique and max A from PT. Sit>supine bed mobility performed with mod A to assist with LE management. Patient left supine in bed with call bell within reach and husband present.   At end of treatment, husband reported noticing, "absent moments" from the patient when asked questions by PT, he reports not  seeing this prior to today.   Therapy Documentation Precautions:  Precautions Precautions: Fall Precaution Comments: R hemiplegia, R neglect, nonverbal Restrictions Weight Bearing Restrictions: No Vital Signs: Therapy Vitals Temp: 98.2 F (36.8 C) Temp Source: Oral Pulse Rate: 80 Resp: 18 BP: (!) 100/51 mmHg Patient Position (if appropriate): Sitting Oxygen Therapy SpO2: 100 %   See Function Navigator for Current Functional Status.   Therapy/Group: Individual Therapy  Lorie Phenix 09/05/2015, 3:25 PM

## 2015-09-05 NOTE — Progress Notes (Addendum)
Speech Language Pathology Daily Session Notes  Patient Details  Name: [name] MRN: [id number] Date of Birth: 05/30/1940  Today's Date: 09/05/2015  Session 1: SLP Individual Time: 0830-0900 SLP Individual Time Calculation (min): 30 min   Session 1: SLP Individual Time: [id number] SLP Individual Time Calculation (min): 45 min  Short Term Goals: Week 4: SLP Short Term Goal 1 (Week 4): Patient will consume current diet without overt s/s of aspiration with supervision verbal cues for use of swallowing compensatory strategies.  SLP Short Term Goal 2 (Week 4): Patient will demonstrate efficient mastication of Dys. 3 textures with complete oral clearance over 2 consecutive sessions prior to upgrade with Min A verbal cues.  SLP Short Term Goal 3 (Week 4): Patient will answer basic yes/no questions utilizing multimodal communication with 75% accuracy with Mod A multimodal cues.  SLP Short Term Goal 4 (Week 4): Patient will name functional items at the word level with mod  A multimodal cues in 75% of opportunities.  SLP Short Term Goal 5 (Week 4): Pt will complete close-ended sentences with >75% acc mod A.  SLP Short Term Goal 6 (Week 4): Pt will demonstrate reading comprehension at the word level with 75% acc min A.  Skilled Therapeutic Interventions:   Session 1: Skilled treatment session focused on cognitive-linguistic goals. SLP facilitated session by providing Min A verbal and visual cues for sequencing with 4 step picture sequencing cards and providing Max A semantic, visual, verbal and phonemic cues to name objects and describe actions within the pictures with 1-2 word utterances. Patient appeared more perseverative on "yes" today in regards to wants/needs. Patient left upright in bed with all needs within reach. Continue with current plan of care.   Session 2: Skilled treatment session focused on speech goals. Upon arrival, patient was supine and bed and transferred to the wheelchair to  maximize arousal. SLP facilitated session by providing Mod A verbal and visual cues for decoding at the sentence level and Mod visual and phonemic cues to complete sentence completion task. Patient also answered yes/no questions with 75% accuracy with Max A to self-monitor and correct errors. Patient left upright in wheelchair at RN station. Continue with current plan of care.   Function:   Cognition Comprehension Comprehension assist level: Understands basic 25 - 49% of the time/ requires cueing 50 - 75% of the time  Expression   Expression assist level: Expresses basis less than 25% of the time/requires cueing >75% of the time.  Social Interaction Social Interaction assist level: Interacts appropriately 75 - 89% of the time - Needs redirection for appropriate language or to initiate interaction.  Problem Solving Problem solving assist level: Solves basic less than 25% of the time - needs direction nearly all the time or does not effectively solve problems and may need a restraint for safety  Memory Memory assist level: Recognizes or recalls less than 25% of the time/requires cueing greater than 75% of the time    Pain Pain Assessment Pain Assessment: No/denies pain  Therapy/Group: Individual Therapy  Jos Cygan 09/05/2015, 12:30 PM

## 2015-09-06 ENCOUNTER — Inpatient Hospital Stay (HOSPITAL_COMMUNITY): Payer: Medicare Other | Admitting: Occupational Therapy

## 2015-09-06 NOTE — Progress Notes (Signed)
Occupational Therapy Session Note  Patient Details  Name: [name] MRN: [id number] Date of Birth: 09/29/1940  Today's Date: 09/06/2015 OT Individual Time: 0930-1030 OT Individual Time Calculation (min): 60 min    Short Term Goals: Week 1:  OT Short Term Goal 1 (Week 1): Pt will participate in self care task for 2 minutes  in order to decrease level of assist with self care.  OT Short Term Goal 1 - Progress (Week 1): Progressing toward goal OT Short Term Goal 2 (Week 1): Pt will locate 3 items to the R during self care tasks by pointing to them in order to demonstrate visual scanning with mod cues. OT Short Term Goal 2 - Progress (Week 1): Progressing toward goal OT Short Term Goal 3 (Week 1): Pt will perform rolls to the left and right with max A or less during LB clothing management and hygiene n order to decrease level of care. OT Short Term Goal 3 - Progress (Week 1): Progressing toward goal Week 2:  OT Short Term Goal 1 (Week 2): Pt will participate in self care task for 2 minutes in order to decrease level of assist with self care. OT Short Term Goal 1 - Progress (Week 2): Progressing toward goal OT Short Term Goal 2 (Week 2): Pt will locate 3 items to the R during self care tasks by pointing to them in order to demonstrate visual scanning with mod cues OT Short Term Goal 2 - Progress (Week 2): Progressing toward goal OT Short Term Goal 3 (Week 2): Pt will perform rolls to the left and right with max A or less during LB clothing management and hygiene in order to decrease burden of care OT Short Term Goal 3 - Progress (Week 2): Met Week 3:  OT Short Term Goal 1 (Week 3): Pt will participate in self care tasks for 2 mins in order to decrease level of assist with self care. OT Short Term Goal 1 - Progress (Week 3): Met OT Short Term Goal 2 (Week 3): Pt will locate 3 items to the R during self care tasks by pointing to them in order to demonstrate visual scanning with mod cues OT  Short Term Goal 2 - Progress (Week 3): Met OT Short Term Goal 3 (Week 3): Pt will sit EOB with supervision for 2 mins while engaged in self care tasks OT Short Term Goal 3 - Progress (Week 3): Met OT Short Term Goal 4 (Week 3): Pt will incorporate hemi dressing techniques (threading RUE/RLE into clothing) with mod verbal cues OT Short Term Goal 4 - Progress (Week 3): Met  Skilled Therapeutic Interventions/Progress Updates:    OT addressed basic ADLtraining at shower level.    Addressed functional transfers, bed mobility, standing balance, attention, attention, right upper extremity therapeutic activities in functional task.  OT provided tactile and gestural cues for patient to use right arm.  Gestural cues were also required for pt to initiate the tasks.  Pt. Transferred with mod assist to shower and maintained sitting balance with SBA to min asssit.  She appeared very tired after session.  Placed pt back to bed.    Notified RN to check to ensure pt was okay.    Therapy Documentation Precautions:  Precautions Precautions: Fall Precaution Comments: R hemiplegia, R neglect, nonverbal Restrictions Weight Bearing Restrictions: No          See Function Navigator for Current Functional Status.   Therapy/Group: Individual Therapy  ,  J 09/06/2015, 7:00   PM

## 2015-09-06 NOTE — Progress Notes (Addendum)
[name] is a 75 y.o. female [birth year] [id number]  Subjective: No new complaints. No new problems. Slept well. Feeling OK.  Objective: Vital signs in last 24 hours: Temp:  [98 F (36.7 C)-98.2 F (36.8 C)] 98 F (36.7 C) (05/20 0609) Pulse Rate:  [72-80] 72 (05/20 0609) Resp:  [18] 18 (05/20 0609) BP: (100-131)/(51-58) 131/58 mmHg (05/20 0609) SpO2:  [100 %] 100 % (05/20 0609) Weight:  [137 lb 1.6 oz (62.188 kg)] 137 lb 1.6 oz (62.188 kg) (05/20 0500) Weight change: -2 lb 14.4 oz (-1.315 kg) Last BM Date: 09/04/15  Intake/Output from previous day: 05/19 0701 - 05/20 0700 In: 550 [P.O.:300; NG/GT:250] Out: -  Last cbgs: CBG (last 3)  No results for input(s): GLUCAP in the last 72 hours.   Physical Exam General: No apparent distress   HEENT: not dry Lungs: Normal effort. Lungs clear to auscultation, no crackles or wheezes. Cardiovascular: Regular rate and rhythm, 2/6 murmur, no edema Abdomen: S/NT/ND; PEG; BS(+) Musculoskeletal:  unchanged Neurological: No new neurological deficits Wounds: N/A    Skin: clear  Aging changes Mental state: Alert, aphasic, cooperative    Lab Results: BMET    Component Value Date/Time   NA 136 09/01/2015 0806   K 4.0 09/01/2015 0806   CL 103 09/01/2015 0806   CO2 23 09/01/2015 0806   GLUCOSE 115* 09/01/2015 0806   BUN 20 09/01/2015 0806   CREATININE 0.63 09/01/2015 0806   CALCIUM 9.2 09/01/2015 0806   GFRNONAA >60 09/01/2015 0806   GFRAA >60 09/01/2015 0806   CBC    Component Value Date/Time   WBC 10.1 09/01/2015 0806   RBC 3.86* 09/01/2015 0806   HGB 11.5* 09/01/2015 0806   HCT 34.4* 09/01/2015 0806   PLT 267 09/01/2015 0806   MCV 89.1 09/01/2015 0806   MCH 29.8 09/01/2015 0806   MCHC 33.4 09/01/2015 0806   RDW 14.2 09/01/2015 0806   LYMPHSABS 1.3 08/12/2015 0742   MONOABS 1.8* 08/12/2015 0742   EOSABS 0.3 08/12/2015 0742   BASOSABS 0.0 08/12/2015 0742    Studies/Results: No results found.  Medications:  I have reviewed the patient's current medications.  Assessment/Plan:  1. 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 DVT Prophylaxis/Anticoagulation: Subcutaneous heparin  3. Pain Management: Hydrocodone as needed 4. Dysphagia. Diet advanced to D2/thins ---eating more. -attention/cognitive factors still play a role in intake -PEG feeds/H20 boluses -continue megace  -reduce TF to help stimulate appetite  5. Neuropsych: This patient is not capable of making decisions on her own behalf. 6. Skin/Wound Care: Routine skin checks,  8. Fluids/Electrolytes/Nutrition: following lytes---follow up labs reviewed  -potassium normal 9. Seizure prophylaxis. Keppra held for somnolence EEG neg, stopped per neuro 10. Leukocytosis. Wbc's down to 10.1  11. Hypertension. Cozaar 100 mg daily. Monitor with increased mobility -continue current regimen 12. Hyperlipidemia. Lipitor 13. Lethargy post stroke: improved -continue ritalin at 10mg  bid 14. Low grade temp resolved - asp precautions.  15. Spasticity: PRAFO/WHO RUE RLE -continue ROM/modalities/taping -increased baclofen to 10mg  TID 16. Loose stool: now soft/formed -continue fiber -continue probiotic     Length of stay, days: [place] , MD 09/06/2015, 10:21 AM

## 2015-09-07 ENCOUNTER — Inpatient Hospital Stay (HOSPITAL_COMMUNITY): Payer: Medicare Other | Admitting: Occupational Therapy

## 2015-09-07 ENCOUNTER — Inpatient Hospital Stay (HOSPITAL_COMMUNITY): Payer: Medicare Other

## 2015-09-07 NOTE — Progress Notes (Signed)
[name] is a 75 y.o. female 1941-02-21 [id number]  Subjective: No new complaints. No new problems. Slept well. Feeling sad  Objective: Vital signs in last 24 hours: Temp:  [97.9 F (36.6 C)-98.3 F (36.8 C)] 97.9 F (36.6 C) (05/21 0415) Pulse Rate:  [73-74] 74 (05/21 0415) Resp:  [18] 18 (05/21 0415) BP: (115-148)/(63-73) 148/73 mmHg (05/21 0415) SpO2:  [100 %] 100 % (05/21 0415) Weight:  [137 lb 8 oz (62.37 kg)] 137 lb 8 oz (62.37 kg) (05/21 0415) Weight change: 6.4 oz (0.181 kg) Last BM Date: 09/06/15  Intake/Output from previous day: 05/20 0701 - 05/21 0700 In: 250 [P.O.:100] Out: -  Last cbgs: CBG (last 3)  No results for input(s): GLUCAP in the last 72 hours.   Physical Exam General: No apparent distress  I spoke with Mikki Santee today HEENT: not dry Lungs: Normal effort. Lungs clear to auscultation, no crackles or wheezes. Cardiovascular: Regular rate and rhythm, no edema, 2/6 murmur Abdomen: S/NT/ND; BS(+)  PEG Musculoskeletal:  unchanged Neurological: No new neurological deficits Wounds: N/A    Skin: clear  Aging changes Mental state: Alert, aphasic, cooperative    Lab Results: BMET    Component Value Date/Time   NA 136 09/01/2015 0806   K 4.0 09/01/2015 0806   CL 103 09/01/2015 0806   CO2 23 09/01/2015 0806   GLUCOSE 115* 09/01/2015 0806   BUN 20 09/01/2015 0806   CREATININE 0.63 09/01/2015 0806   CALCIUM 9.2 09/01/2015 0806   GFRNONAA >60 09/01/2015 0806   GFRAA >60 09/01/2015 0806   CBC    Component Value Date/Time   WBC 10.1 09/01/2015 0806   RBC 3.86* 09/01/2015 0806   HGB 11.5* 09/01/2015 0806   HCT 34.4* 09/01/2015 0806   PLT 267 09/01/2015 0806   MCV 89.1 09/01/2015 0806   MCH 29.8 09/01/2015 0806   MCHC 33.4 09/01/2015 0806   RDW 14.2 09/01/2015 0806   LYMPHSABS 1.3 08/12/2015 0742   MONOABS 1.8* 08/12/2015 0742   EOSABS 0.3 08/12/2015 0742   BASOSABS 0.0 08/12/2015 0742    Studies/Results: No results  found.  Medications: I have reviewed the patient's current medications.  Assessment/Plan:  1. 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 DVT Prophylaxis/Anticoagulation: Subcutaneous heparin  3. Pain Management: Hydrocodone as needed 4. Dysphagia. Diet advanced to D2/thins ---eating more. -attention/cognitive factors still play a role in intake -PEG feeds/H20 boluses -continue megace  -reduce TF to help stimulate appetite  5. Neuropsych: This patient is not capable of making decisions on her own behalf. 6. Skin/Wound Care: Routine skin checks,  8. Fluids/Electrolytes/Nutrition: following lytes---follow up labs reviewed  -potassium normal 9. Seizure prophylaxis. Keppra held for somnolence EEG neg, stopped per neuro 10. Leukocytosis. Wbc's down to 10.1  11. Hypertension. Cozaar 100 mg daily. Monitor with increased mobility -continue current regimen 12. Hyperlipidemia. Lipitor 13. Lethargy post stroke: improved. Situational depression... -continue ritalin at 10mg  bid 14. Low grade temp resolved - asp precautions.  15. Spasticity: PRAFO/WHO RUE RLE -continue ROM/modalities/taping -increased baclofen to 10mg  TID 16. Loose stool: now soft/formed -continue fiber -continue probiotic     Length of stay, days: 27  Walker Kehr , MD 09/07/2015, 9:19 AM

## 2015-09-07 NOTE — Progress Notes (Signed)
Occupational Therapy Session Note  Patient Details  Name: [name] MRN: [id number] Date of Birth: [birth year]  Today's Date: 09/07/2015 OT Individual      OT Individual  Time:    0800-0900  1st session:   OT Individual Time Calculation (min): 60 min   Time:    1445-1545    2nd session:   OT Individual Time Calculation (min): 60 min    Short Term Goals: Week 1:  OT Short Term Goal 1 (Week 1): Pt will participate in self care task for 2 minutes  in order to decrease level of assist with self care.  OT Short Term Goal 1 - Progress (Week 1): Progressing toward goal OT Short Term Goal 2 (Week 1): Pt will locate 3 items to the R during self care tasks by pointing to them in order to demonstrate visual scanning with mod cues. OT Short Term Goal 2 - Progress (Week 1): Progressing toward goal OT Short Term Goal 3 (Week 1): Pt will perform rolls to the left and right with max A or less during LB clothing management and hygiene n order to decrease level of care. OT Short Term Goal 3 - Progress (Week 1): Progressing toward goal Week 2:  OT Short Term Goal 1 (Week 2): Pt will participate in self care task for 2 minutes in order to decrease level of assist with self care. OT Short Term Goal 1 - Progress (Week 2): Progressing toward goal OT Short Term Goal 2 (Week 2): Pt will locate 3 items to the R during self care tasks by pointing to them in order to demonstrate visual scanning with mod cues OT Short Term Goal 2 - Progress (Week 2): Progressing toward goal OT Short Term Goal 3 (Week 2): Pt will perform rolls to the left and right with max A or less during LB clothing management and hygiene in order to decrease burden of care OT Short Term Goal 3 - Progress (Week 2): Met Week 3:  OT Short Term Goal 1 (Week 3): Pt will participate in self care tasks for 2 mins in order to decrease level of assist with self care. OT Short Term Goal 1 - Progress (Week 3): Met OT Short Term Goal 2 (Week 3): Pt will  locate 3 items to the R during self care tasks by pointing to them in order to demonstrate visual scanning with mod cues OT Short Term Goal 2 - Progress (Week 3): Met OT Short Term Goal 3 (Week 3): Pt will sit EOB with supervision for 2 mins while engaged in self care tasks OT Short Term Goal 3 - Progress (Week 3): Met OT Short Term Goal 4 (Week 3): Pt will incorporate hemi dressing techniques (threading RUE/RLE into clothing) with mod verbal cues OT Short Term Goal 4 - Progress (Week 3): Met  Skilled Therapeutic Interventions/Progress Updates:      1st session:  Addressed ADL transfers to shower, toilet, bed, sit to stand, standing balance.  Pt in bed upon OT arrival.   Transitioned to EOB with mod assist >wc> toilet >wc > shower seat > wc > bed with mod assist stand pivot technique.  Pt utilzed better motor planning with movements today.  Performed sit to stand with mod assist for peri care.  Pt had no BM on toilet, but later in shower had large BM.  Pt not as tired with all transfers and activity as was previous days.  She was left in bed at end of session.  2nd session:  R NMR, attending to R, midline orientation, postural control, arousal, attention, all functional mobility, sitting balance, activity tolerance, family education.  Pt lying in bed.  Addressed rolling to right and coming to sitting with mod asssit and manual cues to reach with LUE.  Pt transferred to mat and addressed RUE mobilization and  AAROM.  Pt attended to RUE using vision and touch.   Performed rotation with Hearne joint, oscillation and traction for proprioception input.  Pt attended to movements with no cues.  Practiced segmental rotation for lying to sitting with mod assist .  Did bridging for scapular retraction and weight bearing.  Pt taken back to room and left with husband.    Therapy Documentation Precautions:  Precautions Precautions: Fall Precaution Comments: R hemiplegia, R neglect, nonverbal Restrictions Weight  Bearing Restrictions: No    Pain:   none           See Function Navigator for Current Functional Status.   Therapy/Group: Individual Therapy  Lisa Roca 09/07/2015, 3:53 PM

## 2015-09-07 NOTE — Progress Notes (Signed)
Physical Therapy Session Note  Patient Details  Name: [name] MRN: [id number] Date of Birth: 1940-07-31  Today's Date: 09/07/2015 PT Individual Time: 1300-1330 PT Individual Time Calculation (min): 30 min   Short Term Goals: Week 3:  PT Short Term Goal 1 (Week 3): = LTGs due to anticipated LOS  Skilled Therapeutic Interventions/Progress Updates:   Session focused on functional transfers x 4 during session, sitting balance while addressing attention and problem solving while performing matching task with cards, and w/c mobility at supervision level. Pt required mod assist for stand/squat pivot transfers with cues for technique and attention to R foot placement. Returned back to bed end of session and positioned for comfort. Son at bedside with all needs in reach.   Therapy Documentation Precautions:  Precautions Precautions: Fall Precaution Comments: R hemiplegia, R neglect, nonverbal Restrictions Weight Bearing Restrictions: No  Pain: Did not appear to be in pain or distress.    See Function Navigator for Current Functional Status.   Therapy/Group: Individual Therapy  Canary Brim Ivory Broad, PT, DPT  09/07/2015, 2:24 PM

## 2015-09-08 ENCOUNTER — Inpatient Hospital Stay (HOSPITAL_COMMUNITY): Payer: Medicare Other

## 2015-09-08 ENCOUNTER — Inpatient Hospital Stay (HOSPITAL_COMMUNITY): Payer: Medicare Other | Admitting: Speech Pathology

## 2015-09-08 ENCOUNTER — Inpatient Hospital Stay (HOSPITAL_COMMUNITY): Payer: Medicare Other | Admitting: Physical Therapy

## 2015-09-08 NOTE — Progress Notes (Signed)
Speech Language Pathology Daily Session Note  Patient Details  Name: ENIJAH DRAFT MRN: [id number] Date of Birth: 01-16-1941  Today's Date: 09/08/2015 SLP Individual Time: [id number] SLP Individual Time Calculation (min): 45 min  Short Term Goals: Week 4: SLP Short Term Goal 1 (Week 4): Patient will consume current diet without overt s/s of aspiration with supervision verbal cues for use of swallowing compensatory strategies.  SLP Short Term Goal 2 (Week 4): Patient will demonstrate efficient mastication of Dys. 3 textures with complete oral clearance over 2 consecutive sessions prior to upgrade with Min A verbal cues.  SLP Short Term Goal 3 (Week 4): Patient will answer basic yes/no questions utilizing multimodal communication with 75% accuracy with Mod A multimodal cues.  SLP Short Term Goal 4 (Week 4): Patient will name functional items at the word level with mod  A multimodal cues in 75% of opportunities.  SLP Short Term Goal 5 (Week 4): Pt will complete close-ended sentences with >75% acc mod A.  SLP Short Term Goal 6 (Week 4): Pt will demonstrate reading comprehension at the word level with 75% acc min A.  Skilled Therapeutic Interventions: Skilled treatment session focused on cognitive-linguistic goals. SLP facilitated session by providing Max A verbal, visual, phonemic and written cues for generating a phrase with the use of visual aids in regards to word-finding and for self-monitoring and correcting verbal errors due to perseveration. Patient's husband present throughout session and provided encouragement. Patient left upright in bed with all needs within reach. Continue with current plan of care.     Function:  Cognition Comprehension Comprehension assist level: Understands basic 75 - 89% of the time/ requires cueing 10 - 24% of the time  Expression   Expression assist level: Expresses basic 25 - 49% of the time/requires cueing 50 - 75% of the time. Uses single words/gestures.   Social Interaction Social Interaction assist level: Interacts appropriately 75 - 89% of the time - Needs redirection for appropriate language or to initiate interaction.  Problem Solving Problem solving assist level: Solves basic 50 - 74% of the time/requires cueing 25 - 49% of the time  Memory Memory assist level: Recognizes or recalls 25 - 49% of the time/requires cueing 50 - 75% of the time    Pain No/Denies Pain   Therapy/Group: Individual Therapy  Jacobie Stamey 09/08/2015, 3:54 PM

## 2015-09-08 NOTE — Progress Notes (Signed)
Occupational Therapy Session Note  Patient Details  Name: [name] MRN: [id number] Date of Birth: 1940-08-18  Today's Date: 09/08/2015 OT Individual Time: 1000-1100 OT Individual Time Calculation (min): 60 min    Short Term Goals: Week 4:  OT Short Term Goal 1 (Week 4): STG=LYG secondary to ELOS  Skilled Therapeutic Interventions/Progress Updates:    Pt resting in bed upon arrival.  Pt initially replied, when questioned, that her brief did not require changing, but pt was incontinent of bladder.  Pt assisted with completing hygiene and changing her brief.  Pt also bathed her LLE while supine in bed.  Pt required max A for supine>sit EOB in preparation for bathing her UB and dressing.  Pt bathes her RUE without cues but continues to require max multimodal cues to attend to her RUE and RLE during dressing tasks.  Pt assisted with donning her abdominal binder.  Pt completed grooming tasks at sink with supervision.  Pt transitioned to seated task which included following one step commands for selecting the correct playing card for an array.  Pt was accurate approx 50% of the time without verbal cues.  [name] accuracy improved to 75% with questioning cues.  Pt returned to room with husband present and remained in w/c with QRB in place and half lap tray.    Therapy Documentation Precautions:  Precautions Precautions: Fall Precaution Comments: R hemiplegia, R neglect, nonverbal Restrictions Weight Bearing Restrictions: No   Pain: Pain Assessment Faces Pain Scale: No hurt  See Function Navigator for Current Functional Status.   Therapy/Group: Individual Therapy  [name] 09/08/2015, 11:03 AM

## 2015-09-08 NOTE — Progress Notes (Signed)
Nutrition Follow-up  DOCUMENTATION CODES:   Severe malnutrition in context of acute illness/injury  INTERVENTION:  Nocturnal tube feeds of Jevity 1.5 formula via PEG at goal rate of 70 ml/hr (if patient consumes <50% of meals) x 12 hours overnight (7 pm-7 am) with 30 ml prostat BID to provide 1460 kcals (meets 88% needs), 84 g protein (100% of needs), and 639 ml of free water, otherwise continue at rate of 35 ml/hr x 12 hours overnight if po intake >50%.  Continue free water flushes of 250 mls every 8 hours.  Continue Ensure Enlive po TID, each supplement provides 350 kcal and 20 grams of protein  RD to continue to monitor.   NUTRITION DIAGNOSIS:   Malnutrition related to acute illness as evidenced by energy intake < or equal to 50% for > or equal to 5 days, moderate depletions of muscle mass; ongoing  GOAL:   Patient will meet greater than or equal to 90% of their needs; met  MONITOR:   PO intake, Diet advancement, TF tolerance, Weight trends, Labs, I & O's  REASON FOR ASSESSMENT:   Consult Enteral/tube feeding initiation and management  ASSESSMENT:   75 y.o. right handed female with history of hypertension. 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  Meal completion has been 10-50% with today's intake at 10%. Pt has Ensure ordered, however per RN has been refusing most of them. Nocturnal tube feeds have been modified. If pt consumes >50% at meals for the day, then tube feeding goal rate will be 35 ml/hr, however if po intake is <50% then goal rate will stay at 70 ml/hr. RD to continue to monitor. Labs and medications reviewed.   Diet Order:  DIET DYS 2 Room service appropriate?: Yes; Fluid consistency:: Thin  Skin:  Reviewed, no issues  Last BM:  5/21  Height:   Ht Readings from Last 1 Encounters:  08/01/15 5' 4"  (1.626 m)    Weight:   Wt Readings from Last 1  Encounters:  09/08/15 140 lb 3.2 oz (63.594 kg)    Ideal Body Weight:  54.5 kg  BMI:  Body mass index is 24.05 kg/(m^2).  Estimated Nutritional Needs:   Kcal:  1650-1850  Protein:  70-85 grams  Fluid:  1.6 - 1.8 L/day  EDUCATION NEEDS:   No education needs identified at this time  Corrin Parker, MS, RD, LDN Pager # [phone] After hours/ weekend pager # [phone]

## 2015-09-08 NOTE — Progress Notes (Signed)
Physical Therapy Session Note  Patient Details  Name: [name] MRN: [id number] Date of Birth: [birth year]  Today's Date: 09/08/2015 PT Individual Time: 1410-1505 PT Individual Time Calculation (min): 55 min   Short Term Goals: Week 4:  PT Short Term Goal 1 (Week 4): = LTGs due to anticipated LOS  Skilled Therapeutic Interventions/Progress Updates:   Pt received in w/c; assisted pt with donning shoes and pt performed w/c mobility x 120' to gym with L hemi technique and supervision.  Performed transfer to mat squat pivot to R with mod A and from sit > L sidelying with min A.  Performed NMR; see below for details.  Returned to w/c squat pivot to L with mod A and pt handed off to SLP for next session.  Therapy Documentation Precautions:  Precautions Precautions: Fall Precaution Comments: R hemiplegia, R neglect, nonverbal Restrictions Weight Bearing Restrictions: No Vital Signs: Therapy Vitals Pulse Rate: 78 Resp: 18 BP: 114/66 mmHg Patient Position (if appropriate): Sitting Oxygen Therapy SpO2: 94 % O2 Device: Not Delivered Pain: Pain Assessment Pain Assessment: Faces Faces Pain Scale: No hurt Other Treatments: Treatments Neuromuscular Facilitation: Right;Left;Lower Extremity;Activity to increase coordination;Activity to increase motor control;Activity to increase timing and sequencing;Activity to increase grading;Activity to increase sustained activation;Activity to increase lateral weight shifting;Activity to increase anterior-posterior weight shifting beginning in L sidelying with RLE on powder board with mirror in front of pt for visual feedback with focus on activation of hip flexion/extension, knee flexion/extension with gravity minimized.  Pt only able to volitionally activate hip flexion<>extension with tactile cues; unable to activate knee flexion<>extension even with visual target to kick at.  Transitioned to tall kneeling with UE support on chair with max A;  continued NMR-weight shifting, activation of RLE and trunk, head and postural control/righting in tall kneeling with LLE forward and backwards advancement x 1 due to pt with impaired ability to sequence or initiate.  Pt required max-total A to transition back to sitting EOB and pt given extra time to recover due to increased RR and HR after activity.     See Function Navigator for Current Functional Status.   Therapy/Group: Individual Therapy  Raylene Everts [facility] 09/08/2015, 4:19 PM

## 2015-09-08 NOTE — Progress Notes (Signed)
Speech Language Pathology Daily Session Note  Patient Details  Name: [name] MRN: [id number] Date of Birth: 31-May-1940  Today's Date: 09/08/2015 SLP Individual Time: 1500-1530 SLP Individual Time Calculation (min): 30 min  Short Term Goals: Week 4: SLP Short Term Goal 1 (Week 4): Patient will consume current diet without overt s/s of aspiration with supervision verbal cues for use of swallowing compensatory strategies.  SLP Short Term Goal 2 (Week 4): Patient will demonstrate efficient mastication of Dys. 3 textures with complete oral clearance over 2 consecutive sessions prior to upgrade with Min A verbal cues.  SLP Short Term Goal 3 (Week 4): Patient will answer basic yes/no questions utilizing multimodal communication with 75% accuracy with Mod A multimodal cues.  SLP Short Term Goal 4 (Week 4): Patient will name functional items at the word level with mod  A multimodal cues in 75% of opportunities.  SLP Short Term Goal 5 (Week 4): Pt will complete close-ended sentences with >75% acc mod A.  SLP Short Term Goal 6 (Week 4): Pt will demonstrate reading comprehension at the word level with 75% acc min A.  Skilled Therapeutic Interventions: Pt sitting upright in WC. Initial attempts at language therapy appeared to be frustrating with little success, so chose to focus on cognitive skills with language. 3-step sequencing of pictures completed with 100% acc with mod I due to increased time. 5-step completed with min A for occasional questioning cues. Pt able to self-correct once error was identified. Pt demonstrated somewhat warmer affect this date- frequently reaching out to touch therapist on the arm with good eye contact.    Function:  Eating Eating                 Cognition Comprehension Comprehension assist level: Understands basic 50 - 74% of the time/ requires cueing 25 - 49% of the time  Expression   Expression assist level: Expresses basis less than 25% of the  time/requires cueing >75% of the time.  Social Interaction Social Interaction assist level: Interacts appropriately 75 - 89% of the time - Needs redirection for appropriate language or to initiate interaction.  Problem Solving Problem solving assist level: Solves basic 50 - 74% of the time/requires cueing 25 - 49% of the time  Memory Memory assist level: Recognizes or recalls 25 - 49% of the time/requires cueing 50 - 75% of the time    Pain Pain Assessment Pain Assessment: Faces Faces Pain Scale: No hurt  Therapy/Group: Individual Therapy  Vinetta Bergamo MA, CCC-SLP 09/08/2015, 4:21 PM

## 2015-09-08 NOTE — Progress Notes (Signed)
[place] PHYSICAL MEDICINE & REHABILITATION     PROGRESS NOTE    Subjective/Complaints: Sitting in bed. Very talkative and interactive today. Had a good weekend. Denies pain   ROS: remain limited due to cognitive status  Objective: Vital Signs: Blood pressure 127/68, pulse 75, temperature 98.7 F (37.1 C), temperature source Oral, resp. rate 18, weight 63.594 kg (140 lb 3.2 oz), SpO2 100 %. No results found. No results for input(s): WBC, HGB, HCT, PLT in the last 72 hours. No results for input(s): NA, K, CL, GLUCOSE, BUN, CREATININE, CALCIUM in the last 72 hours.  Invalid input(s): CO CBG (last 3)  No results for input(s): GLUCAP in the last 72 hours.  Wt Readings from Last 3 Encounters:  09/08/15 63.594 kg (140 lb 3.2 oz)  08/05/15 64.5 kg (142 lb 3.2 oz)  06/14/11 64.501 kg (142 lb 3.2 oz)    Physical Exam:  Constitutional: She appears well-developed, well nourished. NAD. HENT: Normocephalic. Atraumatic.  Cardiovascular: Normal rate and regular rhythm. + systolic Murmur Respiratory: Effort normal and breath sounds normal. No respiratory distress. no wheezes or rales GI: Soft. Bowel sounds are normal. gtube site appears clean without drainage  Neurological: non-verbal for me Alert.  Makes good eye contact when awake.  Follows simple commands DTRs 3+ right upper and right lower extremity   Motor:  Right upper extremity and right lower extremity:  0/5 Left upper and left lower extremity >/3/5 or more (patient not participating in MMT) Flexor tone RUE---1-2/4 biceps, trace extensor tone RLE Skin: Skin is warm and dry.  Psych:  Calm/seems to be ion good spirits  Assessment/Plan: 1. 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 Physiatrist is providing close team supervision and 24 hour management of active medical problems listed  below. Physiatrist and rehab team continue to assess barriers to discharge/monitor patient progress toward functional and medical goals.  Function:  Bathing Bathing position Bathing activity did not occur: N/A Position: Shower  Bathing parts Body parts bathed by patient: Right arm, Chest, Abdomen, Front perineal area, Right upper leg, Left upper leg Body parts bathed by helper: Left arm, Buttocks, Right lower leg, Left lower leg, Back  Bathing assist Assist Level: Touching or steadying assistance(Pt > 75%)      Upper Body Dressing/Undressing Upper body dressing Upper body dressing/undressing activity did not occur: N/A What is the patient wearing?: Pull over shirt/dress     Pull over shirt/dress - Perfomed by patient: Thread/unthread left sleeve, Put head through opening Pull over shirt/dress - Perfomed by helper: Thread/unthread right sleeve, Pull shirt over trunk        Upper body assist Assist Level: Touching or steadying assistance(Pt > 75%)      Lower Body Dressing/Undressing Lower body dressing Lower body dressing/undressing activity did not occur: N/A What is the patient wearing?: Pants, Non-skid slipper socks     Pants- Performed by patient: Thread/unthread left pants leg Pants- Performed by helper: Thread/unthread right pants leg, Pull pants up/down, Fasten/unfasten pants Non-skid slipper socks- Performed by patient: Don/doff left sock Non-skid slipper socks- Performed by helper: Don/doff right sock   Socks - Performed by helper: Don/doff right sock, Don/doff left sock   Shoes - Performed by helper: Don/doff right shoe, Don/doff left shoe, Fasten right, Fasten left       TED Hose - Performed by helper: Don/doff right TED hose, Don/doff left TED hose  Lower body assist Assist for lower body dressing: Touching or steadying assistance (Pt > 75%)      Toileting Toileting Toileting activity did not occur: No continent bowel/bladder event Toileting steps completed  by patient: Performs perineal hygiene Toileting steps completed by helper: Adjust clothing prior to toileting, Performs perineal hygiene, Adjust clothing after toileting Toileting Assistive Devices: Grab bar or rail  Toileting assist Assist level: Touching or steadying assistance (Pt.75%)   Transfers Chair/bed transfer   Chair/bed transfer method: Stand pivot Chair/bed transfer assist level: Moderate assist (Pt 50 - 74%/lift or lower) Chair/bed transfer assistive device: Armrests Mechanical lift: Maximove   Locomotion Ambulation Ambulation activity did not occur: Safety/medical concerns   Max distance: 40 Assist level: Maximal assist (Pt 25 - 49%)   Wheelchair   Type: Manual Max wheelchair distance: 120' Assist Level: Supervision or verbal cues  Cognition Comprehension Comprehension assist level: Understands basic 75 - 89% of the time/ requires cueing 10 - 24% of the time, Understands basic 25 - 49% of the time/ requires cueing 50 - 75% of the time  Expression Expression assist level: Expresses basic 25 - 49% of the time/requires cueing 50 - 75% of the time. Uses single words/gestures.  Social Interaction Social Interaction assist level: Interacts appropriately 75 - 89% of the time - Needs redirection for appropriate language or to initiate interaction.  Problem Solving Problem solving assist level: Solves basic 25 - 49% of the time - needs direction more than half the time to initiate, plan or complete simple activities  Memory Memory assist level: Recognizes or recalls 25 - 49% of the time/requires cueing 50 - 75% of the time   Medical Problem List and Plan: 1. Right hemiplegia, aphasia, dysphagia secondary to left basal ganglia hypertensive intracranial hemorrhage  -continue CIR therapies  -future AFO 2. DVT Prophylaxis/Anticoagulation: Subcutaneous heparin  3. Pain Management: Hydrocodone as needed 4. Dysphagia. Diet advanced to D2/thins ---eating more.  -attention/cognitive  factors still play a role in intake  -PEG feeds/H20 boluses  -continue megace   -decreased TF to 35cc/hr overnight. If she eats less than 50% of meals then increase to 75%.  5. Neuropsych: This patient is not capable of making decisions on her own behalf. 6. Skin/Wound Care: Routine skin checks,     8. Fluids/Electrolytes/Nutrition: following lytes---follow up labs reviewed   -potassium normal 9. Seizure prophylaxis. Keppra held for somnolence EEG neg, stopped per neuro 10. Leukocytosis. Wbc's down to 10.1  11. Hypertension. Cozaar 100 mg daily. Monitor with increased mobility  -continue current regimen 12. Hyperlipidemia. Lipitor 13. Lethargy post stroke: improved  -continue ritalin at 10mg  bid   14. Low grade temp resolved  - asp precautions.   15. 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 Loose stool: now soft/formed  -continue fiber  -continue probiotic     LOS (Days) 28 A FACE TO FACE EVALUATION WAS PERFORMED  SWARTZ,ZACHARY T 09/08/2015 9:01 AM

## 2015-09-09 ENCOUNTER — Inpatient Hospital Stay (HOSPITAL_COMMUNITY): Payer: Medicare Other | Admitting: Occupational Therapy

## 2015-09-09 ENCOUNTER — Inpatient Hospital Stay (HOSPITAL_COMMUNITY): Payer: Medicare Other | Admitting: Speech Pathology

## 2015-09-09 ENCOUNTER — Inpatient Hospital Stay (HOSPITAL_COMMUNITY): Payer: Medicare Other | Admitting: Physical Therapy

## 2015-09-09 ENCOUNTER — Inpatient Hospital Stay (HOSPITAL_COMMUNITY): Payer: Medicare Other

## 2015-09-09 NOTE — Progress Notes (Signed)
Speech Language Pathology Daily Session Note  Patient Details  Name: [name] MRN: [id number] Date of Birth: 06-28-1940  Today's Date: 09/09/2015 SLP Individual Time: 1300-1330 SLP Individual Time Calculation (min): 30 min  Short Term Goals: Week 4: SLP Short Term Goal 1 (Week 4): Patient will consume current diet without overt s/s of aspiration with supervision verbal cues for use of swallowing compensatory strategies.  SLP Short Term Goal 2 (Week 4): Patient will demonstrate efficient mastication of Dys. 3 textures with complete oral clearance over 2 consecutive sessions prior to upgrade with Min A verbal cues.  SLP Short Term Goal 3 (Week 4): Patient will answer basic yes/no questions utilizing multimodal communication with 75% accuracy with Mod A multimodal cues.  SLP Short Term Goal 4 (Week 4): Patient will name functional items at the word level with mod  A multimodal cues in 75% of opportunities.  SLP Short Term Goal 5 (Week 4): Pt will complete close-ended sentences with >75% acc mod A.  SLP Short Term Goal 6 (Week 4): Pt will demonstrate reading comprehension at the word level with 75% acc min A.  Skilled Therapeutic Interventions: Skilled treatment session focused on cognitive-linguistic goals. SLP facilitated session by providing Min A verbal and visual cues for problem solving during a basic card task which focused on matching by the same color and number. Patient followed the rules to the task with Min A verbal and visual cues and identified numbers and colors with Max a phonemic and sentence completion cues. Patient transferred back to bed at end of session and required Max verbal cues for safety due to impulsivity. Patient left supine in bed with all needs within reach and alarm on. Continue with current plan of care.    Function:  Cognition Comprehension Comprehension assist level: Understands basic 50 - 74% of the time/ requires cueing 25 - 49% of the time  Expression    Expression assist level: Expresses basis less than 25% of the time/requires cueing >75% of the time.  Social Interaction Social Interaction assist level: Interacts appropriately 75 - 89% of the time - Needs redirection for appropriate language or to initiate interaction.  Problem Solving Problem solving assist level: Solves basic 50 - 74% of the time/requires cueing 25 - 49% of the time  Memory Memory assist level: Recognizes or recalls 25 - 49% of the time/requires cueing 50 - 75% of the time    Pain No/Denies Pain   Therapy/Group: Individual Therapy  Jeanette Moffatt 09/09/2015, 3:54 PM

## 2015-09-09 NOTE — Progress Notes (Signed)
Occupational Therapy Session Note  Patient Details  Name: [name] MRN: [id number] Date of Birth: 1940/09/19  Today's Date: 09/09/2015 OT Individual Time: 1040-1110 OT Individual Time Calculation (min): 30 min    Short Term Goals: Week 4:  OT Short Term Goal 1 (Week 4): STG=LYG secondary to ELOS  Skilled Therapeutic Interventions/Progress Updates: Patient seen for standing balance with focus on R UE and R LE weightbearing as well as opportunities to work on expressive aphasia.  When asked if she could 'feel' her right leg when standing, she shook he her head know.   Decreased proprioception perhaps contributed to her difficulty with right L E weight bearing during this session.      Therapy Documentation Precautions:  Precautions Precautions: Fall Precaution Comments: R hemiplegia, R neglect, nonverbal Restrictions Weight Bearing Restrictions: No   Pain:denied    See Function Navigator for Current Functional Status.   Therapy/Group: Individual Therapy  Alfredia Ferguson [facility] 09/09/2015, 12:52 PM

## 2015-09-09 NOTE — Progress Notes (Signed)
Physical Therapy Session Note  Patient Details  Name: [name] MRN: [id number] Date of Birth: [birth year]  Today's Date: 09/09/2015 PT Individual Time: 0800-0905 PT Individual Time Calculation (min): 65 min   Short Term Goals: Week 4:  PT Short Term Goal 1 (Week 4): = LTGs due to anticipated LOS  Skilled Therapeutic Interventions/Progress Updates:   Patient awake in bed upon arrival. Performed rolling R and L with min cues for sequencing and technique with min A to don abdominal binder and transferred supine > sit using rail with mod A. Patient transferred to wheelchair with mod A and max cues for sequencing. Patient responding yes to need for bathroom, was continent of bowel and bladder. Performed wheelchair <> toilet transfers with total A due to difficulty with safe sequencing. Patient required max multimodal cues for hygiene with setup due to patient continuously attempting to wash BLE instead of perform pericare. Patient performed UB dressing sitting EOB and LB dressing from wheelchair with max A for sit <> stand. At sink, performed oral care with cues to utilize toothpaste, patient able to unscrew cap and squirt toothpaste in mouth without cues, washed face with setup assist, and brushed hair with supervision. RN present for medication administration. Gait training with mod > max A using L hemiwalker x 30 ft in moderately distracting environment with max cues for advancing HW and manual facilitation for weight shift L to facilitate RLE clearance, max cues for upright posture and forward gaze. Patient left sitting in wheelchair with quick release belt on and 1/2 lap tray in place and needs in reach.    Therapy Documentation Precautions:  Precautions Precautions: Fall Precaution Comments: R hemiplegia, R neglect, nonverbal Restrictions Weight Bearing Restrictions: No Pain:  No c/o pain   See Function Navigator for Current Functional Status.   Therapy/Group: Individual  Therapy  [name] 09/09/2015, 12:46 PM

## 2015-09-09 NOTE — Plan of Care (Signed)
Problem: RH Car Transfers Goal: LTG Patient will perform car transfers with assist (PT) LTG: Patient will perform car transfers with assistance (PT).  Outcome: Not Applicable Date Met:  46/27/03 DC changed to SNF

## 2015-09-09 NOTE — Progress Notes (Signed)
[facility]ICINE [facility] REHABILITATION     PROGRESS NOTE    Subjective/Complaints: Lying in bed. Very talkative. Doesn't initiate much but answers me quickly   ROS: remain limited due to cognitive status  Objective: Vital Signs: Blood pressure 128/68, pulse 78, temperature 98 F (36.7 C), temperature source Oral, resp. rate 18, weight 64.093 kg (141 lb 4.8 oz), SpO2 97 %. No results found. No results for input(s): WBC, HGB, HCT, PLT in the last 72 hours. No results for input(s): NA, K, CL, GLUCOSE, BUN, CREATININE, CALCIUM in the last 72 hours.  Invalid input(s): CO CBG (last 3)  No results for input(s): GLUCAP in the last 72 hours.  Wt Readings from Last 3 Encounters:  09/09/15 64.093 kg (141 lb 4.8 oz)  08/05/15 64.5 kg (142 lb 3.2 oz)  06/14/11 64.501 kg (142 lb 3.2 oz)    Physical Exam:  Constitutional: She appears well-developed, well nourished. NAD. HENT: Normocephalic. Atraumatic.  Cardiovascular: Normal rate and regular rhythm. + systolic Murmur Respiratory: Effort normal and breath sounds normal. No respiratory distress. no wheezes or rales GI: Soft. Bowel sounds are normal. gtube site appears clean without drainage  Neurological: non-verbal for me Alert.  Makes good eye contact when awake.  Follows simple commands DTRs 3+ right upper and right lower extremity   Motor:  Right upper extremity and right lower extremity:  0/5 Left upper and left lower extremity >/3/5 or more (patient not participating in MMT) Flexor tone RUE---1-2/4 biceps, trace extensor tone RLE Skin: Skin is warm and dry.  Psych:  Calm/seems to be ion good spirits  Assessment/Plan: 1. 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 Physiatrist is providing close team supervision and 24 hour management of active medical problems listed  below. Physiatrist and rehab team continue to assess barriers to discharge/monitor patient progress toward functional and medical goals.  Function:  Bathing Bathing position Bathing activity did not occur: N/A Position: Sitting EOB  Bathing parts Body parts bathed by patient: Right arm, Chest, Abdomen, Front perineal area, Right upper leg, Left upper leg, Left lower leg Body parts bathed by helper: Left arm, Buttocks, Right lower leg  Bathing assist Assist Level: Touching or steadying assistance(Pt > 75%)      Upper Body Dressing/Undressing Upper body dressing Upper body dressing/undressing activity did not occur: N/A What is the patient wearing?: Pull over shirt/dress     Pull over shirt/dress - Perfomed by patient: Thread/unthread left sleeve, Put head through opening Pull over shirt/dress - Perfomed by helper: Thread/unthread right sleeve, Pull shirt over trunk        Upper body assist Assist Level: Touching or steadying assistance(Pt > 75%)      Lower Body Dressing/Undressing Lower body dressing Lower body dressing/undressing activity did not occur: N/A What is the patient wearing?: Pants, Socks, Shoes     Pants- Performed by patient: Thread/unthread left pants leg Pants- Performed by helper: Thread/unthread right pants leg, Pull pants up/down Non-skid slipper socks- Performed by patient: Don/doff left sock Non-skid slipper socks- Performed by helper: Don/doff right sock Socks - Performed by patient: Don/doff left sock Socks - Performed by helper: Don/doff right sock Shoes - Performed by patient: Don/doff left shoe Shoes - Performed by helper: Fasten right, Fasten left, Don/doff right shoe       TED Hose - Performed by helper: Don/doff right TED hose, Don/doff left TED hose  Lower body assist Assist for lower body dressing: Touching or steadying assistance (Pt > 75%)      Toileting Toileting Toileting activity did not occur: No continent bowel/bladder  event Toileting steps completed by patient: Performs perineal hygiene Toileting steps completed by helper: Adjust clothing prior to toileting, Adjust clothing after toileting Toileting Assistive Devices: Grab bar or rail  Toileting assist Assist level: Touching or steadying assistance (Pt.75%)   Transfers Chair/bed transfer   Chair/bed transfer method: Squat pivot Chair/bed transfer assist level: Moderate assist (Pt 50 - 74%/lift or lower) Chair/bed transfer assistive device: Armrests Mechanical lift: Maximove   Locomotion Ambulation Ambulation activity did not occur: Safety/medical concerns   Max distance: 40 Assist level: Maximal assist (Pt 25 - 49%)   Wheelchair   Type: Manual Max wheelchair distance: 120 Assist Level: Supervision or verbal cues  Cognition Comprehension Comprehension assist level: Understands basic 50 - 74% of the time/ requires cueing 25 - 49% of the time  Expression Expression assist level: Expresses basis less than 25% of the time/requires cueing >75% of the time.  Social Interaction Social Interaction assist level: Interacts appropriately 75 - 89% of the time - Needs redirection for appropriate language or to initiate interaction.  Problem Solving Problem solving assist level: Solves basic 50 - 74% of the time/requires cueing 25 - 49% of the time  Memory Memory assist level: Recognizes or recalls 25 - 49% of the time/requires cueing 50 - 75% of the time   Medical Problem List and Plan: 1. 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 DVT Prophylaxis/Anticoagulation: Subcutaneous heparin  3. Pain Management: Hydrocodone as needed 4. Dysphagia. Diet advanced to D2/thins ---eating more but not enough or consistently  -attention/cognitive factors still play a role in intake  -PEG feeds/H20 boluses  -continue megace   -decreased TF to  35cc/hr overnight. If she eats less than 50% of meals then increase to 75%.  5. Neuropsych: This patient is not capable of making decisions on her own behalf. 6. Skin/Wound Care: Routine skin checks,     8. Fluids/Electrolytes/Nutrition: following lytes---follow up labs reviewed   -potassium normal 9. Seizure prophylaxis. Keppra held for somnolence EEG neg, stopped per neuro 10. Leukocytosis. Wbc's down to 10.1  11. Hypertension. Cozaar 100 mg daily. Monitor with increased mobility  -continue current regimen 12. Hyperlipidemia. Lipitor 13. Lethargy post stroke: improved  -continue ritalin at 10mg  bid beyond dc   14. Low grade temp resolved  - asp precautions.   15. 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 Loose stool: now soft/formed  -continue fiber  -continue probiotic     LOS (Days) 29 A FACE TO FACE EVALUATION WAS PERFORMED  [name] T 09/09/2015 9:05 AM

## 2015-09-09 NOTE — Patient Care Conference (Signed)
Inpatient RehabilitationTeam Conference and Plan of Care Update Date: 09/09/2015   Time: 2:40 PM    Patient Name: KALIANN WOLFINGTON      Medical Record Number: [id number]  Date of Birth: [birth year] Sex: Female         Room/Bed: 4W14C/4W14C-01 Payor Info: Payor: MEDICARE / Plan: MEDICARE PART A AND B / Product Type: *No Product type* /    Admitting Diagnosis: lt bg hemm  Admit Date/Time:  08/11/2015  2:47 PM Admission Comments: No comment available   Primary Diagnosis:  Basal ganglia hemorrhage (HCC) Principal Problem: Basal ganglia hemorrhage [facility])  Patient Active Problem List   Diagnosis Date Noted  . Labile blood pressure   . Somnolence   . Left Basal ganglia hemorrhage ([place]) 08/11/2015  . Hypertensive emergency 08/11/2015  . Incontinence 08/11/2015  . Hypokalemia 08/11/2015  . Protein-calorie malnutrition ([place]) 08/11/2015  . Hemiplegia affecting dominant side ([place])   . Aphasia, post-stroke   . Dysphagia, post-stroke   . Dysphasia, post-stroke   . Hemiplegia, post-stroke ([place])   . Cognitive deficit, post-stroke   . Seizure prophylaxis   . Leukocytosis   . Benign essential HTN   . HLD (hyperlipidemia)   . Lethargy   . Altered mental status   . 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 Brain herniation ([place]) 08/05/2015  . ICH (intracerebral hemorrhage) ([place]) 08/01/2015  . Hepatitis 08/12/2010  . Renal failure 08/12/2010  . 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 CONJUNCTIVITIS, ALLERGIC 09/02/2008  . 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 ALLERGIC RHINITIS 09/02/2008  . BRONCHITIS 09/02/2008    Expected Discharge Date: Expected Discharge Date:  (SNF)  Team Members Present: Physician leading conference: Dr. Alger Simons Social Worker Present: Lennart Pall, LCSW Nurse Present: Heather Roberts, RN PT Present: [name], PT OT Present: Roanna Epley, [place], OT SLP Present: Weston Anna, SLP PPS Coordinator present : Daiva Nakayama, RN, CRRN     Current  Status/Progress Goal Weekly Team Focus  Medical   more alert, initiating more. still not taking in enough on her own. continues on PEG feeds at night  increase functional mobility, maintain ROM/tone control  tone mgt, increase po intake   Bowel/Bladder   continent of bowel, LBM 5/21, incontinent at times of bladder  min assist  timed toileting   Swallow/Nutrition/ Hydration   Dys. 2 textures with thin liquids with full supervision PEG for meds, minimal PO intake  Min A with least restrictive diet  tolerance of current diet, increased use of swallow strategies   ADL's   UB dressing-min A; LB dressing-max A; bathing-mod A; functional transfers-mod A; max verbal cues for attention to right; max verbal cues for task initiaion; some apraxia still present  overall mod A  activity tolerance, task initiation, following one step commands, functional transfers, attention to right   Mobility   mod-max A transfers, min-mod A bed mobility, max A gait using HW and stairs, supervision wheelchair mobility  min-mod A wheelchair level  functional mobility training, R NMR, R attention, standing balance, safety, pt/family education   Communication   Mod A  Mod A  verbal expression of wants/needs, yes/no accuracy, following commands    Safety/Cognition/ Behavioral Observations  Mod-Max A   mod A  attention, problem solving, orientation    Pain   c/o pain to the right hand/arm related to splint last night with relief on Tylenol  pain scale less than 3  continue to assess & treat Q shift & prn   Skin  right hip abrasion is healed, protective dressing in place, bruising to abdomen from heparin injections  no new areas of skin breakdown  continue to assess q shift      *See Care Plan and progress notes for long and short-term goals.  Barriers to Discharge: ongoing cognitive/language deficits, early mood problems/depression    Possible Resolutions to Barriers:  ongoing pt/family education, 24 hour  supervision at discharge    Discharge Planning/Teaching Needs:  DC plan is SNF.  Hope to have bed for end of week.      Team Discussion:  PT plans to practice car transfers with spouse tomorrow per his request.  Still @ mod/ max assist.  Making gains with ST and improved y/n accuracy.  Will need to continue with tube feedings at SNF.  SW reports hopes to have bed for transfer on Friday.  Revisions to Treatment Plan:  None   Continued Need for Acute Rehabilitation Level of Care: The patient requires daily medical management by a physician with specialized training in physical medicine and rehabilitation for the following conditions: Daily direction of a multidisciplinary physical rehabilitation program to ensure safe treatment while eliciting the highest outcome that is of practical value to the patient.: Yes Daily medical management of patient stability for increased activity during participation in an intensive rehabilitation regime.: Yes Daily analysis of laboratory values and/or radiology reports with any subsequent need for medication adjustment of medical intervention for : Neurological problems;Nutritional problems;Mood/behavior problems  Alvis Edgell 09/10/2015, 9:58 AM

## 2015-09-09 NOTE — Progress Notes (Signed)
Occupational Therapy Session Note  Patient Details  Name: [name] MRN: [id number] Date of Birth: 04/27/1940  Today's Date: 09/09/2015 OT Individual Time: 1100-1200 OT Individual Time Calculation (min): 60 min    Short Term Goals: Week 4:  OT Short Term Goal 1 (Week 4): STG=LYG secondary to ELOS  Skilled Therapeutic Interventions/Progress Updates:    Pt engaged in BADL retraining including bathing at shower level and dressing with sit<>stand from w/c at sink.  Pt completed bathing tasks with min A and dressing tasks with mod A.  Pt continues to required max multimodal cues to attend to her RUE/RLE during dressing tasks but bathes her RUE/RLE during bathing tasks.  Pt requires max A for sit<>stand and standing balance to pull up pants.  Pt continues to exhibit impulsive behaviors during BADLs and during transitional movements.    Therapy Documentation Precautions:  Precautions Precautions: Fall Precaution Comments: R hemiplegia, R neglect, nonverbal Restrictions Weight Bearing Restrictions: No   Pain: Pain Assessment Pain Assessment: No/denies pain  See Function Navigator for Current Functional Status.   Therapy/Group: Individual Therapy  [name] 09/09/2015, 12:07 PM

## 2015-09-10 ENCOUNTER — Inpatient Hospital Stay (HOSPITAL_COMMUNITY): Payer: Medicare Other | Admitting: Physical Therapy

## 2015-09-10 ENCOUNTER — Inpatient Hospital Stay (HOSPITAL_COMMUNITY): Payer: Medicare Other

## 2015-09-10 ENCOUNTER — Inpatient Hospital Stay (HOSPITAL_COMMUNITY): Payer: Medicare Other | Admitting: Speech Pathology

## 2015-09-10 LAB — BASIC METABOLIC PANEL
ANION GAP: 11 (ref 5–15)
BUN: 39 mg/dL — ABNORMAL HIGH (ref 6–20)
CALCIUM: 9.5 mg/dL (ref 8.9–10.3)
CO2: 20 mmol/L — ABNORMAL LOW (ref 22–32)
Chloride: 102 mmol/L (ref 101–111)
Creatinine, Ser: 0.69 mg/dL (ref 0.44–1.00)
Glucose, Bld: 116 mg/dL — ABNORMAL HIGH (ref 65–99)
POTASSIUM: 4.2 mmol/L (ref 3.5–5.1)
Sodium: 133 mmol/L — ABNORMAL LOW (ref 135–145)

## 2015-09-10 LAB — CBC
HEMATOCRIT: 36.9 % (ref 36.0–46.0)
Hemoglobin: 12.4 g/dL (ref 12.0–15.0)
MCH: 30.2 pg (ref 26.0–34.0)
MCHC: 33.6 g/dL (ref 30.0–36.0)
MCV: 89.8 fL (ref 78.0–100.0)
PLATELETS: 269 10*3/uL (ref 150–400)
RBC: 4.11 MIL/uL (ref 3.87–5.11)
RDW: 13.9 % (ref 11.5–15.5)
WBC: 10.4 10*3/uL (ref 4.0–10.5)

## 2015-09-10 LAB — GLUCOSE, CAPILLARY: Glucose-Capillary: 109 mg/dL — ABNORMAL HIGH (ref 65–99)

## 2015-09-10 MED ORDER — FREE WATER
300.0000 mL | Freq: Four times a day (QID) | Status: DC
  Administered 2015-09-10 – 2015-09-12 (×6): 300 mL

## 2015-09-10 MED ORDER — MIRTAZAPINE 15 MG PO TABS: 15.0000 mg | ORAL_TABLET | Freq: Every day | ORAL | Status: DC

## 2015-09-10 MED ORDER — MIRTAZAPINE 15 MG PO TABS
15.0000 mg | ORAL_TABLET | Freq: Every day | ORAL | Status: DC
  Administered 2015-09-10 – 2015-09-11 (×2): 15 mg via ORAL
  Filled 2015-09-10 (×2): qty 1

## 2015-09-10 NOTE — Progress Notes (Signed)
[facility]     PROGRESS NOTE    Subjective/Complaints: Up and awake. In no distress. Denies pain.    ROS: remain limited due to cognitive status  Objective: Vital Signs: Blood pressure 133/47, pulse 77, temperature 97.9 F (36.6 C), temperature source Oral, resp. rate 18, weight 64.229 kg (141 lb 9.6 oz), SpO2 99 %. No results found.  Recent Labs  09/10/15 0657  WBC 10.4  HGB 12.4  HCT 36.9  PLT 269    Recent Labs  09/10/15 0657  NA 133*  K 4.2  CL 102  GLUCOSE 116*  BUN 39*  CREATININE 0.69  CALCIUM 9.5   CBG (last 3)  No results for input(s): GLUCAP in the last 72 hours.  Wt Readings from Last 3 Encounters:  09/10/15 64.229 kg (141 lb 9.6 oz)  08/05/15 64.5 kg (142 lb 3.2 oz)  06/14/11 64.501 kg (142 lb 3.2 oz)    Physical Exam:  Constitutional: She appears well-developed, well nourished. NAD. HENT: Normocephalic. Atraumatic.  Cardiovascular: Normal rate and regular rhythm. + systolic Murmur Respiratory: Effort normal and breath sounds normal. No respiratory distress. no wheezes or rales GI: Soft. Bowel sounds are normal. gtube site appears clean without drainage  Neurological: speaks in low volume. Word finding deficits at times. Needs cueing to increase volume.  Alert.  Makes good eye contact .   Follows simple commands DTRs 3+ right upper and right lower extremity   Motor:  Right upper extremity and right lower extremity:  0/5 Left upper and left lower extremity >/3/5 or more (patient not participating in MMT) Flexor tone RUE---2/4 biceps, trace extensor tone RLE Skin: Skin is warm and dry.  Psych:  Calm/seems to be in good spirits  Assessment/Plan: 1. 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 Physiatrist is providing close team supervision and 24 hour management of active  medical problems listed below. Physiatrist and rehab team continue to assess barriers to discharge/monitor patient progress toward functional and medical goals.  Function:  Bathing Bathing position Bathing activity did not occur: N/A Position: Shower  Bathing parts Body parts bathed by patient: Right arm, Chest, Abdomen, Front perineal area, Right upper leg, Left upper leg, Left lower leg Body parts bathed by helper: Left arm, Buttocks, Right lower leg  Bathing assist Assist Level: Touching or steadying assistance(Pt > 75%)      Upper Body Dressing/Undressing Upper body dressing Upper body dressing/undressing activity did not occur: N/A What is the patient wearing?: Pull over shirt/dress     Pull over shirt/dress - Perfomed by patient: Thread/unthread left sleeve, Put head through opening, Pull shirt over trunk Pull over shirt/dress - Perfomed by helper: Thread/unthread right sleeve        Upper body assist Assist Level: Touching or steadying assistance(Pt > 75%)      Lower Body Dressing/Undressing Lower body dressing Lower body dressing/undressing activity did not occur: N/A What is the patient wearing?: Socks, Shoes, Pants     Pants- Performed by patient: Thread/unthread left pants leg Pants- Performed by helper: Thread/unthread right pants leg, Pull pants up/down Non-skid slipper socks- Performed by patient: Don/doff left sock Non-skid slipper socks- Performed by helper: Don/doff right sock Socks - Performed by patient: Don/doff left sock Socks - Performed by helper: Don/doff right sock Shoes - Performed by patient: Don/doff left shoe Shoes - Performed by helper: Fasten right, [employer]  left, Don/doff right shoe       TED Hose - Performed by helper: Don/doff right TED hose, Don/doff left TED hose  Lower body assist Assist for lower body dressing: Touching or steadying assistance (Pt > 75%)      Toileting Toileting Toileting activity did not occur: No continent  bowel/bladder event Toileting steps completed by patient: Performs perineal hygiene Toileting steps completed by helper: Adjust clothing prior to toileting, Adjust clothing after toileting Toileting Assistive Devices: Grab bar or rail  Toileting assist Assist level: Touching or steadying assistance (Pt.75%)   Transfers Chair/bed transfer   Chair/bed transfer method: Squat pivot Chair/bed transfer assist level: Moderate assist (Pt 50 - 74%/lift or lower) Chair/bed transfer assistive device: Armrests Mechanical lift: Maximove   Locomotion Ambulation Ambulation activity did not occur: Safety/medical concerns   Max distance: 30 Assist level: Maximal assist (Pt 25 - 49%)   Wheelchair   Type: Manual Max wheelchair distance: 120 Assist Level: Supervision or verbal cues  Cognition Comprehension Comprehension assist level: Understands basic 50 - 74% of the time/ requires cueing 25 - 49% of the time  Expression Expression assist level: Expresses basis less than 25% of the time/requires cueing >75% of the time.  Social Interaction Social Interaction assist level: Interacts appropriately 75 - 89% of the time - Needs redirection for appropriate language or to initiate interaction.  Problem Solving Problem solving assist level: Solves basic 50 - 74% of the time/requires cueing 25 - 49% of the time  Memory Memory assist level: Recognizes or recalls 25 - 49% of the time/requires cueing 50 - 75% of the time   Medical Problem List and Plan: 1. 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 DVT Prophylaxis/Anticoagulation: Subcutaneous heparin still indicated 3. Pain Management: Hydrocodone as needed 4. Dysphagia. Diet advanced to D2/thins ---eating more but not enough or consistently  -attention/cognitive factors still play a role in intake  -PEG feeds/H20 boluses  -continue megace    -decreased TF to 35cc/hr overnight. If she eats less than 50% of meals then increase to 75%.  5. Neuropsych: This patient is not capable of making decisions on her own behalf. 6. Skin/Wound Care: Routine skin checks,     8. Fluids/Electrolytes/Nutrition: following lytes---follow up labs reviewed today  -potassium normal  -increase H20 Flushes for increased BUN 9. Seizure prophylaxis. Keppra held for somnolence EEG neg, stopped per neuro 10. Leukocytosis. Wbc's down to 10.1  11. Hypertension. Cozaar 100 mg daily. Monitor with increased mobility  -continue current regimen 12. Hyperlipidemia. Lipitor 13. Lethargy post stroke: improved  -continue ritalin at 10mg  bid beyond dc   14. Low grade temp resolved  - asp precautions.   15. Spasticity: PRAFO/WHO RUE RLE  -continue ROM/modalities/taping  -continue baclofen 10mg  TID 16. Loose stool: now soft/formed  -continue fiber  -continue probiotic     LOS (Days) 30 A FACE TO FACE EVALUATION WAS PERFORMED  SWARTZ,ZACHARY T 09/10/2015 10:40 AM

## 2015-09-10 NOTE — Progress Notes (Signed)
Speech Language Pathology Daily Session Notes  Patient Details  Name: [name] MRN: [id number] Date of Birth: [birth year]  Today's Date: 09/10/2015  Session 1: SLP Individual Time: 0800-0900 SLP Individual Time Calculation (min): 60 min   Session 2: SLP Individual Time: 1100-1130 SLP Individual Time Calculation (min): 30 min  Session 3: SLP Individual Time: [id number] SLP Individual Time Calculation (min): 35 min  Short Term Goals: Week 4: SLP Short Term Goal 1 (Week 4): Patient will consume current diet without overt s/s of aspiration with supervision verbal cues for use of swallowing compensatory strategies.  SLP Short Term Goal 2 (Week 4): Patient will demonstrate efficient mastication of Dys. 3 textures with complete oral clearance over 2 consecutive sessions prior to upgrade with Min A verbal cues.  SLP Short Term Goal 3 (Week 4): Patient will answer basic yes/no questions utilizing multimodal communication with 75% accuracy with Mod A multimodal cues.  SLP Short Term Goal 4 (Week 4): Patient will name functional items at the word level with mod  A multimodal cues in 75% of opportunities.  SLP Short Term Goal 5 (Week 4): Pt will complete close-ended sentences with >75% acc mod A.  SLP Short Term Goal 6 (Week 4): Pt will demonstrate reading comprehension at the word level with 75% acc min A.  Skilled Therapeutic Interventions:  Session 1: Skilled treatment session focused on dysphagia and cognitive-linguistic goals. SLP facilitated session by providing set-up assist with breakfast meal of Dys. 2 textures with thin liquids. Patient consumed minimal amount of meal with overt cough X 1 with thin liquids, suspect due to surprise of the tempeture of the liquid (fresh coffee). Patient answered basic yes/no questions with 90% accuracy and complex yes/no questions with 60% accuracy. Patient named functional items with 100% accuracy with Mod A sentence completion and phonemic cues and  completed closed-ended sentences with 90% accuracy and Mod A phonemic cues. Patient also completed responsive naming tasks with 90% accuracy with supervision verbal cues and category exclusion tasks with 100% accuracy. Patient required Max A multimodal cues for convergent naming tasks with 20% accuracy. Patient left supine in bed with all needs within reach. Continue with current plan of care.   Session 2: Skilled treatment session focused on functional communication. Upon arrival, patient was attempting to communicate with her husband and he requested assistance from the clinician. Patient was able to express her complex/abstract thoughts with extra time and Mod-Max A multimodal cues with cues needed to self-monitor and correct verbal perseveration. It was obvious the patient wanted to continue to express her thoughts, therefore, clinician agreed to come back at 12 to complete conversation. Patient left upright in wheelchair with all needs within reach. Continue with current plan of care.  Session 3: Skilled treatment session focused on functional communication and continuation of earlier conversation.  This clinician asked the CSW to assist her due to the nature of the conversation. With extra time and Mod A question cues for clarification, the patient reported, "I want to go home." Extensive time taken to clarify as best as we were able that patient was not referring to her physical home, but was saying she no longer wanted to be here on earth. She was tearful at times and staring very intently at myself and the CSW. Reassured patient that we are hearing her and understand she is feeling very depressed about her situation (she nods "yes"). Attempted to encourage her to continue to work with therapies and to eat as she was showing  improvement. The possibility of adding an anti-depressant for mood support was also discussed and she was agreeable to this.Patient left with CSW and spouse to complete  conversation. Continue with current plan of care.  Function:  Eating Eating   Modified Consistency Diet: Yes Eating Assist Level: Supervision or verbal cues;Set up assist for Eating Assistive Device Comment: Tube feeding at night time Eating Set Up Assist For: Opening containers       Cognition Comprehension Comprehension assist level: Understands basic 50 - 74% of the time/ requires cueing 25 - 49% of the time  Expression   Expression assist level: Expresses basic 50 - 74% of the time/requires cueing 25 - 49% of the time. Needs to repeat parts of sentences.  Social Interaction Social Interaction assist level: Interacts appropriately 75 - 89% of the time - Needs redirection for appropriate language or to initiate interaction.  Problem Solving Problem solving assist level: Solves basic 50 - 74% of the time/requires cueing 25 - 49% of the time  Memory Memory assist level: Recognizes or recalls 50 - 74% of the time/requires cueing 25 - 49% of the time    Pain Pain Assessment Pain Assessment: No/denies pain  Therapy/Group: Individual Therapy  [name] 09/10/2015, 4:37 PM

## 2015-09-10 NOTE — Progress Notes (Signed)
Social Work Patient ID: [name], female   DOB: 1940/06/07, 75 y.o.   MRN: [id number]   Lowella Curb, LCSW Social Worker Signed  Patient Care Conference 09/09/2015  4:18 PM    Expand All Collapse All   Inpatient RehabilitationTeam Conference and Plan of Care Update Date: 09/09/2015   Time: 2:40 PM     Patient Name: [name]       Medical Record Number: [id number]  Date of Birth: 09-Jul-1940 Sex: Female         Room/Bed: 4W14C/4W14C-01 Payor Info: Payor: MEDICARE / Plan: MEDICARE PART A AND B / Product Type: *No Product type* /    Admitting Diagnosis: lt bg hemm   Admit Date/Time:  08/11/2015  2:47 PM Admission Comments: No comment available   Primary Diagnosis:  Basal ganglia hemorrhage (HCC) Principal Problem: Basal ganglia hemorrhage [facility])    Patient Active Problem List     Diagnosis  Date Noted   .  Labile blood pressure     .  Somnolence     .  Left Basal ganglia hemorrhage ([place])  08/11/2015   .  Hypertensive emergency  08/11/2015   .  Incontinence  08/11/2015   .  Hypokalemia  08/11/2015   .  Protein-calorie malnutrition ([place])  08/11/2015   .  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  Aphasia, post-stroke     .  Dysphagia, post-stroke     .  Dysphasia, post-stroke     .  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  Cognitive deficit, post-stroke     .  Seizure prophylaxis     .  Leukocytosis     .  Benign essential HTN     .  HLD (hyperlipidemia)     .  Lethargy     .  Altered mental status     .  Cytotoxic brain edema ([place])  08/05/2015   .  Brain herniation ([place])  08/05/2015   .  ICH (intracerebral hemorrhage) ([place])  08/01/2015   .  Hepatitis  08/12/2010   .  Renal failure  08/12/2010   .  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  CONJUNCTIVITIS, ALLERGIC  09/02/2008   .  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  ALLERGIC RHINITIS  09/02/2008   .  BRONCHITIS  09/02/2008     Expected Discharge Date: Expected Discharge Date:  (SNF)  Team Members  Present: Physician leading conference: Dr. Alger Simons Social Worker Present: Lennart Pall, LCSW Nurse Present: Heather Roberts, RN PT Present: [name], PT OT Present: Roanna Epley, [place], OT SLP Present: Weston Anna, SLP PPS Coordinator present : Daiva Nakayama, RN, CRRN        Current Status/Progress  Goal  Weekly Team Focus   Medical     more alert, initiating more. still not taking in enough on her own. continues on PEG feeds at night  increase functional mobility, maintain ROM/tone control   tone mgt, increase po intake   Bowel/Bladder     continent of bowel, LBM 5/21, incontinent at times of bladder   min assist  timed toileting   Swallow/Nutrition/ Hydration     Dys. 2 textures with thin liquids with full supervision PEG for meds, minimal PO intake  Min A with least restrictive diet  tolerance of current diet, increased use of swallow strategies    ADL's     UB dressing-min A; LB dressing-max A; bathing-mod  A; functional transfers-mod A; max verbal cues for attention to right; max verbal cues for task initiaion; some apraxia still present  overall mod A  activity tolerance, task initiation, following one step commands, functional transfers, attention to right   Mobility     mod-max A transfers, min-mod A bed mobility, max A gait using HW and stairs, supervision wheelchair mobility  min-mod A wheelchair level  functional mobility training, R NMR, R attention, standing balance, safety, pt/family education   Communication     Mod A  Mod A  verbal expression of wants/needs, yes/no accuracy, following commands    Safety/Cognition/ Behavioral Observations    Mod-Max A   mod A  attention, problem solving, orientation    Pain     c/o pain to the right hand/arm related to splint last night with relief on Tylenol  pain scale less than 3  continue to assess & treat Q shift & prn   Skin     right hip abrasion is healed, protective dressing in place, bruising to abdomen  from heparin injections  no new areas of skin breakdown  continue to assess q shift      *See Care Plan and progress notes for long and short-term goals.    Barriers to Discharge:  ongoing cognitive/language deficits, early mood problems/depression     Possible Resolutions to Barriers:   ongoing pt/family education, 24 hour supervision at discharge      Discharge Planning/Teaching Needs:   DC plan is SNF.  Hope to have bed for end of week.        Team Discussion:    PT plans to practice car transfers with spouse tomorrow per his request.  Still @ mod/ max assist.  Making gains with ST and improved y/n accuracy.  Will need to continue with tube feedings at SNF.  SW reports hopes to have bed for transfer on Friday.   Revisions to Treatment Plan:    None    Continued Need for Acute Rehabilitation Level of Care: The patient requires daily medical management by a physician with specialized training in physical medicine and rehabilitation for the following conditions: Daily direction of a multidisciplinary physical rehabilitation program to ensure safe treatment while eliciting the highest outcome that is of practical value to the patient.: Yes Daily medical management of patient stability for increased activity during participation in an intensive rehabilitation regime.: Yes Daily analysis of laboratory values and/or radiology reports with any subsequent need for medication adjustment of medical intervention for : Neurological problems;Nutritional problems;Mood/behavior problems  Sheryl Saintil 09/10/2015, 9:58 AM                 Lowella Curb, LCSW Social Worker Signed  Patient Care Conference 09/03/2015 12:50 PM    Expand All Collapse All   Inpatient RehabilitationTeam Conference and Plan of Care Update Date: 09/02/2015   Time: 2:40 PM     Patient Name: [name]       Medical Record Number: [id number]  Date of Birth: December 21, 1940 Sex: Female         Room/Bed: 4W14C/4W14C-01 Payor  Info: Payor: MEDICARE / Plan: MEDICARE PART A AND B / Product Type: *No Product type* /    Admitting Diagnosis: lt bg hemm   Admit Date/Time:  08/11/2015  2:47 PM Admission Comments: No comment available   Primary Diagnosis:  Basal ganglia hemorrhage (HCC) Principal Problem: Basal ganglia hemorrhage ([place])    Patient Active Problem List     Diagnosis  Date Noted   .  Labile blood pressure     .  Somnolence     .  Left Basal ganglia hemorrhage ([place])  08/11/2015   .  Hypertensive emergency  08/11/2015   .  Incontinence  08/11/2015   .  Hypokalemia  08/11/2015   .  Protein-calorie malnutrition ([place])  08/11/2015   .  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  Aphasia, post-stroke     .  Dysphagia, post-stroke     .  Dysphasia, post-stroke     .  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  Cognitive deficit, post-stroke     .  Seizure prophylaxis     .  Leukocytosis     .  Benign essential HTN     .  HLD (hyperlipidemia)     .  Lethargy     .  Altered mental status     .  Cytotoxic brain edema ([place])  08/05/2015   .  Brain herniation ([place])  08/05/2015   .  ICH (intracerebral hemorrhage) ([place])  08/01/2015   .  Hepatitis  08/12/2010   .  Renal failure  08/12/2010   .  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  CONJUNCTIVITIS, ALLERGIC  09/02/2008   .  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  ALLERGIC RHINITIS  09/02/2008   .  BRONCHITIS  09/02/2008     Expected Discharge Date: Expected Discharge Date:  (SNF)  Team Members Present: Physician leading conference: Dr. Alger Simons Social Worker Present: Lennart Pall, LCSW Nurse Present: Rayetta Pigg, RN PT Present: [name], PT OT Present: Roanna Epley, [place], OT SLP Present: Weston Anna, SLP PPS Coordinator present : Daiva Nakayama, RN, CRRN        Current Status/Progress  Goal  Weekly Team Focus   Medical     feeding tube placed and tolerated. more alert. spasticity issues  improve tone, increase  engagement  tone mgt, nuttion, dysphagia rx   Bowel/Bladder     Continent of bladder with timed toileting, can be incontinent at HS, continent of bowel LBM 5/14  min assist  Timed toilet    Swallow/Nutrition/ Hydration     Dys.2 textures and thin liquids with full supervision fo safety; PEG for meds and supplemtation as needed  Min A with least restrictive diet  tolerance of current diet    ADL's     dressing-mod A UB, max A LB; mod A/max A functional transfers; max verbal cues for task initiation, decreased R attention   overall mod A  activity tolerance, task initiation, following one step commands, sitting balance, functional transfers, attentin to right    Mobility     min-mod A bed mobility, max A transfers, max A gait with +2 WC follow for safety, max A stairs  min-mod A wheelchair level  transfers, ambulation, R NMR, standing balance, safety awareness, R attention, wheelchair propulsion, pt/familyeducation   Communication     Mod A  Mod A  multimodal communication, verbal expression of wants/needs, yes/no accuracy   Safety/Cognition/ Behavioral Observations    Max A  mod A  attention, problem solving, orientation    Pain     Headache or abdominal pain. PRN Vicodin   <3  Assess and treat pain q shift and prn    Skin     Right hip abrasion healed-allevyn in place, brusing to abd from heparin shots  min assist  Assess q shift      *  See Care Plan and progress notes for long and short-term goals.    Barriers to Discharge:  ongoing cognitive/language deficits, spasticity RUE     Possible Resolutions to Barriers:   see prior, ?AFO RLE, continued spasticity rx     Discharge Planning/Teaching Needs:   DC plan is SNF       Team Discussion:    Still addressing bowels;  Spasticity in right arm continues.  Anticipate need for AFO.  Still max assist for any ambulation.  Slow progress but team feel could transition to SNF beginning next week.    Revisions to Treatment Plan:    NA     Continued Need for Acute Rehabilitation Level of Care: The patient requires daily medical management by a physician with specialized training in physical medicine and rehabilitation for the following conditions: Daily direction of a multidisciplinary physical rehabilitation program to ensure safe treatment while eliciting the highest outcome that is of practical value to the patient.: Yes Daily medical management of patient stability for increased activity during participation in an intensive rehabilitation regime.: Yes Daily analysis of laboratory values and/or radiology reports with any subsequent need for medication adjustment of medical intervention for : Neurological problems;Other;Nutritional problems  Ruchel Brandenburger 09/03/2015, 12:51 PM                 Lowella Curb, LCSW Social Worker Signed  Patient Care Conference 08/26/2015  3:33 PM    Expand All Collapse All   Inpatient RehabilitationTeam Conference and Plan of Care Update Date: 08/26/2015   Time: 2:35 PM     Patient Name: VANNAH BATTIE       Medical Record Number: [id number]  Date of Birth: 1941-01-20 Sex: Female         Room/Bed: 4W14C/4W14C-01 Payor Info: Payor: MEDICARE / Plan: MEDICARE PART A AND B / Product Type: *No Product type* /    Admitting Diagnosis: lt bg hemm   Admit Date/Time:  08/11/2015  2:47 PM Admission Comments: No comment available   Primary Diagnosis:  <principal problem not specified> Principal Problem: <principal problem not specified>    Patient Active Problem List     Diagnosis  Date Noted   .  Labile blood pressure     .  Somnolence     .  Left Basal ganglia hemorrhage ([place])  08/11/2015   .  Hypertensive emergency  08/11/2015   .  Incontinence  08/11/2015   .  Hypokalemia  08/11/2015   .  Protein-calorie malnutrition ([place])  08/11/2015   .  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  Aphasia, post-stroke     .  Dysphagia, post-stroke     .  Dysphasia, post-stroke     .  Hemiplegia,  post-stroke ([place])     .  Cognitive deficit, post-stroke     .  Seizure prophylaxis     .  Leukocytosis     .  Benign essential HTN     .  HLD (hyperlipidemia)     .  Lethargy     .  Altered mental status     .  Cytotoxic brain edema ([place])  08/05/2015   .  Brain herniation ([place])  08/05/2015   .  ICH (intracerebral hemorrhage) ([place])  08/01/2015   .  Hepatitis  08/12/2010   .  Renal failure  08/12/2010   .  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  CONJUNCTIVITIS, ALLERGIC  09/02/2008   .  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  ALLERGIC RHINITIS  09/02/2008   .  BRONCHITIS  09/02/2008     Expected Discharge Date: Expected Discharge Date:  (SNF)  Team Members Present: Physician leading conference: Dr. Alger Simons Social Worker Present: Lennart Pall, LCSW Nurse Present: Rayetta Pigg, RN PT Present: [name], PT OT Present: Roanna Epley, [place], OT SLP Present: Weston Anna, SLP PPS Coordinator present : Daiva Nakayama, RN, CRRN        Current Status/Progress  Goal  Weekly Team Focus   Medical     arousal better but still waxes and wanes. not taking in enough to sustain---peg placed yesterday  improve po intake, consistent arousal   see prior, titrate PEG feeds, dc ivf    Bowel/Bladder     Continent of bladder at times; LBM 5/2- not eating well  Min assist  Timed toileting; assess and treat for constipation as needed    Swallow/Nutrition/ Hydration     Dys. 1 textures with honey-thick liquids, Full supervision, PEG placement   Min A with least restrictive diet  tolerance with current diet, trials of upgraded textures and liquids    ADL's     mod/max A overall; fluctuating arousal, mod/max verbal cues for task initiation  overall mod A  activity tolerance, task initiation, following one step commands, sitting balance, functional transfers, attention to right,    Mobility     min-mod A bed mobility, mod-max A transfers, max A gait and stairs wtih +2 for  safety/WC follow  mod A wheelchair level  functional transfers, ambulation, R NMR, sitting > standing balance, command following, wheelchair seating/propulsion, safety, pt/family education   Communication     Max-Total A  Mod A  multimodal communication, naming, yes/no accuracy    Safety/Cognition/ Behavioral Observations    Max A   Min-Mod A  attention, problem solving, attention to right field of enviornment    Pain     C/o pain in abdomen from PEG placement; tylenol given   < 3  Assess and treat for pain q shift and prn   Skin     Right hip abrasion healed; bruising to abdomen from heparin shots; incision to abdomen from PEG insertion  Min assist  Assess skin q shift and prn    Rehab Goals Patient on target to meet rehab goals: Yes *See Care Plan and progress notes for long and short-term goals.    Barriers to Discharge:  inconsistent arousal and po intake      Possible Resolutions to Barriers:   peg placed, continue to encourage normal sleep-wake patter, engage daily     Discharge Planning/Teaching Needs:   D/c plan is for pt to go to SNF either at [facility] or another facility.       Team Discussion:    Peg placed this week.  Eating minimally.  Some improved alertness overall.  Still impulsive.  Is continent when awake - starting timed toileting. Currently mod/ max assist with hemi walker.  D/c plan for SNF.   Revisions to Treatment Plan:    None    Continued Need for Acute Rehabilitation Level of Care: The patient requires daily medical management by a physician with specialized training in physical medicine and rehabilitation for the following conditions: Daily direction of a multidisciplinary physical rehabilitation program to ensure safe treatment while eliciting the highest outcome that is of practical value to the patient.: Yes Daily medical management of patient stability for increased activity during participation in  an intensive rehabilitation regime.: Yes Daily  analysis of laboratory values and/or radiology reports with any subsequent need for medication adjustment of medical intervention for : Mood/behavior problems;Neurological problems;Nutritional problems  Alphonza Tramell 08/26/2015, 3:33 PM                 Lowella Curb, LCSW Social Worker Signed  Patient Care Conference 08/20/2015 10:13 AM    Expand All Collapse All   Inpatient RehabilitationTeam Conference and Plan of Care Update Date: 08/19/2015   Time: 2:30 PM     Patient Name: JOSE KAUBLE       Medical Record Number: [id number]  Date of Birth: April 16, 1941 Sex: Female         Room/Bed: 4W16C/4W16C-01 Payor Info: Payor: MEDICARE / Plan: MEDICARE PART A AND B / Product Type: *No Product type* /    Admitting Diagnosis: lt bg hemm   Admit Date/Time:  08/11/2015  2:47 PM Admission Comments: No comment available   Primary Diagnosis:  <principal problem not specified> Principal Problem: <principal problem not specified>    Patient Active Problem List     Diagnosis  Date Noted   .  Somnolence     .  Left Basal ganglia hemorrhage ([place])  08/11/2015   .  Hypertensive emergency  08/11/2015   .  Incontinence  08/11/2015   .  Hypokalemia  08/11/2015   .  Protein-calorie malnutrition ([place])  08/11/2015   .  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  Aphasia, post-stroke     .  Dysphagia, post-stroke     .  Dysphasia, post-stroke     .  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  Cognitive deficit, post-stroke     .  Seizure prophylaxis     .  Leukocytosis     .  Benign essential HTN     .  HLD (hyperlipidemia)     .  Lethargy     .  Altered mental status     .  Cytotoxic brain edema ([place])  08/05/2015   .  Brain herniation ([place])  08/05/2015   .  ICH (intracerebral hemorrhage) ([place])  08/01/2015   .  Hepatitis  08/12/2010   .  Renal failure  08/12/2010   .  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  CONJUNCTIVITIS, ALLERGIC  09/02/2008   .  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   ALLERGIC RHINITIS  09/02/2008   .  BRONCHITIS  09/02/2008     Expected Discharge Date: Expected Discharge Date: 09/09/15  Team Members Present: Physician leading conference: Dr. Alger Simons Social Worker Present: Lennart Pall, LCSW Nurse Present: Other (comment) Genene Churn, RN) PT Present: [name], PT OT Present: Roanna Epley, [place], OT SLP Present: Weston Anna, SLP PPS Coordinator present : Daiva Nakayama, RN, CRRN        Current Status/Progress  Goal  Weekly Team Focus   Medical     waxingn and waning cognition, NGT TF's---, diarhea---changing formula. ritalin trial  improve arousal and paricipaion  nutrition,  stool consistency, arousal, skin   Bowel/Bladder     incontinent x2 LBM:08/18/2015. loose due to TF   continent   timed toileting, monitor for regular BM    Swallow/Nutrition/ Hydration     Dys. 1 textures with honey-thick liquids, NG tube and IV for support, limited PO intake due to arousal  Min A with least restrictive diet  tolerance of current diet  ADL's     max/tot A overall; decreased arousal   overall mod A  increased arojusal, task initiation, following one step commands, sitting balance, bed mobility   Mobility     max-total A x 2  mod A wheelchair level  arousal, command following, functional transfers, sitting balance, activity tolerance, positioning, family education    Communication     Nonverbal, Total A   Mod A  arousal, multimodal communication, yes/no accuracy, object identification   Safety/Cognition/ Behavioral Observations    Total A   Min-Mod A  arousal, following commands, attention, attention to midline/right field of enviornment    Pain     no signs of pain   remain pain free   utilize faces pain scale.    Skin     MASD, Right Hip Abrasion and generalized bruising   remain free from skin breadown or infection   turn and reposition q2     Rehab Goals Rehab Goals Revised: minimal changes since last week. *See Care  Plan and progress notes for long and short-term goals.    Barriers to Discharge:  see prior     Possible Resolutions to Barriers:   incrase stimulant, continue to engage, normalize day/night      Discharge Planning/Teaching Needs:   Most likely plan is for pt to d/c to a SNF either at [facility] or another facility affiliated with [facility].       Team Discussion:    Increased ritalin today but pt continues with poor arousal majority of the day.  Slightly better this afternoon with txs.  Very inconsistent presentation and MD feels this is to be expected.  Team discussing d/c care needs and concern over what husband's expectations are for longer term recovery.  Education ongoing with spouse and family.   Revisions to Treatment Plan:    None    Continued Need for Acute Rehabilitation Level of Care: The patient requires daily medical management by a physician with specialized training in physical medicine and rehabilitation for the following conditions: Daily direction of a multidisciplinary physical rehabilitation program to ensure safe treatment while eliciting the highest outcome that is of practical value to the patient.: Yes Daily medical management of patient stability for increased activity during participation in an intensive rehabilitation regime.: Yes Daily analysis of laboratory values and/or radiology reports with any subsequent need for medication adjustment of medical intervention for : Neurological problems;Nutritional problems;Urological problems;Mood/behavior problems  Thaddeaus Monica 08/20/2015, 10:13 AM

## 2015-09-10 NOTE — Progress Notes (Signed)
Ortho splint applied to patient's right hand with moderate difficulty. When asked if she has been wearing the splint every night, patient states "kind of" and gestures. Resistance met when attempting to straighten fingers.

## 2015-09-10 NOTE — Progress Notes (Signed)
Social Work Patient ID: [name], female   DOB: 05/10/1940, 74 y.o.   MRN: [id number]   Met with pt and spouse today per request of Courtney Payne, ST who was also present.  Pt verbalizing, "I want to go home."  Extensive time taken to clarify as best as we were able that pt was not referring to her physical home, but was saying she no longer wanted to be here on earth.  She was tearful at times and staring very intently at myself and ST.  Reassured pt that we are hearing her and understand she is feeling very depressed about her situation (she nods "yes").  Attempted to encourage her to continue to work with therapies and to eat as she was showing improvement.  We discussed possibility of adding an anti-depressant for mood support and she is agreeable to this.  Stressed to pt and spouse that we would recommend that she start medication and continue to have support from family but they will need to continue to discuss and validate her wishes.  Also, spoke with spouse about involving pt's primary MD into the discussion if pt continues to express desire to stop tube feedings/ eating.  Spouse reports that pt has continued to insist that she does not want any visitors, however, I encouraged him to reach out to her closest friends, clergy, etc to see if they might be able to assist in some way.    Have confirmed with [facility] that they can admit pt to their facility on Friday 5/26.  PT was able to practice car transfers with husband and son this afternoon which went very well and PT feels they are capable of transfers and to transport her via car.  , , LCSW 

## 2015-09-10 NOTE — Progress Notes (Signed)
Physical Therapy Session Note  Patient Details  Name: [name] MRN: [id number] Date of Birth: Feb 27, 1941  Today's Date: 09/10/2015 PT Individual Time: 1400-1500 PT Individual Time Calculation (min): 60 min   Short Term Goals: Week 4:  PT Short Term Goal 1 (Week 4): = LTGs due to anticipated LOS  Skilled Therapeutic Interventions/Progress Updates:   Session focused on bed mobility with HOB raised with min A and cues to scoot to EOB, donning L sock/shoe with assist for R sock/shoe and tying laces, squat pivot and stand pivot transfers with max A, community wheelchair mobility via L hemi technique with supervision-min A for negotiating over thresholds and obstacle negotiation on R, actual car transfer to patient's sedan with initial demonstration with therapist followed by patient's son assisting patient with max A via stand pivot and max multimodal cues for sequencing and technique, gait training using L hemiwalker x 36 ft + 10 ft with max A to facilitate weight shift L with patient able to initiate swing phase but requiring max A to safely place RLE and tactile cues for R knee stability in stance and mod-max cues for sequencing HW and turning, and NuStep uisng BLE and LUE with RLE attachment to maintain neutral alignment at level 1 x 8 min. Patient left sitting in wheelchair with son and husband present. Discussed with patient, husband, son, and CSW plan to transport patient to SNF via sedan with son providing max assist, all verbalized understanding.     Therapy Documentation Precautions:  Precautions Precautions: Fall Precaution Comments: R hemiplegia, R neglect, nonverbal Restrictions Weight Bearing Restrictions: No Pain: Pain Assessment Pain Assessment: No/denies pain   See Function Navigator for Current Functional Status.   Therapy/Group: Individual Therapy  [name] 09/10/2015, 3:12 PM

## 2015-09-10 NOTE — Progress Notes (Signed)
Occupational Therapy Session Note  Patient Details  Name: [name] MRN: [id number] Date of Birth: 1940/10/20  Today's Date: 09/10/2015 OT Individual Time: 1000-1100 OT Individual Time Calculation (min): 60 min    Short Term Goals: Week 4:  OT Short Term Goal 1 (Week 4): STG=LYG secondary to ELOS  Skilled Therapeutic Interventions/Progress Updates:    Pt resting in bed upon arrival.  Pt engaged in BADL retraining at bed level and seated in w/c at sink.  Pt incontinent of bladder and required tot A to change her brief.  Pt followed one step commands rolling in bed to facilitate changing her brief.  Pt required mod A for supine>sit EOB in preparation for transfer to w/c to completed bathing/dressing tasks at w/c level.  Pt initiated threading her RUE into shirt sleeve this morning but required max verbal cues to thread her RLE into pants first.  Pt continues to exhibit some impulsivity during dressing tasks.  Pt completed grooming tasks seated in w/c at sink.  Pt requires more than a reasonable amount of time to initiate tasks and max multimodal cues for sequencing.  Focus on activity tolerance, sit<>stand, standing balance, functional transfers, and safety awareness to increase independence with BADLs.  Therapy Documentation Precautions:  Precautions Precautions: Fall Precaution Comments: R hemiplegia, R neglect, nonverbal Restrictions Weight Bearing Restrictions: No  Pain:  Pt with no s/s of pain  See Function Navigator for Current Functional Status.   Therapy/Group: Individual Therapy  [name] 09/10/2015, 11:03 AM

## 2015-09-11 ENCOUNTER — Inpatient Hospital Stay (HOSPITAL_COMMUNITY): Payer: Medicare Other

## 2015-09-11 ENCOUNTER — Inpatient Hospital Stay (HOSPITAL_COMMUNITY): Payer: Medicare Other | Admitting: Speech Pathology

## 2015-09-11 ENCOUNTER — Inpatient Hospital Stay (HOSPITAL_COMMUNITY): Payer: Medicare Other | Admitting: Physical Therapy

## 2015-09-11 DIAGNOSIS — F329 Major depressive disorder, single episode, unspecified: Secondary | ICD-10-CM

## 2015-09-11 LAB — BASIC METABOLIC PANEL
ANION GAP: 10 (ref 5–15)
BUN: 39 mg/dL — ABNORMAL HIGH (ref 6–20)
CHLORIDE: 104 mmol/L (ref 101–111)
CO2: 22 mmol/L (ref 22–32)
CREATININE: 0.87 mg/dL (ref 0.44–1.00)
Calcium: 9.6 mg/dL (ref 8.9–10.3)
GFR calc non Af Amer: >60 mL/min (ref >60)
Glucose, Bld: 117 mg/dL — ABNORMAL HIGH (ref 65–99)
Potassium: 4.1 mmol/L (ref 3.5–5.1)
SODIUM: 136 mmol/L (ref 135–145)

## 2015-09-11 MED ORDER — METHYLPHENIDATE HCL 10 MG PO TABS: 10.0000 mg | ORAL_TABLET | Freq: Two times a day (BID) | ORAL | Status: DC

## 2015-09-11 MED ORDER — MIRTAZAPINE 15 MG PO TABS: 15.0000 mg | ORAL_TABLET | Freq: Every day | ORAL | Status: DC

## 2015-09-11 NOTE — Progress Notes (Signed)
Speech Language Pathology Daily Session Note  Patient Details  Name: [name] MRN: [id number] Date of Birth: 06/07/1940  Today's Date: 09/11/2015 SLP Individual Time: [id number] SLP Individual Time Calculation (min): 45 min  Short Term Goals: Week 4: SLP Short Term Goal 1 (Week 4): Patient will consume current diet without overt s/s of aspiration with supervision verbal cues for use of swallowing compensatory strategies.  SLP Short Term Goal 2 (Week 4): Patient will demonstrate efficient mastication of Dys. 3 textures with complete oral clearance over 2 consecutive sessions prior to upgrade with Min A verbal cues.  SLP Short Term Goal 3 (Week 4): Patient will answer basic yes/no questions utilizing multimodal communication with 75% accuracy with Mod A multimodal cues.  SLP Short Term Goal 4 (Week 4): Patient will name functional items at the word level with mod  A multimodal cues in 75% of opportunities.  SLP Short Term Goal 5 (Week 4): Pt will complete close-ended sentences with >75% acc mod A.  SLP Short Term Goal 6 (Week 4): Pt will demonstrate reading comprehension at the word level with 75% acc min A.  Skilled Therapeutic Interventions: Pt agreeable for OOB with speech therapy. Pt initially with limited verbalizations, but participated with self-care with setup assist. Automatic speech tasks counting 90%acc. Matching words and phrases to pictures completed with 90% acc with increased time and min A. Pt demonstrated phonemic paraphasias with speech attempts. Several episodes of attempts to communicate spontaneously, however majority of content was unknown.    Function:  Eating Eating   Modified Consistency Diet: Yes Eating Assist Level: Supervision or verbal cues           Cognition Comprehension Comprehension assist level: Understands basic 50 - 74% of the time/ requires cueing 25 - 49% of the time  Expression   Expression assist level: Expresses basic 25 - 49% of the  time/requires cueing 50 - 75% of the time. Uses single words/gestures.  Social Interaction Social Interaction assist level: Interacts appropriately 50 - 74% of the time - May be physically or verbally inappropriate.  Problem Solving Problem solving assist level: Solves basic 25 - 49% of the time - needs direction more than half the time to initiate, plan or complete simple activities  Memory Memory assist level: Recognizes or recalls 25 - 49% of the time/requires cueing 50 - 75% of the time    Pain Pain Assessment Pain Assessment: Faces Faces Pain Scale: No hurt  Therapy/Group: Individual Therapy  Vinetta Bergamo MA, CCC-SLP 09/11/2015, 12:28 PM

## 2015-09-11 NOTE — Progress Notes (Signed)
Patient finally agreeable to starting nightly tube feed at 2200, following administration of medication. Patient able to tolerate pills crushed in applesauce. Refused all lliquid meds except baclofen. Refused to have ortho splint placed on right hand, despite continued education.

## 2015-09-11 NOTE — Progress Notes (Signed)
Physical Therapy Discharge Summary  Patient Details  Name: [name] MRN: [id number] Date of Birth: [birth year]  Today's Date: 09/11/2015 PT Individual Time: 1400-1500 PT Individual Time Calculation (min): 60 min    Patient has met 11 of 11 long term goals due to improved activity tolerance, improved balance, improved postural control, increased strength, increased range of motion, decreased pain, ability to compensate for deficits, functional use of  right lower extremity, improved attention, improved awareness, improved coordination and improved arousal.  Patient to discharge at a wheelchair level Max Assist.   Patient's care partner unavailable to provide the necessary physical and cognitive assistance at discharge, therefore DC planned to SNF.  Reasons goals not met: NA  Recommendation:  Patient will benefit from ongoing skilled PT services in skilled nursing facility setting to continue to advance safe functional mobility, address ongoing impairments in R hemiplegia, muscle weakness, decreased endurance, impaired timing and sequencing, abnormal tone, unbalanced muscle activation, apraxia, decreased coordination, decreased attention to right, decreased sitting balance, decreased standing balance, decreased postural control, decreased balance strategies and minimize fall risk.  Equipment: No equipment provided  Reasons for discharge: treatment goals met and discharge from hospital  Patient/family agrees with progress made and goals achieved: Yes  PT Discharge Precautions/Restrictions Precautions Precautions: Fall Restrictions Weight Bearing Restrictions: No Pain Pain Assessment Pain Assessment: No/denies pain Faces Pain Scale: No hurt Vision/Perception  Vision - Assessment Additional Comments: language dificits continue to make it difficulty to full assess.  Pt continues to exhibit decreased scanning to R during functional tasks  Cognition Overall Cognitive Status:  Impaired/Different from baseline Orientation Level: Oriented to person;Oriented to place;Oriented to situation Attention: Sustained Focused Attention: Impaired Sustained Attention: Impaired Awareness: Impaired Awareness Impairment: Intellectual impairment Problem Solving: Impaired Behaviors: Restless Safety/Judgment: Impaired Sensation Sensation Light Touch: Impaired Detail Light Touch Impaired Details: Impaired RUE;Impaired RLE Stereognosis: Not tested Hot/Cold: Not tested Proprioception: Impaired Detail Proprioception Impaired Details: Impaired RUE;Impaired RLE Coordination Gross Motor Movements are Fluid and Coordinated: No Fine Motor Movements are Fluid and Coordinated: No Coordination and Movement Description: R hemiplegia Motor  Motor Motor: Hemiplegia;Motor apraxia  Mobility Bed Mobility Supine to Sit: HOB elevated;5: Supervision Sit to Supine: 4: Min assist Transfers Transfers: Yes Squat Pivot Transfers: 2: Max assist;With upper extremity assistance Locomotion  Ambulation Ambulation: Yes Ambulation/Gait Assistance: 2: Max assist Ambulation Distance (Feet): 35 Feet Assistive device: Hemi-walker Gait Gait: Yes Gait Pattern: Impaired Gait Pattern: Step-through pattern;Decreased step length - left;Decreased stance time - right;Decreased stride length;Decreased dorsiflexion - right;Decreased weight shift to left;Right circumduction;Right flexed knee in stance;Lateral hip instability;Lateral trunk lean to right;Decreased trunk rotation;Trunk flexed;Poor foot clearance - right Gait velocity: decreased Stairs / Additional Locomotion Stairs: Yes Stairs Assistance: 2: Max assist Stair Management Technique: One rail Left;Step to pattern;Forwards Number of Stairs: 12 Height of Stairs: 3 (8 3", 4 6") Wheelchair Mobility Wheelchair Mobility: Yes Wheelchair Assistance: 5: Supervision Wheelchair Propulsion: Left upper extremity;Left lower extremity Wheelchair Parts  Management: Needs assistance  Trunk/Postural Assessment  Cervical Assessment Cervical Assessment: Exceptions to [facility] (forward head, liited AROM) Thoracic Assessment Thoracic Assessment: Exceptions to [facility] (forward flexion) Lumbar Assessment Lumbar Assessment: Exceptions to [facility] (posterior pelvic tilt)  Balance Balance Balance Assessed: Yes Static Sitting Balance Static Sitting - Balance Support: Feet supported;Left upper extremity supported Static Sitting - Level of Assistance: 5: Stand by assistance Dynamic Sitting Balance Sitting balance - Comments: min A Static Standing Balance Static Standing - Balance Support: During functional activity;Left upper extremity supported Static Standing - Level of Assistance:  2: Max assist Dynamic Standing Balance Dynamic Standing - Balance Support: During functional activity;Left upper extremity supported Dynamic Standing - Level of Assistance: 2: Max assist Extremity Assessment  RUE Assessment RUE Assessment: Exceptions to [facility] RUE Strength RUE Overall Strength: Deficits (0/5 throughout) RUE Tone RUE Tone: Moderate LUE Assessment LUE Assessment: Within Functional Limits RLE Assessment RLE Assessment: Exceptions to [facility] RLE Strength RLE Overall Strength: Deficits RLE Overall Strength Comments: unable to fomally assess, patient able to advance RLE during ambulation but no activation noted on command LLE Assessment LLE Assessment: Within Functional Limits   See Function Navigator for Current Functional Status.  [name] A 09/11/2015, 3:03 PM

## 2015-09-11 NOTE — Discharge Summary (Signed)
Discharge summary job [phone]

## 2015-09-11 NOTE — Plan of Care (Signed)
Problem: RH Toilet Transfers Goal: LTG Patient will perform toilet transfers w/assist (OT) LTG: Patient will perform toilet transfers with assist, with/without cues using equipment (OT)  Outcome: Not Met (add Reason) Max A

## 2015-09-11 NOTE — Progress Notes (Signed)
Speech Language Pathology Daily Session Note  Patient Details  Name: [name] MRN: [id number] Date of Birth: 10/14/1940  Today's Date: 09/11/2015 SLP Individual Time: 1000-1030 SLP Individual Time Calculation (min): 30 min  Short Term Goals: Week 4: SLP Short Term Goal 1 (Week 4): Patient will consume current diet without overt s/s of aspiration with supervision verbal cues for use of swallowing compensatory strategies.  SLP Short Term Goal 2 (Week 4): Patient will demonstrate efficient mastication of Dys. 3 textures with complete oral clearance over 2 consecutive sessions prior to upgrade with Min A verbal cues.  SLP Short Term Goal 3 (Week 4): Patient will answer basic yes/no questions utilizing multimodal communication with 75% accuracy with Mod A multimodal cues.  SLP Short Term Goal 4 (Week 4): Patient will name functional items at the word level with mod  A multimodal cues in 75% of opportunities.  SLP Short Term Goal 5 (Week 4): Pt will complete close-ended sentences with >75% acc mod A.  SLP Short Term Goal 6 (Week 4): Pt will demonstrate reading comprehension at the word level with 75% acc min A.  Skilled Therapeutic Interventions: Skilled treatment session focused on dysphagia and cognition goals. SLP facilitated session by providing supervision during consumption of thin liquids. Pt consumed without overt s/s of aspiration/dysphagia. Pt named functional items in 4/10 items with perseveration of initial object for remainder of trials. Pt able to be redirection from preseverations with sentence completion cues for 5/10 and required phonemic cues for 1/10 opportunities. Pt unable to answer basic yes/no question with max multimodal cues. Pt able to read single words with 22/25 words. Phonemic cues helpful for remaining 3 words. Pt left in bed with alarms in place and all needs within reach. Continue current plan of care.   Function:  Eating Eating   Modified Consistency Diet:  Yes Eating Assist Level: Supervision or verbal cues           Cognition Comprehension Comprehension assist level: Understands basic 25 - 49% of the time/ requires cueing 50 - 75% of the time  Expression   Expression assist level: Expresses basic 25 - 49% of the time/requires cueing 50 - 75% of the time. Uses single words/gestures.  Social Interaction Social Interaction assist level: Interacts appropriately 25 - 49% of time - Needs frequent redirection.  Problem Solving Problem solving assist level: Solves basic less than 25% of the time - needs direction nearly all the time or does not effectively solve problems and may need a restraint for safety  Memory Memory assist level: Recognizes or recalls less than 25% of the time/requires cueing greater than 75% of the time    Pain    Therapy/Group: Individual Therapy  [name] 09/11/2015, 12:14 PM

## 2015-09-11 NOTE — Progress Notes (Signed)
Occupational Therapy Discharge Summary  Patient Details  Name: [name] MRN: [id number] Date of Birth: 1941/01/02  Patient has met 8 of 11 long term goals due to improved activity tolerance, improved balance, postural control, ability to compensate for deficits, improved attention and improved awareness.  Pt progress with BADLs was inconsistent during this admission.  Pt continues to require max verbal cues for bathing tasks and attention to her right.  Pt requires max multimodal cues for dressing tasks secondary to pt's inattention to her right side of body.  Pt requires max multimodal cues for sequencing and safety during transitional movements.  [name] RUE is flaccid with the beginnings of tone in hand and biceps.  Pt to discharge to SNF for continued rehabilitation. Patient to discharge at [facility] Max Assist level.  Patient's care partner unavailable to provide the necessary physical and cognitive assistance at discharge.    Reasons goals not met: Pt did not meet LB dressing goal of mod A (she needs max A), toilet transfers of mod A (she needs max A), and using RUE as a stabilizer (it continues to be flaccid with R inattention).  Recommendation:  Patient will benefit from ongoing skilled OT services in skilled nursing facility setting to continue to advance functional skills in the area of BADL and Reduce care partner burden.  Equipment: No equipment provided  Reasons for discharge: treatment goals met and discharge from hospital  Patient/family agrees with progress made and goals achieved: Yes  OT Discharge Pain Assessment Pain Assessment: No/denies pain Faces Pain Scale: No hurt Vision/Perception  Vision- Assessment Vision Assessment?: Vision impaired- to be further tested in functional context Additional Comments: language dificits continue to make it difficulty to full assess.  Pt continues to exhibit decreased scanning to R during functional tasks  Cognition Overall  Cognitive Status: Impaired/Different from baseline Orientation Level: Oriented to person;Oriented to place;Oriented to situation Attention: Sustained Focused Attention: Impaired Sustained Attention: Impaired Awareness: Impaired Awareness Impairment: Intellectual impairment Problem Solving: Impaired Behaviors: Restless Safety/Judgment: Impaired Sensation Sensation Light Touch: Impaired Detail Light Touch Impaired Details: Impaired RUE;Impaired RLE Stereognosis: Not tested Hot/Cold: Appears Intact Proprioception: Impaired Detail Proprioception Impaired Details: Impaired RUE;Impaired RLE Coordination Gross Motor Movements are Fluid and Coordinated: No Fine Motor Movements are Fluid and Coordinated: No Coordination and Movement Description: R hemiplegia Motor  Motor Motor: Hemiplegia;Motor apraxia Mobility  Bed Mobility Supine to Sit: HOB elevated;5: Supervision Sit to Supine: 4: Min assist  Trunk/Postural Assessment  Cervical Assessment Cervical Assessment: Exceptions to [facility] (forward head, liited AROM) Thoracic Assessment Thoracic Assessment: Exceptions to [facility] (forward flexion) Lumbar Assessment Lumbar Assessment: Exceptions to [facility] (posterior pelvic tilt)  Balance Static Sitting Balance Static Sitting - Balance Support: Feet supported;Left upper extremity supported Static Sitting - Level of Assistance: 5: Stand by assistance Dynamic Sitting Balance Sitting balance - Comments: min A Extremity/Trunk Assessment RUE Assessment RUE Assessment: Exceptions to WFL (0/5, flaccid) LUE Assessment LUE Assessment: Within Functional Limits   See Function Navigator for Current Functional Status.  Leotis Shames [facility] 09/11/2015, 3:25 PM

## 2015-09-11 NOTE — Plan of Care (Signed)
Problem: RH Dressing Goal: LTG Patient will perform lower body dressing w/assist (OT) LTG: Patient will perform lower body dressing with assist, with/without cues in positioning using equipment (OT)  Outcome: Not Met (add Reason) Max A for LB dressing

## 2015-09-11 NOTE — Progress Notes (Signed)
Patient rested well throughout the night. Tolerated tube feeding per MD orders without difficulty. Meds given crushed in applesauce per patient request. Tolerated well. No coughing noted. Able to tolerate ortho splint to right hand throughout shift. No acute distress noted.

## 2015-09-11 NOTE — Progress Notes (Addendum)
[place] PHYSICAL MEDICINE & REHABILITATION     PROGRESS NOTE    Subjective/Complaints: Slept fairly well. No new issues. Mood discussed yesterday with therapist/social worker/husband----the consensus is that she is depressed---this is part of the reason she's not eating    ROS: remain limited due to cognitive status  Objective: Vital Signs: Blood pressure 122/56, pulse 79, temperature 98.3 F (36.8 C), temperature source Oral, resp. rate 18, weight 63.982 kg (141 lb 0.9 oz), SpO2 100 %. No results found.  Recent Labs  09/10/15 0657  WBC 10.4  HGB 12.4  HCT 36.9  PLT 269    Recent Labs  09/10/15 0657 09/11/15 0545  NA 133* 136  K 4.2 4.1  CL 102 104  GLUCOSE 116* 117*  BUN 39* 39*  CREATININE 0.69 0.87  CALCIUM 9.5 9.6   CBG (last 3)   Recent Labs  09/10/15 1134  GLUCAP 109*    Wt Readings from Last 3 Encounters:  09/11/15 63.982 kg (141 lb 0.9 oz)  08/05/15 64.5 kg (142 lb 3.2 oz)  06/14/11 64.501 kg (142 lb 3.2 oz)    Physical Exam:  Constitutional: She appears well-developed, well nourished. NAD. HENT: Normocephalic. Atraumatic.  Cardiovascular: Normal rate and regular rhythm. + systolic Murmur Respiratory: Effort normal and breath sounds normal. No respiratory distress. no wheezes or rales GI: Soft. Bowel sounds are normal. gtube site appears clean without drainage  Neurological: speaks in low volume with word finding deficits at times. Needs cueing to increase volume.  Alert.   Follows simple commands DTRs 3+ right upper and right lower extremity   Motor:  Right upper extremity and right lower extremity:  0/5 Left upper and left lower extremity >/3/5 or more (patient not participating in MMT) Flexor tone RUE---2/4 biceps, trace extensor tone RLE Skin: Skin is warm and dry.  Psych:  Calm/seems to be in good spirits  Assessment/Plan: 1. 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 Physiatrist is providing close team supervision and 24 hour management of active medical problems listed below. Physiatrist and rehab team continue to assess barriers to discharge/monitor patient progress toward functional and medical goals.  Function:  Bathing Bathing position Bathing activity did not occur: N/A Position: Shower  Bathing parts Body parts bathed by patient: Right arm, Chest, Abdomen, Front perineal area, Right upper leg, Left upper leg, Left lower leg Body parts bathed by helper: Left arm, Buttocks, Right lower leg  Bathing assist Assist Level: Touching or steadying assistance(Pt > 75%)      Upper Body Dressing/Undressing Upper body dressing Upper body dressing/undressing activity did not occur: N/A What is the patient wearing?: Pull over shirt/dress     Pull over shirt/dress - Perfomed by patient: Thread/unthread left sleeve, Put head through opening, Pull shirt over trunk Pull over shirt/dress - Perfomed by helper: Thread/unthread right sleeve        Upper body assist Assist Level: Touching or steadying assistance(Pt > 75%)      Lower Body Dressing/Undressing Lower body dressing Lower body dressing/undressing activity did not occur: N/A What is the patient wearing?: Socks, Shoes, Pants     Pants- Performed by patient: Thread/unthread left pants leg Pants- Performed by helper: Thread/unthread right pants leg, Pull pants up/down Non-skid slipper socks- Performed by patient: Don/doff left sock Non-skid slipper socks- Performed by helper: Don/doff right sock Socks - Performed by patient: Don/doff left sock Socks -  Performed by helper: Don/doff right sock Shoes - Performed by patient: Don/doff left shoe Shoes - Performed by helper: Fasten right, Fasten left, Don/doff right shoe       TED Hose - Performed by helper: Don/doff right TED hose, Don/doff left TED hose  Lower body assist  Assist for lower body dressing: Touching or steadying assistance (Pt > 75%)      Toileting Toileting Toileting activity did not occur: No continent bowel/bladder event Toileting steps completed by patient: Performs perineal hygiene Toileting steps completed by helper: Adjust clothing prior to toileting, Adjust clothing after toileting Toileting Assistive Devices: Grab bar or rail  Toileting assist Assist level: Two helpers   Transfers Chair/bed transfer   Chair/bed transfer method: Stand pivot Chair/bed transfer assist level: Maximal assist (Pt 25 - 49%/lift and lower) Chair/bed transfer assistive device: Armrests Mechanical lift: Maximove   Locomotion Ambulation Ambulation activity did not occur: Safety/medical concerns   Max distance: 36 Assist level: Maximal assist (Pt 25 - 49%)   Wheelchair   Type: Manual Max wheelchair distance: 200 Assist Level: Touching or steadying assistance (Pt > 75%)  Cognition Comprehension Comprehension assist level: Understands basic 50 - 74% of the time/ requires cueing 25 - 49% of the time  Expression Expression assist level: Expresses basic 50 - 74% of the time/requires cueing 25 - 49% of the time. Needs to repeat parts of sentences.  Social Interaction Social Interaction assist level: Interacts appropriately 50 - 74% of the time - May be physically or verbally inappropriate.  Problem Solving Problem solving assist level: Solves basic 50 - 74% of the time/requires cueing 25 - 49% of the time  Memory Memory assist level: Recognizes or recalls 50 - 74% of the time/requires cueing 25 - 49% of the time   Medical Problem List and Plan: 1. 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 DVT Prophylaxis/Anticoagulation: Subcutaneous heparin still indicated 3. Pain Management: Hydrocodone as needed 4. Dysphagia. Diet advanced to D2/thins  ---eating more but not enough or consistently  -attention/cognitive factors still play a role in intake  -PEG feeds/H20 boluses  -continue megace   -decreased TF to 35cc/hr overnight. If she eats less than 50% of meals then increase to 75%.  5. Neuropsych: This patient is not capable of making decisions on her own behalf.  -reactive depression---trial of remeron 15mg  qhs (may help with appetite too) 6. Skin/Wound Care: Routine skin checks,     8. Fluids/Electrolytes/Nutrition: following lytes---follow up labs reviewed today--BUN holding at 39  -potassium normal  -increased H20 Flushes on 5/24 for increased BUN 9. Seizure prophylaxis. Keppra held for somnolence EEG neg, stopped per neuro 10. Leukocytosis. Wbc's down to 10.1  11. Hypertension. Cozaar 100 mg daily. Monitor with increased mobility  -continue current regimen 12. Hyperlipidemia. Lipitor 13. Lethargy post stroke: improved  -continue ritalin at 10mg  bid beyond dc   14. Low grade temp resolved  - asp precautions.   15. Spasticity: PRAFO/WHO RUE RLE  -continue ROM/modalities/taping  -continue baclofen 10mg  TID 16. Loose stool: now soft/formed  -continue fiber  -continue probiotic     LOS (Days) 31 A FACE TO FACE EVALUATION WAS PERFORMED  Kira Hartl T 09/11/2015 7:56 AM

## 2015-09-11 NOTE — Progress Notes (Signed)
Occupational Therapy Session Note  Patient Details  Name: [name] MRN: [id number] Date of Birth: 03/19/1941  Today's Date: 09/11/2015 OT Individual Time: 1100-1200 OT Individual Time Calculation (min): 60 min    Short Term Goals: Week 4:  OT Short Term Goal 1 (Week 4): STG=LYG secondary to ELOS  Skilled Therapeutic Interventions/Progress Updates:    Pt engaged in BADL retraining including bathing at shower level and dressing with sit<>stand from w/c at sink.  Pt noted with increased impulsivity during this session and required max verbal cues for safety awareness and sequencing during transfers.  Pt required increased assistance for standing balance secondary to pt's decreased awareness while standing.  Focus on activity tolerance, functional transfers, standing balance, task initiation, sequencing, and safety awareness to decrease burden of care and increase independence with BADLs.   Therapy Documentation Precautions:  Precautions Precautions: Fall Precaution Comments: R hemiplegia, R neglect, nonverbal Restrictions Weight Bearing Restrictions: No  Pain:  Pt denied pain  See Function Navigator for Current Functional Status.   Therapy/Group: Individual Therapy  [name] 09/11/2015, 12:09 PM

## 2015-09-11 NOTE — Plan of Care (Signed)
Problem: RH Functional Use of Upper Extremity Goal: LTG Patient will use RT/LT upper extremity as a (OT) LTG: Patient will use right/left upper extremity as a stabilizer/gross assist/diminished/nondominant/dominant level with assist, with/without cues during functional activity (OT)  Outcome: Not Met (add Reason) Inattention to RUE, RUE flaccid

## 2015-09-11 NOTE — Discharge Summary (Signed)
[name], [name] NO.:  [id number]  MEDICAL RECORD NO.:  [id number]  LOCATION:  [id number]                        FACILITY:  [facility]  PHYSICIAN:  [name], M.D.DATE OF BIRTH:  1940/09/24  DATE OF ADMISSION:  08/11/2015 DATE OF DISCHARGE:  09/12/2015                              DISCHARGE SUMMARY   DISCHARGE DIAGNOSES: 1. Right hemiplegia, aphasia, dysphagia secondary to left basal     ganglia hypertensive intracranial hemorrhage. 2. Subcutaneous heparin for deep vein thrombosis prophylaxis. 3. Pain management. 4. Dysphagia status post gastrostomy tube on Aug 25, 2015, per     Interventional Radiology. 5. Reactive depression. 6. Seizure prophylaxis. 7. Hypertension. 8. Hyperlipidemia.  HISTORY OF PRESENT ILLNESS:  This is a 75 year old right-handed female with history of hypertension who presented on August 01, 2015, with acute onset of aphasia and right-sided weakness.  She lives with spouse independent prior to admission.  Blood pressure 120/106 on admission. CT of the head and imaging revealed large left basal ganglia hemorrhage with no extension into the left lateral ventricle.  MRI of the brain showed increased size of acute intraparenchymal hemorrhage centered at the with left basal ganglia measuring 5.6 x 3.3 x 3.6 cm.  Associated localized vasogenic edema and mass effect.  No hydrocephalus.  MRA of the head with no vascular abnormality.  No large proximal arterial branch occlusion.  Neurology follow up.  Conservative care.  Close monitoring of blood pressure.  Followup CT of the head on August 08, 2015, due to increase somnolence showed left hemisphere hyperdense intra- axial hemorrhage, stable.  There was some surrounding edema and regional mass effect that was also stable.  Mild rightward midline shift of 4 mm. No new intracranial abnormality.  EEG on August 09, 2015, showed mild focal slowing in the left temporal region with a rare intermittent  left frontotemporal abnormal epileptiform discharges.  She was placed on Keppra for on August 09, 2015.  Currently on a dysphagia #2 honey thick liquid diet.  Subcutaneous heparin for DVT prophylaxis initiated on August 09, 2015.  Bouts of agitation, Seroquel was added, discontinued secondary to somnolence.  The patient was admitted for a comprehensive rehab program.  PAST MEDICAL HISTORY:  See discharge diagnoses.  SOCIAL HISTORY:  Independent prior to admission, living with spouse. Functional status upon admission to rehab services was moderate assist stand pivot transfers, max assist squat pivot transfers, max to total assist for activities of daily living.  PHYSICAL EXAMINATION:  VITAL SIGNS:  Blood pressure 148/69, pulse 66, temperature 98, respirations 20. GENERAL:  This was a somewhat lethargic female, but arousable.  She made eye contact with examiner. LUNGS:  Clear to auscultation without wheeze. CARDIAC:  Regular rate and rhythm without murmur. ABDOMEN:  Soft, nontender.  Good bowel sounds.  REHABILITATION HOSPITAL COURSE:  The patient was admitted to inpatient rehab services with therapies initiated on a 3-hour daily basis consisting of physical therapy, occupational therapy, speech therapy, and rehabilitation nursing.  The following issues were addressed during the patient's rehabilitation stay.  Pertaining to [name]'s left basal ganglia hypertensive intracranial hemorrhage remained stable. Followup cranial CT scan showed to be stable.  No new abnormality.  She would follow up  with Neurology Services.  She remained on subcutaneous heparin for DVT prophylaxis throughout her hospital course from August 09, 2015, no bleeding episodes.  A gastrostomy tube was placed on Aug 25, 2015, for nutritional support.  Her diet was slowly advanced to a dysphagia #2 thin liquid diet.  Tube feeds decreased to 35 mL an hour at night, if she eats less than 50% of meals than increased to  75%. Ritalin had been added to help the patient's focus to task with relatively good results.  Remeron was added for reactive depression related to her CVA, this was discussed with family.  The patient received weekly collaborative interdisciplinary team conferences to discuss estimated length of stay, family teaching, any barriers to her discharge.  Focused on bed mobility with head of bed raised with minimal assistance and cues.  Donning socks shoes with assistance for right sock and shoe, max assist.  Community wheelchair mobility with hemi left technique, supervision minimal assistance.  Needed some assistance for navigating around obstacles.  Car transfers to patient's sedan with initial demonstration with therapies followed by patient's family assisting with the patient max assist.  Ambulating with a left hemi walker at 36 feet with max assist to facilitate weight shift on the left.  Activities of daily living and homemaking, the patient engaged in bathing, dressing, retraining at bed level, and seeded in wheelchair at sink.  Followed 1 step commands, rolling in bed to facilitate changing her brief.  Required moderate assist for supine to sit edge of bed and preparation for transfers to wheelchair.  She received close followup by Speech Therapy for dysphagia as well as Aphasia.  Focused on functional communication.  She would attempt to communicate mostly with her husband.  She was able to express her complex abstract thoughts with extra time and moderate to max assist cues.  Ongoing family teaching was completed, felt skilled nursing facility was needed with bed becoming available on Sep 12, 2015.  DISCHARGE MEDICATIONS: 1. RisaQuad 1 capsule p.o. b.i.d. 2. Lipitor 20 mg p.o. at bedtime. 3. Baclofen  10 mg po  3 times daily. 4. Flonase 1 spray to each nostril daily as needed. 5. Xalatan ophthalmic solution 0.005% 1 drop to both eyes at bedtime. 6. Claritin 10 mg p.o. daily. 7.  Cozaar 100 mg p.o. daily. 8. Ritalin 10 mg p.o. b.i.d. breakfast and lunch. 9. Remeron 50 mg p.o. at bedtime. 10.Patanol ophthalmic solution 1 drop to both eyes twice daily as     needed. 11.Protonix 40 mg at bedtime.  DIET:  Her diet was a dysphagia #2 thin liquids with meds crushed in applesauce as well as Jevity tube feeds 7 p.m. to 7 a.m. at 35 mL an hour, free water 300 mL every 6 hours by tube.  FOLLOWUP:  The patient would follow up outpatient with Dr. Alger Simons at the outpatient rehab service office as directed; Dr. Erlinda Hong, Neurology Services in 1 month, call for appointment; Dr. Crist Infante, medical management.     Lauraine Rinne, P.A.   ______________________________ [name], M.D.    DA/MEDQ  D:  09/11/2015  T:  09/11/2015  Job:  [id number]  cc:   Elta Guadeloupe A. Perini, M.D.

## 2015-09-12 DIAGNOSIS — R130 Aphagia: Secondary | ICD-10-CM | POA: Diagnosis not present

## 2015-09-12 DIAGNOSIS — G936 Cerebral edema: Secondary | ICD-10-CM | POA: Diagnosis not present

## 2015-09-12 DIAGNOSIS — I69151 Hemiplegia and hemiparesis following nontraumatic intracerebral hemorrhage affecting right dominant side: Secondary | ICD-10-CM | POA: Diagnosis not present

## 2015-09-12 DIAGNOSIS — F329 Major depressive disorder, single episode, unspecified: Secondary | ICD-10-CM | POA: Diagnosis not present

## 2015-09-12 DIAGNOSIS — R5383 Other fatigue: Secondary | ICD-10-CM | POA: Diagnosis not present

## 2015-09-12 DIAGNOSIS — I69220 Aphasia following other nontraumatic intracranial hemorrhage: Secondary | ICD-10-CM | POA: Diagnosis not present

## 2015-09-12 DIAGNOSIS — I69191 Dysphagia following nontraumatic intracerebral hemorrhage: Secondary | ICD-10-CM | POA: Diagnosis not present

## 2015-09-12 DIAGNOSIS — R400 Somnolence: Secondary | ICD-10-CM | POA: Diagnosis not present

## 2015-09-12 DIAGNOSIS — I69320 Aphasia following cerebral infarction: Secondary | ICD-10-CM | POA: Diagnosis not present

## 2015-09-12 DIAGNOSIS — Z431 Encounter for attention to gastrostomy: Secondary | ICD-10-CM | POA: Diagnosis not present

## 2015-09-12 DIAGNOSIS — J309 Allergic rhinitis, unspecified: Secondary | ICD-10-CM | POA: Diagnosis not present

## 2015-09-12 DIAGNOSIS — I10 Essential (primary) hypertension: Secondary | ICD-10-CM | POA: Diagnosis not present

## 2015-09-12 DIAGNOSIS — R252 Cramp and spasm: Secondary | ICD-10-CM | POA: Diagnosis not present

## 2015-09-12 DIAGNOSIS — Z5189 Encounter for other specified aftercare: Secondary | ICD-10-CM | POA: Diagnosis present

## 2015-09-12 DIAGNOSIS — I69391 Dysphagia following cerebral infarction: Secondary | ICD-10-CM | POA: Diagnosis not present

## 2015-09-12 DIAGNOSIS — R1310 Dysphagia, unspecified: Secondary | ICD-10-CM | POA: Diagnosis not present

## 2015-09-12 DIAGNOSIS — E46 Unspecified protein-calorie malnutrition: Secondary | ICD-10-CM | POA: Diagnosis not present

## 2015-09-12 DIAGNOSIS — K5790 Diverticulosis of intestine, part unspecified, without perforation or abscess without bleeding: Secondary | ICD-10-CM | POA: Diagnosis not present

## 2015-09-12 DIAGNOSIS — I69119 Unspecified symptoms and signs involving cognitive functions following nontraumatic intracerebral hemorrhage: Secondary | ICD-10-CM | POA: Diagnosis not present

## 2015-09-12 DIAGNOSIS — G8190 Hemiplegia, unspecified affecting unspecified side: Secondary | ICD-10-CM | POA: Diagnosis not present

## 2015-09-12 DIAGNOSIS — R41841 Cognitive communication deficit: Secondary | ICD-10-CM | POA: Diagnosis not present

## 2015-09-12 DIAGNOSIS — I69319 Unspecified symptoms and signs involving cognitive functions following cerebral infarction: Secondary | ICD-10-CM | POA: Diagnosis not present

## 2015-09-12 DIAGNOSIS — G4700 Insomnia, unspecified: Secondary | ICD-10-CM | POA: Diagnosis not present

## 2015-09-12 DIAGNOSIS — M6281 Muscle weakness (generalized): Secondary | ICD-10-CM | POA: Diagnosis not present

## 2015-09-12 DIAGNOSIS — R2689 Other abnormalities of gait and mobility: Secondary | ICD-10-CM | POA: Diagnosis not present

## 2015-09-12 DIAGNOSIS — R200 Anesthesia of skin: Secondary | ICD-10-CM | POA: Diagnosis not present

## 2015-09-12 DIAGNOSIS — G8191 Hemiplegia, unspecified affecting right dominant side: Secondary | ICD-10-CM | POA: Diagnosis not present

## 2015-09-12 DIAGNOSIS — R1312 Dysphagia, oropharyngeal phase: Secondary | ICD-10-CM | POA: Diagnosis not present

## 2015-09-12 DIAGNOSIS — I69321 Dysphasia following cerebral infarction: Secondary | ICD-10-CM | POA: Diagnosis not present

## 2015-09-12 DIAGNOSIS — E785 Hyperlipidemia, unspecified: Secondary | ICD-10-CM | POA: Diagnosis not present

## 2015-09-12 DIAGNOSIS — R262 Difficulty in walking, not elsewhere classified: Secondary | ICD-10-CM | POA: Diagnosis not present

## 2015-09-12 DIAGNOSIS — R278 Other lack of coordination: Secondary | ICD-10-CM | POA: Diagnosis not present

## 2015-09-12 DIAGNOSIS — I69120 Aphasia following nontraumatic intracerebral hemorrhage: Secondary | ICD-10-CM | POA: Diagnosis not present

## 2015-09-12 DIAGNOSIS — R531 Weakness: Secondary | ICD-10-CM | POA: Diagnosis not present

## 2015-09-12 DIAGNOSIS — Z781 Physical restraint status: Secondary | ICD-10-CM | POA: Diagnosis not present

## 2015-09-12 DIAGNOSIS — I69251 Hemiplegia and hemiparesis following other nontraumatic intracranial hemorrhage affecting right dominant side: Secondary | ICD-10-CM | POA: Diagnosis present

## 2015-09-12 DIAGNOSIS — I69291 Dysphagia following other nontraumatic intracranial hemorrhage: Secondary | ICD-10-CM | POA: Diagnosis not present

## 2015-09-12 DIAGNOSIS — I610 Nontraumatic intracerebral hemorrhage in hemisphere, subcortical: Secondary | ICD-10-CM | POA: Diagnosis not present

## 2015-09-12 DIAGNOSIS — K589 Irritable bowel syndrome without diarrhea: Secondary | ICD-10-CM | POA: Diagnosis not present

## 2015-09-12 LAB — BASIC METABOLIC PANEL
ANION GAP: 8 (ref 5–15)
BUN: 39 mg/dL — ABNORMAL HIGH (ref 6–20)
CALCIUM: 9.3 mg/dL (ref 8.9–10.3)
CHLORIDE: 107 mmol/L (ref 101–111)
CO2: 22 mmol/L (ref 22–32)
Creatinine, Ser: 0.88 mg/dL (ref 0.44–1.00)
GFR calc non Af Amer: >60 mL/min (ref >60)
Glucose, Bld: 110 mg/dL — ABNORMAL HIGH (ref 65–99)
Potassium: 4.2 mmol/L (ref 3.5–5.1)
Sodium: 137 mmol/L (ref 135–145)

## 2015-09-12 NOTE — Progress Notes (Signed)
Speech Language Pathology Discharge Summary  Patient Details  Name: [name] MRN: [id number] Date of Birth: 06/12/1940   Patient has met 8 of 8 long term goals.  Patient to discharge at overall Mod;Min level.   Reasons goals not met: N/A    Clinical Impression/Discharge Summary: Patient has made excellent gains and has met 8 of 8 LTGs this admission due to improved swallowing and cognitive function as well as functional communication. Currently, patient is consuming Dys. 2 textures with thin liquids without overt s/s of aspiration and requires supervision-Min A verbal cues for use of swallowing compensatory strategies. Patient also requires Mod A verbal and question cues to complete functional and familiar tasks safely in regards to functional problem solving, selective attention, emergent awareness and overall safety awareness. Patient can understand basic information with overall Min A cueing and can verbalize her wants/needs at the at the word level and with multimodal communication with extra time and overall Mod A multimodal cueing. Patient and family education is complete for this venue of care. Patient's family is unable to provide the necessary physical and cognitive assistance at this time, therefore, patient will discharge to a SNF. Patient would benefit from f/u SLP services to maximize her cognitive and swallowing function as well as her functional communication in order to maximize her overall functional independence and reduce caregiver burden.   Care Partner:  Caregiver Able to Provide Assistance: No  Type of Caregiver Assistance: Physical;Cognitive  Recommendation:  24 hour supervision/assistance;Skilled Nursing facility  Rationale for SLP Follow Up: Maximize functional communication;Maximize cognitive function and independence;Reduce caregiver burden;Maximize swallowing safety   Equipment: N/A   Reasons for discharge: Discharged from hospital   Patient/Family Agrees  with Progress Made and Goals Achieved: Yes     ,  09/12/2015, 7:22 AM    

## 2015-09-12 NOTE — Progress Notes (Signed)
[facility]     PROGRESS NOTE    Subjective/Complaints: Patient ate some breakfast this morning. She is aware that she is going to a skilled nursing facility. She could not tell me the name. She denies any pains or breathing complaints. Fairly accurate with yes no   ROS: remain limited due to cognitive status  Objective: Vital Signs: Blood pressure 135/64, pulse 77, temperature 98 F (36.7 C), temperature source Oral, resp. rate 18, weight 63.005 kg (138 lb 14.4 oz), SpO2 96 %. No results found.  Recent Labs  09/10/15 0657  WBC 10.4  HGB 12.4  HCT 36.9  PLT 269    Recent Labs  09/11/15 0545 09/12/15 0421  NA 136 137  K 4.1 4.2  CL 104 107  GLUCOSE 117* 110*  BUN 39* 39*  CREATININE 0.87 0.88  CALCIUM 9.6 9.3   CBG (last 3)   Recent Labs  09/10/15 1134  GLUCAP 109*    Wt Readings from Last 3 Encounters:  09/12/15 63.005 kg (138 lb 14.4 oz)  08/05/15 64.5 kg (142 lb 3.2 oz)  06/14/11 64.501 kg (142 lb 3.2 oz)    Physical Exam:  Constitutional: She appears well-developed, well nourished. NAD. HENT: Normocephalic. Atraumatic.  Cardiovascular: Normal rate and regular rhythm. + systolic Murmur Respiratory: Effort normal and breath sounds normal. No respiratory distress. no wheezes or rales GI: Soft. Bowel sounds are normal. gtube site appears clean without drainage  Neurological: speaks in low volume with word finding deficits at times. Needs cueing to increase volume.  Alert.   Follows simple commands DTRs 3+ right upper and right lower extremity   Motor:  Right upper extremity and right lower extremity:  0/5 Left upper and left lower extremity >/3/5 or more (patient not participating in MMT) Flexor tone RUE---2/4 biceps, trace extensor tone RLE Skin: Skin is warm and dry.  Psych:  Calm/seems to be in good spirits  Assessment/Plan: 1. 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 Naaman Plummer as an outpatient See discharge summary Function:  Bathing Bathing position Bathing activity did not occur: N/A Position: Shower  Bathing parts Body parts bathed by patient: Right arm, Chest, Abdomen, Front perineal area, Right upper leg, Left upper leg, Left lower leg Body parts bathed by helper: Left arm, Buttocks, Right lower leg  Bathing assist Assist Level: Touching or steadying assistance(Pt > 75%)      Upper Body Dressing/Undressing Upper body dressing Upper body dressing/undressing activity did not occur: N/A What is the patient wearing?: Pull over shirt/dress     Pull over shirt/dress - Perfomed by patient: Thread/unthread left sleeve, Put head through opening, Pull shirt over trunk Pull over shirt/dress - Perfomed by helper: Thread/unthread right sleeve        Upper body assist Assist Level: Touching or steadying assistance(Pt > 75%)      Lower Body Dressing/Undressing Lower body dressing Lower body dressing/undressing activity did not occur: N/A What is the patient wearing?: Socks, Shoes, Pants     Pants- Performed by patient: Thread/unthread left pants leg Pants- Performed by helper: Thread/unthread right pants leg, Pull pants up/down Non-skid slipper socks- Performed by patient: Don/doff left sock Non-skid slipper socks- Performed by helper: Don/doff right sock Socks - Performed by patient: Don/doff left sock Socks - Performed by helper: Don/doff right sock Shoes - Performed by patient: Don/doff left shoe Shoes - Performed by helper: Fasten right, [employer]  left, Don/doff right shoe       TED Hose - Performed by helper: Don/doff right TED hose, Don/doff left TED hose  Lower body assist Assist for lower body dressing: Touching or steadying assistance (Pt > 75%)      Toileting Toileting Toileting activity did not occur: No continent bowel/bladder event Toileting steps completed by  patient: Performs perineal hygiene Toileting steps completed by helper: Adjust clothing prior to toileting, Adjust clothing after toileting Toileting Assistive Devices: Grab bar or rail  Toileting assist Assist level: Touching or steadying assistance (Pt.75%)   Transfers Chair/bed transfer   Chair/bed transfer method: Stand pivot Chair/bed transfer assist level: 2 helpers Chair/bed transfer assistive device: Armrests Mechanical lift: Maximove   Locomotion Ambulation Ambulation activity did not occur: Safety/medical concerns   Max distance: 36 Assist level: Maximal assist (Pt 25 - 49%)   Wheelchair   Type: Manual Max wheelchair distance: 200 Assist Level: Supervision or verbal cues  Cognition Comprehension Comprehension assist level: Understands basic 50 - 74% of the time/ requires cueing 25 - 49% of the time  Expression Expression assist level: Expresses basic 25 - 49% of the time/requires cueing 50 - 75% of the time. Uses single words/gestures.  Social Interaction Social Interaction assist level: Interacts appropriately 50 - 74% of the time - May be physically or verbally inappropriate.  Problem Solving Problem solving assist level: Solves basic 25 - 49% of the time - needs direction more than half the time to initiate, plan or complete simple activities  Memory Memory assist level: Recognizes or recalls 25 - 49% of the time/requires cueing 50 - 75% of the time   Medical Problem List and Plan: 1. 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 DVT Prophylaxis/Anticoagulation: Subcutaneous heparin still indicated 3. Pain Management: Hydrocodone as needed 4. Dysphagia. Diet advanced to D2/thins ---eating more but not enough or consistently  -attention/cognitive factors still play a role in intake  -PEG feeds/H20 boluses  -continue megace   -decreased TF to 35cc/hr  overnight. If she eats less than 50% of meals then increase to 75%.  5. Neuropsych: This patient is not capable of making decisions on her own behalf.  -reactive depression---trial of remeron 15mg  qhs (may help with appetite too) 6. Skin/Wound Care: Routine skin checks,     8. Fluids/Electrolytes/Nutrition: following lytes---follow up labs reviewed today--BUN holding at 39  -potassium normal  -increased H20 Flushes on 5/24 for increased BUN 9. Seizure prophylaxis. Keppra held for somnolence EEG neg, stopped per neuro 10. Leukocytosis. Wbc's down to 10.1  11. Hypertension. Cozaar 100 mg daily. Monitor with increased mobility  -continue current regimen 12. Hyperlipidemia. Lipitor 13. Lethargy post stroke: improved  -continue ritalin at 10mg  bid beyond dc   14. Low grade temp resolved  - asp precautions.   15. Spasticity: PRAFO/WHO RUE RLE  -continue ROM/modalities/taping  -continue baclofen 10mg  TID 16. Loose stool: now soft/formed  -continue fiber  -continue probiotic     LOS (Days) 32 A FACE TO FACE EVALUATION WAS PERFORMED  Ronell Boldin E 09/12/2015 9:08 AM

## 2015-09-12 NOTE — Progress Notes (Signed)
Social Work  Discharge Note  The overall goal for the admission was met for:   Discharge location: No - plan changed to SNF as spouse unable to meet care needs  Length of Stay: Yes - 32 days  Discharge activity level: Yes - met min to max goals overall  Home/community participation: Yes  Services provided included: MD, RD, PT, OT, SLP, RN, TR, Pharmacy and SW  Financial Services: Medicare and Private Insurance: [employer]  Follow-up services arranged: Other: SNF placement at [facility]  Comments (or additional information):  Patient/Family verbalized understanding of follow-up arrangements: Yes  Individual responsible for coordination of the follow-up plan: spouse  Confirmed correct DME delivered: NA    [name]

## 2015-09-12 NOTE — Progress Notes (Signed)
Patient rested well throughout the night. Tolerated tube feeding without any complaints of abd pain, nausea or vomiting. Continued to refuse use of ortho splint to right hand. Requested tube feed be stopped at 0540. Tolerated meds crushed in applesauce well. No coughing noted. Aspiration precautions in place.

## 2015-09-16 ENCOUNTER — Telehealth: Payer: Self-pay

## 2015-09-16 NOTE — Telephone Encounter (Signed)
Scheduler at [facility] is aware of appointment on 09/24/15 @ 8:45 am.

## 2015-09-24 ENCOUNTER — Encounter: Payer: Medicare Other | Admitting: Physical Medicine & Rehabilitation

## 2015-09-29 ENCOUNTER — Telehealth: Payer: Self-pay | Admitting: Neurology

## 2015-09-29 NOTE — Telephone Encounter (Signed)
Ok to see prmary Md and if he refers may need to be worked in sooner

## 2015-09-29 NOTE — Telephone Encounter (Signed)
[name] husband called in to say his wife is complaining of pain on her paralyzed right side, trunk and both extreme ties. Pt has appt 7/17 but husband would like her seen earlier. Please call and advise [phone] or cell: [phone]( try first)

## 2015-09-29 NOTE — Telephone Encounter (Signed)
Message was sent to Dr. Leonie Man. Pt has a an appt as a new hospital follow up In July 2017. Pt has never been seen before at [facility]. Dr.Sethi saw her in the hospital..

## 2015-09-30 NOTE — Telephone Encounter (Signed)
RN call patients husband Shanon Brow about his wife right side, trunk, and extremities being paralyzed, and having pain. Pts husband stated his wife is in a nursing home since being discharge from the hospital in May 2017. Rn explain to husband that patient had referred to Dr. Alger Simons, Md for outpatient physical medicine and pain issues. Rn also husband why was her appt cancel last week with Dr. Alger Simons. Pts husband stated his wife had been discharge only two days, and he felt the need to cancel the appt. Pts husband stated his wife is getting rehab, ,and taking baclofen for the muscle spasms. Rn stated he can speak with the nursing home medical staff team at the facility and discuss her care. Rn stated pt has an appt with Dr. Leonie Man in July 2017.Rn gave patients husband the appt time and date for stroke hospital follow up. Rn stated to husband that if she is having chronic pain, Dr. Naaman Plummer is the one to contact for an appt. Pts husband was given contact information for Dr Naaman Plummer office to schedule an appt for his wife. Rn stated there is a referral in the system for her to see him. Pts husband verbalized understanding.

## 2015-10-01 DIAGNOSIS — R262 Difficulty in walking, not elsewhere classified: Secondary | ICD-10-CM | POA: Diagnosis not present

## 2015-10-06 ENCOUNTER — Encounter: Payer: Self-pay | Admitting: Physical Medicine & Rehabilitation

## 2015-10-06 ENCOUNTER — Encounter
Payer: No Typology Code available for payment source | Attending: Physical Medicine & Rehabilitation | Admitting: Physical Medicine & Rehabilitation

## 2015-10-06 VITALS — BP 143/88 | HR 80 | Resp 14

## 2015-10-06 DIAGNOSIS — I69321 Dysphasia following cerebral infarction: Secondary | ICD-10-CM | POA: Diagnosis not present

## 2015-10-06 DIAGNOSIS — I610 Nontraumatic intracerebral hemorrhage in hemisphere, subcortical: Secondary | ICD-10-CM

## 2015-10-06 DIAGNOSIS — K589 Irritable bowel syndrome without diarrhea: Secondary | ICD-10-CM | POA: Insufficient documentation

## 2015-10-06 DIAGNOSIS — R5383 Other fatigue: Secondary | ICD-10-CM | POA: Insufficient documentation

## 2015-10-06 DIAGNOSIS — Z5189 Encounter for other specified aftercare: Secondary | ICD-10-CM | POA: Insufficient documentation

## 2015-10-06 DIAGNOSIS — F329 Major depressive disorder, single episode, unspecified: Secondary | ICD-10-CM | POA: Insufficient documentation

## 2015-10-06 DIAGNOSIS — I69191 Dysphagia following nontraumatic intracerebral hemorrhage: Secondary | ICD-10-CM | POA: Diagnosis not present

## 2015-10-06 DIAGNOSIS — E785 Hyperlipidemia, unspecified: Secondary | ICD-10-CM | POA: Diagnosis not present

## 2015-10-06 DIAGNOSIS — I69320 Aphasia following cerebral infarction: Secondary | ICD-10-CM | POA: Diagnosis not present

## 2015-10-06 DIAGNOSIS — I10 Essential (primary) hypertension: Secondary | ICD-10-CM | POA: Insufficient documentation

## 2015-10-06 DIAGNOSIS — I69251 Hemiplegia and hemiparesis following other nontraumatic intracranial hemorrhage affecting right dominant side: Secondary | ICD-10-CM | POA: Insufficient documentation

## 2015-10-06 DIAGNOSIS — G8190 Hemiplegia, unspecified affecting unspecified side: Secondary | ICD-10-CM

## 2015-10-06 DIAGNOSIS — R531 Weakness: Secondary | ICD-10-CM | POA: Insufficient documentation

## 2015-10-06 DIAGNOSIS — R200 Anesthesia of skin: Secondary | ICD-10-CM | POA: Insufficient documentation

## 2015-10-06 DIAGNOSIS — R252 Cramp and spasm: Secondary | ICD-10-CM | POA: Insufficient documentation

## 2015-10-06 NOTE — Addendum Note (Signed)
Addended by: Alger Simons T on: 10/06/2015 12:36 PM   Modules accepted: Orders

## 2015-10-06 NOTE — Patient Instructions (Signed)
PLEASE CALL ME WITH ANY PROBLEMS OR QUESTIONS ([phone])  

## 2015-10-06 NOTE — Progress Notes (Signed)
Subjective:    Patient ID: [name], female    DOB: Jan 21, 1941, 75 y.o.   MRN: [id number]  HPI   This is a transitional care visit for [name]  in follow up of her left CVA. She has been at a SNF for continued rehab. She has been progressing with mobility and transfers. She has stood and walked short dx with therapy.   The patient notes increasing ight trunk and leg pain---it often bothers her at night and effects sleep. The pain is tingling in nature it appears. She also has had some mild right sided edema in both the arm and leg.    From a nutritional standpoint, she is maintaining on her own. Her appetite is excellent. She is able to tolerate her diet. The PEG is occasionally used to supplement fluids.   From an arousal standpoint she's improved. Ritalin has been backed off to daily. Her mood is improved also.    Pain Inventory Average Pain 9 Pain Right Now 1 My pain is intermittent and aching  In the last 24 hours, has pain interfered with the following? General activity 1 Relation with others 1 Enjoyment of life 3 What TIME of day is your pain at its worst? evening Sleep (in general) Poor  Pain is worse with: inactivity Pain improves with: heat/ice and medication Relief from Meds: 2  Mobility walk with assistance how many minutes can you walk? 4-5 ability to climb steps?  yes do you drive?  no use a wheelchair needs help with transfers Do you have any goals in this area?  yes  Function disabled: date disabled . I need assistance with the following:  dressing, bathing and toileting Do you have any goals in this area?  yes  Neuro/Psych weakness numbness trouble walking spasms  Prior Studies Any changes since last visit?  no  Physicians involved in your care Primary care Dr. Joylene Draft Neurologist Dr. Leonie Chase   Family History  Problem Relation Age of Onset  . Aortic aneurysm Father   . Heart failure Mother    Social History   Social History    . Marital Status: Married    Spouse Name: Dr. Genelle Bal  . Number of Children: N/A  . Years of Education: N/A   Social History Main Topics  . Smoking status: Never Smoker   . Smokeless tobacco: Never Used  . Alcohol Use: Yes  . Drug Use: None  . Sexual Activity: Not Asked   Other Topics Concern  . None   Social History Narrative   Past Surgical History  Procedure Laterality Date  . Cesarean section      x 4  . Vesicovaginal fistula closure w/ tah    . Breast lumpectomy      Right-benign  . Tonsillectomy    . Appendectomy    . Bronchoscopy      Video bronch w/ TBBX 2012   Past Medical History  Diagnosis Date  . Bronchitis, not specified as acute or chronic   . Other chronic sinusitis   . Other chronic allergic conjunctivitis   . Allergic rhinitis, cause unspecified   . 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 Acute renal insufficiency     due to amphotericin 2012  . Irritable bowel   . Hypertension   . Diverticulosis    BP 143/88 mmHg  Pulse 80  Resp 14  SpO2 97%  Opioid Risk Score:   Fall Risk Score:  `1  Depression screen PHQ 2/9  Depression screen PHQ 2/9 10/06/2015  Decreased Interest 0  Down, Depressed, Hopeless 1  PHQ - 2 Score 1  Altered sleeping 3  Tired, decreased energy 0  Change in appetite 0  Feeling bad or failure about yourself  1  Trouble concentrating 2  Moving slowly or fidgety/restless 3  Suicidal thoughts 0  PHQ-9 Score 10  Difficult doing work/chores Very difficult   ' Review of Systems  Cardiovascular: Positive for leg swelling.  All other systems reviewed and are negative.      Objective:   Physical Exam  Constitutional: She appears well-developed, well nourished. NAD. HENT: Normocephalic. Atraumatic.  Cardiovascular: Normal rate and regular rhythm. + systolic Murmur Respiratory: Effort normal and breath sounds normal. No respiratory distress. no wheezes or rales GI: PEG tight on skin.    Neurological: speaks in low volume with word finding deficits at times. Needs cueing to increase volume.  Alert. Follows simple commands DTRs 3+ right upper and right lower extremity  Motor:  Right upper extremity and right lower extremity: 0/5 Left upper and left lower extremity >/3/5 or more (patient not participating in MMT) Flexor tone RUE---2/4 biceps, trace extensor tone RLE Skin: Skin is warm and dry.  Psych: Calm/seems to be in good spirits       Assessment & Plan:  Medical Problem List and Plan: 1. 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  -right AFO at some point 3. Central neuropathic pain: add gabapentin titrating up to 100mg  TID for neuropathic pain 4. Dysphagia. Continue current diet  -make referral for PEG removal with INR in two weeks 5. Neuropsych: This patient is not capable of making decisions on her own behalf. -reactive depression--remeron, celexa---can slowly wean over the summer-keep for now 6. Hypertension. Cozaar 100 mg daily. Monitor with increased mobility -continue current regimen 12. Hyperlipidemia. Lipitor 13. Lethargy post stroke: resolved -dc ritalin   15. Spasticity: continue orthotics, ROM

## 2015-10-08 DIAGNOSIS — F329 Major depressive disorder, single episode, unspecified: Secondary | ICD-10-CM | POA: Diagnosis not present

## 2015-10-14 ENCOUNTER — Telehealth (HOSPITAL_COMMUNITY): Payer: Self-pay

## 2015-10-14 ENCOUNTER — Other Ambulatory Visit: Payer: Self-pay | Admitting: Physical Medicine & Rehabilitation

## 2015-10-14 DIAGNOSIS — I69391 Dysphagia following cerebral infarction: Secondary | ICD-10-CM

## 2015-10-14 DIAGNOSIS — I610 Nontraumatic intracerebral hemorrhage in hemisphere, subcortical: Secondary | ICD-10-CM

## 2015-10-14 NOTE — Telephone Encounter (Signed)
Called to schedule peg tube removal, left message for pt to call back. AW

## 2015-10-22 ENCOUNTER — Ambulatory Visit (HOSPITAL_COMMUNITY)
Admission: RE | Admit: 2015-10-22 | Discharge: 2015-10-22 | Disposition: A | Discharge status: Home / Self Care | Payer: No Typology Code available for payment source | Source: Ambulatory Visit | Attending: Physical Medicine & Rehabilitation | Admitting: Physical Medicine & Rehabilitation

## 2015-10-22 DIAGNOSIS — I69391 Dysphagia following cerebral infarction: Secondary | ICD-10-CM | POA: Diagnosis not present

## 2015-10-22 DIAGNOSIS — Z431 Encounter for attention to gastrostomy: Secondary | ICD-10-CM | POA: Insufficient documentation

## 2015-10-22 DIAGNOSIS — I610 Nontraumatic intracerebral hemorrhage in hemisphere, subcortical: Secondary | ICD-10-CM

## 2015-11-03 ENCOUNTER — Encounter: Payer: Self-pay | Admitting: Neurology

## 2015-11-03 ENCOUNTER — Ambulatory Visit (INDEPENDENT_AMBULATORY_CARE_PROVIDER_SITE_OTHER): Payer: Medicare Other | Admitting: Neurology

## 2015-11-03 VITALS — BP 153/88 | HR 77 | Ht 64.0 in

## 2015-11-03 DIAGNOSIS — R4701 Aphasia: Secondary | ICD-10-CM | POA: Insufficient documentation

## 2015-11-03 NOTE — Patient Instructions (Signed)
I had a long d/w patient and her husband  Dr London Pepper about her recent hemorrhagic stroke, risk for recurrent stroke/TIAs, personally independently reviewed imaging studies and stroke evaluation results and answered questions.Continue  strict control of hypertension with blood pressure goal below 140/90,   and lipids with LDL cholesterol goal below 70 mg/dL. I encouraged her to increase her oral intake of food as well as fluids. Continue ongoing physical, occupational speech therapy. Patient planning to move to [facility] apartment with her husband soon. She will return for follow-up in 6 months or call earlier if necessary

## 2015-11-03 NOTE — Progress Notes (Addendum)
[facility] [address] [place]. [place] [address] [phone]       OFFICE FOLLOW-UP NOTE  [name] Date of Birth:  [birth year] Medical Record Number:  [id number]   HPI: [name] is a 57 year pleasant Caucasian lady seen today for first office follow-up visit for hospital admission for intracerebral hemorrhage in April 2017. [name] is an 75 y.o. female with a history of hypertension, brought to the emergency room in Code stroke status following acute onset of right-sided weakness and speech difficulty. She was last known well at 5:20 PM today. She has no previous history of stroke or TIA. CT scan of her head with a large left basal ganglia hemorrhage with no extension into left lateral ventricle. Blood pressure was 168/89. Patient was unable to speak and was neglecting her right side, with a gaze to the left. She had no voluntary movement of right extremities. NIH stroke score was 25. LSN: 5:20 PM on 08/01/2015 tPA Given: No: Acute ICH  she was admitted to intensive care unit and blood pressure was tightly controlled. MRI scan of the brain done on 08/02/15 showed increased size of the intraparenchymal hemorrhage centered around left basal ganglia now measuring 5.6 x 3.3 x 3.6 cm for an estimated volume of 33 cubic cc. No associated left to right midline shift. She was closely neurologically monitored and remained stable with follow-up serial imaging showing stable appearance. She remained aphasic with dense right hemiplegia on exam. She was seen by physical occupational therapy as well as rehabilitation medicine for consult and was transferred to inpatient rehabilitation. She had urinary tract infection as well as some in the [place] abnormalities that but gradually made improvement. She was transferred to pain even for rehabilitation but is currently they're in the skilled facility. She is still getting physical occupational speech therapy daily but she has  unfortunately not been significant improvement. She is able to speak short words and sentences but her comprehension has improved but language and hasn't not improved so much. She still has no significant strength on the right side and has dense hemiplegia. She is able to stand with Lexapro and can help with transfers but is not able to walk a whole lot. She has able to swallow better and hyperactive as come out but she did husband feels she does not eat enough. She had some spasms in her right leg recently and saw Dr. Tessa Lerner who added gabapentin which seems to be helping. The patient's husband plans to move into an apartment in [place] and wants his wife to move up there soon. Blood pressure has remained in the [id number] systolic range and could do better. She is getting baclofen 10 mg 3 times daily for spasticity.  ROS:   14 system review of systems is positive for leg swelling, muscle cramps, walking difficulty, numbness, speech difficulty, weakness, facial drooping, leg spasms and all other systems negative  PMH:  Past Medical History  Diagnosis Date  . Bronchitis, not specified as acute or chronic   . Other chronic sinusitis   . Other chronic allergic conjunctivitis   . Allergic rhinitis, cause unspecified   . 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 Acute renal insufficiency     due to amphotericin 2012  . Irritable bowel   . Hypertension   . Diverticulosis   . Stroke [facility])     Social History:  Social History   Social History  . Marital Status: Married  Spouse Name: Dr. Genelle Bal  . Number of Children: N/A  . Years of Education: N/A   Occupational History  . Not on file.   Social History Main Topics  . Smoking status: Never Smoker   . Smokeless tobacco: Never Used  . Alcohol Use: 0.6 oz/week    1 Glasses of wine per week     Comment: occasionally  . Drug Use: Not on file  . Sexual Activity: Not on file   Other Topics Concern  . Not on file   Social  History Narrative    Medications:   Current Outpatient Prescriptions on File Prior to Visit  Medication Sig Dispense Refill  . atorvastatin (LIPITOR) 20 MG tablet Take 20 mg by mouth at bedtime.   3  . baclofen (LIORESAL) 10 MG tablet Take 10 mg by mouth 3 (three) times daily.    . bimatoprost (LUMIGAN) 0.01 % SOLN Place 1 drop into both eyes at bedtime.    . citalopram (CELEXA) 10 MG tablet Take 10 mg by mouth daily.    Marland Kitchen latanoprost (XALATAN) 0.005 % ophthalmic solution 1 drop at bedtime.    . lidocaine (LIDODERM) 5 % Place 1 patch onto the skin daily. Remove & Discard patch within 12 hours or as directed by MD    . loratadine (CLARITIN) 10 MG tablet Take 10 mg by mouth daily.    Marland Kitchen losartan (COZAAR) 100 MG tablet Take 100 mg by mouth at bedtime.   3  . Melatonin 1 MG TABS Take by mouth.    . mirtazapine (REMERON) 15 MG tablet Take 1 tablet (15 mg total) by mouth at bedtime. 3 tablet 0  . Olopatadine HCl (PATADAY) 0.2 % SOLN Place 1 drop into both eyes every 12 (twelve) hours as needed (seasonal allergies).    . pantoprazole (PROTONIX) 40 MG tablet Take 40 mg by mouth daily.    . Probiotic Product (ALIGN) 4 MG CAPS Take 4 mg by mouth 2 (two) times daily.    . sennosides-docusate sodium (SENOKOT-S) 8.6-50 MG tablet Take 1 tablet by mouth daily.     No current facility-administered medications on file prior to visit.    Allergies:   Allergies  Allergen Reactions  . Penicillins Hives and Rash    Has patient had a PCN reaction causing immediate rash, facial/tongue/throat swelling, SOB or lightheadedness with hypotension: Yes Has patient had a PCN reaction causing severe rash involving mucus membranes or skin necrosis: No Has patient had a PCN reaction that required hospitalization unknown Has patient had a PCN reaction occurring within the last 10 years: No If all of the above answers are "NO", then may proceed with Cephalosporin use.    Physical Exam General: Frail elderly Caucasian  lady seated, in no evident distress Head: head normocephalic and atraumatic.  Neck: supple with no carotid or supraclavicular bruits Cardiovascular: regular rate and rhythm, soft ejection systolicmurmur Musculoskeletal: no deformity Skin:  no rash/petichiae Vascular:  Normal pulses all extremities Filed Vitals:   11/03/15 1416  BP: 153/88  Pulse: 77   Neurologic Exam Mental Status: Awake and fully alert. Severe expressive aphasia and can speak only a few words and not sentences. Able to name and comprehend much better.. Mood and affect appropriate.  Cranial Nerves: Fundoscopic exam reveals sharp disc margins. Pupils equal, briskly reactive to light. Extraocular movements full without nystagmus. Visual fields Decreased blink to threat on the right to confrontation. Hearing intact. Facial sensation intact. Moderate right lower Face weakness.. Tongue, palate  moves normally and symmetrically.  Motor: Normal bulk and tone. Normal strength in all tested extremity muscles on the left side but dense right hemiplegia with 0/5 strength. Spasticity in the right leg with increased tone and ill sustained right ankle clonus.. Sensory.: intact to touch ,pinprick .position and vibratory sensation.  Coordination: Rapid alternating movements normal in left extremities. And cannot test on the right side  Gait and Station: Not tested as patient unable to walk and sitting in wheelchair  Reflexes: 2+ and asymmetric brisker on the right. Toes downgoing.   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 I encouraged her to increase her oral intake of food as well as fluids. Continue ongoing physical, occupational speech therapy. Patient planning to move to [facility] apartment with her husband soon. She will return for follow-up in 6 months or call earlier if necessary Greater than 50% of time during this 25 minute visit was spent on counseling,explanation of diagnosis, planning of further management, discussion with patient and family and coordination of care Antony Contras, MD Medical Director [place] Pager: [phone] 11/03/2015 3:07 PM  Note: This document was prepared with digital dictation and possible smart phrase technology. Any transcriptional errors that result from this process are unintentional

## 2015-11-04 DIAGNOSIS — E860 Dehydration: Secondary | ICD-10-CM | POA: Diagnosis not present

## 2015-11-10 ENCOUNTER — Encounter: Payer: Self-pay | Admitting: Physical Medicine & Rehabilitation

## 2015-11-10 ENCOUNTER — Encounter: Payer: Medicare Other | Attending: Physical Medicine & Rehabilitation | Admitting: Physical Medicine & Rehabilitation

## 2015-11-10 VITALS — BP 158/83 | HR 87

## 2015-11-10 DIAGNOSIS — Z5189 Encounter for other specified aftercare: Secondary | ICD-10-CM | POA: Diagnosis not present

## 2015-11-10 DIAGNOSIS — R531 Weakness: Secondary | ICD-10-CM | POA: Insufficient documentation

## 2015-11-10 DIAGNOSIS — I69251 Hemiplegia and hemiparesis following other nontraumatic intracranial hemorrhage affecting right dominant side: Secondary | ICD-10-CM | POA: Diagnosis not present

## 2015-11-10 DIAGNOSIS — E785 Hyperlipidemia, unspecified: Secondary | ICD-10-CM | POA: Diagnosis not present

## 2015-11-10 DIAGNOSIS — F329 Major depressive disorder, single episode, unspecified: Secondary | ICD-10-CM | POA: Insufficient documentation

## 2015-11-10 DIAGNOSIS — R5383 Other fatigue: Secondary | ICD-10-CM | POA: Insufficient documentation

## 2015-11-10 DIAGNOSIS — K589 Irritable bowel syndrome without diarrhea: Secondary | ICD-10-CM | POA: Diagnosis not present

## 2015-11-10 DIAGNOSIS — I610 Nontraumatic intracerebral hemorrhage in hemisphere, subcortical: Secondary | ICD-10-CM

## 2015-11-10 DIAGNOSIS — R200 Anesthesia of skin: Secondary | ICD-10-CM | POA: Insufficient documentation

## 2015-11-10 DIAGNOSIS — I69191 Dysphagia following nontraumatic intracerebral hemorrhage: Secondary | ICD-10-CM | POA: Insufficient documentation

## 2015-11-10 DIAGNOSIS — E46 Unspecified protein-calorie malnutrition: Secondary | ICD-10-CM | POA: Diagnosis not present

## 2015-11-10 DIAGNOSIS — I10 Essential (primary) hypertension: Secondary | ICD-10-CM

## 2015-11-10 DIAGNOSIS — R252 Cramp and spasm: Secondary | ICD-10-CM | POA: Diagnosis not present

## 2015-11-10 DIAGNOSIS — G8190 Hemiplegia, unspecified affecting unspecified side: Secondary | ICD-10-CM | POA: Diagnosis not present

## 2015-11-10 NOTE — Progress Notes (Signed)
Subjective:    Patient ID: [name], female    DOB: [birth year], 75 y.o.   MRN: [id number]  HPI   [name] is here in follow up of her left CVA. She remains at [facility] where she's receiving SNF level care. She has a right hinged knee brace with AFO  The increase in gabapentin has helped her neuropathic pain, but her appetite seems to decreased since starting on the medication. She is down to 117lbs most recently.  Her mood has been good. She has been sleeping. Her PEG is out.   Her husband is moving into [facility] next month. He hopes to bring [place] with him with some extra hired help.    Pain Inventory Average Pain 4 Pain Right Now 4 My pain is intermittent, dull and stabbing  In the last 24 hours, has pain interfered with the following? General activity 2 Relation with others 2 Enjoyment of life 4 What TIME of day is your pain at its worst? evening Sleep (in general) Fair  Pain is worse with: some activites Pain improves with: heat/ice and medication Relief from Meds: 8  Mobility walk with assistance how many minutes can you walk? 10-12 ability to climb steps?  no do you drive?  no use a wheelchair needs help with transfers Do you have any goals in this area?  yes  Function disabled: date disabled 07-01-15 I need assistance with the following:  dressing, bathing, toileting, meal prep, household duties and shopping  Neuro/Psych weakness numbness spasms depression  Prior Studies Any changes since last visit?  no  Physicians involved in your care Any changes since last visit?  yes   Family History  Problem Relation Age of Onset  . Aortic aneurysm Father   . Heart failure Mother    Social History   Social History  . Marital status: Married    Spouse name: Dr. Genelle Bal  . Number of children: N/A  . Years of education: N/A   Social History Main Topics  . Smoking status: Never Smoker  . Smokeless tobacco: Never Used  . Alcohol  use 0.6 oz/week    1 Glasses of wine per week     Comment: occasionally  . Drug use: Unknown  . Sexual activity: Not Asked   Other Topics Concern  . None   Social History Narrative  . None   Past Surgical History:  Procedure Laterality Date  . APPENDECTOMY    . BREAST LUMPECTOMY     Right-benign  . BRONCHOSCOPY     Video bronch w/ TBBX 2012  . CESAREAN SECTION     x 4  . TONSILLECTOMY    . VESICOVAGINAL FISTULA CLOSURE W/ TAH     Past Medical History:  Diagnosis Date  . Acute renal insufficiency    due to amphotericin 2012  . Allergic rhinitis, cause unspecified   . Bronchitis, not specified as acute or chronic   . Diverticulosis   . 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 Hypertension   . Irritable bowel   . Other chronic allergic conjunctivitis   . Other chronic sinusitis   . Stroke ([place])    BP (!) 158/83 (BP Location: Left Arm, Patient Position: Sitting, Cuff Size: Normal)   Pulse 87   SpO2 94%   Opioid Risk Score:   Fall Risk Score:  `1  Depression screen PHQ 2/9  Depression screen PHQ 2/9 10/06/2015  Decreased Interest 0  Down, Depressed, Hopeless 1  PHQ - 2 Score 1  Altered sleeping 3  Tired, decreased energy 0  Change in appetite 0  Feeling bad or failure about yourself  1  Trouble concentrating 2  Moving slowly or fidgety/restless 3  Suicidal thoughts 0  PHQ-9 Score 10  Difficult doing work/chores Very difficult    Review of Systems  HENT: Negative.   Eyes: Negative.   Respiratory: Negative.   Cardiovascular: Negative.   Gastrointestinal: Negative.   Endocrine: Negative.   Genitourinary: Negative.   Musculoskeletal: Negative.   Skin: Negative.   Allergic/Immunologic: Negative.   Neurological: Positive for speech difficulty, weakness and numbness.  Hematological: Negative.   Psychiatric/Behavioral: Negative.        Objective:   Physical Exam     Constitutional: She appears well-developed, well nourished. NAD. HENT:  Normocephalic. Atraumatic.  Cardiovascular: Normal rate and regular rhythm. + systolic Murmur Respiratory: Effort normal and breath sounds normal. No respiratory distress. no wheezes or rales GI: PEG site with hypergranulation Musc: 1/2 sublux anterior right shoulder Neurological: speaks in low volume with word finding deficits at times.    Alert. still some apraxia DTRs 3+ right upper and right lower extremity  Motor:  Right upper extremity and right lower extremity: 0/5 RLE; 1/5 HAD, KE , trace ADF Left upper and left lower extremity 4/5. Flexor tone RUE---2/4 biceps, trace extensor tone RLE Skin: Skin is warm and dry.  Psych: quite, much more interactive though.       Assessment & Plan:  Medical Problem List and Plan: 1. Right hemiplegia, aphasia, dysphagia secondary to left basal ganglia hypertensive intracranial hemorrhage -continue SNF-based therapies -transition to [facility] with some assistance in apt later this summer? 3. Central neuropathic pain: continue gabapentin  100mg  TID   -if she continues to struggle with appetite will need to re-examine 4. Dysphagia. Continue current diet  -make referral for PEG removal with INR in two weeks 5. Neuropsych: This patient is not capable of making decisions on her own behalf. -reactive depression--dc celexa  -increase remeron to 30mg  qhs to help with appetite 6. Hypertension. Cozaar 100 mg daily. Monitor with increased mobility -add low dose norvasc 2.5mg  daily---titrate to bp <140/80   15. Spasticity: knee brace/afo---Might be a little "much" --would have used a stiffer AFO---but we'll go with what she's got for now.

## 2015-11-10 NOTE — Patient Instructions (Signed)
PLEASE CALL ME WITH ANY PROBLEMS OR QUESTIONS ([phone])  

## 2015-11-14 DIAGNOSIS — E785 Hyperlipidemia, unspecified: Secondary | ICD-10-CM | POA: Diagnosis not present

## 2015-11-14 DIAGNOSIS — I10 Essential (primary) hypertension: Secondary | ICD-10-CM | POA: Diagnosis not present

## 2015-11-14 DIAGNOSIS — I6200 Nontraumatic subdural hemorrhage, unspecified: Secondary | ICD-10-CM | POA: Diagnosis not present

## 2015-11-14 DIAGNOSIS — F329 Major depressive disorder, single episode, unspecified: Secondary | ICD-10-CM | POA: Diagnosis not present

## 2015-11-18 DIAGNOSIS — M6281 Muscle weakness (generalized): Secondary | ICD-10-CM | POA: Diagnosis not present

## 2015-11-18 DIAGNOSIS — R2689 Other abnormalities of gait and mobility: Secondary | ICD-10-CM | POA: Diagnosis not present

## 2015-11-18 DIAGNOSIS — R278 Other lack of coordination: Secondary | ICD-10-CM | POA: Diagnosis not present

## 2015-11-18 DIAGNOSIS — R41841 Cognitive communication deficit: Secondary | ICD-10-CM | POA: Diagnosis not present

## 2015-11-18 DIAGNOSIS — R1312 Dysphagia, oropharyngeal phase: Secondary | ICD-10-CM | POA: Diagnosis not present

## 2015-11-19 DIAGNOSIS — R278 Other lack of coordination: Secondary | ICD-10-CM | POA: Diagnosis not present

## 2015-11-19 DIAGNOSIS — M6281 Muscle weakness (generalized): Secondary | ICD-10-CM | POA: Diagnosis not present

## 2015-11-19 DIAGNOSIS — R1312 Dysphagia, oropharyngeal phase: Secondary | ICD-10-CM | POA: Diagnosis not present

## 2015-11-19 DIAGNOSIS — R41841 Cognitive communication deficit: Secondary | ICD-10-CM | POA: Diagnosis not present

## 2015-11-19 DIAGNOSIS — R2689 Other abnormalities of gait and mobility: Secondary | ICD-10-CM | POA: Diagnosis not present

## 2015-11-20 DIAGNOSIS — M6281 Muscle weakness (generalized): Secondary | ICD-10-CM | POA: Diagnosis not present

## 2015-11-20 DIAGNOSIS — R278 Other lack of coordination: Secondary | ICD-10-CM | POA: Diagnosis not present

## 2015-11-20 DIAGNOSIS — R2689 Other abnormalities of gait and mobility: Secondary | ICD-10-CM | POA: Diagnosis not present

## 2015-11-20 DIAGNOSIS — R41841 Cognitive communication deficit: Secondary | ICD-10-CM | POA: Diagnosis not present

## 2015-11-20 DIAGNOSIS — R1312 Dysphagia, oropharyngeal phase: Secondary | ICD-10-CM | POA: Diagnosis not present

## 2015-11-21 DIAGNOSIS — R2689 Other abnormalities of gait and mobility: Secondary | ICD-10-CM | POA: Diagnosis not present

## 2015-11-21 DIAGNOSIS — R278 Other lack of coordination: Secondary | ICD-10-CM | POA: Diagnosis not present

## 2015-11-21 DIAGNOSIS — R41841 Cognitive communication deficit: Secondary | ICD-10-CM | POA: Diagnosis not present

## 2015-11-21 DIAGNOSIS — R1312 Dysphagia, oropharyngeal phase: Secondary | ICD-10-CM | POA: Diagnosis not present

## 2015-11-21 DIAGNOSIS — M6281 Muscle weakness (generalized): Secondary | ICD-10-CM | POA: Diagnosis not present

## 2015-11-24 DIAGNOSIS — R1312 Dysphagia, oropharyngeal phase: Secondary | ICD-10-CM | POA: Diagnosis not present

## 2015-11-24 DIAGNOSIS — R41841 Cognitive communication deficit: Secondary | ICD-10-CM | POA: Diagnosis not present

## 2015-11-24 DIAGNOSIS — M6281 Muscle weakness (generalized): Secondary | ICD-10-CM | POA: Diagnosis not present

## 2015-11-24 DIAGNOSIS — R278 Other lack of coordination: Secondary | ICD-10-CM | POA: Diagnosis not present

## 2015-11-24 DIAGNOSIS — R2689 Other abnormalities of gait and mobility: Secondary | ICD-10-CM | POA: Diagnosis not present

## 2015-11-25 DIAGNOSIS — R1312 Dysphagia, oropharyngeal phase: Secondary | ICD-10-CM | POA: Diagnosis not present

## 2015-11-25 DIAGNOSIS — R2689 Other abnormalities of gait and mobility: Secondary | ICD-10-CM | POA: Diagnosis not present

## 2015-11-25 DIAGNOSIS — M6281 Muscle weakness (generalized): Secondary | ICD-10-CM | POA: Diagnosis not present

## 2015-11-25 DIAGNOSIS — R41841 Cognitive communication deficit: Secondary | ICD-10-CM | POA: Diagnosis not present

## 2015-11-25 DIAGNOSIS — R278 Other lack of coordination: Secondary | ICD-10-CM | POA: Diagnosis not present

## 2015-11-26 DIAGNOSIS — I69151 Hemiplegia and hemiparesis following nontraumatic intracerebral hemorrhage affecting right dominant side: Secondary | ICD-10-CM | POA: Diagnosis not present

## 2015-11-26 DIAGNOSIS — R2689 Other abnormalities of gait and mobility: Secondary | ICD-10-CM | POA: Diagnosis not present

## 2015-11-26 DIAGNOSIS — I69191 Dysphagia following nontraumatic intracerebral hemorrhage: Secondary | ICD-10-CM | POA: Diagnosis not present

## 2015-11-26 DIAGNOSIS — R41841 Cognitive communication deficit: Secondary | ICD-10-CM | POA: Diagnosis not present

## 2015-11-26 DIAGNOSIS — R278 Other lack of coordination: Secondary | ICD-10-CM | POA: Diagnosis not present

## 2015-11-26 DIAGNOSIS — I10 Essential (primary) hypertension: Secondary | ICD-10-CM | POA: Diagnosis not present

## 2015-11-26 DIAGNOSIS — M6281 Muscle weakness (generalized): Secondary | ICD-10-CM | POA: Diagnosis not present

## 2015-11-26 DIAGNOSIS — R1312 Dysphagia, oropharyngeal phase: Secondary | ICD-10-CM | POA: Diagnosis not present

## 2015-11-26 DIAGNOSIS — E785 Hyperlipidemia, unspecified: Secondary | ICD-10-CM | POA: Diagnosis not present

## 2015-11-27 DIAGNOSIS — R278 Other lack of coordination: Secondary | ICD-10-CM | POA: Diagnosis not present

## 2015-11-27 DIAGNOSIS — M6281 Muscle weakness (generalized): Secondary | ICD-10-CM | POA: Diagnosis not present

## 2015-11-27 DIAGNOSIS — R41841 Cognitive communication deficit: Secondary | ICD-10-CM | POA: Diagnosis not present

## 2015-11-27 DIAGNOSIS — R2689 Other abnormalities of gait and mobility: Secondary | ICD-10-CM | POA: Diagnosis not present

## 2015-11-27 DIAGNOSIS — R1312 Dysphagia, oropharyngeal phase: Secondary | ICD-10-CM | POA: Diagnosis not present

## 2015-11-28 DIAGNOSIS — R41841 Cognitive communication deficit: Secondary | ICD-10-CM | POA: Diagnosis not present

## 2015-11-28 DIAGNOSIS — R2689 Other abnormalities of gait and mobility: Secondary | ICD-10-CM | POA: Diagnosis not present

## 2015-11-28 DIAGNOSIS — R1312 Dysphagia, oropharyngeal phase: Secondary | ICD-10-CM | POA: Diagnosis not present

## 2015-11-28 DIAGNOSIS — M6281 Muscle weakness (generalized): Secondary | ICD-10-CM | POA: Diagnosis not present

## 2015-11-28 DIAGNOSIS — R278 Other lack of coordination: Secondary | ICD-10-CM | POA: Diagnosis not present

## 2015-12-01 DIAGNOSIS — R41841 Cognitive communication deficit: Secondary | ICD-10-CM | POA: Diagnosis not present

## 2015-12-01 DIAGNOSIS — R278 Other lack of coordination: Secondary | ICD-10-CM | POA: Diagnosis not present

## 2015-12-01 DIAGNOSIS — M6281 Muscle weakness (generalized): Secondary | ICD-10-CM | POA: Diagnosis not present

## 2015-12-01 DIAGNOSIS — R2689 Other abnormalities of gait and mobility: Secondary | ICD-10-CM | POA: Diagnosis not present

## 2015-12-01 DIAGNOSIS — R1312 Dysphagia, oropharyngeal phase: Secondary | ICD-10-CM | POA: Diagnosis not present

## 2015-12-02 DIAGNOSIS — R278 Other lack of coordination: Secondary | ICD-10-CM | POA: Diagnosis not present

## 2015-12-02 DIAGNOSIS — R1312 Dysphagia, oropharyngeal phase: Secondary | ICD-10-CM | POA: Diagnosis not present

## 2015-12-02 DIAGNOSIS — R2689 Other abnormalities of gait and mobility: Secondary | ICD-10-CM | POA: Diagnosis not present

## 2015-12-02 DIAGNOSIS — R41841 Cognitive communication deficit: Secondary | ICD-10-CM | POA: Diagnosis not present

## 2015-12-02 DIAGNOSIS — M6281 Muscle weakness (generalized): Secondary | ICD-10-CM | POA: Diagnosis not present

## 2015-12-03 DIAGNOSIS — R278 Other lack of coordination: Secondary | ICD-10-CM | POA: Diagnosis not present

## 2015-12-03 DIAGNOSIS — R1312 Dysphagia, oropharyngeal phase: Secondary | ICD-10-CM | POA: Diagnosis not present

## 2015-12-03 DIAGNOSIS — R2689 Other abnormalities of gait and mobility: Secondary | ICD-10-CM | POA: Diagnosis not present

## 2015-12-03 DIAGNOSIS — R41841 Cognitive communication deficit: Secondary | ICD-10-CM | POA: Diagnosis not present

## 2015-12-03 DIAGNOSIS — M6281 Muscle weakness (generalized): Secondary | ICD-10-CM | POA: Diagnosis not present

## 2015-12-05 DIAGNOSIS — R2689 Other abnormalities of gait and mobility: Secondary | ICD-10-CM | POA: Diagnosis not present

## 2015-12-05 DIAGNOSIS — R41841 Cognitive communication deficit: Secondary | ICD-10-CM | POA: Diagnosis not present

## 2015-12-05 DIAGNOSIS — R278 Other lack of coordination: Secondary | ICD-10-CM | POA: Diagnosis not present

## 2015-12-05 DIAGNOSIS — M6281 Muscle weakness (generalized): Secondary | ICD-10-CM | POA: Diagnosis not present

## 2015-12-05 DIAGNOSIS — R1312 Dysphagia, oropharyngeal phase: Secondary | ICD-10-CM | POA: Diagnosis not present

## 2015-12-10 DIAGNOSIS — R1312 Dysphagia, oropharyngeal phase: Secondary | ICD-10-CM | POA: Diagnosis not present

## 2015-12-10 DIAGNOSIS — R278 Other lack of coordination: Secondary | ICD-10-CM | POA: Diagnosis not present

## 2015-12-10 DIAGNOSIS — R2689 Other abnormalities of gait and mobility: Secondary | ICD-10-CM | POA: Diagnosis not present

## 2015-12-10 DIAGNOSIS — R41841 Cognitive communication deficit: Secondary | ICD-10-CM | POA: Diagnosis not present

## 2015-12-10 DIAGNOSIS — M6281 Muscle weakness (generalized): Secondary | ICD-10-CM | POA: Diagnosis not present

## 2015-12-15 DIAGNOSIS — I69151 Hemiplegia and hemiparesis following nontraumatic intracerebral hemorrhage affecting right dominant side: Secondary | ICD-10-CM | POA: Diagnosis not present

## 2015-12-15 DIAGNOSIS — R278 Other lack of coordination: Secondary | ICD-10-CM | POA: Diagnosis not present

## 2015-12-15 DIAGNOSIS — G8111 Spastic hemiplegia affecting right dominant side: Secondary | ICD-10-CM | POA: Diagnosis not present

## 2015-12-15 DIAGNOSIS — R2681 Unsteadiness on feet: Secondary | ICD-10-CM | POA: Diagnosis not present

## 2015-12-15 DIAGNOSIS — I69120 Aphasia following nontraumatic intracerebral hemorrhage: Secondary | ICD-10-CM | POA: Diagnosis not present

## 2015-12-15 DIAGNOSIS — M62561 Muscle wasting and atrophy, not elsewhere classified, right lower leg: Secondary | ICD-10-CM | POA: Diagnosis not present

## 2015-12-15 DIAGNOSIS — M6389 Disorders of muscle in diseases classified elsewhere, multiple sites: Secondary | ICD-10-CM | POA: Diagnosis not present

## 2015-12-15 DIAGNOSIS — R2689 Other abnormalities of gait and mobility: Secondary | ICD-10-CM | POA: Diagnosis not present

## 2015-12-15 DIAGNOSIS — G8191 Hemiplegia, unspecified affecting right dominant side: Secondary | ICD-10-CM | POA: Diagnosis not present

## 2015-12-15 DIAGNOSIS — I613 Nontraumatic intracerebral hemorrhage in brain stem: Secondary | ICD-10-CM | POA: Diagnosis not present

## 2015-12-15 DIAGNOSIS — R601 Generalized edema: Secondary | ICD-10-CM | POA: Diagnosis not present

## 2015-12-16 ENCOUNTER — Other Ambulatory Visit: Payer: Self-pay | Admitting: Neurology

## 2015-12-16 ENCOUNTER — Telehealth: Payer: Self-pay | Admitting: Neurology

## 2015-12-16 DIAGNOSIS — R278 Other lack of coordination: Secondary | ICD-10-CM | POA: Diagnosis not present

## 2015-12-16 DIAGNOSIS — R601 Generalized edema: Secondary | ICD-10-CM | POA: Diagnosis not present

## 2015-12-16 DIAGNOSIS — R2689 Other abnormalities of gait and mobility: Secondary | ICD-10-CM | POA: Diagnosis not present

## 2015-12-16 DIAGNOSIS — I69120 Aphasia following nontraumatic intracerebral hemorrhage: Secondary | ICD-10-CM | POA: Diagnosis not present

## 2015-12-16 DIAGNOSIS — M6389 Disorders of muscle in diseases classified elsewhere, multiple sites: Secondary | ICD-10-CM | POA: Diagnosis not present

## 2015-12-16 DIAGNOSIS — M62561 Muscle wasting and atrophy, not elsewhere classified, right lower leg: Secondary | ICD-10-CM | POA: Diagnosis not present

## 2015-12-16 MED ORDER — GABAPENTIN 100 MG PO CAPS: 200.0000 mg | ORAL_CAPSULE | Freq: Three times a day (TID) | ORAL | 3 refills | Status: DC

## 2015-12-16 NOTE — Telephone Encounter (Signed)
Dr. Genelle Bal called requesting to speak to Dr. Leonie Man about wife's medication, possibly increasing one of the medications gabapentin (NEURONTIN) 100 MG capsule or baclofen (LIORESAL) 10 MG tablet. Please call [phone].

## 2015-12-16 NOTE — Telephone Encounter (Signed)
I returned his call and he stated that patient was still having daily spasms and gabapentin was helping and she was tolerating it well so I recommended we increase the dose to 200 mg 3 times daily gradually over the next 9 days. He voiced understanding

## 2015-12-17 DIAGNOSIS — I69120 Aphasia following nontraumatic intracerebral hemorrhage: Secondary | ICD-10-CM | POA: Diagnosis not present

## 2015-12-17 DIAGNOSIS — R2689 Other abnormalities of gait and mobility: Secondary | ICD-10-CM | POA: Diagnosis not present

## 2015-12-17 DIAGNOSIS — R278 Other lack of coordination: Secondary | ICD-10-CM | POA: Diagnosis not present

## 2015-12-17 DIAGNOSIS — M6389 Disorders of muscle in diseases classified elsewhere, multiple sites: Secondary | ICD-10-CM | POA: Diagnosis not present

## 2015-12-17 DIAGNOSIS — M62561 Muscle wasting and atrophy, not elsewhere classified, right lower leg: Secondary | ICD-10-CM | POA: Diagnosis not present

## 2015-12-17 DIAGNOSIS — R601 Generalized edema: Secondary | ICD-10-CM | POA: Diagnosis not present

## 2015-12-18 DIAGNOSIS — I69120 Aphasia following nontraumatic intracerebral hemorrhage: Secondary | ICD-10-CM | POA: Diagnosis not present

## 2015-12-18 DIAGNOSIS — M62561 Muscle wasting and atrophy, not elsewhere classified, right lower leg: Secondary | ICD-10-CM | POA: Diagnosis not present

## 2015-12-18 DIAGNOSIS — M6389 Disorders of muscle in diseases classified elsewhere, multiple sites: Secondary | ICD-10-CM | POA: Diagnosis not present

## 2015-12-18 DIAGNOSIS — R278 Other lack of coordination: Secondary | ICD-10-CM | POA: Diagnosis not present

## 2015-12-18 DIAGNOSIS — R601 Generalized edema: Secondary | ICD-10-CM | POA: Diagnosis not present

## 2015-12-18 DIAGNOSIS — R2689 Other abnormalities of gait and mobility: Secondary | ICD-10-CM | POA: Diagnosis not present

## 2015-12-19 DIAGNOSIS — G8111 Spastic hemiplegia affecting right dominant side: Secondary | ICD-10-CM | POA: Diagnosis not present

## 2015-12-19 DIAGNOSIS — J45909 Unspecified asthma, uncomplicated: Secondary | ICD-10-CM | POA: Diagnosis not present

## 2015-12-19 DIAGNOSIS — R2689 Other abnormalities of gait and mobility: Secondary | ICD-10-CM | POA: Diagnosis not present

## 2015-12-19 DIAGNOSIS — Z681 Body mass index (BMI) 19 or less, adult: Secondary | ICD-10-CM | POA: Diagnosis not present

## 2015-12-19 DIAGNOSIS — M6389 Disorders of muscle in diseases classified elsewhere, multiple sites: Secondary | ICD-10-CM | POA: Diagnosis not present

## 2015-12-19 DIAGNOSIS — I10 Essential (primary) hypertension: Secondary | ICD-10-CM | POA: Diagnosis not present

## 2015-12-19 DIAGNOSIS — E784 Other hyperlipidemia: Secondary | ICD-10-CM | POA: Diagnosis not present

## 2015-12-19 DIAGNOSIS — I638 Other cerebral infarction: Secondary | ICD-10-CM | POA: Diagnosis not present

## 2015-12-19 DIAGNOSIS — I69359 Hemiplegia and hemiparesis following cerebral infarction affecting unspecified side: Secondary | ICD-10-CM | POA: Diagnosis not present

## 2015-12-19 DIAGNOSIS — R2681 Unsteadiness on feet: Secondary | ICD-10-CM | POA: Diagnosis not present

## 2015-12-19 DIAGNOSIS — R601 Generalized edema: Secondary | ICD-10-CM | POA: Diagnosis not present

## 2015-12-19 DIAGNOSIS — I69151 Hemiplegia and hemiparesis following nontraumatic intracerebral hemorrhage affecting right dominant side: Secondary | ICD-10-CM | POA: Diagnosis not present

## 2015-12-19 DIAGNOSIS — I613 Nontraumatic intracerebral hemorrhage in brain stem: Secondary | ICD-10-CM | POA: Diagnosis not present

## 2015-12-19 DIAGNOSIS — R278 Other lack of coordination: Secondary | ICD-10-CM | POA: Diagnosis not present

## 2015-12-19 DIAGNOSIS — I69120 Aphasia following nontraumatic intracerebral hemorrhage: Secondary | ICD-10-CM | POA: Diagnosis not present

## 2015-12-19 DIAGNOSIS — M62561 Muscle wasting and atrophy, not elsewhere classified, right lower leg: Secondary | ICD-10-CM | POA: Diagnosis not present

## 2015-12-19 DIAGNOSIS — I350 Nonrheumatic aortic (valve) stenosis: Secondary | ICD-10-CM | POA: Diagnosis not present

## 2015-12-19 DIAGNOSIS — G8191 Hemiplegia, unspecified affecting right dominant side: Secondary | ICD-10-CM | POA: Diagnosis not present

## 2015-12-22 DIAGNOSIS — M6389 Disorders of muscle in diseases classified elsewhere, multiple sites: Secondary | ICD-10-CM | POA: Diagnosis not present

## 2015-12-22 DIAGNOSIS — I69120 Aphasia following nontraumatic intracerebral hemorrhage: Secondary | ICD-10-CM | POA: Diagnosis not present

## 2015-12-22 DIAGNOSIS — M62561 Muscle wasting and atrophy, not elsewhere classified, right lower leg: Secondary | ICD-10-CM | POA: Diagnosis not present

## 2015-12-22 DIAGNOSIS — I69151 Hemiplegia and hemiparesis following nontraumatic intracerebral hemorrhage affecting right dominant side: Secondary | ICD-10-CM | POA: Diagnosis not present

## 2015-12-22 DIAGNOSIS — G8111 Spastic hemiplegia affecting right dominant side: Secondary | ICD-10-CM | POA: Diagnosis not present

## 2015-12-22 DIAGNOSIS — R2689 Other abnormalities of gait and mobility: Secondary | ICD-10-CM | POA: Diagnosis not present

## 2015-12-23 DIAGNOSIS — M6389 Disorders of muscle in diseases classified elsewhere, multiple sites: Secondary | ICD-10-CM | POA: Diagnosis not present

## 2015-12-23 DIAGNOSIS — R2689 Other abnormalities of gait and mobility: Secondary | ICD-10-CM | POA: Diagnosis not present

## 2015-12-23 DIAGNOSIS — I69120 Aphasia following nontraumatic intracerebral hemorrhage: Secondary | ICD-10-CM | POA: Diagnosis not present

## 2015-12-23 DIAGNOSIS — M62561 Muscle wasting and atrophy, not elsewhere classified, right lower leg: Secondary | ICD-10-CM | POA: Diagnosis not present

## 2015-12-23 DIAGNOSIS — I69151 Hemiplegia and hemiparesis following nontraumatic intracerebral hemorrhage affecting right dominant side: Secondary | ICD-10-CM | POA: Diagnosis not present

## 2015-12-23 DIAGNOSIS — G8111 Spastic hemiplegia affecting right dominant side: Secondary | ICD-10-CM | POA: Diagnosis not present

## 2015-12-24 DIAGNOSIS — M6389 Disorders of muscle in diseases classified elsewhere, multiple sites: Secondary | ICD-10-CM | POA: Diagnosis not present

## 2015-12-24 DIAGNOSIS — I10 Essential (primary) hypertension: Secondary | ICD-10-CM | POA: Diagnosis not present

## 2015-12-24 DIAGNOSIS — M62561 Muscle wasting and atrophy, not elsewhere classified, right lower leg: Secondary | ICD-10-CM | POA: Diagnosis not present

## 2015-12-24 DIAGNOSIS — I350 Nonrheumatic aortic (valve) stenosis: Secondary | ICD-10-CM | POA: Diagnosis not present

## 2015-12-24 DIAGNOSIS — R2689 Other abnormalities of gait and mobility: Secondary | ICD-10-CM | POA: Diagnosis not present

## 2015-12-24 DIAGNOSIS — G8111 Spastic hemiplegia affecting right dominant side: Secondary | ICD-10-CM | POA: Diagnosis not present

## 2015-12-24 DIAGNOSIS — Z23 Encounter for immunization: Secondary | ICD-10-CM | POA: Diagnosis not present

## 2015-12-24 DIAGNOSIS — I69359 Hemiplegia and hemiparesis following cerebral infarction affecting unspecified side: Secondary | ICD-10-CM | POA: Diagnosis not present

## 2015-12-24 DIAGNOSIS — Z681 Body mass index (BMI) 19 or less, adult: Secondary | ICD-10-CM | POA: Diagnosis not present

## 2015-12-24 DIAGNOSIS — E784 Other hyperlipidemia: Secondary | ICD-10-CM | POA: Diagnosis not present

## 2015-12-24 DIAGNOSIS — I69151 Hemiplegia and hemiparesis following nontraumatic intracerebral hemorrhage affecting right dominant side: Secondary | ICD-10-CM | POA: Diagnosis not present

## 2015-12-24 DIAGNOSIS — I69120 Aphasia following nontraumatic intracerebral hemorrhage: Secondary | ICD-10-CM | POA: Diagnosis not present

## 2015-12-25 DIAGNOSIS — I69151 Hemiplegia and hemiparesis following nontraumatic intracerebral hemorrhage affecting right dominant side: Secondary | ICD-10-CM | POA: Diagnosis not present

## 2015-12-25 DIAGNOSIS — G8111 Spastic hemiplegia affecting right dominant side: Secondary | ICD-10-CM | POA: Diagnosis not present

## 2015-12-25 DIAGNOSIS — M6389 Disorders of muscle in diseases classified elsewhere, multiple sites: Secondary | ICD-10-CM | POA: Diagnosis not present

## 2015-12-25 DIAGNOSIS — I69120 Aphasia following nontraumatic intracerebral hemorrhage: Secondary | ICD-10-CM | POA: Diagnosis not present

## 2015-12-25 DIAGNOSIS — R2689 Other abnormalities of gait and mobility: Secondary | ICD-10-CM | POA: Diagnosis not present

## 2015-12-25 DIAGNOSIS — M62561 Muscle wasting and atrophy, not elsewhere classified, right lower leg: Secondary | ICD-10-CM | POA: Diagnosis not present

## 2015-12-26 DIAGNOSIS — M62561 Muscle wasting and atrophy, not elsewhere classified, right lower leg: Secondary | ICD-10-CM | POA: Diagnosis not present

## 2015-12-26 DIAGNOSIS — I69151 Hemiplegia and hemiparesis following nontraumatic intracerebral hemorrhage affecting right dominant side: Secondary | ICD-10-CM | POA: Diagnosis not present

## 2015-12-26 DIAGNOSIS — G8111 Spastic hemiplegia affecting right dominant side: Secondary | ICD-10-CM | POA: Diagnosis not present

## 2015-12-26 DIAGNOSIS — R2689 Other abnormalities of gait and mobility: Secondary | ICD-10-CM | POA: Diagnosis not present

## 2015-12-26 DIAGNOSIS — M6389 Disorders of muscle in diseases classified elsewhere, multiple sites: Secondary | ICD-10-CM | POA: Diagnosis not present

## 2015-12-26 DIAGNOSIS — I69120 Aphasia following nontraumatic intracerebral hemorrhage: Secondary | ICD-10-CM | POA: Diagnosis not present

## 2015-12-29 DIAGNOSIS — R2689 Other abnormalities of gait and mobility: Secondary | ICD-10-CM | POA: Diagnosis not present

## 2015-12-29 DIAGNOSIS — I69151 Hemiplegia and hemiparesis following nontraumatic intracerebral hemorrhage affecting right dominant side: Secondary | ICD-10-CM | POA: Diagnosis not present

## 2015-12-29 DIAGNOSIS — M6389 Disorders of muscle in diseases classified elsewhere, multiple sites: Secondary | ICD-10-CM | POA: Diagnosis not present

## 2015-12-29 DIAGNOSIS — G8111 Spastic hemiplegia affecting right dominant side: Secondary | ICD-10-CM | POA: Diagnosis not present

## 2015-12-29 DIAGNOSIS — I69120 Aphasia following nontraumatic intracerebral hemorrhage: Secondary | ICD-10-CM | POA: Diagnosis not present

## 2015-12-29 DIAGNOSIS — M62561 Muscle wasting and atrophy, not elsewhere classified, right lower leg: Secondary | ICD-10-CM | POA: Diagnosis not present

## 2015-12-30 DIAGNOSIS — I69151 Hemiplegia and hemiparesis following nontraumatic intracerebral hemorrhage affecting right dominant side: Secondary | ICD-10-CM | POA: Diagnosis not present

## 2015-12-30 DIAGNOSIS — M62561 Muscle wasting and atrophy, not elsewhere classified, right lower leg: Secondary | ICD-10-CM | POA: Diagnosis not present

## 2015-12-30 DIAGNOSIS — G8111 Spastic hemiplegia affecting right dominant side: Secondary | ICD-10-CM | POA: Diagnosis not present

## 2015-12-30 DIAGNOSIS — I69120 Aphasia following nontraumatic intracerebral hemorrhage: Secondary | ICD-10-CM | POA: Diagnosis not present

## 2015-12-30 DIAGNOSIS — R2689 Other abnormalities of gait and mobility: Secondary | ICD-10-CM | POA: Diagnosis not present

## 2015-12-30 DIAGNOSIS — M6389 Disorders of muscle in diseases classified elsewhere, multiple sites: Secondary | ICD-10-CM | POA: Diagnosis not present

## 2015-12-31 DIAGNOSIS — R2689 Other abnormalities of gait and mobility: Secondary | ICD-10-CM | POA: Diagnosis not present

## 2015-12-31 DIAGNOSIS — M6389 Disorders of muscle in diseases classified elsewhere, multiple sites: Secondary | ICD-10-CM | POA: Diagnosis not present

## 2015-12-31 DIAGNOSIS — I69151 Hemiplegia and hemiparesis following nontraumatic intracerebral hemorrhage affecting right dominant side: Secondary | ICD-10-CM | POA: Diagnosis not present

## 2015-12-31 DIAGNOSIS — G8111 Spastic hemiplegia affecting right dominant side: Secondary | ICD-10-CM | POA: Diagnosis not present

## 2015-12-31 DIAGNOSIS — M62561 Muscle wasting and atrophy, not elsewhere classified, right lower leg: Secondary | ICD-10-CM | POA: Diagnosis not present

## 2015-12-31 DIAGNOSIS — I69120 Aphasia following nontraumatic intracerebral hemorrhage: Secondary | ICD-10-CM | POA: Diagnosis not present

## 2016-01-01 DIAGNOSIS — G8111 Spastic hemiplegia affecting right dominant side: Secondary | ICD-10-CM | POA: Diagnosis not present

## 2016-01-01 DIAGNOSIS — M62561 Muscle wasting and atrophy, not elsewhere classified, right lower leg: Secondary | ICD-10-CM | POA: Diagnosis not present

## 2016-01-01 DIAGNOSIS — M6389 Disorders of muscle in diseases classified elsewhere, multiple sites: Secondary | ICD-10-CM | POA: Diagnosis not present

## 2016-01-01 DIAGNOSIS — I69151 Hemiplegia and hemiparesis following nontraumatic intracerebral hemorrhage affecting right dominant side: Secondary | ICD-10-CM | POA: Diagnosis not present

## 2016-01-01 DIAGNOSIS — R2689 Other abnormalities of gait and mobility: Secondary | ICD-10-CM | POA: Diagnosis not present

## 2016-01-01 DIAGNOSIS — I69120 Aphasia following nontraumatic intracerebral hemorrhage: Secondary | ICD-10-CM | POA: Diagnosis not present

## 2016-01-02 DIAGNOSIS — M62561 Muscle wasting and atrophy, not elsewhere classified, right lower leg: Secondary | ICD-10-CM | POA: Diagnosis not present

## 2016-01-02 DIAGNOSIS — R2689 Other abnormalities of gait and mobility: Secondary | ICD-10-CM | POA: Diagnosis not present

## 2016-01-02 DIAGNOSIS — G8111 Spastic hemiplegia affecting right dominant side: Secondary | ICD-10-CM | POA: Diagnosis not present

## 2016-01-02 DIAGNOSIS — I69151 Hemiplegia and hemiparesis following nontraumatic intracerebral hemorrhage affecting right dominant side: Secondary | ICD-10-CM | POA: Diagnosis not present

## 2016-01-02 DIAGNOSIS — M6389 Disorders of muscle in diseases classified elsewhere, multiple sites: Secondary | ICD-10-CM | POA: Diagnosis not present

## 2016-01-02 DIAGNOSIS — I69120 Aphasia following nontraumatic intracerebral hemorrhage: Secondary | ICD-10-CM | POA: Diagnosis not present

## 2016-01-05 DIAGNOSIS — M6389 Disorders of muscle in diseases classified elsewhere, multiple sites: Secondary | ICD-10-CM | POA: Diagnosis not present

## 2016-01-05 DIAGNOSIS — I69151 Hemiplegia and hemiparesis following nontraumatic intracerebral hemorrhage affecting right dominant side: Secondary | ICD-10-CM | POA: Diagnosis not present

## 2016-01-05 DIAGNOSIS — G8111 Spastic hemiplegia affecting right dominant side: Secondary | ICD-10-CM | POA: Diagnosis not present

## 2016-01-05 DIAGNOSIS — R2689 Other abnormalities of gait and mobility: Secondary | ICD-10-CM | POA: Diagnosis not present

## 2016-01-05 DIAGNOSIS — M62561 Muscle wasting and atrophy, not elsewhere classified, right lower leg: Secondary | ICD-10-CM | POA: Diagnosis not present

## 2016-01-05 DIAGNOSIS — I69120 Aphasia following nontraumatic intracerebral hemorrhage: Secondary | ICD-10-CM | POA: Diagnosis not present

## 2016-01-06 DIAGNOSIS — G8111 Spastic hemiplegia affecting right dominant side: Secondary | ICD-10-CM | POA: Diagnosis not present

## 2016-01-06 DIAGNOSIS — M6389 Disorders of muscle in diseases classified elsewhere, multiple sites: Secondary | ICD-10-CM | POA: Diagnosis not present

## 2016-01-06 DIAGNOSIS — I69120 Aphasia following nontraumatic intracerebral hemorrhage: Secondary | ICD-10-CM | POA: Diagnosis not present

## 2016-01-06 DIAGNOSIS — I69151 Hemiplegia and hemiparesis following nontraumatic intracerebral hemorrhage affecting right dominant side: Secondary | ICD-10-CM | POA: Diagnosis not present

## 2016-01-06 DIAGNOSIS — M62561 Muscle wasting and atrophy, not elsewhere classified, right lower leg: Secondary | ICD-10-CM | POA: Diagnosis not present

## 2016-01-06 DIAGNOSIS — R2689 Other abnormalities of gait and mobility: Secondary | ICD-10-CM | POA: Diagnosis not present

## 2016-01-07 DIAGNOSIS — G8111 Spastic hemiplegia affecting right dominant side: Secondary | ICD-10-CM | POA: Diagnosis not present

## 2016-01-07 DIAGNOSIS — M6389 Disorders of muscle in diseases classified elsewhere, multiple sites: Secondary | ICD-10-CM | POA: Diagnosis not present

## 2016-01-07 DIAGNOSIS — I69151 Hemiplegia and hemiparesis following nontraumatic intracerebral hemorrhage affecting right dominant side: Secondary | ICD-10-CM | POA: Diagnosis not present

## 2016-01-07 DIAGNOSIS — R2689 Other abnormalities of gait and mobility: Secondary | ICD-10-CM | POA: Diagnosis not present

## 2016-01-07 DIAGNOSIS — M62561 Muscle wasting and atrophy, not elsewhere classified, right lower leg: Secondary | ICD-10-CM | POA: Diagnosis not present

## 2016-01-07 DIAGNOSIS — I69120 Aphasia following nontraumatic intracerebral hemorrhage: Secondary | ICD-10-CM | POA: Diagnosis not present

## 2016-01-08 DIAGNOSIS — M6389 Disorders of muscle in diseases classified elsewhere, multiple sites: Secondary | ICD-10-CM | POA: Diagnosis not present

## 2016-01-08 DIAGNOSIS — I69151 Hemiplegia and hemiparesis following nontraumatic intracerebral hemorrhage affecting right dominant side: Secondary | ICD-10-CM | POA: Diagnosis not present

## 2016-01-08 DIAGNOSIS — G8111 Spastic hemiplegia affecting right dominant side: Secondary | ICD-10-CM | POA: Diagnosis not present

## 2016-01-08 DIAGNOSIS — R2689 Other abnormalities of gait and mobility: Secondary | ICD-10-CM | POA: Diagnosis not present

## 2016-01-08 DIAGNOSIS — I69120 Aphasia following nontraumatic intracerebral hemorrhage: Secondary | ICD-10-CM | POA: Diagnosis not present

## 2016-01-08 DIAGNOSIS — M62561 Muscle wasting and atrophy, not elsewhere classified, right lower leg: Secondary | ICD-10-CM | POA: Diagnosis not present

## 2016-01-09 DIAGNOSIS — I69151 Hemiplegia and hemiparesis following nontraumatic intracerebral hemorrhage affecting right dominant side: Secondary | ICD-10-CM | POA: Diagnosis not present

## 2016-01-09 DIAGNOSIS — R2689 Other abnormalities of gait and mobility: Secondary | ICD-10-CM | POA: Diagnosis not present

## 2016-01-09 DIAGNOSIS — M62561 Muscle wasting and atrophy, not elsewhere classified, right lower leg: Secondary | ICD-10-CM | POA: Diagnosis not present

## 2016-01-09 DIAGNOSIS — G8111 Spastic hemiplegia affecting right dominant side: Secondary | ICD-10-CM | POA: Diagnosis not present

## 2016-01-09 DIAGNOSIS — I69120 Aphasia following nontraumatic intracerebral hemorrhage: Secondary | ICD-10-CM | POA: Diagnosis not present

## 2016-01-09 DIAGNOSIS — M6389 Disorders of muscle in diseases classified elsewhere, multiple sites: Secondary | ICD-10-CM | POA: Diagnosis not present

## 2016-01-12 DIAGNOSIS — I69120 Aphasia following nontraumatic intracerebral hemorrhage: Secondary | ICD-10-CM | POA: Diagnosis not present

## 2016-01-12 DIAGNOSIS — M62561 Muscle wasting and atrophy, not elsewhere classified, right lower leg: Secondary | ICD-10-CM | POA: Diagnosis not present

## 2016-01-12 DIAGNOSIS — R2689 Other abnormalities of gait and mobility: Secondary | ICD-10-CM | POA: Diagnosis not present

## 2016-01-12 DIAGNOSIS — M6389 Disorders of muscle in diseases classified elsewhere, multiple sites: Secondary | ICD-10-CM | POA: Diagnosis not present

## 2016-01-12 DIAGNOSIS — I69151 Hemiplegia and hemiparesis following nontraumatic intracerebral hemorrhage affecting right dominant side: Secondary | ICD-10-CM | POA: Diagnosis not present

## 2016-01-12 DIAGNOSIS — G8111 Spastic hemiplegia affecting right dominant side: Secondary | ICD-10-CM | POA: Diagnosis not present

## 2016-01-13 DIAGNOSIS — M6389 Disorders of muscle in diseases classified elsewhere, multiple sites: Secondary | ICD-10-CM | POA: Diagnosis not present

## 2016-01-13 DIAGNOSIS — R2689 Other abnormalities of gait and mobility: Secondary | ICD-10-CM | POA: Diagnosis not present

## 2016-01-13 DIAGNOSIS — I69151 Hemiplegia and hemiparesis following nontraumatic intracerebral hemorrhage affecting right dominant side: Secondary | ICD-10-CM | POA: Diagnosis not present

## 2016-01-13 DIAGNOSIS — M62561 Muscle wasting and atrophy, not elsewhere classified, right lower leg: Secondary | ICD-10-CM | POA: Diagnosis not present

## 2016-01-13 DIAGNOSIS — I69120 Aphasia following nontraumatic intracerebral hemorrhage: Secondary | ICD-10-CM | POA: Diagnosis not present

## 2016-01-13 DIAGNOSIS — G8111 Spastic hemiplegia affecting right dominant side: Secondary | ICD-10-CM | POA: Diagnosis not present

## 2016-01-14 DIAGNOSIS — M62561 Muscle wasting and atrophy, not elsewhere classified, right lower leg: Secondary | ICD-10-CM | POA: Diagnosis not present

## 2016-01-14 DIAGNOSIS — G8111 Spastic hemiplegia affecting right dominant side: Secondary | ICD-10-CM | POA: Diagnosis not present

## 2016-01-14 DIAGNOSIS — I69120 Aphasia following nontraumatic intracerebral hemorrhage: Secondary | ICD-10-CM | POA: Diagnosis not present

## 2016-01-14 DIAGNOSIS — M6389 Disorders of muscle in diseases classified elsewhere, multiple sites: Secondary | ICD-10-CM | POA: Diagnosis not present

## 2016-01-14 DIAGNOSIS — I69151 Hemiplegia and hemiparesis following nontraumatic intracerebral hemorrhage affecting right dominant side: Secondary | ICD-10-CM | POA: Diagnosis not present

## 2016-01-14 DIAGNOSIS — R2689 Other abnormalities of gait and mobility: Secondary | ICD-10-CM | POA: Diagnosis not present

## 2016-01-15 DIAGNOSIS — M6389 Disorders of muscle in diseases classified elsewhere, multiple sites: Secondary | ICD-10-CM | POA: Diagnosis not present

## 2016-01-15 DIAGNOSIS — I69120 Aphasia following nontraumatic intracerebral hemorrhage: Secondary | ICD-10-CM | POA: Diagnosis not present

## 2016-01-15 DIAGNOSIS — R2689 Other abnormalities of gait and mobility: Secondary | ICD-10-CM | POA: Diagnosis not present

## 2016-01-15 DIAGNOSIS — G8111 Spastic hemiplegia affecting right dominant side: Secondary | ICD-10-CM | POA: Diagnosis not present

## 2016-01-15 DIAGNOSIS — I69151 Hemiplegia and hemiparesis following nontraumatic intracerebral hemorrhage affecting right dominant side: Secondary | ICD-10-CM | POA: Diagnosis not present

## 2016-01-15 DIAGNOSIS — M62561 Muscle wasting and atrophy, not elsewhere classified, right lower leg: Secondary | ICD-10-CM | POA: Diagnosis not present

## 2016-01-16 DIAGNOSIS — R2689 Other abnormalities of gait and mobility: Secondary | ICD-10-CM | POA: Diagnosis not present

## 2016-01-16 DIAGNOSIS — I69151 Hemiplegia and hemiparesis following nontraumatic intracerebral hemorrhage affecting right dominant side: Secondary | ICD-10-CM | POA: Diagnosis not present

## 2016-01-16 DIAGNOSIS — M6389 Disorders of muscle in diseases classified elsewhere, multiple sites: Secondary | ICD-10-CM | POA: Diagnosis not present

## 2016-01-16 DIAGNOSIS — G8111 Spastic hemiplegia affecting right dominant side: Secondary | ICD-10-CM | POA: Diagnosis not present

## 2016-01-16 DIAGNOSIS — I69120 Aphasia following nontraumatic intracerebral hemorrhage: Secondary | ICD-10-CM | POA: Diagnosis not present

## 2016-01-16 DIAGNOSIS — M62561 Muscle wasting and atrophy, not elsewhere classified, right lower leg: Secondary | ICD-10-CM | POA: Diagnosis not present

## 2016-01-19 ENCOUNTER — Ambulatory Visit: Payer: Medicare Other | Admitting: Physical Medicine & Rehabilitation

## 2016-01-19 DIAGNOSIS — R278 Other lack of coordination: Secondary | ICD-10-CM | POA: Diagnosis not present

## 2016-01-19 DIAGNOSIS — R2689 Other abnormalities of gait and mobility: Secondary | ICD-10-CM | POA: Diagnosis not present

## 2016-01-19 DIAGNOSIS — I613 Nontraumatic intracerebral hemorrhage in brain stem: Secondary | ICD-10-CM | POA: Diagnosis not present

## 2016-01-19 DIAGNOSIS — G8111 Spastic hemiplegia affecting right dominant side: Secondary | ICD-10-CM | POA: Diagnosis not present

## 2016-01-19 DIAGNOSIS — M62561 Muscle wasting and atrophy, not elsewhere classified, right lower leg: Secondary | ICD-10-CM | POA: Diagnosis not present

## 2016-01-19 DIAGNOSIS — R601 Generalized edema: Secondary | ICD-10-CM | POA: Diagnosis not present

## 2016-01-19 DIAGNOSIS — I69120 Aphasia following nontraumatic intracerebral hemorrhage: Secondary | ICD-10-CM | POA: Diagnosis not present

## 2016-01-19 DIAGNOSIS — M6389 Disorders of muscle in diseases classified elsewhere, multiple sites: Secondary | ICD-10-CM | POA: Diagnosis not present

## 2016-01-19 DIAGNOSIS — I69151 Hemiplegia and hemiparesis following nontraumatic intracerebral hemorrhage affecting right dominant side: Secondary | ICD-10-CM | POA: Diagnosis not present

## 2016-01-19 DIAGNOSIS — R2681 Unsteadiness on feet: Secondary | ICD-10-CM | POA: Diagnosis not present

## 2016-01-19 DIAGNOSIS — G8191 Hemiplegia, unspecified affecting right dominant side: Secondary | ICD-10-CM | POA: Diagnosis not present

## 2016-01-20 DIAGNOSIS — I69120 Aphasia following nontraumatic intracerebral hemorrhage: Secondary | ICD-10-CM | POA: Diagnosis not present

## 2016-01-20 DIAGNOSIS — M6389 Disorders of muscle in diseases classified elsewhere, multiple sites: Secondary | ICD-10-CM | POA: Diagnosis not present

## 2016-01-20 DIAGNOSIS — I69151 Hemiplegia and hemiparesis following nontraumatic intracerebral hemorrhage affecting right dominant side: Secondary | ICD-10-CM | POA: Diagnosis not present

## 2016-01-20 DIAGNOSIS — G8111 Spastic hemiplegia affecting right dominant side: Secondary | ICD-10-CM | POA: Diagnosis not present

## 2016-01-20 DIAGNOSIS — M62561 Muscle wasting and atrophy, not elsewhere classified, right lower leg: Secondary | ICD-10-CM | POA: Diagnosis not present

## 2016-01-20 DIAGNOSIS — R2689 Other abnormalities of gait and mobility: Secondary | ICD-10-CM | POA: Diagnosis not present

## 2016-01-21 DIAGNOSIS — R2689 Other abnormalities of gait and mobility: Secondary | ICD-10-CM | POA: Diagnosis not present

## 2016-01-21 DIAGNOSIS — I69120 Aphasia following nontraumatic intracerebral hemorrhage: Secondary | ICD-10-CM | POA: Diagnosis not present

## 2016-01-21 DIAGNOSIS — M62561 Muscle wasting and atrophy, not elsewhere classified, right lower leg: Secondary | ICD-10-CM | POA: Diagnosis not present

## 2016-01-21 DIAGNOSIS — G8111 Spastic hemiplegia affecting right dominant side: Secondary | ICD-10-CM | POA: Diagnosis not present

## 2016-01-21 DIAGNOSIS — M6389 Disorders of muscle in diseases classified elsewhere, multiple sites: Secondary | ICD-10-CM | POA: Diagnosis not present

## 2016-01-21 DIAGNOSIS — I69151 Hemiplegia and hemiparesis following nontraumatic intracerebral hemorrhage affecting right dominant side: Secondary | ICD-10-CM | POA: Diagnosis not present

## 2016-01-22 DIAGNOSIS — R2689 Other abnormalities of gait and mobility: Secondary | ICD-10-CM | POA: Diagnosis not present

## 2016-01-22 DIAGNOSIS — M6389 Disorders of muscle in diseases classified elsewhere, multiple sites: Secondary | ICD-10-CM | POA: Diagnosis not present

## 2016-01-22 DIAGNOSIS — I69151 Hemiplegia and hemiparesis following nontraumatic intracerebral hemorrhage affecting right dominant side: Secondary | ICD-10-CM | POA: Diagnosis not present

## 2016-01-22 DIAGNOSIS — I69120 Aphasia following nontraumatic intracerebral hemorrhage: Secondary | ICD-10-CM | POA: Diagnosis not present

## 2016-01-22 DIAGNOSIS — M62561 Muscle wasting and atrophy, not elsewhere classified, right lower leg: Secondary | ICD-10-CM | POA: Diagnosis not present

## 2016-01-22 DIAGNOSIS — G8111 Spastic hemiplegia affecting right dominant side: Secondary | ICD-10-CM | POA: Diagnosis not present

## 2016-01-23 DIAGNOSIS — I69151 Hemiplegia and hemiparesis following nontraumatic intracerebral hemorrhage affecting right dominant side: Secondary | ICD-10-CM | POA: Diagnosis not present

## 2016-01-23 DIAGNOSIS — G8111 Spastic hemiplegia affecting right dominant side: Secondary | ICD-10-CM | POA: Diagnosis not present

## 2016-01-23 DIAGNOSIS — M62561 Muscle wasting and atrophy, not elsewhere classified, right lower leg: Secondary | ICD-10-CM | POA: Diagnosis not present

## 2016-01-23 DIAGNOSIS — M6389 Disorders of muscle in diseases classified elsewhere, multiple sites: Secondary | ICD-10-CM | POA: Diagnosis not present

## 2016-01-23 DIAGNOSIS — I69120 Aphasia following nontraumatic intracerebral hemorrhage: Secondary | ICD-10-CM | POA: Diagnosis not present

## 2016-01-23 DIAGNOSIS — R2689 Other abnormalities of gait and mobility: Secondary | ICD-10-CM | POA: Diagnosis not present

## 2016-01-26 DIAGNOSIS — M6389 Disorders of muscle in diseases classified elsewhere, multiple sites: Secondary | ICD-10-CM | POA: Diagnosis not present

## 2016-01-26 DIAGNOSIS — R2689 Other abnormalities of gait and mobility: Secondary | ICD-10-CM | POA: Diagnosis not present

## 2016-01-26 DIAGNOSIS — I69120 Aphasia following nontraumatic intracerebral hemorrhage: Secondary | ICD-10-CM | POA: Diagnosis not present

## 2016-01-26 DIAGNOSIS — I69151 Hemiplegia and hemiparesis following nontraumatic intracerebral hemorrhage affecting right dominant side: Secondary | ICD-10-CM | POA: Diagnosis not present

## 2016-01-26 DIAGNOSIS — G8111 Spastic hemiplegia affecting right dominant side: Secondary | ICD-10-CM | POA: Diagnosis not present

## 2016-01-26 DIAGNOSIS — M62561 Muscle wasting and atrophy, not elsewhere classified, right lower leg: Secondary | ICD-10-CM | POA: Diagnosis not present

## 2016-01-27 ENCOUNTER — Encounter: Payer: Medicare Other | Attending: Physical Medicine & Rehabilitation | Admitting: Physical Medicine & Rehabilitation

## 2016-01-27 ENCOUNTER — Encounter: Payer: Self-pay | Admitting: Physical Medicine & Rehabilitation

## 2016-01-27 VITALS — BP 142/87 | HR 69 | Resp 14

## 2016-01-27 DIAGNOSIS — R200 Anesthesia of skin: Secondary | ICD-10-CM | POA: Insufficient documentation

## 2016-01-27 DIAGNOSIS — I610 Nontraumatic intracerebral hemorrhage in hemisphere, subcortical: Secondary | ICD-10-CM | POA: Diagnosis not present

## 2016-01-27 DIAGNOSIS — R252 Cramp and spasm: Secondary | ICD-10-CM | POA: Diagnosis not present

## 2016-01-27 DIAGNOSIS — I69251 Hemiplegia and hemiparesis following other nontraumatic intracranial hemorrhage affecting right dominant side: Secondary | ICD-10-CM | POA: Insufficient documentation

## 2016-01-27 DIAGNOSIS — G8110 Spastic hemiplegia affecting unspecified side: Secondary | ICD-10-CM

## 2016-01-27 DIAGNOSIS — Z5189 Encounter for other specified aftercare: Secondary | ICD-10-CM | POA: Diagnosis not present

## 2016-01-27 DIAGNOSIS — I10 Essential (primary) hypertension: Secondary | ICD-10-CM | POA: Insufficient documentation

## 2016-01-27 DIAGNOSIS — R2689 Other abnormalities of gait and mobility: Secondary | ICD-10-CM | POA: Diagnosis not present

## 2016-01-27 DIAGNOSIS — R531 Weakness: Secondary | ICD-10-CM | POA: Diagnosis not present

## 2016-01-27 DIAGNOSIS — R5383 Other fatigue: Secondary | ICD-10-CM | POA: Insufficient documentation

## 2016-01-27 DIAGNOSIS — E785 Hyperlipidemia, unspecified: Secondary | ICD-10-CM | POA: Insufficient documentation

## 2016-01-27 DIAGNOSIS — K589 Irritable bowel syndrome without diarrhea: Secondary | ICD-10-CM | POA: Diagnosis not present

## 2016-01-27 DIAGNOSIS — F329 Major depressive disorder, single episode, unspecified: Secondary | ICD-10-CM | POA: Diagnosis not present

## 2016-01-27 DIAGNOSIS — I69120 Aphasia following nontraumatic intracerebral hemorrhage: Secondary | ICD-10-CM | POA: Diagnosis not present

## 2016-01-27 DIAGNOSIS — I69191 Dysphagia following nontraumatic intracerebral hemorrhage: Secondary | ICD-10-CM | POA: Diagnosis not present

## 2016-01-27 DIAGNOSIS — G8111 Spastic hemiplegia affecting right dominant side: Secondary | ICD-10-CM | POA: Diagnosis not present

## 2016-01-27 DIAGNOSIS — I69151 Hemiplegia and hemiparesis following nontraumatic intracerebral hemorrhage affecting right dominant side: Secondary | ICD-10-CM | POA: Diagnosis not present

## 2016-01-27 DIAGNOSIS — M6389 Disorders of muscle in diseases classified elsewhere, multiple sites: Secondary | ICD-10-CM | POA: Diagnosis not present

## 2016-01-27 DIAGNOSIS — M62561 Muscle wasting and atrophy, not elsewhere classified, right lower leg: Secondary | ICD-10-CM | POA: Diagnosis not present

## 2016-01-27 NOTE — Progress Notes (Signed)
Subjective:    Patient ID: [name], female    DOB: 09-07-1940, 75 y.o.   MRN: [id number]  HPI   [name] is here in follow up of her left CVA and spastic hemiparesis. She is continuing to work with PT at [facility] as well as [facility]. She is making gains with mobility and language. She is having on going spasms and cramping in the right hand which often can be quite painful. She is using a day time and night time splint.   Husband asked about whether she mgiht be ready for stairs. PT has focused on gait and stability of right leg during gait. She has a knee brace and AFO which she uses.      Pain Inventory Average Pain 0 Pain Right Now 0 My pain is no pain  In the last 24 hours, has pain interfered with the following? General activity 0 Relation with others 0 Enjoyment of life 0 What TIME of day is your pain at its worst? no pain Sleep (in general) Good  Pain is worse with: no pain Pain improves with: no pain Relief from Meds: no pain  Mobility walk with assistance use a walker how many minutes can you walk? 25 ability to climb steps?  no do you drive?  no use a wheelchair Do you have any goals in this area?  yes  Function retired I need assistance with the following:    Do you have any goals in this area?  yes  Neuro/Psych bowel control problems trouble walking spasms  Prior Studies Any changes since last visit?  no  Physicians involved in your care Any changes since last visit?  no   Family History  Problem Relation Age of Onset  . Aortic aneurysm Father   . Heart failure Mother    Social History   Social History  . Marital status: Married    Spouse name: Dr. Genelle Bal  . Number of children: N/A  . Years of education: N/A   Social History Main Topics  . Smoking status: Never Smoker  . Smokeless tobacco: Never Used  . Alcohol use 0.6 oz/week    1 Glasses of wine per week     Comment: occasionally  . Drug use: Unknown  . Sexual  activity: Not Asked   Other Topics Concern  . None   Social History Narrative  . None   Past Surgical History:  Procedure Laterality Date  . APPENDECTOMY    . BREAST LUMPECTOMY     Right-benign  . BRONCHOSCOPY     Video bronch w/ TBBX 2012  . CESAREAN SECTION     x 4  . TONSILLECTOMY    . VESICOVAGINAL FISTULA CLOSURE W/ TAH     Past Medical History:  Diagnosis Date  . Acute renal insufficiency    due to amphotericin 2012  . Allergic rhinitis, cause unspecified   . Bronchitis, not specified as acute or chronic   . Diverticulosis   . 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 Hypertension   . Irritable bowel   . Other chronic allergic conjunctivitis   . Other chronic sinusitis   . Stroke ([place])    BP (!) 142/87 (BP Location: Left Arm, Patient Position: Sitting, Cuff Size: Normal)   Pulse 69   Resp 14   SpO2 97%   Opioid Risk Score:   Fall Risk Score:  `1  Depression screen PHQ 2/9  Depression screen [facility] 2/9 10/06/2015  Decreased Interest 0  Down, Depressed, Hopeless 1  PHQ - 2 Score 1  Altered sleeping 3  Tired, decreased energy 0  Change in appetite 0  Feeling bad or failure about yourself  1  Trouble concentrating 2  Moving slowly or fidgety/restless 3  Suicidal thoughts 0  PHQ-9 Score 10  Difficult doing work/chores Very difficult    Review of Systems  Constitutional: Negative.   Respiratory: Negative.   Gastrointestinal: Positive for constipation.  Endocrine: Negative.   Genitourinary: Negative.   Musculoskeletal: Positive for gait problem.       Spasms  Allergic/Immunologic: Negative.   Hematological: Negative.   Psychiatric/Behavioral: Positive for dysphoric mood.  All other systems reviewed and are negative.      Objective:   Physical Exam  Constitutional: She appears well-developed, well nourished. NAD. HENT: Normocephalic. Atraumatic.  Cardiovascular: Normal rate and regular rhythm. + systolic Murmur Respiratory: Effort  normal and breath sounds normal. No respiratory distress. no wheezes or rales GI: PEG site with hypergranulation Musc: 1/2 sublux anterior right shoulder Neurological: speaks in low volume with word finding deficits at times.    Alert. still some apraxia. More spontaneous speech. Processing improved. Better with automatic responses.  DTRs 3+ right upper and right lower extremity  Motor:  Right upper extremity and right lower extremity: 0/5 RLE; 1/5 HAD, KE , trace ADF Left upper and left lower extremity 4/5. Flexor tone RUE---1/4 biceps, PT, 2/4 wrist and finger flexors as wellas pecs.  trace extensor tone RLE. 4-5 beats clonus RLE.  Skin: Skin is warm and dry.  Psych: pleasant and in good spirits.       Assessment & Plan:  Medical Problem List and Plan: 1. Right hemiplegia, aphasia, dysphagia secondary to left basal ganglia hypertensive intracranial hemorrhage -continue SNF-based therapies -consider language program at [facility] this coming spring 3. Central neuropathic pain:  gabapentin 100mg  am 200mg  lunch and dinner and 100mg  bedtime             - 4. Spasticity: 300 units botox Right finger and wrist flexors, pecs  -baclofen 10mg  QID  -right knee brace, AFO  -resting WFO and daytime splint  -no change in gabapentin 5. Neuropsych: This patient is not capable of making decisions on her own behalf. -reactive depression--dc celexa             -remeron 6. Hypertension. Cozaar 100 mg daily. Monitor with increased mobility   Follow up as soon as possible. Thirty minutes of face to face patient care time were spent during this visit. All questions were encouraged and answered.

## 2016-01-27 NOTE — Patient Instructions (Signed)
PLEASE CALL ME WITH ANY PROBLEMS OR QUESTIONS ([phone])  

## 2016-01-28 DIAGNOSIS — R2689 Other abnormalities of gait and mobility: Secondary | ICD-10-CM | POA: Diagnosis not present

## 2016-01-28 DIAGNOSIS — M6389 Disorders of muscle in diseases classified elsewhere, multiple sites: Secondary | ICD-10-CM | POA: Diagnosis not present

## 2016-01-28 DIAGNOSIS — I69120 Aphasia following nontraumatic intracerebral hemorrhage: Secondary | ICD-10-CM | POA: Diagnosis not present

## 2016-01-28 DIAGNOSIS — M62561 Muscle wasting and atrophy, not elsewhere classified, right lower leg: Secondary | ICD-10-CM | POA: Diagnosis not present

## 2016-01-28 DIAGNOSIS — G8111 Spastic hemiplegia affecting right dominant side: Secondary | ICD-10-CM | POA: Diagnosis not present

## 2016-01-28 DIAGNOSIS — I69151 Hemiplegia and hemiparesis following nontraumatic intracerebral hemorrhage affecting right dominant side: Secondary | ICD-10-CM | POA: Diagnosis not present

## 2016-01-29 DIAGNOSIS — M6389 Disorders of muscle in diseases classified elsewhere, multiple sites: Secondary | ICD-10-CM | POA: Diagnosis not present

## 2016-01-29 DIAGNOSIS — G8111 Spastic hemiplegia affecting right dominant side: Secondary | ICD-10-CM | POA: Diagnosis not present

## 2016-01-29 DIAGNOSIS — M62561 Muscle wasting and atrophy, not elsewhere classified, right lower leg: Secondary | ICD-10-CM | POA: Diagnosis not present

## 2016-01-29 DIAGNOSIS — I69151 Hemiplegia and hemiparesis following nontraumatic intracerebral hemorrhage affecting right dominant side: Secondary | ICD-10-CM | POA: Diagnosis not present

## 2016-01-29 DIAGNOSIS — R2689 Other abnormalities of gait and mobility: Secondary | ICD-10-CM | POA: Diagnosis not present

## 2016-01-29 DIAGNOSIS — I69120 Aphasia following nontraumatic intracerebral hemorrhage: Secondary | ICD-10-CM | POA: Diagnosis not present

## 2016-01-30 DIAGNOSIS — G8111 Spastic hemiplegia affecting right dominant side: Secondary | ICD-10-CM | POA: Diagnosis not present

## 2016-01-30 DIAGNOSIS — M62561 Muscle wasting and atrophy, not elsewhere classified, right lower leg: Secondary | ICD-10-CM | POA: Diagnosis not present

## 2016-01-30 DIAGNOSIS — I69120 Aphasia following nontraumatic intracerebral hemorrhage: Secondary | ICD-10-CM | POA: Diagnosis not present

## 2016-01-30 DIAGNOSIS — I69151 Hemiplegia and hemiparesis following nontraumatic intracerebral hemorrhage affecting right dominant side: Secondary | ICD-10-CM | POA: Diagnosis not present

## 2016-01-30 DIAGNOSIS — M6389 Disorders of muscle in diseases classified elsewhere, multiple sites: Secondary | ICD-10-CM | POA: Diagnosis not present

## 2016-01-30 DIAGNOSIS — R2689 Other abnormalities of gait and mobility: Secondary | ICD-10-CM | POA: Diagnosis not present

## 2016-02-02 ENCOUNTER — Encounter: Payer: Self-pay | Admitting: Physical Medicine & Rehabilitation

## 2016-02-02 ENCOUNTER — Encounter (HOSPITAL_BASED_OUTPATIENT_CLINIC_OR_DEPARTMENT_OTHER): Payer: Medicare Other | Admitting: Physical Medicine & Rehabilitation

## 2016-02-02 VITALS — BP 160/97 | HR 70

## 2016-02-02 DIAGNOSIS — M62561 Muscle wasting and atrophy, not elsewhere classified, right lower leg: Secondary | ICD-10-CM | POA: Diagnosis not present

## 2016-02-02 DIAGNOSIS — I69251 Hemiplegia and hemiparesis following other nontraumatic intracranial hemorrhage affecting right dominant side: Secondary | ICD-10-CM | POA: Diagnosis not present

## 2016-02-02 DIAGNOSIS — Z5189 Encounter for other specified aftercare: Secondary | ICD-10-CM | POA: Diagnosis not present

## 2016-02-02 DIAGNOSIS — I69191 Dysphagia following nontraumatic intracerebral hemorrhage: Secondary | ICD-10-CM | POA: Diagnosis not present

## 2016-02-02 DIAGNOSIS — I69151 Hemiplegia and hemiparesis following nontraumatic intracerebral hemorrhage affecting right dominant side: Secondary | ICD-10-CM | POA: Diagnosis not present

## 2016-02-02 DIAGNOSIS — G8110 Spastic hemiplegia affecting unspecified side: Secondary | ICD-10-CM

## 2016-02-02 DIAGNOSIS — F329 Major depressive disorder, single episode, unspecified: Secondary | ICD-10-CM | POA: Diagnosis not present

## 2016-02-02 DIAGNOSIS — G8111 Spastic hemiplegia affecting right dominant side: Secondary | ICD-10-CM

## 2016-02-02 DIAGNOSIS — M6389 Disorders of muscle in diseases classified elsewhere, multiple sites: Secondary | ICD-10-CM | POA: Diagnosis not present

## 2016-02-02 DIAGNOSIS — E785 Hyperlipidemia, unspecified: Secondary | ICD-10-CM | POA: Diagnosis not present

## 2016-02-02 DIAGNOSIS — I69120 Aphasia following nontraumatic intracerebral hemorrhage: Secondary | ICD-10-CM | POA: Diagnosis not present

## 2016-02-02 DIAGNOSIS — I10 Essential (primary) hypertension: Secondary | ICD-10-CM | POA: Diagnosis not present

## 2016-02-02 DIAGNOSIS — R2689 Other abnormalities of gait and mobility: Secondary | ICD-10-CM | POA: Diagnosis not present

## 2016-02-02 NOTE — Patient Instructions (Signed)
PLEASE CALL ME WITH ANY PROBLEMS OR QUESTIONS ([phone])  

## 2016-02-02 NOTE — Progress Notes (Signed)
Botox Injection for spasticity using needle EMG guidance Indication: Spastic hemiplegia, dominant side (HCC)   Dilution: 100 Units/ml        Total Units Injected: 300 Indication: Severe spasticity which interferes with ADL,mobility and/or  hygiene and is unresponsive to medication management and other conservative care Informed consent was obtained after describing risks and benefits of the procedure with the patient. This includes bleeding, bruising, infection, excessive weakness, or medication side effects. A REMS form is on file and signed.  Needle: 32mm injectable monopolar needle electrode  Number of units per muscle Pectoralis Major 50 units Pectoralis Minor 50 units Biceps 0 units Brachioradialis 0 units FCR 25 units FCU 25 units FDS 50 units FDP 50 units FPL 0 units Pronator Teres 50 units Pronator Quadratus 0 units  All injections were done after obtaining appropriate EMG activity and after negative drawback for blood. The patient tolerated the procedure well. Post procedure instructions were given. Return in about 2 months (around 04/03/2016).

## 2016-02-03 DIAGNOSIS — I69151 Hemiplegia and hemiparesis following nontraumatic intracerebral hemorrhage affecting right dominant side: Secondary | ICD-10-CM | POA: Diagnosis not present

## 2016-02-03 DIAGNOSIS — I69120 Aphasia following nontraumatic intracerebral hemorrhage: Secondary | ICD-10-CM | POA: Diagnosis not present

## 2016-02-03 DIAGNOSIS — G8111 Spastic hemiplegia affecting right dominant side: Secondary | ICD-10-CM | POA: Diagnosis not present

## 2016-02-03 DIAGNOSIS — M62561 Muscle wasting and atrophy, not elsewhere classified, right lower leg: Secondary | ICD-10-CM | POA: Diagnosis not present

## 2016-02-03 DIAGNOSIS — R2689 Other abnormalities of gait and mobility: Secondary | ICD-10-CM | POA: Diagnosis not present

## 2016-02-03 DIAGNOSIS — M6389 Disorders of muscle in diseases classified elsewhere, multiple sites: Secondary | ICD-10-CM | POA: Diagnosis not present

## 2016-02-04 DIAGNOSIS — M6389 Disorders of muscle in diseases classified elsewhere, multiple sites: Secondary | ICD-10-CM | POA: Diagnosis not present

## 2016-02-04 DIAGNOSIS — I69151 Hemiplegia and hemiparesis following nontraumatic intracerebral hemorrhage affecting right dominant side: Secondary | ICD-10-CM | POA: Diagnosis not present

## 2016-02-04 DIAGNOSIS — I69120 Aphasia following nontraumatic intracerebral hemorrhage: Secondary | ICD-10-CM | POA: Diagnosis not present

## 2016-02-04 DIAGNOSIS — G8111 Spastic hemiplegia affecting right dominant side: Secondary | ICD-10-CM | POA: Diagnosis not present

## 2016-02-04 DIAGNOSIS — M62561 Muscle wasting and atrophy, not elsewhere classified, right lower leg: Secondary | ICD-10-CM | POA: Diagnosis not present

## 2016-02-04 DIAGNOSIS — R2689 Other abnormalities of gait and mobility: Secondary | ICD-10-CM | POA: Diagnosis not present

## 2016-02-05 DIAGNOSIS — R2689 Other abnormalities of gait and mobility: Secondary | ICD-10-CM | POA: Diagnosis not present

## 2016-02-05 DIAGNOSIS — I69120 Aphasia following nontraumatic intracerebral hemorrhage: Secondary | ICD-10-CM | POA: Diagnosis not present

## 2016-02-05 DIAGNOSIS — I69151 Hemiplegia and hemiparesis following nontraumatic intracerebral hemorrhage affecting right dominant side: Secondary | ICD-10-CM | POA: Diagnosis not present

## 2016-02-05 DIAGNOSIS — G8111 Spastic hemiplegia affecting right dominant side: Secondary | ICD-10-CM | POA: Diagnosis not present

## 2016-02-05 DIAGNOSIS — M6389 Disorders of muscle in diseases classified elsewhere, multiple sites: Secondary | ICD-10-CM | POA: Diagnosis not present

## 2016-02-05 DIAGNOSIS — M62561 Muscle wasting and atrophy, not elsewhere classified, right lower leg: Secondary | ICD-10-CM | POA: Diagnosis not present

## 2016-02-06 DIAGNOSIS — R2689 Other abnormalities of gait and mobility: Secondary | ICD-10-CM | POA: Diagnosis not present

## 2016-02-06 DIAGNOSIS — M62561 Muscle wasting and atrophy, not elsewhere classified, right lower leg: Secondary | ICD-10-CM | POA: Diagnosis not present

## 2016-02-06 DIAGNOSIS — G8111 Spastic hemiplegia affecting right dominant side: Secondary | ICD-10-CM | POA: Diagnosis not present

## 2016-02-06 DIAGNOSIS — M6389 Disorders of muscle in diseases classified elsewhere, multiple sites: Secondary | ICD-10-CM | POA: Diagnosis not present

## 2016-02-06 DIAGNOSIS — I69120 Aphasia following nontraumatic intracerebral hemorrhage: Secondary | ICD-10-CM | POA: Diagnosis not present

## 2016-02-06 DIAGNOSIS — I69151 Hemiplegia and hemiparesis following nontraumatic intracerebral hemorrhage affecting right dominant side: Secondary | ICD-10-CM | POA: Diagnosis not present

## 2016-02-09 DIAGNOSIS — I69151 Hemiplegia and hemiparesis following nontraumatic intracerebral hemorrhage affecting right dominant side: Secondary | ICD-10-CM | POA: Diagnosis not present

## 2016-02-09 DIAGNOSIS — G8111 Spastic hemiplegia affecting right dominant side: Secondary | ICD-10-CM | POA: Diagnosis not present

## 2016-02-09 DIAGNOSIS — M62561 Muscle wasting and atrophy, not elsewhere classified, right lower leg: Secondary | ICD-10-CM | POA: Diagnosis not present

## 2016-02-09 DIAGNOSIS — I69120 Aphasia following nontraumatic intracerebral hemorrhage: Secondary | ICD-10-CM | POA: Diagnosis not present

## 2016-02-09 DIAGNOSIS — R2689 Other abnormalities of gait and mobility: Secondary | ICD-10-CM | POA: Diagnosis not present

## 2016-02-09 DIAGNOSIS — M6389 Disorders of muscle in diseases classified elsewhere, multiple sites: Secondary | ICD-10-CM | POA: Diagnosis not present

## 2016-02-10 DIAGNOSIS — I69120 Aphasia following nontraumatic intracerebral hemorrhage: Secondary | ICD-10-CM | POA: Diagnosis not present

## 2016-02-10 DIAGNOSIS — M62561 Muscle wasting and atrophy, not elsewhere classified, right lower leg: Secondary | ICD-10-CM | POA: Diagnosis not present

## 2016-02-10 DIAGNOSIS — R2689 Other abnormalities of gait and mobility: Secondary | ICD-10-CM | POA: Diagnosis not present

## 2016-02-10 DIAGNOSIS — G8111 Spastic hemiplegia affecting right dominant side: Secondary | ICD-10-CM | POA: Diagnosis not present

## 2016-02-10 DIAGNOSIS — M6389 Disorders of muscle in diseases classified elsewhere, multiple sites: Secondary | ICD-10-CM | POA: Diagnosis not present

## 2016-02-10 DIAGNOSIS — I69151 Hemiplegia and hemiparesis following nontraumatic intracerebral hemorrhage affecting right dominant side: Secondary | ICD-10-CM | POA: Diagnosis not present

## 2016-02-11 DIAGNOSIS — I69120 Aphasia following nontraumatic intracerebral hemorrhage: Secondary | ICD-10-CM | POA: Diagnosis not present

## 2016-02-11 DIAGNOSIS — R2689 Other abnormalities of gait and mobility: Secondary | ICD-10-CM | POA: Diagnosis not present

## 2016-02-11 DIAGNOSIS — I69151 Hemiplegia and hemiparesis following nontraumatic intracerebral hemorrhage affecting right dominant side: Secondary | ICD-10-CM | POA: Diagnosis not present

## 2016-02-11 DIAGNOSIS — M62561 Muscle wasting and atrophy, not elsewhere classified, right lower leg: Secondary | ICD-10-CM | POA: Diagnosis not present

## 2016-02-11 DIAGNOSIS — G8111 Spastic hemiplegia affecting right dominant side: Secondary | ICD-10-CM | POA: Diagnosis not present

## 2016-02-11 DIAGNOSIS — M6389 Disorders of muscle in diseases classified elsewhere, multiple sites: Secondary | ICD-10-CM | POA: Diagnosis not present

## 2016-02-12 DIAGNOSIS — G8111 Spastic hemiplegia affecting right dominant side: Secondary | ICD-10-CM | POA: Diagnosis not present

## 2016-02-12 DIAGNOSIS — R2689 Other abnormalities of gait and mobility: Secondary | ICD-10-CM | POA: Diagnosis not present

## 2016-02-12 DIAGNOSIS — M6389 Disorders of muscle in diseases classified elsewhere, multiple sites: Secondary | ICD-10-CM | POA: Diagnosis not present

## 2016-02-12 DIAGNOSIS — I69151 Hemiplegia and hemiparesis following nontraumatic intracerebral hemorrhage affecting right dominant side: Secondary | ICD-10-CM | POA: Diagnosis not present

## 2016-02-12 DIAGNOSIS — I69120 Aphasia following nontraumatic intracerebral hemorrhage: Secondary | ICD-10-CM | POA: Diagnosis not present

## 2016-02-12 DIAGNOSIS — M62561 Muscle wasting and atrophy, not elsewhere classified, right lower leg: Secondary | ICD-10-CM | POA: Diagnosis not present

## 2016-02-13 DIAGNOSIS — R2689 Other abnormalities of gait and mobility: Secondary | ICD-10-CM | POA: Diagnosis not present

## 2016-02-13 DIAGNOSIS — H25813 Combined forms of age-related cataract, bilateral: Secondary | ICD-10-CM | POA: Diagnosis not present

## 2016-02-13 DIAGNOSIS — H04123 Dry eye syndrome of bilateral lacrimal glands: Secondary | ICD-10-CM | POA: Diagnosis not present

## 2016-02-13 DIAGNOSIS — H401211 Low-tension glaucoma, right eye, mild stage: Secondary | ICD-10-CM | POA: Diagnosis not present

## 2016-02-13 DIAGNOSIS — M62561 Muscle wasting and atrophy, not elsewhere classified, right lower leg: Secondary | ICD-10-CM | POA: Diagnosis not present

## 2016-02-13 DIAGNOSIS — M6389 Disorders of muscle in diseases classified elsewhere, multiple sites: Secondary | ICD-10-CM | POA: Diagnosis not present

## 2016-02-13 DIAGNOSIS — H401212 Low-tension glaucoma, right eye, moderate stage: Secondary | ICD-10-CM | POA: Diagnosis not present

## 2016-02-13 DIAGNOSIS — G8111 Spastic hemiplegia affecting right dominant side: Secondary | ICD-10-CM | POA: Diagnosis not present

## 2016-02-13 DIAGNOSIS — I69151 Hemiplegia and hemiparesis following nontraumatic intracerebral hemorrhage affecting right dominant side: Secondary | ICD-10-CM | POA: Diagnosis not present

## 2016-02-13 DIAGNOSIS — I69120 Aphasia following nontraumatic intracerebral hemorrhage: Secondary | ICD-10-CM | POA: Diagnosis not present

## 2016-02-16 DIAGNOSIS — R2689 Other abnormalities of gait and mobility: Secondary | ICD-10-CM | POA: Diagnosis not present

## 2016-02-16 DIAGNOSIS — M62561 Muscle wasting and atrophy, not elsewhere classified, right lower leg: Secondary | ICD-10-CM | POA: Diagnosis not present

## 2016-02-16 DIAGNOSIS — G8111 Spastic hemiplegia affecting right dominant side: Secondary | ICD-10-CM | POA: Diagnosis not present

## 2016-02-16 DIAGNOSIS — M6389 Disorders of muscle in diseases classified elsewhere, multiple sites: Secondary | ICD-10-CM | POA: Diagnosis not present

## 2016-02-16 DIAGNOSIS — I69151 Hemiplegia and hemiparesis following nontraumatic intracerebral hemorrhage affecting right dominant side: Secondary | ICD-10-CM | POA: Diagnosis not present

## 2016-02-16 DIAGNOSIS — I69120 Aphasia following nontraumatic intracerebral hemorrhage: Secondary | ICD-10-CM | POA: Diagnosis not present

## 2016-02-17 DIAGNOSIS — I69151 Hemiplegia and hemiparesis following nontraumatic intracerebral hemorrhage affecting right dominant side: Secondary | ICD-10-CM | POA: Diagnosis not present

## 2016-02-17 DIAGNOSIS — M62561 Muscle wasting and atrophy, not elsewhere classified, right lower leg: Secondary | ICD-10-CM | POA: Diagnosis not present

## 2016-02-17 DIAGNOSIS — M6389 Disorders of muscle in diseases classified elsewhere, multiple sites: Secondary | ICD-10-CM | POA: Diagnosis not present

## 2016-02-17 DIAGNOSIS — R2689 Other abnormalities of gait and mobility: Secondary | ICD-10-CM | POA: Diagnosis not present

## 2016-02-17 DIAGNOSIS — I69120 Aphasia following nontraumatic intracerebral hemorrhage: Secondary | ICD-10-CM | POA: Diagnosis not present

## 2016-02-17 DIAGNOSIS — G8111 Spastic hemiplegia affecting right dominant side: Secondary | ICD-10-CM | POA: Diagnosis not present

## 2016-02-18 DIAGNOSIS — I613 Nontraumatic intracerebral hemorrhage in brain stem: Secondary | ICD-10-CM | POA: Diagnosis not present

## 2016-02-18 DIAGNOSIS — G8111 Spastic hemiplegia affecting right dominant side: Secondary | ICD-10-CM | POA: Diagnosis not present

## 2016-02-18 DIAGNOSIS — M6389 Disorders of muscle in diseases classified elsewhere, multiple sites: Secondary | ICD-10-CM | POA: Diagnosis not present

## 2016-02-18 DIAGNOSIS — R278 Other lack of coordination: Secondary | ICD-10-CM | POA: Diagnosis not present

## 2016-02-18 DIAGNOSIS — I69120 Aphasia following nontraumatic intracerebral hemorrhage: Secondary | ICD-10-CM | POA: Diagnosis not present

## 2016-02-18 DIAGNOSIS — I69151 Hemiplegia and hemiparesis following nontraumatic intracerebral hemorrhage affecting right dominant side: Secondary | ICD-10-CM | POA: Diagnosis not present

## 2016-02-18 DIAGNOSIS — R601 Generalized edema: Secondary | ICD-10-CM | POA: Diagnosis not present

## 2016-02-19 DIAGNOSIS — R601 Generalized edema: Secondary | ICD-10-CM | POA: Diagnosis not present

## 2016-02-19 DIAGNOSIS — G8111 Spastic hemiplegia affecting right dominant side: Secondary | ICD-10-CM | POA: Diagnosis not present

## 2016-02-19 DIAGNOSIS — R278 Other lack of coordination: Secondary | ICD-10-CM | POA: Diagnosis not present

## 2016-02-19 DIAGNOSIS — I69120 Aphasia following nontraumatic intracerebral hemorrhage: Secondary | ICD-10-CM | POA: Diagnosis not present

## 2016-02-19 DIAGNOSIS — M6389 Disorders of muscle in diseases classified elsewhere, multiple sites: Secondary | ICD-10-CM | POA: Diagnosis not present

## 2016-02-19 DIAGNOSIS — I69151 Hemiplegia and hemiparesis following nontraumatic intracerebral hemorrhage affecting right dominant side: Secondary | ICD-10-CM | POA: Diagnosis not present

## 2016-02-20 DIAGNOSIS — R601 Generalized edema: Secondary | ICD-10-CM | POA: Diagnosis not present

## 2016-02-20 DIAGNOSIS — I69120 Aphasia following nontraumatic intracerebral hemorrhage: Secondary | ICD-10-CM | POA: Diagnosis not present

## 2016-02-20 DIAGNOSIS — R278 Other lack of coordination: Secondary | ICD-10-CM | POA: Diagnosis not present

## 2016-02-20 DIAGNOSIS — M6389 Disorders of muscle in diseases classified elsewhere, multiple sites: Secondary | ICD-10-CM | POA: Diagnosis not present

## 2016-02-20 DIAGNOSIS — I69151 Hemiplegia and hemiparesis following nontraumatic intracerebral hemorrhage affecting right dominant side: Secondary | ICD-10-CM | POA: Diagnosis not present

## 2016-02-20 DIAGNOSIS — G8111 Spastic hemiplegia affecting right dominant side: Secondary | ICD-10-CM | POA: Diagnosis not present

## 2016-02-23 DIAGNOSIS — I69151 Hemiplegia and hemiparesis following nontraumatic intracerebral hemorrhage affecting right dominant side: Secondary | ICD-10-CM | POA: Diagnosis not present

## 2016-02-23 DIAGNOSIS — I69120 Aphasia following nontraumatic intracerebral hemorrhage: Secondary | ICD-10-CM | POA: Diagnosis not present

## 2016-02-23 DIAGNOSIS — R278 Other lack of coordination: Secondary | ICD-10-CM | POA: Diagnosis not present

## 2016-02-23 DIAGNOSIS — M6389 Disorders of muscle in diseases classified elsewhere, multiple sites: Secondary | ICD-10-CM | POA: Diagnosis not present

## 2016-02-23 DIAGNOSIS — G8111 Spastic hemiplegia affecting right dominant side: Secondary | ICD-10-CM | POA: Diagnosis not present

## 2016-02-23 DIAGNOSIS — R601 Generalized edema: Secondary | ICD-10-CM | POA: Diagnosis not present

## 2016-02-24 DIAGNOSIS — M6389 Disorders of muscle in diseases classified elsewhere, multiple sites: Secondary | ICD-10-CM | POA: Diagnosis not present

## 2016-02-24 DIAGNOSIS — R278 Other lack of coordination: Secondary | ICD-10-CM | POA: Diagnosis not present

## 2016-02-24 DIAGNOSIS — G8111 Spastic hemiplegia affecting right dominant side: Secondary | ICD-10-CM | POA: Diagnosis not present

## 2016-02-24 DIAGNOSIS — I69120 Aphasia following nontraumatic intracerebral hemorrhage: Secondary | ICD-10-CM | POA: Diagnosis not present

## 2016-02-24 DIAGNOSIS — I69151 Hemiplegia and hemiparesis following nontraumatic intracerebral hemorrhage affecting right dominant side: Secondary | ICD-10-CM | POA: Diagnosis not present

## 2016-02-24 DIAGNOSIS — R601 Generalized edema: Secondary | ICD-10-CM | POA: Diagnosis not present

## 2016-02-25 DIAGNOSIS — R278 Other lack of coordination: Secondary | ICD-10-CM | POA: Diagnosis not present

## 2016-02-25 DIAGNOSIS — M6389 Disorders of muscle in diseases classified elsewhere, multiple sites: Secondary | ICD-10-CM | POA: Diagnosis not present

## 2016-02-25 DIAGNOSIS — I69151 Hemiplegia and hemiparesis following nontraumatic intracerebral hemorrhage affecting right dominant side: Secondary | ICD-10-CM | POA: Diagnosis not present

## 2016-02-25 DIAGNOSIS — G8111 Spastic hemiplegia affecting right dominant side: Secondary | ICD-10-CM | POA: Diagnosis not present

## 2016-02-25 DIAGNOSIS — I69120 Aphasia following nontraumatic intracerebral hemorrhage: Secondary | ICD-10-CM | POA: Diagnosis not present

## 2016-02-25 DIAGNOSIS — R601 Generalized edema: Secondary | ICD-10-CM | POA: Diagnosis not present

## 2016-02-26 DIAGNOSIS — M6389 Disorders of muscle in diseases classified elsewhere, multiple sites: Secondary | ICD-10-CM | POA: Diagnosis not present

## 2016-02-26 DIAGNOSIS — I69120 Aphasia following nontraumatic intracerebral hemorrhage: Secondary | ICD-10-CM | POA: Diagnosis not present

## 2016-02-26 DIAGNOSIS — I69151 Hemiplegia and hemiparesis following nontraumatic intracerebral hemorrhage affecting right dominant side: Secondary | ICD-10-CM | POA: Diagnosis not present

## 2016-02-26 DIAGNOSIS — R278 Other lack of coordination: Secondary | ICD-10-CM | POA: Diagnosis not present

## 2016-02-26 DIAGNOSIS — R601 Generalized edema: Secondary | ICD-10-CM | POA: Diagnosis not present

## 2016-02-26 DIAGNOSIS — G8111 Spastic hemiplegia affecting right dominant side: Secondary | ICD-10-CM | POA: Diagnosis not present

## 2016-02-27 DIAGNOSIS — G8111 Spastic hemiplegia affecting right dominant side: Secondary | ICD-10-CM | POA: Diagnosis not present

## 2016-02-27 DIAGNOSIS — I69151 Hemiplegia and hemiparesis following nontraumatic intracerebral hemorrhage affecting right dominant side: Secondary | ICD-10-CM | POA: Diagnosis not present

## 2016-02-27 DIAGNOSIS — M6389 Disorders of muscle in diseases classified elsewhere, multiple sites: Secondary | ICD-10-CM | POA: Diagnosis not present

## 2016-02-27 DIAGNOSIS — R601 Generalized edema: Secondary | ICD-10-CM | POA: Diagnosis not present

## 2016-02-27 DIAGNOSIS — R278 Other lack of coordination: Secondary | ICD-10-CM | POA: Diagnosis not present

## 2016-02-27 DIAGNOSIS — I69120 Aphasia following nontraumatic intracerebral hemorrhage: Secondary | ICD-10-CM | POA: Diagnosis not present

## 2016-03-01 DIAGNOSIS — I69120 Aphasia following nontraumatic intracerebral hemorrhage: Secondary | ICD-10-CM | POA: Diagnosis not present

## 2016-03-01 DIAGNOSIS — G8111 Spastic hemiplegia affecting right dominant side: Secondary | ICD-10-CM | POA: Diagnosis not present

## 2016-03-01 DIAGNOSIS — I69151 Hemiplegia and hemiparesis following nontraumatic intracerebral hemorrhage affecting right dominant side: Secondary | ICD-10-CM | POA: Diagnosis not present

## 2016-03-01 DIAGNOSIS — R278 Other lack of coordination: Secondary | ICD-10-CM | POA: Diagnosis not present

## 2016-03-01 DIAGNOSIS — R601 Generalized edema: Secondary | ICD-10-CM | POA: Diagnosis not present

## 2016-03-01 DIAGNOSIS — M6389 Disorders of muscle in diseases classified elsewhere, multiple sites: Secondary | ICD-10-CM | POA: Diagnosis not present

## 2016-03-02 DIAGNOSIS — I69151 Hemiplegia and hemiparesis following nontraumatic intracerebral hemorrhage affecting right dominant side: Secondary | ICD-10-CM | POA: Diagnosis not present

## 2016-03-02 DIAGNOSIS — G8111 Spastic hemiplegia affecting right dominant side: Secondary | ICD-10-CM | POA: Diagnosis not present

## 2016-03-02 DIAGNOSIS — M6389 Disorders of muscle in diseases classified elsewhere, multiple sites: Secondary | ICD-10-CM | POA: Diagnosis not present

## 2016-03-02 DIAGNOSIS — I69120 Aphasia following nontraumatic intracerebral hemorrhage: Secondary | ICD-10-CM | POA: Diagnosis not present

## 2016-03-02 DIAGNOSIS — R601 Generalized edema: Secondary | ICD-10-CM | POA: Diagnosis not present

## 2016-03-02 DIAGNOSIS — R278 Other lack of coordination: Secondary | ICD-10-CM | POA: Diagnosis not present

## 2016-03-03 DIAGNOSIS — R601 Generalized edema: Secondary | ICD-10-CM | POA: Diagnosis not present

## 2016-03-03 DIAGNOSIS — I69151 Hemiplegia and hemiparesis following nontraumatic intracerebral hemorrhage affecting right dominant side: Secondary | ICD-10-CM | POA: Diagnosis not present

## 2016-03-03 DIAGNOSIS — M6389 Disorders of muscle in diseases classified elsewhere, multiple sites: Secondary | ICD-10-CM | POA: Diagnosis not present

## 2016-03-03 DIAGNOSIS — I69120 Aphasia following nontraumatic intracerebral hemorrhage: Secondary | ICD-10-CM | POA: Diagnosis not present

## 2016-03-03 DIAGNOSIS — G8111 Spastic hemiplegia affecting right dominant side: Secondary | ICD-10-CM | POA: Diagnosis not present

## 2016-03-03 DIAGNOSIS — R278 Other lack of coordination: Secondary | ICD-10-CM | POA: Diagnosis not present

## 2016-03-04 DIAGNOSIS — G8111 Spastic hemiplegia affecting right dominant side: Secondary | ICD-10-CM | POA: Diagnosis not present

## 2016-03-04 DIAGNOSIS — M6389 Disorders of muscle in diseases classified elsewhere, multiple sites: Secondary | ICD-10-CM | POA: Diagnosis not present

## 2016-03-04 DIAGNOSIS — R601 Generalized edema: Secondary | ICD-10-CM | POA: Diagnosis not present

## 2016-03-04 DIAGNOSIS — R278 Other lack of coordination: Secondary | ICD-10-CM | POA: Diagnosis not present

## 2016-03-04 DIAGNOSIS — I69120 Aphasia following nontraumatic intracerebral hemorrhage: Secondary | ICD-10-CM | POA: Diagnosis not present

## 2016-03-04 DIAGNOSIS — I69151 Hemiplegia and hemiparesis following nontraumatic intracerebral hemorrhage affecting right dominant side: Secondary | ICD-10-CM | POA: Diagnosis not present

## 2016-03-05 DIAGNOSIS — R601 Generalized edema: Secondary | ICD-10-CM | POA: Diagnosis not present

## 2016-03-05 DIAGNOSIS — I69151 Hemiplegia and hemiparesis following nontraumatic intracerebral hemorrhage affecting right dominant side: Secondary | ICD-10-CM | POA: Diagnosis not present

## 2016-03-05 DIAGNOSIS — I69120 Aphasia following nontraumatic intracerebral hemorrhage: Secondary | ICD-10-CM | POA: Diagnosis not present

## 2016-03-05 DIAGNOSIS — G8111 Spastic hemiplegia affecting right dominant side: Secondary | ICD-10-CM | POA: Diagnosis not present

## 2016-03-05 DIAGNOSIS — M6389 Disorders of muscle in diseases classified elsewhere, multiple sites: Secondary | ICD-10-CM | POA: Diagnosis not present

## 2016-03-05 DIAGNOSIS — R278 Other lack of coordination: Secondary | ICD-10-CM | POA: Diagnosis not present

## 2016-03-08 DIAGNOSIS — R601 Generalized edema: Secondary | ICD-10-CM | POA: Diagnosis not present

## 2016-03-08 DIAGNOSIS — I69151 Hemiplegia and hemiparesis following nontraumatic intracerebral hemorrhage affecting right dominant side: Secondary | ICD-10-CM | POA: Diagnosis not present

## 2016-03-08 DIAGNOSIS — R278 Other lack of coordination: Secondary | ICD-10-CM | POA: Diagnosis not present

## 2016-03-08 DIAGNOSIS — I69120 Aphasia following nontraumatic intracerebral hemorrhage: Secondary | ICD-10-CM | POA: Diagnosis not present

## 2016-03-08 DIAGNOSIS — M6389 Disorders of muscle in diseases classified elsewhere, multiple sites: Secondary | ICD-10-CM | POA: Diagnosis not present

## 2016-03-08 DIAGNOSIS — G8111 Spastic hemiplegia affecting right dominant side: Secondary | ICD-10-CM | POA: Diagnosis not present

## 2016-03-09 DIAGNOSIS — R601 Generalized edema: Secondary | ICD-10-CM | POA: Diagnosis not present

## 2016-03-09 DIAGNOSIS — M6389 Disorders of muscle in diseases classified elsewhere, multiple sites: Secondary | ICD-10-CM | POA: Diagnosis not present

## 2016-03-09 DIAGNOSIS — G8111 Spastic hemiplegia affecting right dominant side: Secondary | ICD-10-CM | POA: Diagnosis not present

## 2016-03-09 DIAGNOSIS — I69120 Aphasia following nontraumatic intracerebral hemorrhage: Secondary | ICD-10-CM | POA: Diagnosis not present

## 2016-03-09 DIAGNOSIS — R278 Other lack of coordination: Secondary | ICD-10-CM | POA: Diagnosis not present

## 2016-03-09 DIAGNOSIS — I69151 Hemiplegia and hemiparesis following nontraumatic intracerebral hemorrhage affecting right dominant side: Secondary | ICD-10-CM | POA: Diagnosis not present

## 2016-03-10 DIAGNOSIS — I69120 Aphasia following nontraumatic intracerebral hemorrhage: Secondary | ICD-10-CM | POA: Diagnosis not present

## 2016-03-10 DIAGNOSIS — M6389 Disorders of muscle in diseases classified elsewhere, multiple sites: Secondary | ICD-10-CM | POA: Diagnosis not present

## 2016-03-10 DIAGNOSIS — I69151 Hemiplegia and hemiparesis following nontraumatic intracerebral hemorrhage affecting right dominant side: Secondary | ICD-10-CM | POA: Diagnosis not present

## 2016-03-10 DIAGNOSIS — G8111 Spastic hemiplegia affecting right dominant side: Secondary | ICD-10-CM | POA: Diagnosis not present

## 2016-03-10 DIAGNOSIS — R601 Generalized edema: Secondary | ICD-10-CM | POA: Diagnosis not present

## 2016-03-10 DIAGNOSIS — R278 Other lack of coordination: Secondary | ICD-10-CM | POA: Diagnosis not present

## 2016-03-15 DIAGNOSIS — G8111 Spastic hemiplegia affecting right dominant side: Secondary | ICD-10-CM | POA: Diagnosis not present

## 2016-03-15 DIAGNOSIS — R601 Generalized edema: Secondary | ICD-10-CM | POA: Diagnosis not present

## 2016-03-15 DIAGNOSIS — M6389 Disorders of muscle in diseases classified elsewhere, multiple sites: Secondary | ICD-10-CM | POA: Diagnosis not present

## 2016-03-15 DIAGNOSIS — I69151 Hemiplegia and hemiparesis following nontraumatic intracerebral hemorrhage affecting right dominant side: Secondary | ICD-10-CM | POA: Diagnosis not present

## 2016-03-15 DIAGNOSIS — R278 Other lack of coordination: Secondary | ICD-10-CM | POA: Diagnosis not present

## 2016-03-15 DIAGNOSIS — I69120 Aphasia following nontraumatic intracerebral hemorrhage: Secondary | ICD-10-CM | POA: Diagnosis not present

## 2016-03-16 DIAGNOSIS — M6389 Disorders of muscle in diseases classified elsewhere, multiple sites: Secondary | ICD-10-CM | POA: Diagnosis not present

## 2016-03-16 DIAGNOSIS — R278 Other lack of coordination: Secondary | ICD-10-CM | POA: Diagnosis not present

## 2016-03-16 DIAGNOSIS — I69151 Hemiplegia and hemiparesis following nontraumatic intracerebral hemorrhage affecting right dominant side: Secondary | ICD-10-CM | POA: Diagnosis not present

## 2016-03-16 DIAGNOSIS — I69120 Aphasia following nontraumatic intracerebral hemorrhage: Secondary | ICD-10-CM | POA: Diagnosis not present

## 2016-03-16 DIAGNOSIS — G8111 Spastic hemiplegia affecting right dominant side: Secondary | ICD-10-CM | POA: Diagnosis not present

## 2016-03-16 DIAGNOSIS — R601 Generalized edema: Secondary | ICD-10-CM | POA: Diagnosis not present

## 2016-03-17 DIAGNOSIS — R278 Other lack of coordination: Secondary | ICD-10-CM | POA: Diagnosis not present

## 2016-03-17 DIAGNOSIS — M6389 Disorders of muscle in diseases classified elsewhere, multiple sites: Secondary | ICD-10-CM | POA: Diagnosis not present

## 2016-03-17 DIAGNOSIS — I69151 Hemiplegia and hemiparesis following nontraumatic intracerebral hemorrhage affecting right dominant side: Secondary | ICD-10-CM | POA: Diagnosis not present

## 2016-03-17 DIAGNOSIS — G8111 Spastic hemiplegia affecting right dominant side: Secondary | ICD-10-CM | POA: Diagnosis not present

## 2016-03-17 DIAGNOSIS — R601 Generalized edema: Secondary | ICD-10-CM | POA: Diagnosis not present

## 2016-03-17 DIAGNOSIS — I69120 Aphasia following nontraumatic intracerebral hemorrhage: Secondary | ICD-10-CM | POA: Diagnosis not present

## 2016-03-18 DIAGNOSIS — I69120 Aphasia following nontraumatic intracerebral hemorrhage: Secondary | ICD-10-CM | POA: Diagnosis not present

## 2016-03-18 DIAGNOSIS — R601 Generalized edema: Secondary | ICD-10-CM | POA: Diagnosis not present

## 2016-03-18 DIAGNOSIS — M6389 Disorders of muscle in diseases classified elsewhere, multiple sites: Secondary | ICD-10-CM | POA: Diagnosis not present

## 2016-03-18 DIAGNOSIS — R278 Other lack of coordination: Secondary | ICD-10-CM | POA: Diagnosis not present

## 2016-03-18 DIAGNOSIS — G8111 Spastic hemiplegia affecting right dominant side: Secondary | ICD-10-CM | POA: Diagnosis not present

## 2016-03-18 DIAGNOSIS — I69151 Hemiplegia and hemiparesis following nontraumatic intracerebral hemorrhage affecting right dominant side: Secondary | ICD-10-CM | POA: Diagnosis not present

## 2016-03-19 DIAGNOSIS — G8111 Spastic hemiplegia affecting right dominant side: Secondary | ICD-10-CM | POA: Diagnosis not present

## 2016-03-19 DIAGNOSIS — I613 Nontraumatic intracerebral hemorrhage in brain stem: Secondary | ICD-10-CM | POA: Diagnosis not present

## 2016-03-19 DIAGNOSIS — M6389 Disorders of muscle in diseases classified elsewhere, multiple sites: Secondary | ICD-10-CM | POA: Diagnosis not present

## 2016-03-19 DIAGNOSIS — R601 Generalized edema: Secondary | ICD-10-CM | POA: Diagnosis not present

## 2016-03-19 DIAGNOSIS — I69120 Aphasia following nontraumatic intracerebral hemorrhage: Secondary | ICD-10-CM | POA: Diagnosis not present

## 2016-03-19 DIAGNOSIS — R278 Other lack of coordination: Secondary | ICD-10-CM | POA: Diagnosis not present

## 2016-03-19 DIAGNOSIS — I69151 Hemiplegia and hemiparesis following nontraumatic intracerebral hemorrhage affecting right dominant side: Secondary | ICD-10-CM | POA: Diagnosis not present

## 2016-03-22 DIAGNOSIS — G8111 Spastic hemiplegia affecting right dominant side: Secondary | ICD-10-CM | POA: Diagnosis not present

## 2016-03-22 DIAGNOSIS — I69151 Hemiplegia and hemiparesis following nontraumatic intracerebral hemorrhage affecting right dominant side: Secondary | ICD-10-CM | POA: Diagnosis not present

## 2016-03-22 DIAGNOSIS — R278 Other lack of coordination: Secondary | ICD-10-CM | POA: Diagnosis not present

## 2016-03-22 DIAGNOSIS — I69120 Aphasia following nontraumatic intracerebral hemorrhage: Secondary | ICD-10-CM | POA: Diagnosis not present

## 2016-03-22 DIAGNOSIS — M6389 Disorders of muscle in diseases classified elsewhere, multiple sites: Secondary | ICD-10-CM | POA: Diagnosis not present

## 2016-03-22 DIAGNOSIS — R601 Generalized edema: Secondary | ICD-10-CM | POA: Diagnosis not present

## 2016-03-23 DIAGNOSIS — I69120 Aphasia following nontraumatic intracerebral hemorrhage: Secondary | ICD-10-CM | POA: Diagnosis not present

## 2016-03-23 DIAGNOSIS — G8111 Spastic hemiplegia affecting right dominant side: Secondary | ICD-10-CM | POA: Diagnosis not present

## 2016-03-23 DIAGNOSIS — I69151 Hemiplegia and hemiparesis following nontraumatic intracerebral hemorrhage affecting right dominant side: Secondary | ICD-10-CM | POA: Diagnosis not present

## 2016-03-23 DIAGNOSIS — M6389 Disorders of muscle in diseases classified elsewhere, multiple sites: Secondary | ICD-10-CM | POA: Diagnosis not present

## 2016-03-23 DIAGNOSIS — R601 Generalized edema: Secondary | ICD-10-CM | POA: Diagnosis not present

## 2016-03-23 DIAGNOSIS — R278 Other lack of coordination: Secondary | ICD-10-CM | POA: Diagnosis not present

## 2016-03-26 DIAGNOSIS — I69120 Aphasia following nontraumatic intracerebral hemorrhage: Secondary | ICD-10-CM | POA: Diagnosis not present

## 2016-03-26 DIAGNOSIS — M6389 Disorders of muscle in diseases classified elsewhere, multiple sites: Secondary | ICD-10-CM | POA: Diagnosis not present

## 2016-03-26 DIAGNOSIS — R278 Other lack of coordination: Secondary | ICD-10-CM | POA: Diagnosis not present

## 2016-03-26 DIAGNOSIS — I69151 Hemiplegia and hemiparesis following nontraumatic intracerebral hemorrhage affecting right dominant side: Secondary | ICD-10-CM | POA: Diagnosis not present

## 2016-03-26 DIAGNOSIS — R601 Generalized edema: Secondary | ICD-10-CM | POA: Diagnosis not present

## 2016-03-26 DIAGNOSIS — G8111 Spastic hemiplegia affecting right dominant side: Secondary | ICD-10-CM | POA: Diagnosis not present

## 2016-03-29 ENCOUNTER — Encounter: Payer: Medicare Other | Attending: Physical Medicine & Rehabilitation | Admitting: Physical Medicine & Rehabilitation

## 2016-03-29 ENCOUNTER — Encounter: Payer: Self-pay | Admitting: Physical Medicine & Rehabilitation

## 2016-03-29 VITALS — BP 171/90 | HR 64

## 2016-03-29 DIAGNOSIS — Z5189 Encounter for other specified aftercare: Secondary | ICD-10-CM | POA: Insufficient documentation

## 2016-03-29 DIAGNOSIS — I69151 Hemiplegia and hemiparesis following nontraumatic intracerebral hemorrhage affecting right dominant side: Secondary | ICD-10-CM | POA: Diagnosis not present

## 2016-03-29 DIAGNOSIS — R5383 Other fatigue: Secondary | ICD-10-CM | POA: Diagnosis not present

## 2016-03-29 DIAGNOSIS — I69251 Hemiplegia and hemiparesis following other nontraumatic intracranial hemorrhage affecting right dominant side: Secondary | ICD-10-CM | POA: Diagnosis not present

## 2016-03-29 DIAGNOSIS — R200 Anesthesia of skin: Secondary | ICD-10-CM | POA: Insufficient documentation

## 2016-03-29 DIAGNOSIS — E785 Hyperlipidemia, unspecified: Secondary | ICD-10-CM | POA: Insufficient documentation

## 2016-03-29 DIAGNOSIS — I69191 Dysphagia following nontraumatic intracerebral hemorrhage: Secondary | ICD-10-CM | POA: Diagnosis not present

## 2016-03-29 DIAGNOSIS — K589 Irritable bowel syndrome without diarrhea: Secondary | ICD-10-CM | POA: Insufficient documentation

## 2016-03-29 DIAGNOSIS — I69320 Aphasia following cerebral infarction: Secondary | ICD-10-CM

## 2016-03-29 DIAGNOSIS — G8110 Spastic hemiplegia affecting unspecified side: Secondary | ICD-10-CM | POA: Diagnosis not present

## 2016-03-29 DIAGNOSIS — G8111 Spastic hemiplegia affecting right dominant side: Secondary | ICD-10-CM | POA: Diagnosis not present

## 2016-03-29 DIAGNOSIS — I69920 Aphasia following unspecified cerebrovascular disease: Secondary | ICD-10-CM | POA: Diagnosis not present

## 2016-03-29 DIAGNOSIS — I10 Essential (primary) hypertension: Secondary | ICD-10-CM | POA: Insufficient documentation

## 2016-03-29 DIAGNOSIS — R531 Weakness: Secondary | ICD-10-CM | POA: Insufficient documentation

## 2016-03-29 DIAGNOSIS — R601 Generalized edema: Secondary | ICD-10-CM | POA: Diagnosis not present

## 2016-03-29 DIAGNOSIS — F329 Major depressive disorder, single episode, unspecified: Secondary | ICD-10-CM | POA: Insufficient documentation

## 2016-03-29 DIAGNOSIS — R278 Other lack of coordination: Secondary | ICD-10-CM | POA: Diagnosis not present

## 2016-03-29 DIAGNOSIS — R252 Cramp and spasm: Secondary | ICD-10-CM | POA: Diagnosis not present

## 2016-03-29 DIAGNOSIS — I69120 Aphasia following nontraumatic intracerebral hemorrhage: Secondary | ICD-10-CM | POA: Diagnosis not present

## 2016-03-29 DIAGNOSIS — M6389 Disorders of muscle in diseases classified elsewhere, multiple sites: Secondary | ICD-10-CM | POA: Diagnosis not present

## 2016-03-29 MED ORDER — BACLOFEN 20 MG PO TABS: 20.0000 mg | ORAL_TABLET | Freq: Three times a day (TID) | ORAL | 3 refills | Status: DC

## 2016-03-29 MED ORDER — GABAPENTIN 100 MG PO CAPS: 100.0000 mg | ORAL_CAPSULE | Freq: Four times a day (QID) | ORAL | 3 refills | Status: DC

## 2016-03-29 NOTE — Progress Notes (Signed)
Subjective:    Patient ID: [name], female    DOB: 09-30-1940, 75 y.o.   MRN: [id number]  HPI  [name] is here in follow up of her CVA and spastic right hemiparesis. [name] had good results with botox in October. [name] has begun to develop further recurrent tone in the wrist and fingers however. Husband has also noticed extensor tone in the righ tleg which has affected ambulation and transfers.   [name] continues on baclofen 10mg  QID as well as gabapentin 100-200-200-100 daily. [name] denies any pain at present. [name] hasn't had any falls.     Pain Inventory Average Pain 0 Pain Right Now 0 My pain is intermittent  In the last 24 hours, has pain interfered with the following? General activity 0 Relation with others 0 Enjoyment of life 0 What TIME of day is your pain at its worst? night Sleep (in general) Good  Pain is worse with: inactivity Pain improves with: . Relief from Meds: 0  Mobility walk with assistance ability to climb steps?  no do you drive?  no use a wheelchair needs help with transfers Do you have any goals in this area?  yes  Function not employed: date last employed . I need assistance with the following:  dressing, bathing, toileting, meal prep, household duties and shopping Do you have any goals in this area?  yes  Neuro/Psych weakness numbness trouble walking spasms loss of taste or smell suicidal thoughts  Prior Studies Any changes since last visit?  no  Physicians involved in your care Any changes since last visit?  no   Family History  Problem Relation Age of Onset  . Aortic aneurysm Father   . Heart failure Mother    Social History   Social History  . Marital status: Married    Spouse name: Dr. Genelle Bal  . Number of children: N/A  . Years of education: N/A   Social History Main Topics  . Smoking status: Never Smoker  . Smokeless tobacco: Never Used  . Alcohol use 0.6 oz/week    1 Glasses of wine per week   Comment: occasionally  . Drug use: Unknown  . Sexual activity: Not on file   Other Topics Concern  . Not on file   Social History Narrative  . No narrative on file   Past Surgical History:  Procedure Laterality Date  . APPENDECTOMY    . BREAST LUMPECTOMY     Right-benign  . BRONCHOSCOPY     Video bronch w/ TBBX 2012  . CESAREAN SECTION     x 4  . TONSILLECTOMY    . VESICOVAGINAL FISTULA CLOSURE W/ TAH     Past Medical History:  Diagnosis Date  . Acute renal insufficiency    due to amphotericin 2012  . Allergic rhinitis, cause unspecified   . Bronchitis, not specified as acute or chronic   . Diverticulosis   . 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 Hypertension   . Irritable bowel   . Other chronic allergic conjunctivitis   . Other chronic sinusitis   . Stroke [facility])    There were no vitals taken for this visit.  Opioid Risk Score:   Fall Risk Score:  `1  Depression screen PHQ 2/9  Depression screen PHQ 2/9 10/06/2015  Decreased Interest 0  Down, Depressed, Hopeless 1  PHQ - 2 Score 1  Altered sleeping 3  Tired, decreased energy 0  Change in appetite 0  Feeling  bad or failure about yourself  1  Trouble concentrating 2  Moving slowly or fidgety/restless 3  Suicidal thoughts 0  PHQ-9 Score 10  Difficult doing work/chores Very difficult   Review of Systems  HENT: Negative.   Eyes: Negative.   Respiratory: Negative.   Cardiovascular: Negative.   Gastrointestinal: Negative.   Endocrine: Negative.   Genitourinary: Negative.   Musculoskeletal: Positive for gait problem.       Spasms  Skin: Negative.   Allergic/Immunologic: Negative.   Neurological: Positive for weakness and numbness.       Loss taste/smell  Hematological: Negative.   Psychiatric/Behavioral: Positive for suicidal ideas.       Diminished sucidal thoughts  All other systems reviewed and are negative.      Objective:   Physical Exam  Constitutional: [name] appears  well-developed, well nourished. NAD. HENT: Normocephalic. Atraumatic.  Cardiovascular: Normal rate and regular rhythm. + systolic Murmur Respiratory: Effort normal and breath sounds normal. No respiratory distress. no wheezes or rales GI: PEG site with hypergranulation Musc: 1/2" sublux anterior right shoulder Neurological: speaks in low volume with word finding deficits at times.  Alert.continued verbal apraxia--able to put together sentences and phrases when cued now however.  Processing improved.   DTRs 3+ right upper and right lower extremity  Motor:  Right upper extremity and right lower extremity: 0/5 RLE; 1/5 HAD, KE , trace ADF Left upper and left lower extremity 4/5. Flexor tone RUE---tr-1/4 biceps, PT, 2/4 wrist and finger flexors/thumb, trace only at pecs.  trace extensor tone RLE at quads. Good ankle rom. Marland Kitchen 4-5 beats clonus RLE.  Skin: Skin is warm and dry.  Psych: pleasant and in good spirits.     Assessment & Plan:  Medical Problem List and Plan: 1. 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 Central neuropathic pain: gabapentin 100mg  am 200mg  lunch and dinner and 100mg  bedtime - 4. Spasticity: 300 units botox Right finger and wrist flexors, pecs             -baclofen 10mg  QID--titrate to 20mg  TID             -right knee brace, AFO, dyna splint             -resting WFO and daytime splint to continue.              -will reduce gabapentin to 100mg  QID after taper 5. Neuropsych: This patient is not capable of making decisions on her own behalf. -off celexa -remeron 6. Hypertension. Cozaar 100 mg daily. Some elevation today.   -monitor regularly at home. No changes in regeimn

## 2016-03-29 NOTE — Patient Instructions (Signed)
BACLOFEN:   10- 10- 10- 20 FOR 3 DAYS THEN 20-10-20 FOR DAYS  THEN 20MG  TID THEREAFTER.   IF THERE ARE NO TOLERANCE ISSUES, WE CAN CONSIDER  QID DOSING IF NEEDED.     GABAPENTIN:  100-100-200-100MG  FOR 3 DAYS THEN 100MG  QID THEREAFTER   PLEASE CALL ME WITH ANY PROBLEMS OR QUESTIONS [id number])   HAPPY HOLIDAYS!!!!                    *                * *             *   *   *         *  *   *  *  *     *  *  *  *  *  *  * *  *  *  *  *  *  *  *  *  * *               *  *               *  *               *  *

## 2016-03-30 DIAGNOSIS — I69151 Hemiplegia and hemiparesis following nontraumatic intracerebral hemorrhage affecting right dominant side: Secondary | ICD-10-CM | POA: Diagnosis not present

## 2016-03-30 DIAGNOSIS — I69120 Aphasia following nontraumatic intracerebral hemorrhage: Secondary | ICD-10-CM | POA: Diagnosis not present

## 2016-03-30 DIAGNOSIS — M6389 Disorders of muscle in diseases classified elsewhere, multiple sites: Secondary | ICD-10-CM | POA: Diagnosis not present

## 2016-03-30 DIAGNOSIS — R601 Generalized edema: Secondary | ICD-10-CM | POA: Diagnosis not present

## 2016-03-30 DIAGNOSIS — G8111 Spastic hemiplegia affecting right dominant side: Secondary | ICD-10-CM | POA: Diagnosis not present

## 2016-03-30 DIAGNOSIS — R278 Other lack of coordination: Secondary | ICD-10-CM | POA: Diagnosis not present

## 2016-03-31 DIAGNOSIS — G8111 Spastic hemiplegia affecting right dominant side: Secondary | ICD-10-CM | POA: Diagnosis not present

## 2016-03-31 DIAGNOSIS — I69120 Aphasia following nontraumatic intracerebral hemorrhage: Secondary | ICD-10-CM | POA: Diagnosis not present

## 2016-03-31 DIAGNOSIS — R601 Generalized edema: Secondary | ICD-10-CM | POA: Diagnosis not present

## 2016-03-31 DIAGNOSIS — I69151 Hemiplegia and hemiparesis following nontraumatic intracerebral hemorrhage affecting right dominant side: Secondary | ICD-10-CM | POA: Diagnosis not present

## 2016-03-31 DIAGNOSIS — R278 Other lack of coordination: Secondary | ICD-10-CM | POA: Diagnosis not present

## 2016-03-31 DIAGNOSIS — M6389 Disorders of muscle in diseases classified elsewhere, multiple sites: Secondary | ICD-10-CM | POA: Diagnosis not present

## 2016-04-01 DIAGNOSIS — M6389 Disorders of muscle in diseases classified elsewhere, multiple sites: Secondary | ICD-10-CM | POA: Diagnosis not present

## 2016-04-01 DIAGNOSIS — I69120 Aphasia following nontraumatic intracerebral hemorrhage: Secondary | ICD-10-CM | POA: Diagnosis not present

## 2016-04-01 DIAGNOSIS — R278 Other lack of coordination: Secondary | ICD-10-CM | POA: Diagnosis not present

## 2016-04-01 DIAGNOSIS — I69151 Hemiplegia and hemiparesis following nontraumatic intracerebral hemorrhage affecting right dominant side: Secondary | ICD-10-CM | POA: Diagnosis not present

## 2016-04-01 DIAGNOSIS — R601 Generalized edema: Secondary | ICD-10-CM | POA: Diagnosis not present

## 2016-04-01 DIAGNOSIS — G8111 Spastic hemiplegia affecting right dominant side: Secondary | ICD-10-CM | POA: Diagnosis not present

## 2016-04-02 DIAGNOSIS — I69120 Aphasia following nontraumatic intracerebral hemorrhage: Secondary | ICD-10-CM | POA: Diagnosis not present

## 2016-04-02 DIAGNOSIS — G8111 Spastic hemiplegia affecting right dominant side: Secondary | ICD-10-CM | POA: Diagnosis not present

## 2016-04-02 DIAGNOSIS — R278 Other lack of coordination: Secondary | ICD-10-CM | POA: Diagnosis not present

## 2016-04-02 DIAGNOSIS — M6389 Disorders of muscle in diseases classified elsewhere, multiple sites: Secondary | ICD-10-CM | POA: Diagnosis not present

## 2016-04-02 DIAGNOSIS — I69151 Hemiplegia and hemiparesis following nontraumatic intracerebral hemorrhage affecting right dominant side: Secondary | ICD-10-CM | POA: Diagnosis not present

## 2016-04-02 DIAGNOSIS — R601 Generalized edema: Secondary | ICD-10-CM | POA: Diagnosis not present

## 2016-04-05 DIAGNOSIS — M6389 Disorders of muscle in diseases classified elsewhere, multiple sites: Secondary | ICD-10-CM | POA: Diagnosis not present

## 2016-04-05 DIAGNOSIS — R601 Generalized edema: Secondary | ICD-10-CM | POA: Diagnosis not present

## 2016-04-05 DIAGNOSIS — R278 Other lack of coordination: Secondary | ICD-10-CM | POA: Diagnosis not present

## 2016-04-05 DIAGNOSIS — G8111 Spastic hemiplegia affecting right dominant side: Secondary | ICD-10-CM | POA: Diagnosis not present

## 2016-04-05 DIAGNOSIS — I69120 Aphasia following nontraumatic intracerebral hemorrhage: Secondary | ICD-10-CM | POA: Diagnosis not present

## 2016-04-05 DIAGNOSIS — I69151 Hemiplegia and hemiparesis following nontraumatic intracerebral hemorrhage affecting right dominant side: Secondary | ICD-10-CM | POA: Diagnosis not present

## 2016-04-06 DIAGNOSIS — I69151 Hemiplegia and hemiparesis following nontraumatic intracerebral hemorrhage affecting right dominant side: Secondary | ICD-10-CM | POA: Diagnosis not present

## 2016-04-06 DIAGNOSIS — G8111 Spastic hemiplegia affecting right dominant side: Secondary | ICD-10-CM | POA: Diagnosis not present

## 2016-04-06 DIAGNOSIS — R278 Other lack of coordination: Secondary | ICD-10-CM | POA: Diagnosis not present

## 2016-04-06 DIAGNOSIS — I69120 Aphasia following nontraumatic intracerebral hemorrhage: Secondary | ICD-10-CM | POA: Diagnosis not present

## 2016-04-06 DIAGNOSIS — M6389 Disorders of muscle in diseases classified elsewhere, multiple sites: Secondary | ICD-10-CM | POA: Diagnosis not present

## 2016-04-06 DIAGNOSIS — R601 Generalized edema: Secondary | ICD-10-CM | POA: Diagnosis not present

## 2016-04-07 DIAGNOSIS — I69151 Hemiplegia and hemiparesis following nontraumatic intracerebral hemorrhage affecting right dominant side: Secondary | ICD-10-CM | POA: Diagnosis not present

## 2016-04-07 DIAGNOSIS — G8111 Spastic hemiplegia affecting right dominant side: Secondary | ICD-10-CM | POA: Diagnosis not present

## 2016-04-07 DIAGNOSIS — I69120 Aphasia following nontraumatic intracerebral hemorrhage: Secondary | ICD-10-CM | POA: Diagnosis not present

## 2016-04-07 DIAGNOSIS — R601 Generalized edema: Secondary | ICD-10-CM | POA: Diagnosis not present

## 2016-04-07 DIAGNOSIS — R278 Other lack of coordination: Secondary | ICD-10-CM | POA: Diagnosis not present

## 2016-04-07 DIAGNOSIS — M6389 Disorders of muscle in diseases classified elsewhere, multiple sites: Secondary | ICD-10-CM | POA: Diagnosis not present

## 2016-04-09 DIAGNOSIS — R601 Generalized edema: Secondary | ICD-10-CM | POA: Diagnosis not present

## 2016-04-09 DIAGNOSIS — R278 Other lack of coordination: Secondary | ICD-10-CM | POA: Diagnosis not present

## 2016-04-09 DIAGNOSIS — G8111 Spastic hemiplegia affecting right dominant side: Secondary | ICD-10-CM | POA: Diagnosis not present

## 2016-04-09 DIAGNOSIS — M6389 Disorders of muscle in diseases classified elsewhere, multiple sites: Secondary | ICD-10-CM | POA: Diagnosis not present

## 2016-04-09 DIAGNOSIS — I69120 Aphasia following nontraumatic intracerebral hemorrhage: Secondary | ICD-10-CM | POA: Diagnosis not present

## 2016-04-09 DIAGNOSIS — I69151 Hemiplegia and hemiparesis following nontraumatic intracerebral hemorrhage affecting right dominant side: Secondary | ICD-10-CM | POA: Diagnosis not present

## 2016-04-14 DIAGNOSIS — I69120 Aphasia following nontraumatic intracerebral hemorrhage: Secondary | ICD-10-CM | POA: Diagnosis not present

## 2016-04-14 DIAGNOSIS — R278 Other lack of coordination: Secondary | ICD-10-CM | POA: Diagnosis not present

## 2016-04-14 DIAGNOSIS — M6389 Disorders of muscle in diseases classified elsewhere, multiple sites: Secondary | ICD-10-CM | POA: Diagnosis not present

## 2016-04-14 DIAGNOSIS — I69151 Hemiplegia and hemiparesis following nontraumatic intracerebral hemorrhage affecting right dominant side: Secondary | ICD-10-CM | POA: Diagnosis not present

## 2016-04-14 DIAGNOSIS — G8111 Spastic hemiplegia affecting right dominant side: Secondary | ICD-10-CM | POA: Diagnosis not present

## 2016-04-14 DIAGNOSIS — R601 Generalized edema: Secondary | ICD-10-CM | POA: Diagnosis not present

## 2016-04-15 DIAGNOSIS — R278 Other lack of coordination: Secondary | ICD-10-CM | POA: Diagnosis not present

## 2016-04-15 DIAGNOSIS — I69120 Aphasia following nontraumatic intracerebral hemorrhage: Secondary | ICD-10-CM | POA: Diagnosis not present

## 2016-04-15 DIAGNOSIS — I69151 Hemiplegia and hemiparesis following nontraumatic intracerebral hemorrhage affecting right dominant side: Secondary | ICD-10-CM | POA: Diagnosis not present

## 2016-04-15 DIAGNOSIS — G8111 Spastic hemiplegia affecting right dominant side: Secondary | ICD-10-CM | POA: Diagnosis not present

## 2016-04-15 DIAGNOSIS — R601 Generalized edema: Secondary | ICD-10-CM | POA: Diagnosis not present

## 2016-04-15 DIAGNOSIS — M6389 Disorders of muscle in diseases classified elsewhere, multiple sites: Secondary | ICD-10-CM | POA: Diagnosis not present

## 2016-04-16 DIAGNOSIS — I69120 Aphasia following nontraumatic intracerebral hemorrhage: Secondary | ICD-10-CM | POA: Diagnosis not present

## 2016-04-16 DIAGNOSIS — R601 Generalized edema: Secondary | ICD-10-CM | POA: Diagnosis not present

## 2016-04-16 DIAGNOSIS — M6389 Disorders of muscle in diseases classified elsewhere, multiple sites: Secondary | ICD-10-CM | POA: Diagnosis not present

## 2016-04-16 DIAGNOSIS — I69151 Hemiplegia and hemiparesis following nontraumatic intracerebral hemorrhage affecting right dominant side: Secondary | ICD-10-CM | POA: Diagnosis not present

## 2016-04-16 DIAGNOSIS — G8111 Spastic hemiplegia affecting right dominant side: Secondary | ICD-10-CM | POA: Diagnosis not present

## 2016-04-16 DIAGNOSIS — R278 Other lack of coordination: Secondary | ICD-10-CM | POA: Diagnosis not present

## 2016-04-20 DIAGNOSIS — R601 Generalized edema: Secondary | ICD-10-CM | POA: Diagnosis not present

## 2016-04-20 DIAGNOSIS — R278 Other lack of coordination: Secondary | ICD-10-CM | POA: Diagnosis not present

## 2016-04-20 DIAGNOSIS — I69120 Aphasia following nontraumatic intracerebral hemorrhage: Secondary | ICD-10-CM | POA: Diagnosis not present

## 2016-04-20 DIAGNOSIS — I69151 Hemiplegia and hemiparesis following nontraumatic intracerebral hemorrhage affecting right dominant side: Secondary | ICD-10-CM | POA: Diagnosis not present

## 2016-04-20 DIAGNOSIS — M6389 Disorders of muscle in diseases classified elsewhere, multiple sites: Secondary | ICD-10-CM | POA: Diagnosis not present

## 2016-04-20 DIAGNOSIS — I613 Nontraumatic intracerebral hemorrhage in brain stem: Secondary | ICD-10-CM | POA: Diagnosis not present

## 2016-04-20 DIAGNOSIS — G8111 Spastic hemiplegia affecting right dominant side: Secondary | ICD-10-CM | POA: Diagnosis not present

## 2016-04-21 DIAGNOSIS — M6389 Disorders of muscle in diseases classified elsewhere, multiple sites: Secondary | ICD-10-CM | POA: Diagnosis not present

## 2016-04-21 DIAGNOSIS — R278 Other lack of coordination: Secondary | ICD-10-CM | POA: Diagnosis not present

## 2016-04-21 DIAGNOSIS — R601 Generalized edema: Secondary | ICD-10-CM | POA: Diagnosis not present

## 2016-04-21 DIAGNOSIS — G8111 Spastic hemiplegia affecting right dominant side: Secondary | ICD-10-CM | POA: Diagnosis not present

## 2016-04-21 DIAGNOSIS — I69151 Hemiplegia and hemiparesis following nontraumatic intracerebral hemorrhage affecting right dominant side: Secondary | ICD-10-CM | POA: Diagnosis not present

## 2016-04-21 DIAGNOSIS — I69120 Aphasia following nontraumatic intracerebral hemorrhage: Secondary | ICD-10-CM | POA: Diagnosis not present

## 2016-04-22 DIAGNOSIS — R601 Generalized edema: Secondary | ICD-10-CM | POA: Diagnosis not present

## 2016-04-22 DIAGNOSIS — R278 Other lack of coordination: Secondary | ICD-10-CM | POA: Diagnosis not present

## 2016-04-22 DIAGNOSIS — M6389 Disorders of muscle in diseases classified elsewhere, multiple sites: Secondary | ICD-10-CM | POA: Diagnosis not present

## 2016-04-22 DIAGNOSIS — G8111 Spastic hemiplegia affecting right dominant side: Secondary | ICD-10-CM | POA: Diagnosis not present

## 2016-04-22 DIAGNOSIS — I69151 Hemiplegia and hemiparesis following nontraumatic intracerebral hemorrhage affecting right dominant side: Secondary | ICD-10-CM | POA: Diagnosis not present

## 2016-04-22 DIAGNOSIS — I69120 Aphasia following nontraumatic intracerebral hemorrhage: Secondary | ICD-10-CM | POA: Diagnosis not present

## 2016-04-23 DIAGNOSIS — M6389 Disorders of muscle in diseases classified elsewhere, multiple sites: Secondary | ICD-10-CM | POA: Diagnosis not present

## 2016-04-23 DIAGNOSIS — R601 Generalized edema: Secondary | ICD-10-CM | POA: Diagnosis not present

## 2016-04-23 DIAGNOSIS — G8111 Spastic hemiplegia affecting right dominant side: Secondary | ICD-10-CM | POA: Diagnosis not present

## 2016-04-23 DIAGNOSIS — R278 Other lack of coordination: Secondary | ICD-10-CM | POA: Diagnosis not present

## 2016-04-23 DIAGNOSIS — I69151 Hemiplegia and hemiparesis following nontraumatic intracerebral hemorrhage affecting right dominant side: Secondary | ICD-10-CM | POA: Diagnosis not present

## 2016-04-23 DIAGNOSIS — I69120 Aphasia following nontraumatic intracerebral hemorrhage: Secondary | ICD-10-CM | POA: Diagnosis not present

## 2016-04-26 DIAGNOSIS — G8111 Spastic hemiplegia affecting right dominant side: Secondary | ICD-10-CM | POA: Diagnosis not present

## 2016-04-26 DIAGNOSIS — M6389 Disorders of muscle in diseases classified elsewhere, multiple sites: Secondary | ICD-10-CM | POA: Diagnosis not present

## 2016-04-26 DIAGNOSIS — I69151 Hemiplegia and hemiparesis following nontraumatic intracerebral hemorrhage affecting right dominant side: Secondary | ICD-10-CM | POA: Diagnosis not present

## 2016-04-26 DIAGNOSIS — I69120 Aphasia following nontraumatic intracerebral hemorrhage: Secondary | ICD-10-CM | POA: Diagnosis not present

## 2016-04-26 DIAGNOSIS — R601 Generalized edema: Secondary | ICD-10-CM | POA: Diagnosis not present

## 2016-04-26 DIAGNOSIS — R278 Other lack of coordination: Secondary | ICD-10-CM | POA: Diagnosis not present

## 2016-04-27 DIAGNOSIS — R601 Generalized edema: Secondary | ICD-10-CM | POA: Diagnosis not present

## 2016-04-27 DIAGNOSIS — I69151 Hemiplegia and hemiparesis following nontraumatic intracerebral hemorrhage affecting right dominant side: Secondary | ICD-10-CM | POA: Diagnosis not present

## 2016-04-27 DIAGNOSIS — R278 Other lack of coordination: Secondary | ICD-10-CM | POA: Diagnosis not present

## 2016-04-27 DIAGNOSIS — G8111 Spastic hemiplegia affecting right dominant side: Secondary | ICD-10-CM | POA: Diagnosis not present

## 2016-04-27 DIAGNOSIS — I69120 Aphasia following nontraumatic intracerebral hemorrhage: Secondary | ICD-10-CM | POA: Diagnosis not present

## 2016-04-27 DIAGNOSIS — M6389 Disorders of muscle in diseases classified elsewhere, multiple sites: Secondary | ICD-10-CM | POA: Diagnosis not present

## 2016-04-28 DIAGNOSIS — R278 Other lack of coordination: Secondary | ICD-10-CM | POA: Diagnosis not present

## 2016-04-28 DIAGNOSIS — G8111 Spastic hemiplegia affecting right dominant side: Secondary | ICD-10-CM | POA: Diagnosis not present

## 2016-04-28 DIAGNOSIS — I69120 Aphasia following nontraumatic intracerebral hemorrhage: Secondary | ICD-10-CM | POA: Diagnosis not present

## 2016-04-28 DIAGNOSIS — I69151 Hemiplegia and hemiparesis following nontraumatic intracerebral hemorrhage affecting right dominant side: Secondary | ICD-10-CM | POA: Diagnosis not present

## 2016-04-28 DIAGNOSIS — R601 Generalized edema: Secondary | ICD-10-CM | POA: Diagnosis not present

## 2016-04-28 DIAGNOSIS — M6389 Disorders of muscle in diseases classified elsewhere, multiple sites: Secondary | ICD-10-CM | POA: Diagnosis not present

## 2016-04-29 DIAGNOSIS — R601 Generalized edema: Secondary | ICD-10-CM | POA: Diagnosis not present

## 2016-04-29 DIAGNOSIS — I69120 Aphasia following nontraumatic intracerebral hemorrhage: Secondary | ICD-10-CM | POA: Diagnosis not present

## 2016-04-29 DIAGNOSIS — I69151 Hemiplegia and hemiparesis following nontraumatic intracerebral hemorrhage affecting right dominant side: Secondary | ICD-10-CM | POA: Diagnosis not present

## 2016-04-29 DIAGNOSIS — M6389 Disorders of muscle in diseases classified elsewhere, multiple sites: Secondary | ICD-10-CM | POA: Diagnosis not present

## 2016-04-29 DIAGNOSIS — R278 Other lack of coordination: Secondary | ICD-10-CM | POA: Diagnosis not present

## 2016-04-29 DIAGNOSIS — G8111 Spastic hemiplegia affecting right dominant side: Secondary | ICD-10-CM | POA: Diagnosis not present

## 2016-04-30 DIAGNOSIS — M6389 Disorders of muscle in diseases classified elsewhere, multiple sites: Secondary | ICD-10-CM | POA: Diagnosis not present

## 2016-04-30 DIAGNOSIS — G8111 Spastic hemiplegia affecting right dominant side: Secondary | ICD-10-CM | POA: Diagnosis not present

## 2016-04-30 DIAGNOSIS — R278 Other lack of coordination: Secondary | ICD-10-CM | POA: Diagnosis not present

## 2016-04-30 DIAGNOSIS — I69120 Aphasia following nontraumatic intracerebral hemorrhage: Secondary | ICD-10-CM | POA: Diagnosis not present

## 2016-04-30 DIAGNOSIS — I69151 Hemiplegia and hemiparesis following nontraumatic intracerebral hemorrhage affecting right dominant side: Secondary | ICD-10-CM | POA: Diagnosis not present

## 2016-04-30 DIAGNOSIS — R601 Generalized edema: Secondary | ICD-10-CM | POA: Diagnosis not present

## 2016-05-03 ENCOUNTER — Ambulatory Visit (INDEPENDENT_AMBULATORY_CARE_PROVIDER_SITE_OTHER): Payer: Medicare Other | Admitting: Neurology

## 2016-05-03 ENCOUNTER — Encounter: Payer: Self-pay | Admitting: Neurology

## 2016-05-03 VITALS — BP 140/82 | HR 64

## 2016-05-03 DIAGNOSIS — I69120 Aphasia following nontraumatic intracerebral hemorrhage: Secondary | ICD-10-CM | POA: Diagnosis not present

## 2016-05-03 DIAGNOSIS — G8111 Spastic hemiplegia affecting right dominant side: Secondary | ICD-10-CM | POA: Diagnosis not present

## 2016-05-03 DIAGNOSIS — M6389 Disorders of muscle in diseases classified elsewhere, multiple sites: Secondary | ICD-10-CM | POA: Diagnosis not present

## 2016-05-03 DIAGNOSIS — G8110 Spastic hemiplegia affecting unspecified side: Secondary | ICD-10-CM

## 2016-05-03 DIAGNOSIS — R278 Other lack of coordination: Secondary | ICD-10-CM | POA: Diagnosis not present

## 2016-05-03 DIAGNOSIS — R601 Generalized edema: Secondary | ICD-10-CM | POA: Diagnosis not present

## 2016-05-03 DIAGNOSIS — I69151 Hemiplegia and hemiparesis following nontraumatic intracerebral hemorrhage affecting right dominant side: Secondary | ICD-10-CM | POA: Diagnosis not present

## 2016-05-03 NOTE — Patient Instructions (Addendum)
I had a long d/w patient and her husband Dr London Pepper about her remote hemorrhagic stroke and post stroke spasticity risk for recurrent stroke/TIAs, personally independently reviewed imaging studies and stroke evaluation results and answered questions.Continue  to maintain strict control of hypertension with blood pressure goal below 130/90, diabetes with hemoglobin A1c goal below 6.5% and lipids with LDL cholesterol goal below 70 mg/dL. check EEG for seizure activity. Continue gabapentin 100 mg 4 times daily for post stroke spasticity and spasms. Increase baclofen to 20 mg 4 times daily gradually as tolerated. Follow-up with Dr. Tessa Lerner for Botox for spasticity.  Followup in the future with me in one year or call earlier if necessary.

## 2016-05-03 NOTE — Progress Notes (Signed)
[facility] [address] [place]. [place] [address] [phone]       OFFICE FOLLOW-UP NOTE  Ms. SAMADHI GODING Date of Birth:  [birth year] Medical Record Number:  [id number]   HPI: [name] is a 76 year pleasant Caucasian lady seen today for first office follow-up visit for hospital admission for intracerebral hemorrhage in April 2017. [name] is an 76 y.o. female with a history of hypertension, brought to the emergency room in Code stroke status following acute onset of right-sided weakness and speech difficulty. She was last known well at 5:20 PM today. She has no previous history of stroke or TIA. CT scan of her head with a large left basal ganglia hemorrhage with no extension into left lateral ventricle. Blood pressure was 168/89. Patient was unable to speak and was neglecting her right side, with a gaze to the left. She had no voluntary movement of right extremities. NIH stroke score was 25. LSN: 5:20 PM on 08/01/2015 tPA Given: No: Acute ICH  she was admitted to intensive care unit and blood pressure was tightly controlled. MRI scan of the brain done on 08/02/15 showed increased size of the intraparenchymal hemorrhage centered around left basal ganglia now measuring 5.6 x 3.3 x 3.6 cm for an estimated volume of 33 cubic cc. No associated left to right midline shift. She was closely neurologically monitored and remained stable with follow-up serial imaging showing stable appearance. She remained aphasic with dense right hemiplegia on exam. She was seen by physical occupational therapy as well as rehabilitation medicine for consult and was transferred to inpatient rehabilitation. She had urinary tract infection as well as some in the [place] abnormalities that but gradually made improvement. She was transferred to pain even for rehabilitation but is currently they're in the skilled facility. She is still getting physical occupational speech therapy daily but she has  unfortunately not been significant improvement. She is able to speak short words and sentences but her comprehension has improved but language and hasn't not improved so much. She still has no significant strength on the right side and has dense hemiplegia. She is able to stand with Lexapro and can help with transfers but is not able to walk a whole lot. She has able to swallow better and hyperactive as come out but she did husband feels she does not eat enough. She had some spasms in her right leg recently and saw Dr. Tessa Lerner who added gabapentin which seems to be helping. The patient's husband plans to move into an apartment in [place] and wants his wife to move up there soon. Blood pressure has remained in the [id number] systolic range and could do better. She is getting baclofen 10 mg 3 times daily for spasticity. Update 05/03/2016 : She returns for follow-up after last visit with me 6 months ago. She is accompanied by husband Dr. London Pepper. They have now moved into the independent living apartment. Patient has 24-hour caregiver during the day. She is not able to walk with a hemiwalker with the need and a foot brace. She requires close supervision however. There have been no falls or injuries. She has noticed some benefit in her muscle cramps and spasms after addition of baclofen to gabapentin. She is slowly tolerating increasing doses of baclofen and currently takes 20 mg and 10 mg alternately a total of 4 times a day. She is also gotten one dose of Botox injection in the right upper extremity by Dr. Tessa Lerner but they have not been  satisfied with the benefit yet. Her blood pressure has been well controlled and today it is 140/82. She had an episode a few weeks ago when she was working on an exercise bicycle at home with her aide when she couldn't breathe and felt right upper extremity was heavy. This did not last long. She was fully aware of her surroundings and did not have any seizure-like activity. Patient did  have an abnormal EEG when she was hospitalized for intracerebral hemorrhage and the family wonders whether this could be seizure related. She is on gabapentin but on a low-dose of 100 mg 4 times daily. She continues to have expressive language difficulties but is able to communicate with some inference and there has been a slow improvement she still has significant right hemiplegia with spasticity but is now able to walk with a hemiwalker with the knee and ankle brace with some help.  ROS:   14 system review of systems is positive for  leg swelling, constipation, frequent waking, allergies, skin wounds, speech difficulty, facial drooping, decreased concentration, easy distractibility, gait difficulty and all other systems negative  PMH:  Past Medical History:  Diagnosis Date  . Acute renal insufficiency    due to amphotericin 2012  . Allergic rhinitis, cause unspecified   . Bronchitis, not specified as acute or chronic   . Diverticulosis   . 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 Hypertension   . Irritable bowel   . Other chronic allergic conjunctivitis   . Other chronic sinusitis   . Stroke [facility])     Social History:  Social History   Social History  . Marital status: Married    Spouse name: Dr. Genelle Bal  . Number of children: N/A  . Years of education: N/A   Occupational History  . Not on file.   Social History Main Topics  . Smoking status: Never Smoker  . Smokeless tobacco: Never Used  . Alcohol use 0.6 oz/week    1 Glasses of wine per week     Comment: occasionally  . Drug use: No  . Sexual activity: Not on file   Other Topics Concern  . Not on file   Social History Narrative  . No narrative on file    Medications:   Current Outpatient Prescriptions on File Prior to Visit  Medication Sig Dispense Refill  . acetaminophen (TYLENOL) 325 MG tablet Take 650 mg by mouth every 6 (six) hours as needed.    Marland Kitchen atorvastatin (LIPITOR) 20 MG tablet Take 20  mg by mouth at bedtime.   3  . baclofen (LIORESAL) 20 MG tablet Take 1 tablet (20 mg total) by mouth 3 (three) times daily. (Patient taking differently: Take 20 mg by mouth 3 (three) times daily. ) 90 tablet 3  . cycloSPORINE (RESTASIS) 0.05 % ophthalmic emulsion Place 1 drop into both eyes.    Marland Kitchen gabapentin (NEURONTIN) 100 MG capsule Take 1 capsule (100 mg total) by mouth 4 (four) times daily. 120 capsule 3  . latanoprost (XALATAN) 0.005 % ophthalmic solution 1 drop at bedtime.    Marland Kitchen loratadine (CLARITIN) 10 MG tablet Take 10 mg by mouth daily.    Marland Kitchen losartan (COZAAR) 100 MG tablet Take 100 mg by mouth at bedtime.   3  . Melatonin 1 MG TABS Take by mouth.    . mirtazapine (REMERON) 15 MG tablet Take 1 tablet (15 mg total) by mouth at bedtime. 3 tablet 0  . Olopatadine HCl (PATADAY) 0.2 %  SOLN Place 1 drop into both eyes every 12 (twelve) hours as needed (seasonal allergies).    . Probiotic Product (ALIGN) 4 MG CAPS Take 4 mg by mouth daily.     . sennosides-docusate sodium (SENOKOT-S) 8.6-50 MG tablet Take 1 tablet by mouth daily.     No current facility-administered medications on file prior to visit.     Allergies:   Allergies  Allergen Reactions  . Penicillins Hives and Rash    Has patient had a PCN reaction causing immediate rash, facial/tongue/throat swelling, SOB or lightheadedness with hypotension: Yes Has patient had a PCN reaction causing severe rash involving mucus membranes or skin necrosis: No Has patient had a PCN reaction that required hospitalization unknown Has patient had a PCN reaction occurring within the last 10 years: No If all of the above answers are "NO", then may proceed with Cephalosporin use.    Physical Exam General: Frail elderly Caucasian lady seated, in no evident distress Head: head normocephalic and atraumatic.  Neck: supple with no carotid or supraclavicular bruits Cardiovascular: regular rate and rhythm, soft ejection systolicmurmur Musculoskeletal: no  deformity Skin:  no rash/petichiae Vascular:  Normal pulses all extremities Vitals:   05/03/16 1020  BP: 140/82  Pulse: 64   Neurologic Exam Mental Status: Awake and fully alert. Severe expressive aphasia and can speak only short sentences. Able to name and comprehend much better.. Mood and affect appropriate.  Cranial Nerves: Fundoscopic exam not done. Pupils equal, briskly reactive to light. Extraocular movements full without nystagmus. Visual fields Decreased blink to threat on the right to confrontation. Hearing intact. Facial sensation intact. Moderate right lower Face weakness.. Tongue, palate moves normally and symmetrically.  Motor: Normal bulk and tone. Normal strength in all tested extremity muscles on the left side but dense right hemiplegia with 0/5 strength. Spasticity in the right leg with increased tone and ill sustained right ankle clonus..Rt foot drop and right hand nonfixed flexion contractures with atrophy Sensory.: intact to touch ,pinprick .position and vibratory sensation.  Coordination: Rapid alternating movements normal in left extremities. And cannot test on the right side  Gait and Station: Not tested as patient unable to walk without hemiwalker Reflexes: 2+ and asymmetric brisker on the right. Toes downgoing.     Modified Rankin  4   ASSESSMENT: 76 year old  Caucasian lady with hypertensive left basal ganglia hemorrhage in April 2017 who has significant residual expressive aphasia and spastic dense right hemiplegia     PLAN: I had a long d/w patient and her husband Dr London Pepper about her remote hemorrhagic stroke and post stroke spasticity risk for recurrent stroke/TIAs, personally independently reviewed imaging studies and stroke evaluation results and answered questions.Continue  to maintain strict control of hypertension with blood pressure goal below 130/90, diabetes with hemoglobin A1c goal below 6.5% and lipids with LDL cholesterol goal below 70 mg/dL. check  EEG for seizure activity. Continue gabapentin 100 mg 4 times daily for post stroke spasticity and spasms. Increase baclofen to 20 mg 4 times daily gradually as tolerated. Follow-up with Dr. Tessa Lerner for Botox for spasticity.  Followup in the future with me in one year or call earlier if necessary. Greater than 50% of time during this 25 minute visit was spent on counseling,explanation of diagnosis, planning of further management, discussion with patient and family and coordination of care Antony Contras, MD Medical Director [place] Pager: [phone] 05/03/2016 1:03 PM  Note: This document was prepared with digital dictation and possible smart phrase  technology. Any transcriptional errors that result from this process are unintentional

## 2016-05-04 DIAGNOSIS — G8111 Spastic hemiplegia affecting right dominant side: Secondary | ICD-10-CM | POA: Diagnosis not present

## 2016-05-04 DIAGNOSIS — Z780 Asymptomatic menopausal state: Secondary | ICD-10-CM | POA: Diagnosis not present

## 2016-05-04 DIAGNOSIS — I69359 Hemiplegia and hemiparesis following cerebral infarction affecting unspecified side: Secondary | ICD-10-CM | POA: Diagnosis not present

## 2016-05-04 DIAGNOSIS — G8110 Spastic hemiplegia affecting unspecified side: Secondary | ICD-10-CM | POA: Diagnosis not present

## 2016-05-04 DIAGNOSIS — Z6820 Body mass index (BMI) 20.0-20.9, adult: Secondary | ICD-10-CM | POA: Diagnosis not present

## 2016-05-04 DIAGNOSIS — R601 Generalized edema: Secondary | ICD-10-CM | POA: Diagnosis not present

## 2016-05-04 DIAGNOSIS — I350 Nonrheumatic aortic (valve) stenosis: Secondary | ICD-10-CM | POA: Diagnosis not present

## 2016-05-04 DIAGNOSIS — E784 Other hyperlipidemia: Secondary | ICD-10-CM | POA: Diagnosis not present

## 2016-05-04 DIAGNOSIS — E559 Vitamin D deficiency, unspecified: Secondary | ICD-10-CM | POA: Diagnosis not present

## 2016-05-04 DIAGNOSIS — R278 Other lack of coordination: Secondary | ICD-10-CM | POA: Diagnosis not present

## 2016-05-04 DIAGNOSIS — R946 Abnormal results of thyroid function studies: Secondary | ICD-10-CM | POA: Diagnosis not present

## 2016-05-04 DIAGNOSIS — I69120 Aphasia following nontraumatic intracerebral hemorrhage: Secondary | ICD-10-CM | POA: Diagnosis not present

## 2016-05-04 DIAGNOSIS — E785 Hyperlipidemia, unspecified: Secondary | ICD-10-CM | POA: Diagnosis not present

## 2016-05-04 DIAGNOSIS — I638 Other cerebral infarction: Secondary | ICD-10-CM | POA: Diagnosis not present

## 2016-05-04 DIAGNOSIS — J45998 Other asthma: Secondary | ICD-10-CM | POA: Diagnosis not present

## 2016-05-04 DIAGNOSIS — N179 Acute kidney failure, unspecified: Secondary | ICD-10-CM | POA: Diagnosis not present

## 2016-05-04 DIAGNOSIS — I10 Essential (primary) hypertension: Secondary | ICD-10-CM | POA: Diagnosis not present

## 2016-05-04 DIAGNOSIS — M6389 Disorders of muscle in diseases classified elsewhere, multiple sites: Secondary | ICD-10-CM | POA: Diagnosis not present

## 2016-05-04 DIAGNOSIS — I69151 Hemiplegia and hemiparesis following nontraumatic intracerebral hemorrhage affecting right dominant side: Secondary | ICD-10-CM | POA: Diagnosis not present

## 2016-05-07 DIAGNOSIS — I69151 Hemiplegia and hemiparesis following nontraumatic intracerebral hemorrhage affecting right dominant side: Secondary | ICD-10-CM | POA: Diagnosis not present

## 2016-05-07 DIAGNOSIS — R601 Generalized edema: Secondary | ICD-10-CM | POA: Diagnosis not present

## 2016-05-07 DIAGNOSIS — I69120 Aphasia following nontraumatic intracerebral hemorrhage: Secondary | ICD-10-CM | POA: Diagnosis not present

## 2016-05-07 DIAGNOSIS — G8111 Spastic hemiplegia affecting right dominant side: Secondary | ICD-10-CM | POA: Diagnosis not present

## 2016-05-07 DIAGNOSIS — R278 Other lack of coordination: Secondary | ICD-10-CM | POA: Diagnosis not present

## 2016-05-07 DIAGNOSIS — M6389 Disorders of muscle in diseases classified elsewhere, multiple sites: Secondary | ICD-10-CM | POA: Diagnosis not present

## 2016-05-10 ENCOUNTER — Encounter: Payer: Medicare Other | Attending: Physical Medicine & Rehabilitation | Admitting: Physical Medicine & Rehabilitation

## 2016-05-10 ENCOUNTER — Encounter: Payer: Self-pay | Admitting: Physical Medicine & Rehabilitation

## 2016-05-10 VITALS — BP 159/92 | HR 67 | Resp 14

## 2016-05-10 DIAGNOSIS — R252 Cramp and spasm: Secondary | ICD-10-CM | POA: Diagnosis not present

## 2016-05-10 DIAGNOSIS — G8110 Spastic hemiplegia affecting unspecified side: Secondary | ICD-10-CM | POA: Diagnosis not present

## 2016-05-10 DIAGNOSIS — R5383 Other fatigue: Secondary | ICD-10-CM | POA: Insufficient documentation

## 2016-05-10 DIAGNOSIS — I10 Essential (primary) hypertension: Secondary | ICD-10-CM | POA: Insufficient documentation

## 2016-05-10 DIAGNOSIS — F329 Major depressive disorder, single episode, unspecified: Secondary | ICD-10-CM | POA: Diagnosis not present

## 2016-05-10 DIAGNOSIS — R200 Anesthesia of skin: Secondary | ICD-10-CM | POA: Diagnosis not present

## 2016-05-10 DIAGNOSIS — Z5189 Encounter for other specified aftercare: Secondary | ICD-10-CM | POA: Insufficient documentation

## 2016-05-10 DIAGNOSIS — K589 Irritable bowel syndrome without diarrhea: Secondary | ICD-10-CM | POA: Insufficient documentation

## 2016-05-10 DIAGNOSIS — I69191 Dysphagia following nontraumatic intracerebral hemorrhage: Secondary | ICD-10-CM | POA: Insufficient documentation

## 2016-05-10 DIAGNOSIS — I69251 Hemiplegia and hemiparesis following other nontraumatic intracranial hemorrhage affecting right dominant side: Secondary | ICD-10-CM | POA: Insufficient documentation

## 2016-05-10 DIAGNOSIS — R531 Weakness: Secondary | ICD-10-CM | POA: Diagnosis not present

## 2016-05-10 DIAGNOSIS — E785 Hyperlipidemia, unspecified: Secondary | ICD-10-CM | POA: Diagnosis not present

## 2016-05-10 NOTE — Patient Instructions (Signed)
PLEASE FEEL FREE TO CALL OUR OFFICE WITH ANY PROBLEMS OR QUESTIONS [id number])     WORK ON YOUR RANGE OF MOTION DAILY!!

## 2016-05-10 NOTE — Progress Notes (Signed)
Botox Injection for spasticity using needle EMG guidance Indication: Spastic hemiplegia, dominant side (HCC)   Dilution: 100 Units/ml        Total Units Injected: 400 Right upper extremity  Indication: Severe spasticity which interferes with ADL,mobility and/or  hygiene and is unresponsive to medication management and other conservative care Informed consent was obtained after describing risks and benefits of the procedure with the patient. This includes bleeding, bruising, infection, excessive weakness, or medication side effects. A REMS form is on file and signed.  Needle: 61mm injectable monopolar needle electrode  Number of units per muscle Pectoralis Major 0 units Pectoralis Minor 0 units Biceps 75 units Brachioradialis 25 units FCR 25 units FCU 25 units FDS 75 units FDP 75 units FPL 0 units Pronator Teres 75 units Pronator Quadratus 25 units  All injections were done after obtaining appropriate EMG activity and after negative drawback for blood. The patient tolerated the procedure well. Post procedure instructions were given. Return in about 2 months (around 07/08/2016).  She will continue to titrate the baclofen up as tolerated---can target 20mg  QID. Will continue gabapentin 100mg  QID for now. Reviewed the fact that stretching and exercise of her right side needs to be a daily occurrence to achieve maximal benefit from her meds and from botox.

## 2016-05-11 DIAGNOSIS — I350 Nonrheumatic aortic (valve) stenosis: Secondary | ICD-10-CM | POA: Diagnosis not present

## 2016-05-11 DIAGNOSIS — R601 Generalized edema: Secondary | ICD-10-CM | POA: Diagnosis not present

## 2016-05-11 DIAGNOSIS — G8110 Spastic hemiplegia affecting unspecified side: Secondary | ICD-10-CM | POA: Diagnosis not present

## 2016-05-11 DIAGNOSIS — Z0000 Encounter for general adult medical examination without abnormal findings: Secondary | ICD-10-CM | POA: Diagnosis not present

## 2016-05-11 DIAGNOSIS — Z6820 Body mass index (BMI) 20.0-20.9, adult: Secondary | ICD-10-CM | POA: Diagnosis not present

## 2016-05-11 DIAGNOSIS — I69359 Hemiplegia and hemiparesis following cerebral infarction affecting unspecified side: Secondary | ICD-10-CM | POA: Diagnosis not present

## 2016-05-11 DIAGNOSIS — J45909 Unspecified asthma, uncomplicated: Secondary | ICD-10-CM | POA: Diagnosis not present

## 2016-05-11 DIAGNOSIS — R809 Proteinuria, unspecified: Secondary | ICD-10-CM | POA: Diagnosis not present

## 2016-05-11 DIAGNOSIS — I638 Other cerebral infarction: Secondary | ICD-10-CM | POA: Diagnosis not present

## 2016-05-11 DIAGNOSIS — I69120 Aphasia following nontraumatic intracerebral hemorrhage: Secondary | ICD-10-CM | POA: Diagnosis not present

## 2016-05-11 DIAGNOSIS — R278 Other lack of coordination: Secondary | ICD-10-CM | POA: Diagnosis not present

## 2016-05-11 DIAGNOSIS — I69151 Hemiplegia and hemiparesis following nontraumatic intracerebral hemorrhage affecting right dominant side: Secondary | ICD-10-CM | POA: Diagnosis not present

## 2016-05-11 DIAGNOSIS — R946 Abnormal results of thyroid function studies: Secondary | ICD-10-CM | POA: Diagnosis not present

## 2016-05-11 DIAGNOSIS — M6389 Disorders of muscle in diseases classified elsewhere, multiple sites: Secondary | ICD-10-CM | POA: Diagnosis not present

## 2016-05-11 DIAGNOSIS — R918 Other nonspecific abnormal finding of lung field: Secondary | ICD-10-CM | POA: Diagnosis not present

## 2016-05-11 DIAGNOSIS — G8111 Spastic hemiplegia affecting right dominant side: Secondary | ICD-10-CM | POA: Diagnosis not present

## 2016-05-11 DIAGNOSIS — Z1389 Encounter for screening for other disorder: Secondary | ICD-10-CM | POA: Diagnosis not present

## 2016-05-11 DIAGNOSIS — E559 Vitamin D deficiency, unspecified: Secondary | ICD-10-CM | POA: Diagnosis not present

## 2016-05-12 DIAGNOSIS — G8111 Spastic hemiplegia affecting right dominant side: Secondary | ICD-10-CM | POA: Diagnosis not present

## 2016-05-12 DIAGNOSIS — M6389 Disorders of muscle in diseases classified elsewhere, multiple sites: Secondary | ICD-10-CM | POA: Diagnosis not present

## 2016-05-12 DIAGNOSIS — R601 Generalized edema: Secondary | ICD-10-CM | POA: Diagnosis not present

## 2016-05-12 DIAGNOSIS — R278 Other lack of coordination: Secondary | ICD-10-CM | POA: Diagnosis not present

## 2016-05-12 DIAGNOSIS — I69151 Hemiplegia and hemiparesis following nontraumatic intracerebral hemorrhage affecting right dominant side: Secondary | ICD-10-CM | POA: Diagnosis not present

## 2016-05-12 DIAGNOSIS — I69120 Aphasia following nontraumatic intracerebral hemorrhage: Secondary | ICD-10-CM | POA: Diagnosis not present

## 2016-05-13 DIAGNOSIS — I69151 Hemiplegia and hemiparesis following nontraumatic intracerebral hemorrhage affecting right dominant side: Secondary | ICD-10-CM | POA: Diagnosis not present

## 2016-05-13 DIAGNOSIS — M6389 Disorders of muscle in diseases classified elsewhere, multiple sites: Secondary | ICD-10-CM | POA: Diagnosis not present

## 2016-05-13 DIAGNOSIS — R601 Generalized edema: Secondary | ICD-10-CM | POA: Diagnosis not present

## 2016-05-13 DIAGNOSIS — G8111 Spastic hemiplegia affecting right dominant side: Secondary | ICD-10-CM | POA: Diagnosis not present

## 2016-05-13 DIAGNOSIS — R278 Other lack of coordination: Secondary | ICD-10-CM | POA: Diagnosis not present

## 2016-05-13 DIAGNOSIS — I69120 Aphasia following nontraumatic intracerebral hemorrhage: Secondary | ICD-10-CM | POA: Diagnosis not present

## 2016-05-14 DIAGNOSIS — R601 Generalized edema: Secondary | ICD-10-CM | POA: Diagnosis not present

## 2016-05-14 DIAGNOSIS — I69151 Hemiplegia and hemiparesis following nontraumatic intracerebral hemorrhage affecting right dominant side: Secondary | ICD-10-CM | POA: Diagnosis not present

## 2016-05-14 DIAGNOSIS — G8111 Spastic hemiplegia affecting right dominant side: Secondary | ICD-10-CM | POA: Diagnosis not present

## 2016-05-14 DIAGNOSIS — M6389 Disorders of muscle in diseases classified elsewhere, multiple sites: Secondary | ICD-10-CM | POA: Diagnosis not present

## 2016-05-14 DIAGNOSIS — R278 Other lack of coordination: Secondary | ICD-10-CM | POA: Diagnosis not present

## 2016-05-14 DIAGNOSIS — I69120 Aphasia following nontraumatic intracerebral hemorrhage: Secondary | ICD-10-CM | POA: Diagnosis not present

## 2016-05-17 DIAGNOSIS — G8111 Spastic hemiplegia affecting right dominant side: Secondary | ICD-10-CM | POA: Diagnosis not present

## 2016-05-17 DIAGNOSIS — I69120 Aphasia following nontraumatic intracerebral hemorrhage: Secondary | ICD-10-CM | POA: Diagnosis not present

## 2016-05-17 DIAGNOSIS — I69151 Hemiplegia and hemiparesis following nontraumatic intracerebral hemorrhage affecting right dominant side: Secondary | ICD-10-CM | POA: Diagnosis not present

## 2016-05-17 DIAGNOSIS — R601 Generalized edema: Secondary | ICD-10-CM | POA: Diagnosis not present

## 2016-05-17 DIAGNOSIS — R278 Other lack of coordination: Secondary | ICD-10-CM | POA: Diagnosis not present

## 2016-05-17 DIAGNOSIS — M6389 Disorders of muscle in diseases classified elsewhere, multiple sites: Secondary | ICD-10-CM | POA: Diagnosis not present

## 2016-05-18 DIAGNOSIS — R278 Other lack of coordination: Secondary | ICD-10-CM | POA: Diagnosis not present

## 2016-05-18 DIAGNOSIS — M6389 Disorders of muscle in diseases classified elsewhere, multiple sites: Secondary | ICD-10-CM | POA: Diagnosis not present

## 2016-05-18 DIAGNOSIS — I69120 Aphasia following nontraumatic intracerebral hemorrhage: Secondary | ICD-10-CM | POA: Diagnosis not present

## 2016-05-18 DIAGNOSIS — R601 Generalized edema: Secondary | ICD-10-CM | POA: Diagnosis not present

## 2016-05-18 DIAGNOSIS — I69151 Hemiplegia and hemiparesis following nontraumatic intracerebral hemorrhage affecting right dominant side: Secondary | ICD-10-CM | POA: Diagnosis not present

## 2016-05-18 DIAGNOSIS — G8111 Spastic hemiplegia affecting right dominant side: Secondary | ICD-10-CM | POA: Diagnosis not present

## 2016-05-19 DIAGNOSIS — R278 Other lack of coordination: Secondary | ICD-10-CM | POA: Diagnosis not present

## 2016-05-19 DIAGNOSIS — R601 Generalized edema: Secondary | ICD-10-CM | POA: Diagnosis not present

## 2016-05-19 DIAGNOSIS — I69120 Aphasia following nontraumatic intracerebral hemorrhage: Secondary | ICD-10-CM | POA: Diagnosis not present

## 2016-05-19 DIAGNOSIS — I69151 Hemiplegia and hemiparesis following nontraumatic intracerebral hemorrhage affecting right dominant side: Secondary | ICD-10-CM | POA: Diagnosis not present

## 2016-05-19 DIAGNOSIS — G8111 Spastic hemiplegia affecting right dominant side: Secondary | ICD-10-CM | POA: Diagnosis not present

## 2016-05-19 DIAGNOSIS — M6389 Disorders of muscle in diseases classified elsewhere, multiple sites: Secondary | ICD-10-CM | POA: Diagnosis not present

## 2016-05-20 DIAGNOSIS — G8111 Spastic hemiplegia affecting right dominant side: Secondary | ICD-10-CM | POA: Diagnosis not present

## 2016-05-20 DIAGNOSIS — M6389 Disorders of muscle in diseases classified elsewhere, multiple sites: Secondary | ICD-10-CM | POA: Diagnosis not present

## 2016-05-20 DIAGNOSIS — I69151 Hemiplegia and hemiparesis following nontraumatic intracerebral hemorrhage affecting right dominant side: Secondary | ICD-10-CM | POA: Diagnosis not present

## 2016-05-20 DIAGNOSIS — R278 Other lack of coordination: Secondary | ICD-10-CM | POA: Diagnosis not present

## 2016-05-20 DIAGNOSIS — I613 Nontraumatic intracerebral hemorrhage in brain stem: Secondary | ICD-10-CM | POA: Diagnosis not present

## 2016-05-20 DIAGNOSIS — R601 Generalized edema: Secondary | ICD-10-CM | POA: Diagnosis not present

## 2016-05-20 DIAGNOSIS — I69120 Aphasia following nontraumatic intracerebral hemorrhage: Secondary | ICD-10-CM | POA: Diagnosis not present

## 2016-05-21 DIAGNOSIS — I69151 Hemiplegia and hemiparesis following nontraumatic intracerebral hemorrhage affecting right dominant side: Secondary | ICD-10-CM | POA: Diagnosis not present

## 2016-05-21 DIAGNOSIS — G8111 Spastic hemiplegia affecting right dominant side: Secondary | ICD-10-CM | POA: Diagnosis not present

## 2016-05-21 DIAGNOSIS — M6389 Disorders of muscle in diseases classified elsewhere, multiple sites: Secondary | ICD-10-CM | POA: Diagnosis not present

## 2016-05-21 DIAGNOSIS — I69120 Aphasia following nontraumatic intracerebral hemorrhage: Secondary | ICD-10-CM | POA: Diagnosis not present

## 2016-05-21 DIAGNOSIS — R601 Generalized edema: Secondary | ICD-10-CM | POA: Diagnosis not present

## 2016-05-21 DIAGNOSIS — R278 Other lack of coordination: Secondary | ICD-10-CM | POA: Diagnosis not present

## 2016-05-26 DIAGNOSIS — I69151 Hemiplegia and hemiparesis following nontraumatic intracerebral hemorrhage affecting right dominant side: Secondary | ICD-10-CM | POA: Diagnosis not present

## 2016-05-26 DIAGNOSIS — G8111 Spastic hemiplegia affecting right dominant side: Secondary | ICD-10-CM | POA: Diagnosis not present

## 2016-05-26 DIAGNOSIS — I69120 Aphasia following nontraumatic intracerebral hemorrhage: Secondary | ICD-10-CM | POA: Diagnosis not present

## 2016-05-26 DIAGNOSIS — R601 Generalized edema: Secondary | ICD-10-CM | POA: Diagnosis not present

## 2016-05-26 DIAGNOSIS — M6389 Disorders of muscle in diseases classified elsewhere, multiple sites: Secondary | ICD-10-CM | POA: Diagnosis not present

## 2016-05-26 DIAGNOSIS — R278 Other lack of coordination: Secondary | ICD-10-CM | POA: Diagnosis not present

## 2016-05-27 DIAGNOSIS — I69120 Aphasia following nontraumatic intracerebral hemorrhage: Secondary | ICD-10-CM | POA: Diagnosis not present

## 2016-05-27 DIAGNOSIS — G8111 Spastic hemiplegia affecting right dominant side: Secondary | ICD-10-CM | POA: Diagnosis not present

## 2016-05-27 DIAGNOSIS — R601 Generalized edema: Secondary | ICD-10-CM | POA: Diagnosis not present

## 2016-05-27 DIAGNOSIS — M6389 Disorders of muscle in diseases classified elsewhere, multiple sites: Secondary | ICD-10-CM | POA: Diagnosis not present

## 2016-05-27 DIAGNOSIS — R278 Other lack of coordination: Secondary | ICD-10-CM | POA: Diagnosis not present

## 2016-05-27 DIAGNOSIS — I69151 Hemiplegia and hemiparesis following nontraumatic intracerebral hemorrhage affecting right dominant side: Secondary | ICD-10-CM | POA: Diagnosis not present

## 2016-05-28 DIAGNOSIS — R601 Generalized edema: Secondary | ICD-10-CM | POA: Diagnosis not present

## 2016-05-28 DIAGNOSIS — R278 Other lack of coordination: Secondary | ICD-10-CM | POA: Diagnosis not present

## 2016-05-28 DIAGNOSIS — G8111 Spastic hemiplegia affecting right dominant side: Secondary | ICD-10-CM | POA: Diagnosis not present

## 2016-05-28 DIAGNOSIS — I69120 Aphasia following nontraumatic intracerebral hemorrhage: Secondary | ICD-10-CM | POA: Diagnosis not present

## 2016-05-28 DIAGNOSIS — M6389 Disorders of muscle in diseases classified elsewhere, multiple sites: Secondary | ICD-10-CM | POA: Diagnosis not present

## 2016-05-28 DIAGNOSIS — I69151 Hemiplegia and hemiparesis following nontraumatic intracerebral hemorrhage affecting right dominant side: Secondary | ICD-10-CM | POA: Diagnosis not present

## 2016-05-31 ENCOUNTER — Ambulatory Visit (INDEPENDENT_AMBULATORY_CARE_PROVIDER_SITE_OTHER): Payer: Medicare Other | Admitting: Neurology

## 2016-05-31 DIAGNOSIS — R569 Unspecified convulsions: Secondary | ICD-10-CM

## 2016-05-31 DIAGNOSIS — G8110 Spastic hemiplegia affecting unspecified side: Secondary | ICD-10-CM

## 2016-06-01 DIAGNOSIS — I69151 Hemiplegia and hemiparesis following nontraumatic intracerebral hemorrhage affecting right dominant side: Secondary | ICD-10-CM | POA: Diagnosis not present

## 2016-06-01 DIAGNOSIS — G8111 Spastic hemiplegia affecting right dominant side: Secondary | ICD-10-CM | POA: Diagnosis not present

## 2016-06-01 DIAGNOSIS — R601 Generalized edema: Secondary | ICD-10-CM | POA: Diagnosis not present

## 2016-06-01 DIAGNOSIS — R278 Other lack of coordination: Secondary | ICD-10-CM | POA: Diagnosis not present

## 2016-06-01 DIAGNOSIS — M6389 Disorders of muscle in diseases classified elsewhere, multiple sites: Secondary | ICD-10-CM | POA: Diagnosis not present

## 2016-06-01 DIAGNOSIS — I69120 Aphasia following nontraumatic intracerebral hemorrhage: Secondary | ICD-10-CM | POA: Diagnosis not present

## 2016-06-02 DIAGNOSIS — M6389 Disorders of muscle in diseases classified elsewhere, multiple sites: Secondary | ICD-10-CM | POA: Diagnosis not present

## 2016-06-02 DIAGNOSIS — R278 Other lack of coordination: Secondary | ICD-10-CM | POA: Diagnosis not present

## 2016-06-02 DIAGNOSIS — G8111 Spastic hemiplegia affecting right dominant side: Secondary | ICD-10-CM | POA: Diagnosis not present

## 2016-06-02 DIAGNOSIS — I69151 Hemiplegia and hemiparesis following nontraumatic intracerebral hemorrhage affecting right dominant side: Secondary | ICD-10-CM | POA: Diagnosis not present

## 2016-06-02 DIAGNOSIS — I69120 Aphasia following nontraumatic intracerebral hemorrhage: Secondary | ICD-10-CM | POA: Diagnosis not present

## 2016-06-02 DIAGNOSIS — R601 Generalized edema: Secondary | ICD-10-CM | POA: Diagnosis not present

## 2016-06-03 DIAGNOSIS — G8111 Spastic hemiplegia affecting right dominant side: Secondary | ICD-10-CM | POA: Diagnosis not present

## 2016-06-03 DIAGNOSIS — R278 Other lack of coordination: Secondary | ICD-10-CM | POA: Diagnosis not present

## 2016-06-03 DIAGNOSIS — R601 Generalized edema: Secondary | ICD-10-CM | POA: Diagnosis not present

## 2016-06-03 DIAGNOSIS — M6389 Disorders of muscle in diseases classified elsewhere, multiple sites: Secondary | ICD-10-CM | POA: Diagnosis not present

## 2016-06-03 DIAGNOSIS — I69120 Aphasia following nontraumatic intracerebral hemorrhage: Secondary | ICD-10-CM | POA: Diagnosis not present

## 2016-06-03 DIAGNOSIS — I69151 Hemiplegia and hemiparesis following nontraumatic intracerebral hemorrhage affecting right dominant side: Secondary | ICD-10-CM | POA: Diagnosis not present

## 2016-06-08 ENCOUNTER — Telehealth: Payer: Self-pay

## 2016-06-08 DIAGNOSIS — M6389 Disorders of muscle in diseases classified elsewhere, multiple sites: Secondary | ICD-10-CM | POA: Diagnosis not present

## 2016-06-08 DIAGNOSIS — R601 Generalized edema: Secondary | ICD-10-CM | POA: Diagnosis not present

## 2016-06-08 DIAGNOSIS — I69151 Hemiplegia and hemiparesis following nontraumatic intracerebral hemorrhage affecting right dominant side: Secondary | ICD-10-CM | POA: Diagnosis not present

## 2016-06-08 DIAGNOSIS — I69120 Aphasia following nontraumatic intracerebral hemorrhage: Secondary | ICD-10-CM | POA: Diagnosis not present

## 2016-06-08 DIAGNOSIS — R278 Other lack of coordination: Secondary | ICD-10-CM | POA: Diagnosis not present

## 2016-06-08 DIAGNOSIS — G8111 Spastic hemiplegia affecting right dominant side: Secondary | ICD-10-CM | POA: Diagnosis not present

## 2016-06-08 NOTE — Telephone Encounter (Signed)
Rn spoke with patients husband about EEG results. Rn stated  patient that EEG study was abnormal with mild slowing of brain waves and irritability on left side which may suggest seizure risk  but no definite seizures noted. Pts husband verbalized understanding. Pts husband wanted his wife taper off the gabapentin. RN stated Dr. Alger Simons prescribed the gabapentin. Pts husband will call his office.

## 2016-06-08 NOTE — Telephone Encounter (Signed)
-----   Message from Garvin Fila, MD sent at 06/05/2016 10:12 PM EST ----- Kindly inform patient that EEG study was abnormal with mild slowing of brain waves and irritability on left side which may suggest seizure risk  but no definite seizures noted

## 2016-06-10 DIAGNOSIS — M6389 Disorders of muscle in diseases classified elsewhere, multiple sites: Secondary | ICD-10-CM | POA: Diagnosis not present

## 2016-06-10 DIAGNOSIS — R278 Other lack of coordination: Secondary | ICD-10-CM | POA: Diagnosis not present

## 2016-06-10 DIAGNOSIS — G8111 Spastic hemiplegia affecting right dominant side: Secondary | ICD-10-CM | POA: Diagnosis not present

## 2016-06-10 DIAGNOSIS — I69151 Hemiplegia and hemiparesis following nontraumatic intracerebral hemorrhage affecting right dominant side: Secondary | ICD-10-CM | POA: Diagnosis not present

## 2016-06-10 DIAGNOSIS — I69120 Aphasia following nontraumatic intracerebral hemorrhage: Secondary | ICD-10-CM | POA: Diagnosis not present

## 2016-06-10 DIAGNOSIS — R601 Generalized edema: Secondary | ICD-10-CM | POA: Diagnosis not present

## 2016-06-14 DIAGNOSIS — R601 Generalized edema: Secondary | ICD-10-CM | POA: Diagnosis not present

## 2016-06-14 DIAGNOSIS — I69120 Aphasia following nontraumatic intracerebral hemorrhage: Secondary | ICD-10-CM | POA: Diagnosis not present

## 2016-06-14 DIAGNOSIS — R278 Other lack of coordination: Secondary | ICD-10-CM | POA: Diagnosis not present

## 2016-06-14 DIAGNOSIS — I69151 Hemiplegia and hemiparesis following nontraumatic intracerebral hemorrhage affecting right dominant side: Secondary | ICD-10-CM | POA: Diagnosis not present

## 2016-06-14 DIAGNOSIS — G8111 Spastic hemiplegia affecting right dominant side: Secondary | ICD-10-CM | POA: Diagnosis not present

## 2016-06-14 DIAGNOSIS — M6389 Disorders of muscle in diseases classified elsewhere, multiple sites: Secondary | ICD-10-CM | POA: Diagnosis not present

## 2016-06-16 ENCOUNTER — Encounter (HOSPITAL_COMMUNITY): Payer: Self-pay | Admitting: *Deleted

## 2016-06-16 ENCOUNTER — Emergency Department (HOSPITAL_COMMUNITY): Payer: Medicare Other

## 2016-06-16 ENCOUNTER — Emergency Department (HOSPITAL_COMMUNITY)
Admission: EM | Admit: 2016-06-16 | Discharge: 2016-06-17 | Disposition: A | Discharge status: Home / Self Care | Payer: Medicare Other | Attending: Emergency Medicine | Admitting: Emergency Medicine

## 2016-06-16 DIAGNOSIS — R278 Other lack of coordination: Secondary | ICD-10-CM | POA: Diagnosis not present

## 2016-06-16 DIAGNOSIS — R601 Generalized edema: Secondary | ICD-10-CM | POA: Diagnosis not present

## 2016-06-16 DIAGNOSIS — W19XXXA Unspecified fall, initial encounter: Secondary | ICD-10-CM

## 2016-06-16 DIAGNOSIS — S0083XA Contusion of other part of head, initial encounter: Secondary | ICD-10-CM | POA: Insufficient documentation

## 2016-06-16 DIAGNOSIS — Z79899 Other long term (current) drug therapy: Secondary | ICD-10-CM | POA: Insufficient documentation

## 2016-06-16 DIAGNOSIS — Z8673 Personal history of transient ischemic attack (TIA), and cerebral infarction without residual deficits: Secondary | ICD-10-CM | POA: Diagnosis not present

## 2016-06-16 DIAGNOSIS — I10 Essential (primary) hypertension: Secondary | ICD-10-CM | POA: Diagnosis not present

## 2016-06-16 DIAGNOSIS — S0990XA Unspecified injury of head, initial encounter: Secondary | ICD-10-CM | POA: Diagnosis not present

## 2016-06-16 DIAGNOSIS — S199XXA Unspecified injury of neck, initial encounter: Secondary | ICD-10-CM | POA: Diagnosis not present

## 2016-06-16 DIAGNOSIS — W06XXXA Fall from bed, initial encounter: Secondary | ICD-10-CM | POA: Diagnosis not present

## 2016-06-16 DIAGNOSIS — Y939 Activity, unspecified: Secondary | ICD-10-CM | POA: Insufficient documentation

## 2016-06-16 DIAGNOSIS — I69151 Hemiplegia and hemiparesis following nontraumatic intracerebral hemorrhage affecting right dominant side: Secondary | ICD-10-CM | POA: Diagnosis not present

## 2016-06-16 DIAGNOSIS — M6389 Disorders of muscle in diseases classified elsewhere, multiple sites: Secondary | ICD-10-CM | POA: Diagnosis not present

## 2016-06-16 DIAGNOSIS — Y999 Unspecified external cause status: Secondary | ICD-10-CM | POA: Insufficient documentation

## 2016-06-16 DIAGNOSIS — Y929 Unspecified place or not applicable: Secondary | ICD-10-CM | POA: Insufficient documentation

## 2016-06-16 DIAGNOSIS — I69120 Aphasia following nontraumatic intracerebral hemorrhage: Secondary | ICD-10-CM | POA: Diagnosis not present

## 2016-06-16 DIAGNOSIS — J329 Chronic sinusitis, unspecified: Secondary | ICD-10-CM | POA: Diagnosis not present

## 2016-06-16 DIAGNOSIS — G8111 Spastic hemiplegia affecting right dominant side: Secondary | ICD-10-CM | POA: Diagnosis not present

## 2016-06-16 DIAGNOSIS — R6884 Jaw pain: Secondary | ICD-10-CM | POA: Diagnosis not present

## 2016-06-16 NOTE — ED Triage Notes (Signed)
Pt has hx of hemorrhagic stroke, has R sided paralysis and aphasis, and is wheelchair bound. Husband was assisting pt from bed at 1730, pt attempted to pivot from bed to get to wheelchair and fell, hitting her head on a nightstand. Pt has swelling and bruising to R jaw. No new neuro deficits on assessment and per husband.

## 2016-06-17 ENCOUNTER — Emergency Department (HOSPITAL_COMMUNITY): Payer: Medicare Other

## 2016-06-17 DIAGNOSIS — J329 Chronic sinusitis, unspecified: Secondary | ICD-10-CM | POA: Diagnosis not present

## 2016-06-17 DIAGNOSIS — R6884 Jaw pain: Secondary | ICD-10-CM | POA: Diagnosis not present

## 2016-06-17 DIAGNOSIS — S0083XA Contusion of other part of head, initial encounter: Secondary | ICD-10-CM | POA: Diagnosis not present

## 2016-06-17 MED ORDER — DOXYCYCLINE HYCLATE 100 MG PO CAPS: 100.0000 mg | ORAL_CAPSULE | Freq: Two times a day (BID) | ORAL | 0 refills | Status: DC

## 2016-06-17 MED ORDER — ACETAMINOPHEN 325 MG PO TABS
650.0000 mg | ORAL_TABLET | Freq: Once | ORAL | Status: AC
  Administered 2016-06-17: 650 mg via ORAL
  Filled 2016-06-17: qty 2

## 2016-06-17 NOTE — ED Notes (Signed)
Patient transported to X-ray 

## 2016-06-17 NOTE — Discharge Instructions (Signed)
Your testing is negative for fracture or other serious injury. Follow-up with your doctor. Return to the ED if you develop worsening headache, vomiting, behavior change or any other concerns.

## 2016-06-17 NOTE — ED Provider Notes (Signed)
[facility] DEPT Provider Note   CSN: [id number] Arrival date & time: 06/16/16  2151 By signing my name below, I, Dyke Brackett, attest that this documentation has been prepared under the direction and in the presence of Ezequiel Essex, MD . Electronically Signed: Dyke Brackett, Scribe. 06/17/2016. 1:26 AM  History   Chief Complaint Chief Complaint  Patient presents with  . Fall   HPI [name] is a 76 y.o. female, with a history of hemorrhagic stroke, dysphasia and hemiplegia, who presents to the Emergency Department complaining of sudden onset swelling  to right upper jaw s/p fall tonight at 5:30. Pt was transporting from her bed to her wheelchair when she slipped and fell, hitting her right lateral head on the bedside table and falling to the ground. She also reports some right sided upper chest pain. No medication taken PTA. No alleviating or modifying factors noted. [name] husband states there was a drop of dried blood in pt's ear s/p fall. No changes from baseline per husband. She denies any LOC, new weakness or numbness, nausea, vomiting, vision changes, abdominal pain, or hip pain. Pt has no other complaints or symptoms at this time.  Tetanus UTD.  The history is provided by the patient and the spouse. No language interpreter was used.   Past Medical History:  Diagnosis Date  . Acute renal insufficiency    due to amphotericin 2012  . Allergic rhinitis, cause unspecified   . Bronchitis, not specified as acute or chronic   . Diverticulosis   . 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 Hypertension   . Irritable bowel   . Other chronic allergic conjunctivitis   . Other chronic sinusitis   . Stroke [facility])    Patient Active Problem List   Diagnosis Date Noted  . Expressive aphasia 11/03/2015  . Labile blood pressure   . Somnolence   . 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 Hypertensive emergency 08/11/2015  . Incontinence 08/11/2015  .  Hypokalemia 08/11/2015  . Protein-calorie malnutrition ([place]) 08/11/2015  . Spastic hemiplegia, dominant side ([place])   . Aphasia, post-stroke   . Dysphagia, post-stroke   . Dysphasia, post-stroke   . 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 Cognitive deficit, post-stroke   . Seizure prophylaxis   . Leukocytosis   . Benign essential HTN   . HLD (hyperlipidemia)   . Lethargy   . Altered mental status   . Cytotoxic brain edema ([place]) 08/05/2015  . Brain herniation ([place]) 08/05/2015  . ICH (intracerebral hemorrhage) ([place]) 08/01/2015  . Hepatitis 08/12/2010  . Renal failure 08/12/2010  . 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 CONJUNCTIVITIS, ALLERGIC 09/02/2008  . 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 ALLERGIC RHINITIS 09/02/2008  . BRONCHITIS 09/02/2008   Past Surgical History:  Procedure Laterality Date  . APPENDECTOMY    . BREAST LUMPECTOMY     Right-benign  . BRONCHOSCOPY     Video bronch w/ TBBX 2012  . CESAREAN SECTION     x 4  . TONSILLECTOMY    . VESICOVAGINAL FISTULA CLOSURE W/ TAH      OB History    No data available      Home Medications    Prior to Admission medications   Medication Sig Start Date End Date Taking? Authorizing Provider  acetaminophen (TYLENOL) 325 MG tablet Take 650 mg by mouth every 6 (six) hours as needed.    Historical Provider, MD  atorvastatin (LIPITOR) 20 MG tablet Take 20 mg by mouth  at bedtime.  05/07/15   Historical Provider, MD  baclofen (LIORESAL) 20 MG tablet Take 1 tablet (20 mg total) by mouth 3 (three) times daily. Patient taking differently: Take 20 mg by mouth 3 (three) times daily.  03/29/16   Meredith Staggers, MD  Cholecalciferol (VITAMIN D) 2000 units tablet Take by mouth. 03/02/11   Historical Provider, MD  cycloSPORINE (RESTASIS) 0.05 % ophthalmic emulsion Place 1 drop into both eyes.    Historical Provider, MD  gabapentin (NEURONTIN) 100 MG capsule Take 1 capsule (100 mg total) by mouth 4 (four) times daily. 03/29/16   Meredith Staggers, MD  latanoprost (XALATAN) 0.005 % ophthalmic solution 1 drop at bedtime.    Historical Provider, MD  loratadine (CLARITIN) 10 MG tablet Take 10 mg by mouth daily.    Historical Provider, MD  losartan (COZAAR) 100 MG tablet Take 100 mg by mouth at bedtime.  05/07/15   Historical Provider, MD  Melatonin 1 MG TABS Take by mouth.    Historical Provider, MD  mirtazapine (REMERON) 15 MG tablet Take 1 tablet (15 mg total) by mouth at bedtime. 09/11/15   Lavon Paganini Angiulli, PA-C  Olopatadine HCl (PATADAY) 0.2 % SOLN Place 1 drop into both eyes every 12 (twelve) hours as needed (seasonal allergies).    Historical Provider, MD  polyethylene glycol powder (MIRALAX) powder Take 1 Container by mouth once.    Historical Provider, MD  Probiotic Product (ALIGN) 4 MG CAPS Take 4 mg by mouth daily.     Historical Provider, MD  sennosides-docusate sodium (SENOKOT-S) 8.6-50 MG tablet Take 1 tablet by mouth daily.    Historical Provider, MD   Family History Family History  Problem Relation Age of Onset  . Aortic aneurysm Father   . Heart failure Mother     Social History Social History  Substance Use Topics  . Smoking status: Never Smoker  . Smokeless tobacco: Never Used  . Alcohol use 0.6 oz/week    1 Glasses of wine per week     Comment: occasionally    Allergies   Penicillins  Review of Systems Review of Systems 10 systems reviewed and all are negative for acute change except as noted in the HPI.  Physical Exam Updated Vital Signs BP 152/80   Pulse 74   Temp 98.5 F (36.9 C) (Oral)   Resp 16   SpO2 95%   Physical Exam  Constitutional: She is oriented to person, place, and time. She appears well-developed and well-nourished. No distress.  HENT:  Head: Normocephalic. Head is without Battle's sign.  Right Ear: No hemotympanum.  Left Ear: No hemotympanum.  Mouth/Throat: Oropharynx is clear and moist. No oropharyngeal exudate.  On right ear, there is a small superficial laceration  between tragus and inferior pina. There is swelling of her tragus that partially occludes ear canal. TM appears normal. No malocclusion or trismus. No septal hematoma.   Eyes: Conjunctivae and EOM are normal. Pupils are equal, round, and reactive to light.  Neck: Normal range of motion. Neck supple.  No meningismus.  Cardiovascular: Normal rate, regular rhythm, normal heart sounds and intact distal pulses.   No murmur heard. Pulmonary/Chest: Effort normal and breath sounds normal. No respiratory distress.  Abdominal: Soft. There is no tenderness. There is no rebound and no guarding.  Musculoskeletal: Normal range of motion. She exhibits no edema or tenderness.  No C-spine tenderness  Neurological: She is alert and oriented to person, place, and time. No cranial nerve deficit. She  exhibits normal muscle tone. Coordination normal.  Right sided hemiparesis at baseline, right sided facial droop at baseline, 5/5 strength of left arm and leg.  Skin: Skin is warm.  Psychiatric: She has a normal mood and affect. Her behavior is normal.  Nursing note and vitals reviewed.  ED Treatments / Results  DIAGNOSTIC STUDIES:  Oxygen Saturation is 100% on RA, normal by my interpretation.    COORDINATION OF CARE:  12:18 AM Discussed treatment plan with pt at bedside and pt agreed to plan.   Labs (all labs ordered are listed, but only abnormal results are displayed) Labs Reviewed - No data to display  EKG  EKG Interpretation None      Radiology Dg Chest 2 View  Result Date: 06/17/2016 CLINICAL DATA:  Right jaw pain. EXAM: CHEST  2 VIEW COMPARISON:  08/21/2015 FINDINGS: The cardiomediastinal contours are normal. Atherosclerosis of the aortic arch. Pulmonary vasculature is normal. No consolidation, pleural effusion, or pneumothorax. No acute osseous abnormalities are seen. IMPRESSION: No acute abnormality. Electronically Signed   By: Jeb Levering M.D.   On: 06/17/2016 01:22   Ct Head Wo  Contrast  Result Date: 06/16/2016 CLINICAL DATA:  Status post fall from bed, hitting head on night stand. Concern for head or cervical spine injury. Initial encounter. EXAM: CT HEAD WITHOUT CONTRAST CT CERVICAL SPINE WITHOUT CONTRAST TECHNIQUE: Multidetector CT imaging of the head and cervical spine was performed following the standard protocol without intravenous contrast. Multiplanar CT image reconstructions of the cervical spine were also generated. COMPARISON:  CT of the head performed 08/14/2015, and MRI/MRA of the brain performed 08/02/2015 FINDINGS: CT HEAD FINDINGS Brain: No evidence of acute infarction, hemorrhage, hydrocephalus, extra-axial collection or mass lesion/mass effect. Prominence of the ventricles and sulci reflects mild cortical volume loss. Cerebellar atrophy is noted. Scattered periventricular and subcortical white matter change likely reflects small vessel ischemic microangiopathy. Chronic encephalomalacia is noted at the left basal ganglia, extending minimally into the left cerebral peduncle. The brainstem and fourth ventricle are within normal limits. The cerebral hemispheres demonstrate grossly normal gray-white differentiation. No mass effect or midline shift is seen. Vascular: No hyperdense vessel or unexpected calcification. Skull: There is no evidence of fracture; visualized osseous structures are unremarkable in appearance. Sinuses/Orbits: The orbits are within normal limits. There is opacification of the left maxillary sinus and ethmoid air cells. The remaining paranasal sinuses and mastoid air cells are well-aerated. Other: No significant soft tissue abnormalities are seen. CT CERVICAL SPINE FINDINGS Alignment: There is mild grade 1 anterolisthesis of C5 on C6, and mild grade 1 retrolisthesis of C6 on C7. Skull base and vertebrae: No acute fracture. No primary bone lesion or focal pathologic process. Soft tissues and spinal canal: No prevertebral fluid or swelling. No visible  canal hematoma. Disc levels: Mild disc space narrowing is noted along the lower cervical spine. Degenerative change is seen about the dens. There is chronic osseous fusion at the posterior aspects of vertebral bodies C3 and C4. Small anterior and posterior disc osteophyte complexes are noted along the lower cervical spine, with underlying facet disease. Upper chest: Scarring is noted at the lung apices. The thyroid gland is unremarkable in appearance. Calcification is seen at the carotid bifurcations bilaterally. Other: No additional soft tissue abnormalities are seen. IMPRESSION: 1. No evidence of traumatic intracranial injury or fracture. 2. No evidence of acute fracture or subluxation along the cervical spine. 3. Mild cortical volume loss and scattered small vessel ischemic microangiopathy. 4. Chronic encephalomalacia at the left  basal ganglia, extending minimally into the left cerebral peduncle. 5. Mild degenerative change along the cervical spine. Chronic osseous fusion at the posterior aspects of vertebral bodies C3 and C4. 6. Scarring at the lung apices. 7. Opacification of the left maxillary sinus and ethmoid air cells. 8. Calcification at the carotid bifurcations bilaterally. Carotid ultrasound would be helpful for further evaluation, when and as deemed clinically appropriate. Electronically Signed   By: Garald Balding M.D.   On: 06/16/2016 23:13   Ct Cervical Spine Wo Contrast  Result Date: 06/16/2016 CLINICAL DATA:  Status post fall from bed, hitting head on night stand. Concern for head or cervical spine injury. Initial encounter. EXAM: CT HEAD WITHOUT CONTRAST CT CERVICAL SPINE WITHOUT CONTRAST TECHNIQUE: Multidetector CT imaging of the head and cervical spine was performed following the standard protocol without intravenous contrast. Multiplanar CT image reconstructions of the cervical spine were also generated. COMPARISON:  CT of the head performed 08/14/2015, and MRI/MRA of the brain performed  08/02/2015 FINDINGS: CT HEAD FINDINGS Brain: No evidence of acute infarction, hemorrhage, hydrocephalus, extra-axial collection or mass lesion/mass effect. Prominence of the ventricles and sulci reflects mild cortical volume loss. Cerebellar atrophy is noted. Scattered periventricular and subcortical white matter change likely reflects small vessel ischemic microangiopathy. Chronic encephalomalacia is noted at the left basal ganglia, extending minimally into the left cerebral peduncle. The brainstem and fourth ventricle are within normal limits. The cerebral hemispheres demonstrate grossly normal gray-white differentiation. No mass effect or midline shift is seen. Vascular: No hyperdense vessel or unexpected calcification. Skull: There is no evidence of fracture; visualized osseous structures are unremarkable in appearance. Sinuses/Orbits: The orbits are within normal limits. There is opacification of the left maxillary sinus and ethmoid air cells. The remaining paranasal sinuses and mastoid air cells are well-aerated. Other: No significant soft tissue abnormalities are seen. CT CERVICAL SPINE FINDINGS Alignment: There is mild grade 1 anterolisthesis of C5 on C6, and mild grade 1 retrolisthesis of C6 on C7. Skull base and vertebrae: No acute fracture. No primary bone lesion or focal pathologic process. Soft tissues and spinal canal: No prevertebral fluid or swelling. No visible canal hematoma. Disc levels: Mild disc space narrowing is noted along the lower cervical spine. Degenerative change is seen about the dens. There is chronic osseous fusion at the posterior aspects of vertebral bodies C3 and C4. Small anterior and posterior disc osteophyte complexes are noted along the lower cervical spine, with underlying facet disease. Upper chest: Scarring is noted at the lung apices. The thyroid gland is unremarkable in appearance. Calcification is seen at the carotid bifurcations bilaterally. Other: No additional soft  tissue abnormalities are seen. IMPRESSION: 1. No evidence of traumatic intracranial injury or fracture. 2. No evidence of acute fracture or subluxation along the cervical spine. 3. Mild cortical volume loss and scattered small vessel ischemic microangiopathy. 4. Chronic encephalomalacia at the left basal ganglia, extending minimally into the left cerebral peduncle. 5. Mild degenerative change along the cervical spine. Chronic osseous fusion at the posterior aspects of vertebral bodies C3 and C4. 6. Scarring at the lung apices. 7. Opacification of the left maxillary sinus and ethmoid air cells. 8. Calcification at the carotid bifurcations bilaterally. Carotid ultrasound would be helpful for further evaluation, when and as deemed clinically appropriate. Electronically Signed   By: Garald Balding M.D.   On: 06/16/2016 23:13   Ct Maxillofacial Wo Contrast  Result Date: 06/17/2016 CLINICAL DATA:  RIGHT temporomandibular joint pain. History of chronic sinusitis and stroke. EXAM:  CT MAXILLOFACIAL WITHOUT CONTRAST TECHNIQUE: Multidetector CT imaging of the maxillofacial structures was performed. Multiplanar CT image reconstructions were also generated. A small metallic BB was placed on the right temple in order to reliably differentiate right from left. COMPARISON:  CT HEAD June 16, 2016 FINDINGS: OSSEOUS: The mandible is intact, the condyles are located. No acute facial fracture. No destructive bony lesions. Symmetric normal appearance of bilateral temporomandibular joints. No CT evidence of dental pathology. ORBITS: Ocular globes and orbital contents are normal. SINUSES: Severe soft tissue effacement LEFT maxillary sinus with air-fluid levels, mucoperiosteal reaction. Frothy secretions frontal sinus with moderate ethmoid, sphenoid and mild RIGHT maxillary sinus mucosal thickening. Middle ears and mastoid air cells are well aerated. Nasal septum deviated to the LEFT, RIGHT concha lamella. Soft tissue opacifying the  ostiomeatal units, sphenoethmoidal and frontal recesses. SOFT TISSUES: No significant soft tissue swelling. No subcutaneous gas or radiopaque foreign bodies. Severe calcific atherosclerosis of the carotid bifurcations which can result in stenosis. IMPRESSION: Moderate to severe acute on chronic paranasal sinusitis. Negative CT of the temporomandibular joints. Electronically Signed   By: Elon Alas M.D.   On: 06/17/2016 00:56    Procedures Procedures (including critical care time)  Medications Ordered in ED Medications  acetaminophen (TYLENOL) tablet 650 mg (not administered)     Initial Impression / Assessment and Plan / ED Course  I have reviewed the triage vital signs and the nursing notes.  Pertinent labs & imaging results that were available during my care of the patient were reviewed by me and considered in my medical decision making (see chart for details).     Patient with right hemiplegia from previous stroke presenting after a fall today [place] to the transfer. Hit her head on a nightstand. No loss of consciousness. No blood thinner use. No new neuro deficits. No vomiting.  CT scan obtained in triage shows no fracture of skull nor intracranial hemorrhage. There is no facial fracture. There is no malocclusion on exam. Patient has tenderness and swelling to her right tragus but no malocclusion. Negative hemotympanum. Negative battle sign.  CT shows no facial or jaw fracture but does show severe chronic sinusitis. Patient's husband who is a physician states this is a chronic issue and she is asymptomatic from this.  Workup negative for serious acute traumatic injury. Patient at her neuro baseline.  Appears stable for outpatient follow up. Return precautions discussed.  Final Clinical Impressions(s) / ED Diagnoses   Final diagnoses:  Fall, initial encounter  Contusion of face, initial encounter    New Prescriptions New Prescriptions   No medications on file  I  personally performed the services described in this documentation, which was scribed in my presence. The recorded information has been reviewed and is accurate.    Ezequiel Essex, MD 06/17/16 [phone]

## 2016-06-17 NOTE — ED Notes (Signed)
Patient transported to CT 

## 2016-06-17 NOTE — ED Notes (Signed)
ED Provider at bedside. 

## 2016-06-21 DIAGNOSIS — G8111 Spastic hemiplegia affecting right dominant side: Secondary | ICD-10-CM | POA: Diagnosis not present

## 2016-06-21 DIAGNOSIS — R278 Other lack of coordination: Secondary | ICD-10-CM | POA: Diagnosis not present

## 2016-06-21 DIAGNOSIS — I613 Nontraumatic intracerebral hemorrhage in brain stem: Secondary | ICD-10-CM | POA: Diagnosis not present

## 2016-06-21 DIAGNOSIS — R601 Generalized edema: Secondary | ICD-10-CM | POA: Diagnosis not present

## 2016-06-21 DIAGNOSIS — M6389 Disorders of muscle in diseases classified elsewhere, multiple sites: Secondary | ICD-10-CM | POA: Diagnosis not present

## 2016-06-21 DIAGNOSIS — I69151 Hemiplegia and hemiparesis following nontraumatic intracerebral hemorrhage affecting right dominant side: Secondary | ICD-10-CM | POA: Diagnosis not present

## 2016-06-23 DIAGNOSIS — M6389 Disorders of muscle in diseases classified elsewhere, multiple sites: Secondary | ICD-10-CM | POA: Diagnosis not present

## 2016-06-23 DIAGNOSIS — I613 Nontraumatic intracerebral hemorrhage in brain stem: Secondary | ICD-10-CM | POA: Diagnosis not present

## 2016-06-23 DIAGNOSIS — R601 Generalized edema: Secondary | ICD-10-CM | POA: Diagnosis not present

## 2016-06-23 DIAGNOSIS — R278 Other lack of coordination: Secondary | ICD-10-CM | POA: Diagnosis not present

## 2016-06-23 DIAGNOSIS — G8111 Spastic hemiplegia affecting right dominant side: Secondary | ICD-10-CM | POA: Diagnosis not present

## 2016-06-23 DIAGNOSIS — I69151 Hemiplegia and hemiparesis following nontraumatic intracerebral hemorrhage affecting right dominant side: Secondary | ICD-10-CM | POA: Diagnosis not present

## 2016-06-28 DIAGNOSIS — I613 Nontraumatic intracerebral hemorrhage in brain stem: Secondary | ICD-10-CM | POA: Diagnosis not present

## 2016-06-28 DIAGNOSIS — M6389 Disorders of muscle in diseases classified elsewhere, multiple sites: Secondary | ICD-10-CM | POA: Diagnosis not present

## 2016-06-28 DIAGNOSIS — I69151 Hemiplegia and hemiparesis following nontraumatic intracerebral hemorrhage affecting right dominant side: Secondary | ICD-10-CM | POA: Diagnosis not present

## 2016-06-28 DIAGNOSIS — R601 Generalized edema: Secondary | ICD-10-CM | POA: Diagnosis not present

## 2016-06-28 DIAGNOSIS — G8111 Spastic hemiplegia affecting right dominant side: Secondary | ICD-10-CM | POA: Diagnosis not present

## 2016-06-28 DIAGNOSIS — R278 Other lack of coordination: Secondary | ICD-10-CM | POA: Diagnosis not present

## 2016-06-30 DIAGNOSIS — R601 Generalized edema: Secondary | ICD-10-CM | POA: Diagnosis not present

## 2016-06-30 DIAGNOSIS — M6389 Disorders of muscle in diseases classified elsewhere, multiple sites: Secondary | ICD-10-CM | POA: Diagnosis not present

## 2016-06-30 DIAGNOSIS — G8111 Spastic hemiplegia affecting right dominant side: Secondary | ICD-10-CM | POA: Diagnosis not present

## 2016-06-30 DIAGNOSIS — I613 Nontraumatic intracerebral hemorrhage in brain stem: Secondary | ICD-10-CM | POA: Diagnosis not present

## 2016-06-30 DIAGNOSIS — R278 Other lack of coordination: Secondary | ICD-10-CM | POA: Diagnosis not present

## 2016-06-30 DIAGNOSIS — I69151 Hemiplegia and hemiparesis following nontraumatic intracerebral hemorrhage affecting right dominant side: Secondary | ICD-10-CM | POA: Diagnosis not present

## 2016-07-06 DIAGNOSIS — I613 Nontraumatic intracerebral hemorrhage in brain stem: Secondary | ICD-10-CM | POA: Diagnosis not present

## 2016-07-06 DIAGNOSIS — R601 Generalized edema: Secondary | ICD-10-CM | POA: Diagnosis not present

## 2016-07-06 DIAGNOSIS — I69151 Hemiplegia and hemiparesis following nontraumatic intracerebral hemorrhage affecting right dominant side: Secondary | ICD-10-CM | POA: Diagnosis not present

## 2016-07-06 DIAGNOSIS — R278 Other lack of coordination: Secondary | ICD-10-CM | POA: Diagnosis not present

## 2016-07-06 DIAGNOSIS — M6389 Disorders of muscle in diseases classified elsewhere, multiple sites: Secondary | ICD-10-CM | POA: Diagnosis not present

## 2016-07-06 DIAGNOSIS — G8111 Spastic hemiplegia affecting right dominant side: Secondary | ICD-10-CM | POA: Diagnosis not present

## 2016-07-07 ENCOUNTER — Encounter: Payer: Self-pay | Admitting: Physical Medicine & Rehabilitation

## 2016-07-07 ENCOUNTER — Encounter: Payer: Medicare Other | Attending: Physical Medicine & Rehabilitation | Admitting: Physical Medicine & Rehabilitation

## 2016-07-07 VITALS — BP 169/83 | HR 65

## 2016-07-07 DIAGNOSIS — I69251 Hemiplegia and hemiparesis following other nontraumatic intracranial hemorrhage affecting right dominant side: Secondary | ICD-10-CM | POA: Diagnosis not present

## 2016-07-07 DIAGNOSIS — I610 Nontraumatic intracerebral hemorrhage in hemisphere, subcortical: Secondary | ICD-10-CM

## 2016-07-07 DIAGNOSIS — F329 Major depressive disorder, single episode, unspecified: Secondary | ICD-10-CM | POA: Diagnosis not present

## 2016-07-07 DIAGNOSIS — R200 Anesthesia of skin: Secondary | ICD-10-CM | POA: Diagnosis not present

## 2016-07-07 DIAGNOSIS — R252 Cramp and spasm: Secondary | ICD-10-CM | POA: Insufficient documentation

## 2016-07-07 DIAGNOSIS — R5383 Other fatigue: Secondary | ICD-10-CM | POA: Insufficient documentation

## 2016-07-07 DIAGNOSIS — E785 Hyperlipidemia, unspecified: Secondary | ICD-10-CM | POA: Insufficient documentation

## 2016-07-07 DIAGNOSIS — Z5189 Encounter for other specified aftercare: Secondary | ICD-10-CM | POA: Insufficient documentation

## 2016-07-07 DIAGNOSIS — I10 Essential (primary) hypertension: Secondary | ICD-10-CM | POA: Insufficient documentation

## 2016-07-07 DIAGNOSIS — R531 Weakness: Secondary | ICD-10-CM | POA: Insufficient documentation

## 2016-07-07 DIAGNOSIS — K589 Irritable bowel syndrome without diarrhea: Secondary | ICD-10-CM | POA: Insufficient documentation

## 2016-07-07 DIAGNOSIS — I69191 Dysphagia following nontraumatic intracerebral hemorrhage: Secondary | ICD-10-CM | POA: Diagnosis not present

## 2016-07-07 DIAGNOSIS — G8110 Spastic hemiplegia affecting unspecified side: Secondary | ICD-10-CM | POA: Diagnosis not present

## 2016-07-07 NOTE — Progress Notes (Signed)
Subjective:    Patient ID: [name], female    DOB: [birth year], 76 y.o.   MRN: [id number]  HPI   [name] is here in follow up of her right hemiparesis and aphasia. She had a fall on 06/17/16 in her bedroom. Striking her head on a bedside table. She fortunately did not suffer any serious injury. I reviewed all of her imaging which was unremarkable for paranasal sinusitis. She has complained of decreased hearing which his improving but not back to baseline  Her botox injections were helpful in January although over the last 2 weeks they have noticed that spasticity is recurring.   Husband had questions about a posterior leaf splint to help with knee and ankle control as had been discussed by therapy.  We attempted to titrate baclofen to 20mg  TID but she can't tolerate more than 50mg  daily due to sedation. He was able to wean her off the gabapentin. He has reduced her remeron to every other night.   Pain Inventory Average Pain 1 Pain Right Now 0 My pain is intermittent  In the last 24 hours, has pain interfered with the following? General activity 0 Relation with others 0 Enjoyment of life 1 What TIME of day is your pain at its worst? evening Sleep (in general) Fair  Pain is worse with: unsure Pain improves with: rest, heat/ice, therapy/exercise and medication Relief from Meds: 9  Mobility walk with assistance use a walker ability to climb steps?  no do you drive?  no use a wheelchair needs help with transfers  Function retired I need assistance with the following:  dressing, bathing, meal prep, household duties and shopping  Neuro/Psych bowel control problems weakness numbness trouble walking spasms confusion anxiety loss of taste or smell  Prior Studies Any changes since last visit?  no  Physicians involved in your care Any changes since last visit?  no   Family History  Problem Relation Age of Onset  . Aortic aneurysm Father   . Heart failure Mother      Social History   Social History  . Marital status: Married    Spouse name: Dr. Genelle Bal  . Number of children: N/A  . Years of education: N/A   Social History Main Topics  . Smoking status: Never Smoker  . Smokeless tobacco: Never Used  . Alcohol use 0.6 oz/week    1 Glasses of wine per week     Comment: occasionally  . Drug use: No  . Sexual activity: Not on file   Other Topics Concern  . Not on file   Social History Narrative  . No narrative on file   Past Surgical History:  Procedure Laterality Date  . APPENDECTOMY    . BREAST LUMPECTOMY     Right-benign  . BRONCHOSCOPY     Video bronch w/ TBBX 2012  . CESAREAN SECTION     x 4  . TONSILLECTOMY    . VESICOVAGINAL FISTULA CLOSURE W/ TAH     Past Medical History:  Diagnosis Date  . Acute renal insufficiency    due to amphotericin 2012  . Allergic rhinitis, cause unspecified   . Bronchitis, not specified as acute or chronic   . Diverticulosis   . 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 Hypertension   . Irritable bowel   . Other chronic allergic conjunctivitis   . Other chronic sinusitis   . Stroke (HCC)    BP (!) 169/83   Pulse 65  SpO2 98%   Opioid Risk Score:   Fall Risk Score:  `1  Depression screen PHQ 2/9  Depression screen PHQ 2/9 10/06/2015  Decreased Interest 0  Down, Depressed, Hopeless 1  PHQ - 2 Score 1  Altered sleeping 3  Tired, decreased energy 0  Change in appetite 0  Feeling bad or failure about yourself  1  Trouble concentrating 2  Moving slowly or fidgety/restless 3  Suicidal thoughts 0  PHQ-9 Score 10  Difficult doing work/chores Very difficult    Review of Systems  Constitutional: Negative.   HENT: Negative.   Eyes: Negative.   Respiratory: Negative.   Cardiovascular: Negative.   Gastrointestinal: Positive for constipation.  Endocrine: Negative.   Genitourinary: Negative.   Musculoskeletal: Positive for joint swelling.  Allergic/Immunologic:  Negative.   Neurological: Negative.   Hematological: Negative.   Psychiatric/Behavioral: Negative.   All other systems reviewed and are negative.      Objective:   Physical Exam  Constitutional: She appears well-developed, well nourished. NAD. HENT: Normocephalic. Atraumatic. no visible bruising. Hearing appears functional Cardiovascular: Normal rate and regular rhythm. + systolic Murmur Respiratory: Effort normal and breath sounds normal. No respiratory distress. no wheezes or rales GI: PEG site with hypergranulation Musc: 1/2" sublux anterior right shoulder Neurological: speaks in low volume with word finding deficits at times.  Alert.persistent verbal apraxia. DTRs 3+ right upper and right lower extremity  Motor:  Right upper extremity and right lower extremity:1/5 HAD, 1+ KE , trace ADF/PF. Has resting heel cord tightness RLE Left upper and left lower extremity 4/5. Flexor tone RUE I spersistent---tr/4 biceps, PT, 2/4 wrist and finger flexors/thumb, 2/4 at pecs/ TeMj/TeMi. trace extensor tone RLE at quads.   Marland Kitchen 2-3 beats clonus RLE.  Skin: Skin is warm and dry.  Psych: pleasant and up beat.    Assessment & Plan:  Medical Problem List and Plan: 1. 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 Central neuropathic pain: - 4. Spasticity: 300 units botox Right finger and wrist flexors, pecs -baclofen will keep balcofen at the 01-26-09-20 schedule as this appears to be her max dose -right knee brace, AFO, dyna splint -resting WFO and daytime splint to continue.  -off gabapentin without any changes in tone or neuropathic pain  -will arrange for dysport 1000 units in one month to right pecs as well as finger and wrist flexors  -stretches were provided for  right heel cord 5. Neuropsych: mood has been up beat. -off celexa -remeron weaning to off. She could stop completely from the dose she's currently taking but it's ok to wean slower if they are more comfortable with that 6. Hypertension. Cozaar 100 mg daily. Some elevation again  today.              -monitor regularly at home. No changes in regeimn 7. Recent fall. Hearing showing some improvement---if persistent hearing loss, we may consider ENT eval.   Fifteen minutes of face to face patient care time were spent during this visit. All questions were encouraged and answered.  Follow up in a month for dysport injections. Greater than 50% of time during this encounter was spent counseling patient/family in regard to stretching, orthotics, spasticity.

## 2016-07-13 DIAGNOSIS — I69151 Hemiplegia and hemiparesis following nontraumatic intracerebral hemorrhage affecting right dominant side: Secondary | ICD-10-CM | POA: Diagnosis not present

## 2016-07-13 DIAGNOSIS — G8111 Spastic hemiplegia affecting right dominant side: Secondary | ICD-10-CM | POA: Diagnosis not present

## 2016-07-13 DIAGNOSIS — M6389 Disorders of muscle in diseases classified elsewhere, multiple sites: Secondary | ICD-10-CM | POA: Diagnosis not present

## 2016-07-13 DIAGNOSIS — I613 Nontraumatic intracerebral hemorrhage in brain stem: Secondary | ICD-10-CM | POA: Diagnosis not present

## 2016-07-13 DIAGNOSIS — R601 Generalized edema: Secondary | ICD-10-CM | POA: Diagnosis not present

## 2016-07-13 DIAGNOSIS — R278 Other lack of coordination: Secondary | ICD-10-CM | POA: Diagnosis not present

## 2016-07-14 DIAGNOSIS — R278 Other lack of coordination: Secondary | ICD-10-CM | POA: Diagnosis not present

## 2016-07-14 DIAGNOSIS — R601 Generalized edema: Secondary | ICD-10-CM | POA: Diagnosis not present

## 2016-07-14 DIAGNOSIS — M6389 Disorders of muscle in diseases classified elsewhere, multiple sites: Secondary | ICD-10-CM | POA: Diagnosis not present

## 2016-07-14 DIAGNOSIS — G8111 Spastic hemiplegia affecting right dominant side: Secondary | ICD-10-CM | POA: Diagnosis not present

## 2016-07-14 DIAGNOSIS — I69151 Hemiplegia and hemiparesis following nontraumatic intracerebral hemorrhage affecting right dominant side: Secondary | ICD-10-CM | POA: Diagnosis not present

## 2016-07-14 DIAGNOSIS — I613 Nontraumatic intracerebral hemorrhage in brain stem: Secondary | ICD-10-CM | POA: Diagnosis not present

## 2016-07-19 DIAGNOSIS — G8111 Spastic hemiplegia affecting right dominant side: Secondary | ICD-10-CM | POA: Diagnosis not present

## 2016-07-19 DIAGNOSIS — I69151 Hemiplegia and hemiparesis following nontraumatic intracerebral hemorrhage affecting right dominant side: Secondary | ICD-10-CM | POA: Diagnosis not present

## 2016-07-19 DIAGNOSIS — R278 Other lack of coordination: Secondary | ICD-10-CM | POA: Diagnosis not present

## 2016-07-19 DIAGNOSIS — R601 Generalized edema: Secondary | ICD-10-CM | POA: Diagnosis not present

## 2016-07-19 DIAGNOSIS — M6389 Disorders of muscle in diseases classified elsewhere, multiple sites: Secondary | ICD-10-CM | POA: Diagnosis not present

## 2016-07-19 DIAGNOSIS — I613 Nontraumatic intracerebral hemorrhage in brain stem: Secondary | ICD-10-CM | POA: Diagnosis not present

## 2016-07-21 DIAGNOSIS — I69151 Hemiplegia and hemiparesis following nontraumatic intracerebral hemorrhage affecting right dominant side: Secondary | ICD-10-CM | POA: Diagnosis not present

## 2016-07-21 DIAGNOSIS — M6389 Disorders of muscle in diseases classified elsewhere, multiple sites: Secondary | ICD-10-CM | POA: Diagnosis not present

## 2016-07-21 DIAGNOSIS — R601 Generalized edema: Secondary | ICD-10-CM | POA: Diagnosis not present

## 2016-07-21 DIAGNOSIS — R278 Other lack of coordination: Secondary | ICD-10-CM | POA: Diagnosis not present

## 2016-07-21 DIAGNOSIS — G8111 Spastic hemiplegia affecting right dominant side: Secondary | ICD-10-CM | POA: Diagnosis not present

## 2016-07-21 DIAGNOSIS — I613 Nontraumatic intracerebral hemorrhage in brain stem: Secondary | ICD-10-CM | POA: Diagnosis not present

## 2016-07-27 DIAGNOSIS — I69151 Hemiplegia and hemiparesis following nontraumatic intracerebral hemorrhage affecting right dominant side: Secondary | ICD-10-CM | POA: Diagnosis not present

## 2016-07-27 DIAGNOSIS — R278 Other lack of coordination: Secondary | ICD-10-CM | POA: Diagnosis not present

## 2016-07-27 DIAGNOSIS — M6389 Disorders of muscle in diseases classified elsewhere, multiple sites: Secondary | ICD-10-CM | POA: Diagnosis not present

## 2016-07-27 DIAGNOSIS — G8111 Spastic hemiplegia affecting right dominant side: Secondary | ICD-10-CM | POA: Diagnosis not present

## 2016-07-27 DIAGNOSIS — I613 Nontraumatic intracerebral hemorrhage in brain stem: Secondary | ICD-10-CM | POA: Diagnosis not present

## 2016-07-27 DIAGNOSIS — R601 Generalized edema: Secondary | ICD-10-CM | POA: Diagnosis not present

## 2016-07-28 DIAGNOSIS — G8111 Spastic hemiplegia affecting right dominant side: Secondary | ICD-10-CM | POA: Diagnosis not present

## 2016-07-28 DIAGNOSIS — I613 Nontraumatic intracerebral hemorrhage in brain stem: Secondary | ICD-10-CM | POA: Diagnosis not present

## 2016-07-28 DIAGNOSIS — M6389 Disorders of muscle in diseases classified elsewhere, multiple sites: Secondary | ICD-10-CM | POA: Diagnosis not present

## 2016-07-28 DIAGNOSIS — R278 Other lack of coordination: Secondary | ICD-10-CM | POA: Diagnosis not present

## 2016-07-28 DIAGNOSIS — R601 Generalized edema: Secondary | ICD-10-CM | POA: Diagnosis not present

## 2016-07-28 DIAGNOSIS — I69151 Hemiplegia and hemiparesis following nontraumatic intracerebral hemorrhage affecting right dominant side: Secondary | ICD-10-CM | POA: Diagnosis not present

## 2016-07-30 DIAGNOSIS — D2272 Melanocytic nevi of left lower limb, including hip: Secondary | ICD-10-CM | POA: Diagnosis not present

## 2016-07-30 DIAGNOSIS — L814 Other melanin hyperpigmentation: Secondary | ICD-10-CM | POA: Diagnosis not present

## 2016-07-30 DIAGNOSIS — L821 Other seborrheic keratosis: Secondary | ICD-10-CM | POA: Diagnosis not present

## 2016-07-30 DIAGNOSIS — D2261 Melanocytic nevi of right upper limb, including shoulder: Secondary | ICD-10-CM | POA: Diagnosis not present

## 2016-07-30 DIAGNOSIS — D225 Melanocytic nevi of trunk: Secondary | ICD-10-CM | POA: Diagnosis not present

## 2016-07-30 DIAGNOSIS — D2271 Melanocytic nevi of right lower limb, including hip: Secondary | ICD-10-CM | POA: Diagnosis not present

## 2016-07-30 DIAGNOSIS — D1801 Hemangioma of skin and subcutaneous tissue: Secondary | ICD-10-CM | POA: Diagnosis not present

## 2016-07-30 DIAGNOSIS — L812 Freckles: Secondary | ICD-10-CM | POA: Diagnosis not present

## 2016-07-30 DIAGNOSIS — D2262 Melanocytic nevi of left upper limb, including shoulder: Secondary | ICD-10-CM | POA: Diagnosis not present

## 2016-08-02 ENCOUNTER — Encounter: Payer: Self-pay | Admitting: Physical Medicine & Rehabilitation

## 2016-08-02 ENCOUNTER — Encounter: Payer: Medicare Other | Attending: Physical Medicine & Rehabilitation | Admitting: Physical Medicine & Rehabilitation

## 2016-08-02 VITALS — BP 136/80 | HR 68

## 2016-08-02 DIAGNOSIS — I10 Essential (primary) hypertension: Secondary | ICD-10-CM | POA: Insufficient documentation

## 2016-08-02 DIAGNOSIS — R252 Cramp and spasm: Secondary | ICD-10-CM | POA: Insufficient documentation

## 2016-08-02 DIAGNOSIS — F329 Major depressive disorder, single episode, unspecified: Secondary | ICD-10-CM | POA: Insufficient documentation

## 2016-08-02 DIAGNOSIS — R531 Weakness: Secondary | ICD-10-CM | POA: Diagnosis not present

## 2016-08-02 DIAGNOSIS — I69191 Dysphagia following nontraumatic intracerebral hemorrhage: Secondary | ICD-10-CM | POA: Diagnosis not present

## 2016-08-02 DIAGNOSIS — E785 Hyperlipidemia, unspecified: Secondary | ICD-10-CM | POA: Insufficient documentation

## 2016-08-02 DIAGNOSIS — G8110 Spastic hemiplegia affecting unspecified side: Secondary | ICD-10-CM | POA: Diagnosis not present

## 2016-08-02 DIAGNOSIS — R200 Anesthesia of skin: Secondary | ICD-10-CM | POA: Diagnosis not present

## 2016-08-02 DIAGNOSIS — R5383 Other fatigue: Secondary | ICD-10-CM | POA: Diagnosis not present

## 2016-08-02 DIAGNOSIS — I69251 Hemiplegia and hemiparesis following other nontraumatic intracranial hemorrhage affecting right dominant side: Secondary | ICD-10-CM | POA: Insufficient documentation

## 2016-08-02 DIAGNOSIS — Z5189 Encounter for other specified aftercare: Secondary | ICD-10-CM | POA: Insufficient documentation

## 2016-08-02 DIAGNOSIS — K589 Irritable bowel syndrome without diarrhea: Secondary | ICD-10-CM | POA: Diagnosis not present

## 2016-08-02 DIAGNOSIS — I610 Nontraumatic intracerebral hemorrhage in hemisphere, subcortical: Secondary | ICD-10-CM

## 2016-08-02 NOTE — Patient Instructions (Signed)
PLEASE FEEL FREE TO CALL OUR OFFICE WITH ANY PROBLEMS OR QUESTIONS ([phone])      

## 2016-08-02 NOTE — Progress Notes (Signed)
Botox Injection for spasticity using needle EMG guidance Indication: Spastic hemiplegia, dominant side (HCC)  Left Basal ganglia hemorrhage (HCC)   Dilution: 100 Units/ml        Total Units Injected: 400 Indication: Severe spasticity which interferes with ADL,mobility and/or  hygiene and is unresponsive to medication management and other conservative care Informed consent was obtained after describing risks and benefits of the procedure with the patient. This includes bleeding, bruising, infection, excessive weakness, or medication side effects. A REMS form is on file and signed.  Needle: 52mm injectable monopolar needle electrode  Number of units per muscle Pectoralis Major 50 units Pectoralis Minor 50 units Biceps 50 units Brachioradialis 50 units FCR 0 units FCU 0 units FDS 100 units FDP 100 units FPL 0 units Pronator Teres 0 units Pronator Quadratus 0 units  All injections were done after obtaining appropriate EMG activity and after negative drawback for blood. The patient tolerated the procedure well. Post procedure instructions were given. Return in about 2 months (around 10/02/2016).

## 2016-08-03 DIAGNOSIS — G8111 Spastic hemiplegia affecting right dominant side: Secondary | ICD-10-CM | POA: Diagnosis not present

## 2016-08-03 DIAGNOSIS — M6389 Disorders of muscle in diseases classified elsewhere, multiple sites: Secondary | ICD-10-CM | POA: Diagnosis not present

## 2016-08-03 DIAGNOSIS — R601 Generalized edema: Secondary | ICD-10-CM | POA: Diagnosis not present

## 2016-08-03 DIAGNOSIS — I613 Nontraumatic intracerebral hemorrhage in brain stem: Secondary | ICD-10-CM | POA: Diagnosis not present

## 2016-08-03 DIAGNOSIS — R278 Other lack of coordination: Secondary | ICD-10-CM | POA: Diagnosis not present

## 2016-08-03 DIAGNOSIS — I69151 Hemiplegia and hemiparesis following nontraumatic intracerebral hemorrhage affecting right dominant side: Secondary | ICD-10-CM | POA: Diagnosis not present

## 2016-08-13 DIAGNOSIS — H401213 Low-tension glaucoma, right eye, severe stage: Secondary | ICD-10-CM | POA: Diagnosis not present

## 2016-08-13 DIAGNOSIS — H401221 Low-tension glaucoma, left eye, mild stage: Secondary | ICD-10-CM | POA: Diagnosis not present

## 2016-08-13 DIAGNOSIS — Z961 Presence of intraocular lens: Secondary | ICD-10-CM | POA: Diagnosis not present

## 2016-08-13 DIAGNOSIS — H04123 Dry eye syndrome of bilateral lacrimal glands: Secondary | ICD-10-CM | POA: Diagnosis not present

## 2016-08-17 DIAGNOSIS — R278 Other lack of coordination: Secondary | ICD-10-CM | POA: Diagnosis not present

## 2016-08-17 DIAGNOSIS — G8111 Spastic hemiplegia affecting right dominant side: Secondary | ICD-10-CM | POA: Diagnosis not present

## 2016-08-17 DIAGNOSIS — R601 Generalized edema: Secondary | ICD-10-CM | POA: Diagnosis not present

## 2016-08-17 DIAGNOSIS — I613 Nontraumatic intracerebral hemorrhage in brain stem: Secondary | ICD-10-CM | POA: Diagnosis not present

## 2016-08-17 DIAGNOSIS — M6389 Disorders of muscle in diseases classified elsewhere, multiple sites: Secondary | ICD-10-CM | POA: Diagnosis not present

## 2016-08-17 DIAGNOSIS — I69151 Hemiplegia and hemiparesis following nontraumatic intracerebral hemorrhage affecting right dominant side: Secondary | ICD-10-CM | POA: Diagnosis not present

## 2016-08-19 DIAGNOSIS — I69151 Hemiplegia and hemiparesis following nontraumatic intracerebral hemorrhage affecting right dominant side: Secondary | ICD-10-CM | POA: Diagnosis not present

## 2016-08-19 DIAGNOSIS — R601 Generalized edema: Secondary | ICD-10-CM | POA: Diagnosis not present

## 2016-08-19 DIAGNOSIS — R278 Other lack of coordination: Secondary | ICD-10-CM | POA: Diagnosis not present

## 2016-08-19 DIAGNOSIS — G8111 Spastic hemiplegia affecting right dominant side: Secondary | ICD-10-CM | POA: Diagnosis not present

## 2016-08-19 DIAGNOSIS — M6389 Disorders of muscle in diseases classified elsewhere, multiple sites: Secondary | ICD-10-CM | POA: Diagnosis not present

## 2016-08-19 DIAGNOSIS — I613 Nontraumatic intracerebral hemorrhage in brain stem: Secondary | ICD-10-CM | POA: Diagnosis not present

## 2016-08-24 DIAGNOSIS — I613 Nontraumatic intracerebral hemorrhage in brain stem: Secondary | ICD-10-CM | POA: Diagnosis not present

## 2016-08-24 DIAGNOSIS — M6389 Disorders of muscle in diseases classified elsewhere, multiple sites: Secondary | ICD-10-CM | POA: Diagnosis not present

## 2016-08-24 DIAGNOSIS — R601 Generalized edema: Secondary | ICD-10-CM | POA: Diagnosis not present

## 2016-08-24 DIAGNOSIS — I69151 Hemiplegia and hemiparesis following nontraumatic intracerebral hemorrhage affecting right dominant side: Secondary | ICD-10-CM | POA: Diagnosis not present

## 2016-08-24 DIAGNOSIS — R278 Other lack of coordination: Secondary | ICD-10-CM | POA: Diagnosis not present

## 2016-08-24 DIAGNOSIS — G8111 Spastic hemiplegia affecting right dominant side: Secondary | ICD-10-CM | POA: Diagnosis not present

## 2016-08-26 DIAGNOSIS — G8111 Spastic hemiplegia affecting right dominant side: Secondary | ICD-10-CM | POA: Diagnosis not present

## 2016-08-26 DIAGNOSIS — M6389 Disorders of muscle in diseases classified elsewhere, multiple sites: Secondary | ICD-10-CM | POA: Diagnosis not present

## 2016-08-26 DIAGNOSIS — I613 Nontraumatic intracerebral hemorrhage in brain stem: Secondary | ICD-10-CM | POA: Diagnosis not present

## 2016-08-26 DIAGNOSIS — R601 Generalized edema: Secondary | ICD-10-CM | POA: Diagnosis not present

## 2016-08-26 DIAGNOSIS — R278 Other lack of coordination: Secondary | ICD-10-CM | POA: Diagnosis not present

## 2016-08-26 DIAGNOSIS — I69151 Hemiplegia and hemiparesis following nontraumatic intracerebral hemorrhage affecting right dominant side: Secondary | ICD-10-CM | POA: Diagnosis not present

## 2016-09-02 DIAGNOSIS — I613 Nontraumatic intracerebral hemorrhage in brain stem: Secondary | ICD-10-CM | POA: Diagnosis not present

## 2016-09-02 DIAGNOSIS — I69151 Hemiplegia and hemiparesis following nontraumatic intracerebral hemorrhage affecting right dominant side: Secondary | ICD-10-CM | POA: Diagnosis not present

## 2016-09-02 DIAGNOSIS — G8111 Spastic hemiplegia affecting right dominant side: Secondary | ICD-10-CM | POA: Diagnosis not present

## 2016-09-02 DIAGNOSIS — R278 Other lack of coordination: Secondary | ICD-10-CM | POA: Diagnosis not present

## 2016-09-02 DIAGNOSIS — R601 Generalized edema: Secondary | ICD-10-CM | POA: Diagnosis not present

## 2016-09-02 DIAGNOSIS — M6389 Disorders of muscle in diseases classified elsewhere, multiple sites: Secondary | ICD-10-CM | POA: Diagnosis not present

## 2016-09-21 ENCOUNTER — Other Ambulatory Visit: Payer: Self-pay | Admitting: Physical Medicine & Rehabilitation

## 2016-10-04 ENCOUNTER — Encounter: Payer: Medicare Other | Attending: Physical Medicine & Rehabilitation | Admitting: Physical Medicine & Rehabilitation

## 2016-10-04 ENCOUNTER — Encounter: Payer: Self-pay | Admitting: Physical Medicine & Rehabilitation

## 2016-10-04 VITALS — BP 109/69 | HR 62 | Resp 14

## 2016-10-04 DIAGNOSIS — I610 Nontraumatic intracerebral hemorrhage in hemisphere, subcortical: Secondary | ICD-10-CM

## 2016-10-04 DIAGNOSIS — I69251 Hemiplegia and hemiparesis following other nontraumatic intracranial hemorrhage affecting right dominant side: Secondary | ICD-10-CM | POA: Insufficient documentation

## 2016-10-04 DIAGNOSIS — K589 Irritable bowel syndrome without diarrhea: Secondary | ICD-10-CM | POA: Insufficient documentation

## 2016-10-04 DIAGNOSIS — R252 Cramp and spasm: Secondary | ICD-10-CM | POA: Diagnosis not present

## 2016-10-04 DIAGNOSIS — E785 Hyperlipidemia, unspecified: Secondary | ICD-10-CM | POA: Diagnosis not present

## 2016-10-04 DIAGNOSIS — Z5189 Encounter for other specified aftercare: Secondary | ICD-10-CM | POA: Insufficient documentation

## 2016-10-04 DIAGNOSIS — R200 Anesthesia of skin: Secondary | ICD-10-CM | POA: Diagnosis not present

## 2016-10-04 DIAGNOSIS — I10 Essential (primary) hypertension: Secondary | ICD-10-CM | POA: Diagnosis not present

## 2016-10-04 DIAGNOSIS — R5383 Other fatigue: Secondary | ICD-10-CM | POA: Diagnosis not present

## 2016-10-04 DIAGNOSIS — F329 Major depressive disorder, single episode, unspecified: Secondary | ICD-10-CM | POA: Diagnosis not present

## 2016-10-04 DIAGNOSIS — R531 Weakness: Secondary | ICD-10-CM | POA: Diagnosis not present

## 2016-10-04 DIAGNOSIS — I69391 Dysphagia following cerebral infarction: Secondary | ICD-10-CM

## 2016-10-04 DIAGNOSIS — I69191 Dysphagia following nontraumatic intracerebral hemorrhage: Secondary | ICD-10-CM | POA: Insufficient documentation

## 2016-10-04 DIAGNOSIS — G8110 Spastic hemiplegia affecting unspecified side: Secondary | ICD-10-CM

## 2016-10-04 NOTE — Patient Instructions (Signed)
PLEASE FEEL FREE TO CALL OUR OFFICE WITH ANY PROBLEMS OR QUESTIONS ([phone])      

## 2016-10-04 NOTE — Progress Notes (Signed)
Subjective:    Patient ID: [name], female    DOB: [birth year], 76 y.o.   MRN: [id number]  HPI   [name] is here in follow up of her spastic right hemiparesis. We performed dysport injections at last visit which seemed to have worked well for her. She continues with her wrist splint with "custom" modifications made by husband. They are interested in a new splint for the wrist and hand to better incorporate the wrist and fingers. They have an appt to see an orthotist at [facility] orth[facility].   [name] is off of all mood medications. She can only tolerate 50mg  of baclofen at most despite efforts to titrate further.    Pain Inventory Average Pain 0 Pain Right Now 0 My pain is dull and ache  In the last 24 hours, has pain interfered with the following? General activity 0 Relation with others 0 Enjoyment of life 0 What TIME of day is your pain at its worst? night Sleep (in general) Fair  Pain is worse with: inactivity Pain improves with: heat/ice, therapy/exercise and injections Relief from Meds: no pain  Mobility walk with assistance use a walker ability to climb steps?  no do you drive?  no Do you have any goals in this area?  yes  Function retired I need assistance with the following:  dressing, bathing, meal prep, household duties and shopping  Neuro/Psych weakness numbness trouble walking loss of taste or smell  Prior Studies Any changes since last visit?  no  Physicians involved in your care Any changes since last visit?  no   Family History  Problem Relation Age of Onset  . Aortic aneurysm Father   . Heart failure Mother    Social History   Social History  . Marital status: Married    Spouse name: Dr. Genelle Bal  . Number of children: N/A  . Years of education: N/A   Social History Main Topics  . Smoking status: Never Smoker  . Smokeless tobacco: Never Used  . Alcohol use 0.6 oz/week    1 Glasses of wine per week     Comment: occasionally  .  Drug use: No  . Sexual activity: Not Asked   Other Topics Concern  . None   Social History Narrative  . None   Past Surgical History:  Procedure Laterality Date  . APPENDECTOMY    . BREAST LUMPECTOMY     Right-benign  . BRONCHOSCOPY     Video bronch w/ TBBX 2012  . CESAREAN SECTION     x 4  . TONSILLECTOMY    . VESICOVAGINAL FISTULA CLOSURE W/ TAH     Past Medical History:  Diagnosis Date  . Acute renal insufficiency    due to amphotericin 2012  . Allergic rhinitis, cause unspecified   . Bronchitis, not specified as acute or chronic   . Diverticulosis   . 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 Hypertension   . Irritable bowel   . Other chronic allergic conjunctivitis   . Other chronic sinusitis   . Stroke ([place])    BP 109/69   Pulse 62   Resp 14   SpO2 97%   Opioid Risk Score:   Fall Risk Score:  `1  Depression screen PHQ 2/9  Depression screen PHQ 2/9 10/06/2015  Decreased Interest 0  Down, Depressed, Hopeless 1  PHQ - 2 Score 1  Altered sleeping 3  Tired, decreased energy 0  Change in appetite 0  Feeling bad or failure about yourself  1  Trouble concentrating 2  Moving slowly or fidgety/restless 3  Suicidal thoughts 0  PHQ-9 Score 10  Difficult doing work/chores Very difficult      Review of Systems  Constitutional: Positive for unexpected weight change (Weight loss). Negative for appetite change (Poor appetite).  HENT: Negative.   Eyes: Positive for discharge.  Respiratory: Negative.   Cardiovascular: Negative.   Gastrointestinal: Positive for constipation.  Endocrine: Negative.   Genitourinary: Negative.   Musculoskeletal: Negative.   Skin: Negative.   Neurological: Negative.   Hematological: Negative.   All other systems reviewed and are negative.      Objective:   Physical Exam  Constitutional: She appears well-developed, well nourished. NAD. HENT: Normocephalic. Atraumatic. no visible bruising. Hearing appears  functional Cardiovascular: Normal rate and regular rhythm. + systolic Murmur Respiratory: Effort normal and breath sounds normal. No respiratory distress. no wheezes or rales GI: PEG site with hypergranulation Musc: 1/2"sublux anterior right shoulder Neurological: speaks in low volume with word finding deficits at times.  Alert.persistent verbal apraxia. DTRs 3+ right upper and right lower extremity  Motor:  Right upper extremity and right lower extremity: RUE trace to 0/5. 1/5 HAD, 1+ KE , trace ADF/PF. Has resting heel cord tightness RLE Left upper and left lower extremity 4/5. Flexor tone RUE I spersistent---tr/4 biceps, PT, 1/4 wrist and finger flexors/thumb, tr to 1/4 at pecs/ TeMj/TeMi. trace extensor tone RLE at quads.   Marland Kitchen 1-2 beats clonus RLE.  Skin: Skin is warm and dry.  Psych: pleasant and up beat.    Assessment & Plan:  Medical Problem List and Plan: 1. 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 Central neuropathic pain: - 4. Spasticity:   -baclofen will keep balcofen at the 01-26-09-20 schedule as this appears to be her max dose -right knee brace, AFO, dyna splint -resting WFO custom rx order was written.  -has done quite well with botulinum toxin injections             -will arrange for another round of dysport 1000 units in one month to right pecs as well as finger and wrist flexors             -discssed positional considerations for elbow to avoid ulnar nerve compression 5. Neuropsych: mood has been up beat. Off all meds currently 6. Hypertension. Cozaar 100 mg daily. Some elevation again  today.  -monitor regularly at home. No changes in regeimn 7. Recent fall. Hearing showing some improvement---if persistent  hearing loss, we may consider ENT eval.   Fifteen minutes of face to face patient care time were spent during this visit. All questions were encouraged and answered.  Greater than 50% of time during this encounter was spent counseling patient/family in regard to splinting options and spasticity mgt.

## 2016-11-10 ENCOUNTER — Encounter: Payer: Self-pay | Admitting: Physical Medicine & Rehabilitation

## 2016-11-10 ENCOUNTER — Encounter: Payer: Medicare Other | Attending: Physical Medicine & Rehabilitation | Admitting: Physical Medicine & Rehabilitation

## 2016-11-10 VITALS — BP 151/80 | HR 62

## 2016-11-10 DIAGNOSIS — I69251 Hemiplegia and hemiparesis following other nontraumatic intracranial hemorrhage affecting right dominant side: Secondary | ICD-10-CM | POA: Diagnosis not present

## 2016-11-10 DIAGNOSIS — R531 Weakness: Secondary | ICD-10-CM | POA: Diagnosis not present

## 2016-11-10 DIAGNOSIS — R200 Anesthesia of skin: Secondary | ICD-10-CM | POA: Diagnosis not present

## 2016-11-10 DIAGNOSIS — Z5189 Encounter for other specified aftercare: Secondary | ICD-10-CM | POA: Diagnosis not present

## 2016-11-10 DIAGNOSIS — K589 Irritable bowel syndrome without diarrhea: Secondary | ICD-10-CM | POA: Diagnosis not present

## 2016-11-10 DIAGNOSIS — E785 Hyperlipidemia, unspecified: Secondary | ICD-10-CM | POA: Diagnosis not present

## 2016-11-10 DIAGNOSIS — G8110 Spastic hemiplegia affecting unspecified side: Secondary | ICD-10-CM

## 2016-11-10 DIAGNOSIS — R252 Cramp and spasm: Secondary | ICD-10-CM | POA: Insufficient documentation

## 2016-11-10 DIAGNOSIS — I69191 Dysphagia following nontraumatic intracerebral hemorrhage: Secondary | ICD-10-CM | POA: Insufficient documentation

## 2016-11-10 DIAGNOSIS — R5383 Other fatigue: Secondary | ICD-10-CM | POA: Diagnosis not present

## 2016-11-10 DIAGNOSIS — I10 Essential (primary) hypertension: Secondary | ICD-10-CM | POA: Insufficient documentation

## 2016-11-10 DIAGNOSIS — F329 Major depressive disorder, single episode, unspecified: Secondary | ICD-10-CM | POA: Insufficient documentation

## 2016-11-10 NOTE — Progress Notes (Signed)
Dysport Injection for spasticity using needle EMG guidance Indication: Spastic hemiplegia, dominant side (HCC)   Dilution: 250 Units/ml        Total Units Injected: 1000u Indication: Severe spasticity which interferes with ADL,mobility and/or  hygiene and is unresponsive to medication management and other conservative care Informed consent was obtained after describing risks and benefits of the procedure with the patient. This includes bleeding, bruising, infection, excessive weakness, or medication side effects. A REMS form is on file and signed.  Needle: 42mm injectable monopolar needle electrode  Number of units per muscle Pectoralis Major 50 units Pectoralis Minor 250 units Biceps 0 units Brachioradialis 0 units FCR 50 units FCU 50 units FDS 100 units FDP 100 units FPL 0 units Pronator Teres 300 units Pronator Quadratus 100 units    All injections were done after obtaining appropriate EMG activity and after negative drawback for blood. The patient tolerated the procedure well. Post procedure instructions were given. Return in about 2 months (around 01/11/2017).

## 2016-11-10 NOTE — Patient Instructions (Signed)
PLEASE FEEL FREE TO CALL OUR OFFICE WITH ANY PROBLEMS OR QUESTIONS ([phone])      

## 2017-01-11 ENCOUNTER — Encounter: Payer: Self-pay | Admitting: Physical Medicine & Rehabilitation

## 2017-01-11 ENCOUNTER — Encounter (INDEPENDENT_AMBULATORY_CARE_PROVIDER_SITE_OTHER): Payer: Self-pay

## 2017-01-11 ENCOUNTER — Encounter: Payer: Medicare Other | Attending: Physical Medicine & Rehabilitation | Admitting: Physical Medicine & Rehabilitation

## 2017-01-11 VITALS — BP 142/87 | HR 69

## 2017-01-11 DIAGNOSIS — I69320 Aphasia following cerebral infarction: Secondary | ICD-10-CM

## 2017-01-11 DIAGNOSIS — Z5189 Encounter for other specified aftercare: Secondary | ICD-10-CM | POA: Diagnosis not present

## 2017-01-11 DIAGNOSIS — I69920 Aphasia following unspecified cerebrovascular disease: Secondary | ICD-10-CM | POA: Diagnosis not present

## 2017-01-11 DIAGNOSIS — R200 Anesthesia of skin: Secondary | ICD-10-CM | POA: Diagnosis not present

## 2017-01-11 DIAGNOSIS — I69251 Hemiplegia and hemiparesis following other nontraumatic intracranial hemorrhage affecting right dominant side: Secondary | ICD-10-CM | POA: Diagnosis not present

## 2017-01-11 DIAGNOSIS — R252 Cramp and spasm: Secondary | ICD-10-CM | POA: Insufficient documentation

## 2017-01-11 DIAGNOSIS — F329 Major depressive disorder, single episode, unspecified: Secondary | ICD-10-CM | POA: Diagnosis not present

## 2017-01-11 DIAGNOSIS — I610 Nontraumatic intracerebral hemorrhage in hemisphere, subcortical: Secondary | ICD-10-CM | POA: Diagnosis not present

## 2017-01-11 DIAGNOSIS — K589 Irritable bowel syndrome without diarrhea: Secondary | ICD-10-CM | POA: Diagnosis not present

## 2017-01-11 DIAGNOSIS — G8110 Spastic hemiplegia affecting unspecified side: Secondary | ICD-10-CM

## 2017-01-11 DIAGNOSIS — R5383 Other fatigue: Secondary | ICD-10-CM | POA: Diagnosis not present

## 2017-01-11 DIAGNOSIS — I10 Essential (primary) hypertension: Secondary | ICD-10-CM | POA: Diagnosis not present

## 2017-01-11 DIAGNOSIS — E785 Hyperlipidemia, unspecified: Secondary | ICD-10-CM | POA: Diagnosis not present

## 2017-01-11 DIAGNOSIS — R531 Weakness: Secondary | ICD-10-CM | POA: Diagnosis not present

## 2017-01-11 DIAGNOSIS — I69191 Dysphagia following nontraumatic intracerebral hemorrhage: Secondary | ICD-10-CM | POA: Insufficient documentation

## 2017-01-11 NOTE — Patient Instructions (Signed)
PLEASE FEEL FREE TO CALL OUR OFFICE WITH ANY PROBLEMS OR QUESTIONS ([phone])      

## 2017-01-11 NOTE — Progress Notes (Signed)
Subjective:    Patient ID: [name], female    DOB: 1940/10/10, 76 y.o.   MRN: [id number]  HPI  [name] is here in follow up of her chronic right spastic hemiparesis. She is having good results with botox but has begun to increase again. She is noticing ongoing tightness in the pronators of her right arm/hand. She also notes extension posturing and tightness of the right foot when she attempt to ambulate  She is working on speech and language at home. She hasn't had formal therapy for a bit and would like to revisit. She does notice that her language and speech worsen when she's fatigued or under stress or in social situaions.   Her husband remains supportive as always.   Pain Inventory Average Pain 1 Pain Right Now 0 My pain is intermittent, dull and aching  In the last 24 hours, has pain interfered with the following? General activity 2 Relation with others 2 Enjoyment of life 3 What TIME of day is your pain at its worst? daytime Sleep (in general) Fair  Pain is worse with: unsure Pain improves with: rest and therapy/exercise Relief from Meds: 3  Mobility walk with assistance use a walker ability to climb steps?  yes do you drive?  no  Function retired  Neuro/Psych weakness numbness spasms loss of taste or smell  Prior Studies Any changes since last visit?  no  Physicians involved in your care Any changes since last visit?  no   Family History  Problem Relation Age of Onset  . Aortic aneurysm Father   . Heart failure Mother    Social History   Social History  . Marital status: Married    Spouse name: Dr. Genelle Bal  . Number of children: N/A  . Years of education: N/A   Social History Main Topics  . Smoking status: Never Smoker  . Smokeless tobacco: Never Used  . Alcohol use 0.6 oz/week    1 Glasses of wine per week     Comment: occasionally  . Drug use: No  . Sexual activity: Not on file   Other Topics Concern  . Not on file    Social History Narrative  . No narrative on file   Past Surgical History:  Procedure Laterality Date  . APPENDECTOMY    . BREAST LUMPECTOMY     Right-benign  . BRONCHOSCOPY     Video bronch w/ TBBX 2012  . CESAREAN SECTION     x 4  . TONSILLECTOMY    . VESICOVAGINAL FISTULA CLOSURE W/ TAH     Past Medical History:  Diagnosis Date  . Acute renal insufficiency    due to amphotericin 2012  . Allergic rhinitis, cause unspecified   . Bronchitis, not specified as acute or chronic   . Diverticulosis   . 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 Hypertension   . Irritable bowel   . Other chronic allergic conjunctivitis   . Other chronic sinusitis   . Stroke [facility])    There were no vitals taken for this visit.  Opioid Risk Score:   Fall Risk Score:  `1  Depression screen PHQ 2/9  Depression screen PHQ 2/9 10/06/2015  Decreased Interest 0  Down, Depressed, Hopeless 1  PHQ - 2 Score 1  Altered sleeping 3  Tired, decreased energy 0  Change in appetite 0  Feeling bad or failure about yourself  1  Trouble concentrating 2  Moving slowly or fidgety/restless 3  Suicidal thoughts 0  PHQ-9 Score 10  Difficult doing work/chores Very difficult     Review of Systems  Constitutional: Negative.   HENT: Negative.   Eyes: Negative.   Respiratory: Negative.   Cardiovascular: Negative.   Gastrointestinal: Negative.   Endocrine: Negative.   Genitourinary: Negative.   Musculoskeletal: Negative.   Skin: Negative.   Allergic/Immunologic: Negative.   Neurological: Negative.   Hematological: Negative.   Psychiatric/Behavioral: Negative.   All other systems reviewed and are negative.      Objective:   Physical Exam  Constitutional: She appears well-developed, well nourished. NAD. HENT: Normocephalic. Atraumatic. no visible bruising. Hearing appears functional Cardiovascular: Normal rate and regular rhythm. + systolic Murmur Respiratory: Effort normal and  breath sounds normal. No respiratory distress. no wheezes or rales GI: PEG site with hypergranulation Musc: minorsublux anterior right shoulder Neurological: improving language content, fluidity, volume Alert. DTRs 3+ right upper and right lower extremity  Motor: right central 7 Right upper extremity and right lower extremity: RUE trace to 0/5. 1/5 HAD, 1+KE , trace ADF/PF. Has resting heel cord tightness RLE Left upper and left lower extremity 4/5. Flexor tone RUE I spersistent--1/4 biceps, PT/PQ 1/4 wrist and finger flexors/thumb, tr to tr/4at pecs/ TeMj/TeMi. trace extensor1/4 RLE tib posterior/gastrocs.   Skin: Skin is warm and dry.  Psych: pleasant and up beat.    Assessment & Plan:  Medical Problem List and Plan: 1. 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 She is making nice gains!! -[name] is an option 3. Central neuropathic pain: - 4. Spasticity:   -baclofen   01-26-09-20 schedule as this appears to be her max dose -right knee brace, AFO, dyna splint -resting WFO   -has done quite well with botulinum toxin injections -will arrange for dysport to right pronator teres/quadratus, tibialis posterior/gastrocs -discssed positional considerations for elbow to avoid ulnar nerve compression 5. Neuropsych: mood has been up beat. Off all meds currently 6. Hypertension. Cozaar 100 mg daily. Some elevation again today.  -monitor regularly at home. No changes in regeimn 7. Recent fall. Hearing showing some improvement--we may consider ENT eval.   15 minutes of face to face patient care time were spent during this visit. All questions were encouraged and answered. Follow up in a month. Greater than 50% of time during this encounter was spent  counseling patient/family in regard to spasticity mgt, splinting, language treatment options.

## 2017-01-19 ENCOUNTER — Telehealth: Payer: Self-pay | Admitting: *Deleted

## 2017-01-19 DIAGNOSIS — I69122 Dysarthria following nontraumatic intracerebral hemorrhage: Secondary | ICD-10-CM | POA: Diagnosis not present

## 2017-01-19 DIAGNOSIS — G8111 Spastic hemiplegia affecting right dominant side: Secondary | ICD-10-CM | POA: Diagnosis not present

## 2017-01-19 DIAGNOSIS — R293 Abnormal posture: Secondary | ICD-10-CM | POA: Diagnosis not present

## 2017-01-19 DIAGNOSIS — I69120 Aphasia following nontraumatic intracerebral hemorrhage: Secondary | ICD-10-CM | POA: Diagnosis not present

## 2017-01-19 DIAGNOSIS — M6389 Disorders of muscle in diseases classified elsewhere, multiple sites: Secondary | ICD-10-CM | POA: Diagnosis not present

## 2017-01-19 DIAGNOSIS — I613 Nontraumatic intracerebral hemorrhage in brain stem: Secondary | ICD-10-CM | POA: Diagnosis not present

## 2017-01-19 NOTE — Telephone Encounter (Signed)
Naomi waller, [employer], Therapy department, left a message stating that she had a conversation with a clinical staff person about an OT referral.  She is calling to confirm if Dr. Naaman Plummer has signed the referral, or authorized the referral.  If so, she asks that it be faxed back

## 2017-01-19 NOTE — Telephone Encounter (Signed)
i'm not sure what referral she's referring to. If it gets her therapy, I will be happy to sign.

## 2017-01-20 NOTE — Telephone Encounter (Signed)
Fax signed, and transmitted

## 2017-01-26 DIAGNOSIS — I69120 Aphasia following nontraumatic intracerebral hemorrhage: Secondary | ICD-10-CM | POA: Diagnosis not present

## 2017-01-26 DIAGNOSIS — I613 Nontraumatic intracerebral hemorrhage in brain stem: Secondary | ICD-10-CM | POA: Diagnosis not present

## 2017-01-26 DIAGNOSIS — R293 Abnormal posture: Secondary | ICD-10-CM | POA: Diagnosis not present

## 2017-01-26 DIAGNOSIS — G8111 Spastic hemiplegia affecting right dominant side: Secondary | ICD-10-CM | POA: Diagnosis not present

## 2017-01-26 DIAGNOSIS — M6389 Disorders of muscle in diseases classified elsewhere, multiple sites: Secondary | ICD-10-CM | POA: Diagnosis not present

## 2017-01-26 DIAGNOSIS — I69122 Dysarthria following nontraumatic intracerebral hemorrhage: Secondary | ICD-10-CM | POA: Diagnosis not present

## 2017-01-28 DIAGNOSIS — G8111 Spastic hemiplegia affecting right dominant side: Secondary | ICD-10-CM | POA: Diagnosis not present

## 2017-01-28 DIAGNOSIS — R293 Abnormal posture: Secondary | ICD-10-CM | POA: Diagnosis not present

## 2017-01-28 DIAGNOSIS — I69120 Aphasia following nontraumatic intracerebral hemorrhage: Secondary | ICD-10-CM | POA: Diagnosis not present

## 2017-01-28 DIAGNOSIS — M6389 Disorders of muscle in diseases classified elsewhere, multiple sites: Secondary | ICD-10-CM | POA: Diagnosis not present

## 2017-01-28 DIAGNOSIS — I613 Nontraumatic intracerebral hemorrhage in brain stem: Secondary | ICD-10-CM | POA: Diagnosis not present

## 2017-01-28 DIAGNOSIS — I69122 Dysarthria following nontraumatic intracerebral hemorrhage: Secondary | ICD-10-CM | POA: Diagnosis not present

## 2017-02-07 DIAGNOSIS — G8111 Spastic hemiplegia affecting right dominant side: Secondary | ICD-10-CM | POA: Diagnosis not present

## 2017-02-07 DIAGNOSIS — R293 Abnormal posture: Secondary | ICD-10-CM | POA: Diagnosis not present

## 2017-02-07 DIAGNOSIS — I613 Nontraumatic intracerebral hemorrhage in brain stem: Secondary | ICD-10-CM | POA: Diagnosis not present

## 2017-02-07 DIAGNOSIS — I69120 Aphasia following nontraumatic intracerebral hemorrhage: Secondary | ICD-10-CM | POA: Diagnosis not present

## 2017-02-07 DIAGNOSIS — I69122 Dysarthria following nontraumatic intracerebral hemorrhage: Secondary | ICD-10-CM | POA: Diagnosis not present

## 2017-02-07 DIAGNOSIS — M6389 Disorders of muscle in diseases classified elsewhere, multiple sites: Secondary | ICD-10-CM | POA: Diagnosis not present

## 2017-02-08 ENCOUNTER — Encounter: Payer: Medicare Other | Attending: Physical Medicine & Rehabilitation | Admitting: Physical Medicine & Rehabilitation

## 2017-02-08 ENCOUNTER — Encounter: Payer: Self-pay | Admitting: Physical Medicine & Rehabilitation

## 2017-02-08 VITALS — BP 146/86 | HR 68

## 2017-02-08 DIAGNOSIS — I10 Essential (primary) hypertension: Secondary | ICD-10-CM | POA: Diagnosis not present

## 2017-02-08 DIAGNOSIS — I69191 Dysphagia following nontraumatic intracerebral hemorrhage: Secondary | ICD-10-CM | POA: Diagnosis not present

## 2017-02-08 DIAGNOSIS — R531 Weakness: Secondary | ICD-10-CM | POA: Insufficient documentation

## 2017-02-08 DIAGNOSIS — R252 Cramp and spasm: Secondary | ICD-10-CM | POA: Insufficient documentation

## 2017-02-08 DIAGNOSIS — R5383 Other fatigue: Secondary | ICD-10-CM | POA: Diagnosis not present

## 2017-02-08 DIAGNOSIS — I69251 Hemiplegia and hemiparesis following other nontraumatic intracranial hemorrhage affecting right dominant side: Secondary | ICD-10-CM | POA: Diagnosis not present

## 2017-02-08 DIAGNOSIS — G8110 Spastic hemiplegia affecting unspecified side: Secondary | ICD-10-CM

## 2017-02-08 DIAGNOSIS — E785 Hyperlipidemia, unspecified: Secondary | ICD-10-CM | POA: Insufficient documentation

## 2017-02-08 DIAGNOSIS — R200 Anesthesia of skin: Secondary | ICD-10-CM | POA: Diagnosis not present

## 2017-02-08 DIAGNOSIS — F329 Major depressive disorder, single episode, unspecified: Secondary | ICD-10-CM | POA: Diagnosis not present

## 2017-02-08 DIAGNOSIS — K589 Irritable bowel syndrome without diarrhea: Secondary | ICD-10-CM | POA: Insufficient documentation

## 2017-02-08 DIAGNOSIS — Z5189 Encounter for other specified aftercare: Secondary | ICD-10-CM | POA: Insufficient documentation

## 2017-02-08 NOTE — Patient Instructions (Signed)
PLEASE FEEL FREE TO CALL OUR OFFICE WITH ANY PROBLEMS OR QUESTIONS ([phone])      

## 2017-02-08 NOTE — Progress Notes (Signed)
Dysport Injection for spasticity using needle EMG guidance Indication:  Spastic right hemiparesis   Dilution: 500 Units/38ml        Total Units Injected:  1000 Indication: Severe spasticity which interferes with ADL,mobility and/or  hygiene and is unresponsive to medication management and other conservative care Informed consent was obtained after describing risks and benefits of the procedure with the patient. This includes bleeding, bruising, infection, excessive weakness, or medication side effects. A REMS form is on file and signed.  Needle: 49mm injectable monopolar needle electrode  Number of units per muscle Pectoralis Major 0 units Pectoralis Minor 0 units Biceps 0 units Brachioradialis 300 units FCR 0 units FCU 0 units FDS 0 units FDP 0 units FPL 0 units Pronator Teres 150 units Pronator Quadratus 50 units Right quad 200 u Right gastroc 100 u Right tibialis posterior 200 u  All injections were done after obtaining appropriate EMG activity and after negative drawback for blood. The patient tolerated the procedure well. Post procedure instructions were given.

## 2017-02-10 DIAGNOSIS — Z23 Encounter for immunization: Secondary | ICD-10-CM | POA: Diagnosis not present

## 2017-02-11 DIAGNOSIS — R293 Abnormal posture: Secondary | ICD-10-CM | POA: Diagnosis not present

## 2017-02-11 DIAGNOSIS — G8111 Spastic hemiplegia affecting right dominant side: Secondary | ICD-10-CM | POA: Diagnosis not present

## 2017-02-11 DIAGNOSIS — I69120 Aphasia following nontraumatic intracerebral hemorrhage: Secondary | ICD-10-CM | POA: Diagnosis not present

## 2017-02-11 DIAGNOSIS — I613 Nontraumatic intracerebral hemorrhage in brain stem: Secondary | ICD-10-CM | POA: Diagnosis not present

## 2017-02-11 DIAGNOSIS — M6389 Disorders of muscle in diseases classified elsewhere, multiple sites: Secondary | ICD-10-CM | POA: Diagnosis not present

## 2017-02-11 DIAGNOSIS — I69122 Dysarthria following nontraumatic intracerebral hemorrhage: Secondary | ICD-10-CM | POA: Diagnosis not present

## 2017-02-16 DIAGNOSIS — G8111 Spastic hemiplegia affecting right dominant side: Secondary | ICD-10-CM | POA: Diagnosis not present

## 2017-02-16 DIAGNOSIS — I69120 Aphasia following nontraumatic intracerebral hemorrhage: Secondary | ICD-10-CM | POA: Diagnosis not present

## 2017-02-16 DIAGNOSIS — I69122 Dysarthria following nontraumatic intracerebral hemorrhage: Secondary | ICD-10-CM | POA: Diagnosis not present

## 2017-02-16 DIAGNOSIS — M6389 Disorders of muscle in diseases classified elsewhere, multiple sites: Secondary | ICD-10-CM | POA: Diagnosis not present

## 2017-02-16 DIAGNOSIS — I613 Nontraumatic intracerebral hemorrhage in brain stem: Secondary | ICD-10-CM | POA: Diagnosis not present

## 2017-02-16 DIAGNOSIS — R293 Abnormal posture: Secondary | ICD-10-CM | POA: Diagnosis not present

## 2017-02-18 DIAGNOSIS — I613 Nontraumatic intracerebral hemorrhage in brain stem: Secondary | ICD-10-CM | POA: Diagnosis not present

## 2017-02-18 DIAGNOSIS — I69122 Dysarthria following nontraumatic intracerebral hemorrhage: Secondary | ICD-10-CM | POA: Diagnosis not present

## 2017-02-18 DIAGNOSIS — I69120 Aphasia following nontraumatic intracerebral hemorrhage: Secondary | ICD-10-CM | POA: Diagnosis not present

## 2017-02-21 DIAGNOSIS — H04123 Dry eye syndrome of bilateral lacrimal glands: Secondary | ICD-10-CM | POA: Diagnosis not present

## 2017-02-21 DIAGNOSIS — H25812 Combined forms of age-related cataract, left eye: Secondary | ICD-10-CM | POA: Diagnosis not present

## 2017-02-21 DIAGNOSIS — H401213 Low-tension glaucoma, right eye, severe stage: Secondary | ICD-10-CM | POA: Diagnosis not present

## 2017-02-23 DIAGNOSIS — I613 Nontraumatic intracerebral hemorrhage in brain stem: Secondary | ICD-10-CM | POA: Diagnosis not present

## 2017-02-23 DIAGNOSIS — I69122 Dysarthria following nontraumatic intracerebral hemorrhage: Secondary | ICD-10-CM | POA: Diagnosis not present

## 2017-02-23 DIAGNOSIS — I69120 Aphasia following nontraumatic intracerebral hemorrhage: Secondary | ICD-10-CM | POA: Diagnosis not present

## 2017-02-25 DIAGNOSIS — I69122 Dysarthria following nontraumatic intracerebral hemorrhage: Secondary | ICD-10-CM | POA: Diagnosis not present

## 2017-02-25 DIAGNOSIS — I69120 Aphasia following nontraumatic intracerebral hemorrhage: Secondary | ICD-10-CM | POA: Diagnosis not present

## 2017-02-25 DIAGNOSIS — I613 Nontraumatic intracerebral hemorrhage in brain stem: Secondary | ICD-10-CM | POA: Diagnosis not present

## 2017-03-02 DIAGNOSIS — I69122 Dysarthria following nontraumatic intracerebral hemorrhage: Secondary | ICD-10-CM | POA: Diagnosis not present

## 2017-03-02 DIAGNOSIS — I613 Nontraumatic intracerebral hemorrhage in brain stem: Secondary | ICD-10-CM | POA: Diagnosis not present

## 2017-03-02 DIAGNOSIS — I69120 Aphasia following nontraumatic intracerebral hemorrhage: Secondary | ICD-10-CM | POA: Diagnosis not present

## 2017-03-03 DIAGNOSIS — I613 Nontraumatic intracerebral hemorrhage in brain stem: Secondary | ICD-10-CM | POA: Diagnosis not present

## 2017-03-03 DIAGNOSIS — I69122 Dysarthria following nontraumatic intracerebral hemorrhage: Secondary | ICD-10-CM | POA: Diagnosis not present

## 2017-03-03 DIAGNOSIS — I69120 Aphasia following nontraumatic intracerebral hemorrhage: Secondary | ICD-10-CM | POA: Diagnosis not present

## 2017-03-05 IMAGING — CT CT HEAD W/O CM
2 of 10 series · 4 of 47 positions shown, 5 images · non-contrast
Comparison: CT of the head performed 08/14/2015, and MRI/MRA of the
brain performed 08/02/2015

CLINICAL DATA: Status post fall from bed, hitting head on night
stand. Concern for head or cervical spine injury. Initial encounter.

EXAM:
CT HEAD WITHOUT CONTRAST
CT CERVICAL SPINE WITHOUT CONTRAST
TECHNIQUE: Multidetector CT imaging of the head and cervical spine was
performed following the standard protocol without intravenous
contrast. Multiplanar CT image reconstructions of the cervical spine
were also generated.

[Series 407: coronal bone · coronal · 0.34mm/px · 1 of 48 slices shown]
[im 46/48  brain]
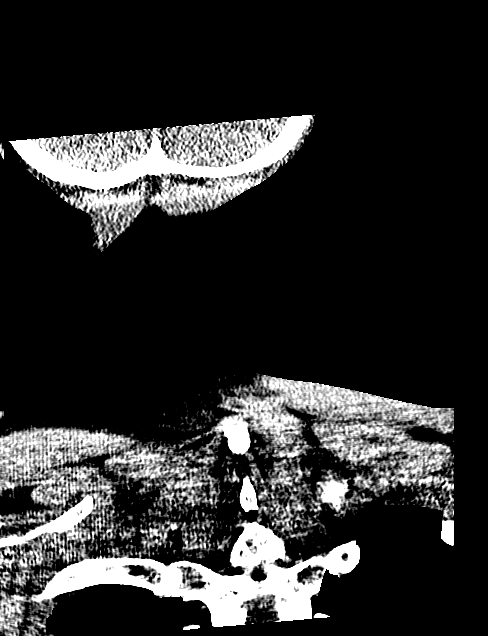

[Series 4011: coronal st · coronal · 0.35mm/px · 3 of 49 slices shown, 4 images]
[im 33/49  brain]
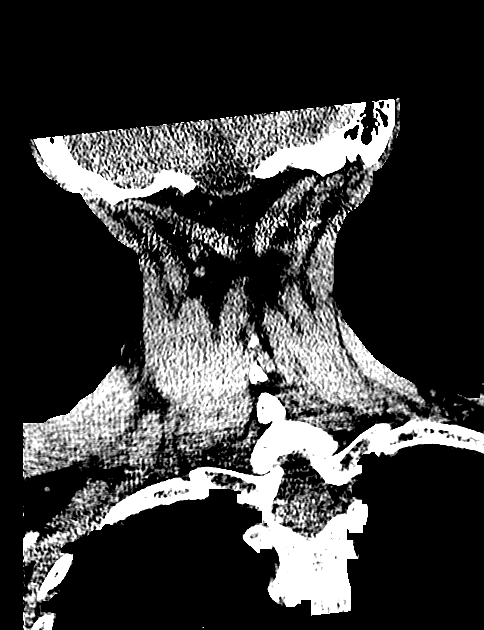
[im 33/49  bone]
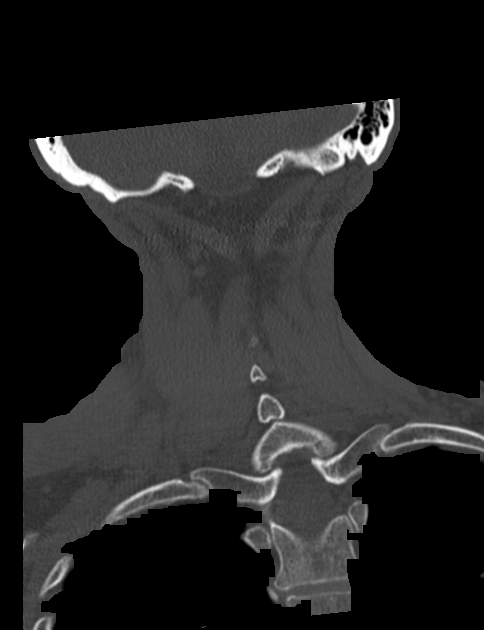
[im 38/49  brain]
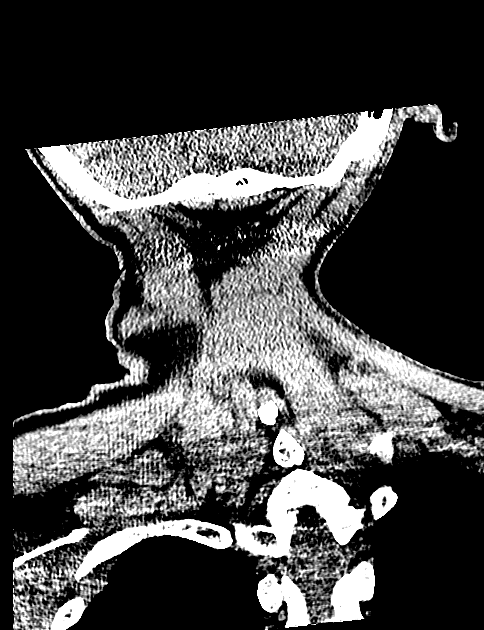
[im 43/49  brain]
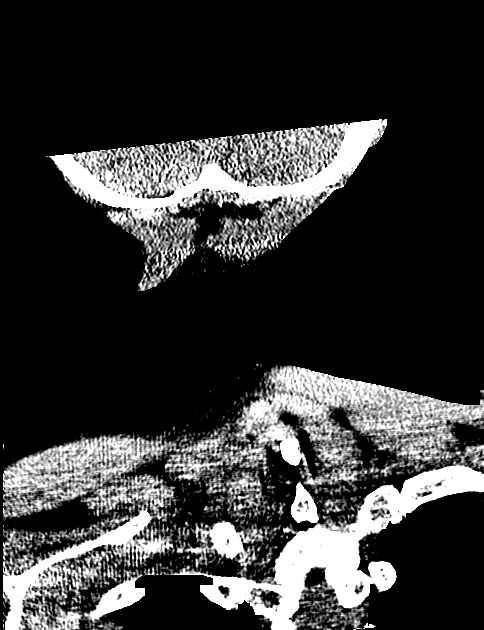

[4 of 47 positions shown; findings below may reference images not displayed]

FINDINGS: CT HEAD FINDINGS

Brain: No evidence of acute infarction, hemorrhage, hydrocephalus,
extra-axial collection or mass lesion/mass effect.

Prominence of the ventricles and sulci reflects mild cortical volume
loss. Cerebellar atrophy is noted. Scattered periventricular and
subcortical white matter change likely reflects small vessel
ischemic microangiopathy. Chronic encephalomalacia is noted at the
left basal ganglia, extending minimally into the left cerebral
peduncle.

The brainstem and fourth ventricle are within normal limits. The
cerebral hemispheres demonstrate grossly normal gray-white
differentiation. No mass effect or midline shift is seen.

Vascular: No hyperdense vessel or unexpected calcification.

Skull: There is no evidence of fracture; visualized osseous
structures are unremarkable in appearance.

Sinuses/Orbits: The orbits are within normal limits. There is
opacification of the left maxillary sinus and ethmoid air cells. The
remaining paranasal sinuses and mastoid air cells are well-aerated.

Other: No significant soft tissue abnormalities are seen.

CT CERVICAL SPINE FINDINGS

Alignment: There is mild grade 1 anterolisthesis of C5 on C6, and
mild grade 1 retrolisthesis of C6 on C7.

Skull base and vertebrae: No acute fracture. No primary bone lesion
or focal pathologic process.

Soft tissues and spinal canal: No prevertebral fluid or swelling. No
visible canal hematoma.

Disc levels: Mild disc space narrowing is noted along the lower
cervical spine. Degenerative change is seen about the dens. There is
chronic osseous fusion at the posterior aspects of vertebral bodies
C3 and C4. Small anterior and posterior disc osteophyte complexes
are noted along the lower cervical spine, with underlying facet
disease.

Upper chest: Scarring is noted at the lung apices. The thyroid gland
is unremarkable in appearance. Calcification is seen at the carotid
bifurcations bilaterally.

Other: No additional soft tissue abnormalities are seen.
IMPRESSION: 1. No evidence of traumatic intracranial injury or fracture.
2. No evidence of acute fracture or subluxation along the cervical
spine.
3. Mild cortical volume loss and scattered small vessel ischemic
microangiopathy.
4. Chronic encephalomalacia at the left basal ganglia, extending
minimally into the left cerebral peduncle.
5. Mild degenerative change along the cervical spine. Chronic
osseous fusion at the posterior aspects of vertebral bodies C3 and
C4.
6. Scarring at the lung apices.
7. Opacification of the left maxillary sinus and ethmoid air cells.
8. Calcification at the carotid bifurcations bilaterally. Carotid
ultrasound would be helpful for further evaluation, when and as
deemed clinically appropriate.

## 2017-03-07 DIAGNOSIS — I69120 Aphasia following nontraumatic intracerebral hemorrhage: Secondary | ICD-10-CM | POA: Diagnosis not present

## 2017-03-07 DIAGNOSIS — I69122 Dysarthria following nontraumatic intracerebral hemorrhage: Secondary | ICD-10-CM | POA: Diagnosis not present

## 2017-03-07 DIAGNOSIS — I613 Nontraumatic intracerebral hemorrhage in brain stem: Secondary | ICD-10-CM | POA: Diagnosis not present

## 2017-03-09 DIAGNOSIS — I69122 Dysarthria following nontraumatic intracerebral hemorrhage: Secondary | ICD-10-CM | POA: Diagnosis not present

## 2017-03-09 DIAGNOSIS — I613 Nontraumatic intracerebral hemorrhage in brain stem: Secondary | ICD-10-CM | POA: Diagnosis not present

## 2017-03-09 DIAGNOSIS — I69120 Aphasia following nontraumatic intracerebral hemorrhage: Secondary | ICD-10-CM | POA: Diagnosis not present

## 2017-03-14 DIAGNOSIS — I69122 Dysarthria following nontraumatic intracerebral hemorrhage: Secondary | ICD-10-CM | POA: Diagnosis not present

## 2017-03-14 DIAGNOSIS — I69120 Aphasia following nontraumatic intracerebral hemorrhage: Secondary | ICD-10-CM | POA: Diagnosis not present

## 2017-03-14 DIAGNOSIS — I613 Nontraumatic intracerebral hemorrhage in brain stem: Secondary | ICD-10-CM | POA: Diagnosis not present

## 2017-03-16 DIAGNOSIS — I69120 Aphasia following nontraumatic intracerebral hemorrhage: Secondary | ICD-10-CM | POA: Diagnosis not present

## 2017-03-16 DIAGNOSIS — I69122 Dysarthria following nontraumatic intracerebral hemorrhage: Secondary | ICD-10-CM | POA: Diagnosis not present

## 2017-03-16 DIAGNOSIS — I613 Nontraumatic intracerebral hemorrhage in brain stem: Secondary | ICD-10-CM | POA: Diagnosis not present

## 2017-03-23 DIAGNOSIS — I69120 Aphasia following nontraumatic intracerebral hemorrhage: Secondary | ICD-10-CM | POA: Diagnosis not present

## 2017-03-23 DIAGNOSIS — I69122 Dysarthria following nontraumatic intracerebral hemorrhage: Secondary | ICD-10-CM | POA: Diagnosis not present

## 2017-03-23 DIAGNOSIS — I613 Nontraumatic intracerebral hemorrhage in brain stem: Secondary | ICD-10-CM | POA: Diagnosis not present

## 2017-04-01 DIAGNOSIS — I69122 Dysarthria following nontraumatic intracerebral hemorrhage: Secondary | ICD-10-CM | POA: Diagnosis not present

## 2017-04-01 DIAGNOSIS — I69120 Aphasia following nontraumatic intracerebral hemorrhage: Secondary | ICD-10-CM | POA: Diagnosis not present

## 2017-04-01 DIAGNOSIS — I613 Nontraumatic intracerebral hemorrhage in brain stem: Secondary | ICD-10-CM | POA: Diagnosis not present

## 2017-04-05 ENCOUNTER — Encounter: Payer: Medicare Other | Attending: Physical Medicine & Rehabilitation | Admitting: Physical Medicine & Rehabilitation

## 2017-04-05 ENCOUNTER — Encounter: Payer: Self-pay | Admitting: Physical Medicine & Rehabilitation

## 2017-04-05 VITALS — BP 139/80 | HR 74

## 2017-04-05 DIAGNOSIS — R531 Weakness: Secondary | ICD-10-CM | POA: Diagnosis not present

## 2017-04-05 DIAGNOSIS — R252 Cramp and spasm: Secondary | ICD-10-CM | POA: Diagnosis not present

## 2017-04-05 DIAGNOSIS — Z5189 Encounter for other specified aftercare: Secondary | ICD-10-CM | POA: Insufficient documentation

## 2017-04-05 DIAGNOSIS — K589 Irritable bowel syndrome without diarrhea: Secondary | ICD-10-CM | POA: Diagnosis not present

## 2017-04-05 DIAGNOSIS — I69191 Dysphagia following nontraumatic intracerebral hemorrhage: Secondary | ICD-10-CM | POA: Insufficient documentation

## 2017-04-05 DIAGNOSIS — I69251 Hemiplegia and hemiparesis following other nontraumatic intracranial hemorrhage affecting right dominant side: Secondary | ICD-10-CM | POA: Insufficient documentation

## 2017-04-05 DIAGNOSIS — G8110 Spastic hemiplegia affecting unspecified side: Secondary | ICD-10-CM | POA: Diagnosis not present

## 2017-04-05 DIAGNOSIS — I610 Nontraumatic intracerebral hemorrhage in hemisphere, subcortical: Secondary | ICD-10-CM

## 2017-04-05 DIAGNOSIS — R5383 Other fatigue: Secondary | ICD-10-CM | POA: Diagnosis not present

## 2017-04-05 DIAGNOSIS — F329 Major depressive disorder, single episode, unspecified: Secondary | ICD-10-CM | POA: Diagnosis not present

## 2017-04-05 DIAGNOSIS — R200 Anesthesia of skin: Secondary | ICD-10-CM | POA: Diagnosis not present

## 2017-04-05 DIAGNOSIS — I10 Essential (primary) hypertension: Secondary | ICD-10-CM | POA: Diagnosis not present

## 2017-04-05 DIAGNOSIS — E785 Hyperlipidemia, unspecified: Secondary | ICD-10-CM | POA: Diagnosis not present

## 2017-04-05 NOTE — Patient Instructions (Signed)
BACLOFEN TAPER:  WEEK 1: 5MG -5MG -5MG -5MG    WEEK 2:  5MG  TID  WEEK 3: 5MG  BID  WEEK 4: 5MG  QHS  WEEK 5 AND BEYOND: OFF

## 2017-04-05 NOTE — Progress Notes (Signed)
Subjective:    Patient ID: [name], female    DOB: [birth year], 76 y.o.   MRN: [id number]  HPI   Mrs. Quinter is here in follow up of her spastic right hemiparesis. We performed dysport at last visit and divided between her RUE and RLE. The lower amount in the arm was not effective as it has been. The quad injection weakened her quad and altered her gait pattern. She has gradually recovered the knee and it's back to near  Baseline.    She is finding that she's more sensitive to baclofen. She is only taking 5mg  during the day and afternoon and 10mg  at night, and even this amount is making her feel sleepy.   She continues with her hemi-walker and afo/knee brace for stability which works well for her.   Pain Inventory Average Pain 2 Pain Right Now 2 My pain is intermittent and stabbing  In the last 24 hours, has pain interfered with the following? General activity 2 Relation with others 3 Enjoyment of life 3 What TIME of day is your pain at its worst? evening Sleep (in general) Fair  Pain is worse with: . Pain improves with: rest Relief from Meds: 8  Mobility use a cane use a walker ability to climb steps?  yes do you drive?  no  Function retired  Neuro/Psych bowel control problems numbness trouble walking spasms confusion  Prior Studies Any changes since last visit?  no  Physicians involved in your care Any changes since last visit?  no   Family History  Problem Relation Age of Onset  . Aortic aneurysm Father   . Heart failure Mother    Social History   Socioeconomic History  . Marital status: Married    Spouse name: Dr. Genelle Bal  . Number of children: Not on file  . Years of education: Not on file  . Highest education level: Not on file  Social Needs  . Financial resource strain: Not on file  . Food insecurity - worry: Not on file  . Food insecurity - inability: Not on file  . Transportation needs - medical: Not on file  .  Transportation needs - non-medical: Not on file  Occupational History  . Not on file  Tobacco Use  . Smoking status: Never Smoker  . Smokeless tobacco: Never Used  Substance and Sexual Activity  . Alcohol use: Yes    Alcohol/week: 0.6 oz    Types: 1 Glasses of wine per week    Comment: occasionally  . Drug use: No  . Sexual activity: Not on file  Other Topics Concern  . Not on file  Social History Narrative  . Not on file   Past Surgical History:  Procedure Laterality Date  . APPENDECTOMY    . BREAST LUMPECTOMY     Right-benign  . BRONCHOSCOPY     Video bronch w/ TBBX 2012  . CESAREAN SECTION     x 4  . TONSILLECTOMY    . VESICOVAGINAL FISTULA CLOSURE W/ TAH     Past Medical History:  Diagnosis Date  . Acute renal insufficiency    due to amphotericin 2012  . Allergic rhinitis, cause unspecified   . Bronchitis, not specified as acute or chronic   . Diverticulosis   . 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 Hypertension   . Irritable bowel   . Other chronic allergic conjunctivitis   . Other chronic sinusitis   . Stroke [facility])  There were no vitals taken for this visit.  Opioid Risk Score:   Fall Risk Score:  `1  Depression screen PHQ 2/9  Depression screen PHQ 2/9 10/06/2015  Decreased Interest 0  Down, Depressed, Hopeless 1  PHQ - 2 Score 1  Altered sleeping 3  Tired, decreased energy 0  Change in appetite 0  Feeling bad or failure about yourself  1  Trouble concentrating 2  Moving slowly or fidgety/restless 3  Suicidal thoughts 0  PHQ-9 Score 10  Difficult doing work/chores Very difficult     Review of Systems  Constitutional: Negative.   HENT: Negative.   Eyes: Negative.   Respiratory: Negative.   Cardiovascular: Negative.   Gastrointestinal: Negative.   Endocrine: Negative.   Genitourinary: Negative.   Musculoskeletal: Negative.   Skin: Negative.   Allergic/Immunologic: Negative.   Neurological: Negative.   Hematological:  Negative.   Psychiatric/Behavioral: Negative.   All other systems reviewed and are negative.      Objective:   Physical Exam  Constitutional: She appears well-developed, well nourished. NAD. HENT: Normocephalic. Atraumatic. no visible bruising. Hearing appears functional Cardiovascular: Normal rate and regular rhythm. + systolic Murmur Respiratory: Effort normal and breath sounds normal. No respiratory distress. no wheezes or rales GI: PEG site with hypergranulation Musc: minorsublux anterior right shoulder Neurological: improving language content, fluidity, volume Alert. DTRs 3+ right upper and right lower extremity  Motor: right central 7 Right upper extremity and right lower extremity: RUE trace to 1/5. 1/5 HAD, 1+KE , trace ADF/PF. Has resting heel cord tightness RLE Left upper and left lower extremity 4/5. Flexor tone RUE I spersistent--1/4 biceps, PT/PQ 2/4 wrist and finger flexors/thumb, 1/4at pecs/ TeMj/TeMi. trace extensor1/4 RLE tib posterior/gastrocs.   Skin: Skin is warm and dry.  Psych: pleasant and up beat.    Assessment & Plan:  Medical Problem List and Plan: 1. 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 Central neuropathic pain: - 4. Spasticity:  -baclofen begin taper to off.  -right knee brace, AFO, dyna splint -resting WFO   -has done quite well with botulinum toxin injections -will arrange for dysport to right upper ext, 1000 u -consider return to outpt therapy this winter 5. Neuropsych: mood has been up beat. Off all meds currently 6. Hypertension. Cozaar 100 mg daily. Some elevation again today.  -monitor regularly at home. No changes in regeimn  .   15 minutes of face to face  patient care time were spent during this visit. All questions were encouraged and answered. Follow up in a month. Greater than 50% of time during this encounter was spent counseling patient/family in regard to spasticity mgt/orthotics Follow up next month for dysport injections.  Marland Kitchen

## 2017-04-08 DIAGNOSIS — I69120 Aphasia following nontraumatic intracerebral hemorrhage: Secondary | ICD-10-CM | POA: Diagnosis not present

## 2017-04-08 DIAGNOSIS — I69122 Dysarthria following nontraumatic intracerebral hemorrhage: Secondary | ICD-10-CM | POA: Diagnosis not present

## 2017-04-08 DIAGNOSIS — I613 Nontraumatic intracerebral hemorrhage in brain stem: Secondary | ICD-10-CM | POA: Diagnosis not present

## 2017-04-15 ENCOUNTER — Telehealth: Payer: Self-pay | Admitting: Physical Medicine & Rehabilitation

## 2017-04-15 NOTE — Telephone Encounter (Signed)
Created in error

## 2017-04-21 ENCOUNTER — Telehealth: Payer: Self-pay | Admitting: Physical Medicine & Rehabilitation

## 2017-05-03 ENCOUNTER — Ambulatory Visit: Payer: Medicare Other | Admitting: Neurology

## 2017-05-16 ENCOUNTER — Encounter: Payer: Medicare Other | Attending: Physical Medicine & Rehabilitation | Admitting: Physical Medicine & Rehabilitation

## 2017-05-16 ENCOUNTER — Encounter: Payer: Self-pay | Admitting: Physical Medicine & Rehabilitation

## 2017-05-16 VITALS — BP 165/78 | HR 66

## 2017-05-16 DIAGNOSIS — F329 Major depressive disorder, single episode, unspecified: Secondary | ICD-10-CM | POA: Diagnosis not present

## 2017-05-16 DIAGNOSIS — G8110 Spastic hemiplegia affecting unspecified side: Secondary | ICD-10-CM | POA: Diagnosis not present

## 2017-05-16 DIAGNOSIS — R531 Weakness: Secondary | ICD-10-CM | POA: Diagnosis not present

## 2017-05-16 DIAGNOSIS — I69251 Hemiplegia and hemiparesis following other nontraumatic intracranial hemorrhage affecting right dominant side: Secondary | ICD-10-CM | POA: Diagnosis not present

## 2017-05-16 DIAGNOSIS — I610 Nontraumatic intracerebral hemorrhage in hemisphere, subcortical: Secondary | ICD-10-CM

## 2017-05-16 DIAGNOSIS — K589 Irritable bowel syndrome without diarrhea: Secondary | ICD-10-CM | POA: Insufficient documentation

## 2017-05-16 DIAGNOSIS — R5383 Other fatigue: Secondary | ICD-10-CM | POA: Diagnosis not present

## 2017-05-16 DIAGNOSIS — I69191 Dysphagia following nontraumatic intracerebral hemorrhage: Secondary | ICD-10-CM | POA: Insufficient documentation

## 2017-05-16 DIAGNOSIS — R252 Cramp and spasm: Secondary | ICD-10-CM | POA: Insufficient documentation

## 2017-05-16 DIAGNOSIS — I10 Essential (primary) hypertension: Secondary | ICD-10-CM | POA: Diagnosis not present

## 2017-05-16 DIAGNOSIS — Z5189 Encounter for other specified aftercare: Secondary | ICD-10-CM | POA: Insufficient documentation

## 2017-05-16 DIAGNOSIS — E785 Hyperlipidemia, unspecified: Secondary | ICD-10-CM | POA: Diagnosis not present

## 2017-05-16 DIAGNOSIS — R200 Anesthesia of skin: Secondary | ICD-10-CM | POA: Insufficient documentation

## 2017-05-16 NOTE — Progress Notes (Signed)
Dysport Injection for spasticity using needle EMG guidance Indication:  Spastic right hemiparesis   Dilution: 500 Units/77ml        Total Units Injected:  1000 Indication: Severe spasticity which interferes with ADL,mobility and/or  hygiene and is unresponsive to medication management and other conservative care Informed consent was obtained after describing risks and benefits of the procedure with the patient. This includes bleeding, bruising, infection, excessive weakness, or medication side effects. A REMS form is on file and signed.  Needle: 42mm injectable monopolar needle electrode  Number of units per muscle Pectoralis Major 200 units Pectoralis Minor 50 units Biceps 400 units Brachioradialis 100 units FCR 0 units FCU 0 units FDS 75 units FDP 75 units FPL 0 units Pronator Teres 100 units Pronator Quadratus 0 units  All injections were done after obtaining appropriate EMG activity and after negative drawback for blood. The patient tolerated the procedure well. Post procedure instructions were given.  Resume baclofen at 5mg  bid and 10mg  qhs, titrate to 5mg  TID and 10mg  qhs

## 2017-05-16 NOTE — Patient Instructions (Signed)
PLEASE FEEL FREE TO CALL OUR OFFICE WITH ANY PROBLEMS OR QUESTIONS ([phone])      

## 2017-06-04 ENCOUNTER — Encounter (HOSPITAL_BASED_OUTPATIENT_CLINIC_OR_DEPARTMENT_OTHER): Payer: Self-pay | Admitting: Emergency Medicine

## 2017-06-04 ENCOUNTER — Other Ambulatory Visit: Payer: Self-pay

## 2017-06-04 ENCOUNTER — Emergency Department (HOSPITAL_BASED_OUTPATIENT_CLINIC_OR_DEPARTMENT_OTHER)
Admission: EM | Admit: 2017-06-04 | Discharge: 2017-06-04 | Disposition: A | Discharge status: Home / Self Care | Payer: Medicare Other | Attending: Emergency Medicine | Admitting: Emergency Medicine

## 2017-06-04 DIAGNOSIS — I10 Essential (primary) hypertension: Secondary | ICD-10-CM | POA: Diagnosis not present

## 2017-06-04 DIAGNOSIS — Z8673 Personal history of transient ischemic attack (TIA), and cerebral infarction without residual deficits: Secondary | ICD-10-CM | POA: Insufficient documentation

## 2017-06-04 DIAGNOSIS — R339 Retention of urine, unspecified: Secondary | ICD-10-CM | POA: Diagnosis present

## 2017-06-04 DIAGNOSIS — R39198 Other difficulties with micturition: Secondary | ICD-10-CM | POA: Diagnosis not present

## 2017-06-04 DIAGNOSIS — Z79899 Other long term (current) drug therapy: Secondary | ICD-10-CM | POA: Insufficient documentation

## 2017-06-04 DIAGNOSIS — R34 Anuria and oliguria: Secondary | ICD-10-CM

## 2017-06-04 LAB — CBC WITH DIFFERENTIAL/PLATELET
Basophils Absolute: 0 10*3/uL (ref 0.0–0.1)
Basophils Relative: 0 %
Eosinophils Absolute: 0.2 10*3/uL (ref 0.0–0.7)
Eosinophils Relative: 2 %
HCT: 40.2 % (ref 36.0–46.0)
HEMOGLOBIN: 13.8 g/dL (ref 12.0–15.0)
LYMPHS ABS: 1.1 10*3/uL (ref 0.7–4.0)
Lymphocytes Relative: 11 %
MCH: 30.9 pg (ref 26.0–34.0)
MCHC: 34.3 g/dL (ref 30.0–36.0)
MCV: 90.1 fL (ref 78.0–100.0)
Monocytes Absolute: 0.8 10*3/uL (ref 0.1–1.0)
Monocytes Relative: 8 %
NEUTROS PCT: 79 %
Neutro Abs: 8 10*3/uL — ABNORMAL HIGH (ref 1.7–7.7)
Platelets: 360 10*3/uL (ref 150–400)
RBC: 4.46 MIL/uL (ref 3.87–5.11)
RDW: 14.4 % (ref 11.5–15.5)
WBC: 10.2 10*3/uL (ref 4.0–10.5)

## 2017-06-04 LAB — BASIC METABOLIC PANEL
Anion gap: 10 (ref 5–15)
BUN: 20 mg/dL (ref 6–20)
CO2: 29 mmol/L (ref 22–32)
Calcium: 9.8 mg/dL (ref 8.9–10.3)
Chloride: 104 mmol/L (ref 101–111)
Creatinine, Ser: 0.68 mg/dL (ref 0.44–1.00)
GFR calc non Af Amer: >60 mL/min (ref >60)
Glucose, Bld: 97 mg/dL (ref 65–99)
POTASSIUM: 4.1 mmol/L (ref 3.5–5.1)
Sodium: 143 mmol/L (ref 135–145)

## 2017-06-04 LAB — URINALYSIS, ROUTINE W REFLEX MICROSCOPIC
BILIRUBIN URINE: NEGATIVE
Glucose, UA: NEGATIVE mg/dL
Hgb urine dipstick: NEGATIVE
Ketones, ur: 15 mg/dL — AB
LEUKOCYTES UA: NEGATIVE
NITRITE: NEGATIVE
Protein, ur: NEGATIVE mg/dL
SPECIFIC GRAVITY, URINE: 1.02 (ref 1.005–1.030)
pH: 7.5 (ref 5.0–8.0)

## 2017-06-04 NOTE — ED Notes (Signed)
Fluid Challenge tolerated well. Pt able to use restroom without difficulty via wheelchair from home.

## 2017-06-04 NOTE — ED Provider Notes (Signed)
Medical screening examination/treatment/procedure(s) were conducted as a shared visit with non-physician practitioner(s) and myself.  I personally evaluated the patient during the encounter. Briefly, the patient is a 77 y.o. female here with no UOP in 2 days. H/o constipation. Prior CVA with right sided deficits. POCUS not suspicious for retention. UA without evidence of infection or hematuria.. Labs grossly reassuring without significant electrolyte derangements or renal insufficiency.  Patient was able to void on her own. .   EKG Interpretation None       EMERGENCY DEPARTMENT ULTRASOUND  Study: Limited Ultrasound of Bladder  INDICATIONS: to assess for urinary retention and/or bladder volume prior to urinary catheter Multiple views of the bladder were obtained in real-time in the transverse and longitudinal planes with a multi-frequency probe.  PERFORMED BY: Myself IMAGES ARCHIVED?: Yes LIMITATIONS:  symphysis INTERPRETATION: Volume Measurement approx 110cc    The patient appears reasonably screened and/or stabilized for discharge and I doubt any other medical condition or other [facility] requiring further screening, evaluation, or treatment in the ED at this time prior to discharge.  The patient is safe for discharge with strict return precautions.       Fatima Blank, MD 06/04/17 1816

## 2017-06-04 NOTE — ED Triage Notes (Signed)
Reports that she has not urinated since yesterday.

## 2017-06-04 NOTE — ED Provider Notes (Signed)
[facility] EMERGENCY DEPARTMENT Provider Note   CSN: [id number] Arrival date & time: 06/04/17  1013     History   Chief Complaint Chief Complaint  Patient presents with  . Urinary Retention    HPI [name] is a 77 y.o. female.  Patient with history of stroke, constipation --presents with complaint of urinary retention.  Patient has only had 30-40 cc she has had some lower abdominal pain at times but none currently.  She denies any pain with urination, increased urgency or hematuria recently.  Patient has had constipation recently, treated with laxatives.  Last bowel movement was this morning.  No chest pain or abdominal pain. The onset of this condition was acute. The course is constant. Aggravating factors: none. Alleviating factors: none.        Past Medical History:  Diagnosis Date  . Acute renal insufficiency    due to amphotericin 2012  . Allergic rhinitis, cause unspecified   . Bronchitis, not specified as acute or chronic   . Diverticulosis   . 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 Hypertension   . Irritable bowel   . Other chronic allergic conjunctivitis   . Other chronic sinusitis   . Stroke [facility])     Patient Active Problem List   Diagnosis Date Noted  . Expressive aphasia 11/03/2015  . Labile blood pressure   . Somnolence   . Left Basal ganglia hemorrhage ([place]) 08/11/2015  . Hypertensive emergency 08/11/2015  . Incontinence 08/11/2015  . Hypokalemia 08/11/2015  . Protein-calorie malnutrition ([place]) 08/11/2015  . Spastic hemiplegia, dominant side ([place])   . Aphasia, post-stroke   . Dysphagia, post-stroke   . Dysphasia, post-stroke   . Hemiplegia, post-stroke ([place])   . Cognitive deficit, post-stroke   . Seizure prophylaxis   . Leukocytosis   . Benign essential HTN   . HLD (hyperlipidemia)   . Lethargy   . Altered mental status   . 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 Brain herniation ([place]) 08/05/2015  . ICH  (intracerebral hemorrhage) ([place]) 08/01/2015  . Hepatitis 08/12/2010  . Renal failure 08/12/2010  . 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 CONJUNCTIVITIS, ALLERGIC 09/02/2008  . 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 ALLERGIC RHINITIS 09/02/2008  . BRONCHITIS 09/02/2008    Past Surgical History:  Procedure Laterality Date  . APPENDECTOMY    . BREAST LUMPECTOMY     Right-benign  . BRONCHOSCOPY     Video bronch w/ TBBX 2012  . CESAREAN SECTION     x 4  . TONSILLECTOMY    . VESICOVAGINAL FISTULA CLOSURE W/ TAH      OB History    No data available       Home Medications    Prior to Admission medications   Medication Sig Start Date End Date Taking? Authorizing Provider  acetaminophen (TYLENOL) 325 MG tablet Take 650 mg by mouth every 6 (six) hours as needed.    [provider]  amlodipine-benazepril (LOTREL) 2.5-10 MG capsule Take 1 capsule by mouth at bedtime.     [provider]  atorvastatin (LIPITOR) 20 MG tablet Take 20 mg by mouth at bedtime.  05/07/15   [provider]  baclofen (LIORESAL) 10 MG tablet Take 10 mg by mouth 3 (three) times daily.    [provider]  Cholecalciferol (VITAMIN D) 2000 units tablet Take by mouth daily.  03/02/11   [provider]  diphenhydramine-acetaminophen (TYLENOL PM) 25-500 MG TABS tablet Take 1 tablet  by mouth at bedtime as needed.    [provider]  fexofenadine (ALLEGRA) 180 MG tablet Take 180 mg by mouth daily.    [provider]  losartan (COZAAR) 100 MG tablet Take 100 mg by mouth every morning.  05/07/15   [provider]  Probiotic Product (ALIGN) 4 MG CAPS Take 4 mg by mouth daily.     [provider]  sennosides-docusate sodium (SENOKOT-S) 8.6-50 MG tablet Take 1 tablet by mouth daily.    [provider]    Family History Family History  Problem Relation Age of Onset  . Aortic aneurysm Father   . Heart failure Mother     Social  History Social History   Tobacco Use  . Smoking status: Never Smoker  . Smokeless tobacco: Never Used  Substance Use Topics  . Alcohol use: Yes    Alcohol/week: 0.6 oz    Types: 1 Glasses of wine per week    Comment: occasionally  . Drug use: No     Allergies   Penicillins   Review of Systems Review of Systems  Constitutional: Negative for fever.  HENT: Negative for rhinorrhea and sore throat.   Eyes: Negative for redness.  Respiratory: Negative for cough.   Cardiovascular: Negative for chest pain.  Gastrointestinal: Positive for abdominal pain. Negative for diarrhea, nausea and vomiting.  Genitourinary: Positive for difficulty urinating. Negative for dysuria, frequency, vaginal bleeding and vaginal discharge.  Musculoskeletal: Negative for myalgias.  Skin: Negative for rash.  Neurological: Negative for headaches.     Physical Exam Updated Vital Signs BP (!) 144/68 (BP Location: Left Arm)   Pulse 72   Temp 98.4 F (36.9 C) (Oral)   Resp 20   Ht 5\' 5"  (1.651 m)   Wt 47.6 kg (105 lb)   SpO2 100%   BMI 17.47 kg/m   Physical Exam  Constitutional: She appears well-developed and well-nourished.  HENT:  Head: Normocephalic and atraumatic.  Eyes: Conjunctivae are normal. Right eye exhibits no discharge. Left eye exhibits no discharge.  Neck: Normal range of motion. Neck supple.  Cardiovascular: Normal rate, regular rhythm and normal heart sounds.  Pulmonary/Chest: Effort normal and breath sounds normal.  Abdominal: Soft. There is no tenderness. There is no rebound and no guarding.  Neurological: She is alert.  Skin: Skin is warm and dry.  Psychiatric: She has a normal mood and affect.  Nursing note and vitals reviewed.    ED Treatments / Results  Labs (all labs ordered are listed, but only abnormal results are displayed) Labs Reviewed  URINALYSIS, ROUTINE W REFLEX MICROSCOPIC - Abnormal; Notable for the following components:      Result Value   APPearance  CLOUDY (*)    Ketones, ur 15 (*)    All other components within normal limits  CBC WITH DIFFERENTIAL/PLATELET - Abnormal; Notable for the following components:   Neutro Abs 8.0 (*)    All other components within normal limits  BASIC METABOLIC PANEL    EKG  EKG Interpretation None       Radiology No results found.  Procedures Procedures (including critical care time)  Medications Ordered in ED Medications - No data to display   Initial Impression / Assessment and Plan / ED Course  I have reviewed the triage vital signs and the nursing notes.  Pertinent labs & imaging results that were available during my care of the patient were reviewed by me and considered in my medical decision making (see chart for details).  Patient seen and examined. Work-up initiated. Bladder scan < 200.  Patient discussed with and seen by Dr. Leonette Monarch.  Vital signs reviewed and are as follows: BP 139/78 (BP Location: Right Arm)   Pulse 70   Temp 98.4 F (36.9 C) (Oral)   Resp 18   Ht 5\' 5"  (1.651 m)   Wt 47.6 kg (105 lb)   SpO2 100%   BMI 17.47 kg/m   UA obtained without signs of infection.  Normal kidney function noted.  Patient was able to drink fluids here and successfully urinated.  Do not suspect significant degree of urinary obstruction at this point.  Patient and husband are comfortable with follow-up with PCP.  They are due to follow-up in 2 days for routine labs.  They were given a copy of labs that were performed today.  Encouraged return with worsening or changing symptoms.  They verbalized understanding and agree with plan.  Final Clinical Impressions(s) / ED Diagnoses   Final diagnoses:  Decreased urination   Patient with decreased urination today, concern for urinary retention.  Patient without a large amount of bladder noted on bladder scan.  No signs of infection or renal dysfunction.  She was able to urinate while in the emergency department prior to discharge.   Patient's chronic medical issues are at her baseline.  Comfortable discharged home in stomach PCP follow-up as above.  She denies any abdominal pains, fever, or other systemic symptoms of illness.  ED Discharge Orders    None       Carlisle Cater, [place] 06/04/17 5400

## 2017-06-04 NOTE — Discharge Instructions (Signed)
Please read and follow all provided instructions.  Your diagnoses today include:  1. Decreased urination     Tests performed today include:  Blood counts and electrolytes  Kidney function -normal  Urine test -infection  Vital signs. See below for your results today.   Medications prescribed:   None  Take any prescribed medications only as directed.  Home care instructions:  Follow any educational materials contained in this packet.  BE VERY CAREFUL not to take multiple medicines containing Tylenol (also called acetaminophen). Doing so can lead to an overdose which can damage your liver and cause liver failure and possibly death.   Follow-up instructions: Please follow-up with your primary care provider in the next 2 days for further evaluation of your symptoms.   Return instructions:   Please return to the Emergency Department if you experience worsening symptoms.   Please return if you have any other emergent concerns.  Additional Information:  Your vital signs today were: BP 139/78 (BP Location: Right Arm)    Pulse 70    Temp 98.4 F (36.9 C) (Oral)    Resp 18    Ht 5\' 5"  (1.651 m)    Wt 47.6 kg (105 lb)    SpO2 100%    BMI 17.47 kg/m  If your blood pressure (BP) was elevated above 135/85 this visit, please have this repeated by your doctor within one month. --------------

## 2017-06-06 DIAGNOSIS — R946 Abnormal results of thyroid function studies: Secondary | ICD-10-CM | POA: Diagnosis not present

## 2017-06-06 DIAGNOSIS — E559 Vitamin D deficiency, unspecified: Secondary | ICD-10-CM | POA: Diagnosis not present

## 2017-06-06 DIAGNOSIS — I10 Essential (primary) hypertension: Secondary | ICD-10-CM | POA: Diagnosis not present

## 2017-06-06 DIAGNOSIS — E7849 Other hyperlipidemia: Secondary | ICD-10-CM | POA: Diagnosis not present

## 2017-06-13 DIAGNOSIS — Z1389 Encounter for screening for other disorder: Secondary | ICD-10-CM | POA: Diagnosis not present

## 2017-06-13 DIAGNOSIS — E559 Vitamin D deficiency, unspecified: Secondary | ICD-10-CM | POA: Diagnosis not present

## 2017-06-13 DIAGNOSIS — I350 Nonrheumatic aortic (valve) stenosis: Secondary | ICD-10-CM | POA: Diagnosis not present

## 2017-06-13 DIAGNOSIS — I6389 Other cerebral infarction: Secondary | ICD-10-CM | POA: Diagnosis not present

## 2017-06-13 DIAGNOSIS — Z0000 Encounter for general adult medical examination without abnormal findings: Secondary | ICD-10-CM | POA: Diagnosis not present

## 2017-06-13 DIAGNOSIS — I10 Essential (primary) hypertension: Secondary | ICD-10-CM | POA: Diagnosis not present

## 2017-06-13 DIAGNOSIS — E7849 Other hyperlipidemia: Secondary | ICD-10-CM | POA: Diagnosis not present

## 2017-06-13 DIAGNOSIS — I69359 Hemiplegia and hemiparesis following cerebral infarction affecting unspecified side: Secondary | ICD-10-CM | POA: Diagnosis not present

## 2017-06-13 DIAGNOSIS — B009 Herpesviral infection, unspecified: Secondary | ICD-10-CM | POA: Diagnosis not present

## 2017-06-13 DIAGNOSIS — N179 Acute kidney failure, unspecified: Secondary | ICD-10-CM | POA: Diagnosis not present

## 2017-06-13 DIAGNOSIS — Z681 Body mass index (BMI) 19 or less, adult: Secondary | ICD-10-CM | POA: Diagnosis not present

## 2017-06-13 DIAGNOSIS — G8110 Spastic hemiplegia affecting unspecified side: Secondary | ICD-10-CM | POA: Diagnosis not present

## 2017-06-28 ENCOUNTER — Encounter: Payer: Self-pay | Admitting: Neurology

## 2017-06-28 ENCOUNTER — Ambulatory Visit (INDEPENDENT_AMBULATORY_CARE_PROVIDER_SITE_OTHER): Payer: Medicare Other | Admitting: Neurology

## 2017-06-28 VITALS — BP 133/82 | HR 70 | Wt 110.0 lb

## 2017-06-28 DIAGNOSIS — G8110 Spastic hemiplegia affecting unspecified side: Secondary | ICD-10-CM | POA: Diagnosis not present

## 2017-06-28 MED ORDER — TIZANIDINE HCL 2 MG PO CAPS
2.0000 mg | ORAL_CAPSULE | Freq: Every day | ORAL | 1 refills | Status: DC
Start: 2017-06-28 — End: 2017-11-14

## 2017-06-28 NOTE — Patient Instructions (Addendum)
I had a long d/w patient and her husband Dr London Pepper about her remote hemorrhagic stroke and post stroke spasticity risk for recurrent stroke/TIAs, personally independently reviewed imaging studies and stroke evaluation results and answered questions.Continue  to maintain strict control of hypertension with blood pressure goal below 130/90, diabetes with hemoglobin A1c goal below 6.5% and lipids with LDL cholesterol goal below 70 mg/dL.  discontinue nighttime dose of baclofen and start Zanaflex 2 mg at nighttime only daily for post stroke spasticity and spasms. If Zanaflex is more effective than baclofen we will increase it and taper  Baclofen gradually as tolerated. Refer for outpatient physical and occupational therapy. Follow-up with Dr. Tessa Lerner for Botox for spasticity. Return for follow-up in a year or call earlier if necessary

## 2017-06-28 NOTE — Progress Notes (Signed)
[facility] [address] [place]. [place] [address] [phone]       OFFICE FOLLOW-UP NOTE  Robin. [name] Date of Birth:  [birth year] Medical Record Number:  [id number]   HPI: [name] is a 80 year pleasant Caucasian lady seen today for first office follow-up visit for hospital admission for intracerebral hemorrhage in April 2017. [name] is an 77 y.o. female with a history of hypertension, brought to the emergency room in Code stroke status following acute onset of right-sided weakness and speech difficulty. She was last known well at 5:20 PM today. She has no previous history of stroke or TIA. CT scan of her head with a large left basal ganglia hemorrhage with no extension into left lateral ventricle. Blood pressure was 168/89. Patient was unable to speak and was neglecting her right side, with a gaze to the left. She had no voluntary movement of right extremities. NIH stroke score was 25. LSN: 5:20 PM on 08/01/2015 tPA Given: No: Acute ICH  she was admitted to intensive care unit and blood pressure was tightly controlled. MRI scan of the brain done on 08/02/15 showed increased size of the intraparenchymal hemorrhage centered around left basal ganglia now measuring 5.6 x 3.3 x 3.6 cm for an estimated volume of 33 cubic cc. No associated left to right midline shift. She was closely neurologically monitored and remained stable with follow-up serial imaging showing stable appearance. She remained aphasic with dense right hemiplegia on exam. She was seen by physical occupational therapy as well as rehabilitation medicine for consult and was transferred to inpatient rehabilitation. She had urinary tract infection as well as some in the [place] abnormalities that but gradually made improvement. She was transferred to pain even for rehabilitation but is currently they're in the skilled facility. She is still getting physical occupational speech therapy daily but she has  unfortunately not been significant improvement. She is able to speak short words and sentences but her comprehension has improved but language and hasn't not improved so much. She still has no significant strength on the right side and has dense hemiplegia. She is able to stand with Lexapro and can help with transfers but is not able to walk a whole lot. She has able to swallow better and hyperactive as come out but she did husband feels she does not eat enough. She had some spasms in her right leg recently and saw Dr. Tessa Lerner who added gabapentin which seems to be helping. The patient's husband plans to move into an apartment in [place] and wants his wife to move up there soon. Blood pressure has remained in the [id number] systolic range and could do better. She is getting baclofen 10 mg 3 times daily for spasticity. Update 05/03/2016 : She returns for follow-up after last visit with me 6 months ago. She is accompanied by husband Dr. London Pepper. They have now moved into the independent living apartment. Patient has 24-hour caregiver during the day. She is not able to walk with a hemiwalker with the need and a foot brace. She requires close supervision however. There have been no falls or injuries. She has noticed some benefit in her muscle cramps and spasms after addition of baclofen to gabapentin. She is slowly tolerating increasing doses of baclofen and currently takes 20 mg and 10 mg alternately a total of 4 times a day. She is also gotten one dose of Botox injection in the right upper extremity by Dr. Tessa Lerner but they have not been  satisfied with the benefit yet. Her blood pressure has been well controlled and today it is 140/82. She had an episode a few weeks ago when she was working on an exercise bicycle at home with her aide when she couldn't breathe and felt right upper extremity was heavy. This did not last long. She was fully aware of her surroundings and did not have any seizure-like activity. Patient did  have an abnormal EEG when she was hospitalized for intracerebral hemorrhage and the family wonders whether this could be seizure related. She is on gabapentin but on a low-dose of 100 mg 4 times daily. She continues to have expressive language difficulties but is able to communicate with some inference and there has been a slow improvement she still has significant right hemiplegia with spasticity but is now able to walk with a hemiwalker with the knee and ankle brace with some help. Update 06/28/2017 : She returns for follow-up after last visit 1 year ago. She is accompanied by her husband. Patient continues to live at [place] independent living apartment. He continues to have difficulty with speech and walking though her speech may be slightly better. She can speak short sentences at times. She continues to receive this port injections into her right arm but she and her husband feel that Botox was working better. She is currently on baclofen which is being tapered off. Patient has not tried Zanaflex yet for spasticity. She wears a right arm and foot brace and walks with the hemiwalker. She has not had any falls or injuries. She was seen in the ER a few weeks ago with urinary retention. Remains on Lipitor which is tolerating well without any side effects. Her blood pressure is well controlled.  ROS:   14 system review of systems is positive for constipation, murmur, decreased concentration, passing out, facial drooping, speech difficulty, weakness, numbness, joint pain, urinary retention ait difficulty and all other systems negative  PMH:  Past Medical History:  Diagnosis Date  . Acute renal insufficiency    due to amphotericin 2012  . Allergic rhinitis, cause unspecified   . Bronchitis, not specified as acute or chronic   . Diverticulosis   . 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 Hypertension   . Irritable bowel   . Other chronic allergic conjunctivitis   . Other chronic sinusitis    . Stroke [facility])     Social History:  Social History   Socioeconomic History  . Marital status: Married    Spouse name: Dr. Genelle Bal  . Number of children: Not on file  . Years of education: Not on file  . Highest education level: Not on file  Social Needs  . Financial resource strain: Not on file  . Food insecurity - worry: Not on file  . Food insecurity - inability: Not on file  . Transportation needs - medical: Not on file  . Transportation needs - non-medical: Not on file  Occupational History  . Not on file  Tobacco Use  . Smoking status: Never Smoker  . Smokeless tobacco: Never Used  Substance and Sexual Activity  . Alcohol use: Yes    Alcohol/week: 0.6 oz    Types: 1 Glasses of wine per week    Comment: occasionally  . Drug use: No  . Sexual activity: Not on file  Other Topics Concern  . Not on file  Social History Narrative  . Not on file    Medications:   Current Outpatient Medications  on File Prior to Visit  Medication Sig Dispense Refill  . acetaminophen (TYLENOL) 325 MG tablet Take 650 mg by mouth every 6 (six) hours as needed.    Marland Kitchen amLODipine (NORVASC) 2.5 MG tablet     . amlodipine-benazepril (LOTREL) 2.5-10 MG capsule Take 1 capsule by mouth at bedtime.     Marland Kitchen atorvastatin (LIPITOR) 20 MG tablet Take 20 mg by mouth at bedtime.   3  . baclofen (LIORESAL) 10 MG tablet Take 10 mg by mouth 3 (three) times daily.    Marland Kitchen BIOTIN 5000 PO Take by mouth.    . Calcium Polycarbophil (FIBER-CAPS PO) Take by mouth.    . Cholecalciferol (VITAMIN D) 2000 units tablet Take by mouth daily.     . diphenhydramine-acetaminophen (TYLENOL PM) 25-500 MG TABS tablet Take 1 tablet by mouth at bedtime as needed.    . fexofenadine (ALLEGRA) 180 MG tablet Take 180 mg by mouth daily.    Marland Kitchen lidocaine (LIDODERM) 5 % APPLY 1-3 PATCHES TO PAINFUL AREA FOR 12 HOURS prn  1  . losartan (COZAAR) 100 MG tablet Take 100 mg by mouth every morning.   3  . sennosides-docusate sodium  (SENOKOT-S) 8.6-50 MG tablet Take 1 tablet by mouth daily.     No current facility-administered medications on file prior to visit.     Allergies:   Allergies  Allergen Reactions  . Penicillins Hives and Rash    Has patient had a PCN reaction causing immediate rash, facial/tongue/throat swelling, SOB or lightheadedness with hypotension: Yes Has patient had a PCN reaction causing severe rash involving mucus membranes or skin necrosis: No Has patient had a PCN reaction that required hospitalization unknown Has patient had a PCN reaction occurring within the last 10 years: No If all of the above answers are "NO", then may proceed with Cephalosporin use.    Physical Exam General: Frail elderly Caucasian lady seated, in no evident distress Head: head normocephalic and atraumatic.  Neck: supple with no carotid or supraclavicular bruits Cardiovascular: regular rate and rhythm, soft ejection systolicmurmur Musculoskeletal: no deformity Skin:  no rash/petichiae Vascular:  Normal pulses all extremities Vitals:   06/28/17 1610  BP: 133/82  Pulse: 70   Neurologic Exam Mental Status: Awake and fully alert. Moderate to severe expressive aphasia and can speak only short sentences with word hesitancy and dysfluency. Able to name and comprehend much better.. Mood and affect appropriate.  Cranial Nerves: Fundoscopic exam not done. Pupils equal, briskly reactive to light. Extraocular movements full without nystagmus. Visual fields Decreased blink to threat on the right to confrontation. Hearing intact. Facial sensation intact. Moderate right lower Face weakness.. Tongue, palate moves normally and symmetrically.  Motor: Normal bulk and tone. Normal strength in all tested extremity muscles on the left side but dense right hemiplegia with 0/5 strength. Spasticity in the right leg with increased tone  .Rt foot drop and right hand nonfixed flexion contractures with atrophy. The right foot brace and right arm  brace. Sensory.: intact to touch ,pinprick .position and vibratory sensation.  Coordination: Rapid alternating movements normal in left extremities. And cannot test on the right side  Gait and Station: Not tested as patient unable to walk without hemiwalker Reflexes: 2+ and asymmetric brisker on the right. Toes downgoing.     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  discontinue nighttime dose of baclofen and start Zanaflex 2 mg at nighttime only daily for post stroke spasticity and spasms. If Zanaflex is more effective than baclofen we will increase it and taper  Baclofen gradually as tolerated. Refer for outpatient physical and occupational therapy. Follow-up with Dr. Tessa Lerner for dysport for spasticity. Return for follow-up in a year or call earlier if necessary Greater than 50% of time during this 25 minute visit was spent on counseling,explanation of diagnosis of aphasia and post stroke spasticity, planning of further management, discussion with patient and family and coordination of care Antony Contras, MD Medical Director [place] Pager: [phone] 06/28/2017 5:02 PM  Note: This document was prepared with digital dictation and possible smart phrase technology. Any transcriptional errors that result from this process are unintentional

## 2017-07-12 ENCOUNTER — Encounter: Payer: Self-pay | Admitting: Physical Medicine & Rehabilitation

## 2017-07-12 ENCOUNTER — Encounter: Payer: Medicare Other | Attending: Physical Medicine & Rehabilitation | Admitting: Physical Medicine & Rehabilitation

## 2017-07-12 VITALS — BP 193/89 | HR 65 | Wt 110.0 lb

## 2017-07-12 DIAGNOSIS — I6931 Attention and concentration deficit following cerebral infarction: Secondary | ICD-10-CM | POA: Diagnosis not present

## 2017-07-12 DIAGNOSIS — R531 Weakness: Secondary | ICD-10-CM | POA: Diagnosis not present

## 2017-07-12 DIAGNOSIS — I69251 Hemiplegia and hemiparesis following other nontraumatic intracranial hemorrhage affecting right dominant side: Secondary | ICD-10-CM | POA: Diagnosis not present

## 2017-07-12 DIAGNOSIS — F329 Major depressive disorder, single episode, unspecified: Secondary | ICD-10-CM | POA: Insufficient documentation

## 2017-07-12 DIAGNOSIS — R5383 Other fatigue: Secondary | ICD-10-CM | POA: Insufficient documentation

## 2017-07-12 DIAGNOSIS — I69320 Aphasia following cerebral infarction: Secondary | ICD-10-CM

## 2017-07-12 DIAGNOSIS — G8111 Spastic hemiplegia affecting right dominant side: Secondary | ICD-10-CM | POA: Diagnosis not present

## 2017-07-12 DIAGNOSIS — R252 Cramp and spasm: Secondary | ICD-10-CM | POA: Insufficient documentation

## 2017-07-12 DIAGNOSIS — Z5189 Encounter for other specified aftercare: Secondary | ICD-10-CM | POA: Insufficient documentation

## 2017-07-12 DIAGNOSIS — G8110 Spastic hemiplegia affecting unspecified side: Secondary | ICD-10-CM

## 2017-07-12 DIAGNOSIS — E785 Hyperlipidemia, unspecified: Secondary | ICD-10-CM | POA: Diagnosis not present

## 2017-07-12 DIAGNOSIS — R278 Other lack of coordination: Secondary | ICD-10-CM | POA: Diagnosis not present

## 2017-07-12 DIAGNOSIS — R200 Anesthesia of skin: Secondary | ICD-10-CM | POA: Diagnosis not present

## 2017-07-12 DIAGNOSIS — M63831 Disorders of muscle in diseases classified elsewhere, right forearm: Secondary | ICD-10-CM | POA: Diagnosis not present

## 2017-07-12 DIAGNOSIS — K589 Irritable bowel syndrome without diarrhea: Secondary | ICD-10-CM | POA: Insufficient documentation

## 2017-07-12 DIAGNOSIS — I10 Essential (primary) hypertension: Secondary | ICD-10-CM | POA: Insufficient documentation

## 2017-07-12 DIAGNOSIS — I69191 Dysphagia following nontraumatic intracerebral hemorrhage: Secondary | ICD-10-CM | POA: Insufficient documentation

## 2017-07-12 DIAGNOSIS — M63821 Disorders of muscle in diseases classified elsewhere, right upper arm: Secondary | ICD-10-CM | POA: Diagnosis not present

## 2017-07-12 NOTE — Patient Instructions (Addendum)
CHECK FOR WAX IN [place].   CONSIDER AUDIOLOGY EVAL   PLEASE FEEL FREE TO CALL OUR OFFICE WITH ANY PROBLEMS OR QUESTIONS ([phone])     [employer] [place]  5.02 Google reviews   Speech pathologist in [place], [place]  Address: [address] [place],  [address]  Phone: [phone]

## 2017-07-12 NOTE — Progress Notes (Signed)
Subjective:    Patient ID: [name], female    DOB: 22-Jun-1940, 77 y.o.   MRN: [id number]  HPI Mrs. Pineda is here in follow-up of her spastic right hemiparesis and aphasia.  We performed a Dysport injections about 2 months ago and she really did not experience a lot of benefit, particularly at the elbow and shoulder.  He saw Dr.Sethi last week and he recommended starting some Zanaflex which they have initiated in the evenings.  This is helped her sleep and perhaps somewhat with pain.  Is continue with the baclofen at current dosing.  Patient reports some intermittent loss of hearing and asked what kind of effect that could be.  Husband was not aware of this problem.   Pain Inventory Average Pain 4 Pain Right Now 1 My pain is intermittent, burning, dull and aching  In the last 24 hours, has pain interfered with the following? General activity 4 Relation with others 4 Enjoyment of life 4 What TIME of day is your pain at its worst? night Sleep (in general) Fair  Pain is worse with: inactivity Pain improves with: heat/ice and medication Relief from Meds: 6  Mobility walk with assistance use a walker how many minutes can you walk? 20 ability to climb steps?  yes do you drive?  no Do you have any goals in this area?  yes  Function retired I need assistance with the following:  dressing, bathing, toileting, meal prep, household duties and shopping  Neuro/Psych numbness trouble walking spasms loss of taste or smell  Prior Studies Any changes since last visit?  no  Physicians involved in your care Any changes since last visit?  no   Family History  Problem Relation Age of Onset  . Aortic aneurysm Father   . Heart failure Mother    Social History   Socioeconomic History  . Marital status: Married    Spouse name: Dr. Genelle Bal  . Number of children: Not on file  . Years of education: Not on file  . Highest education level: Not on file  Occupational  History  . Not on file  Social Needs  . Financial resource strain: Not on file  . Food insecurity:    Worry: Not on file    Inability: Not on file  . Transportation needs:    Medical: Not on file    Non-medical: Not on file  Tobacco Use  . Smoking status: Never Smoker  . Smokeless tobacco: Never Used  Substance and Sexual Activity  . Alcohol use: Yes    Alcohol/week: 0.6 oz    Types: 1 Glasses of wine per week    Comment: occasionally  . Drug use: No  . Sexual activity: Not on file  Lifestyle  . Physical activity:    Days per week: Not on file    Minutes per session: Not on file  . Stress: Not on file  Relationships  . Social connections:    Talks on phone: Not on file    Gets together: Not on file    Attends religious service: Not on file    Active member of club or organization: Not on file    Attends meetings of clubs or organizations: Not on file    Relationship status: Not on file  Other Topics Concern  . Not on file  Social History Narrative  . Not on file   Past Surgical History:  Procedure Laterality Date  . APPENDECTOMY    . BREAST LUMPECTOMY  Right-benign  . BRONCHOSCOPY     Video bronch w/ TBBX 2012  . CESAREAN SECTION     x 4  . TONSILLECTOMY    . VESICOVAGINAL FISTULA CLOSURE W/ TAH     Past Medical History:  Diagnosis Date  . Acute renal insufficiency    due to amphotericin 2012  . Allergic rhinitis, cause unspecified   . Bronchitis, not specified as acute or chronic   . Diverticulosis   . 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 Hypertension   . Irritable bowel   . Other chronic allergic conjunctivitis   . Other chronic sinusitis   . Stroke ([place])    BP (!) 193/89 (BP Location: Left Arm, Patient Position: Sitting, Cuff Size: Normal)   Pulse 65   SpO2 96%   Opioid Risk Score:   Fall Risk Score:  `1  Depression screen PHQ 2/9  Depression screen PHQ 2/9 10/06/2015  Decreased Interest 0  Down, Depressed, Hopeless 1    PHQ - 2 Score 1  Altered sleeping 3  Tired, decreased energy 0  Change in appetite 0  Feeling bad or failure about yourself  1  Trouble concentrating 2  Moving slowly or fidgety/restless 3  Suicidal thoughts 0  PHQ-9 Score 10  Difficult doing work/chores Very difficult    Review of Systems  Constitutional: Negative.   HENT: Negative.   Eyes: Negative.   Respiratory: Negative.   Cardiovascular: Negative.   Gastrointestinal: Negative.   Endocrine: Negative.   Genitourinary: Negative.   Musculoskeletal: Positive for gait problem.       Spasms   Allergic/Immunologic: Negative.   Neurological: Positive for numbness.  Psychiatric/Behavioral: Negative.        Objective:   Physical Exam  General: No acute distress HEENT: EOMI, oral membranes moist Cards: reg rate  Chest: normal effort Abdomen: Soft, NT, ND Skin: dry, intact Extremities: no edema  Musc:minorsublux anterior right shoulder Neurological: improving language Alert. DTRs 3+ right upper and right lower extremity  Motor: right central 7 Right upper extremity and right lower extremity: RUE trace to 1/5. 1/5 HAD, 1+KE , trace ADF/PF.--stable   Left upper and left lower extremity 4/5. Flexor tone RUE I spersistent--2/4 biceps and pecs, PT/PQtr to 1/4 wrist and finger flexors/thumb,  . trace extensor. 1/4RLE tib posterior/gastrocs. Skin: Skin is warm and dry.  Psych: pleasant and up beat.    Assessment & Plan:  Medical Problem List and Plan: 1. Right hemiplegia, aphasia, dysphagia secondary to left basal ganglia hypertensive intracranial hemorrhage -continue HEP for language/speech -[name] is an option too for this Fall 3. Central neuropathic pain: - 4. Spasticity:  -baclofen, continue zanaflex per neuro, can try AM zanaflex if tolerated -right knee brace, AFO, dyna splint -resting WFO  -has done quite  well with botulinum toxin injections -Will bring back for botox injections 400u biceps, pecs RIGHT -consider return to outpt therapy this winter 5. Neuropsych: mood has been up beat. Off all meds currently 6. Hypertension. Cozaar 100 mg daily. Some elevation again today.  -monitor regularly at home. No changes in regeimn  .  50 minutes of patient time was spent today in the office.  I will see her back in 1 month for Botox injections

## 2017-07-14 DIAGNOSIS — M63831 Disorders of muscle in diseases classified elsewhere, right forearm: Secondary | ICD-10-CM | POA: Diagnosis not present

## 2017-07-14 DIAGNOSIS — R278 Other lack of coordination: Secondary | ICD-10-CM | POA: Diagnosis not present

## 2017-07-14 DIAGNOSIS — I6931 Attention and concentration deficit following cerebral infarction: Secondary | ICD-10-CM | POA: Diagnosis not present

## 2017-07-14 DIAGNOSIS — G8111 Spastic hemiplegia affecting right dominant side: Secondary | ICD-10-CM | POA: Diagnosis not present

## 2017-07-14 DIAGNOSIS — M63821 Disorders of muscle in diseases classified elsewhere, right upper arm: Secondary | ICD-10-CM | POA: Diagnosis not present

## 2017-07-15 DIAGNOSIS — M63831 Disorders of muscle in diseases classified elsewhere, right forearm: Secondary | ICD-10-CM | POA: Diagnosis not present

## 2017-07-15 DIAGNOSIS — G8111 Spastic hemiplegia affecting right dominant side: Secondary | ICD-10-CM | POA: Diagnosis not present

## 2017-07-15 DIAGNOSIS — R278 Other lack of coordination: Secondary | ICD-10-CM | POA: Diagnosis not present

## 2017-07-15 DIAGNOSIS — I6931 Attention and concentration deficit following cerebral infarction: Secondary | ICD-10-CM | POA: Diagnosis not present

## 2017-07-15 DIAGNOSIS — M63821 Disorders of muscle in diseases classified elsewhere, right upper arm: Secondary | ICD-10-CM | POA: Diagnosis not present

## 2017-07-20 DIAGNOSIS — M63831 Disorders of muscle in diseases classified elsewhere, right forearm: Secondary | ICD-10-CM | POA: Diagnosis not present

## 2017-07-20 DIAGNOSIS — G8111 Spastic hemiplegia affecting right dominant side: Secondary | ICD-10-CM | POA: Diagnosis not present

## 2017-07-20 DIAGNOSIS — I6931 Attention and concentration deficit following cerebral infarction: Secondary | ICD-10-CM | POA: Diagnosis not present

## 2017-07-20 DIAGNOSIS — M63821 Disorders of muscle in diseases classified elsewhere, right upper arm: Secondary | ICD-10-CM | POA: Diagnosis not present

## 2017-07-20 DIAGNOSIS — R278 Other lack of coordination: Secondary | ICD-10-CM | POA: Diagnosis not present

## 2017-07-21 DIAGNOSIS — M63821 Disorders of muscle in diseases classified elsewhere, right upper arm: Secondary | ICD-10-CM | POA: Diagnosis not present

## 2017-07-21 DIAGNOSIS — I6931 Attention and concentration deficit following cerebral infarction: Secondary | ICD-10-CM | POA: Diagnosis not present

## 2017-07-21 DIAGNOSIS — G8111 Spastic hemiplegia affecting right dominant side: Secondary | ICD-10-CM | POA: Diagnosis not present

## 2017-07-21 DIAGNOSIS — R278 Other lack of coordination: Secondary | ICD-10-CM | POA: Diagnosis not present

## 2017-07-21 DIAGNOSIS — M63831 Disorders of muscle in diseases classified elsewhere, right forearm: Secondary | ICD-10-CM | POA: Diagnosis not present

## 2017-07-22 DIAGNOSIS — M63831 Disorders of muscle in diseases classified elsewhere, right forearm: Secondary | ICD-10-CM | POA: Diagnosis not present

## 2017-07-22 DIAGNOSIS — R278 Other lack of coordination: Secondary | ICD-10-CM | POA: Diagnosis not present

## 2017-07-22 DIAGNOSIS — G8111 Spastic hemiplegia affecting right dominant side: Secondary | ICD-10-CM | POA: Diagnosis not present

## 2017-07-22 DIAGNOSIS — I6931 Attention and concentration deficit following cerebral infarction: Secondary | ICD-10-CM | POA: Diagnosis not present

## 2017-07-22 DIAGNOSIS — M63821 Disorders of muscle in diseases classified elsewhere, right upper arm: Secondary | ICD-10-CM | POA: Diagnosis not present

## 2017-08-09 ENCOUNTER — Ambulatory Visit: Payer: Medicare Other | Admitting: Physical Medicine & Rehabilitation

## 2017-08-15 ENCOUNTER — Encounter: Payer: Medicare Other | Attending: Physical Medicine & Rehabilitation | Admitting: Physical Medicine & Rehabilitation

## 2017-08-15 ENCOUNTER — Encounter: Payer: Self-pay | Admitting: Physical Medicine & Rehabilitation

## 2017-08-15 VITALS — BP 159/83 | HR 66 | Ht 65.0 in | Wt 108.0 lb

## 2017-08-15 DIAGNOSIS — R5383 Other fatigue: Secondary | ICD-10-CM | POA: Insufficient documentation

## 2017-08-15 DIAGNOSIS — R531 Weakness: Secondary | ICD-10-CM | POA: Insufficient documentation

## 2017-08-15 DIAGNOSIS — K589 Irritable bowel syndrome without diarrhea: Secondary | ICD-10-CM | POA: Diagnosis not present

## 2017-08-15 DIAGNOSIS — Z5189 Encounter for other specified aftercare: Secondary | ICD-10-CM | POA: Diagnosis not present

## 2017-08-15 DIAGNOSIS — I69191 Dysphagia following nontraumatic intracerebral hemorrhage: Secondary | ICD-10-CM | POA: Diagnosis not present

## 2017-08-15 DIAGNOSIS — F329 Major depressive disorder, single episode, unspecified: Secondary | ICD-10-CM | POA: Diagnosis not present

## 2017-08-15 DIAGNOSIS — R200 Anesthesia of skin: Secondary | ICD-10-CM | POA: Diagnosis not present

## 2017-08-15 DIAGNOSIS — I10 Essential (primary) hypertension: Secondary | ICD-10-CM | POA: Diagnosis not present

## 2017-08-15 DIAGNOSIS — I69251 Hemiplegia and hemiparesis following other nontraumatic intracranial hemorrhage affecting right dominant side: Secondary | ICD-10-CM | POA: Insufficient documentation

## 2017-08-15 DIAGNOSIS — G8190 Hemiplegia, unspecified affecting unspecified side: Secondary | ICD-10-CM | POA: Diagnosis not present

## 2017-08-15 DIAGNOSIS — R252 Cramp and spasm: Secondary | ICD-10-CM | POA: Insufficient documentation

## 2017-08-15 DIAGNOSIS — I610 Nontraumatic intracerebral hemorrhage in hemisphere, subcortical: Secondary | ICD-10-CM

## 2017-08-15 DIAGNOSIS — E785 Hyperlipidemia, unspecified: Secondary | ICD-10-CM | POA: Diagnosis not present

## 2017-08-15 NOTE — Patient Instructions (Signed)
PLEASE FEEL FREE TO CALL OUR OFFICE WITH ANY PROBLEMS OR QUESTIONS ([phone])      

## 2017-08-15 NOTE — Progress Notes (Signed)
/  Botox Injection for spasticity using needle EMG guidance Indication: Left Basal ganglia hemorrhage (HCC)  Hemiplegia affecting dominant side (HCC)   Dilution: 100 Units/ml        Total Units Injected: 400 Indication: Severe spasticity which interferes with ADL,mobility and/or  hygiene and is unresponsive to medication management and other conservative care Informed consent was obtained after describing risks and benefits of the procedure with the patient. This includes bleeding, bruising, infection, excessive weakness, or medication side effects. A REMS form is on file and signed.  Needle: 79mm injectable monopolar needle electrode  Number of units per muscle Pectoralis Major 150 units 6 access points Pectoralis Minor 50 units w access points Biceps 175 units 4 accesspoints Brachioradialis - units FCR - units FCU - units FDS  units FDP - units FPL - units Pronator Teres 25 units Pronator Quadratus 0 units  All injections were done after obtaining appropriate EMG activity and after negative drawback for blood. The patient tolerated the procedure well. Post procedure instructions were given. Return in about 3 months (around 11/14/2017). We can perform botox again if need be at this visit. Husband will call prior to inform us on needs.

## 2017-09-05 DIAGNOSIS — H25812 Combined forms of age-related cataract, left eye: Secondary | ICD-10-CM | POA: Diagnosis not present

## 2017-09-05 DIAGNOSIS — H401213 Low-tension glaucoma, right eye, severe stage: Secondary | ICD-10-CM | POA: Diagnosis not present

## 2017-09-05 DIAGNOSIS — H401221 Low-tension glaucoma, left eye, mild stage: Secondary | ICD-10-CM | POA: Diagnosis not present

## 2017-09-05 DIAGNOSIS — H5202 Hypermetropia, left eye: Secondary | ICD-10-CM | POA: Diagnosis not present

## 2017-11-07 DIAGNOSIS — S0993XA Unspecified injury of face, initial encounter: Secondary | ICD-10-CM | POA: Diagnosis not present

## 2017-11-07 DIAGNOSIS — I10 Essential (primary) hypertension: Secondary | ICD-10-CM | POA: Diagnosis not present

## 2017-11-07 DIAGNOSIS — I69351 Hemiplegia and hemiparesis following cerebral infarction affecting right dominant side: Secondary | ICD-10-CM | POA: Diagnosis not present

## 2017-11-07 DIAGNOSIS — W19XXXA Unspecified fall, initial encounter: Secondary | ICD-10-CM | POA: Diagnosis not present

## 2017-11-07 DIAGNOSIS — S199XXA Unspecified injury of neck, initial encounter: Secondary | ICD-10-CM | POA: Diagnosis not present

## 2017-11-07 DIAGNOSIS — S0181XA Laceration without foreign body of other part of head, initial encounter: Secondary | ICD-10-CM | POA: Diagnosis not present

## 2017-11-07 DIAGNOSIS — S0990XA Unspecified injury of head, initial encounter: Secondary | ICD-10-CM | POA: Diagnosis not present

## 2017-11-09 ENCOUNTER — Telehealth: Payer: Self-pay

## 2017-11-09 NOTE — Telephone Encounter (Signed)
Pt husband [name] called requesting that sutures be removed at appt on Monday. She had sutures put in this past Monday and the doctor mention that Dr. Naaman Plummer could take them out at visit at next visit. Per [name] when can remove them.

## 2017-11-14 ENCOUNTER — Encounter: Payer: Medicare Other | Attending: Physical Medicine & Rehabilitation | Admitting: Physical Medicine & Rehabilitation

## 2017-11-14 ENCOUNTER — Encounter: Payer: Self-pay | Admitting: Physical Medicine & Rehabilitation

## 2017-11-14 ENCOUNTER — Other Ambulatory Visit: Payer: Self-pay

## 2017-11-14 VITALS — BP 108/63 | HR 76 | Ht 64.0 in | Wt 110.0 lb

## 2017-11-14 DIAGNOSIS — F419 Anxiety disorder, unspecified: Secondary | ICD-10-CM | POA: Diagnosis not present

## 2017-11-14 DIAGNOSIS — R252 Cramp and spasm: Secondary | ICD-10-CM | POA: Diagnosis not present

## 2017-11-14 DIAGNOSIS — K589 Irritable bowel syndrome without diarrhea: Secondary | ICD-10-CM | POA: Insufficient documentation

## 2017-11-14 DIAGNOSIS — R531 Weakness: Secondary | ICD-10-CM | POA: Insufficient documentation

## 2017-11-14 DIAGNOSIS — I10 Essential (primary) hypertension: Secondary | ICD-10-CM | POA: Insufficient documentation

## 2017-11-14 DIAGNOSIS — G8111 Spastic hemiplegia affecting right dominant side: Secondary | ICD-10-CM | POA: Diagnosis not present

## 2017-11-14 DIAGNOSIS — R262 Difficulty in walking, not elsewhere classified: Secondary | ICD-10-CM | POA: Insufficient documentation

## 2017-11-14 DIAGNOSIS — R200 Anesthesia of skin: Secondary | ICD-10-CM | POA: Diagnosis not present

## 2017-11-14 DIAGNOSIS — G8110 Spastic hemiplegia affecting unspecified side: Secondary | ICD-10-CM | POA: Diagnosis not present

## 2017-11-14 NOTE — Progress Notes (Signed)
Subjective:    Patient ID: [name], female    DOB: 02/26/1941, 77 y.o.   MRN: [id number]  HPI   [name] is here in follow up of her spastic right hemiparesis. We performed botox at last visit with good results. She is in need of further botox today.    Pain Inventory Average Pain 1 Pain Right Now 3 My pain is intermittent, dull and aching  In the last 24 hours, has pain interfered with the following? General activity 2 Relation with others 2 Enjoyment of life 2 What TIME of day is your pain at its worst? evening Sleep (in general) Fair  Pain is worse with: unsure Pain improves with: heat/ice, therapy/exercise, medication and injections Relief from Meds: 8  Mobility use a walker how many minutes can you walk? 20 ability to climb steps?  yes do you drive?  no Do you have any goals in this area?  yes  Function disabled: date disabled 07/2015 I need assistance with the following:  dressing, bathing, meal prep, household duties and shopping  Neuro/Psych weakness numbness trouble walking spasms anxiety loss of taste or smell  Prior Studies Any changes since last visit?  no  Physicians involved in your care Any changes since last visit?  no   Family History  Problem Relation Age of Onset  . Aortic aneurysm Father   . Heart failure Mother    Social History   Socioeconomic History  . Marital status: Married    Spouse name: Dr. Genelle Bal  . Number of children: Not on file  . Years of education: Not on file  . Highest education level: Not on file  Occupational History  . Not on file  Social Needs  . Financial resource strain: Not on file  . Food insecurity:    Worry: Not on file    Inability: Not on file  . Transportation needs:    Medical: Not on file    Non-medical: Not on file  Tobacco Use  . Smoking status: Never Smoker  . Smokeless tobacco: Never Used  Substance and Sexual Activity  . Alcohol use: Yes    Alcohol/week: 0.6 oz   Types: 1 Glasses of wine per week    Comment: occasionally  . Drug use: No  . Sexual activity: Not on file  Lifestyle  . Physical activity:    Days per week: Not on file    Minutes per session: Not on file  . Stress: Not on file  Relationships  . Social connections:    Talks on phone: Not on file    Gets together: Not on file    Attends religious service: Not on file    Active member of club or organization: Not on file    Attends meetings of clubs or organizations: Not on file    Relationship status: Not on file  Other Topics Concern  . Not on file  Social History Narrative  . Not on file   Past Surgical History:  Procedure Laterality Date  . APPENDECTOMY    . BREAST LUMPECTOMY     Right-benign  . BRONCHOSCOPY     Video bronch w/ TBBX 2012  . CESAREAN SECTION     x 4  . TONSILLECTOMY    . VESICOVAGINAL FISTULA CLOSURE W/ TAH     Past Medical History:  Diagnosis Date  . Acute renal insufficiency    due to amphotericin 2012  . Allergic rhinitis, cause unspecified   . Bronchitis, not  specified as acute or chronic   . Diverticulosis   . 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 Hypertension   . Irritable bowel   . Other chronic allergic conjunctivitis   . Other chronic sinusitis   . Stroke ([place])    BP 108/63   Pulse 76   Ht 5\' 4"  (1.626 m) Comment: pt reported, unable to get on the scale  Wt 110 lb (49.9 kg) Comment: pt reported, unable to get on scale  SpO2 96%   BMI 18.88 kg/m   Opioid Risk Score:   Fall Risk Score:  `1  Depression screen PHQ 2/9  Depression screen [facility] 2/9 11/14/2017 10/06/2015  Decreased Interest 0 0  Down, Depressed, Hopeless 0 1  PHQ - 2 Score 0 1  Altered sleeping - 3  Tired, decreased energy - 0  Change in appetite - 0  Feeling bad or failure about yourself  - 1  Trouble concentrating - 2  Moving slowly or fidgety/restless - 3  Suicidal thoughts - 0  PHQ-9 Score - 10  Difficult doing work/chores - Very difficult     Review of Systems  Constitutional: Negative.   HENT: Negative.   Eyes: Negative.   Respiratory: Negative.   Cardiovascular: Negative.   Gastrointestinal: Positive for constipation.  Endocrine: Negative.   Genitourinary: Negative.   Musculoskeletal: Negative.   Skin: Negative.   Allergic/Immunologic: Negative.   Neurological: Negative.   Hematological: Negative.   Psychiatric/Behavioral: Negative.   All other systems reviewed and are negative.      Objective:   Physical Exam        Assessment & Plan:  Botox Injection for spasticity using needle EMG guidance Indication: Spastic hemiplegia, dominant side (HCC)   Dilution: 100 Units/ml        Total Units Injected: 400 Indication: Severe spasticity which interferes with ADL,mobility and/or  hygiene and is unresponsive to medication management and other conservative care Informed consent was obtained after describing risks and benefits of the procedure with the patient. This includes bleeding, bruising, infection, excessive weakness, or medication side effects. A REMS form is on file and signed.  Needle: 55mm injectable monopolar needle electrode0  Number of units per muscle Pectoralis Major 50 units Pectoralis Minor 50 units Biceps 25 units Brachioradialis 75 units FCR 25 units FCU 25 units FDS 50 units FDP 50 units FPL 0 units Pronator Teres 50 units Pronator Quadratus 0 units  All injections were done after obtaining appropriate EMG activity and after negative drawback for blood. The patient tolerated the procedure well. Post procedure instructions were given. Return in about 3 months (around 02/14/2018) for botox RUE.  We can perform botox as needed at following visit

## 2017-11-14 NOTE — Telephone Encounter (Signed)
Saw her today for botox. She had a scalp wound which had been glued together. Didn't mention anything to me about sutures.

## 2017-11-14 NOTE — Patient Instructions (Signed)
PLEASE FEEL FREE TO CALL OUR OFFICE WITH ANY PROBLEMS OR QUESTIONS ([phone])      

## 2017-11-15 NOTE — Telephone Encounter (Signed)
Husband is a retired Engineer, drilling and he stated he took them out hisself.

## 2018-01-17 DIAGNOSIS — Z23 Encounter for immunization: Secondary | ICD-10-CM | POA: Diagnosis not present

## 2018-02-14 ENCOUNTER — Encounter: Payer: Medicare Other | Attending: Physical Medicine & Rehabilitation | Admitting: Physical Medicine & Rehabilitation

## 2018-02-14 ENCOUNTER — Encounter: Payer: Self-pay | Admitting: Physical Medicine & Rehabilitation

## 2018-02-14 VITALS — BP 178/106 | HR 63 | Resp 14 | Ht 64.0 in | Wt 105.0 lb

## 2018-02-14 DIAGNOSIS — G8110 Spastic hemiplegia affecting unspecified side: Secondary | ICD-10-CM

## 2018-02-14 DIAGNOSIS — R262 Difficulty in walking, not elsewhere classified: Secondary | ICD-10-CM | POA: Diagnosis not present

## 2018-02-14 DIAGNOSIS — F419 Anxiety disorder, unspecified: Secondary | ICD-10-CM | POA: Diagnosis not present

## 2018-02-14 DIAGNOSIS — G8111 Spastic hemiplegia affecting right dominant side: Secondary | ICD-10-CM | POA: Insufficient documentation

## 2018-02-14 DIAGNOSIS — R531 Weakness: Secondary | ICD-10-CM | POA: Insufficient documentation

## 2018-02-14 DIAGNOSIS — I10 Essential (primary) hypertension: Secondary | ICD-10-CM | POA: Insufficient documentation

## 2018-02-14 DIAGNOSIS — K589 Irritable bowel syndrome without diarrhea: Secondary | ICD-10-CM | POA: Insufficient documentation

## 2018-02-14 DIAGNOSIS — R200 Anesthesia of skin: Secondary | ICD-10-CM | POA: Diagnosis not present

## 2018-02-14 DIAGNOSIS — R252 Cramp and spasm: Secondary | ICD-10-CM | POA: Diagnosis not present

## 2018-02-14 NOTE — Patient Instructions (Signed)
PLEASE FEEL FREE TO CALL OUR OFFICE WITH ANY PROBLEMS OR QUESTIONS ([phone])      

## 2018-02-14 NOTE — Progress Notes (Signed)
Botox Injection for spasticity using needle EMG guidance Indication: Spastic hemiplegia, dominant side (HCC)   Dilution: 100 Units/ml        Total Units Injected: 400 Indication: Severe spasticity which interferes with ADL,mobility and/or  hygiene and is unresponsive to medication management and other conservative care Informed consent was obtained after describing risks and benefits of the procedure with the patient. This includes bleeding, bruising, infection, excessive weakness, or medication side effects. A REMS form is on file and signed.  Needle: 17mm injectable monopolar needle electrode  Number of units per muscle Pectoralis Major 50 units Pectoralis Minor 50 units Biceps 100 units Brachioradialis 50 units FCR 0 units FCU 0 units FDS 0 units FDP 0 units FPL 0 units Pronator Teres 50 units Pronator Quadratus 0 units Teres Major 50 u Teres Minor 50 u  All injections were done after obtaining appropriate EMG activity and after negative drawback for blood. The patient tolerated the procedure well. Post procedure instructions were given. Return in about 2 weeks (around 02/28/2018) for phenol right tibial nerve.

## 2018-02-22 ENCOUNTER — Encounter: Payer: Self-pay | Admitting: Physical Medicine & Rehabilitation

## 2018-02-22 ENCOUNTER — Encounter: Payer: Medicare Other | Attending: Physical Medicine & Rehabilitation | Admitting: Physical Medicine & Rehabilitation

## 2018-02-22 VITALS — BP 148/88 | HR 66 | Ht 64.0 in | Wt 104.0 lb

## 2018-02-22 DIAGNOSIS — I10 Essential (primary) hypertension: Secondary | ICD-10-CM | POA: Insufficient documentation

## 2018-02-22 DIAGNOSIS — K589 Irritable bowel syndrome without diarrhea: Secondary | ICD-10-CM | POA: Diagnosis not present

## 2018-02-22 DIAGNOSIS — G8110 Spastic hemiplegia affecting unspecified side: Secondary | ICD-10-CM | POA: Diagnosis not present

## 2018-02-22 DIAGNOSIS — R531 Weakness: Secondary | ICD-10-CM | POA: Insufficient documentation

## 2018-02-22 DIAGNOSIS — F419 Anxiety disorder, unspecified: Secondary | ICD-10-CM | POA: Diagnosis not present

## 2018-02-22 DIAGNOSIS — G8111 Spastic hemiplegia affecting right dominant side: Secondary | ICD-10-CM | POA: Insufficient documentation

## 2018-02-22 DIAGNOSIS — R262 Difficulty in walking, not elsewhere classified: Secondary | ICD-10-CM | POA: Diagnosis not present

## 2018-02-22 DIAGNOSIS — R200 Anesthesia of skin: Secondary | ICD-10-CM | POA: Insufficient documentation

## 2018-02-22 DIAGNOSIS — R252 Cramp and spasm: Secondary | ICD-10-CM | POA: Insufficient documentation

## 2018-02-22 NOTE — Patient Instructions (Signed)
PLEASE FEEL FREE TO CALL OUR OFFICE WITH ANY PROBLEMS OR QUESTIONS ([phone])      

## 2018-02-22 NOTE — Progress Notes (Signed)
Phenol neurolysis of the right tibial nerve  Indication: Severe spasticity in the plantar flexor muscles which is not responding to medical management and other conservative care and interfering with functional use.  Informed consent was obtained after describing the risks and benefits of the procedure with the patient this includes bleeding bruising and infection as well as medication side effects. The patient elected to proceed and has given written consent. Patient placed in a prone position on the exam table. External DC stimulation was applied to the popliteal space using a nerve stimulator. Plantar flexion twitch was obtained. The popliteal region was prepped with Betadine and then entered with a 22-gauge 40 mm needle electrode under electrical stimulation guidance. Plantar flexion which was obtained and confirmed. Then 4 cc of 5% phenol were injected. The patient tolerated procedure well. Post procedure instructions and followup visit were given.

## 2018-05-19 DIAGNOSIS — I69320 Aphasia following cerebral infarction: Secondary | ICD-10-CM | POA: Diagnosis not present

## 2018-05-29 ENCOUNTER — Encounter: Payer: Self-pay | Admitting: Physical Medicine & Rehabilitation

## 2018-05-29 ENCOUNTER — Encounter: Payer: Medicare Other | Attending: Physical Medicine & Rehabilitation | Admitting: Physical Medicine & Rehabilitation

## 2018-05-29 VITALS — BP 148/89 | HR 66 | Ht 64.0 in | Wt 105.0 lb

## 2018-05-29 DIAGNOSIS — I10 Essential (primary) hypertension: Secondary | ICD-10-CM | POA: Diagnosis not present

## 2018-05-29 DIAGNOSIS — R200 Anesthesia of skin: Secondary | ICD-10-CM | POA: Diagnosis not present

## 2018-05-29 DIAGNOSIS — G8111 Spastic hemiplegia affecting right dominant side: Secondary | ICD-10-CM | POA: Insufficient documentation

## 2018-05-29 DIAGNOSIS — R262 Difficulty in walking, not elsewhere classified: Secondary | ICD-10-CM | POA: Diagnosis not present

## 2018-05-29 DIAGNOSIS — R252 Cramp and spasm: Secondary | ICD-10-CM | POA: Diagnosis not present

## 2018-05-29 DIAGNOSIS — F419 Anxiety disorder, unspecified: Secondary | ICD-10-CM | POA: Insufficient documentation

## 2018-05-29 DIAGNOSIS — K589 Irritable bowel syndrome without diarrhea: Secondary | ICD-10-CM | POA: Insufficient documentation

## 2018-05-29 DIAGNOSIS — R531 Weakness: Secondary | ICD-10-CM | POA: Insufficient documentation

## 2018-05-29 DIAGNOSIS — G8110 Spastic hemiplegia affecting unspecified side: Secondary | ICD-10-CM

## 2018-05-29 NOTE — Patient Instructions (Signed)
PLEASE FEEL FREE TO CALL OUR OFFICE WITH ANY PROBLEMS OR QUESTIONS ([phone])      

## 2018-05-29 NOTE — Progress Notes (Signed)
Botox Injection for spasticity using needle EMG guidance Indication: Spastic hemiplegia, dominant side (HCC)   Dilution: 100 Units/ml        Total Units Injected: 300 Indication: Severe spasticity which interferes with ADL,mobility and/or  hygiene and is unresponsive to medication management and other conservative care Informed consent was obtained after describing risks and benefits of the procedure with the patient. This includes bleeding, bruising, infection, excessive weakness, or medication side effects. A REMS form is on file and signed.  Needle: 45mm injectable monopolar needle electrode  Number of units per muscle Pectoralis Major 0 units Pectoralis Minor 0 units Biceps 0 units Brachioradialis 0 units FCR 25 units FCU 25 units FDS 125  units FDP 100 units FPL 25 units Pronator Teres 0 units Pronator Quadratus 0 units  All injections were done after obtaining appropriate EMG activity and after negative drawback for blood. The patient tolerated the procedure well. Post procedure instructions were given. Return in about 3 months (around 08/27/2018) for botox??.

## 2018-06-02 DIAGNOSIS — I69320 Aphasia following cerebral infarction: Secondary | ICD-10-CM | POA: Diagnosis not present

## 2018-06-09 DIAGNOSIS — I69320 Aphasia following cerebral infarction: Secondary | ICD-10-CM | POA: Diagnosis not present

## 2018-06-16 DIAGNOSIS — I69320 Aphasia following cerebral infarction: Secondary | ICD-10-CM | POA: Diagnosis not present

## 2018-07-10 ENCOUNTER — Ambulatory Visit: Payer: Medicare Other | Admitting: Neurology

## 2018-08-03 ENCOUNTER — Telehealth: Payer: Self-pay

## 2018-08-03 NOTE — Telephone Encounter (Signed)
Unable to get in contact with the patient to convert their office visit into a webex or visit. I left them a voicemail asking to return my call. Office number was provided.

## 2018-08-07 NOTE — Telephone Encounter (Signed)
Due to current COVID 19 pandemic, our office is severely reducing in office visits for at least the next 2 weeks, in order to minimize the risk to our patients and healthcare providers. Pt understands that although there may be some limitations with this type of visit, we will take all precautions to reduce any security or privacy concerns.  Pt understands that this will be treated like an in office visit and we will file with pt's insurance, and there may be a patient responsible charge related to this service. [name] email is [email or web address]. Pt understands that the [employer] webex software must be downloaded and operational on the device pt plans to use for the visit. FYI spoke with pts husband Herbie [place]

## 2018-08-10 ENCOUNTER — Ambulatory Visit (INDEPENDENT_AMBULATORY_CARE_PROVIDER_SITE_OTHER): Payer: Medicare Other | Admitting: Neurology

## 2018-08-10 ENCOUNTER — Other Ambulatory Visit: Payer: Self-pay

## 2018-08-10 DIAGNOSIS — G8110 Spastic hemiplegia affecting unspecified side: Secondary | ICD-10-CM

## 2018-08-10 NOTE — Progress Notes (Signed)
Virtual Visit via Video Note  I connected with [name] on 08/10/18 at 10:30 AM EDT by a video enabled telemedicine application and verified that I am speaking with the correct person using two identifiers.  The patient and her husband were in their home for this virtual video visit and I have was in my office.   I discussed the limitations of evaluation and management by telemedicine and the availability of in person appointments. The patient expressed understanding and agreed to proceed.  History of Present Illness: [name] is seen today for virtual video follow-up visit following her last visit on 06/28/2017.  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  She had obtain improvement in her speech and was in a program at [facility] which is now on hold due to the ongoing [employer] pandemic.  She continues to have severe spasticity in her right arm with involuntary pronation occurring which is causing a lot of discomfort.  I had tried switching her baclofen to Zanaflex at last visit but she had trouble tolerating it due to side effects and went back on baclofen.  She is currently taking 35 mg daily and tolerating it well.  She also is seeing Dr. Tessa Lerner for spasticity injections.  At last visit she was on Dysport which were not working and she has since been switched back to Botox.  She seems dissatisfied with the results of Botox as it works for barely a week now and she may be developing intolerance.  She and her husband are looking for alternative treatment options for her spasticity and have mentioned this to Dr. Tessa Lerner.  She had phenol injection into her right popliteal fossa for her right leg spasticity which seems to have worked.  She states her blood pressure is under good good control she remains on Norvasc and Cozaar.  She states her lipids are also well controlled and she is on Lipitor 20 mg daily.   She has no new complaints.   Observations/Objective: Physical and neurological exam is limited due to constraints from video visit.  Pleasant elderly Caucasian lady not in distress.  She is sitting in a wheelchair.  She is awake alert oriented.  She has moderate expressive aphasia with nonfluent speech with word finding disease difficulties and a few paraphasias.  She can speak short sentences.  Extraocular movements are full range without nystagmus.  There is mild right lower facial weakness.  Tongue is midline.  Spastic right hemiplegia with right upper extremity 0/5 strength with flexion in the right elbow wrist and fingers.  Right lower extremity strength is 2/5 with weakness of right ankle dorsiflexors.  She has normal strength on the left.  Assessment and Plan: 78 year old  Caucasian lady with hypertensive left basal ganglia hemorrhage in April 2017 who has significant residual expressive aphasia and spastic dense right hemiplegia.  She continues to have bothersome spasticity in the right upper extremity despite trials of Dysport, Botox, baclofen and Zanaflex  Follow Up Instructions: I had a long discussion with the patient and her husband Dr. London Pepper about her significant post stroke spasticity and discuss treatment options and answered questions.  I recommend we increase the baclofen dose to 40 mg daily and see if she can tolerate it.  Continue follow-up with Dr. Tessa Lerner for Botox injections but I advised him to discuss alternative treatment options if any or referral to a tertiary care center.  Continue to  maintain strict control of hypertension with blood pressure goal below 130/90 and diabetes with hemoglobin A1c goal below 6.5% and lipids with LDL cholesterol goal below 70 mg percent.  She will return for follow-up in the future in a year or call earlier if necessary.  I discussed the assessment and treatment plan with the patient. The patient was provided an opportunity to ask questions and  all were answered. The patient agreed with the plan and demonstrated an understanding of the instructions.   The patient was advised to call back or seek an in-person evaluation if the symptoms worsen or if the condition fails to improve as anticipated.  I provided 25 minutes of non-face-to-face time during this encounter.   Antony Contras, MD

## 2018-08-30 ENCOUNTER — Other Ambulatory Visit: Payer: Self-pay

## 2018-08-30 ENCOUNTER — Encounter: Payer: Medicare Other | Attending: Physical Medicine & Rehabilitation | Admitting: Physical Medicine & Rehabilitation

## 2018-08-30 ENCOUNTER — Encounter: Payer: Medicare Other | Admitting: Physical Medicine & Rehabilitation

## 2018-08-30 ENCOUNTER — Encounter: Payer: Self-pay | Admitting: Physical Medicine & Rehabilitation

## 2018-08-30 VITALS — BP 152/92 | HR 60 | Temp 97.8°F | Ht 64.0 in | Wt 108.0 lb

## 2018-08-30 DIAGNOSIS — R252 Cramp and spasm: Secondary | ICD-10-CM | POA: Diagnosis not present

## 2018-08-30 DIAGNOSIS — R531 Weakness: Secondary | ICD-10-CM | POA: Insufficient documentation

## 2018-08-30 DIAGNOSIS — R200 Anesthesia of skin: Secondary | ICD-10-CM | POA: Diagnosis not present

## 2018-08-30 DIAGNOSIS — K589 Irritable bowel syndrome without diarrhea: Secondary | ICD-10-CM | POA: Insufficient documentation

## 2018-08-30 DIAGNOSIS — G8190 Hemiplegia, unspecified affecting unspecified side: Secondary | ICD-10-CM

## 2018-08-30 DIAGNOSIS — I10 Essential (primary) hypertension: Secondary | ICD-10-CM | POA: Insufficient documentation

## 2018-08-30 DIAGNOSIS — R262 Difficulty in walking, not elsewhere classified: Secondary | ICD-10-CM | POA: Diagnosis not present

## 2018-08-30 DIAGNOSIS — G8110 Spastic hemiplegia affecting unspecified side: Secondary | ICD-10-CM

## 2018-08-30 DIAGNOSIS — F419 Anxiety disorder, unspecified: Secondary | ICD-10-CM | POA: Diagnosis not present

## 2018-08-30 DIAGNOSIS — G8111 Spastic hemiplegia affecting right dominant side: Secondary | ICD-10-CM | POA: Insufficient documentation

## 2018-08-30 NOTE — Progress Notes (Signed)
Botox Injection for spasticity using needle EMG guidance Indication: Spastic hemiplegia, dominant side (HCC)  Hemiplegia affecting dominant side (HCC)   Dilution: 100 Units/ml        Total Units Injected: 400 Indication: Severe spasticity which interferes with ADL,mobility and/or  hygiene and is unresponsive to medication management and other conservative care Informed consent was obtained after describing risks and benefits of the procedure with the patient. This includes bleeding, bruising, infection, excessive weakness, or medication side effects. A REMS form is on file and signed.  Needle: 80mm injectable monopolar needle electrode  Number of units per muscle Pectoralis Major 0 units Pectoralis Minor 0 units Biceps 100 units Brachioradialis 100 units FCR 0 units FCU 0 units FDS 75 units FDP 75 units FPL 0 units Pronator Teres 50 units Pronator Quadratus 0 units  All injections were done after obtaining appropriate EMG activity and after negative drawback for blood. The patient tolerated the procedure well. Post procedure instructions were given. Return in about 3 months (around 11/30/2018) for botox 400u rue.

## 2018-08-30 NOTE — Patient Instructions (Signed)
PLEASE FEEL FREE TO CALL OUR OFFICE WITH ANY PROBLEMS OR QUESTIONS ([phone])      

## 2018-10-18 DIAGNOSIS — E7849 Other hyperlipidemia: Secondary | ICD-10-CM | POA: Diagnosis not present

## 2018-10-18 DIAGNOSIS — E559 Vitamin D deficiency, unspecified: Secondary | ICD-10-CM | POA: Diagnosis not present

## 2018-10-23 DIAGNOSIS — R82998 Other abnormal findings in urine: Secondary | ICD-10-CM | POA: Diagnosis not present

## 2018-10-23 DIAGNOSIS — I10 Essential (primary) hypertension: Secondary | ICD-10-CM | POA: Diagnosis not present

## 2018-10-25 DIAGNOSIS — Z0000 Encounter for general adult medical examination without abnormal findings: Secondary | ICD-10-CM | POA: Diagnosis not present

## 2018-10-25 DIAGNOSIS — Z1339 Encounter for screening examination for other mental health and behavioral disorders: Secondary | ICD-10-CM | POA: Diagnosis not present

## 2018-10-25 DIAGNOSIS — Z1331 Encounter for screening for depression: Secondary | ICD-10-CM | POA: Diagnosis not present

## 2018-10-25 DIAGNOSIS — E559 Vitamin D deficiency, unspecified: Secondary | ICD-10-CM | POA: Diagnosis not present

## 2018-10-25 DIAGNOSIS — I69359 Hemiplegia and hemiparesis following cerebral infarction affecting unspecified side: Secondary | ICD-10-CM | POA: Diagnosis not present

## 2018-10-25 DIAGNOSIS — B009 Herpesviral infection, unspecified: Secondary | ICD-10-CM | POA: Diagnosis not present

## 2018-10-25 DIAGNOSIS — G8110 Spastic hemiplegia affecting unspecified side: Secondary | ICD-10-CM | POA: Diagnosis not present

## 2018-10-25 DIAGNOSIS — R946 Abnormal results of thyroid function studies: Secondary | ICD-10-CM | POA: Diagnosis not present

## 2018-10-25 DIAGNOSIS — I350 Nonrheumatic aortic (valve) stenosis: Secondary | ICD-10-CM | POA: Diagnosis not present

## 2018-10-25 DIAGNOSIS — J309 Allergic rhinitis, unspecified: Secondary | ICD-10-CM | POA: Diagnosis not present

## 2018-10-25 DIAGNOSIS — I6389 Other cerebral infarction: Secondary | ICD-10-CM | POA: Diagnosis not present

## 2018-11-03 DIAGNOSIS — M6389 Disorders of muscle in diseases classified elsewhere, multiple sites: Secondary | ICD-10-CM | POA: Diagnosis not present

## 2018-11-03 DIAGNOSIS — R293 Abnormal posture: Secondary | ICD-10-CM | POA: Diagnosis not present

## 2018-11-03 DIAGNOSIS — Z8673 Personal history of transient ischemic attack (TIA), and cerebral infarction without residual deficits: Secondary | ICD-10-CM | POA: Diagnosis not present

## 2018-11-03 DIAGNOSIS — G8111 Spastic hemiplegia affecting right dominant side: Secondary | ICD-10-CM | POA: Diagnosis not present

## 2018-11-03 DIAGNOSIS — I6389 Other cerebral infarction: Secondary | ICD-10-CM | POA: Diagnosis not present

## 2018-11-03 DIAGNOSIS — R278 Other lack of coordination: Secondary | ICD-10-CM | POA: Diagnosis not present

## 2018-11-03 DIAGNOSIS — R2689 Other abnormalities of gait and mobility: Secondary | ICD-10-CM | POA: Diagnosis not present

## 2018-11-07 DIAGNOSIS — M6389 Disorders of muscle in diseases classified elsewhere, multiple sites: Secondary | ICD-10-CM | POA: Diagnosis not present

## 2018-11-07 DIAGNOSIS — R2689 Other abnormalities of gait and mobility: Secondary | ICD-10-CM | POA: Diagnosis not present

## 2018-11-07 DIAGNOSIS — R293 Abnormal posture: Secondary | ICD-10-CM | POA: Diagnosis not present

## 2018-11-07 DIAGNOSIS — R278 Other lack of coordination: Secondary | ICD-10-CM | POA: Diagnosis not present

## 2018-11-07 DIAGNOSIS — G8111 Spastic hemiplegia affecting right dominant side: Secondary | ICD-10-CM | POA: Diagnosis not present

## 2018-11-07 DIAGNOSIS — I6389 Other cerebral infarction: Secondary | ICD-10-CM | POA: Diagnosis not present

## 2018-11-09 DIAGNOSIS — I6389 Other cerebral infarction: Secondary | ICD-10-CM | POA: Diagnosis not present

## 2018-11-09 DIAGNOSIS — R2689 Other abnormalities of gait and mobility: Secondary | ICD-10-CM | POA: Diagnosis not present

## 2018-11-09 DIAGNOSIS — R293 Abnormal posture: Secondary | ICD-10-CM | POA: Diagnosis not present

## 2018-11-09 DIAGNOSIS — M6389 Disorders of muscle in diseases classified elsewhere, multiple sites: Secondary | ICD-10-CM | POA: Diagnosis not present

## 2018-11-09 DIAGNOSIS — R278 Other lack of coordination: Secondary | ICD-10-CM | POA: Diagnosis not present

## 2018-11-09 DIAGNOSIS — G8111 Spastic hemiplegia affecting right dominant side: Secondary | ICD-10-CM | POA: Diagnosis not present

## 2018-11-13 DIAGNOSIS — R2689 Other abnormalities of gait and mobility: Secondary | ICD-10-CM | POA: Diagnosis not present

## 2018-11-13 DIAGNOSIS — R293 Abnormal posture: Secondary | ICD-10-CM | POA: Diagnosis not present

## 2018-11-13 DIAGNOSIS — I6389 Other cerebral infarction: Secondary | ICD-10-CM | POA: Diagnosis not present

## 2018-11-13 DIAGNOSIS — R278 Other lack of coordination: Secondary | ICD-10-CM | POA: Diagnosis not present

## 2018-11-13 DIAGNOSIS — G8111 Spastic hemiplegia affecting right dominant side: Secondary | ICD-10-CM | POA: Diagnosis not present

## 2018-11-13 DIAGNOSIS — M6389 Disorders of muscle in diseases classified elsewhere, multiple sites: Secondary | ICD-10-CM | POA: Diagnosis not present

## 2018-11-14 DIAGNOSIS — R278 Other lack of coordination: Secondary | ICD-10-CM | POA: Diagnosis not present

## 2018-11-14 DIAGNOSIS — G8111 Spastic hemiplegia affecting right dominant side: Secondary | ICD-10-CM | POA: Diagnosis not present

## 2018-11-14 DIAGNOSIS — M6389 Disorders of muscle in diseases classified elsewhere, multiple sites: Secondary | ICD-10-CM | POA: Diagnosis not present

## 2018-11-14 DIAGNOSIS — R2689 Other abnormalities of gait and mobility: Secondary | ICD-10-CM | POA: Diagnosis not present

## 2018-11-14 DIAGNOSIS — I6389 Other cerebral infarction: Secondary | ICD-10-CM | POA: Diagnosis not present

## 2018-11-14 DIAGNOSIS — R293 Abnormal posture: Secondary | ICD-10-CM | POA: Diagnosis not present

## 2018-11-16 DIAGNOSIS — R2689 Other abnormalities of gait and mobility: Secondary | ICD-10-CM | POA: Diagnosis not present

## 2018-11-16 DIAGNOSIS — G8111 Spastic hemiplegia affecting right dominant side: Secondary | ICD-10-CM | POA: Diagnosis not present

## 2018-11-16 DIAGNOSIS — R278 Other lack of coordination: Secondary | ICD-10-CM | POA: Diagnosis not present

## 2018-11-16 DIAGNOSIS — I6389 Other cerebral infarction: Secondary | ICD-10-CM | POA: Diagnosis not present

## 2018-11-16 DIAGNOSIS — M6389 Disorders of muscle in diseases classified elsewhere, multiple sites: Secondary | ICD-10-CM | POA: Diagnosis not present

## 2018-11-16 DIAGNOSIS — R293 Abnormal posture: Secondary | ICD-10-CM | POA: Diagnosis not present

## 2018-11-21 DIAGNOSIS — Z8673 Personal history of transient ischemic attack (TIA), and cerebral infarction without residual deficits: Secondary | ICD-10-CM | POA: Diagnosis not present

## 2018-11-21 DIAGNOSIS — R2689 Other abnormalities of gait and mobility: Secondary | ICD-10-CM | POA: Diagnosis not present

## 2018-11-21 DIAGNOSIS — G8111 Spastic hemiplegia affecting right dominant side: Secondary | ICD-10-CM | POA: Diagnosis not present

## 2018-11-21 DIAGNOSIS — M6389 Disorders of muscle in diseases classified elsewhere, multiple sites: Secondary | ICD-10-CM | POA: Diagnosis not present

## 2018-11-21 DIAGNOSIS — I6389 Other cerebral infarction: Secondary | ICD-10-CM | POA: Diagnosis not present

## 2018-11-21 DIAGNOSIS — R278 Other lack of coordination: Secondary | ICD-10-CM | POA: Diagnosis not present

## 2018-11-21 DIAGNOSIS — R293 Abnormal posture: Secondary | ICD-10-CM | POA: Diagnosis not present

## 2018-11-22 DIAGNOSIS — G8111 Spastic hemiplegia affecting right dominant side: Secondary | ICD-10-CM | POA: Diagnosis not present

## 2018-11-22 DIAGNOSIS — I6389 Other cerebral infarction: Secondary | ICD-10-CM | POA: Diagnosis not present

## 2018-11-22 DIAGNOSIS — M6389 Disorders of muscle in diseases classified elsewhere, multiple sites: Secondary | ICD-10-CM | POA: Diagnosis not present

## 2018-11-22 DIAGNOSIS — R278 Other lack of coordination: Secondary | ICD-10-CM | POA: Diagnosis not present

## 2018-11-22 DIAGNOSIS — R293 Abnormal posture: Secondary | ICD-10-CM | POA: Diagnosis not present

## 2018-11-22 DIAGNOSIS — R2689 Other abnormalities of gait and mobility: Secondary | ICD-10-CM | POA: Diagnosis not present

## 2018-11-24 DIAGNOSIS — I6389 Other cerebral infarction: Secondary | ICD-10-CM | POA: Diagnosis not present

## 2018-11-24 DIAGNOSIS — R278 Other lack of coordination: Secondary | ICD-10-CM | POA: Diagnosis not present

## 2018-11-24 DIAGNOSIS — G8111 Spastic hemiplegia affecting right dominant side: Secondary | ICD-10-CM | POA: Diagnosis not present

## 2018-11-24 DIAGNOSIS — M6389 Disorders of muscle in diseases classified elsewhere, multiple sites: Secondary | ICD-10-CM | POA: Diagnosis not present

## 2018-11-24 DIAGNOSIS — R293 Abnormal posture: Secondary | ICD-10-CM | POA: Diagnosis not present

## 2018-11-24 DIAGNOSIS — R2689 Other abnormalities of gait and mobility: Secondary | ICD-10-CM | POA: Diagnosis not present

## 2018-11-27 DIAGNOSIS — R293 Abnormal posture: Secondary | ICD-10-CM | POA: Diagnosis not present

## 2018-11-27 DIAGNOSIS — M6389 Disorders of muscle in diseases classified elsewhere, multiple sites: Secondary | ICD-10-CM | POA: Diagnosis not present

## 2018-11-27 DIAGNOSIS — G8111 Spastic hemiplegia affecting right dominant side: Secondary | ICD-10-CM | POA: Diagnosis not present

## 2018-11-27 DIAGNOSIS — I6389 Other cerebral infarction: Secondary | ICD-10-CM | POA: Diagnosis not present

## 2018-11-27 DIAGNOSIS — R278 Other lack of coordination: Secondary | ICD-10-CM | POA: Diagnosis not present

## 2018-11-27 DIAGNOSIS — R2689 Other abnormalities of gait and mobility: Secondary | ICD-10-CM | POA: Diagnosis not present

## 2018-11-29 DIAGNOSIS — G8111 Spastic hemiplegia affecting right dominant side: Secondary | ICD-10-CM | POA: Diagnosis not present

## 2018-11-29 DIAGNOSIS — I6389 Other cerebral infarction: Secondary | ICD-10-CM | POA: Diagnosis not present

## 2018-11-29 DIAGNOSIS — M6389 Disorders of muscle in diseases classified elsewhere, multiple sites: Secondary | ICD-10-CM | POA: Diagnosis not present

## 2018-11-29 DIAGNOSIS — R278 Other lack of coordination: Secondary | ICD-10-CM | POA: Diagnosis not present

## 2018-11-29 DIAGNOSIS — R2689 Other abnormalities of gait and mobility: Secondary | ICD-10-CM | POA: Diagnosis not present

## 2018-11-29 DIAGNOSIS — R293 Abnormal posture: Secondary | ICD-10-CM | POA: Diagnosis not present

## 2018-12-04 DIAGNOSIS — I6389 Other cerebral infarction: Secondary | ICD-10-CM | POA: Diagnosis not present

## 2018-12-04 DIAGNOSIS — R278 Other lack of coordination: Secondary | ICD-10-CM | POA: Diagnosis not present

## 2018-12-04 DIAGNOSIS — G8111 Spastic hemiplegia affecting right dominant side: Secondary | ICD-10-CM | POA: Diagnosis not present

## 2018-12-04 DIAGNOSIS — R293 Abnormal posture: Secondary | ICD-10-CM | POA: Diagnosis not present

## 2018-12-04 DIAGNOSIS — M6389 Disorders of muscle in diseases classified elsewhere, multiple sites: Secondary | ICD-10-CM | POA: Diagnosis not present

## 2018-12-04 DIAGNOSIS — R2689 Other abnormalities of gait and mobility: Secondary | ICD-10-CM | POA: Diagnosis not present

## 2018-12-05 DIAGNOSIS — M6389 Disorders of muscle in diseases classified elsewhere, multiple sites: Secondary | ICD-10-CM | POA: Diagnosis not present

## 2018-12-05 DIAGNOSIS — R2689 Other abnormalities of gait and mobility: Secondary | ICD-10-CM | POA: Diagnosis not present

## 2018-12-05 DIAGNOSIS — I6389 Other cerebral infarction: Secondary | ICD-10-CM | POA: Diagnosis not present

## 2018-12-05 DIAGNOSIS — R293 Abnormal posture: Secondary | ICD-10-CM | POA: Diagnosis not present

## 2018-12-05 DIAGNOSIS — G8111 Spastic hemiplegia affecting right dominant side: Secondary | ICD-10-CM | POA: Diagnosis not present

## 2018-12-05 DIAGNOSIS — R278 Other lack of coordination: Secondary | ICD-10-CM | POA: Diagnosis not present

## 2018-12-06 ENCOUNTER — Encounter: Payer: Medicare Other | Attending: Physical Medicine & Rehabilitation | Admitting: Physical Medicine & Rehabilitation

## 2018-12-06 ENCOUNTER — Other Ambulatory Visit: Payer: Self-pay

## 2018-12-06 ENCOUNTER — Encounter: Payer: Self-pay | Admitting: Physical Medicine & Rehabilitation

## 2018-12-06 VITALS — BP 159/88 | HR 66 | Temp 97.8°F | Ht 65.0 in | Wt 109.8 lb

## 2018-12-06 DIAGNOSIS — R252 Cramp and spasm: Secondary | ICD-10-CM | POA: Insufficient documentation

## 2018-12-06 DIAGNOSIS — K589 Irritable bowel syndrome without diarrhea: Secondary | ICD-10-CM | POA: Insufficient documentation

## 2018-12-06 DIAGNOSIS — R200 Anesthesia of skin: Secondary | ICD-10-CM | POA: Insufficient documentation

## 2018-12-06 DIAGNOSIS — G8110 Spastic hemiplegia affecting unspecified side: Secondary | ICD-10-CM

## 2018-12-06 DIAGNOSIS — F419 Anxiety disorder, unspecified: Secondary | ICD-10-CM | POA: Insufficient documentation

## 2018-12-06 DIAGNOSIS — G8111 Spastic hemiplegia affecting right dominant side: Secondary | ICD-10-CM | POA: Insufficient documentation

## 2018-12-06 DIAGNOSIS — I10 Essential (primary) hypertension: Secondary | ICD-10-CM | POA: Diagnosis not present

## 2018-12-06 DIAGNOSIS — R262 Difficulty in walking, not elsewhere classified: Secondary | ICD-10-CM | POA: Insufficient documentation

## 2018-12-06 DIAGNOSIS — R531 Weakness: Secondary | ICD-10-CM | POA: Insufficient documentation

## 2018-12-06 NOTE — Progress Notes (Signed)
Botox Injection for spasticity using needle EMG guidance Indication: Spastic hemiplegia, dominant side (HCC) -   Dilution: 100 Units/ml        Total Units Injected: 400 Indication: Severe spasticity which interferes with ADL,mobility and/or  hygiene and is unresponsive to medication management and other conservative care Informed consent was obtained after describing risks and benefits of the procedure with the patient. This includes bleeding, bruising, infection, excessive weakness, or medication side effects. A REMS form is on file and signed.  Needle: 58mm injectable monopolar needle electrode  Number of units per muscle Pectoralis Major 0 units Pectoralis Minor 0 units Biceps 0 units Brachioradialis 0 units FCR 25 units FCU 25 units FDS 100 units FDP 100 units FPL 0 units Pronator Teres 150 units Pronator Quadratus 0 units  All injections were done after obtaining appropriate EMG activity and after negative drawback for blood. The patient tolerated the procedure well. Post procedure instructions were given. Return in about 3 months (around 03/08/2019) for follow up botox as needed RUE.

## 2018-12-06 NOTE — Patient Instructions (Signed)
PLEASE FEEL FREE TO CALL OUR OFFICE WITH ANY PROBLEMS OR QUESTIONS ([phone])      

## 2018-12-07 DIAGNOSIS — R278 Other lack of coordination: Secondary | ICD-10-CM | POA: Diagnosis not present

## 2018-12-07 DIAGNOSIS — I6389 Other cerebral infarction: Secondary | ICD-10-CM | POA: Diagnosis not present

## 2018-12-07 DIAGNOSIS — G8111 Spastic hemiplegia affecting right dominant side: Secondary | ICD-10-CM | POA: Diagnosis not present

## 2018-12-07 DIAGNOSIS — R293 Abnormal posture: Secondary | ICD-10-CM | POA: Diagnosis not present

## 2018-12-07 DIAGNOSIS — R2689 Other abnormalities of gait and mobility: Secondary | ICD-10-CM | POA: Diagnosis not present

## 2018-12-07 DIAGNOSIS — M6389 Disorders of muscle in diseases classified elsewhere, multiple sites: Secondary | ICD-10-CM | POA: Diagnosis not present

## 2018-12-14 DIAGNOSIS — M6389 Disorders of muscle in diseases classified elsewhere, multiple sites: Secondary | ICD-10-CM | POA: Diagnosis not present

## 2018-12-14 DIAGNOSIS — R2689 Other abnormalities of gait and mobility: Secondary | ICD-10-CM | POA: Diagnosis not present

## 2018-12-14 DIAGNOSIS — I6389 Other cerebral infarction: Secondary | ICD-10-CM | POA: Diagnosis not present

## 2018-12-14 DIAGNOSIS — R293 Abnormal posture: Secondary | ICD-10-CM | POA: Diagnosis not present

## 2018-12-14 DIAGNOSIS — G8111 Spastic hemiplegia affecting right dominant side: Secondary | ICD-10-CM | POA: Diagnosis not present

## 2018-12-14 DIAGNOSIS — R278 Other lack of coordination: Secondary | ICD-10-CM | POA: Diagnosis not present

## 2018-12-22 DIAGNOSIS — R293 Abnormal posture: Secondary | ICD-10-CM | POA: Diagnosis not present

## 2018-12-22 DIAGNOSIS — M6389 Disorders of muscle in diseases classified elsewhere, multiple sites: Secondary | ICD-10-CM | POA: Diagnosis not present

## 2018-12-22 DIAGNOSIS — G8111 Spastic hemiplegia affecting right dominant side: Secondary | ICD-10-CM | POA: Diagnosis not present

## 2018-12-22 DIAGNOSIS — Z8673 Personal history of transient ischemic attack (TIA), and cerebral infarction without residual deficits: Secondary | ICD-10-CM | POA: Diagnosis not present

## 2019-01-03 DIAGNOSIS — Z8673 Personal history of transient ischemic attack (TIA), and cerebral infarction without residual deficits: Secondary | ICD-10-CM | POA: Diagnosis not present

## 2019-01-03 DIAGNOSIS — G8111 Spastic hemiplegia affecting right dominant side: Secondary | ICD-10-CM | POA: Diagnosis not present

## 2019-01-03 DIAGNOSIS — M6389 Disorders of muscle in diseases classified elsewhere, multiple sites: Secondary | ICD-10-CM | POA: Diagnosis not present

## 2019-01-03 DIAGNOSIS — R293 Abnormal posture: Secondary | ICD-10-CM | POA: Diagnosis not present

## 2019-01-04 DIAGNOSIS — R293 Abnormal posture: Secondary | ICD-10-CM | POA: Diagnosis not present

## 2019-01-04 DIAGNOSIS — M6389 Disorders of muscle in diseases classified elsewhere, multiple sites: Secondary | ICD-10-CM | POA: Diagnosis not present

## 2019-01-04 DIAGNOSIS — G8111 Spastic hemiplegia affecting right dominant side: Secondary | ICD-10-CM | POA: Diagnosis not present

## 2019-01-04 DIAGNOSIS — Z8673 Personal history of transient ischemic attack (TIA), and cerebral infarction without residual deficits: Secondary | ICD-10-CM | POA: Diagnosis not present

## 2019-01-16 DIAGNOSIS — Z780 Asymptomatic menopausal state: Secondary | ICD-10-CM | POA: Diagnosis not present

## 2019-01-19 DIAGNOSIS — G8111 Spastic hemiplegia affecting right dominant side: Secondary | ICD-10-CM | POA: Diagnosis not present

## 2019-01-19 DIAGNOSIS — Z8673 Personal history of transient ischemic attack (TIA), and cerebral infarction without residual deficits: Secondary | ICD-10-CM | POA: Diagnosis not present

## 2019-01-19 DIAGNOSIS — M6389 Disorders of muscle in diseases classified elsewhere, multiple sites: Secondary | ICD-10-CM | POA: Diagnosis not present

## 2019-01-19 DIAGNOSIS — R293 Abnormal posture: Secondary | ICD-10-CM | POA: Diagnosis not present

## 2019-02-01 DIAGNOSIS — Z23 Encounter for immunization: Secondary | ICD-10-CM | POA: Diagnosis not present

## 2019-02-02 DIAGNOSIS — M6389 Disorders of muscle in diseases classified elsewhere, multiple sites: Secondary | ICD-10-CM | POA: Diagnosis not present

## 2019-02-02 DIAGNOSIS — Z8673 Personal history of transient ischemic attack (TIA), and cerebral infarction without residual deficits: Secondary | ICD-10-CM | POA: Diagnosis not present

## 2019-02-02 DIAGNOSIS — R293 Abnormal posture: Secondary | ICD-10-CM | POA: Diagnosis not present

## 2019-02-02 DIAGNOSIS — G8111 Spastic hemiplegia affecting right dominant side: Secondary | ICD-10-CM | POA: Diagnosis not present

## 2019-02-16 DIAGNOSIS — Z8673 Personal history of transient ischemic attack (TIA), and cerebral infarction without residual deficits: Secondary | ICD-10-CM | POA: Diagnosis not present

## 2019-02-16 DIAGNOSIS — R293 Abnormal posture: Secondary | ICD-10-CM | POA: Diagnosis not present

## 2019-02-16 DIAGNOSIS — G8111 Spastic hemiplegia affecting right dominant side: Secondary | ICD-10-CM | POA: Diagnosis not present

## 2019-02-16 DIAGNOSIS — M6389 Disorders of muscle in diseases classified elsewhere, multiple sites: Secondary | ICD-10-CM | POA: Diagnosis not present

## 2019-02-23 DIAGNOSIS — R293 Abnormal posture: Secondary | ICD-10-CM | POA: Diagnosis not present

## 2019-02-23 DIAGNOSIS — M6389 Disorders of muscle in diseases classified elsewhere, multiple sites: Secondary | ICD-10-CM | POA: Diagnosis not present

## 2019-02-23 DIAGNOSIS — G8111 Spastic hemiplegia affecting right dominant side: Secondary | ICD-10-CM | POA: Diagnosis not present

## 2019-02-23 DIAGNOSIS — Z8673 Personal history of transient ischemic attack (TIA), and cerebral infarction without residual deficits: Secondary | ICD-10-CM | POA: Diagnosis not present

## 2019-03-02 DIAGNOSIS — R293 Abnormal posture: Secondary | ICD-10-CM | POA: Diagnosis not present

## 2019-03-02 DIAGNOSIS — M6389 Disorders of muscle in diseases classified elsewhere, multiple sites: Secondary | ICD-10-CM | POA: Diagnosis not present

## 2019-03-02 DIAGNOSIS — G8111 Spastic hemiplegia affecting right dominant side: Secondary | ICD-10-CM | POA: Diagnosis not present

## 2019-03-02 DIAGNOSIS — Z8673 Personal history of transient ischemic attack (TIA), and cerebral infarction without residual deficits: Secondary | ICD-10-CM | POA: Diagnosis not present

## 2019-03-07 ENCOUNTER — Encounter: Payer: Medicare Other | Attending: Physical Medicine & Rehabilitation | Admitting: Physical Medicine & Rehabilitation

## 2019-03-07 ENCOUNTER — Other Ambulatory Visit: Payer: Self-pay

## 2019-03-07 ENCOUNTER — Encounter: Payer: Self-pay | Admitting: Physical Medicine & Rehabilitation

## 2019-03-07 VITALS — BP 132/66 | HR 69 | Temp 97.9°F

## 2019-03-07 DIAGNOSIS — F419 Anxiety disorder, unspecified: Secondary | ICD-10-CM | POA: Insufficient documentation

## 2019-03-07 DIAGNOSIS — R200 Anesthesia of skin: Secondary | ICD-10-CM | POA: Insufficient documentation

## 2019-03-07 DIAGNOSIS — R262 Difficulty in walking, not elsewhere classified: Secondary | ICD-10-CM | POA: Diagnosis not present

## 2019-03-07 DIAGNOSIS — G8110 Spastic hemiplegia affecting unspecified side: Secondary | ICD-10-CM

## 2019-03-07 DIAGNOSIS — R531 Weakness: Secondary | ICD-10-CM | POA: Diagnosis not present

## 2019-03-07 DIAGNOSIS — I10 Essential (primary) hypertension: Secondary | ICD-10-CM | POA: Diagnosis not present

## 2019-03-07 DIAGNOSIS — G8111 Spastic hemiplegia affecting right dominant side: Secondary | ICD-10-CM | POA: Diagnosis not present

## 2019-03-07 DIAGNOSIS — R252 Cramp and spasm: Secondary | ICD-10-CM | POA: Diagnosis not present

## 2019-03-07 DIAGNOSIS — K589 Irritable bowel syndrome without diarrhea: Secondary | ICD-10-CM | POA: Diagnosis not present

## 2019-03-07 NOTE — Progress Notes (Signed)
Botox Injection for spasticity using needle EMG guidance Indication: Spastic hemiplegia, dominant side (HCC)   Dilution: 100 Units/ml        Total Units Injected: 500 Indication: Severe spasticity which interferes with ADL,mobility and/or  hygiene and is unresponsive to medication management and other conservative care Informed consent was obtained after describing risks and benefits of the procedure with the patient. This includes bleeding, bruising, infection, excessive weakness, or medication side effects. A REMS form is on file and signed.  Needle: 25mm injectable monopolar needle electrode  Right  Number of units per muscle Pectoralis Major 75 units Pectoralis Minor 125 units Biceps 100 units Brachioradialis 100 units FCR 0 units FCU 0 units FDS 0 units FDP 0 units FPL 0 units Pronator Teres 100 units Pronator Quadratus 0 units Lumbricals 0 units All injections were done after obtaining appropriate EMG activity and after negative drawback for blood. The patient tolerated the procedure well. Post procedure instructions were given. Return in about 3 months (around 06/07/2019).

## 2019-03-07 NOTE — Patient Instructions (Signed)
PLEASE FEEL FREE TO CALL OUR OFFICE WITH ANY PROBLEMS OR QUESTIONS ([phone])      

## 2019-03-09 DIAGNOSIS — M6389 Disorders of muscle in diseases classified elsewhere, multiple sites: Secondary | ICD-10-CM | POA: Diagnosis not present

## 2019-03-09 DIAGNOSIS — R293 Abnormal posture: Secondary | ICD-10-CM | POA: Diagnosis not present

## 2019-03-09 DIAGNOSIS — Z8673 Personal history of transient ischemic attack (TIA), and cerebral infarction without residual deficits: Secondary | ICD-10-CM | POA: Diagnosis not present

## 2019-03-09 DIAGNOSIS — G8111 Spastic hemiplegia affecting right dominant side: Secondary | ICD-10-CM | POA: Diagnosis not present

## 2019-03-30 DIAGNOSIS — Z8673 Personal history of transient ischemic attack (TIA), and cerebral infarction without residual deficits: Secondary | ICD-10-CM | POA: Diagnosis not present

## 2019-03-30 DIAGNOSIS — M6389 Disorders of muscle in diseases classified elsewhere, multiple sites: Secondary | ICD-10-CM | POA: Diagnosis not present

## 2019-03-30 DIAGNOSIS — G8111 Spastic hemiplegia affecting right dominant side: Secondary | ICD-10-CM | POA: Diagnosis not present

## 2019-03-30 DIAGNOSIS — R293 Abnormal posture: Secondary | ICD-10-CM | POA: Diagnosis not present

## 2019-04-03 DIAGNOSIS — M6389 Disorders of muscle in diseases classified elsewhere, multiple sites: Secondary | ICD-10-CM | POA: Diagnosis not present

## 2019-04-03 DIAGNOSIS — R293 Abnormal posture: Secondary | ICD-10-CM | POA: Diagnosis not present

## 2019-04-03 DIAGNOSIS — Z8673 Personal history of transient ischemic attack (TIA), and cerebral infarction without residual deficits: Secondary | ICD-10-CM | POA: Diagnosis not present

## 2019-04-03 DIAGNOSIS — G8111 Spastic hemiplegia affecting right dominant side: Secondary | ICD-10-CM | POA: Diagnosis not present

## 2019-04-10 DIAGNOSIS — Z8673 Personal history of transient ischemic attack (TIA), and cerebral infarction without residual deficits: Secondary | ICD-10-CM | POA: Diagnosis not present

## 2019-04-10 DIAGNOSIS — R293 Abnormal posture: Secondary | ICD-10-CM | POA: Diagnosis not present

## 2019-04-10 DIAGNOSIS — M6389 Disorders of muscle in diseases classified elsewhere, multiple sites: Secondary | ICD-10-CM | POA: Diagnosis not present

## 2019-04-10 DIAGNOSIS — G8111 Spastic hemiplegia affecting right dominant side: Secondary | ICD-10-CM | POA: Diagnosis not present

## 2019-04-18 DIAGNOSIS — G8111 Spastic hemiplegia affecting right dominant side: Secondary | ICD-10-CM | POA: Diagnosis not present

## 2019-04-18 DIAGNOSIS — Z8673 Personal history of transient ischemic attack (TIA), and cerebral infarction without residual deficits: Secondary | ICD-10-CM | POA: Diagnosis not present

## 2019-04-18 DIAGNOSIS — R293 Abnormal posture: Secondary | ICD-10-CM | POA: Diagnosis not present

## 2019-04-18 DIAGNOSIS — M6389 Disorders of muscle in diseases classified elsewhere, multiple sites: Secondary | ICD-10-CM | POA: Diagnosis not present

## 2019-04-27 DIAGNOSIS — G8111 Spastic hemiplegia affecting right dominant side: Secondary | ICD-10-CM | POA: Diagnosis not present

## 2019-04-27 DIAGNOSIS — R293 Abnormal posture: Secondary | ICD-10-CM | POA: Diagnosis not present

## 2019-04-27 DIAGNOSIS — M6389 Disorders of muscle in diseases classified elsewhere, multiple sites: Secondary | ICD-10-CM | POA: Diagnosis not present

## 2019-04-27 DIAGNOSIS — Z8673 Personal history of transient ischemic attack (TIA), and cerebral infarction without residual deficits: Secondary | ICD-10-CM | POA: Diagnosis not present

## 2019-05-02 DIAGNOSIS — Z23 Encounter for immunization: Secondary | ICD-10-CM | POA: Diagnosis not present

## 2019-05-04 DIAGNOSIS — R293 Abnormal posture: Secondary | ICD-10-CM | POA: Diagnosis not present

## 2019-05-04 DIAGNOSIS — G8111 Spastic hemiplegia affecting right dominant side: Secondary | ICD-10-CM | POA: Diagnosis not present

## 2019-05-04 DIAGNOSIS — M6389 Disorders of muscle in diseases classified elsewhere, multiple sites: Secondary | ICD-10-CM | POA: Diagnosis not present

## 2019-05-04 DIAGNOSIS — Z8673 Personal history of transient ischemic attack (TIA), and cerebral infarction without residual deficits: Secondary | ICD-10-CM | POA: Diagnosis not present

## 2019-05-07 DIAGNOSIS — H9193 Unspecified hearing loss, bilateral: Secondary | ICD-10-CM | POA: Diagnosis not present

## 2019-05-07 DIAGNOSIS — I350 Nonrheumatic aortic (valve) stenosis: Secondary | ICD-10-CM | POA: Diagnosis not present

## 2019-05-07 DIAGNOSIS — J309 Allergic rhinitis, unspecified: Secondary | ICD-10-CM | POA: Diagnosis not present

## 2019-05-07 DIAGNOSIS — I340 Nonrheumatic mitral (valve) insufficiency: Secondary | ICD-10-CM | POA: Diagnosis not present

## 2019-05-07 DIAGNOSIS — J45909 Unspecified asthma, uncomplicated: Secondary | ICD-10-CM | POA: Diagnosis not present

## 2019-05-07 DIAGNOSIS — K5790 Diverticulosis of intestine, part unspecified, without perforation or abscess without bleeding: Secondary | ICD-10-CM | POA: Diagnosis not present

## 2019-05-07 DIAGNOSIS — I69359 Hemiplegia and hemiparesis following cerebral infarction affecting unspecified side: Secondary | ICD-10-CM | POA: Diagnosis not present

## 2019-05-07 DIAGNOSIS — E785 Hyperlipidemia, unspecified: Secondary | ICD-10-CM | POA: Diagnosis not present

## 2019-05-07 DIAGNOSIS — I6389 Other cerebral infarction: Secondary | ICD-10-CM | POA: Diagnosis not present

## 2019-05-07 DIAGNOSIS — I69320 Aphasia following cerebral infarction: Secondary | ICD-10-CM | POA: Diagnosis not present

## 2019-05-07 DIAGNOSIS — I10 Essential (primary) hypertension: Secondary | ICD-10-CM | POA: Diagnosis not present

## 2019-05-07 DIAGNOSIS — G8110 Spastic hemiplegia affecting unspecified side: Secondary | ICD-10-CM | POA: Diagnosis not present

## 2019-05-11 DIAGNOSIS — G8111 Spastic hemiplegia affecting right dominant side: Secondary | ICD-10-CM | POA: Diagnosis not present

## 2019-05-11 DIAGNOSIS — Z8673 Personal history of transient ischemic attack (TIA), and cerebral infarction without residual deficits: Secondary | ICD-10-CM | POA: Diagnosis not present

## 2019-05-11 DIAGNOSIS — R293 Abnormal posture: Secondary | ICD-10-CM | POA: Diagnosis not present

## 2019-05-11 DIAGNOSIS — M6389 Disorders of muscle in diseases classified elsewhere, multiple sites: Secondary | ICD-10-CM | POA: Diagnosis not present

## 2019-05-18 DIAGNOSIS — R293 Abnormal posture: Secondary | ICD-10-CM | POA: Diagnosis not present

## 2019-05-18 DIAGNOSIS — Z8673 Personal history of transient ischemic attack (TIA), and cerebral infarction without residual deficits: Secondary | ICD-10-CM | POA: Diagnosis not present

## 2019-05-18 DIAGNOSIS — G8111 Spastic hemiplegia affecting right dominant side: Secondary | ICD-10-CM | POA: Diagnosis not present

## 2019-05-18 DIAGNOSIS — M6389 Disorders of muscle in diseases classified elsewhere, multiple sites: Secondary | ICD-10-CM | POA: Diagnosis not present

## 2019-05-24 DIAGNOSIS — I69390 Apraxia following cerebral infarction: Secondary | ICD-10-CM | POA: Diagnosis not present

## 2019-05-24 DIAGNOSIS — I69320 Aphasia following cerebral infarction: Secondary | ICD-10-CM | POA: Diagnosis not present

## 2019-05-25 DIAGNOSIS — M6389 Disorders of muscle in diseases classified elsewhere, multiple sites: Secondary | ICD-10-CM | POA: Diagnosis not present

## 2019-05-25 DIAGNOSIS — Z8673 Personal history of transient ischemic attack (TIA), and cerebral infarction without residual deficits: Secondary | ICD-10-CM | POA: Diagnosis not present

## 2019-05-25 DIAGNOSIS — G8111 Spastic hemiplegia affecting right dominant side: Secondary | ICD-10-CM | POA: Diagnosis not present

## 2019-05-25 DIAGNOSIS — R293 Abnormal posture: Secondary | ICD-10-CM | POA: Diagnosis not present

## 2019-05-29 DIAGNOSIS — Z23 Encounter for immunization: Secondary | ICD-10-CM | POA: Diagnosis not present

## 2019-06-08 DIAGNOSIS — G8111 Spastic hemiplegia affecting right dominant side: Secondary | ICD-10-CM | POA: Diagnosis not present

## 2019-06-08 DIAGNOSIS — M6389 Disorders of muscle in diseases classified elsewhere, multiple sites: Secondary | ICD-10-CM | POA: Diagnosis not present

## 2019-06-08 DIAGNOSIS — R293 Abnormal posture: Secondary | ICD-10-CM | POA: Diagnosis not present

## 2019-06-08 DIAGNOSIS — Z8673 Personal history of transient ischemic attack (TIA), and cerebral infarction without residual deficits: Secondary | ICD-10-CM | POA: Diagnosis not present

## 2019-06-13 ENCOUNTER — Encounter: Payer: Medicare Other | Attending: Physical Medicine & Rehabilitation | Admitting: Physical Medicine & Rehabilitation

## 2019-06-13 ENCOUNTER — Encounter: Payer: Medicare Other | Admitting: Physical Medicine & Rehabilitation

## 2019-06-13 ENCOUNTER — Encounter: Payer: Self-pay | Admitting: Physical Medicine & Rehabilitation

## 2019-06-13 ENCOUNTER — Other Ambulatory Visit: Payer: Self-pay

## 2019-06-13 VITALS — BP 142/76 | HR 66 | Temp 97.7°F | Ht 65.0 in | Wt 113.0 lb

## 2019-06-13 DIAGNOSIS — G8111 Spastic hemiplegia affecting right dominant side: Secondary | ICD-10-CM | POA: Diagnosis not present

## 2019-06-13 DIAGNOSIS — G8110 Spastic hemiplegia affecting unspecified side: Secondary | ICD-10-CM | POA: Diagnosis not present

## 2019-06-13 NOTE — Patient Instructions (Signed)
callme

## 2019-06-13 NOTE — Progress Notes (Signed)
Subjective:    Patient ID: [name], female    DOB: April 27, 1940, 79 y.o.   MRN: [id number]  HPI  Here for botox  Pain Inventory Average Pain 2 Pain Right Now 3 My pain is constant and dull  In the last 24 hours, has pain interfered with the following? General activity 1 Relation with others 1 Enjoyment of life 1 What TIME of day is your pain at its worst? night Sleep (in general) Fair  Pain is worse with: na Pain improves with: na Relief from Meds: 0  Mobility use a walker ability to climb steps?  no do you drive?  no use a wheelchair needs help with transfers  Function retired I need assistance with the following:  dressing, meal prep, household duties and shopping  Neuro/Psych trouble walking  Prior Studies Any changes since last visit?  no  Physicians involved in your care Any changes since last visit?  no   Family History  Problem Relation Age of Onset  . Aortic aneurysm Father   . Heart failure Mother    Social History   Socioeconomic History  . Marital status: Married    Spouse name: Dr. Genelle Bal  . Number of children: Not on file  . Years of education: Not on file  . Highest education level: Not on file  Occupational History  . Not on file  Tobacco Use  . Smoking status: Never Smoker  . Smokeless tobacco: Never Used  Substance and Sexual Activity  . Alcohol use: Yes    Alcohol/week: 1.0 standard drinks    Types: 1 Glasses of wine per week    Comment: occasionally  . Drug use: No  . Sexual activity: Not on file  Other Topics Concern  . Not on file  Social History Narrative  . Not on file   Social Determinants of Health   Financial Resource Strain:   . Difficulty of Paying Living Expenses: Not on file  Food Insecurity:   . Worried About Charity fundraiser in the Last Year: Not on file  . Ran Out of Food in the Last Year: Not on file  Transportation Needs:   . Lack of Transportation (Medical): Not on file  . Lack of  Transportation (Non-Medical): Not on file  Physical Activity:   . Days of Exercise per Week: Not on file  . Minutes of Exercise per Session: Not on file  Stress:   . Feeling of Stress : Not on file  Social Connections:   . Frequency of Communication with Friends and Family: Not on file  . Frequency of Social Gatherings with Friends and Family: Not on file  . Attends Religious Services: Not on file  . Active Member of Clubs or Organizations: Not on file  . Attends Archivist Meetings: Not on file  . Marital Status: Not on file   Past Surgical History:  Procedure Laterality Date  . APPENDECTOMY    . BREAST LUMPECTOMY     Right-benign  . BRONCHOSCOPY     Video bronch w/ TBBX 2012  . CESAREAN SECTION     x 4  . TONSILLECTOMY    . VESICOVAGINAL FISTULA CLOSURE W/ TAH     Past Medical History:  Diagnosis Date  . Acute renal insufficiency    due to amphotericin 2012  . Allergic rhinitis, cause unspecified   . Bronchitis, not specified as acute or chronic   . Diverticulosis   . Eosinophilic pneumonia  Hosp 3/16-30/12  . Hypertension   . Irritable bowel   . Other chronic allergic conjunctivitis   . Other chronic sinusitis   . Stroke ([place])    BP (!) 142/76   Pulse 66   Temp 97.7 F (36.5 C)   Ht 5\' 5"  (1.651 m)   Wt 51.3 kg   SpO2 98%   BMI 18.80 kg/m   Opioid Risk Score:   Fall Risk Score:  `1  Depression screen PHQ 2/9  Depression screen [facility] 2/9 11/14/2017 10/06/2015  Decreased Interest 0 0  Down, Depressed, Hopeless 0 1  PHQ - 2 Score 0 1  Altered sleeping - 3  Tired, decreased energy - 0  Change in appetite - 0  Feeling bad or failure about yourself  - 1  Trouble concentrating - 2  Moving slowly or fidgety/restless - 3  Suicidal thoughts - 0  PHQ-9 Score - 10  Difficult doing work/chores - Very difficult     Review of Systems  Constitutional: Negative.   HENT: Negative.   Eyes: Negative.   Respiratory: Negative.   Cardiovascular:  Negative.   Gastrointestinal: Negative.   Endocrine: Negative.   Genitourinary: Negative.   Musculoskeletal: Positive for arthralgias and gait problem.  Skin: Negative.   Allergic/Immunologic: Negative.   Hematological: Negative.   Psychiatric/Behavioral: Negative.   All other systems reviewed and are negative.      Objective:   Physical Exam General: No acute distress HEENT: EOMI, oral membranes moist Cards: reg rate  Chest: normal effort Abdomen: Soft, NT, ND Skin: dry, intact Extremities: no edema  Musc:minorsublux anterior right shoulder Neurological: improving language Alert. DTRs 3+ right upper and right lower extremity  Motor: right central 7 Right upper extremity and right lower extremity: RUE trace to1/5. 1/5 HAD, 1+KE , trace ADF/PF.--stable   Left upper and left lower extremity 4/5. Flexor tone RUE I spersistent--2/4 biceps and pecs, PT/PQtr to 1/4 wrist and finger flexors/thumb, . trace extensor. 1/4RLE tib posterior/gastrocs. Skin: Skin is warm and dry.  Psych: pleasant and up beat.    Assessment & Plan:  Medical Problem List and Plan: 1. 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 This includes bleeding, bruising, infection, excessive weakness, or medication side effects. A REMS form is on file and signed.  Needle: 46mm injectable monopolar needle electrode  Right upper limb Number of units per muscle Pectoralis  Major 75 units Pectoralis Minor 75 units Teres major 50u Latissimus Dorsi 25u Biceps 100 units Brachioradialis 75 units FCR 0 units FCU 0 units FDS 0 units FDP 0 units FPL 0 units Pronator Teres 100 units Pronator Quadratus 0 units Lumbricals 0 units All injections were done after obtaining appropriate EMG activity and after negative drawback for blood. The patient tolerated the procedure well. Post procedure instructions were given. Return in about 3 months (around 09/10/2019) for botox 500u RUE/chest.

## 2019-06-15 DIAGNOSIS — R293 Abnormal posture: Secondary | ICD-10-CM | POA: Diagnosis not present

## 2019-06-15 DIAGNOSIS — M6389 Disorders of muscle in diseases classified elsewhere, multiple sites: Secondary | ICD-10-CM | POA: Diagnosis not present

## 2019-06-15 DIAGNOSIS — Z8673 Personal history of transient ischemic attack (TIA), and cerebral infarction without residual deficits: Secondary | ICD-10-CM | POA: Diagnosis not present

## 2019-06-15 DIAGNOSIS — G8111 Spastic hemiplegia affecting right dominant side: Secondary | ICD-10-CM | POA: Diagnosis not present

## 2019-06-22 DIAGNOSIS — G8111 Spastic hemiplegia affecting right dominant side: Secondary | ICD-10-CM | POA: Diagnosis not present

## 2019-06-22 DIAGNOSIS — R293 Abnormal posture: Secondary | ICD-10-CM | POA: Diagnosis not present

## 2019-06-22 DIAGNOSIS — I69390 Apraxia following cerebral infarction: Secondary | ICD-10-CM | POA: Diagnosis not present

## 2019-06-22 DIAGNOSIS — M6389 Disorders of muscle in diseases classified elsewhere, multiple sites: Secondary | ICD-10-CM | POA: Diagnosis not present

## 2019-06-22 DIAGNOSIS — I69320 Aphasia following cerebral infarction: Secondary | ICD-10-CM | POA: Diagnosis not present

## 2019-06-22 DIAGNOSIS — Z8673 Personal history of transient ischemic attack (TIA), and cerebral infarction without residual deficits: Secondary | ICD-10-CM | POA: Diagnosis not present

## 2019-06-28 DIAGNOSIS — I69320 Aphasia following cerebral infarction: Secondary | ICD-10-CM | POA: Diagnosis not present

## 2019-06-28 DIAGNOSIS — I69390 Apraxia following cerebral infarction: Secondary | ICD-10-CM | POA: Diagnosis not present

## 2019-06-29 DIAGNOSIS — Z8673 Personal history of transient ischemic attack (TIA), and cerebral infarction without residual deficits: Secondary | ICD-10-CM | POA: Diagnosis not present

## 2019-06-29 DIAGNOSIS — G8111 Spastic hemiplegia affecting right dominant side: Secondary | ICD-10-CM | POA: Diagnosis not present

## 2019-06-29 DIAGNOSIS — M6389 Disorders of muscle in diseases classified elsewhere, multiple sites: Secondary | ICD-10-CM | POA: Diagnosis not present

## 2019-06-29 DIAGNOSIS — R293 Abnormal posture: Secondary | ICD-10-CM | POA: Diagnosis not present

## 2019-07-05 DIAGNOSIS — I69320 Aphasia following cerebral infarction: Secondary | ICD-10-CM | POA: Diagnosis not present

## 2019-07-05 DIAGNOSIS — I69390 Apraxia following cerebral infarction: Secondary | ICD-10-CM | POA: Diagnosis not present

## 2019-07-06 DIAGNOSIS — M6389 Disorders of muscle in diseases classified elsewhere, multiple sites: Secondary | ICD-10-CM | POA: Diagnosis not present

## 2019-07-06 DIAGNOSIS — Z8673 Personal history of transient ischemic attack (TIA), and cerebral infarction without residual deficits: Secondary | ICD-10-CM | POA: Diagnosis not present

## 2019-07-06 DIAGNOSIS — G8111 Spastic hemiplegia affecting right dominant side: Secondary | ICD-10-CM | POA: Diagnosis not present

## 2019-07-06 DIAGNOSIS — R293 Abnormal posture: Secondary | ICD-10-CM | POA: Diagnosis not present

## 2019-07-12 DIAGNOSIS — I69320 Aphasia following cerebral infarction: Secondary | ICD-10-CM | POA: Diagnosis not present

## 2019-07-12 DIAGNOSIS — I69390 Apraxia following cerebral infarction: Secondary | ICD-10-CM | POA: Diagnosis not present

## 2019-07-13 DIAGNOSIS — R293 Abnormal posture: Secondary | ICD-10-CM | POA: Diagnosis not present

## 2019-07-13 DIAGNOSIS — Z8673 Personal history of transient ischemic attack (TIA), and cerebral infarction without residual deficits: Secondary | ICD-10-CM | POA: Diagnosis not present

## 2019-07-13 DIAGNOSIS — G8111 Spastic hemiplegia affecting right dominant side: Secondary | ICD-10-CM | POA: Diagnosis not present

## 2019-07-13 DIAGNOSIS — M6389 Disorders of muscle in diseases classified elsewhere, multiple sites: Secondary | ICD-10-CM | POA: Diagnosis not present

## 2019-07-19 DIAGNOSIS — I69320 Aphasia following cerebral infarction: Secondary | ICD-10-CM | POA: Diagnosis not present

## 2019-07-19 DIAGNOSIS — I69390 Apraxia following cerebral infarction: Secondary | ICD-10-CM | POA: Diagnosis not present

## 2019-07-26 DIAGNOSIS — I69390 Apraxia following cerebral infarction: Secondary | ICD-10-CM | POA: Diagnosis not present

## 2019-07-26 DIAGNOSIS — I69320 Aphasia following cerebral infarction: Secondary | ICD-10-CM | POA: Diagnosis not present

## 2019-07-27 DIAGNOSIS — R293 Abnormal posture: Secondary | ICD-10-CM | POA: Diagnosis not present

## 2019-07-27 DIAGNOSIS — Z8673 Personal history of transient ischemic attack (TIA), and cerebral infarction without residual deficits: Secondary | ICD-10-CM | POA: Diagnosis not present

## 2019-07-27 DIAGNOSIS — G8111 Spastic hemiplegia affecting right dominant side: Secondary | ICD-10-CM | POA: Diagnosis not present

## 2019-07-27 DIAGNOSIS — M6389 Disorders of muscle in diseases classified elsewhere, multiple sites: Secondary | ICD-10-CM | POA: Diagnosis not present

## 2019-08-02 DIAGNOSIS — I69320 Aphasia following cerebral infarction: Secondary | ICD-10-CM | POA: Diagnosis not present

## 2019-08-02 DIAGNOSIS — I69390 Apraxia following cerebral infarction: Secondary | ICD-10-CM | POA: Diagnosis not present

## 2019-08-03 DIAGNOSIS — G8111 Spastic hemiplegia affecting right dominant side: Secondary | ICD-10-CM | POA: Diagnosis not present

## 2019-08-03 DIAGNOSIS — Z8673 Personal history of transient ischemic attack (TIA), and cerebral infarction without residual deficits: Secondary | ICD-10-CM | POA: Diagnosis not present

## 2019-08-03 DIAGNOSIS — R293 Abnormal posture: Secondary | ICD-10-CM | POA: Diagnosis not present

## 2019-08-03 DIAGNOSIS — M6389 Disorders of muscle in diseases classified elsewhere, multiple sites: Secondary | ICD-10-CM | POA: Diagnosis not present

## 2019-08-09 DIAGNOSIS — I69390 Apraxia following cerebral infarction: Secondary | ICD-10-CM | POA: Diagnosis not present

## 2019-08-09 DIAGNOSIS — I69320 Aphasia following cerebral infarction: Secondary | ICD-10-CM | POA: Diagnosis not present

## 2019-08-10 DIAGNOSIS — Z8673 Personal history of transient ischemic attack (TIA), and cerebral infarction without residual deficits: Secondary | ICD-10-CM | POA: Diagnosis not present

## 2019-08-10 DIAGNOSIS — M6389 Disorders of muscle in diseases classified elsewhere, multiple sites: Secondary | ICD-10-CM | POA: Diagnosis not present

## 2019-08-10 DIAGNOSIS — R293 Abnormal posture: Secondary | ICD-10-CM | POA: Diagnosis not present

## 2019-08-10 DIAGNOSIS — G8111 Spastic hemiplegia affecting right dominant side: Secondary | ICD-10-CM | POA: Diagnosis not present

## 2019-08-17 DIAGNOSIS — G8111 Spastic hemiplegia affecting right dominant side: Secondary | ICD-10-CM | POA: Diagnosis not present

## 2019-08-17 DIAGNOSIS — Z8673 Personal history of transient ischemic attack (TIA), and cerebral infarction without residual deficits: Secondary | ICD-10-CM | POA: Diagnosis not present

## 2019-08-17 DIAGNOSIS — M6389 Disorders of muscle in diseases classified elsewhere, multiple sites: Secondary | ICD-10-CM | POA: Diagnosis not present

## 2019-08-17 DIAGNOSIS — R293 Abnormal posture: Secondary | ICD-10-CM | POA: Diagnosis not present

## 2019-08-20 ENCOUNTER — Ambulatory Visit: Payer: Medicare Other | Admitting: Neurology

## 2019-08-23 NOTE — Telephone Encounter (Signed)
Opened in error

## 2019-08-24 DIAGNOSIS — R293 Abnormal posture: Secondary | ICD-10-CM | POA: Diagnosis not present

## 2019-08-24 DIAGNOSIS — G8111 Spastic hemiplegia affecting right dominant side: Secondary | ICD-10-CM | POA: Diagnosis not present

## 2019-08-24 DIAGNOSIS — Z8673 Personal history of transient ischemic attack (TIA), and cerebral infarction without residual deficits: Secondary | ICD-10-CM | POA: Diagnosis not present

## 2019-08-24 DIAGNOSIS — M6389 Disorders of muscle in diseases classified elsewhere, multiple sites: Secondary | ICD-10-CM | POA: Diagnosis not present

## 2019-08-30 DIAGNOSIS — I69320 Aphasia following cerebral infarction: Secondary | ICD-10-CM | POA: Diagnosis not present

## 2019-08-30 DIAGNOSIS — I69390 Apraxia following cerebral infarction: Secondary | ICD-10-CM | POA: Diagnosis not present

## 2019-09-12 ENCOUNTER — Encounter: Payer: Medicare Other | Admitting: Physical Medicine & Rehabilitation

## 2019-09-19 ENCOUNTER — Other Ambulatory Visit: Payer: Self-pay

## 2019-09-19 ENCOUNTER — Encounter: Payer: Medicare Other | Attending: Physical Medicine & Rehabilitation | Admitting: Physical Medicine & Rehabilitation

## 2019-09-19 ENCOUNTER — Encounter: Payer: Self-pay | Admitting: Physical Medicine & Rehabilitation

## 2019-09-19 VITALS — BP 146/77 | HR 70 | Ht 65.0 in | Wt 116.0 lb

## 2019-09-19 DIAGNOSIS — G8111 Spastic hemiplegia affecting right dominant side: Secondary | ICD-10-CM | POA: Insufficient documentation

## 2019-09-19 DIAGNOSIS — G8110 Spastic hemiplegia affecting unspecified side: Secondary | ICD-10-CM | POA: Diagnosis not present

## 2019-09-19 NOTE — Patient Instructions (Signed)
PLEASE FEEL FREE TO CALL OUR OFFICE WITH ANY PROBLEMS OR QUESTIONS ([phone])      

## 2019-09-19 NOTE — Progress Notes (Signed)
Botox Injection for spasticity of upper extremity using needle EMG guidance Indication: Spastic hemiplegia, dominant side (HCC)   Dilution: 100 Units/ml        Total Units Injected: 500 Indication: Severe spasticity which interferes with ADL,mobility and/or  hygiene and is unresponsive to medication management and other conservative care Informed consent was obtained after describing risks and benefits of the procedure with the patient. This includes bleeding, bruising, infection, excessive weakness, or medication side effects. A REMS form is on file and signed.  Needle: 61mm injectable monopolar needle electrode    Number of units per muscle Pectoralis Major 0 units Pectoralis Minor 0 units Biceps 100 units Brachioradialis 100 units FCR 10 units FCU 10 units FDS 80 units FDP 80- units FPL 0 units Palmaris Longus 0 units Pronator Teres 120 units Pronator Quadratus 0 units Lumbricals 0 units All injections were done after obtaining appropriate EMG activity and after negative drawback for blood. The patient tolerated the procedure well. Post procedure instructions were given. Return in about 3 months (around 12/20/2019) for botox RUE 500u.

## 2019-09-20 ENCOUNTER — Encounter: Payer: Self-pay | Admitting: Neurology

## 2019-09-20 ENCOUNTER — Ambulatory Visit (INDEPENDENT_AMBULATORY_CARE_PROVIDER_SITE_OTHER): Payer: Medicare Other | Admitting: Neurology

## 2019-09-20 VITALS — BP 142/66 | HR 58 | Ht 65.0 in | Wt 115.0 lb

## 2019-09-20 DIAGNOSIS — Z8673 Personal history of transient ischemic attack (TIA), and cerebral infarction without residual deficits: Secondary | ICD-10-CM

## 2019-09-20 DIAGNOSIS — G8110 Spastic hemiplegia affecting unspecified side: Secondary | ICD-10-CM | POA: Diagnosis not present

## 2019-09-20 DIAGNOSIS — I69390 Apraxia following cerebral infarction: Secondary | ICD-10-CM | POA: Diagnosis not present

## 2019-09-20 DIAGNOSIS — I69320 Aphasia following cerebral infarction: Secondary | ICD-10-CM | POA: Diagnosis not present

## 2019-09-20 NOTE — Progress Notes (Signed)
[facility] [address] [place]. [place] [address] [phone]       OFFICE FOLLOW-UP NOTE  [name] Date of Birth:  [birth year] Medical Record Number:  [id number]   HPI: [name] is a 80 year pleasant Caucasian lady seen today for first office follow-up visit for hospital admission for intracerebral hemorrhage in April 2017. [name] is an 79 y.o. female with a history of hypertension, brought to the emergency room in Code stroke status following acute onset of right-sided weakness and speech difficulty. She was last known well at 5:20 PM today. She has no previous history of stroke or TIA. CT scan of her head with a large left basal ganglia hemorrhage with no extension into left lateral ventricle. Blood pressure was 168/89. Patient was unable to speak and was neglecting her right side, with a gaze to the left. She had no voluntary movement of right extremities. NIH stroke score was 25. LSN: 5:20 PM on 08/01/2015 tPA Given: No: Acute ICH  she was admitted to intensive care unit and blood pressure was tightly controlled. MRI scan of the brain done on 08/02/15 showed increased size of the intraparenchymal hemorrhage centered around left basal ganglia now measuring 5.6 x 3.3 x 3.6 cm for an estimated volume of 33 cubic cc. No associated left to right midline shift. She was closely neurologically monitored and remained stable with follow-up serial imaging showing stable appearance. She remained aphasic with dense right hemiplegia on exam. She was seen by physical occupational therapy as well as rehabilitation medicine for consult and was transferred to inpatient rehabilitation. She had urinary tract infection as well as some in the [place] abnormalities that but gradually made improvement. She was transferred to pain even for rehabilitation but is currently they're in the skilled facility. She is still getting physical occupational speech therapy daily but she has  unfortunately not been significant improvement. She is able to speak short words and sentences but her comprehension has improved but language and hasn't not improved so much. She still has no significant strength on the right side and has dense hemiplegia. She is able to stand with Lexapro and can help with transfers but is not able to walk a whole lot. She has able to swallow better and hyperactive as come out but she did husband feels she does not eat enough. She had some spasms in her right leg recently and saw Dr. Tessa Lerner who added gabapentin which seems to be helping. The patient's husband plans to move into an apartment in [place] and wants his wife to move up there soon. Blood pressure has remained in the [id number] systolic range and could do better. She is getting baclofen 10 mg 3 times daily for spasticity. Update 05/03/2016 : She returns for follow-up after last visit with me 6 months ago. She is accompanied by husband Dr. London Pepper. They have now moved into the independent living apartment. Patient has 24-hour caregiver during the day. She is not able to walk with a hemiwalker with the need and a foot brace. She requires close supervision however. There have been no falls or injuries. She has noticed some benefit in her muscle cramps and spasms after addition of baclofen to gabapentin. She is slowly tolerating increasing doses of baclofen and currently takes 20 mg and 10 mg alternately a total of 4 times a day. She is also gotten one dose of Botox injection in the right upper extremity by Dr. Tessa Lerner but they have not been  satisfied with the benefit yet. Her blood pressure has been well controlled and today it is 140/82. She had an episode a few weeks ago when she was working on an exercise bicycle at home with her aide when she couldn't breathe and felt right upper extremity was heavy. This did not last long. She was fully aware of her surroundings and did not have any seizure-like activity. Patient did  have an abnormal EEG when she was hospitalized for intracerebral hemorrhage and the family wonders whether this could be seizure related. She is on gabapentin but on a low-dose of 100 mg 4 times daily. She continues to have expressive language difficulties but is able to communicate with some inference and there has been a slow improvement she still has significant right hemiplegia with spasticity but is now able to walk with a hemiwalker with the knee and ankle brace with some help. Update 06/28/2017 : She returns for follow-up after last visit 1 year ago. She is accompanied by her husband. Patient continues to live at [facility] independent living apartment. He continues to have difficulty with speech and walking though her speech may be slightly better. She can speak short sentences at times. She continues to receive this port injections into her right arm but she and her husband feel that Botox was working better. She is currently on baclofen which is being tapered off. Patient has not tried Zanaflex yet for spasticity. She wears a right arm and foot brace and walks with the hemiwalker. She has not had any falls or injuries. She was seen in the ER a few weeks ago with urinary retention. Remains on Lipitor which is tolerating well without any side effects. Her blood pressure is well controlled.  Update 09/20/2019: She returns for follow-up after last visit a year ago.  She is accompanied by her husband.  She continues to have spastic right hemiplegia.  She is able to ambulate with a knee brace and right foot AFO.  She is getting Botox injections in the right upper extremity by Dr. Tessa Lerner with only partial benefit.  She also gets electrical stimulation at [facility], physical therapist which also helped somewhat.  She is able to walk 40 feet on most days.  She also does some resistance band exercises 3 days a week.  She is on a small dose of baclofen but was unable to tolerate a higher dose due to sedation side  effects as well as with Zanaflex she had the same side effects.  Last LDL cholesterol was optimal at 73 mg percent with total cholesterol 150.  She is careful and walks with a hemiwalker and has had no falls or injuries.  She has no new complaints.  ROS:   14 system review of systems is positive for constipation, murmur, decreased concentration, passing out, facial drooping, speech difficulty, weakness, numbness, joint pain, urinary retention ait difficulty and all other systems negative  PMH:  Past Medical History:  Diagnosis Date  . Acute renal insufficiency    due to amphotericin 2012  . Allergic rhinitis, cause unspecified   . Bronchitis, not specified as acute or chronic   . Diverticulosis   . Eosinophilic pneumonia    [facility] 3/16-30/12  . Hypertension   . Irritable bowel   . Other chronic allergic conjunctivitis   . Other chronic sinusitis   . Stroke [facility])     Social History:  Social History   Socioeconomic History  . Marital status: Married    Spouse name: Dr. Herbie [place]  [place]  . Number of children: Not on file  . Years of education: Not on file  . Highest education level: Not on file  Occupational History  . Not on file  Tobacco Use  . Smoking status: Never Smoker  . Smokeless tobacco: Never Used  Substance and Sexual Activity  . Alcohol use: Yes    Alcohol/week: 1.0 standard drinks    Types: 1 Glasses of wine per week    Comment: occasionally  . Drug use: No  . Sexual activity: Not on file  Other Topics Concern  . Not on file  Social History Narrative  . Not on file   Social Determinants of Health   Financial Resource Strain:   . Difficulty of Paying Living Expenses:   Food Insecurity:   . Worried About Charity fundraiser in the Last Year:   . Arboriculturist in the Last Year:   Transportation Needs:   . Film/video editor (Medical):   Marland Kitchen Lack of Transportation (Non-Medical):   Physical Activity:   . Days of Exercise per Week:   . Minutes of Exercise  per Session:   Stress:   . Feeling of Stress :   Social Connections:   . Frequency of Communication with Friends and Family:   . Frequency of Social Gatherings with Friends and Family:   . Attends Religious Services:   . Active Member of Clubs or Organizations:   . Attends Archivist Meetings:   Marland Kitchen Marital Status:   Intimate Partner Violence:   . Fear of Current or Ex-Partner:   . Emotionally Abused:   Marland Kitchen Physically Abused:   . Sexually Abused:     Medications:   Current Outpatient Medications on File Prior to Visit  Medication Sig Dispense Refill  . acetaminophen (TYLENOL) 325 MG tablet Take 650 mg by mouth every 6 (six) hours as needed.    Marland Kitchen amLODipine (NORVASC) 2.5 MG tablet     . atorvastatin (LIPITOR) 20 MG tablet Take 20 mg by mouth at bedtime.   3  . baclofen (LIORESAL) 10 MG tablet Take 10 mg by mouth 4 (four) times daily. 5 mg TID and 10 mg qhs    . BIOTIN 5000 PO Take by mouth.    . Calcium Polycarbophil (FIBER-CAPS PO) Take by mouth.    . Cholecalciferol (VITAMIN D) 2000 units tablet Take by mouth daily.     . diphenhydramine-acetaminophen (TYLENOL PM) 25-500 MG TABS tablet Take 1 tablet by mouth at bedtime as needed.    . fexofenadine (ALLEGRA) 180 MG tablet Take 180 mg by mouth daily.    Marland Kitchen losartan (COZAAR) 100 MG tablet Take 100 mg by mouth every morning.   3  . LUMIGAN 0.01 % SOLN     . Multiple Vitamin (MULTIVITAMIN) tablet Take 1 tablet by mouth daily.    . RESTASIS 0.05 % ophthalmic emulsion     . sennosides-docusate sodium (SENOKOT-S) 8.6-50 MG tablet Take 1 tablet by mouth daily.     No current facility-administered medications on file prior to visit.    Allergies:   Allergies  Allergen Reactions  . Penicillins Hives and Rash    Has patient had a PCN reaction causing immediate rash, facial/tongue/throat swelling, SOB or lightheadedness with hypotension: Yes Has patient had a PCN reaction causing severe rash involving mucus membranes or skin  necrosis: No Has patient had a PCN reaction that required hospitalization unknown Has patient had a PCN reaction occurring within the last  10 years: No If all of the above answers are "NO", then may proceed with Cephalosporin use.    Physical Exam General: Frail elderly Caucasian lady seated, in no evident distress Head: head normocephalic and atraumatic.  Neck: supple with no carotid or supraclavicular bruits Cardiovascular: regular rate and rhythm, soft ejection systolicmurmur Musculoskeletal: no deformity Skin:  no rash/petichiae Vascular:  Normal pulses all extremities Vitals:   09/20/19 1348  BP: (!) 142/66  Pulse: (!) 58   Neurologic Exam Mental Status: Awake and fully alert. Moderate   expressive aphasia and can speak only short sentences with word hesitancy and dysfluency. Able to name and comprehend much better.. Mood and affect appropriate.  Cranial Nerves: Fundoscopic exam not done. Pupils equal, briskly reactive to light. Extraocular movements full without nystagmus. Visual fields Decreased blink to threat on the right to confrontation. Hearing intact. Facial sensation intact. Moderate right lower Face weakness.. Tongue, palate moves normally and symmetrically.  Motor: Normal bulk and tone. Normal strength in all tested extremity muscles on the left side but dense right hemiplegia with 0/5 strength. Spasticity in the right leg with increased tone  .Rt foot drop and right hand nonfixed flexion contractures with atrophy. The right foot brace and right arm brace. Sensory.: intact to touch ,pinprick .position and vibratory sensation.  Coordination: Rapid alternating movements normal in left extremities. And cannot test on the right side  Gait and Station: Not tested as patient unable to walk without hemiwalker Reflexes: 2+ and asymmetric brisker on the right. Toes downgoing.     Modified Rankin 3   ASSESSMENT: 79 year old  Caucasian lady with hypertensive left basal ganglia  hemorrhage in April 2017 who has significant residual expressive aphasia and spastic dense right hemiplegia     PLAN: I had a long discussion with the patient and her husband Dr. London Pepper about her significant post stroke spasticity and discussed treatment options and answered questions.  I recommend we stay on the current dose of baclofen and she has not been able to tolerate a higher dose..  Continue follow-up with Dr. Tessa Lerner for Botox injections and continue ongoing physical therapy and to use her right knee brace as well as AFO while walking.  Discussed with physical therapy and Dr. Tessa Lerner if she can benefit with the less rigid and stiff knee brace.  Check screening follow-up carotid ultrasound study. Continue to maintain strict control of hypertension with blood pressure goal below 130/90 and diabetes with hemoglobin A1c goal below 6.5% and lipids with LDL cholesterol goal below 70 mg percent.  She will return for follow-up in the future in a year or call earlier if necessary.ing this 30 minute visit was spent on counseling,explanation of diagnosis of aphasia and post stroke spasticity, planning of further management, discussion with patient and family and coordination of care Antony Contras, MD Medical Director [facility] Pager: [phone] 09/20/2019 6:05 PM  Note: This document was prepared with digital dictation and possible smart phrase technology. Any transcriptional errors that result from this process are unintentional

## 2019-09-20 NOTE — Patient Instructions (Signed)
I had a long discussion with the patient and her husband Dr. London Pepper about her significant post stroke spasticity and discussed treatment options and answered questions.  I recommend we stay on the current dose of baclofen and she has not been able to tolerate a higher dose..  Continue follow-up with Dr. Tessa Lerner for Botox injections and continue ongoing physical therapy and to use her right knee brace as well as AFO while walking.  Discussed with physical therapy and Dr. Tessa Lerner if she can benefit with the less rigid and stiff knee brace.  Check screening follow-up carotid ultrasound study. Continue to maintain strict control of hypertension with blood pressure goal below 130/90 and diabetes with hemoglobin A1c goal below 6.5% and lipids with LDL cholesterol goal below 70 mg percent.  She will return for follow-up in the future in a year or call earlier if necessary.

## 2019-09-27 DIAGNOSIS — I69320 Aphasia following cerebral infarction: Secondary | ICD-10-CM | POA: Diagnosis not present

## 2019-09-27 DIAGNOSIS — I69390 Apraxia following cerebral infarction: Secondary | ICD-10-CM | POA: Diagnosis not present

## 2019-10-04 DIAGNOSIS — I69390 Apraxia following cerebral infarction: Secondary | ICD-10-CM | POA: Diagnosis not present

## 2019-10-04 DIAGNOSIS — I69320 Aphasia following cerebral infarction: Secondary | ICD-10-CM | POA: Diagnosis not present

## 2019-10-09 ENCOUNTER — Ambulatory Visit (HOSPITAL_COMMUNITY): Payer: Medicare Other

## 2019-10-12 ENCOUNTER — Other Ambulatory Visit: Payer: Self-pay

## 2019-10-12 ENCOUNTER — Ambulatory Visit (HOSPITAL_COMMUNITY)
Admission: RE | Admit: 2019-10-12 | Discharge: 2019-10-12 | Disposition: A | Discharge status: Home / Self Care | Payer: Medicare Other | Source: Ambulatory Visit | Attending: Neurology | Admitting: Neurology

## 2019-10-12 DIAGNOSIS — G8110 Spastic hemiplegia affecting unspecified side: Secondary | ICD-10-CM | POA: Diagnosis present

## 2019-10-12 DIAGNOSIS — Z8673 Personal history of transient ischemic attack (TIA), and cerebral infarction without residual deficits: Secondary | ICD-10-CM

## 2019-10-12 NOTE — Progress Notes (Signed)
Carotid duplex has been completed.   Preliminary results in CV Proc.   Abram Sander 10/12/2019 2:35 PM

## 2019-10-18 DIAGNOSIS — I69320 Aphasia following cerebral infarction: Secondary | ICD-10-CM | POA: Diagnosis not present

## 2019-10-18 DIAGNOSIS — I69390 Apraxia following cerebral infarction: Secondary | ICD-10-CM | POA: Diagnosis not present

## 2019-10-19 NOTE — Progress Notes (Signed)
Kindly inform the patient her carotid ultrasound study showed no significant blockages of either carotid arteries in the neck

## 2019-10-24 ENCOUNTER — Telehealth: Payer: Self-pay

## 2019-10-24 NOTE — Telephone Encounter (Signed)
-----   Message from Garvin Fila, MD sent at 10/19/2019 11:06 AM EDT ----- Mitchell Heir inform the patient her carotid ultrasound study showed no significant blockages of either carotid arteries in the neck

## 2019-10-24 NOTE — Telephone Encounter (Signed)
I called pts husband [place],Robert that per Dr Leonie Man note hher carotid ultrasound study showed no significant blockages of either carotid arteries in the neck. PTs husband verbalized understanding.

## 2019-10-25 DIAGNOSIS — I69320 Aphasia following cerebral infarction: Secondary | ICD-10-CM | POA: Diagnosis not present

## 2019-10-25 DIAGNOSIS — I69390 Apraxia following cerebral infarction: Secondary | ICD-10-CM | POA: Diagnosis not present

## 2019-11-01 DIAGNOSIS — I69320 Aphasia following cerebral infarction: Secondary | ICD-10-CM | POA: Diagnosis not present

## 2019-11-01 DIAGNOSIS — I69390 Apraxia following cerebral infarction: Secondary | ICD-10-CM | POA: Diagnosis not present

## 2019-11-08 DIAGNOSIS — I69320 Aphasia following cerebral infarction: Secondary | ICD-10-CM | POA: Diagnosis not present

## 2019-11-08 DIAGNOSIS — I69390 Apraxia following cerebral infarction: Secondary | ICD-10-CM | POA: Diagnosis not present

## 2019-12-10 DIAGNOSIS — I6389 Other cerebral infarction: Secondary | ICD-10-CM | POA: Diagnosis not present

## 2019-12-10 DIAGNOSIS — I69328 Other speech and language deficits following cerebral infarction: Secondary | ICD-10-CM | POA: Diagnosis not present

## 2019-12-12 DIAGNOSIS — I69328 Other speech and language deficits following cerebral infarction: Secondary | ICD-10-CM | POA: Diagnosis not present

## 2019-12-12 DIAGNOSIS — I6389 Other cerebral infarction: Secondary | ICD-10-CM | POA: Diagnosis not present

## 2019-12-17 DIAGNOSIS — K921 Melena: Secondary | ICD-10-CM | POA: Diagnosis not present

## 2019-12-17 DIAGNOSIS — E785 Hyperlipidemia, unspecified: Secondary | ICD-10-CM | POA: Diagnosis not present

## 2019-12-17 DIAGNOSIS — I6389 Other cerebral infarction: Secondary | ICD-10-CM | POA: Diagnosis not present

## 2019-12-17 DIAGNOSIS — I69328 Other speech and language deficits following cerebral infarction: Secondary | ICD-10-CM | POA: Diagnosis not present

## 2019-12-17 DIAGNOSIS — E559 Vitamin D deficiency, unspecified: Secondary | ICD-10-CM | POA: Diagnosis not present

## 2019-12-17 DIAGNOSIS — I10 Essential (primary) hypertension: Secondary | ICD-10-CM | POA: Diagnosis not present

## 2019-12-19 DIAGNOSIS — I6389 Other cerebral infarction: Secondary | ICD-10-CM | POA: Diagnosis not present

## 2019-12-19 DIAGNOSIS — I69328 Other speech and language deficits following cerebral infarction: Secondary | ICD-10-CM | POA: Diagnosis not present

## 2019-12-21 DIAGNOSIS — G8110 Spastic hemiplegia affecting unspecified side: Secondary | ICD-10-CM | POA: Diagnosis not present

## 2019-12-21 DIAGNOSIS — I340 Nonrheumatic mitral (valve) insufficiency: Secondary | ICD-10-CM | POA: Diagnosis not present

## 2019-12-21 DIAGNOSIS — R809 Proteinuria, unspecified: Secondary | ICD-10-CM | POA: Diagnosis not present

## 2019-12-21 DIAGNOSIS — I350 Nonrheumatic aortic (valve) stenosis: Secondary | ICD-10-CM | POA: Diagnosis not present

## 2019-12-21 DIAGNOSIS — K921 Melena: Secondary | ICD-10-CM | POA: Diagnosis not present

## 2019-12-21 DIAGNOSIS — I6389 Other cerebral infarction: Secondary | ICD-10-CM | POA: Diagnosis not present

## 2019-12-21 DIAGNOSIS — I69359 Hemiplegia and hemiparesis following cerebral infarction affecting unspecified side: Secondary | ICD-10-CM | POA: Diagnosis not present

## 2019-12-21 DIAGNOSIS — I10 Essential (primary) hypertension: Secondary | ICD-10-CM | POA: Diagnosis not present

## 2019-12-21 DIAGNOSIS — I517 Cardiomegaly: Secondary | ICD-10-CM | POA: Diagnosis not present

## 2019-12-21 DIAGNOSIS — J45909 Unspecified asthma, uncomplicated: Secondary | ICD-10-CM | POA: Diagnosis not present

## 2019-12-21 DIAGNOSIS — Z0000 Encounter for general adult medical examination without abnormal findings: Secondary | ICD-10-CM | POA: Diagnosis not present

## 2019-12-21 DIAGNOSIS — Z1331 Encounter for screening for depression: Secondary | ICD-10-CM | POA: Diagnosis not present

## 2019-12-24 DIAGNOSIS — I6389 Other cerebral infarction: Secondary | ICD-10-CM | POA: Diagnosis not present

## 2019-12-24 DIAGNOSIS — I69328 Other speech and language deficits following cerebral infarction: Secondary | ICD-10-CM | POA: Diagnosis not present

## 2019-12-25 ENCOUNTER — Other Ambulatory Visit (HOSPITAL_COMMUNITY): Payer: Self-pay | Admitting: Internal Medicine

## 2019-12-25 DIAGNOSIS — I350 Nonrheumatic aortic (valve) stenosis: Secondary | ICD-10-CM

## 2019-12-26 ENCOUNTER — Encounter: Payer: Self-pay | Admitting: Physical Medicine & Rehabilitation

## 2019-12-26 ENCOUNTER — Encounter: Payer: Medicare Other | Attending: Physical Medicine & Rehabilitation | Admitting: Physical Medicine & Rehabilitation

## 2019-12-26 ENCOUNTER — Other Ambulatory Visit: Payer: Self-pay

## 2019-12-26 VITALS — BP 163/104 | HR 70 | Temp 98.3°F | Ht 65.0 in | Wt 115.0 lb

## 2019-12-26 DIAGNOSIS — G8110 Spastic hemiplegia affecting unspecified side: Secondary | ICD-10-CM

## 2019-12-26 DIAGNOSIS — G8111 Spastic hemiplegia affecting right dominant side: Secondary | ICD-10-CM | POA: Diagnosis not present

## 2019-12-26 NOTE — Progress Notes (Signed)
Botox Injection for spasticity of upper extremity using needle EMG guidance Indication: Spastic hemiplegia, dominant side (HCC) G81.10  Dilution: 100 Units/ml        Total Units Injected: 500 u Indication: Severe spasticity which interferes with ADL,mobility and/or  hygiene and is unresponsive to medication management and other conservative care Informed consent was obtained after describing risks and benefits of the procedure with the patient. This includes bleeding, bruising, infection, excessive weakness, or medication side effects. A REMS form is on file and signed.  Needle: 47mm injectable monopolar needle electrode   Right Number of units per muscle Latissimus dorsi 75u Pectoralis Major 125 units Pectoralis Minor 25 units Biceps 125 units Brachioradialis 50 units FCR 0 units FCU 0 units FDS 25 units FDP 25 units FPL 0 units Palmaris Longus 0 units Pronator Teres 50 units Pronator Quadratus 0 units Lumbricals 0 units All injections were done after obtaining appropriate EMG activity and after negative drawback for blood. The patient tolerated the procedure well. Post procedure instructions were given. Return in about 3 months (around 03/26/2020) for botox 500u .

## 2019-12-26 NOTE — Patient Instructions (Signed)
PLEASE FEEL FREE TO CALL OUR OFFICE WITH ANY PROBLEMS OR QUESTIONS ([phone])      

## 2019-12-28 DIAGNOSIS — I69328 Other speech and language deficits following cerebral infarction: Secondary | ICD-10-CM | POA: Diagnosis not present

## 2019-12-28 DIAGNOSIS — I6389 Other cerebral infarction: Secondary | ICD-10-CM | POA: Diagnosis not present

## 2019-12-31 DIAGNOSIS — I69328 Other speech and language deficits following cerebral infarction: Secondary | ICD-10-CM | POA: Diagnosis not present

## 2019-12-31 DIAGNOSIS — I6389 Other cerebral infarction: Secondary | ICD-10-CM | POA: Diagnosis not present

## 2020-01-02 DIAGNOSIS — I69328 Other speech and language deficits following cerebral infarction: Secondary | ICD-10-CM | POA: Diagnosis not present

## 2020-01-02 DIAGNOSIS — I6389 Other cerebral infarction: Secondary | ICD-10-CM | POA: Diagnosis not present

## 2020-01-09 DIAGNOSIS — I69328 Other speech and language deficits following cerebral infarction: Secondary | ICD-10-CM | POA: Diagnosis not present

## 2020-01-09 DIAGNOSIS — I6389 Other cerebral infarction: Secondary | ICD-10-CM | POA: Diagnosis not present

## 2020-01-10 ENCOUNTER — Other Ambulatory Visit: Payer: Self-pay

## 2020-01-10 ENCOUNTER — Ambulatory Visit (HOSPITAL_COMMUNITY): Payer: Medicare Other | Attending: Internal Medicine

## 2020-01-10 DIAGNOSIS — I350 Nonrheumatic aortic (valve) stenosis: Secondary | ICD-10-CM

## 2020-01-10 LAB — ECHOCARDIOGRAM COMPLETE
AR max vel: 0.87 cm2
AV Area VTI: 0.89 cm2
AV Area mean vel: 0.76 cm2
AV Mean grad: 33 mmHg
AV Peak grad: 51.3 mmHg
Ao pk vel: 3.58 m/s
Area-P 1/2: 2.62 cm2
P 1/2 time: 670 msec
S' Lateral: 2.9 cm

## 2020-01-11 DIAGNOSIS — I6389 Other cerebral infarction: Secondary | ICD-10-CM | POA: Diagnosis not present

## 2020-01-11 DIAGNOSIS — I69328 Other speech and language deficits following cerebral infarction: Secondary | ICD-10-CM | POA: Diagnosis not present

## 2020-01-16 DIAGNOSIS — I69328 Other speech and language deficits following cerebral infarction: Secondary | ICD-10-CM | POA: Diagnosis not present

## 2020-01-16 DIAGNOSIS — I6389 Other cerebral infarction: Secondary | ICD-10-CM | POA: Diagnosis not present

## 2020-01-18 DIAGNOSIS — I6389 Other cerebral infarction: Secondary | ICD-10-CM | POA: Diagnosis not present

## 2020-01-18 DIAGNOSIS — I69328 Other speech and language deficits following cerebral infarction: Secondary | ICD-10-CM | POA: Diagnosis not present

## 2020-01-21 DIAGNOSIS — I69328 Other speech and language deficits following cerebral infarction: Secondary | ICD-10-CM | POA: Diagnosis not present

## 2020-01-21 DIAGNOSIS — I6389 Other cerebral infarction: Secondary | ICD-10-CM | POA: Diagnosis not present

## 2020-01-23 DIAGNOSIS — I69328 Other speech and language deficits following cerebral infarction: Secondary | ICD-10-CM | POA: Diagnosis not present

## 2020-01-23 DIAGNOSIS — I6389 Other cerebral infarction: Secondary | ICD-10-CM | POA: Diagnosis not present

## 2020-01-28 DIAGNOSIS — I69328 Other speech and language deficits following cerebral infarction: Secondary | ICD-10-CM | POA: Diagnosis not present

## 2020-01-28 DIAGNOSIS — I6389 Other cerebral infarction: Secondary | ICD-10-CM | POA: Diagnosis not present

## 2020-01-30 DIAGNOSIS — I6389 Other cerebral infarction: Secondary | ICD-10-CM | POA: Diagnosis not present

## 2020-01-30 DIAGNOSIS — I69328 Other speech and language deficits following cerebral infarction: Secondary | ICD-10-CM | POA: Diagnosis not present

## 2020-02-04 DIAGNOSIS — I6389 Other cerebral infarction: Secondary | ICD-10-CM | POA: Diagnosis not present

## 2020-02-04 DIAGNOSIS — I69328 Other speech and language deficits following cerebral infarction: Secondary | ICD-10-CM | POA: Diagnosis not present

## 2020-02-06 DIAGNOSIS — I6389 Other cerebral infarction: Secondary | ICD-10-CM | POA: Diagnosis not present

## 2020-02-06 DIAGNOSIS — I69328 Other speech and language deficits following cerebral infarction: Secondary | ICD-10-CM | POA: Diagnosis not present

## 2020-02-11 DIAGNOSIS — I69328 Other speech and language deficits following cerebral infarction: Secondary | ICD-10-CM | POA: Diagnosis not present

## 2020-02-11 DIAGNOSIS — I6389 Other cerebral infarction: Secondary | ICD-10-CM | POA: Diagnosis not present

## 2020-02-13 DIAGNOSIS — I6389 Other cerebral infarction: Secondary | ICD-10-CM | POA: Diagnosis not present

## 2020-02-13 DIAGNOSIS — I69328 Other speech and language deficits following cerebral infarction: Secondary | ICD-10-CM | POA: Diagnosis not present

## 2020-02-18 DIAGNOSIS — I6389 Other cerebral infarction: Secondary | ICD-10-CM | POA: Diagnosis not present

## 2020-02-18 DIAGNOSIS — I69328 Other speech and language deficits following cerebral infarction: Secondary | ICD-10-CM | POA: Diagnosis not present

## 2020-02-20 DIAGNOSIS — I69328 Other speech and language deficits following cerebral infarction: Secondary | ICD-10-CM | POA: Diagnosis not present

## 2020-02-20 DIAGNOSIS — I6389 Other cerebral infarction: Secondary | ICD-10-CM | POA: Diagnosis not present

## 2020-02-25 DIAGNOSIS — I69328 Other speech and language deficits following cerebral infarction: Secondary | ICD-10-CM | POA: Diagnosis not present

## 2020-02-25 DIAGNOSIS — I6389 Other cerebral infarction: Secondary | ICD-10-CM | POA: Diagnosis not present

## 2020-02-27 DIAGNOSIS — I6389 Other cerebral infarction: Secondary | ICD-10-CM | POA: Diagnosis not present

## 2020-02-27 DIAGNOSIS — I69328 Other speech and language deficits following cerebral infarction: Secondary | ICD-10-CM | POA: Diagnosis not present

## 2020-03-03 DIAGNOSIS — I6389 Other cerebral infarction: Secondary | ICD-10-CM | POA: Diagnosis not present

## 2020-03-03 DIAGNOSIS — I69328 Other speech and language deficits following cerebral infarction: Secondary | ICD-10-CM | POA: Diagnosis not present

## 2020-03-04 DIAGNOSIS — Z23 Encounter for immunization: Secondary | ICD-10-CM | POA: Diagnosis not present

## 2020-03-05 DIAGNOSIS — I69328 Other speech and language deficits following cerebral infarction: Secondary | ICD-10-CM | POA: Diagnosis not present

## 2020-03-05 DIAGNOSIS — I6389 Other cerebral infarction: Secondary | ICD-10-CM | POA: Diagnosis not present

## 2020-03-17 DIAGNOSIS — I6389 Other cerebral infarction: Secondary | ICD-10-CM | POA: Diagnosis not present

## 2020-03-17 DIAGNOSIS — I69328 Other speech and language deficits following cerebral infarction: Secondary | ICD-10-CM | POA: Diagnosis not present

## 2020-03-19 DIAGNOSIS — I6389 Other cerebral infarction: Secondary | ICD-10-CM | POA: Diagnosis not present

## 2020-03-19 DIAGNOSIS — I69328 Other speech and language deficits following cerebral infarction: Secondary | ICD-10-CM | POA: Diagnosis not present

## 2020-03-24 DIAGNOSIS — I69328 Other speech and language deficits following cerebral infarction: Secondary | ICD-10-CM | POA: Diagnosis not present

## 2020-03-24 DIAGNOSIS — H401131 Primary open-angle glaucoma, bilateral, mild stage: Secondary | ICD-10-CM | POA: Diagnosis not present

## 2020-03-24 DIAGNOSIS — H2512 Age-related nuclear cataract, left eye: Secondary | ICD-10-CM | POA: Diagnosis not present

## 2020-03-24 DIAGNOSIS — H524 Presbyopia: Secondary | ICD-10-CM | POA: Diagnosis not present

## 2020-03-24 DIAGNOSIS — I6389 Other cerebral infarction: Secondary | ICD-10-CM | POA: Diagnosis not present

## 2020-03-26 ENCOUNTER — Ambulatory Visit: Payer: Medicare Other | Admitting: Physical Medicine & Rehabilitation

## 2020-03-26 DIAGNOSIS — I69328 Other speech and language deficits following cerebral infarction: Secondary | ICD-10-CM | POA: Diagnosis not present

## 2020-03-26 DIAGNOSIS — I6389 Other cerebral infarction: Secondary | ICD-10-CM | POA: Diagnosis not present

## 2020-03-31 DIAGNOSIS — I6389 Other cerebral infarction: Secondary | ICD-10-CM | POA: Diagnosis not present

## 2020-03-31 DIAGNOSIS — I69328 Other speech and language deficits following cerebral infarction: Secondary | ICD-10-CM | POA: Diagnosis not present

## 2020-04-04 DIAGNOSIS — I69328 Other speech and language deficits following cerebral infarction: Secondary | ICD-10-CM | POA: Diagnosis not present

## 2020-04-04 DIAGNOSIS — I6389 Other cerebral infarction: Secondary | ICD-10-CM | POA: Diagnosis not present

## 2020-04-14 DIAGNOSIS — I69328 Other speech and language deficits following cerebral infarction: Secondary | ICD-10-CM | POA: Diagnosis not present

## 2020-04-14 DIAGNOSIS — I6389 Other cerebral infarction: Secondary | ICD-10-CM | POA: Diagnosis not present

## 2020-04-28 DIAGNOSIS — I69328 Other speech and language deficits following cerebral infarction: Secondary | ICD-10-CM | POA: Diagnosis not present

## 2020-04-28 DIAGNOSIS — G8111 Spastic hemiplegia affecting right dominant side: Secondary | ICD-10-CM | POA: Diagnosis not present

## 2020-04-28 DIAGNOSIS — I6389 Other cerebral infarction: Secondary | ICD-10-CM | POA: Diagnosis not present

## 2020-05-02 DIAGNOSIS — G8111 Spastic hemiplegia affecting right dominant side: Secondary | ICD-10-CM | POA: Diagnosis not present

## 2020-05-02 DIAGNOSIS — I6389 Other cerebral infarction: Secondary | ICD-10-CM | POA: Diagnosis not present

## 2020-05-02 DIAGNOSIS — I69328 Other speech and language deficits following cerebral infarction: Secondary | ICD-10-CM | POA: Diagnosis not present

## 2020-05-06 DIAGNOSIS — I6389 Other cerebral infarction: Secondary | ICD-10-CM | POA: Diagnosis not present

## 2020-05-06 DIAGNOSIS — I69328 Other speech and language deficits following cerebral infarction: Secondary | ICD-10-CM | POA: Diagnosis not present

## 2020-05-06 DIAGNOSIS — G8111 Spastic hemiplegia affecting right dominant side: Secondary | ICD-10-CM | POA: Diagnosis not present

## 2020-05-07 ENCOUNTER — Other Ambulatory Visit: Payer: Self-pay

## 2020-05-07 ENCOUNTER — Encounter: Payer: Medicare Other | Attending: Physical Medicine & Rehabilitation | Admitting: Physical Medicine & Rehabilitation

## 2020-05-07 ENCOUNTER — Encounter: Payer: Self-pay | Admitting: Physical Medicine & Rehabilitation

## 2020-05-07 VITALS — BP 131/76 | HR 67 | Temp 98.3°F

## 2020-05-07 DIAGNOSIS — G8110 Spastic hemiplegia affecting unspecified side: Secondary | ICD-10-CM | POA: Diagnosis not present

## 2020-05-07 NOTE — Progress Notes (Signed)
Botox Injection for spasticity of upper extremity using needle EMG guidance Indication: Spastic hemiplegia, dominant side (HCC)   Dilution: 100 Units/ml        Total Units Injected: 500 Indication: Severe spasticity which interferes with ADL,mobility and/or  hygiene and is unresponsive to medication management and other conservative care Informed consent was obtained after describing risks and benefits of the procedure with the patient. This includes bleeding, bruising, infection, excessive weakness, or medication side effects. A REMS form is on file and signed.  Needle: 57mm injectable monopolar needle electrode   G81.10 Number of units per muscle Pectoralis Major 0 units Pectoralis Minor 0 units Biceps 150 units Brachioradialis 50 units FCR 25 units FCU 25 units FDS 100 units FDP 100 units FPL 0 units Palmaris Longus 0 units Pronator Teres 50 units Pronator Quadratus 0 units Lumbricals 0 units All injections were done after obtaining appropriate EMG activity and after negative drawback for blood. The patient tolerated the procedure well. Post procedure instructions were given. Return in about 3 months (around 08/05/2020) for botox 500 u.

## 2020-05-07 NOTE — Patient Instructions (Signed)
PLEASE FEEL FREE TO CALL OUR OFFICE WITH ANY PROBLEMS OR QUESTIONS ([phone])      

## 2020-05-09 DIAGNOSIS — G8111 Spastic hemiplegia affecting right dominant side: Secondary | ICD-10-CM | POA: Diagnosis not present

## 2020-05-09 DIAGNOSIS — I69328 Other speech and language deficits following cerebral infarction: Secondary | ICD-10-CM | POA: Diagnosis not present

## 2020-05-09 DIAGNOSIS — I6389 Other cerebral infarction: Secondary | ICD-10-CM | POA: Diagnosis not present

## 2020-05-12 DIAGNOSIS — I6389 Other cerebral infarction: Secondary | ICD-10-CM | POA: Diagnosis not present

## 2020-05-12 DIAGNOSIS — I69328 Other speech and language deficits following cerebral infarction: Secondary | ICD-10-CM | POA: Diagnosis not present

## 2020-05-12 DIAGNOSIS — G8111 Spastic hemiplegia affecting right dominant side: Secondary | ICD-10-CM | POA: Diagnosis not present

## 2020-05-14 DIAGNOSIS — I6389 Other cerebral infarction: Secondary | ICD-10-CM | POA: Diagnosis not present

## 2020-05-14 DIAGNOSIS — G8111 Spastic hemiplegia affecting right dominant side: Secondary | ICD-10-CM | POA: Diagnosis not present

## 2020-05-14 DIAGNOSIS — I69328 Other speech and language deficits following cerebral infarction: Secondary | ICD-10-CM | POA: Diagnosis not present

## 2020-05-19 DIAGNOSIS — G8111 Spastic hemiplegia affecting right dominant side: Secondary | ICD-10-CM | POA: Diagnosis not present

## 2020-05-19 DIAGNOSIS — I6389 Other cerebral infarction: Secondary | ICD-10-CM | POA: Diagnosis not present

## 2020-05-19 DIAGNOSIS — I69328 Other speech and language deficits following cerebral infarction: Secondary | ICD-10-CM | POA: Diagnosis not present

## 2020-05-22 DIAGNOSIS — I6389 Other cerebral infarction: Secondary | ICD-10-CM | POA: Diagnosis not present

## 2020-05-22 DIAGNOSIS — G8111 Spastic hemiplegia affecting right dominant side: Secondary | ICD-10-CM | POA: Diagnosis not present

## 2020-05-22 DIAGNOSIS — I69328 Other speech and language deficits following cerebral infarction: Secondary | ICD-10-CM | POA: Diagnosis not present

## 2020-05-23 DIAGNOSIS — R293 Abnormal posture: Secondary | ICD-10-CM | POA: Diagnosis not present

## 2020-05-23 DIAGNOSIS — G8111 Spastic hemiplegia affecting right dominant side: Secondary | ICD-10-CM | POA: Diagnosis not present

## 2020-05-23 DIAGNOSIS — M6389 Disorders of muscle in diseases classified elsewhere, multiple sites: Secondary | ICD-10-CM | POA: Diagnosis not present

## 2020-05-23 DIAGNOSIS — Z8673 Personal history of transient ischemic attack (TIA), and cerebral infarction without residual deficits: Secondary | ICD-10-CM | POA: Diagnosis not present

## 2020-05-26 DIAGNOSIS — Z8673 Personal history of transient ischemic attack (TIA), and cerebral infarction without residual deficits: Secondary | ICD-10-CM | POA: Diagnosis not present

## 2020-05-26 DIAGNOSIS — I69328 Other speech and language deficits following cerebral infarction: Secondary | ICD-10-CM | POA: Diagnosis not present

## 2020-05-26 DIAGNOSIS — I6389 Other cerebral infarction: Secondary | ICD-10-CM | POA: Diagnosis not present

## 2020-05-26 DIAGNOSIS — G8111 Spastic hemiplegia affecting right dominant side: Secondary | ICD-10-CM | POA: Diagnosis not present

## 2020-05-26 DIAGNOSIS — R293 Abnormal posture: Secondary | ICD-10-CM | POA: Diagnosis not present

## 2020-05-26 DIAGNOSIS — M6389 Disorders of muscle in diseases classified elsewhere, multiple sites: Secondary | ICD-10-CM | POA: Diagnosis not present

## 2020-05-28 DIAGNOSIS — G8111 Spastic hemiplegia affecting right dominant side: Secondary | ICD-10-CM | POA: Diagnosis not present

## 2020-05-28 DIAGNOSIS — Z8673 Personal history of transient ischemic attack (TIA), and cerebral infarction without residual deficits: Secondary | ICD-10-CM | POA: Diagnosis not present

## 2020-05-28 DIAGNOSIS — M6389 Disorders of muscle in diseases classified elsewhere, multiple sites: Secondary | ICD-10-CM | POA: Diagnosis not present

## 2020-05-28 DIAGNOSIS — R293 Abnormal posture: Secondary | ICD-10-CM | POA: Diagnosis not present

## 2020-05-29 DIAGNOSIS — I6389 Other cerebral infarction: Secondary | ICD-10-CM | POA: Diagnosis not present

## 2020-05-29 DIAGNOSIS — G8111 Spastic hemiplegia affecting right dominant side: Secondary | ICD-10-CM | POA: Diagnosis not present

## 2020-05-29 DIAGNOSIS — I69328 Other speech and language deficits following cerebral infarction: Secondary | ICD-10-CM | POA: Diagnosis not present

## 2020-06-03 DIAGNOSIS — I69328 Other speech and language deficits following cerebral infarction: Secondary | ICD-10-CM | POA: Diagnosis not present

## 2020-06-03 DIAGNOSIS — M6389 Disorders of muscle in diseases classified elsewhere, multiple sites: Secondary | ICD-10-CM | POA: Diagnosis not present

## 2020-06-03 DIAGNOSIS — G8111 Spastic hemiplegia affecting right dominant side: Secondary | ICD-10-CM | POA: Diagnosis not present

## 2020-06-03 DIAGNOSIS — I6389 Other cerebral infarction: Secondary | ICD-10-CM | POA: Diagnosis not present

## 2020-06-03 DIAGNOSIS — Z8673 Personal history of transient ischemic attack (TIA), and cerebral infarction without residual deficits: Secondary | ICD-10-CM | POA: Diagnosis not present

## 2020-06-03 DIAGNOSIS — R293 Abnormal posture: Secondary | ICD-10-CM | POA: Diagnosis not present

## 2020-06-05 DIAGNOSIS — I69328 Other speech and language deficits following cerebral infarction: Secondary | ICD-10-CM | POA: Diagnosis not present

## 2020-06-05 DIAGNOSIS — G8111 Spastic hemiplegia affecting right dominant side: Secondary | ICD-10-CM | POA: Diagnosis not present

## 2020-06-05 DIAGNOSIS — I6389 Other cerebral infarction: Secondary | ICD-10-CM | POA: Diagnosis not present

## 2020-06-06 DIAGNOSIS — Z8673 Personal history of transient ischemic attack (TIA), and cerebral infarction without residual deficits: Secondary | ICD-10-CM | POA: Diagnosis not present

## 2020-06-06 DIAGNOSIS — M6389 Disorders of muscle in diseases classified elsewhere, multiple sites: Secondary | ICD-10-CM | POA: Diagnosis not present

## 2020-06-06 DIAGNOSIS — G8111 Spastic hemiplegia affecting right dominant side: Secondary | ICD-10-CM | POA: Diagnosis not present

## 2020-06-06 DIAGNOSIS — R293 Abnormal posture: Secondary | ICD-10-CM | POA: Diagnosis not present

## 2020-06-09 DIAGNOSIS — R293 Abnormal posture: Secondary | ICD-10-CM | POA: Diagnosis not present

## 2020-06-09 DIAGNOSIS — G8111 Spastic hemiplegia affecting right dominant side: Secondary | ICD-10-CM | POA: Diagnosis not present

## 2020-06-09 DIAGNOSIS — M6389 Disorders of muscle in diseases classified elsewhere, multiple sites: Secondary | ICD-10-CM | POA: Diagnosis not present

## 2020-06-09 DIAGNOSIS — Z8673 Personal history of transient ischemic attack (TIA), and cerebral infarction without residual deficits: Secondary | ICD-10-CM | POA: Diagnosis not present

## 2020-06-10 DIAGNOSIS — I6389 Other cerebral infarction: Secondary | ICD-10-CM | POA: Diagnosis not present

## 2020-06-10 DIAGNOSIS — I69328 Other speech and language deficits following cerebral infarction: Secondary | ICD-10-CM | POA: Diagnosis not present

## 2020-06-10 DIAGNOSIS — G8111 Spastic hemiplegia affecting right dominant side: Secondary | ICD-10-CM | POA: Diagnosis not present

## 2020-06-12 DIAGNOSIS — G8111 Spastic hemiplegia affecting right dominant side: Secondary | ICD-10-CM | POA: Diagnosis not present

## 2020-06-12 DIAGNOSIS — M6389 Disorders of muscle in diseases classified elsewhere, multiple sites: Secondary | ICD-10-CM | POA: Diagnosis not present

## 2020-06-12 DIAGNOSIS — Z8673 Personal history of transient ischemic attack (TIA), and cerebral infarction without residual deficits: Secondary | ICD-10-CM | POA: Diagnosis not present

## 2020-06-12 DIAGNOSIS — R293 Abnormal posture: Secondary | ICD-10-CM | POA: Diagnosis not present

## 2020-06-13 DIAGNOSIS — I6389 Other cerebral infarction: Secondary | ICD-10-CM | POA: Diagnosis not present

## 2020-06-13 DIAGNOSIS — I69328 Other speech and language deficits following cerebral infarction: Secondary | ICD-10-CM | POA: Diagnosis not present

## 2020-06-13 DIAGNOSIS — G8111 Spastic hemiplegia affecting right dominant side: Secondary | ICD-10-CM | POA: Diagnosis not present

## 2020-06-16 DIAGNOSIS — Z8673 Personal history of transient ischemic attack (TIA), and cerebral infarction without residual deficits: Secondary | ICD-10-CM | POA: Diagnosis not present

## 2020-06-16 DIAGNOSIS — M6389 Disorders of muscle in diseases classified elsewhere, multiple sites: Secondary | ICD-10-CM | POA: Diagnosis not present

## 2020-06-16 DIAGNOSIS — R293 Abnormal posture: Secondary | ICD-10-CM | POA: Diagnosis not present

## 2020-06-16 DIAGNOSIS — G8111 Spastic hemiplegia affecting right dominant side: Secondary | ICD-10-CM | POA: Diagnosis not present

## 2020-06-17 DIAGNOSIS — I69328 Other speech and language deficits following cerebral infarction: Secondary | ICD-10-CM | POA: Diagnosis not present

## 2020-06-17 DIAGNOSIS — I6389 Other cerebral infarction: Secondary | ICD-10-CM | POA: Diagnosis not present

## 2020-06-17 DIAGNOSIS — G8111 Spastic hemiplegia affecting right dominant side: Secondary | ICD-10-CM | POA: Diagnosis not present

## 2020-06-19 DIAGNOSIS — M6389 Disorders of muscle in diseases classified elsewhere, multiple sites: Secondary | ICD-10-CM | POA: Diagnosis not present

## 2020-06-19 DIAGNOSIS — G8111 Spastic hemiplegia affecting right dominant side: Secondary | ICD-10-CM | POA: Diagnosis not present

## 2020-06-19 DIAGNOSIS — R293 Abnormal posture: Secondary | ICD-10-CM | POA: Diagnosis not present

## 2020-06-19 DIAGNOSIS — Z8673 Personal history of transient ischemic attack (TIA), and cerebral infarction without residual deficits: Secondary | ICD-10-CM | POA: Diagnosis not present

## 2020-06-20 DIAGNOSIS — J45909 Unspecified asthma, uncomplicated: Secondary | ICD-10-CM | POA: Diagnosis not present

## 2020-06-20 DIAGNOSIS — I340 Nonrheumatic mitral (valve) insufficiency: Secondary | ICD-10-CM | POA: Diagnosis not present

## 2020-06-20 DIAGNOSIS — I69320 Aphasia following cerebral infarction: Secondary | ICD-10-CM | POA: Diagnosis not present

## 2020-06-20 DIAGNOSIS — Z8673 Personal history of transient ischemic attack (TIA), and cerebral infarction without residual deficits: Secondary | ICD-10-CM | POA: Diagnosis not present

## 2020-06-20 DIAGNOSIS — M8580 Other specified disorders of bone density and structure, unspecified site: Secondary | ICD-10-CM | POA: Diagnosis not present

## 2020-06-20 DIAGNOSIS — I6389 Other cerebral infarction: Secondary | ICD-10-CM | POA: Diagnosis not present

## 2020-06-20 DIAGNOSIS — G8110 Spastic hemiplegia affecting unspecified side: Secondary | ICD-10-CM | POA: Diagnosis not present

## 2020-06-20 DIAGNOSIS — I350 Nonrheumatic aortic (valve) stenosis: Secondary | ICD-10-CM | POA: Diagnosis not present

## 2020-06-20 DIAGNOSIS — E785 Hyperlipidemia, unspecified: Secondary | ICD-10-CM | POA: Diagnosis not present

## 2020-06-20 DIAGNOSIS — I69328 Other speech and language deficits following cerebral infarction: Secondary | ICD-10-CM | POA: Diagnosis not present

## 2020-06-20 DIAGNOSIS — I69359 Hemiplegia and hemiparesis following cerebral infarction affecting unspecified side: Secondary | ICD-10-CM | POA: Diagnosis not present

## 2020-06-20 DIAGNOSIS — G8111 Spastic hemiplegia affecting right dominant side: Secondary | ICD-10-CM | POA: Diagnosis not present

## 2020-06-20 DIAGNOSIS — I10 Essential (primary) hypertension: Secondary | ICD-10-CM | POA: Diagnosis not present

## 2020-06-23 DIAGNOSIS — Z8673 Personal history of transient ischemic attack (TIA), and cerebral infarction without residual deficits: Secondary | ICD-10-CM | POA: Diagnosis not present

## 2020-06-23 DIAGNOSIS — R293 Abnormal posture: Secondary | ICD-10-CM | POA: Diagnosis not present

## 2020-06-23 DIAGNOSIS — M6389 Disorders of muscle in diseases classified elsewhere, multiple sites: Secondary | ICD-10-CM | POA: Diagnosis not present

## 2020-06-23 DIAGNOSIS — G8111 Spastic hemiplegia affecting right dominant side: Secondary | ICD-10-CM | POA: Diagnosis not present

## 2020-06-24 DIAGNOSIS — I6389 Other cerebral infarction: Secondary | ICD-10-CM | POA: Diagnosis not present

## 2020-06-24 DIAGNOSIS — G8111 Spastic hemiplegia affecting right dominant side: Secondary | ICD-10-CM | POA: Diagnosis not present

## 2020-06-24 DIAGNOSIS — I69328 Other speech and language deficits following cerebral infarction: Secondary | ICD-10-CM | POA: Diagnosis not present

## 2020-06-27 DIAGNOSIS — M6389 Disorders of muscle in diseases classified elsewhere, multiple sites: Secondary | ICD-10-CM | POA: Diagnosis not present

## 2020-06-27 DIAGNOSIS — G8111 Spastic hemiplegia affecting right dominant side: Secondary | ICD-10-CM | POA: Diagnosis not present

## 2020-06-27 DIAGNOSIS — I69328 Other speech and language deficits following cerebral infarction: Secondary | ICD-10-CM | POA: Diagnosis not present

## 2020-06-27 DIAGNOSIS — R293 Abnormal posture: Secondary | ICD-10-CM | POA: Diagnosis not present

## 2020-06-27 DIAGNOSIS — I6389 Other cerebral infarction: Secondary | ICD-10-CM | POA: Diagnosis not present

## 2020-06-27 DIAGNOSIS — Z8673 Personal history of transient ischemic attack (TIA), and cerebral infarction without residual deficits: Secondary | ICD-10-CM | POA: Diagnosis not present

## 2020-06-30 DIAGNOSIS — M6389 Disorders of muscle in diseases classified elsewhere, multiple sites: Secondary | ICD-10-CM | POA: Diagnosis not present

## 2020-06-30 DIAGNOSIS — R293 Abnormal posture: Secondary | ICD-10-CM | POA: Diagnosis not present

## 2020-06-30 DIAGNOSIS — I6389 Other cerebral infarction: Secondary | ICD-10-CM | POA: Diagnosis not present

## 2020-06-30 DIAGNOSIS — Z8673 Personal history of transient ischemic attack (TIA), and cerebral infarction without residual deficits: Secondary | ICD-10-CM | POA: Diagnosis not present

## 2020-06-30 DIAGNOSIS — I69328 Other speech and language deficits following cerebral infarction: Secondary | ICD-10-CM | POA: Diagnosis not present

## 2020-06-30 DIAGNOSIS — G8111 Spastic hemiplegia affecting right dominant side: Secondary | ICD-10-CM | POA: Diagnosis not present

## 2020-07-03 DIAGNOSIS — Z8673 Personal history of transient ischemic attack (TIA), and cerebral infarction without residual deficits: Secondary | ICD-10-CM | POA: Diagnosis not present

## 2020-07-03 DIAGNOSIS — R293 Abnormal posture: Secondary | ICD-10-CM | POA: Diagnosis not present

## 2020-07-03 DIAGNOSIS — G8111 Spastic hemiplegia affecting right dominant side: Secondary | ICD-10-CM | POA: Diagnosis not present

## 2020-07-03 DIAGNOSIS — M6389 Disorders of muscle in diseases classified elsewhere, multiple sites: Secondary | ICD-10-CM | POA: Diagnosis not present

## 2020-07-07 DIAGNOSIS — Z8673 Personal history of transient ischemic attack (TIA), and cerebral infarction without residual deficits: Secondary | ICD-10-CM | POA: Diagnosis not present

## 2020-07-07 DIAGNOSIS — I69328 Other speech and language deficits following cerebral infarction: Secondary | ICD-10-CM | POA: Diagnosis not present

## 2020-07-07 DIAGNOSIS — I6389 Other cerebral infarction: Secondary | ICD-10-CM | POA: Diagnosis not present

## 2020-07-07 DIAGNOSIS — G8111 Spastic hemiplegia affecting right dominant side: Secondary | ICD-10-CM | POA: Diagnosis not present

## 2020-07-07 DIAGNOSIS — M6389 Disorders of muscle in diseases classified elsewhere, multiple sites: Secondary | ICD-10-CM | POA: Diagnosis not present

## 2020-07-07 DIAGNOSIS — R293 Abnormal posture: Secondary | ICD-10-CM | POA: Diagnosis not present

## 2020-07-10 DIAGNOSIS — M6389 Disorders of muscle in diseases classified elsewhere, multiple sites: Secondary | ICD-10-CM | POA: Diagnosis not present

## 2020-07-10 DIAGNOSIS — Z8673 Personal history of transient ischemic attack (TIA), and cerebral infarction without residual deficits: Secondary | ICD-10-CM | POA: Diagnosis not present

## 2020-07-10 DIAGNOSIS — G8111 Spastic hemiplegia affecting right dominant side: Secondary | ICD-10-CM | POA: Diagnosis not present

## 2020-07-10 DIAGNOSIS — R293 Abnormal posture: Secondary | ICD-10-CM | POA: Diagnosis not present

## 2020-07-11 DIAGNOSIS — I6389 Other cerebral infarction: Secondary | ICD-10-CM | POA: Diagnosis not present

## 2020-07-11 DIAGNOSIS — G8111 Spastic hemiplegia affecting right dominant side: Secondary | ICD-10-CM | POA: Diagnosis not present

## 2020-07-11 DIAGNOSIS — I69328 Other speech and language deficits following cerebral infarction: Secondary | ICD-10-CM | POA: Diagnosis not present

## 2020-07-14 DIAGNOSIS — H2512 Age-related nuclear cataract, left eye: Secondary | ICD-10-CM | POA: Diagnosis not present

## 2020-07-15 DIAGNOSIS — I6389 Other cerebral infarction: Secondary | ICD-10-CM | POA: Diagnosis not present

## 2020-07-15 DIAGNOSIS — G8111 Spastic hemiplegia affecting right dominant side: Secondary | ICD-10-CM | POA: Diagnosis not present

## 2020-07-15 DIAGNOSIS — I69328 Other speech and language deficits following cerebral infarction: Secondary | ICD-10-CM | POA: Diagnosis not present

## 2020-07-17 DIAGNOSIS — Z8673 Personal history of transient ischemic attack (TIA), and cerebral infarction without residual deficits: Secondary | ICD-10-CM | POA: Diagnosis not present

## 2020-07-17 DIAGNOSIS — M6389 Disorders of muscle in diseases classified elsewhere, multiple sites: Secondary | ICD-10-CM | POA: Diagnosis not present

## 2020-07-17 DIAGNOSIS — G8111 Spastic hemiplegia affecting right dominant side: Secondary | ICD-10-CM | POA: Diagnosis not present

## 2020-07-17 DIAGNOSIS — R293 Abnormal posture: Secondary | ICD-10-CM | POA: Diagnosis not present

## 2020-07-18 DIAGNOSIS — I6389 Other cerebral infarction: Secondary | ICD-10-CM | POA: Diagnosis not present

## 2020-07-18 DIAGNOSIS — G8111 Spastic hemiplegia affecting right dominant side: Secondary | ICD-10-CM | POA: Diagnosis not present

## 2020-07-18 DIAGNOSIS — I69328 Other speech and language deficits following cerebral infarction: Secondary | ICD-10-CM | POA: Diagnosis not present

## 2020-07-21 DIAGNOSIS — Z8673 Personal history of transient ischemic attack (TIA), and cerebral infarction without residual deficits: Secondary | ICD-10-CM | POA: Diagnosis not present

## 2020-07-21 DIAGNOSIS — M6389 Disorders of muscle in diseases classified elsewhere, multiple sites: Secondary | ICD-10-CM | POA: Diagnosis not present

## 2020-07-21 DIAGNOSIS — G8111 Spastic hemiplegia affecting right dominant side: Secondary | ICD-10-CM | POA: Diagnosis not present

## 2020-07-21 DIAGNOSIS — R293 Abnormal posture: Secondary | ICD-10-CM | POA: Diagnosis not present

## 2020-07-22 DIAGNOSIS — I69328 Other speech and language deficits following cerebral infarction: Secondary | ICD-10-CM | POA: Diagnosis not present

## 2020-07-22 DIAGNOSIS — G8111 Spastic hemiplegia affecting right dominant side: Secondary | ICD-10-CM | POA: Diagnosis not present

## 2020-07-22 DIAGNOSIS — I6389 Other cerebral infarction: Secondary | ICD-10-CM | POA: Diagnosis not present

## 2020-07-24 DIAGNOSIS — I69328 Other speech and language deficits following cerebral infarction: Secondary | ICD-10-CM | POA: Diagnosis not present

## 2020-07-24 DIAGNOSIS — I6389 Other cerebral infarction: Secondary | ICD-10-CM | POA: Diagnosis not present

## 2020-07-24 DIAGNOSIS — G8111 Spastic hemiplegia affecting right dominant side: Secondary | ICD-10-CM | POA: Diagnosis not present

## 2020-07-25 DIAGNOSIS — M6389 Disorders of muscle in diseases classified elsewhere, multiple sites: Secondary | ICD-10-CM | POA: Diagnosis not present

## 2020-07-25 DIAGNOSIS — G8111 Spastic hemiplegia affecting right dominant side: Secondary | ICD-10-CM | POA: Diagnosis not present

## 2020-07-25 DIAGNOSIS — Z8673 Personal history of transient ischemic attack (TIA), and cerebral infarction without residual deficits: Secondary | ICD-10-CM | POA: Diagnosis not present

## 2020-07-25 DIAGNOSIS — R293 Abnormal posture: Secondary | ICD-10-CM | POA: Diagnosis not present

## 2020-07-28 DIAGNOSIS — Z23 Encounter for immunization: Secondary | ICD-10-CM | POA: Diagnosis not present

## 2020-07-28 DIAGNOSIS — Z8673 Personal history of transient ischemic attack (TIA), and cerebral infarction without residual deficits: Secondary | ICD-10-CM | POA: Diagnosis not present

## 2020-07-28 DIAGNOSIS — R293 Abnormal posture: Secondary | ICD-10-CM | POA: Diagnosis not present

## 2020-07-28 DIAGNOSIS — G8111 Spastic hemiplegia affecting right dominant side: Secondary | ICD-10-CM | POA: Diagnosis not present

## 2020-07-28 DIAGNOSIS — M6389 Disorders of muscle in diseases classified elsewhere, multiple sites: Secondary | ICD-10-CM | POA: Diagnosis not present

## 2020-07-30 DIAGNOSIS — R293 Abnormal posture: Secondary | ICD-10-CM | POA: Diagnosis not present

## 2020-07-30 DIAGNOSIS — G8111 Spastic hemiplegia affecting right dominant side: Secondary | ICD-10-CM | POA: Diagnosis not present

## 2020-07-30 DIAGNOSIS — Z8673 Personal history of transient ischemic attack (TIA), and cerebral infarction without residual deficits: Secondary | ICD-10-CM | POA: Diagnosis not present

## 2020-07-30 DIAGNOSIS — M6389 Disorders of muscle in diseases classified elsewhere, multiple sites: Secondary | ICD-10-CM | POA: Diagnosis not present

## 2020-08-04 DIAGNOSIS — R293 Abnormal posture: Secondary | ICD-10-CM | POA: Diagnosis not present

## 2020-08-04 DIAGNOSIS — G8111 Spastic hemiplegia affecting right dominant side: Secondary | ICD-10-CM | POA: Diagnosis not present

## 2020-08-04 DIAGNOSIS — M6389 Disorders of muscle in diseases classified elsewhere, multiple sites: Secondary | ICD-10-CM | POA: Diagnosis not present

## 2020-08-04 DIAGNOSIS — Z8673 Personal history of transient ischemic attack (TIA), and cerebral infarction without residual deficits: Secondary | ICD-10-CM | POA: Diagnosis not present

## 2020-08-06 ENCOUNTER — Encounter: Payer: Self-pay | Admitting: Physical Medicine & Rehabilitation

## 2020-08-06 ENCOUNTER — Encounter: Payer: Medicare Other | Attending: Physical Medicine & Rehabilitation | Admitting: Physical Medicine & Rehabilitation

## 2020-08-06 ENCOUNTER — Other Ambulatory Visit: Payer: Self-pay

## 2020-08-06 VITALS — BP 143/78 | HR 66 | Temp 98.8°F | Ht 65.0 in | Wt 119.8 lb

## 2020-08-06 DIAGNOSIS — G8110 Spastic hemiplegia affecting unspecified side: Secondary | ICD-10-CM

## 2020-08-06 DIAGNOSIS — G8111 Spastic hemiplegia affecting right dominant side: Secondary | ICD-10-CM | POA: Diagnosis not present

## 2020-08-06 NOTE — Progress Notes (Signed)
Botox Injection for spasticity of upper extremity using needle EMG guidance Indication: Spastic hemiplegia, dominant side (HCC)  G81.10 Dilution: 100 Units/ml        Total Units Injected: 500 Indication: Severe spasticity which interferes with ADL,mobility and/or  hygiene and is unresponsive to medication management and other conservative care Informed consent was obtained after describing risks and benefits of the procedure with the patient. This includes bleeding, bruising, infection, excessive weakness, or medication side effects. A REMS form is on file and signed.  Needle: 8mm injectable monopolar needle electrode    Number of units per muscle Pectoralis Major 100 units Pectoralis Minor 50 units Biceps 100 units Brachioradialis 50 units Triceps 50 units FCR 10 units FCU 10 units FDS 50 units FDP 50 units FPL 0 units Palmaris Longus 0 units Pronator Teres 30 units Pronator Quadratus 0 units Lumbricals 0 units All injections were done after obtaining appropriate EMG activity and after negative drawback for blood. The patient tolerated the procedure well. Post procedure instructions were given. Return in about 3 months (around 11/05/2020) for botox 500 units  pecs, biceps, finger and wrist flexors.

## 2020-08-06 NOTE — Patient Instructions (Signed)
PLEASE FEEL FREE TO CALL OUR OFFICE WITH ANY PROBLEMS OR QUESTIONS ([phone])      

## 2020-08-07 DIAGNOSIS — Z8673 Personal history of transient ischemic attack (TIA), and cerebral infarction without residual deficits: Secondary | ICD-10-CM | POA: Diagnosis not present

## 2020-08-07 DIAGNOSIS — M6389 Disorders of muscle in diseases classified elsewhere, multiple sites: Secondary | ICD-10-CM | POA: Diagnosis not present

## 2020-08-07 DIAGNOSIS — R293 Abnormal posture: Secondary | ICD-10-CM | POA: Diagnosis not present

## 2020-08-07 DIAGNOSIS — G8111 Spastic hemiplegia affecting right dominant side: Secondary | ICD-10-CM | POA: Diagnosis not present

## 2020-08-11 DIAGNOSIS — G8111 Spastic hemiplegia affecting right dominant side: Secondary | ICD-10-CM | POA: Diagnosis not present

## 2020-08-11 DIAGNOSIS — R293 Abnormal posture: Secondary | ICD-10-CM | POA: Diagnosis not present

## 2020-08-11 DIAGNOSIS — M6389 Disorders of muscle in diseases classified elsewhere, multiple sites: Secondary | ICD-10-CM | POA: Diagnosis not present

## 2020-08-11 DIAGNOSIS — Z8673 Personal history of transient ischemic attack (TIA), and cerebral infarction without residual deficits: Secondary | ICD-10-CM | POA: Diagnosis not present

## 2020-08-12 DIAGNOSIS — I6389 Other cerebral infarction: Secondary | ICD-10-CM | POA: Diagnosis not present

## 2020-08-12 DIAGNOSIS — G8111 Spastic hemiplegia affecting right dominant side: Secondary | ICD-10-CM | POA: Diagnosis not present

## 2020-08-12 DIAGNOSIS — I69328 Other speech and language deficits following cerebral infarction: Secondary | ICD-10-CM | POA: Diagnosis not present

## 2020-08-14 DIAGNOSIS — R293 Abnormal posture: Secondary | ICD-10-CM | POA: Diagnosis not present

## 2020-08-14 DIAGNOSIS — I6389 Other cerebral infarction: Secondary | ICD-10-CM | POA: Diagnosis not present

## 2020-08-14 DIAGNOSIS — M6389 Disorders of muscle in diseases classified elsewhere, multiple sites: Secondary | ICD-10-CM | POA: Diagnosis not present

## 2020-08-14 DIAGNOSIS — Z8673 Personal history of transient ischemic attack (TIA), and cerebral infarction without residual deficits: Secondary | ICD-10-CM | POA: Diagnosis not present

## 2020-08-14 DIAGNOSIS — I69328 Other speech and language deficits following cerebral infarction: Secondary | ICD-10-CM | POA: Diagnosis not present

## 2020-08-14 DIAGNOSIS — G8111 Spastic hemiplegia affecting right dominant side: Secondary | ICD-10-CM | POA: Diagnosis not present

## 2020-08-18 DIAGNOSIS — R293 Abnormal posture: Secondary | ICD-10-CM | POA: Diagnosis not present

## 2020-08-18 DIAGNOSIS — I69328 Other speech and language deficits following cerebral infarction: Secondary | ICD-10-CM | POA: Diagnosis not present

## 2020-08-18 DIAGNOSIS — G8111 Spastic hemiplegia affecting right dominant side: Secondary | ICD-10-CM | POA: Diagnosis not present

## 2020-08-18 DIAGNOSIS — Z8673 Personal history of transient ischemic attack (TIA), and cerebral infarction without residual deficits: Secondary | ICD-10-CM | POA: Diagnosis not present

## 2020-08-18 DIAGNOSIS — M6389 Disorders of muscle in diseases classified elsewhere, multiple sites: Secondary | ICD-10-CM | POA: Diagnosis not present

## 2020-08-18 DIAGNOSIS — I6389 Other cerebral infarction: Secondary | ICD-10-CM | POA: Diagnosis not present

## 2020-08-19 DIAGNOSIS — I69328 Other speech and language deficits following cerebral infarction: Secondary | ICD-10-CM | POA: Diagnosis not present

## 2020-08-19 DIAGNOSIS — G8111 Spastic hemiplegia affecting right dominant side: Secondary | ICD-10-CM | POA: Diagnosis not present

## 2020-08-19 DIAGNOSIS — I6389 Other cerebral infarction: Secondary | ICD-10-CM | POA: Diagnosis not present

## 2020-08-20 DIAGNOSIS — M6389 Disorders of muscle in diseases classified elsewhere, multiple sites: Secondary | ICD-10-CM | POA: Diagnosis not present

## 2020-08-20 DIAGNOSIS — R293 Abnormal posture: Secondary | ICD-10-CM | POA: Diagnosis not present

## 2020-08-20 DIAGNOSIS — Z8673 Personal history of transient ischemic attack (TIA), and cerebral infarction without residual deficits: Secondary | ICD-10-CM | POA: Diagnosis not present

## 2020-08-20 DIAGNOSIS — G8111 Spastic hemiplegia affecting right dominant side: Secondary | ICD-10-CM | POA: Diagnosis not present

## 2020-08-25 DIAGNOSIS — M6389 Disorders of muscle in diseases classified elsewhere, multiple sites: Secondary | ICD-10-CM | POA: Diagnosis not present

## 2020-08-25 DIAGNOSIS — Z8673 Personal history of transient ischemic attack (TIA), and cerebral infarction without residual deficits: Secondary | ICD-10-CM | POA: Diagnosis not present

## 2020-08-25 DIAGNOSIS — G8111 Spastic hemiplegia affecting right dominant side: Secondary | ICD-10-CM | POA: Diagnosis not present

## 2020-08-25 DIAGNOSIS — R293 Abnormal posture: Secondary | ICD-10-CM | POA: Diagnosis not present

## 2020-08-27 DIAGNOSIS — Z8673 Personal history of transient ischemic attack (TIA), and cerebral infarction without residual deficits: Secondary | ICD-10-CM | POA: Diagnosis not present

## 2020-08-27 DIAGNOSIS — G8111 Spastic hemiplegia affecting right dominant side: Secondary | ICD-10-CM | POA: Diagnosis not present

## 2020-08-27 DIAGNOSIS — M6389 Disorders of muscle in diseases classified elsewhere, multiple sites: Secondary | ICD-10-CM | POA: Diagnosis not present

## 2020-08-27 DIAGNOSIS — R293 Abnormal posture: Secondary | ICD-10-CM | POA: Diagnosis not present

## 2020-08-28 DIAGNOSIS — I6389 Other cerebral infarction: Secondary | ICD-10-CM | POA: Diagnosis not present

## 2020-08-28 DIAGNOSIS — I69328 Other speech and language deficits following cerebral infarction: Secondary | ICD-10-CM | POA: Diagnosis not present

## 2020-08-28 DIAGNOSIS — G8111 Spastic hemiplegia affecting right dominant side: Secondary | ICD-10-CM | POA: Diagnosis not present

## 2020-09-01 DIAGNOSIS — G8111 Spastic hemiplegia affecting right dominant side: Secondary | ICD-10-CM | POA: Diagnosis not present

## 2020-09-01 DIAGNOSIS — I69328 Other speech and language deficits following cerebral infarction: Secondary | ICD-10-CM | POA: Diagnosis not present

## 2020-09-01 DIAGNOSIS — I6389 Other cerebral infarction: Secondary | ICD-10-CM | POA: Diagnosis not present

## 2020-09-02 DIAGNOSIS — R293 Abnormal posture: Secondary | ICD-10-CM | POA: Diagnosis not present

## 2020-09-02 DIAGNOSIS — G8111 Spastic hemiplegia affecting right dominant side: Secondary | ICD-10-CM | POA: Diagnosis not present

## 2020-09-02 DIAGNOSIS — Z8673 Personal history of transient ischemic attack (TIA), and cerebral infarction without residual deficits: Secondary | ICD-10-CM | POA: Diagnosis not present

## 2020-09-02 DIAGNOSIS — M6389 Disorders of muscle in diseases classified elsewhere, multiple sites: Secondary | ICD-10-CM | POA: Diagnosis not present

## 2020-09-03 DIAGNOSIS — G8111 Spastic hemiplegia affecting right dominant side: Secondary | ICD-10-CM | POA: Diagnosis not present

## 2020-09-03 DIAGNOSIS — I6389 Other cerebral infarction: Secondary | ICD-10-CM | POA: Diagnosis not present

## 2020-09-03 DIAGNOSIS — I69328 Other speech and language deficits following cerebral infarction: Secondary | ICD-10-CM | POA: Diagnosis not present

## 2020-09-04 DIAGNOSIS — M6389 Disorders of muscle in diseases classified elsewhere, multiple sites: Secondary | ICD-10-CM | POA: Diagnosis not present

## 2020-09-04 DIAGNOSIS — R293 Abnormal posture: Secondary | ICD-10-CM | POA: Diagnosis not present

## 2020-09-04 DIAGNOSIS — G8111 Spastic hemiplegia affecting right dominant side: Secondary | ICD-10-CM | POA: Diagnosis not present

## 2020-09-04 DIAGNOSIS — Z8673 Personal history of transient ischemic attack (TIA), and cerebral infarction without residual deficits: Secondary | ICD-10-CM | POA: Diagnosis not present

## 2020-09-08 DIAGNOSIS — I6389 Other cerebral infarction: Secondary | ICD-10-CM | POA: Diagnosis not present

## 2020-09-08 DIAGNOSIS — G8111 Spastic hemiplegia affecting right dominant side: Secondary | ICD-10-CM | POA: Diagnosis not present

## 2020-09-08 DIAGNOSIS — I69328 Other speech and language deficits following cerebral infarction: Secondary | ICD-10-CM | POA: Diagnosis not present

## 2020-09-09 DIAGNOSIS — G8111 Spastic hemiplegia affecting right dominant side: Secondary | ICD-10-CM | POA: Diagnosis not present

## 2020-09-09 DIAGNOSIS — Z8673 Personal history of transient ischemic attack (TIA), and cerebral infarction without residual deficits: Secondary | ICD-10-CM | POA: Diagnosis not present

## 2020-09-09 DIAGNOSIS — M6389 Disorders of muscle in diseases classified elsewhere, multiple sites: Secondary | ICD-10-CM | POA: Diagnosis not present

## 2020-09-09 DIAGNOSIS — R293 Abnormal posture: Secondary | ICD-10-CM | POA: Diagnosis not present

## 2020-09-10 DIAGNOSIS — I69328 Other speech and language deficits following cerebral infarction: Secondary | ICD-10-CM | POA: Diagnosis not present

## 2020-09-10 DIAGNOSIS — G8111 Spastic hemiplegia affecting right dominant side: Secondary | ICD-10-CM | POA: Diagnosis not present

## 2020-09-10 DIAGNOSIS — I6389 Other cerebral infarction: Secondary | ICD-10-CM | POA: Diagnosis not present

## 2020-09-11 DIAGNOSIS — Z8673 Personal history of transient ischemic attack (TIA), and cerebral infarction without residual deficits: Secondary | ICD-10-CM | POA: Diagnosis not present

## 2020-09-11 DIAGNOSIS — G8111 Spastic hemiplegia affecting right dominant side: Secondary | ICD-10-CM | POA: Diagnosis not present

## 2020-09-11 DIAGNOSIS — M6389 Disorders of muscle in diseases classified elsewhere, multiple sites: Secondary | ICD-10-CM | POA: Diagnosis not present

## 2020-09-11 DIAGNOSIS — R293 Abnormal posture: Secondary | ICD-10-CM | POA: Diagnosis not present

## 2020-09-15 DIAGNOSIS — G8111 Spastic hemiplegia affecting right dominant side: Secondary | ICD-10-CM | POA: Diagnosis not present

## 2020-09-15 DIAGNOSIS — I6389 Other cerebral infarction: Secondary | ICD-10-CM | POA: Diagnosis not present

## 2020-09-15 DIAGNOSIS — I69328 Other speech and language deficits following cerebral infarction: Secondary | ICD-10-CM | POA: Diagnosis not present

## 2020-09-22 DIAGNOSIS — G8111 Spastic hemiplegia affecting right dominant side: Secondary | ICD-10-CM | POA: Diagnosis not present

## 2020-09-22 DIAGNOSIS — Z8673 Personal history of transient ischemic attack (TIA), and cerebral infarction without residual deficits: Secondary | ICD-10-CM | POA: Diagnosis not present

## 2020-09-22 DIAGNOSIS — M6389 Disorders of muscle in diseases classified elsewhere, multiple sites: Secondary | ICD-10-CM | POA: Diagnosis not present

## 2020-09-22 DIAGNOSIS — R293 Abnormal posture: Secondary | ICD-10-CM | POA: Diagnosis not present

## 2020-09-25 DIAGNOSIS — M6389 Disorders of muscle in diseases classified elsewhere, multiple sites: Secondary | ICD-10-CM | POA: Diagnosis not present

## 2020-09-25 DIAGNOSIS — Z8673 Personal history of transient ischemic attack (TIA), and cerebral infarction without residual deficits: Secondary | ICD-10-CM | POA: Diagnosis not present

## 2020-09-25 DIAGNOSIS — R293 Abnormal posture: Secondary | ICD-10-CM | POA: Diagnosis not present

## 2020-09-25 DIAGNOSIS — G8111 Spastic hemiplegia affecting right dominant side: Secondary | ICD-10-CM | POA: Diagnosis not present

## 2020-09-29 DIAGNOSIS — I6389 Other cerebral infarction: Secondary | ICD-10-CM | POA: Diagnosis not present

## 2020-09-29 DIAGNOSIS — M6389 Disorders of muscle in diseases classified elsewhere, multiple sites: Secondary | ICD-10-CM | POA: Diagnosis not present

## 2020-09-29 DIAGNOSIS — Z8673 Personal history of transient ischemic attack (TIA), and cerebral infarction without residual deficits: Secondary | ICD-10-CM | POA: Diagnosis not present

## 2020-09-29 DIAGNOSIS — I69328 Other speech and language deficits following cerebral infarction: Secondary | ICD-10-CM | POA: Diagnosis not present

## 2020-09-29 DIAGNOSIS — G8111 Spastic hemiplegia affecting right dominant side: Secondary | ICD-10-CM | POA: Diagnosis not present

## 2020-09-29 DIAGNOSIS — R293 Abnormal posture: Secondary | ICD-10-CM | POA: Diagnosis not present

## 2020-09-30 DIAGNOSIS — G8111 Spastic hemiplegia affecting right dominant side: Secondary | ICD-10-CM | POA: Diagnosis not present

## 2020-09-30 DIAGNOSIS — I69328 Other speech and language deficits following cerebral infarction: Secondary | ICD-10-CM | POA: Diagnosis not present

## 2020-09-30 DIAGNOSIS — I6389 Other cerebral infarction: Secondary | ICD-10-CM | POA: Diagnosis not present

## 2020-10-01 ENCOUNTER — Ambulatory Visit: Payer: Medicare Other | Admitting: Neurology

## 2020-10-13 DIAGNOSIS — I69328 Other speech and language deficits following cerebral infarction: Secondary | ICD-10-CM | POA: Diagnosis not present

## 2020-10-13 DIAGNOSIS — I6389 Other cerebral infarction: Secondary | ICD-10-CM | POA: Diagnosis not present

## 2020-10-13 DIAGNOSIS — G8111 Spastic hemiplegia affecting right dominant side: Secondary | ICD-10-CM | POA: Diagnosis not present

## 2020-10-16 DIAGNOSIS — I6389 Other cerebral infarction: Secondary | ICD-10-CM | POA: Diagnosis not present

## 2020-10-16 DIAGNOSIS — I69328 Other speech and language deficits following cerebral infarction: Secondary | ICD-10-CM | POA: Diagnosis not present

## 2020-10-16 DIAGNOSIS — G8111 Spastic hemiplegia affecting right dominant side: Secondary | ICD-10-CM | POA: Diagnosis not present

## 2020-10-21 DIAGNOSIS — Z8673 Personal history of transient ischemic attack (TIA), and cerebral infarction without residual deficits: Secondary | ICD-10-CM | POA: Diagnosis not present

## 2020-10-21 DIAGNOSIS — M6389 Disorders of muscle in diseases classified elsewhere, multiple sites: Secondary | ICD-10-CM | POA: Diagnosis not present

## 2020-10-21 DIAGNOSIS — R293 Abnormal posture: Secondary | ICD-10-CM | POA: Diagnosis not present

## 2020-10-21 DIAGNOSIS — G8111 Spastic hemiplegia affecting right dominant side: Secondary | ICD-10-CM | POA: Diagnosis not present

## 2020-10-22 DIAGNOSIS — I6389 Other cerebral infarction: Secondary | ICD-10-CM | POA: Diagnosis not present

## 2020-10-22 DIAGNOSIS — I69328 Other speech and language deficits following cerebral infarction: Secondary | ICD-10-CM | POA: Diagnosis not present

## 2020-10-22 DIAGNOSIS — G8111 Spastic hemiplegia affecting right dominant side: Secondary | ICD-10-CM | POA: Diagnosis not present

## 2020-10-24 DIAGNOSIS — I6389 Other cerebral infarction: Secondary | ICD-10-CM | POA: Diagnosis not present

## 2020-10-24 DIAGNOSIS — I69328 Other speech and language deficits following cerebral infarction: Secondary | ICD-10-CM | POA: Diagnosis not present

## 2020-10-24 DIAGNOSIS — G8111 Spastic hemiplegia affecting right dominant side: Secondary | ICD-10-CM | POA: Diagnosis not present

## 2020-10-27 DIAGNOSIS — Z8673 Personal history of transient ischemic attack (TIA), and cerebral infarction without residual deficits: Secondary | ICD-10-CM | POA: Diagnosis not present

## 2020-10-27 DIAGNOSIS — G8111 Spastic hemiplegia affecting right dominant side: Secondary | ICD-10-CM | POA: Diagnosis not present

## 2020-10-27 DIAGNOSIS — M6389 Disorders of muscle in diseases classified elsewhere, multiple sites: Secondary | ICD-10-CM | POA: Diagnosis not present

## 2020-10-27 DIAGNOSIS — R293 Abnormal posture: Secondary | ICD-10-CM | POA: Diagnosis not present

## 2020-10-28 DIAGNOSIS — I6389 Other cerebral infarction: Secondary | ICD-10-CM | POA: Diagnosis not present

## 2020-10-28 DIAGNOSIS — G8111 Spastic hemiplegia affecting right dominant side: Secondary | ICD-10-CM | POA: Diagnosis not present

## 2020-10-28 DIAGNOSIS — I69328 Other speech and language deficits following cerebral infarction: Secondary | ICD-10-CM | POA: Diagnosis not present

## 2020-10-30 DIAGNOSIS — R293 Abnormal posture: Secondary | ICD-10-CM | POA: Diagnosis not present

## 2020-10-30 DIAGNOSIS — M6389 Disorders of muscle in diseases classified elsewhere, multiple sites: Secondary | ICD-10-CM | POA: Diagnosis not present

## 2020-10-30 DIAGNOSIS — G8111 Spastic hemiplegia affecting right dominant side: Secondary | ICD-10-CM | POA: Diagnosis not present

## 2020-10-30 DIAGNOSIS — Z8673 Personal history of transient ischemic attack (TIA), and cerebral infarction without residual deficits: Secondary | ICD-10-CM | POA: Diagnosis not present

## 2020-10-31 DIAGNOSIS — G8111 Spastic hemiplegia affecting right dominant side: Secondary | ICD-10-CM | POA: Diagnosis not present

## 2020-10-31 DIAGNOSIS — I6389 Other cerebral infarction: Secondary | ICD-10-CM | POA: Diagnosis not present

## 2020-10-31 DIAGNOSIS — I69328 Other speech and language deficits following cerebral infarction: Secondary | ICD-10-CM | POA: Diagnosis not present

## 2020-11-03 ENCOUNTER — Telehealth: Payer: Self-pay | Admitting: Emergency Medicine

## 2020-11-03 DIAGNOSIS — I69328 Other speech and language deficits following cerebral infarction: Secondary | ICD-10-CM | POA: Diagnosis not present

## 2020-11-03 DIAGNOSIS — I6389 Other cerebral infarction: Secondary | ICD-10-CM | POA: Diagnosis not present

## 2020-11-03 DIAGNOSIS — G8111 Spastic hemiplegia affecting right dominant side: Secondary | ICD-10-CM | POA: Diagnosis not present

## 2020-11-03 NOTE — Telephone Encounter (Signed)
LVM on home and mobile phone, need to change appointment to 8/18 @ 0815 with Janett Billow, NP.   If patient calls back, please ask patient if she can change to that appointment day and time, if not, please schedule her another day/time with Janett Billow.    Thank you Doroteo Bradford.

## 2020-11-03 NOTE — Telephone Encounter (Signed)
Pt 's husband called back, the next available for Robin Billow, NP was offered to pt's husband he declined 7-19 due to another appointment for pt.  The next avail even with the wait list was declined.  Husband states he will take pt back to her PCP and then ended the call.

## 2020-11-04 DIAGNOSIS — M6389 Disorders of muscle in diseases classified elsewhere, multiple sites: Secondary | ICD-10-CM | POA: Diagnosis not present

## 2020-11-04 DIAGNOSIS — G8111 Spastic hemiplegia affecting right dominant side: Secondary | ICD-10-CM | POA: Diagnosis not present

## 2020-11-04 DIAGNOSIS — R293 Abnormal posture: Secondary | ICD-10-CM | POA: Diagnosis not present

## 2020-11-04 DIAGNOSIS — Z8673 Personal history of transient ischemic attack (TIA), and cerebral infarction without residual deficits: Secondary | ICD-10-CM | POA: Diagnosis not present

## 2020-11-05 ENCOUNTER — Encounter: Payer: Self-pay | Admitting: Physical Medicine & Rehabilitation

## 2020-11-05 ENCOUNTER — Other Ambulatory Visit: Payer: Self-pay

## 2020-11-05 ENCOUNTER — Encounter: Payer: Medicare Other | Attending: Physical Medicine & Rehabilitation | Admitting: Physical Medicine & Rehabilitation

## 2020-11-05 ENCOUNTER — Ambulatory Visit: Payer: Medicare Other | Admitting: Physical Medicine & Rehabilitation

## 2020-11-05 VITALS — BP 129/73 | HR 66 | Temp 98.3°F | Ht 65.0 in | Wt 120.0 lb

## 2020-11-05 DIAGNOSIS — G8110 Spastic hemiplegia affecting unspecified side: Secondary | ICD-10-CM | POA: Diagnosis not present

## 2020-11-05 DIAGNOSIS — G8111 Spastic hemiplegia affecting right dominant side: Secondary | ICD-10-CM | POA: Insufficient documentation

## 2020-11-05 NOTE — Progress Notes (Signed)
Botox Injection for spasticity of upper extremity using needle EMG guidance Indication: Spastic hemiplegia, dominant side (HCC)  G81.10 Dilution: 100 Units/ml        Total Units Injected: 500 Indication: Severe spasticity which interferes with ADL,mobility and/or  hygiene and is unresponsive to medication management and other conservative care Informed consent was obtained after describing risks and benefits of the procedure with the patient. This includes bleeding, bruising, infection, excessive weakness, or medication side effects. A REMS form is on file and signed.  Needle: 37mm injectable monopolar needle electrode    Number of units per muscle Pectoralis Major 25units Teres Major/minor 50 Latissimus Dorsi 50 Pectoralis Minor 25 units Biceps 75 units Brachioradialis 75 units FCR 20 units FCU 20 units FDS 30 units FDP 30 units FPL 0 units Palmaris Longus 0 units Pronator Teres 100 units Pronator Quadratus 0 units Lumbricals 0 units All injections were done after obtaining appropriate EMG activity and after negative drawback for blood. The patient tolerated the procedure well. Post procedure instructions were given. Return in about 3 months (around 02/05/2021) for 500units right pec, bicep, wrist and finger flexors.

## 2020-11-05 NOTE — Patient Instructions (Signed)
PLEASE FEEL FREE TO CALL OUR OFFICE WITH ANY PROBLEMS OR QUESTIONS ([phone])      

## 2020-11-06 DIAGNOSIS — G8111 Spastic hemiplegia affecting right dominant side: Secondary | ICD-10-CM | POA: Diagnosis not present

## 2020-11-06 DIAGNOSIS — R293 Abnormal posture: Secondary | ICD-10-CM | POA: Diagnosis not present

## 2020-11-06 DIAGNOSIS — Z8673 Personal history of transient ischemic attack (TIA), and cerebral infarction without residual deficits: Secondary | ICD-10-CM | POA: Diagnosis not present

## 2020-11-06 DIAGNOSIS — M6389 Disorders of muscle in diseases classified elsewhere, multiple sites: Secondary | ICD-10-CM | POA: Diagnosis not present

## 2020-11-07 DIAGNOSIS — G8111 Spastic hemiplegia affecting right dominant side: Secondary | ICD-10-CM | POA: Diagnosis not present

## 2020-11-07 DIAGNOSIS — I6389 Other cerebral infarction: Secondary | ICD-10-CM | POA: Diagnosis not present

## 2020-11-07 DIAGNOSIS — I69328 Other speech and language deficits following cerebral infarction: Secondary | ICD-10-CM | POA: Diagnosis not present

## 2020-11-10 DIAGNOSIS — I6389 Other cerebral infarction: Secondary | ICD-10-CM | POA: Diagnosis not present

## 2020-11-10 DIAGNOSIS — G8111 Spastic hemiplegia affecting right dominant side: Secondary | ICD-10-CM | POA: Diagnosis not present

## 2020-11-10 DIAGNOSIS — I69328 Other speech and language deficits following cerebral infarction: Secondary | ICD-10-CM | POA: Diagnosis not present

## 2020-11-11 DIAGNOSIS — R293 Abnormal posture: Secondary | ICD-10-CM | POA: Diagnosis not present

## 2020-11-11 DIAGNOSIS — M6389 Disorders of muscle in diseases classified elsewhere, multiple sites: Secondary | ICD-10-CM | POA: Diagnosis not present

## 2020-11-11 DIAGNOSIS — Z8673 Personal history of transient ischemic attack (TIA), and cerebral infarction without residual deficits: Secondary | ICD-10-CM | POA: Diagnosis not present

## 2020-11-11 DIAGNOSIS — G8111 Spastic hemiplegia affecting right dominant side: Secondary | ICD-10-CM | POA: Diagnosis not present

## 2020-11-13 DIAGNOSIS — M6389 Disorders of muscle in diseases classified elsewhere, multiple sites: Secondary | ICD-10-CM | POA: Diagnosis not present

## 2020-11-13 DIAGNOSIS — G8111 Spastic hemiplegia affecting right dominant side: Secondary | ICD-10-CM | POA: Diagnosis not present

## 2020-11-13 DIAGNOSIS — Z8673 Personal history of transient ischemic attack (TIA), and cerebral infarction without residual deficits: Secondary | ICD-10-CM | POA: Diagnosis not present

## 2020-11-13 DIAGNOSIS — R293 Abnormal posture: Secondary | ICD-10-CM | POA: Diagnosis not present

## 2020-11-14 DIAGNOSIS — I6389 Other cerebral infarction: Secondary | ICD-10-CM | POA: Diagnosis not present

## 2020-11-14 DIAGNOSIS — G8111 Spastic hemiplegia affecting right dominant side: Secondary | ICD-10-CM | POA: Diagnosis not present

## 2020-11-14 DIAGNOSIS — I69328 Other speech and language deficits following cerebral infarction: Secondary | ICD-10-CM | POA: Diagnosis not present

## 2020-11-17 DIAGNOSIS — I69328 Other speech and language deficits following cerebral infarction: Secondary | ICD-10-CM | POA: Diagnosis not present

## 2020-11-17 DIAGNOSIS — I6389 Other cerebral infarction: Secondary | ICD-10-CM | POA: Diagnosis not present

## 2020-11-17 DIAGNOSIS — G8111 Spastic hemiplegia affecting right dominant side: Secondary | ICD-10-CM | POA: Diagnosis not present

## 2020-11-18 DIAGNOSIS — M6389 Disorders of muscle in diseases classified elsewhere, multiple sites: Secondary | ICD-10-CM | POA: Diagnosis not present

## 2020-11-18 DIAGNOSIS — R293 Abnormal posture: Secondary | ICD-10-CM | POA: Diagnosis not present

## 2020-11-18 DIAGNOSIS — G8111 Spastic hemiplegia affecting right dominant side: Secondary | ICD-10-CM | POA: Diagnosis not present

## 2020-11-18 DIAGNOSIS — Z8673 Personal history of transient ischemic attack (TIA), and cerebral infarction without residual deficits: Secondary | ICD-10-CM | POA: Diagnosis not present

## 2020-11-19 DIAGNOSIS — I6389 Other cerebral infarction: Secondary | ICD-10-CM | POA: Diagnosis not present

## 2020-11-19 DIAGNOSIS — I69328 Other speech and language deficits following cerebral infarction: Secondary | ICD-10-CM | POA: Diagnosis not present

## 2020-11-19 DIAGNOSIS — G8111 Spastic hemiplegia affecting right dominant side: Secondary | ICD-10-CM | POA: Diagnosis not present

## 2020-11-20 DIAGNOSIS — R293 Abnormal posture: Secondary | ICD-10-CM | POA: Diagnosis not present

## 2020-11-20 DIAGNOSIS — G8111 Spastic hemiplegia affecting right dominant side: Secondary | ICD-10-CM | POA: Diagnosis not present

## 2020-11-20 DIAGNOSIS — Z8673 Personal history of transient ischemic attack (TIA), and cerebral infarction without residual deficits: Secondary | ICD-10-CM | POA: Diagnosis not present

## 2020-11-20 DIAGNOSIS — M6389 Disorders of muscle in diseases classified elsewhere, multiple sites: Secondary | ICD-10-CM | POA: Diagnosis not present

## 2020-11-24 DIAGNOSIS — I69328 Other speech and language deficits following cerebral infarction: Secondary | ICD-10-CM | POA: Diagnosis not present

## 2020-11-24 DIAGNOSIS — G8111 Spastic hemiplegia affecting right dominant side: Secondary | ICD-10-CM | POA: Diagnosis not present

## 2020-11-24 DIAGNOSIS — I6389 Other cerebral infarction: Secondary | ICD-10-CM | POA: Diagnosis not present

## 2020-11-25 DIAGNOSIS — G8111 Spastic hemiplegia affecting right dominant side: Secondary | ICD-10-CM | POA: Diagnosis not present

## 2020-11-25 DIAGNOSIS — Z8673 Personal history of transient ischemic attack (TIA), and cerebral infarction without residual deficits: Secondary | ICD-10-CM | POA: Diagnosis not present

## 2020-11-25 DIAGNOSIS — R293 Abnormal posture: Secondary | ICD-10-CM | POA: Diagnosis not present

## 2020-11-25 DIAGNOSIS — M6389 Disorders of muscle in diseases classified elsewhere, multiple sites: Secondary | ICD-10-CM | POA: Diagnosis not present

## 2020-11-27 DIAGNOSIS — Z8673 Personal history of transient ischemic attack (TIA), and cerebral infarction without residual deficits: Secondary | ICD-10-CM | POA: Diagnosis not present

## 2020-11-27 DIAGNOSIS — G8111 Spastic hemiplegia affecting right dominant side: Secondary | ICD-10-CM | POA: Diagnosis not present

## 2020-11-27 DIAGNOSIS — M6389 Disorders of muscle in diseases classified elsewhere, multiple sites: Secondary | ICD-10-CM | POA: Diagnosis not present

## 2020-11-27 DIAGNOSIS — R293 Abnormal posture: Secondary | ICD-10-CM | POA: Diagnosis not present

## 2020-11-28 DIAGNOSIS — G8111 Spastic hemiplegia affecting right dominant side: Secondary | ICD-10-CM | POA: Diagnosis not present

## 2020-11-28 DIAGNOSIS — I69328 Other speech and language deficits following cerebral infarction: Secondary | ICD-10-CM | POA: Diagnosis not present

## 2020-11-28 DIAGNOSIS — I6389 Other cerebral infarction: Secondary | ICD-10-CM | POA: Diagnosis not present

## 2020-12-01 DIAGNOSIS — I6389 Other cerebral infarction: Secondary | ICD-10-CM | POA: Diagnosis not present

## 2020-12-01 DIAGNOSIS — I69328 Other speech and language deficits following cerebral infarction: Secondary | ICD-10-CM | POA: Diagnosis not present

## 2020-12-01 DIAGNOSIS — G8111 Spastic hemiplegia affecting right dominant side: Secondary | ICD-10-CM | POA: Diagnosis not present

## 2020-12-02 DIAGNOSIS — G8111 Spastic hemiplegia affecting right dominant side: Secondary | ICD-10-CM | POA: Diagnosis not present

## 2020-12-02 DIAGNOSIS — M6389 Disorders of muscle in diseases classified elsewhere, multiple sites: Secondary | ICD-10-CM | POA: Diagnosis not present

## 2020-12-02 DIAGNOSIS — R293 Abnormal posture: Secondary | ICD-10-CM | POA: Diagnosis not present

## 2020-12-02 DIAGNOSIS — Z8673 Personal history of transient ischemic attack (TIA), and cerebral infarction without residual deficits: Secondary | ICD-10-CM | POA: Diagnosis not present

## 2020-12-04 DIAGNOSIS — M6389 Disorders of muscle in diseases classified elsewhere, multiple sites: Secondary | ICD-10-CM | POA: Diagnosis not present

## 2020-12-04 DIAGNOSIS — R293 Abnormal posture: Secondary | ICD-10-CM | POA: Diagnosis not present

## 2020-12-04 DIAGNOSIS — G8111 Spastic hemiplegia affecting right dominant side: Secondary | ICD-10-CM | POA: Diagnosis not present

## 2020-12-04 DIAGNOSIS — Z8673 Personal history of transient ischemic attack (TIA), and cerebral infarction without residual deficits: Secondary | ICD-10-CM | POA: Diagnosis not present

## 2020-12-05 DIAGNOSIS — I6389 Other cerebral infarction: Secondary | ICD-10-CM | POA: Diagnosis not present

## 2020-12-05 DIAGNOSIS — G8111 Spastic hemiplegia affecting right dominant side: Secondary | ICD-10-CM | POA: Diagnosis not present

## 2020-12-05 DIAGNOSIS — I69328 Other speech and language deficits following cerebral infarction: Secondary | ICD-10-CM | POA: Diagnosis not present

## 2020-12-09 ENCOUNTER — Ambulatory Visit: Payer: Medicare Other | Admitting: Neurology

## 2020-12-09 DIAGNOSIS — I69328 Other speech and language deficits following cerebral infarction: Secondary | ICD-10-CM | POA: Diagnosis not present

## 2020-12-09 DIAGNOSIS — R293 Abnormal posture: Secondary | ICD-10-CM | POA: Diagnosis not present

## 2020-12-09 DIAGNOSIS — Z8673 Personal history of transient ischemic attack (TIA), and cerebral infarction without residual deficits: Secondary | ICD-10-CM | POA: Diagnosis not present

## 2020-12-09 DIAGNOSIS — I6389 Other cerebral infarction: Secondary | ICD-10-CM | POA: Diagnosis not present

## 2020-12-09 DIAGNOSIS — M6389 Disorders of muscle in diseases classified elsewhere, multiple sites: Secondary | ICD-10-CM | POA: Diagnosis not present

## 2020-12-09 DIAGNOSIS — G8111 Spastic hemiplegia affecting right dominant side: Secondary | ICD-10-CM | POA: Diagnosis not present

## 2020-12-10 ENCOUNTER — Ambulatory Visit: Payer: Medicare Other | Admitting: Physical Medicine & Rehabilitation

## 2020-12-11 DIAGNOSIS — I6389 Other cerebral infarction: Secondary | ICD-10-CM | POA: Diagnosis not present

## 2020-12-11 DIAGNOSIS — G8111 Spastic hemiplegia affecting right dominant side: Secondary | ICD-10-CM | POA: Diagnosis not present

## 2020-12-11 DIAGNOSIS — R293 Abnormal posture: Secondary | ICD-10-CM | POA: Diagnosis not present

## 2020-12-11 DIAGNOSIS — M6389 Disorders of muscle in diseases classified elsewhere, multiple sites: Secondary | ICD-10-CM | POA: Diagnosis not present

## 2020-12-11 DIAGNOSIS — Z8673 Personal history of transient ischemic attack (TIA), and cerebral infarction without residual deficits: Secondary | ICD-10-CM | POA: Diagnosis not present

## 2020-12-11 DIAGNOSIS — I69328 Other speech and language deficits following cerebral infarction: Secondary | ICD-10-CM | POA: Diagnosis not present

## 2020-12-16 DIAGNOSIS — I6389 Other cerebral infarction: Secondary | ICD-10-CM | POA: Diagnosis not present

## 2020-12-16 DIAGNOSIS — R293 Abnormal posture: Secondary | ICD-10-CM | POA: Diagnosis not present

## 2020-12-16 DIAGNOSIS — I69328 Other speech and language deficits following cerebral infarction: Secondary | ICD-10-CM | POA: Diagnosis not present

## 2020-12-16 DIAGNOSIS — M6389 Disorders of muscle in diseases classified elsewhere, multiple sites: Secondary | ICD-10-CM | POA: Diagnosis not present

## 2020-12-16 DIAGNOSIS — Z8673 Personal history of transient ischemic attack (TIA), and cerebral infarction without residual deficits: Secondary | ICD-10-CM | POA: Diagnosis not present

## 2020-12-16 DIAGNOSIS — G8111 Spastic hemiplegia affecting right dominant side: Secondary | ICD-10-CM | POA: Diagnosis not present

## 2020-12-18 DIAGNOSIS — I6389 Other cerebral infarction: Secondary | ICD-10-CM | POA: Diagnosis not present

## 2020-12-18 DIAGNOSIS — G8111 Spastic hemiplegia affecting right dominant side: Secondary | ICD-10-CM | POA: Diagnosis not present

## 2020-12-18 DIAGNOSIS — I69328 Other speech and language deficits following cerebral infarction: Secondary | ICD-10-CM | POA: Diagnosis not present

## 2020-12-18 DIAGNOSIS — R293 Abnormal posture: Secondary | ICD-10-CM | POA: Diagnosis not present

## 2020-12-18 DIAGNOSIS — Z8673 Personal history of transient ischemic attack (TIA), and cerebral infarction without residual deficits: Secondary | ICD-10-CM | POA: Diagnosis not present

## 2020-12-18 DIAGNOSIS — M6389 Disorders of muscle in diseases classified elsewhere, multiple sites: Secondary | ICD-10-CM | POA: Diagnosis not present

## 2020-12-23 DIAGNOSIS — Z8673 Personal history of transient ischemic attack (TIA), and cerebral infarction without residual deficits: Secondary | ICD-10-CM | POA: Diagnosis not present

## 2020-12-23 DIAGNOSIS — I69328 Other speech and language deficits following cerebral infarction: Secondary | ICD-10-CM | POA: Diagnosis not present

## 2020-12-23 DIAGNOSIS — G8111 Spastic hemiplegia affecting right dominant side: Secondary | ICD-10-CM | POA: Diagnosis not present

## 2020-12-23 DIAGNOSIS — R293 Abnormal posture: Secondary | ICD-10-CM | POA: Diagnosis not present

## 2020-12-23 DIAGNOSIS — I6389 Other cerebral infarction: Secondary | ICD-10-CM | POA: Diagnosis not present

## 2020-12-23 DIAGNOSIS — M6389 Disorders of muscle in diseases classified elsewhere, multiple sites: Secondary | ICD-10-CM | POA: Diagnosis not present

## 2020-12-26 DIAGNOSIS — R293 Abnormal posture: Secondary | ICD-10-CM | POA: Diagnosis not present

## 2020-12-26 DIAGNOSIS — Z8673 Personal history of transient ischemic attack (TIA), and cerebral infarction without residual deficits: Secondary | ICD-10-CM | POA: Diagnosis not present

## 2020-12-26 DIAGNOSIS — M6389 Disorders of muscle in diseases classified elsewhere, multiple sites: Secondary | ICD-10-CM | POA: Diagnosis not present

## 2020-12-26 DIAGNOSIS — I69328 Other speech and language deficits following cerebral infarction: Secondary | ICD-10-CM | POA: Diagnosis not present

## 2020-12-26 DIAGNOSIS — I6389 Other cerebral infarction: Secondary | ICD-10-CM | POA: Diagnosis not present

## 2020-12-26 DIAGNOSIS — G8111 Spastic hemiplegia affecting right dominant side: Secondary | ICD-10-CM | POA: Diagnosis not present

## 2020-12-30 DIAGNOSIS — M6389 Disorders of muscle in diseases classified elsewhere, multiple sites: Secondary | ICD-10-CM | POA: Diagnosis not present

## 2020-12-30 DIAGNOSIS — Z8673 Personal history of transient ischemic attack (TIA), and cerebral infarction without residual deficits: Secondary | ICD-10-CM | POA: Diagnosis not present

## 2020-12-30 DIAGNOSIS — R293 Abnormal posture: Secondary | ICD-10-CM | POA: Diagnosis not present

## 2020-12-30 DIAGNOSIS — I6389 Other cerebral infarction: Secondary | ICD-10-CM | POA: Diagnosis not present

## 2020-12-30 DIAGNOSIS — I69328 Other speech and language deficits following cerebral infarction: Secondary | ICD-10-CM | POA: Diagnosis not present

## 2020-12-30 DIAGNOSIS — G8111 Spastic hemiplegia affecting right dominant side: Secondary | ICD-10-CM | POA: Diagnosis not present

## 2021-01-01 DIAGNOSIS — M6389 Disorders of muscle in diseases classified elsewhere, multiple sites: Secondary | ICD-10-CM | POA: Diagnosis not present

## 2021-01-01 DIAGNOSIS — I69328 Other speech and language deficits following cerebral infarction: Secondary | ICD-10-CM | POA: Diagnosis not present

## 2021-01-01 DIAGNOSIS — Z8673 Personal history of transient ischemic attack (TIA), and cerebral infarction without residual deficits: Secondary | ICD-10-CM | POA: Diagnosis not present

## 2021-01-01 DIAGNOSIS — G8111 Spastic hemiplegia affecting right dominant side: Secondary | ICD-10-CM | POA: Diagnosis not present

## 2021-01-01 DIAGNOSIS — I6389 Other cerebral infarction: Secondary | ICD-10-CM | POA: Diagnosis not present

## 2021-01-01 DIAGNOSIS — R293 Abnormal posture: Secondary | ICD-10-CM | POA: Diagnosis not present

## 2021-01-06 DIAGNOSIS — I6389 Other cerebral infarction: Secondary | ICD-10-CM | POA: Diagnosis not present

## 2021-01-06 DIAGNOSIS — I69328 Other speech and language deficits following cerebral infarction: Secondary | ICD-10-CM | POA: Diagnosis not present

## 2021-01-06 DIAGNOSIS — R293 Abnormal posture: Secondary | ICD-10-CM | POA: Diagnosis not present

## 2021-01-06 DIAGNOSIS — Z8673 Personal history of transient ischemic attack (TIA), and cerebral infarction without residual deficits: Secondary | ICD-10-CM | POA: Diagnosis not present

## 2021-01-06 DIAGNOSIS — M6389 Disorders of muscle in diseases classified elsewhere, multiple sites: Secondary | ICD-10-CM | POA: Diagnosis not present

## 2021-01-06 DIAGNOSIS — G8111 Spastic hemiplegia affecting right dominant side: Secondary | ICD-10-CM | POA: Diagnosis not present

## 2021-01-08 DIAGNOSIS — R293 Abnormal posture: Secondary | ICD-10-CM | POA: Diagnosis not present

## 2021-01-08 DIAGNOSIS — G8111 Spastic hemiplegia affecting right dominant side: Secondary | ICD-10-CM | POA: Diagnosis not present

## 2021-01-08 DIAGNOSIS — I6389 Other cerebral infarction: Secondary | ICD-10-CM | POA: Diagnosis not present

## 2021-01-08 DIAGNOSIS — I69328 Other speech and language deficits following cerebral infarction: Secondary | ICD-10-CM | POA: Diagnosis not present

## 2021-01-08 DIAGNOSIS — Z8673 Personal history of transient ischemic attack (TIA), and cerebral infarction without residual deficits: Secondary | ICD-10-CM | POA: Diagnosis not present

## 2021-01-08 DIAGNOSIS — M6389 Disorders of muscle in diseases classified elsewhere, multiple sites: Secondary | ICD-10-CM | POA: Diagnosis not present

## 2021-01-09 DIAGNOSIS — E785 Hyperlipidemia, unspecified: Secondary | ICD-10-CM | POA: Diagnosis not present

## 2021-01-09 DIAGNOSIS — E559 Vitamin D deficiency, unspecified: Secondary | ICD-10-CM | POA: Diagnosis not present

## 2021-01-09 DIAGNOSIS — R946 Abnormal results of thyroid function studies: Secondary | ICD-10-CM | POA: Diagnosis not present

## 2021-01-13 DIAGNOSIS — R293 Abnormal posture: Secondary | ICD-10-CM | POA: Diagnosis not present

## 2021-01-13 DIAGNOSIS — I69328 Other speech and language deficits following cerebral infarction: Secondary | ICD-10-CM | POA: Diagnosis not present

## 2021-01-13 DIAGNOSIS — I6389 Other cerebral infarction: Secondary | ICD-10-CM | POA: Diagnosis not present

## 2021-01-13 DIAGNOSIS — G8111 Spastic hemiplegia affecting right dominant side: Secondary | ICD-10-CM | POA: Diagnosis not present

## 2021-01-13 DIAGNOSIS — Z8673 Personal history of transient ischemic attack (TIA), and cerebral infarction without residual deficits: Secondary | ICD-10-CM | POA: Diagnosis not present

## 2021-01-13 DIAGNOSIS — M6389 Disorders of muscle in diseases classified elsewhere, multiple sites: Secondary | ICD-10-CM | POA: Diagnosis not present

## 2021-01-15 DIAGNOSIS — I6389 Other cerebral infarction: Secondary | ICD-10-CM | POA: Diagnosis not present

## 2021-01-15 DIAGNOSIS — Z8673 Personal history of transient ischemic attack (TIA), and cerebral infarction without residual deficits: Secondary | ICD-10-CM | POA: Diagnosis not present

## 2021-01-15 DIAGNOSIS — I69328 Other speech and language deficits following cerebral infarction: Secondary | ICD-10-CM | POA: Diagnosis not present

## 2021-01-15 DIAGNOSIS — G8111 Spastic hemiplegia affecting right dominant side: Secondary | ICD-10-CM | POA: Diagnosis not present

## 2021-01-15 DIAGNOSIS — M6389 Disorders of muscle in diseases classified elsewhere, multiple sites: Secondary | ICD-10-CM | POA: Diagnosis not present

## 2021-01-15 DIAGNOSIS — R293 Abnormal posture: Secondary | ICD-10-CM | POA: Diagnosis not present

## 2021-01-16 DIAGNOSIS — I10 Essential (primary) hypertension: Secondary | ICD-10-CM | POA: Diagnosis not present

## 2021-01-16 DIAGNOSIS — Z79899 Other long term (current) drug therapy: Secondary | ICD-10-CM | POA: Diagnosis not present

## 2021-01-16 DIAGNOSIS — I340 Nonrheumatic mitral (valve) insufficiency: Secondary | ICD-10-CM | POA: Diagnosis not present

## 2021-01-16 DIAGNOSIS — Z1339 Encounter for screening examination for other mental health and behavioral disorders: Secondary | ICD-10-CM | POA: Diagnosis not present

## 2021-01-16 DIAGNOSIS — J45909 Unspecified asthma, uncomplicated: Secondary | ICD-10-CM | POA: Diagnosis not present

## 2021-01-16 DIAGNOSIS — K5790 Diverticulosis of intestine, part unspecified, without perforation or abscess without bleeding: Secondary | ICD-10-CM | POA: Diagnosis not present

## 2021-01-16 DIAGNOSIS — I69359 Hemiplegia and hemiparesis following cerebral infarction affecting unspecified side: Secondary | ICD-10-CM | POA: Diagnosis not present

## 2021-01-16 DIAGNOSIS — Z0000 Encounter for general adult medical examination without abnormal findings: Secondary | ICD-10-CM | POA: Diagnosis not present

## 2021-01-16 DIAGNOSIS — G8110 Spastic hemiplegia affecting unspecified side: Secondary | ICD-10-CM | POA: Diagnosis not present

## 2021-01-16 DIAGNOSIS — I517 Cardiomegaly: Secondary | ICD-10-CM | POA: Diagnosis not present

## 2021-01-16 DIAGNOSIS — Z1331 Encounter for screening for depression: Secondary | ICD-10-CM | POA: Diagnosis not present

## 2021-01-16 DIAGNOSIS — Z23 Encounter for immunization: Secondary | ICD-10-CM | POA: Diagnosis not present

## 2021-01-16 DIAGNOSIS — I6389 Other cerebral infarction: Secondary | ICD-10-CM | POA: Diagnosis not present

## 2021-01-16 DIAGNOSIS — I350 Nonrheumatic aortic (valve) stenosis: Secondary | ICD-10-CM | POA: Diagnosis not present

## 2021-01-16 DIAGNOSIS — R82998 Other abnormal findings in urine: Secondary | ICD-10-CM | POA: Diagnosis not present

## 2021-01-16 DIAGNOSIS — E785 Hyperlipidemia, unspecified: Secondary | ICD-10-CM | POA: Diagnosis not present

## 2021-01-19 ENCOUNTER — Other Ambulatory Visit (HOSPITAL_COMMUNITY): Payer: Self-pay | Admitting: Internal Medicine

## 2021-01-19 DIAGNOSIS — I350 Nonrheumatic aortic (valve) stenosis: Secondary | ICD-10-CM

## 2021-01-20 DIAGNOSIS — G8111 Spastic hemiplegia affecting right dominant side: Secondary | ICD-10-CM | POA: Diagnosis not present

## 2021-01-20 DIAGNOSIS — Z8673 Personal history of transient ischemic attack (TIA), and cerebral infarction without residual deficits: Secondary | ICD-10-CM | POA: Diagnosis not present

## 2021-01-20 DIAGNOSIS — I69328 Other speech and language deficits following cerebral infarction: Secondary | ICD-10-CM | POA: Diagnosis not present

## 2021-01-20 DIAGNOSIS — I6389 Other cerebral infarction: Secondary | ICD-10-CM | POA: Diagnosis not present

## 2021-01-20 DIAGNOSIS — M6389 Disorders of muscle in diseases classified elsewhere, multiple sites: Secondary | ICD-10-CM | POA: Diagnosis not present

## 2021-01-20 DIAGNOSIS — R293 Abnormal posture: Secondary | ICD-10-CM | POA: Diagnosis not present

## 2021-01-22 DIAGNOSIS — R293 Abnormal posture: Secondary | ICD-10-CM | POA: Diagnosis not present

## 2021-01-22 DIAGNOSIS — I69328 Other speech and language deficits following cerebral infarction: Secondary | ICD-10-CM | POA: Diagnosis not present

## 2021-01-22 DIAGNOSIS — G8111 Spastic hemiplegia affecting right dominant side: Secondary | ICD-10-CM | POA: Diagnosis not present

## 2021-01-22 DIAGNOSIS — I6389 Other cerebral infarction: Secondary | ICD-10-CM | POA: Diagnosis not present

## 2021-01-22 DIAGNOSIS — M6389 Disorders of muscle in diseases classified elsewhere, multiple sites: Secondary | ICD-10-CM | POA: Diagnosis not present

## 2021-01-22 DIAGNOSIS — Z8673 Personal history of transient ischemic attack (TIA), and cerebral infarction without residual deficits: Secondary | ICD-10-CM | POA: Diagnosis not present

## 2021-01-27 DIAGNOSIS — Z8673 Personal history of transient ischemic attack (TIA), and cerebral infarction without residual deficits: Secondary | ICD-10-CM | POA: Diagnosis not present

## 2021-01-27 DIAGNOSIS — M6389 Disorders of muscle in diseases classified elsewhere, multiple sites: Secondary | ICD-10-CM | POA: Diagnosis not present

## 2021-01-27 DIAGNOSIS — G8111 Spastic hemiplegia affecting right dominant side: Secondary | ICD-10-CM | POA: Diagnosis not present

## 2021-01-27 DIAGNOSIS — I6389 Other cerebral infarction: Secondary | ICD-10-CM | POA: Diagnosis not present

## 2021-01-27 DIAGNOSIS — R293 Abnormal posture: Secondary | ICD-10-CM | POA: Diagnosis not present

## 2021-01-27 DIAGNOSIS — I69328 Other speech and language deficits following cerebral infarction: Secondary | ICD-10-CM | POA: Diagnosis not present

## 2021-01-29 ENCOUNTER — Ambulatory Visit (HOSPITAL_COMMUNITY): Payer: Medicare Other | Attending: Cardiology

## 2021-01-29 ENCOUNTER — Other Ambulatory Visit: Payer: Self-pay

## 2021-01-29 DIAGNOSIS — I350 Nonrheumatic aortic (valve) stenosis: Secondary | ICD-10-CM | POA: Diagnosis not present

## 2021-01-29 LAB — ECHOCARDIOGRAM COMPLETE
AR max vel: 0.8 cm2
AV Area VTI: 0.89 cm2
AV Area mean vel: 0.82 cm2
AV Mean grad: 25 mmHg
AV Peak grad: 47.1 mmHg
Ao pk vel: 3.43 m/s
Area-P 1/2: 2.57 cm2
P 1/2 time: 564 msec
S' Lateral: 2.4 cm

## 2021-01-30 DIAGNOSIS — G8111 Spastic hemiplegia affecting right dominant side: Secondary | ICD-10-CM | POA: Diagnosis not present

## 2021-01-30 DIAGNOSIS — Z8673 Personal history of transient ischemic attack (TIA), and cerebral infarction without residual deficits: Secondary | ICD-10-CM | POA: Diagnosis not present

## 2021-01-30 DIAGNOSIS — Z23 Encounter for immunization: Secondary | ICD-10-CM | POA: Diagnosis not present

## 2021-01-30 DIAGNOSIS — M6389 Disorders of muscle in diseases classified elsewhere, multiple sites: Secondary | ICD-10-CM | POA: Diagnosis not present

## 2021-01-30 DIAGNOSIS — R293 Abnormal posture: Secondary | ICD-10-CM | POA: Diagnosis not present

## 2021-01-30 DIAGNOSIS — I69328 Other speech and language deficits following cerebral infarction: Secondary | ICD-10-CM | POA: Diagnosis not present

## 2021-01-30 DIAGNOSIS — I6389 Other cerebral infarction: Secondary | ICD-10-CM | POA: Diagnosis not present

## 2021-02-03 DIAGNOSIS — G8111 Spastic hemiplegia affecting right dominant side: Secondary | ICD-10-CM | POA: Diagnosis not present

## 2021-02-03 DIAGNOSIS — I6389 Other cerebral infarction: Secondary | ICD-10-CM | POA: Diagnosis not present

## 2021-02-03 DIAGNOSIS — Z8673 Personal history of transient ischemic attack (TIA), and cerebral infarction without residual deficits: Secondary | ICD-10-CM | POA: Diagnosis not present

## 2021-02-03 DIAGNOSIS — R293 Abnormal posture: Secondary | ICD-10-CM | POA: Diagnosis not present

## 2021-02-03 DIAGNOSIS — I69328 Other speech and language deficits following cerebral infarction: Secondary | ICD-10-CM | POA: Diagnosis not present

## 2021-02-03 DIAGNOSIS — M6389 Disorders of muscle in diseases classified elsewhere, multiple sites: Secondary | ICD-10-CM | POA: Diagnosis not present

## 2021-02-05 DIAGNOSIS — I69328 Other speech and language deficits following cerebral infarction: Secondary | ICD-10-CM | POA: Diagnosis not present

## 2021-02-05 DIAGNOSIS — G8111 Spastic hemiplegia affecting right dominant side: Secondary | ICD-10-CM | POA: Diagnosis not present

## 2021-02-05 DIAGNOSIS — R293 Abnormal posture: Secondary | ICD-10-CM | POA: Diagnosis not present

## 2021-02-05 DIAGNOSIS — Z8673 Personal history of transient ischemic attack (TIA), and cerebral infarction without residual deficits: Secondary | ICD-10-CM | POA: Diagnosis not present

## 2021-02-05 DIAGNOSIS — M6389 Disorders of muscle in diseases classified elsewhere, multiple sites: Secondary | ICD-10-CM | POA: Diagnosis not present

## 2021-02-05 DIAGNOSIS — I6389 Other cerebral infarction: Secondary | ICD-10-CM | POA: Diagnosis not present

## 2021-02-11 ENCOUNTER — Ambulatory Visit: Payer: Medicare Other | Admitting: Physical Medicine & Rehabilitation

## 2021-02-12 DIAGNOSIS — M6389 Disorders of muscle in diseases classified elsewhere, multiple sites: Secondary | ICD-10-CM | POA: Diagnosis not present

## 2021-02-12 DIAGNOSIS — Z8673 Personal history of transient ischemic attack (TIA), and cerebral infarction without residual deficits: Secondary | ICD-10-CM | POA: Diagnosis not present

## 2021-02-12 DIAGNOSIS — R293 Abnormal posture: Secondary | ICD-10-CM | POA: Diagnosis not present

## 2021-02-12 DIAGNOSIS — G8111 Spastic hemiplegia affecting right dominant side: Secondary | ICD-10-CM | POA: Diagnosis not present

## 2021-02-17 DIAGNOSIS — M6389 Disorders of muscle in diseases classified elsewhere, multiple sites: Secondary | ICD-10-CM | POA: Diagnosis not present

## 2021-02-17 DIAGNOSIS — I6389 Other cerebral infarction: Secondary | ICD-10-CM | POA: Diagnosis not present

## 2021-02-17 DIAGNOSIS — Z8673 Personal history of transient ischemic attack (TIA), and cerebral infarction without residual deficits: Secondary | ICD-10-CM | POA: Diagnosis not present

## 2021-02-17 DIAGNOSIS — R293 Abnormal posture: Secondary | ICD-10-CM | POA: Diagnosis not present

## 2021-02-17 DIAGNOSIS — I69328 Other speech and language deficits following cerebral infarction: Secondary | ICD-10-CM | POA: Diagnosis not present

## 2021-02-17 DIAGNOSIS — G8111 Spastic hemiplegia affecting right dominant side: Secondary | ICD-10-CM | POA: Diagnosis not present

## 2021-02-18 ENCOUNTER — Encounter: Payer: Self-pay | Admitting: Physical Medicine & Rehabilitation

## 2021-02-18 ENCOUNTER — Encounter: Payer: Medicare Other | Attending: Physical Medicine & Rehabilitation | Admitting: Physical Medicine & Rehabilitation

## 2021-02-18 ENCOUNTER — Other Ambulatory Visit: Payer: Self-pay

## 2021-02-18 VITALS — BP 125/70 | HR 67 | Ht 65.0 in | Wt 116.0 lb

## 2021-02-18 DIAGNOSIS — G8111 Spastic hemiplegia affecting right dominant side: Secondary | ICD-10-CM | POA: Diagnosis not present

## 2021-02-18 DIAGNOSIS — G8110 Spastic hemiplegia affecting unspecified side: Secondary | ICD-10-CM | POA: Diagnosis not present

## 2021-02-18 NOTE — Patient Instructions (Signed)
PLEASE FEEL FREE TO CALL OUR OFFICE WITH ANY PROBLEMS OR QUESTIONS ([phone])      

## 2021-02-18 NOTE — Progress Notes (Signed)
Botox Injection for spasticity of upper extremity using needle EMG guidance Indication: Spastic hemiplegia, dominant side (HCC)   Dilution: 100 Units/ml        Total Units Injected: 500 Indication: Severe spasticity which interferes with ADL,mobility and/or  hygiene and is unresponsive to medication management and other conservative care Informed consent was obtained after describing risks and benefits of the procedure with the patient. This includes bleeding, bruising, infection, excessive weakness, or medication side effects. A REMS form is on file and signed.  Needle: 36mm injectable monopolar needle electrode    Number of units per muscle Pectoralis Major 75 units Pectoralis Minor 75 units Teres major 25u, teres minor 25u Biceps 100 units Brachioradialis 50 units FCR 0 units FCU 0 units FDS 25 units FDP 25 units FPL 0 units Palmaris Longus 0 units Pronator Teres 100 units Pronator Quadratus 0 units Lumbricals 0 units All injections were done after obtaining appropriate EMG activity and after negative drawback for blood. The patient tolerated the procedure well. Post procedure instructions were given. Return in about 3 months (around 05/21/2021) for botox 500u right pecs, biceps, brachioradialis, pronators, finger flexors.

## 2021-02-19 DIAGNOSIS — G8111 Spastic hemiplegia affecting right dominant side: Secondary | ICD-10-CM | POA: Diagnosis not present

## 2021-02-19 DIAGNOSIS — Z8673 Personal history of transient ischemic attack (TIA), and cerebral infarction without residual deficits: Secondary | ICD-10-CM | POA: Diagnosis not present

## 2021-02-19 DIAGNOSIS — R293 Abnormal posture: Secondary | ICD-10-CM | POA: Diagnosis not present

## 2021-02-19 DIAGNOSIS — M6389 Disorders of muscle in diseases classified elsewhere, multiple sites: Secondary | ICD-10-CM | POA: Diagnosis not present

## 2021-02-20 DIAGNOSIS — I69328 Other speech and language deficits following cerebral infarction: Secondary | ICD-10-CM | POA: Diagnosis not present

## 2021-02-20 DIAGNOSIS — I6389 Other cerebral infarction: Secondary | ICD-10-CM | POA: Diagnosis not present

## 2021-02-20 DIAGNOSIS — G8111 Spastic hemiplegia affecting right dominant side: Secondary | ICD-10-CM | POA: Diagnosis not present

## 2021-02-24 DIAGNOSIS — M6389 Disorders of muscle in diseases classified elsewhere, multiple sites: Secondary | ICD-10-CM | POA: Diagnosis not present

## 2021-02-24 DIAGNOSIS — G8111 Spastic hemiplegia affecting right dominant side: Secondary | ICD-10-CM | POA: Diagnosis not present

## 2021-02-24 DIAGNOSIS — I69328 Other speech and language deficits following cerebral infarction: Secondary | ICD-10-CM | POA: Diagnosis not present

## 2021-02-24 DIAGNOSIS — I6389 Other cerebral infarction: Secondary | ICD-10-CM | POA: Diagnosis not present

## 2021-02-24 DIAGNOSIS — Z8673 Personal history of transient ischemic attack (TIA), and cerebral infarction without residual deficits: Secondary | ICD-10-CM | POA: Diagnosis not present

## 2021-02-24 DIAGNOSIS — R293 Abnormal posture: Secondary | ICD-10-CM | POA: Diagnosis not present

## 2021-02-26 DIAGNOSIS — G8111 Spastic hemiplegia affecting right dominant side: Secondary | ICD-10-CM | POA: Diagnosis not present

## 2021-02-26 DIAGNOSIS — I69328 Other speech and language deficits following cerebral infarction: Secondary | ICD-10-CM | POA: Diagnosis not present

## 2021-02-26 DIAGNOSIS — I6389 Other cerebral infarction: Secondary | ICD-10-CM | POA: Diagnosis not present

## 2021-02-27 DIAGNOSIS — M6389 Disorders of muscle in diseases classified elsewhere, multiple sites: Secondary | ICD-10-CM | POA: Diagnosis not present

## 2021-02-27 DIAGNOSIS — Z8673 Personal history of transient ischemic attack (TIA), and cerebral infarction without residual deficits: Secondary | ICD-10-CM | POA: Diagnosis not present

## 2021-02-27 DIAGNOSIS — G8111 Spastic hemiplegia affecting right dominant side: Secondary | ICD-10-CM | POA: Diagnosis not present

## 2021-02-27 DIAGNOSIS — R293 Abnormal posture: Secondary | ICD-10-CM | POA: Diagnosis not present

## 2021-03-03 DIAGNOSIS — I69328 Other speech and language deficits following cerebral infarction: Secondary | ICD-10-CM | POA: Diagnosis not present

## 2021-03-03 DIAGNOSIS — G8111 Spastic hemiplegia affecting right dominant side: Secondary | ICD-10-CM | POA: Diagnosis not present

## 2021-03-03 DIAGNOSIS — I6389 Other cerebral infarction: Secondary | ICD-10-CM | POA: Diagnosis not present

## 2021-03-04 DIAGNOSIS — D1801 Hemangioma of skin and subcutaneous tissue: Secondary | ICD-10-CM | POA: Diagnosis not present

## 2021-03-04 DIAGNOSIS — L239 Allergic contact dermatitis, unspecified cause: Secondary | ICD-10-CM | POA: Diagnosis not present

## 2021-03-04 DIAGNOSIS — L821 Other seborrheic keratosis: Secondary | ICD-10-CM | POA: Diagnosis not present

## 2021-03-05 DIAGNOSIS — I6389 Other cerebral infarction: Secondary | ICD-10-CM | POA: Diagnosis not present

## 2021-03-05 DIAGNOSIS — I69328 Other speech and language deficits following cerebral infarction: Secondary | ICD-10-CM | POA: Diagnosis not present

## 2021-03-05 DIAGNOSIS — G8111 Spastic hemiplegia affecting right dominant side: Secondary | ICD-10-CM | POA: Diagnosis not present

## 2021-03-16 DIAGNOSIS — I6389 Other cerebral infarction: Secondary | ICD-10-CM | POA: Diagnosis not present

## 2021-03-16 DIAGNOSIS — I69328 Other speech and language deficits following cerebral infarction: Secondary | ICD-10-CM | POA: Diagnosis not present

## 2021-03-16 DIAGNOSIS — G8111 Spastic hemiplegia affecting right dominant side: Secondary | ICD-10-CM | POA: Diagnosis not present

## 2021-03-19 DIAGNOSIS — I6389 Other cerebral infarction: Secondary | ICD-10-CM | POA: Diagnosis not present

## 2021-03-19 DIAGNOSIS — I69328 Other speech and language deficits following cerebral infarction: Secondary | ICD-10-CM | POA: Diagnosis not present

## 2021-03-19 DIAGNOSIS — G8111 Spastic hemiplegia affecting right dominant side: Secondary | ICD-10-CM | POA: Diagnosis not present

## 2021-03-20 DIAGNOSIS — H52203 Unspecified astigmatism, bilateral: Secondary | ICD-10-CM | POA: Diagnosis not present

## 2021-03-20 DIAGNOSIS — H2512 Age-related nuclear cataract, left eye: Secondary | ICD-10-CM | POA: Diagnosis not present

## 2021-03-20 DIAGNOSIS — H401112 Primary open-angle glaucoma, right eye, moderate stage: Secondary | ICD-10-CM | POA: Diagnosis not present

## 2021-03-24 DIAGNOSIS — I69328 Other speech and language deficits following cerebral infarction: Secondary | ICD-10-CM | POA: Diagnosis not present

## 2021-03-24 DIAGNOSIS — G8111 Spastic hemiplegia affecting right dominant side: Secondary | ICD-10-CM | POA: Diagnosis not present

## 2021-03-24 DIAGNOSIS — I6389 Other cerebral infarction: Secondary | ICD-10-CM | POA: Diagnosis not present

## 2021-03-26 DIAGNOSIS — I69328 Other speech and language deficits following cerebral infarction: Secondary | ICD-10-CM | POA: Diagnosis not present

## 2021-03-26 DIAGNOSIS — I6389 Other cerebral infarction: Secondary | ICD-10-CM | POA: Diagnosis not present

## 2021-03-26 DIAGNOSIS — G8111 Spastic hemiplegia affecting right dominant side: Secondary | ICD-10-CM | POA: Diagnosis not present

## 2021-03-31 DIAGNOSIS — G8111 Spastic hemiplegia affecting right dominant side: Secondary | ICD-10-CM | POA: Diagnosis not present

## 2021-03-31 DIAGNOSIS — Z8673 Personal history of transient ischemic attack (TIA), and cerebral infarction without residual deficits: Secondary | ICD-10-CM | POA: Diagnosis not present

## 2021-03-31 DIAGNOSIS — I6389 Other cerebral infarction: Secondary | ICD-10-CM | POA: Diagnosis not present

## 2021-03-31 DIAGNOSIS — R293 Abnormal posture: Secondary | ICD-10-CM | POA: Diagnosis not present

## 2021-03-31 DIAGNOSIS — I69328 Other speech and language deficits following cerebral infarction: Secondary | ICD-10-CM | POA: Diagnosis not present

## 2021-03-31 DIAGNOSIS — M6389 Disorders of muscle in diseases classified elsewhere, multiple sites: Secondary | ICD-10-CM | POA: Diagnosis not present

## 2021-04-02 DIAGNOSIS — G8111 Spastic hemiplegia affecting right dominant side: Secondary | ICD-10-CM | POA: Diagnosis not present

## 2021-04-02 DIAGNOSIS — I6389 Other cerebral infarction: Secondary | ICD-10-CM | POA: Diagnosis not present

## 2021-04-02 DIAGNOSIS — I69328 Other speech and language deficits following cerebral infarction: Secondary | ICD-10-CM | POA: Diagnosis not present

## 2021-04-07 DIAGNOSIS — G8111 Spastic hemiplegia affecting right dominant side: Secondary | ICD-10-CM | POA: Diagnosis not present

## 2021-04-07 DIAGNOSIS — I6389 Other cerebral infarction: Secondary | ICD-10-CM | POA: Diagnosis not present

## 2021-04-07 DIAGNOSIS — I69328 Other speech and language deficits following cerebral infarction: Secondary | ICD-10-CM | POA: Diagnosis not present

## 2021-04-09 DIAGNOSIS — G8111 Spastic hemiplegia affecting right dominant side: Secondary | ICD-10-CM | POA: Diagnosis not present

## 2021-04-09 DIAGNOSIS — I69328 Other speech and language deficits following cerebral infarction: Secondary | ICD-10-CM | POA: Diagnosis not present

## 2021-04-09 DIAGNOSIS — I6389 Other cerebral infarction: Secondary | ICD-10-CM | POA: Diagnosis not present

## 2021-04-16 DIAGNOSIS — M6389 Disorders of muscle in diseases classified elsewhere, multiple sites: Secondary | ICD-10-CM | POA: Diagnosis not present

## 2021-04-16 DIAGNOSIS — R293 Abnormal posture: Secondary | ICD-10-CM | POA: Diagnosis not present

## 2021-04-16 DIAGNOSIS — Z8673 Personal history of transient ischemic attack (TIA), and cerebral infarction without residual deficits: Secondary | ICD-10-CM | POA: Diagnosis not present

## 2021-04-16 DIAGNOSIS — G8111 Spastic hemiplegia affecting right dominant side: Secondary | ICD-10-CM | POA: Diagnosis not present

## 2021-04-21 DIAGNOSIS — G8111 Spastic hemiplegia affecting right dominant side: Secondary | ICD-10-CM | POA: Diagnosis not present

## 2021-04-21 DIAGNOSIS — I6389 Other cerebral infarction: Secondary | ICD-10-CM | POA: Diagnosis not present

## 2021-04-21 DIAGNOSIS — I69328 Other speech and language deficits following cerebral infarction: Secondary | ICD-10-CM | POA: Diagnosis not present

## 2021-04-24 DIAGNOSIS — G8111 Spastic hemiplegia affecting right dominant side: Secondary | ICD-10-CM | POA: Diagnosis not present

## 2021-04-24 DIAGNOSIS — I6389 Other cerebral infarction: Secondary | ICD-10-CM | POA: Diagnosis not present

## 2021-04-24 DIAGNOSIS — I69328 Other speech and language deficits following cerebral infarction: Secondary | ICD-10-CM | POA: Diagnosis not present

## 2021-04-30 DIAGNOSIS — G8111 Spastic hemiplegia affecting right dominant side: Secondary | ICD-10-CM | POA: Diagnosis not present

## 2021-04-30 DIAGNOSIS — I69328 Other speech and language deficits following cerebral infarction: Secondary | ICD-10-CM | POA: Diagnosis not present

## 2021-04-30 DIAGNOSIS — I6389 Other cerebral infarction: Secondary | ICD-10-CM | POA: Diagnosis not present

## 2021-05-01 DIAGNOSIS — G8111 Spastic hemiplegia affecting right dominant side: Secondary | ICD-10-CM | POA: Diagnosis not present

## 2021-05-01 DIAGNOSIS — I69328 Other speech and language deficits following cerebral infarction: Secondary | ICD-10-CM | POA: Diagnosis not present

## 2021-05-01 DIAGNOSIS — I6389 Other cerebral infarction: Secondary | ICD-10-CM | POA: Diagnosis not present

## 2021-05-04 DIAGNOSIS — I6389 Other cerebral infarction: Secondary | ICD-10-CM | POA: Diagnosis not present

## 2021-05-04 DIAGNOSIS — I69328 Other speech and language deficits following cerebral infarction: Secondary | ICD-10-CM | POA: Diagnosis not present

## 2021-05-04 DIAGNOSIS — G8111 Spastic hemiplegia affecting right dominant side: Secondary | ICD-10-CM | POA: Diagnosis not present

## 2021-05-08 DIAGNOSIS — I6389 Other cerebral infarction: Secondary | ICD-10-CM | POA: Diagnosis not present

## 2021-05-08 DIAGNOSIS — G8111 Spastic hemiplegia affecting right dominant side: Secondary | ICD-10-CM | POA: Diagnosis not present

## 2021-05-08 DIAGNOSIS — I69328 Other speech and language deficits following cerebral infarction: Secondary | ICD-10-CM | POA: Diagnosis not present

## 2021-05-22 DIAGNOSIS — I6389 Other cerebral infarction: Secondary | ICD-10-CM | POA: Diagnosis not present

## 2021-05-22 DIAGNOSIS — I69328 Other speech and language deficits following cerebral infarction: Secondary | ICD-10-CM | POA: Diagnosis not present

## 2021-05-22 DIAGNOSIS — G8111 Spastic hemiplegia affecting right dominant side: Secondary | ICD-10-CM | POA: Diagnosis not present

## 2021-05-26 DIAGNOSIS — I69328 Other speech and language deficits following cerebral infarction: Secondary | ICD-10-CM | POA: Diagnosis not present

## 2021-05-26 DIAGNOSIS — I6389 Other cerebral infarction: Secondary | ICD-10-CM | POA: Diagnosis not present

## 2021-05-26 DIAGNOSIS — G8111 Spastic hemiplegia affecting right dominant side: Secondary | ICD-10-CM | POA: Diagnosis not present

## 2021-05-27 ENCOUNTER — Encounter: Payer: Medicare Other | Attending: Physical Medicine & Rehabilitation | Admitting: Physical Medicine & Rehabilitation

## 2021-05-27 ENCOUNTER — Encounter: Payer: Self-pay | Admitting: Physical Medicine & Rehabilitation

## 2021-05-27 ENCOUNTER — Other Ambulatory Visit: Payer: Self-pay

## 2021-05-27 VITALS — BP 150/80 | HR 60 | Temp 98.1°F | Ht 65.0 in | Wt 121.0 lb

## 2021-05-27 DIAGNOSIS — G8111 Spastic hemiplegia affecting right dominant side: Secondary | ICD-10-CM | POA: Diagnosis not present

## 2021-05-27 DIAGNOSIS — G8110 Spastic hemiplegia affecting unspecified side: Secondary | ICD-10-CM | POA: Insufficient documentation

## 2021-05-27 NOTE — Progress Notes (Signed)
Botox Injection for spasticity of upper extremity using needle EMG guidance Indication: Spastic hemiplegia, dominant side (HCC) G81.10  Dilution: 100 Units/ml        Total Units Injected: 500 Indication: Severe spasticity which interferes with ADL,mobility and/or  hygiene and is unresponsive to medication management and other conservative care Informed consent was obtained after describing risks and benefits of the procedure with the patient. This includes bleeding, bruising, infection, excessive weakness, or medication side effects. A REMS form is on file and signed.  Needle: 21mm injectable monopolar needle electrode    Number of units per muscle Pectoralis Major 50 units Pectoralis Minor 100 units Teres major 50 units Biceps 150 units Brachioradialis 50 units FCR 0 units FCU 0 units FDS 0 units FDP 0 units FPL 0 units Palmaris Longus 0 units Pronator Teres 100 units Pronator Quadratus 0 units Lumbricals 0 units All injections were done after obtaining appropriate EMG activity and after negative drawback for blood. The patient tolerated the procedure well. Post procedure instructions were given. Return in about 3 months (around 08/24/2021) for botox 500 u right upper ext.

## 2021-05-27 NOTE — Patient Instructions (Signed)
PLEASE FEEL FREE TO CALL OUR OFFICE WITH ANY PROBLEMS OR QUESTIONS ([phone])      

## 2021-05-29 DIAGNOSIS — I6389 Other cerebral infarction: Secondary | ICD-10-CM | POA: Diagnosis not present

## 2021-05-29 DIAGNOSIS — I69328 Other speech and language deficits following cerebral infarction: Secondary | ICD-10-CM | POA: Diagnosis not present

## 2021-05-29 DIAGNOSIS — G8111 Spastic hemiplegia affecting right dominant side: Secondary | ICD-10-CM | POA: Diagnosis not present

## 2021-06-02 DIAGNOSIS — G8111 Spastic hemiplegia affecting right dominant side: Secondary | ICD-10-CM | POA: Diagnosis not present

## 2021-06-02 DIAGNOSIS — I6389 Other cerebral infarction: Secondary | ICD-10-CM | POA: Diagnosis not present

## 2021-06-02 DIAGNOSIS — I69328 Other speech and language deficits following cerebral infarction: Secondary | ICD-10-CM | POA: Diagnosis not present

## 2021-06-04 DIAGNOSIS — I6389 Other cerebral infarction: Secondary | ICD-10-CM | POA: Diagnosis not present

## 2021-06-04 DIAGNOSIS — G8111 Spastic hemiplegia affecting right dominant side: Secondary | ICD-10-CM | POA: Diagnosis not present

## 2021-06-04 DIAGNOSIS — I69328 Other speech and language deficits following cerebral infarction: Secondary | ICD-10-CM | POA: Diagnosis not present

## 2021-06-09 DIAGNOSIS — I69328 Other speech and language deficits following cerebral infarction: Secondary | ICD-10-CM | POA: Diagnosis not present

## 2021-06-09 DIAGNOSIS — I6389 Other cerebral infarction: Secondary | ICD-10-CM | POA: Diagnosis not present

## 2021-06-09 DIAGNOSIS — G8111 Spastic hemiplegia affecting right dominant side: Secondary | ICD-10-CM | POA: Diagnosis not present

## 2021-06-12 DIAGNOSIS — I6389 Other cerebral infarction: Secondary | ICD-10-CM | POA: Diagnosis not present

## 2021-06-12 DIAGNOSIS — G8111 Spastic hemiplegia affecting right dominant side: Secondary | ICD-10-CM | POA: Diagnosis not present

## 2021-06-12 DIAGNOSIS — I69328 Other speech and language deficits following cerebral infarction: Secondary | ICD-10-CM | POA: Diagnosis not present

## 2021-06-16 DIAGNOSIS — G8111 Spastic hemiplegia affecting right dominant side: Secondary | ICD-10-CM | POA: Diagnosis not present

## 2021-06-16 DIAGNOSIS — I6389 Other cerebral infarction: Secondary | ICD-10-CM | POA: Diagnosis not present

## 2021-06-16 DIAGNOSIS — I69328 Other speech and language deficits following cerebral infarction: Secondary | ICD-10-CM | POA: Diagnosis not present

## 2021-06-19 DIAGNOSIS — I6389 Other cerebral infarction: Secondary | ICD-10-CM | POA: Diagnosis not present

## 2021-06-19 DIAGNOSIS — I69328 Other speech and language deficits following cerebral infarction: Secondary | ICD-10-CM | POA: Diagnosis not present

## 2021-06-19 DIAGNOSIS — G8111 Spastic hemiplegia affecting right dominant side: Secondary | ICD-10-CM | POA: Diagnosis not present

## 2021-06-23 DIAGNOSIS — G8111 Spastic hemiplegia affecting right dominant side: Secondary | ICD-10-CM | POA: Diagnosis not present

## 2021-06-23 DIAGNOSIS — I69328 Other speech and language deficits following cerebral infarction: Secondary | ICD-10-CM | POA: Diagnosis not present

## 2021-06-23 DIAGNOSIS — I6389 Other cerebral infarction: Secondary | ICD-10-CM | POA: Diagnosis not present

## 2021-06-25 DIAGNOSIS — I6389 Other cerebral infarction: Secondary | ICD-10-CM | POA: Diagnosis not present

## 2021-06-25 DIAGNOSIS — I69328 Other speech and language deficits following cerebral infarction: Secondary | ICD-10-CM | POA: Diagnosis not present

## 2021-06-25 DIAGNOSIS — G8111 Spastic hemiplegia affecting right dominant side: Secondary | ICD-10-CM | POA: Diagnosis not present

## 2021-07-02 DIAGNOSIS — I6389 Other cerebral infarction: Secondary | ICD-10-CM | POA: Diagnosis not present

## 2021-07-02 DIAGNOSIS — I69328 Other speech and language deficits following cerebral infarction: Secondary | ICD-10-CM | POA: Diagnosis not present

## 2021-07-02 DIAGNOSIS — G8111 Spastic hemiplegia affecting right dominant side: Secondary | ICD-10-CM | POA: Diagnosis not present

## 2021-07-07 DIAGNOSIS — I6389 Other cerebral infarction: Secondary | ICD-10-CM | POA: Diagnosis not present

## 2021-07-07 DIAGNOSIS — G8111 Spastic hemiplegia affecting right dominant side: Secondary | ICD-10-CM | POA: Diagnosis not present

## 2021-07-07 DIAGNOSIS — I69328 Other speech and language deficits following cerebral infarction: Secondary | ICD-10-CM | POA: Diagnosis not present

## 2021-07-09 DIAGNOSIS — H2512 Age-related nuclear cataract, left eye: Secondary | ICD-10-CM | POA: Diagnosis not present

## 2021-07-09 DIAGNOSIS — H25812 Combined forms of age-related cataract, left eye: Secondary | ICD-10-CM | POA: Diagnosis not present

## 2021-07-10 DIAGNOSIS — I6389 Other cerebral infarction: Secondary | ICD-10-CM | POA: Diagnosis not present

## 2021-07-10 DIAGNOSIS — I69328 Other speech and language deficits following cerebral infarction: Secondary | ICD-10-CM | POA: Diagnosis not present

## 2021-07-10 DIAGNOSIS — G8111 Spastic hemiplegia affecting right dominant side: Secondary | ICD-10-CM | POA: Diagnosis not present

## 2021-07-14 DIAGNOSIS — G8111 Spastic hemiplegia affecting right dominant side: Secondary | ICD-10-CM | POA: Diagnosis not present

## 2021-07-14 DIAGNOSIS — I6389 Other cerebral infarction: Secondary | ICD-10-CM | POA: Diagnosis not present

## 2021-07-14 DIAGNOSIS — I69328 Other speech and language deficits following cerebral infarction: Secondary | ICD-10-CM | POA: Diagnosis not present

## 2021-07-16 DIAGNOSIS — I6389 Other cerebral infarction: Secondary | ICD-10-CM | POA: Diagnosis not present

## 2021-07-16 DIAGNOSIS — I69328 Other speech and language deficits following cerebral infarction: Secondary | ICD-10-CM | POA: Diagnosis not present

## 2021-07-16 DIAGNOSIS — G8111 Spastic hemiplegia affecting right dominant side: Secondary | ICD-10-CM | POA: Diagnosis not present

## 2021-07-21 DIAGNOSIS — I6389 Other cerebral infarction: Secondary | ICD-10-CM | POA: Diagnosis not present

## 2021-07-21 DIAGNOSIS — I69328 Other speech and language deficits following cerebral infarction: Secondary | ICD-10-CM | POA: Diagnosis not present

## 2021-07-21 DIAGNOSIS — G8111 Spastic hemiplegia affecting right dominant side: Secondary | ICD-10-CM | POA: Diagnosis not present

## 2021-07-22 DIAGNOSIS — I6389 Other cerebral infarction: Secondary | ICD-10-CM | POA: Diagnosis not present

## 2021-07-22 DIAGNOSIS — I69328 Other speech and language deficits following cerebral infarction: Secondary | ICD-10-CM | POA: Diagnosis not present

## 2021-07-22 DIAGNOSIS — G8111 Spastic hemiplegia affecting right dominant side: Secondary | ICD-10-CM | POA: Diagnosis not present

## 2021-08-05 DIAGNOSIS — I69328 Other speech and language deficits following cerebral infarction: Secondary | ICD-10-CM | POA: Diagnosis not present

## 2021-08-05 DIAGNOSIS — I6389 Other cerebral infarction: Secondary | ICD-10-CM | POA: Diagnosis not present

## 2021-08-05 DIAGNOSIS — G8111 Spastic hemiplegia affecting right dominant side: Secondary | ICD-10-CM | POA: Diagnosis not present

## 2021-08-06 DIAGNOSIS — I6389 Other cerebral infarction: Secondary | ICD-10-CM | POA: Diagnosis not present

## 2021-08-06 DIAGNOSIS — G8111 Spastic hemiplegia affecting right dominant side: Secondary | ICD-10-CM | POA: Diagnosis not present

## 2021-08-06 DIAGNOSIS — I69328 Other speech and language deficits following cerebral infarction: Secondary | ICD-10-CM | POA: Diagnosis not present

## 2021-08-11 DIAGNOSIS — I6389 Other cerebral infarction: Secondary | ICD-10-CM | POA: Diagnosis not present

## 2021-08-11 DIAGNOSIS — I69328 Other speech and language deficits following cerebral infarction: Secondary | ICD-10-CM | POA: Diagnosis not present

## 2021-08-11 DIAGNOSIS — G8111 Spastic hemiplegia affecting right dominant side: Secondary | ICD-10-CM | POA: Diagnosis not present

## 2021-08-13 DIAGNOSIS — G8111 Spastic hemiplegia affecting right dominant side: Secondary | ICD-10-CM | POA: Diagnosis not present

## 2021-08-13 DIAGNOSIS — I69328 Other speech and language deficits following cerebral infarction: Secondary | ICD-10-CM | POA: Diagnosis not present

## 2021-08-13 DIAGNOSIS — I6389 Other cerebral infarction: Secondary | ICD-10-CM | POA: Diagnosis not present

## 2021-08-17 DIAGNOSIS — I69328 Other speech and language deficits following cerebral infarction: Secondary | ICD-10-CM | POA: Diagnosis not present

## 2021-08-17 DIAGNOSIS — I6389 Other cerebral infarction: Secondary | ICD-10-CM | POA: Diagnosis not present

## 2021-08-17 DIAGNOSIS — G8111 Spastic hemiplegia affecting right dominant side: Secondary | ICD-10-CM | POA: Diagnosis not present

## 2021-08-21 DIAGNOSIS — G8111 Spastic hemiplegia affecting right dominant side: Secondary | ICD-10-CM | POA: Diagnosis not present

## 2021-08-21 DIAGNOSIS — I69328 Other speech and language deficits following cerebral infarction: Secondary | ICD-10-CM | POA: Diagnosis not present

## 2021-08-21 DIAGNOSIS — I6389 Other cerebral infarction: Secondary | ICD-10-CM | POA: Diagnosis not present

## 2021-08-24 DIAGNOSIS — I69328 Other speech and language deficits following cerebral infarction: Secondary | ICD-10-CM | POA: Diagnosis not present

## 2021-08-24 DIAGNOSIS — G8111 Spastic hemiplegia affecting right dominant side: Secondary | ICD-10-CM | POA: Diagnosis not present

## 2021-08-24 DIAGNOSIS — I6389 Other cerebral infarction: Secondary | ICD-10-CM | POA: Diagnosis not present

## 2021-08-26 DIAGNOSIS — I69359 Hemiplegia and hemiparesis following cerebral infarction affecting unspecified side: Secondary | ICD-10-CM | POA: Diagnosis not present

## 2021-08-26 DIAGNOSIS — Z23 Encounter for immunization: Secondary | ICD-10-CM | POA: Diagnosis not present

## 2021-08-26 DIAGNOSIS — G8110 Spastic hemiplegia affecting unspecified side: Secondary | ICD-10-CM | POA: Diagnosis not present

## 2021-08-26 DIAGNOSIS — I69320 Aphasia following cerebral infarction: Secondary | ICD-10-CM | POA: Diagnosis not present

## 2021-08-27 DIAGNOSIS — I69328 Other speech and language deficits following cerebral infarction: Secondary | ICD-10-CM | POA: Diagnosis not present

## 2021-08-27 DIAGNOSIS — G8111 Spastic hemiplegia affecting right dominant side: Secondary | ICD-10-CM | POA: Diagnosis not present

## 2021-08-27 DIAGNOSIS — I6389 Other cerebral infarction: Secondary | ICD-10-CM | POA: Diagnosis not present

## 2021-08-31 DIAGNOSIS — I69328 Other speech and language deficits following cerebral infarction: Secondary | ICD-10-CM | POA: Diagnosis not present

## 2021-08-31 DIAGNOSIS — I6389 Other cerebral infarction: Secondary | ICD-10-CM | POA: Diagnosis not present

## 2021-08-31 DIAGNOSIS — G8111 Spastic hemiplegia affecting right dominant side: Secondary | ICD-10-CM | POA: Diagnosis not present

## 2021-09-04 DIAGNOSIS — I6389 Other cerebral infarction: Secondary | ICD-10-CM | POA: Diagnosis not present

## 2021-09-04 DIAGNOSIS — I69328 Other speech and language deficits following cerebral infarction: Secondary | ICD-10-CM | POA: Diagnosis not present

## 2021-09-04 DIAGNOSIS — G8111 Spastic hemiplegia affecting right dominant side: Secondary | ICD-10-CM | POA: Diagnosis not present

## 2021-09-08 DIAGNOSIS — I69328 Other speech and language deficits following cerebral infarction: Secondary | ICD-10-CM | POA: Diagnosis not present

## 2021-09-08 DIAGNOSIS — G8111 Spastic hemiplegia affecting right dominant side: Secondary | ICD-10-CM | POA: Diagnosis not present

## 2021-09-08 DIAGNOSIS — I6389 Other cerebral infarction: Secondary | ICD-10-CM | POA: Diagnosis not present

## 2021-09-09 ENCOUNTER — Encounter: Payer: Self-pay | Admitting: Physical Medicine & Rehabilitation

## 2021-09-09 ENCOUNTER — Encounter: Payer: Medicare Other | Attending: Physical Medicine & Rehabilitation | Admitting: Physical Medicine & Rehabilitation

## 2021-09-09 ENCOUNTER — Ambulatory Visit: Payer: Medicare Other | Admitting: Physical Medicine & Rehabilitation

## 2021-09-09 VITALS — BP 130/75 | HR 64 | Ht 65.0 in

## 2021-09-09 DIAGNOSIS — G8110 Spastic hemiplegia affecting unspecified side: Secondary | ICD-10-CM | POA: Diagnosis not present

## 2021-09-09 DIAGNOSIS — G8111 Spastic hemiplegia affecting right dominant side: Secondary | ICD-10-CM

## 2021-09-09 NOTE — Patient Instructions (Signed)
PLEASE FEEL FREE TO CALL OUR OFFICE WITH ANY PROBLEMS OR QUESTIONS ([phone])      

## 2021-09-09 NOTE — Progress Notes (Signed)
Botox Injection for spasticity of upper extremity using needle EMG guidance Indication: Spastic hemiplegia, dominant side (HCC)   Dilution: 100 Units/ml        Total Units Injected: 500 Indication: Severe spasticity which interferes with ADL,mobility and/or  hygiene and is unresponsive to medication management and other conservative care Informed consent was obtained after describing risks and benefits of the procedure with the patient. This includes bleeding, bruising, infection, excessive weakness, or medication side effects. A REMS form is on file and signed.  Needle: 84m injectable monopolar needle electrode    Number of units per muscle Pectoralis Major 25 units Pectoralis Minor 75 units Teres major/minor 50 units Latissimus dorsi 50 units Biceps 100 units Brachioradialis 75 units FCR 10 units FCU 10 units FDS 40 units FDP 40 units FPL 0 units Palmaris Longus 0 units Pronator Teres 25 units Pronator Quadratus 0 units Lumbricals 0 units All injections were done after obtaining appropriate EMG activity and after negative drawback for blood. The patient tolerated the procedure well. Post procedure instructions were given. Return in about 3 months (around 12/10/2021) for botox 300u rue.

## 2021-09-11 DIAGNOSIS — I69328 Other speech and language deficits following cerebral infarction: Secondary | ICD-10-CM | POA: Diagnosis not present

## 2021-09-11 DIAGNOSIS — G8111 Spastic hemiplegia affecting right dominant side: Secondary | ICD-10-CM | POA: Diagnosis not present

## 2021-09-11 DIAGNOSIS — I6389 Other cerebral infarction: Secondary | ICD-10-CM | POA: Diagnosis not present

## 2021-09-15 DIAGNOSIS — I6389 Other cerebral infarction: Secondary | ICD-10-CM | POA: Diagnosis not present

## 2021-09-15 DIAGNOSIS — I69328 Other speech and language deficits following cerebral infarction: Secondary | ICD-10-CM | POA: Diagnosis not present

## 2021-09-15 DIAGNOSIS — G8111 Spastic hemiplegia affecting right dominant side: Secondary | ICD-10-CM | POA: Diagnosis not present

## 2021-09-17 DIAGNOSIS — I6389 Other cerebral infarction: Secondary | ICD-10-CM | POA: Diagnosis not present

## 2021-09-17 DIAGNOSIS — G8111 Spastic hemiplegia affecting right dominant side: Secondary | ICD-10-CM | POA: Diagnosis not present

## 2021-09-17 DIAGNOSIS — I69328 Other speech and language deficits following cerebral infarction: Secondary | ICD-10-CM | POA: Diagnosis not present

## 2021-09-18 DIAGNOSIS — M8589 Other specified disorders of bone density and structure, multiple sites: Secondary | ICD-10-CM | POA: Diagnosis not present

## 2021-09-22 DIAGNOSIS — I6389 Other cerebral infarction: Secondary | ICD-10-CM | POA: Diagnosis not present

## 2021-09-22 DIAGNOSIS — I69328 Other speech and language deficits following cerebral infarction: Secondary | ICD-10-CM | POA: Diagnosis not present

## 2021-09-22 DIAGNOSIS — G8111 Spastic hemiplegia affecting right dominant side: Secondary | ICD-10-CM | POA: Diagnosis not present

## 2021-09-24 DIAGNOSIS — G8111 Spastic hemiplegia affecting right dominant side: Secondary | ICD-10-CM | POA: Diagnosis not present

## 2021-09-24 DIAGNOSIS — I69328 Other speech and language deficits following cerebral infarction: Secondary | ICD-10-CM | POA: Diagnosis not present

## 2021-09-24 DIAGNOSIS — I6389 Other cerebral infarction: Secondary | ICD-10-CM | POA: Diagnosis not present

## 2021-09-29 DIAGNOSIS — I69328 Other speech and language deficits following cerebral infarction: Secondary | ICD-10-CM | POA: Diagnosis not present

## 2021-09-29 DIAGNOSIS — G8111 Spastic hemiplegia affecting right dominant side: Secondary | ICD-10-CM | POA: Diagnosis not present

## 2021-09-29 DIAGNOSIS — I6389 Other cerebral infarction: Secondary | ICD-10-CM | POA: Diagnosis not present

## 2021-10-01 DIAGNOSIS — I6389 Other cerebral infarction: Secondary | ICD-10-CM | POA: Diagnosis not present

## 2021-10-01 DIAGNOSIS — I69328 Other speech and language deficits following cerebral infarction: Secondary | ICD-10-CM | POA: Diagnosis not present

## 2021-10-01 DIAGNOSIS — G8111 Spastic hemiplegia affecting right dominant side: Secondary | ICD-10-CM | POA: Diagnosis not present

## 2021-10-05 DIAGNOSIS — I69328 Other speech and language deficits following cerebral infarction: Secondary | ICD-10-CM | POA: Diagnosis not present

## 2021-10-05 DIAGNOSIS — I6389 Other cerebral infarction: Secondary | ICD-10-CM | POA: Diagnosis not present

## 2021-10-05 DIAGNOSIS — G8111 Spastic hemiplegia affecting right dominant side: Secondary | ICD-10-CM | POA: Diagnosis not present

## 2021-10-06 DIAGNOSIS — G8111 Spastic hemiplegia affecting right dominant side: Secondary | ICD-10-CM | POA: Diagnosis not present

## 2021-10-06 DIAGNOSIS — I6389 Other cerebral infarction: Secondary | ICD-10-CM | POA: Diagnosis not present

## 2021-10-06 DIAGNOSIS — I69328 Other speech and language deficits following cerebral infarction: Secondary | ICD-10-CM | POA: Diagnosis not present

## 2021-10-12 DIAGNOSIS — M6389 Disorders of muscle in diseases classified elsewhere, multiple sites: Secondary | ICD-10-CM | POA: Diagnosis not present

## 2021-10-12 DIAGNOSIS — R293 Abnormal posture: Secondary | ICD-10-CM | POA: Diagnosis not present

## 2021-10-12 DIAGNOSIS — Z8673 Personal history of transient ischemic attack (TIA), and cerebral infarction without residual deficits: Secondary | ICD-10-CM | POA: Diagnosis not present

## 2021-10-12 DIAGNOSIS — G8111 Spastic hemiplegia affecting right dominant side: Secondary | ICD-10-CM | POA: Diagnosis not present

## 2021-10-13 DIAGNOSIS — R41841 Cognitive communication deficit: Secondary | ICD-10-CM | POA: Diagnosis not present

## 2021-10-13 DIAGNOSIS — I69328 Other speech and language deficits following cerebral infarction: Secondary | ICD-10-CM | POA: Diagnosis not present

## 2021-10-13 DIAGNOSIS — G8111 Spastic hemiplegia affecting right dominant side: Secondary | ICD-10-CM | POA: Diagnosis not present

## 2021-10-13 DIAGNOSIS — I6389 Other cerebral infarction: Secondary | ICD-10-CM | POA: Diagnosis not present

## 2021-10-15 DIAGNOSIS — G8111 Spastic hemiplegia affecting right dominant side: Secondary | ICD-10-CM | POA: Diagnosis not present

## 2021-10-15 DIAGNOSIS — R41841 Cognitive communication deficit: Secondary | ICD-10-CM | POA: Diagnosis not present

## 2021-10-15 DIAGNOSIS — I6389 Other cerebral infarction: Secondary | ICD-10-CM | POA: Diagnosis not present

## 2021-10-15 DIAGNOSIS — I69328 Other speech and language deficits following cerebral infarction: Secondary | ICD-10-CM | POA: Diagnosis not present

## 2021-10-22 DIAGNOSIS — I6389 Other cerebral infarction: Secondary | ICD-10-CM | POA: Diagnosis not present

## 2021-10-22 DIAGNOSIS — G8111 Spastic hemiplegia affecting right dominant side: Secondary | ICD-10-CM | POA: Diagnosis not present

## 2021-10-22 DIAGNOSIS — Z8673 Personal history of transient ischemic attack (TIA), and cerebral infarction without residual deficits: Secondary | ICD-10-CM | POA: Diagnosis not present

## 2021-10-22 DIAGNOSIS — R41841 Cognitive communication deficit: Secondary | ICD-10-CM | POA: Diagnosis not present

## 2021-10-22 DIAGNOSIS — R293 Abnormal posture: Secondary | ICD-10-CM | POA: Diagnosis not present

## 2021-10-22 DIAGNOSIS — M6389 Disorders of muscle in diseases classified elsewhere, multiple sites: Secondary | ICD-10-CM | POA: Diagnosis not present

## 2021-10-22 DIAGNOSIS — I69328 Other speech and language deficits following cerebral infarction: Secondary | ICD-10-CM | POA: Diagnosis not present

## 2021-10-23 DIAGNOSIS — G8111 Spastic hemiplegia affecting right dominant side: Secondary | ICD-10-CM | POA: Diagnosis not present

## 2021-10-23 DIAGNOSIS — R41841 Cognitive communication deficit: Secondary | ICD-10-CM | POA: Diagnosis not present

## 2021-10-23 DIAGNOSIS — I6389 Other cerebral infarction: Secondary | ICD-10-CM | POA: Diagnosis not present

## 2021-10-23 DIAGNOSIS — I69328 Other speech and language deficits following cerebral infarction: Secondary | ICD-10-CM | POA: Diagnosis not present

## 2021-10-26 DIAGNOSIS — G8111 Spastic hemiplegia affecting right dominant side: Secondary | ICD-10-CM | POA: Diagnosis not present

## 2021-10-26 DIAGNOSIS — Z8673 Personal history of transient ischemic attack (TIA), and cerebral infarction without residual deficits: Secondary | ICD-10-CM | POA: Diagnosis not present

## 2021-10-26 DIAGNOSIS — M6389 Disorders of muscle in diseases classified elsewhere, multiple sites: Secondary | ICD-10-CM | POA: Diagnosis not present

## 2021-10-26 DIAGNOSIS — R293 Abnormal posture: Secondary | ICD-10-CM | POA: Diagnosis not present

## 2021-10-27 DIAGNOSIS — G8111 Spastic hemiplegia affecting right dominant side: Secondary | ICD-10-CM | POA: Diagnosis not present

## 2021-10-27 DIAGNOSIS — I6389 Other cerebral infarction: Secondary | ICD-10-CM | POA: Diagnosis not present

## 2021-10-27 DIAGNOSIS — R41841 Cognitive communication deficit: Secondary | ICD-10-CM | POA: Diagnosis not present

## 2021-10-27 DIAGNOSIS — I69328 Other speech and language deficits following cerebral infarction: Secondary | ICD-10-CM | POA: Diagnosis not present

## 2021-10-29 DIAGNOSIS — R41841 Cognitive communication deficit: Secondary | ICD-10-CM | POA: Diagnosis not present

## 2021-10-29 DIAGNOSIS — G8111 Spastic hemiplegia affecting right dominant side: Secondary | ICD-10-CM | POA: Diagnosis not present

## 2021-10-29 DIAGNOSIS — I69328 Other speech and language deficits following cerebral infarction: Secondary | ICD-10-CM | POA: Diagnosis not present

## 2021-10-29 DIAGNOSIS — I6389 Other cerebral infarction: Secondary | ICD-10-CM | POA: Diagnosis not present

## 2021-11-02 DIAGNOSIS — G8111 Spastic hemiplegia affecting right dominant side: Secondary | ICD-10-CM | POA: Diagnosis not present

## 2021-11-02 DIAGNOSIS — Z8673 Personal history of transient ischemic attack (TIA), and cerebral infarction without residual deficits: Secondary | ICD-10-CM | POA: Diagnosis not present

## 2021-11-02 DIAGNOSIS — R293 Abnormal posture: Secondary | ICD-10-CM | POA: Diagnosis not present

## 2021-11-02 DIAGNOSIS — M6389 Disorders of muscle in diseases classified elsewhere, multiple sites: Secondary | ICD-10-CM | POA: Diagnosis not present

## 2021-11-04 DIAGNOSIS — G8111 Spastic hemiplegia affecting right dominant side: Secondary | ICD-10-CM | POA: Diagnosis not present

## 2021-11-04 DIAGNOSIS — R41841 Cognitive communication deficit: Secondary | ICD-10-CM | POA: Diagnosis not present

## 2021-11-04 DIAGNOSIS — I69328 Other speech and language deficits following cerebral infarction: Secondary | ICD-10-CM | POA: Diagnosis not present

## 2021-11-04 DIAGNOSIS — I6389 Other cerebral infarction: Secondary | ICD-10-CM | POA: Diagnosis not present

## 2021-11-05 DIAGNOSIS — R41841 Cognitive communication deficit: Secondary | ICD-10-CM | POA: Diagnosis not present

## 2021-11-05 DIAGNOSIS — I69328 Other speech and language deficits following cerebral infarction: Secondary | ICD-10-CM | POA: Diagnosis not present

## 2021-11-05 DIAGNOSIS — G8111 Spastic hemiplegia affecting right dominant side: Secondary | ICD-10-CM | POA: Diagnosis not present

## 2021-11-05 DIAGNOSIS — I6389 Other cerebral infarction: Secondary | ICD-10-CM | POA: Diagnosis not present

## 2021-11-09 DIAGNOSIS — M6389 Disorders of muscle in diseases classified elsewhere, multiple sites: Secondary | ICD-10-CM | POA: Diagnosis not present

## 2021-11-09 DIAGNOSIS — R293 Abnormal posture: Secondary | ICD-10-CM | POA: Diagnosis not present

## 2021-11-09 DIAGNOSIS — G8111 Spastic hemiplegia affecting right dominant side: Secondary | ICD-10-CM | POA: Diagnosis not present

## 2021-11-09 DIAGNOSIS — Z8673 Personal history of transient ischemic attack (TIA), and cerebral infarction without residual deficits: Secondary | ICD-10-CM | POA: Diagnosis not present

## 2021-11-10 DIAGNOSIS — R41841 Cognitive communication deficit: Secondary | ICD-10-CM | POA: Diagnosis not present

## 2021-11-10 DIAGNOSIS — I69328 Other speech and language deficits following cerebral infarction: Secondary | ICD-10-CM | POA: Diagnosis not present

## 2021-11-10 DIAGNOSIS — G8111 Spastic hemiplegia affecting right dominant side: Secondary | ICD-10-CM | POA: Diagnosis not present

## 2021-11-10 DIAGNOSIS — I6389 Other cerebral infarction: Secondary | ICD-10-CM | POA: Diagnosis not present

## 2021-11-12 DIAGNOSIS — G8111 Spastic hemiplegia affecting right dominant side: Secondary | ICD-10-CM | POA: Diagnosis not present

## 2021-11-12 DIAGNOSIS — I6389 Other cerebral infarction: Secondary | ICD-10-CM | POA: Diagnosis not present

## 2021-11-12 DIAGNOSIS — I69328 Other speech and language deficits following cerebral infarction: Secondary | ICD-10-CM | POA: Diagnosis not present

## 2021-11-12 DIAGNOSIS — R41841 Cognitive communication deficit: Secondary | ICD-10-CM | POA: Diagnosis not present

## 2021-11-18 DIAGNOSIS — I6389 Other cerebral infarction: Secondary | ICD-10-CM | POA: Diagnosis not present

## 2021-11-18 DIAGNOSIS — R41841 Cognitive communication deficit: Secondary | ICD-10-CM | POA: Diagnosis not present

## 2021-11-18 DIAGNOSIS — G8111 Spastic hemiplegia affecting right dominant side: Secondary | ICD-10-CM | POA: Diagnosis not present

## 2021-11-18 DIAGNOSIS — I69328 Other speech and language deficits following cerebral infarction: Secondary | ICD-10-CM | POA: Diagnosis not present

## 2021-11-20 DIAGNOSIS — M6389 Disorders of muscle in diseases classified elsewhere, multiple sites: Secondary | ICD-10-CM | POA: Diagnosis not present

## 2021-11-20 DIAGNOSIS — I69328 Other speech and language deficits following cerebral infarction: Secondary | ICD-10-CM | POA: Diagnosis not present

## 2021-11-20 DIAGNOSIS — I6389 Other cerebral infarction: Secondary | ICD-10-CM | POA: Diagnosis not present

## 2021-11-20 DIAGNOSIS — G8111 Spastic hemiplegia affecting right dominant side: Secondary | ICD-10-CM | POA: Diagnosis not present

## 2021-11-20 DIAGNOSIS — R41841 Cognitive communication deficit: Secondary | ICD-10-CM | POA: Diagnosis not present

## 2021-11-20 DIAGNOSIS — Z8673 Personal history of transient ischemic attack (TIA), and cerebral infarction without residual deficits: Secondary | ICD-10-CM | POA: Diagnosis not present

## 2021-11-20 DIAGNOSIS — R293 Abnormal posture: Secondary | ICD-10-CM | POA: Diagnosis not present

## 2021-11-23 DIAGNOSIS — G8111 Spastic hemiplegia affecting right dominant side: Secondary | ICD-10-CM | POA: Diagnosis not present

## 2021-11-23 DIAGNOSIS — Z8673 Personal history of transient ischemic attack (TIA), and cerebral infarction without residual deficits: Secondary | ICD-10-CM | POA: Diagnosis not present

## 2021-11-23 DIAGNOSIS — M6389 Disorders of muscle in diseases classified elsewhere, multiple sites: Secondary | ICD-10-CM | POA: Diagnosis not present

## 2021-11-23 DIAGNOSIS — R293 Abnormal posture: Secondary | ICD-10-CM | POA: Diagnosis not present

## 2021-11-25 DIAGNOSIS — R41841 Cognitive communication deficit: Secondary | ICD-10-CM | POA: Diagnosis not present

## 2021-11-25 DIAGNOSIS — I6389 Other cerebral infarction: Secondary | ICD-10-CM | POA: Diagnosis not present

## 2021-11-25 DIAGNOSIS — G8111 Spastic hemiplegia affecting right dominant side: Secondary | ICD-10-CM | POA: Diagnosis not present

## 2021-11-25 DIAGNOSIS — I69328 Other speech and language deficits following cerebral infarction: Secondary | ICD-10-CM | POA: Diagnosis not present

## 2021-11-26 DIAGNOSIS — G8111 Spastic hemiplegia affecting right dominant side: Secondary | ICD-10-CM | POA: Diagnosis not present

## 2021-11-26 DIAGNOSIS — Z8673 Personal history of transient ischemic attack (TIA), and cerebral infarction without residual deficits: Secondary | ICD-10-CM | POA: Diagnosis not present

## 2021-11-26 DIAGNOSIS — R293 Abnormal posture: Secondary | ICD-10-CM | POA: Diagnosis not present

## 2021-11-26 DIAGNOSIS — M6389 Disorders of muscle in diseases classified elsewhere, multiple sites: Secondary | ICD-10-CM | POA: Diagnosis not present

## 2021-11-27 DIAGNOSIS — G8111 Spastic hemiplegia affecting right dominant side: Secondary | ICD-10-CM | POA: Diagnosis not present

## 2021-11-27 DIAGNOSIS — I69328 Other speech and language deficits following cerebral infarction: Secondary | ICD-10-CM | POA: Diagnosis not present

## 2021-11-27 DIAGNOSIS — R41841 Cognitive communication deficit: Secondary | ICD-10-CM | POA: Diagnosis not present

## 2021-11-27 DIAGNOSIS — I6389 Other cerebral infarction: Secondary | ICD-10-CM | POA: Diagnosis not present

## 2021-12-01 DIAGNOSIS — G8111 Spastic hemiplegia affecting right dominant side: Secondary | ICD-10-CM | POA: Diagnosis not present

## 2021-12-01 DIAGNOSIS — I69328 Other speech and language deficits following cerebral infarction: Secondary | ICD-10-CM | POA: Diagnosis not present

## 2021-12-01 DIAGNOSIS — I6389 Other cerebral infarction: Secondary | ICD-10-CM | POA: Diagnosis not present

## 2021-12-01 DIAGNOSIS — R41841 Cognitive communication deficit: Secondary | ICD-10-CM | POA: Diagnosis not present

## 2021-12-03 DIAGNOSIS — I6389 Other cerebral infarction: Secondary | ICD-10-CM | POA: Diagnosis not present

## 2021-12-03 DIAGNOSIS — G8111 Spastic hemiplegia affecting right dominant side: Secondary | ICD-10-CM | POA: Diagnosis not present

## 2021-12-03 DIAGNOSIS — I69328 Other speech and language deficits following cerebral infarction: Secondary | ICD-10-CM | POA: Diagnosis not present

## 2021-12-03 DIAGNOSIS — R41841 Cognitive communication deficit: Secondary | ICD-10-CM | POA: Diagnosis not present

## 2021-12-08 DIAGNOSIS — I6389 Other cerebral infarction: Secondary | ICD-10-CM | POA: Diagnosis not present

## 2021-12-08 DIAGNOSIS — R41841 Cognitive communication deficit: Secondary | ICD-10-CM | POA: Diagnosis not present

## 2021-12-08 DIAGNOSIS — I69328 Other speech and language deficits following cerebral infarction: Secondary | ICD-10-CM | POA: Diagnosis not present

## 2021-12-08 DIAGNOSIS — G8111 Spastic hemiplegia affecting right dominant side: Secondary | ICD-10-CM | POA: Diagnosis not present

## 2021-12-10 DIAGNOSIS — I6389 Other cerebral infarction: Secondary | ICD-10-CM | POA: Diagnosis not present

## 2021-12-10 DIAGNOSIS — I69328 Other speech and language deficits following cerebral infarction: Secondary | ICD-10-CM | POA: Diagnosis not present

## 2021-12-10 DIAGNOSIS — G8111 Spastic hemiplegia affecting right dominant side: Secondary | ICD-10-CM | POA: Diagnosis not present

## 2021-12-10 DIAGNOSIS — R41841 Cognitive communication deficit: Secondary | ICD-10-CM | POA: Diagnosis not present

## 2021-12-15 DIAGNOSIS — I6389 Other cerebral infarction: Secondary | ICD-10-CM | POA: Diagnosis not present

## 2021-12-15 DIAGNOSIS — R41841 Cognitive communication deficit: Secondary | ICD-10-CM | POA: Diagnosis not present

## 2021-12-15 DIAGNOSIS — I69328 Other speech and language deficits following cerebral infarction: Secondary | ICD-10-CM | POA: Diagnosis not present

## 2021-12-15 DIAGNOSIS — G8111 Spastic hemiplegia affecting right dominant side: Secondary | ICD-10-CM | POA: Diagnosis not present

## 2021-12-16 ENCOUNTER — Encounter: Payer: Self-pay | Admitting: Physical Medicine & Rehabilitation

## 2021-12-16 ENCOUNTER — Encounter: Payer: Medicare Other | Attending: Physical Medicine & Rehabilitation | Admitting: Physical Medicine & Rehabilitation

## 2021-12-16 VITALS — BP 131/71 | HR 72 | Ht 65.0 in | Wt 118.8 lb

## 2021-12-16 DIAGNOSIS — G8110 Spastic hemiplegia affecting unspecified side: Secondary | ICD-10-CM | POA: Diagnosis not present

## 2021-12-16 DIAGNOSIS — I69351 Hemiplegia and hemiparesis following cerebral infarction affecting right dominant side: Secondary | ICD-10-CM | POA: Diagnosis not present

## 2021-12-16 NOTE — Progress Notes (Signed)
Botox Injection for spasticity of upper extremity using needle EMG guidance Indication: Spastic hemiplegia, dominant side (HCC)   Dilution: 100 Units/ml        Total Units Injected: 500 Indication: Severe spasticity which interferes with ADL,mobility and/or  hygiene and is unresponsive to medication management and other conservative care Informed consent was obtained after describing risks and benefits of the procedure with the patient. This includes bleeding, bruising, infection, excessive weakness, or medication side effects. A REMS form is on file and signed.  Needle: 38m injectable monopolar needle electrode    Number of units per muscle Pectoralis Major 50 units Pectoralis Minor 50 units Biceps 125 units Brachioradialis 75 units FCR 10 units FCU 10 units FDS 70 units FDP 70 units FPL 0 units Palmaris Longus 0 units Pronator Teres 40 units Pronator Quadratus 0 units Lumbricals 0 units All injections were done after obtaining appropriate EMG activity and after negative drawback for blood. The patient tolerated the procedure well. Post procedure instructions were given. Return in about 3 months (around 03/18/2022) for botox 500u rue, pecs.    Hip flexor stretches were demonstrated and provided

## 2021-12-16 NOTE — Patient Instructions (Signed)
PLEASE FEEL FREE TO CALL OUR OFFICE WITH ANY PROBLEMS OR QUESTIONS ([phone])      

## 2021-12-18 DIAGNOSIS — R41841 Cognitive communication deficit: Secondary | ICD-10-CM | POA: Diagnosis not present

## 2021-12-18 DIAGNOSIS — I6389 Other cerebral infarction: Secondary | ICD-10-CM | POA: Diagnosis not present

## 2021-12-18 DIAGNOSIS — I69328 Other speech and language deficits following cerebral infarction: Secondary | ICD-10-CM | POA: Diagnosis not present

## 2021-12-18 DIAGNOSIS — G8111 Spastic hemiplegia affecting right dominant side: Secondary | ICD-10-CM | POA: Diagnosis not present

## 2021-12-21 DIAGNOSIS — R41841 Cognitive communication deficit: Secondary | ICD-10-CM | POA: Diagnosis not present

## 2021-12-21 DIAGNOSIS — I6389 Other cerebral infarction: Secondary | ICD-10-CM | POA: Diagnosis not present

## 2021-12-21 DIAGNOSIS — G8111 Spastic hemiplegia affecting right dominant side: Secondary | ICD-10-CM | POA: Diagnosis not present

## 2021-12-21 DIAGNOSIS — I69328 Other speech and language deficits following cerebral infarction: Secondary | ICD-10-CM | POA: Diagnosis not present

## 2021-12-29 DIAGNOSIS — R41841 Cognitive communication deficit: Secondary | ICD-10-CM | POA: Diagnosis not present

## 2021-12-29 DIAGNOSIS — G8111 Spastic hemiplegia affecting right dominant side: Secondary | ICD-10-CM | POA: Diagnosis not present

## 2021-12-29 DIAGNOSIS — I6389 Other cerebral infarction: Secondary | ICD-10-CM | POA: Diagnosis not present

## 2021-12-29 DIAGNOSIS — I69328 Other speech and language deficits following cerebral infarction: Secondary | ICD-10-CM | POA: Diagnosis not present

## 2021-12-31 DIAGNOSIS — G8111 Spastic hemiplegia affecting right dominant side: Secondary | ICD-10-CM | POA: Diagnosis not present

## 2021-12-31 DIAGNOSIS — I69328 Other speech and language deficits following cerebral infarction: Secondary | ICD-10-CM | POA: Diagnosis not present

## 2021-12-31 DIAGNOSIS — R41841 Cognitive communication deficit: Secondary | ICD-10-CM | POA: Diagnosis not present

## 2021-12-31 DIAGNOSIS — I6389 Other cerebral infarction: Secondary | ICD-10-CM | POA: Diagnosis not present

## 2022-01-01 ENCOUNTER — Other Ambulatory Visit (HOSPITAL_COMMUNITY): Payer: Self-pay | Admitting: *Deleted

## 2022-01-04 ENCOUNTER — Ambulatory Visit (HOSPITAL_COMMUNITY)
Admission: RE | Admit: 2022-01-04 | Discharge: 2022-01-04 | Disposition: A | Discharge status: Home / Self Care | Payer: Medicare Other | Source: Ambulatory Visit | Attending: Internal Medicine | Admitting: Internal Medicine

## 2022-01-04 DIAGNOSIS — M810 Age-related osteoporosis without current pathological fracture: Secondary | ICD-10-CM | POA: Insufficient documentation

## 2022-01-04 MED ORDER — DENOSUMAB 60 MG/ML ~~LOC~~ SOSY
PREFILLED_SYRINGE | SUBCUTANEOUS | Status: AC
  Administered 2022-01-04: 60 mg via SUBCUTANEOUS
  Filled 2022-01-04: qty 1

## 2022-01-04 MED ORDER — DENOSUMAB 60 MG/ML ~~LOC~~ SOSY: 60.0000 mg | PREFILLED_SYRINGE | Freq: Once | SUBCUTANEOUS | Status: AC

## 2022-01-05 DIAGNOSIS — R41841 Cognitive communication deficit: Secondary | ICD-10-CM | POA: Diagnosis not present

## 2022-01-05 DIAGNOSIS — G8111 Spastic hemiplegia affecting right dominant side: Secondary | ICD-10-CM | POA: Diagnosis not present

## 2022-01-05 DIAGNOSIS — I69328 Other speech and language deficits following cerebral infarction: Secondary | ICD-10-CM | POA: Diagnosis not present

## 2022-01-05 DIAGNOSIS — I6389 Other cerebral infarction: Secondary | ICD-10-CM | POA: Diagnosis not present

## 2022-01-08 DIAGNOSIS — I6389 Other cerebral infarction: Secondary | ICD-10-CM | POA: Diagnosis not present

## 2022-01-08 DIAGNOSIS — R41841 Cognitive communication deficit: Secondary | ICD-10-CM | POA: Diagnosis not present

## 2022-01-08 DIAGNOSIS — G8111 Spastic hemiplegia affecting right dominant side: Secondary | ICD-10-CM | POA: Diagnosis not present

## 2022-01-08 DIAGNOSIS — I69328 Other speech and language deficits following cerebral infarction: Secondary | ICD-10-CM | POA: Diagnosis not present

## 2022-01-12 DIAGNOSIS — R41841 Cognitive communication deficit: Secondary | ICD-10-CM | POA: Diagnosis not present

## 2022-01-12 DIAGNOSIS — I69328 Other speech and language deficits following cerebral infarction: Secondary | ICD-10-CM | POA: Diagnosis not present

## 2022-01-12 DIAGNOSIS — G8111 Spastic hemiplegia affecting right dominant side: Secondary | ICD-10-CM | POA: Diagnosis not present

## 2022-01-12 DIAGNOSIS — I6389 Other cerebral infarction: Secondary | ICD-10-CM | POA: Diagnosis not present

## 2022-01-12 DIAGNOSIS — Z8673 Personal history of transient ischemic attack (TIA), and cerebral infarction without residual deficits: Secondary | ICD-10-CM | POA: Diagnosis not present

## 2022-01-12 DIAGNOSIS — M6389 Disorders of muscle in diseases classified elsewhere, multiple sites: Secondary | ICD-10-CM | POA: Diagnosis not present

## 2022-01-12 DIAGNOSIS — R293 Abnormal posture: Secondary | ICD-10-CM | POA: Diagnosis not present

## 2022-01-14 DIAGNOSIS — I6389 Other cerebral infarction: Secondary | ICD-10-CM | POA: Diagnosis not present

## 2022-01-14 DIAGNOSIS — G8111 Spastic hemiplegia affecting right dominant side: Secondary | ICD-10-CM | POA: Diagnosis not present

## 2022-01-14 DIAGNOSIS — R41841 Cognitive communication deficit: Secondary | ICD-10-CM | POA: Diagnosis not present

## 2022-01-14 DIAGNOSIS — I69328 Other speech and language deficits following cerebral infarction: Secondary | ICD-10-CM | POA: Diagnosis not present

## 2022-01-15 DIAGNOSIS — R293 Abnormal posture: Secondary | ICD-10-CM | POA: Diagnosis not present

## 2022-01-15 DIAGNOSIS — Z8673 Personal history of transient ischemic attack (TIA), and cerebral infarction without residual deficits: Secondary | ICD-10-CM | POA: Diagnosis not present

## 2022-01-15 DIAGNOSIS — M6389 Disorders of muscle in diseases classified elsewhere, multiple sites: Secondary | ICD-10-CM | POA: Diagnosis not present

## 2022-01-15 DIAGNOSIS — G8111 Spastic hemiplegia affecting right dominant side: Secondary | ICD-10-CM | POA: Diagnosis not present

## 2022-01-19 DIAGNOSIS — I6389 Other cerebral infarction: Secondary | ICD-10-CM | POA: Diagnosis not present

## 2022-01-19 DIAGNOSIS — I69328 Other speech and language deficits following cerebral infarction: Secondary | ICD-10-CM | POA: Diagnosis not present

## 2022-01-19 DIAGNOSIS — G8111 Spastic hemiplegia affecting right dominant side: Secondary | ICD-10-CM | POA: Diagnosis not present

## 2022-01-19 DIAGNOSIS — R41841 Cognitive communication deficit: Secondary | ICD-10-CM | POA: Diagnosis not present

## 2022-01-22 DIAGNOSIS — E785 Hyperlipidemia, unspecified: Secondary | ICD-10-CM | POA: Diagnosis not present

## 2022-01-22 DIAGNOSIS — G8111 Spastic hemiplegia affecting right dominant side: Secondary | ICD-10-CM | POA: Diagnosis not present

## 2022-01-22 DIAGNOSIS — R41841 Cognitive communication deficit: Secondary | ICD-10-CM | POA: Diagnosis not present

## 2022-01-22 DIAGNOSIS — E559 Vitamin D deficiency, unspecified: Secondary | ICD-10-CM | POA: Diagnosis not present

## 2022-01-22 DIAGNOSIS — R7989 Other specified abnormal findings of blood chemistry: Secondary | ICD-10-CM | POA: Diagnosis not present

## 2022-01-22 DIAGNOSIS — I10 Essential (primary) hypertension: Secondary | ICD-10-CM | POA: Diagnosis not present

## 2022-01-22 DIAGNOSIS — I6389 Other cerebral infarction: Secondary | ICD-10-CM | POA: Diagnosis not present

## 2022-01-22 DIAGNOSIS — I69328 Other speech and language deficits following cerebral infarction: Secondary | ICD-10-CM | POA: Diagnosis not present

## 2022-01-26 DIAGNOSIS — I69328 Other speech and language deficits following cerebral infarction: Secondary | ICD-10-CM | POA: Diagnosis not present

## 2022-01-26 DIAGNOSIS — I6389 Other cerebral infarction: Secondary | ICD-10-CM | POA: Diagnosis not present

## 2022-01-26 DIAGNOSIS — R41841 Cognitive communication deficit: Secondary | ICD-10-CM | POA: Diagnosis not present

## 2022-01-26 DIAGNOSIS — G8111 Spastic hemiplegia affecting right dominant side: Secondary | ICD-10-CM | POA: Diagnosis not present

## 2022-01-28 DIAGNOSIS — I69328 Other speech and language deficits following cerebral infarction: Secondary | ICD-10-CM | POA: Diagnosis not present

## 2022-01-28 DIAGNOSIS — I6389 Other cerebral infarction: Secondary | ICD-10-CM | POA: Diagnosis not present

## 2022-01-28 DIAGNOSIS — R41841 Cognitive communication deficit: Secondary | ICD-10-CM | POA: Diagnosis not present

## 2022-01-28 DIAGNOSIS — G8111 Spastic hemiplegia affecting right dominant side: Secondary | ICD-10-CM | POA: Diagnosis not present

## 2022-01-29 DIAGNOSIS — I517 Cardiomegaly: Secondary | ICD-10-CM | POA: Diagnosis not present

## 2022-01-29 DIAGNOSIS — Z79899 Other long term (current) drug therapy: Secondary | ICD-10-CM | POA: Diagnosis not present

## 2022-01-29 DIAGNOSIS — Z1339 Encounter for screening examination for other mental health and behavioral disorders: Secondary | ICD-10-CM | POA: Diagnosis not present

## 2022-01-29 DIAGNOSIS — Z1331 Encounter for screening for depression: Secondary | ICD-10-CM | POA: Diagnosis not present

## 2022-01-29 DIAGNOSIS — I69359 Hemiplegia and hemiparesis following cerebral infarction affecting unspecified side: Secondary | ICD-10-CM | POA: Diagnosis not present

## 2022-01-29 DIAGNOSIS — Z0000 Encounter for general adult medical examination without abnormal findings: Secondary | ICD-10-CM | POA: Diagnosis not present

## 2022-01-29 DIAGNOSIS — I6389 Other cerebral infarction: Secondary | ICD-10-CM | POA: Diagnosis not present

## 2022-01-29 DIAGNOSIS — M8580 Other specified disorders of bone density and structure, unspecified site: Secondary | ICD-10-CM | POA: Diagnosis not present

## 2022-01-29 DIAGNOSIS — I69320 Aphasia following cerebral infarction: Secondary | ICD-10-CM | POA: Diagnosis not present

## 2022-01-29 DIAGNOSIS — G8110 Spastic hemiplegia affecting unspecified side: Secondary | ICD-10-CM | POA: Diagnosis not present

## 2022-01-29 DIAGNOSIS — Z23 Encounter for immunization: Secondary | ICD-10-CM | POA: Diagnosis not present

## 2022-01-29 DIAGNOSIS — I10 Essential (primary) hypertension: Secondary | ICD-10-CM | POA: Diagnosis not present

## 2022-01-29 DIAGNOSIS — R82998 Other abnormal findings in urine: Secondary | ICD-10-CM | POA: Diagnosis not present

## 2022-01-29 DIAGNOSIS — I340 Nonrheumatic mitral (valve) insufficiency: Secondary | ICD-10-CM | POA: Diagnosis not present

## 2022-01-29 DIAGNOSIS — I350 Nonrheumatic aortic (valve) stenosis: Secondary | ICD-10-CM | POA: Diagnosis not present

## 2022-02-01 DIAGNOSIS — M6389 Disorders of muscle in diseases classified elsewhere, multiple sites: Secondary | ICD-10-CM | POA: Diagnosis not present

## 2022-02-01 DIAGNOSIS — R41841 Cognitive communication deficit: Secondary | ICD-10-CM | POA: Diagnosis not present

## 2022-02-01 DIAGNOSIS — R293 Abnormal posture: Secondary | ICD-10-CM | POA: Diagnosis not present

## 2022-02-01 DIAGNOSIS — I6389 Other cerebral infarction: Secondary | ICD-10-CM | POA: Diagnosis not present

## 2022-02-01 DIAGNOSIS — G8111 Spastic hemiplegia affecting right dominant side: Secondary | ICD-10-CM | POA: Diagnosis not present

## 2022-02-01 DIAGNOSIS — Z8673 Personal history of transient ischemic attack (TIA), and cerebral infarction without residual deficits: Secondary | ICD-10-CM | POA: Diagnosis not present

## 2022-02-01 DIAGNOSIS — I69328 Other speech and language deficits following cerebral infarction: Secondary | ICD-10-CM | POA: Diagnosis not present

## 2022-02-05 DIAGNOSIS — M6389 Disorders of muscle in diseases classified elsewhere, multiple sites: Secondary | ICD-10-CM | POA: Diagnosis not present

## 2022-02-05 DIAGNOSIS — R41841 Cognitive communication deficit: Secondary | ICD-10-CM | POA: Diagnosis not present

## 2022-02-05 DIAGNOSIS — G8111 Spastic hemiplegia affecting right dominant side: Secondary | ICD-10-CM | POA: Diagnosis not present

## 2022-02-05 DIAGNOSIS — I69328 Other speech and language deficits following cerebral infarction: Secondary | ICD-10-CM | POA: Diagnosis not present

## 2022-02-05 DIAGNOSIS — Z8673 Personal history of transient ischemic attack (TIA), and cerebral infarction without residual deficits: Secondary | ICD-10-CM | POA: Diagnosis not present

## 2022-02-05 DIAGNOSIS — I6389 Other cerebral infarction: Secondary | ICD-10-CM | POA: Diagnosis not present

## 2022-02-05 DIAGNOSIS — R293 Abnormal posture: Secondary | ICD-10-CM | POA: Diagnosis not present

## 2022-02-09 DIAGNOSIS — I6389 Other cerebral infarction: Secondary | ICD-10-CM | POA: Diagnosis not present

## 2022-02-09 DIAGNOSIS — I69328 Other speech and language deficits following cerebral infarction: Secondary | ICD-10-CM | POA: Diagnosis not present

## 2022-02-09 DIAGNOSIS — G8111 Spastic hemiplegia affecting right dominant side: Secondary | ICD-10-CM | POA: Diagnosis not present

## 2022-02-09 DIAGNOSIS — R41841 Cognitive communication deficit: Secondary | ICD-10-CM | POA: Diagnosis not present

## 2022-02-12 DIAGNOSIS — I69328 Other speech and language deficits following cerebral infarction: Secondary | ICD-10-CM | POA: Diagnosis not present

## 2022-02-12 DIAGNOSIS — R41841 Cognitive communication deficit: Secondary | ICD-10-CM | POA: Diagnosis not present

## 2022-02-12 DIAGNOSIS — G8111 Spastic hemiplegia affecting right dominant side: Secondary | ICD-10-CM | POA: Diagnosis not present

## 2022-02-12 DIAGNOSIS — I6389 Other cerebral infarction: Secondary | ICD-10-CM | POA: Diagnosis not present

## 2022-02-16 DIAGNOSIS — R41841 Cognitive communication deficit: Secondary | ICD-10-CM | POA: Diagnosis not present

## 2022-02-16 DIAGNOSIS — I69328 Other speech and language deficits following cerebral infarction: Secondary | ICD-10-CM | POA: Diagnosis not present

## 2022-02-16 DIAGNOSIS — I6389 Other cerebral infarction: Secondary | ICD-10-CM | POA: Diagnosis not present

## 2022-02-16 DIAGNOSIS — G8111 Spastic hemiplegia affecting right dominant side: Secondary | ICD-10-CM | POA: Diagnosis not present

## 2022-02-17 DIAGNOSIS — Z23 Encounter for immunization: Secondary | ICD-10-CM | POA: Diagnosis not present

## 2022-02-19 DIAGNOSIS — G8111 Spastic hemiplegia affecting right dominant side: Secondary | ICD-10-CM | POA: Diagnosis not present

## 2022-02-19 DIAGNOSIS — I6389 Other cerebral infarction: Secondary | ICD-10-CM | POA: Diagnosis not present

## 2022-02-19 DIAGNOSIS — I69328 Other speech and language deficits following cerebral infarction: Secondary | ICD-10-CM | POA: Diagnosis not present

## 2022-02-19 DIAGNOSIS — R41841 Cognitive communication deficit: Secondary | ICD-10-CM | POA: Diagnosis not present

## 2022-02-23 DIAGNOSIS — I69328 Other speech and language deficits following cerebral infarction: Secondary | ICD-10-CM | POA: Diagnosis not present

## 2022-02-23 DIAGNOSIS — R41841 Cognitive communication deficit: Secondary | ICD-10-CM | POA: Diagnosis not present

## 2022-02-23 DIAGNOSIS — Z8673 Personal history of transient ischemic attack (TIA), and cerebral infarction without residual deficits: Secondary | ICD-10-CM | POA: Diagnosis not present

## 2022-02-23 DIAGNOSIS — I6389 Other cerebral infarction: Secondary | ICD-10-CM | POA: Diagnosis not present

## 2022-02-23 DIAGNOSIS — M6389 Disorders of muscle in diseases classified elsewhere, multiple sites: Secondary | ICD-10-CM | POA: Diagnosis not present

## 2022-02-23 DIAGNOSIS — G8111 Spastic hemiplegia affecting right dominant side: Secondary | ICD-10-CM | POA: Diagnosis not present

## 2022-02-23 DIAGNOSIS — R293 Abnormal posture: Secondary | ICD-10-CM | POA: Diagnosis not present

## 2022-02-25 DIAGNOSIS — G8111 Spastic hemiplegia affecting right dominant side: Secondary | ICD-10-CM | POA: Diagnosis not present

## 2022-02-25 DIAGNOSIS — I69328 Other speech and language deficits following cerebral infarction: Secondary | ICD-10-CM | POA: Diagnosis not present

## 2022-02-25 DIAGNOSIS — I6389 Other cerebral infarction: Secondary | ICD-10-CM | POA: Diagnosis not present

## 2022-02-25 DIAGNOSIS — R41841 Cognitive communication deficit: Secondary | ICD-10-CM | POA: Diagnosis not present

## 2022-03-01 DIAGNOSIS — G8111 Spastic hemiplegia affecting right dominant side: Secondary | ICD-10-CM | POA: Diagnosis not present

## 2022-03-01 DIAGNOSIS — I6389 Other cerebral infarction: Secondary | ICD-10-CM | POA: Diagnosis not present

## 2022-03-01 DIAGNOSIS — R41841 Cognitive communication deficit: Secondary | ICD-10-CM | POA: Diagnosis not present

## 2022-03-01 DIAGNOSIS — I69328 Other speech and language deficits following cerebral infarction: Secondary | ICD-10-CM | POA: Diagnosis not present

## 2022-03-02 DIAGNOSIS — R293 Abnormal posture: Secondary | ICD-10-CM | POA: Diagnosis not present

## 2022-03-02 DIAGNOSIS — Z8673 Personal history of transient ischemic attack (TIA), and cerebral infarction without residual deficits: Secondary | ICD-10-CM | POA: Diagnosis not present

## 2022-03-02 DIAGNOSIS — M6389 Disorders of muscle in diseases classified elsewhere, multiple sites: Secondary | ICD-10-CM | POA: Diagnosis not present

## 2022-03-02 DIAGNOSIS — G8111 Spastic hemiplegia affecting right dominant side: Secondary | ICD-10-CM | POA: Diagnosis not present

## 2022-03-04 DIAGNOSIS — G8111 Spastic hemiplegia affecting right dominant side: Secondary | ICD-10-CM | POA: Diagnosis not present

## 2022-03-04 DIAGNOSIS — R41841 Cognitive communication deficit: Secondary | ICD-10-CM | POA: Diagnosis not present

## 2022-03-04 DIAGNOSIS — I69328 Other speech and language deficits following cerebral infarction: Secondary | ICD-10-CM | POA: Diagnosis not present

## 2022-03-04 DIAGNOSIS — I6389 Other cerebral infarction: Secondary | ICD-10-CM | POA: Diagnosis not present

## 2022-03-16 DIAGNOSIS — M6389 Disorders of muscle in diseases classified elsewhere, multiple sites: Secondary | ICD-10-CM | POA: Diagnosis not present

## 2022-03-16 DIAGNOSIS — I69328 Other speech and language deficits following cerebral infarction: Secondary | ICD-10-CM | POA: Diagnosis not present

## 2022-03-16 DIAGNOSIS — R293 Abnormal posture: Secondary | ICD-10-CM | POA: Diagnosis not present

## 2022-03-16 DIAGNOSIS — Z8673 Personal history of transient ischemic attack (TIA), and cerebral infarction without residual deficits: Secondary | ICD-10-CM | POA: Diagnosis not present

## 2022-03-16 DIAGNOSIS — I6389 Other cerebral infarction: Secondary | ICD-10-CM | POA: Diagnosis not present

## 2022-03-16 DIAGNOSIS — R41841 Cognitive communication deficit: Secondary | ICD-10-CM | POA: Diagnosis not present

## 2022-03-16 DIAGNOSIS — G8111 Spastic hemiplegia affecting right dominant side: Secondary | ICD-10-CM | POA: Diagnosis not present

## 2022-03-18 DIAGNOSIS — I69328 Other speech and language deficits following cerebral infarction: Secondary | ICD-10-CM | POA: Diagnosis not present

## 2022-03-18 DIAGNOSIS — R41841 Cognitive communication deficit: Secondary | ICD-10-CM | POA: Diagnosis not present

## 2022-03-18 DIAGNOSIS — I6389 Other cerebral infarction: Secondary | ICD-10-CM | POA: Diagnosis not present

## 2022-03-18 DIAGNOSIS — G8111 Spastic hemiplegia affecting right dominant side: Secondary | ICD-10-CM | POA: Diagnosis not present

## 2022-03-22 DIAGNOSIS — G8111 Spastic hemiplegia affecting right dominant side: Secondary | ICD-10-CM | POA: Diagnosis not present

## 2022-03-22 DIAGNOSIS — I6389 Other cerebral infarction: Secondary | ICD-10-CM | POA: Diagnosis not present

## 2022-03-22 DIAGNOSIS — R41841 Cognitive communication deficit: Secondary | ICD-10-CM | POA: Diagnosis not present

## 2022-03-22 DIAGNOSIS — I69328 Other speech and language deficits following cerebral infarction: Secondary | ICD-10-CM | POA: Diagnosis not present

## 2022-03-24 ENCOUNTER — Encounter: Payer: Medicare Other | Attending: Physical Medicine & Rehabilitation | Admitting: Physical Medicine & Rehabilitation

## 2022-03-24 ENCOUNTER — Encounter: Payer: Self-pay | Admitting: Physical Medicine & Rehabilitation

## 2022-03-24 VITALS — BP 179/86 | HR 67 | Ht 65.0 in | Wt 118.0 lb

## 2022-03-24 DIAGNOSIS — G8110 Spastic hemiplegia affecting unspecified side: Secondary | ICD-10-CM | POA: Diagnosis not present

## 2022-03-24 DIAGNOSIS — I69351 Hemiplegia and hemiparesis following cerebral infarction affecting right dominant side: Secondary | ICD-10-CM | POA: Diagnosis not present

## 2022-03-24 MED ORDER — SODIUM CHLORIDE (PF) 0.9 % IJ SOLN
5.0000 mL | Freq: Once | INTRAMUSCULAR | Status: AC
  Administered 2022-03-24: 5 mL via INTRAVENOUS

## 2022-03-24 MED ORDER — ONABOTULINUMTOXINA 100 UNITS IJ SOLR
500.0000 [IU] | Freq: Once | INTRAMUSCULAR | Status: AC
  Administered 2022-03-24: 500 [IU] via INTRAMUSCULAR

## 2022-03-24 NOTE — Patient Instructions (Signed)
ALWAYS FEEL FREE TO CALL OUR OFFICE WITH ANY PROBLEMS OR QUESTIONS ([phone])  **PLEASE NOTE** ALL MEDICATION REFILL REQUESTS (INCLUDING CONTROLLED SUBSTANCES) NEED TO BE MADE AT LEAST 7 DAYS PRIOR TO REFILL BEING DUE. ANY REFILL REQUESTS INSIDE THAT TIME FRAME MAY RESULT IN DELAYS IN RECEIVING YOUR PRESCRIPTION.                    

## 2022-03-24 NOTE — Progress Notes (Signed)
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 This includes bleeding, bruising, infection, excessive weakness, or medication side effects. A REMS form is on file and signed.  Needle: 10m injectable monopolar needle electrode    Number of units per muscle Pectoralis Major 50 units Pectoralis Minor 50 units Biceps 100 units Brachioradialis 50 units FCR 25 units FCU 25 units FDS 125 units FDP 125 units FPL 0 units Palmaris Longus 0 units Pronator Teres 0 units Pronator Quadratus 0 units Lumbricals 0 units All injections were done after obtaining appropriate EMG activity and after negative drawback for blood. The patient tolerated the procedure well. Post procedure instructions were given. Return in about 3 months (around 06/23/2022) for botox RUE 500 units.

## 2022-03-25 DIAGNOSIS — Z8673 Personal history of transient ischemic attack (TIA), and cerebral infarction without residual deficits: Secondary | ICD-10-CM | POA: Diagnosis not present

## 2022-03-25 DIAGNOSIS — M6389 Disorders of muscle in diseases classified elsewhere, multiple sites: Secondary | ICD-10-CM | POA: Diagnosis not present

## 2022-03-25 DIAGNOSIS — G8111 Spastic hemiplegia affecting right dominant side: Secondary | ICD-10-CM | POA: Diagnosis not present

## 2022-03-25 DIAGNOSIS — R293 Abnormal posture: Secondary | ICD-10-CM | POA: Diagnosis not present

## 2022-03-26 DIAGNOSIS — R41841 Cognitive communication deficit: Secondary | ICD-10-CM | POA: Diagnosis not present

## 2022-03-26 DIAGNOSIS — I69328 Other speech and language deficits following cerebral infarction: Secondary | ICD-10-CM | POA: Diagnosis not present

## 2022-03-26 DIAGNOSIS — I6389 Other cerebral infarction: Secondary | ICD-10-CM | POA: Diagnosis not present

## 2022-03-26 DIAGNOSIS — G8111 Spastic hemiplegia affecting right dominant side: Secondary | ICD-10-CM | POA: Diagnosis not present

## 2022-03-30 DIAGNOSIS — I69328 Other speech and language deficits following cerebral infarction: Secondary | ICD-10-CM | POA: Diagnosis not present

## 2022-03-30 DIAGNOSIS — I6389 Other cerebral infarction: Secondary | ICD-10-CM | POA: Diagnosis not present

## 2022-03-30 DIAGNOSIS — G8111 Spastic hemiplegia affecting right dominant side: Secondary | ICD-10-CM | POA: Diagnosis not present

## 2022-03-30 DIAGNOSIS — R41841 Cognitive communication deficit: Secondary | ICD-10-CM | POA: Diagnosis not present

## 2022-04-01 DIAGNOSIS — I69328 Other speech and language deficits following cerebral infarction: Secondary | ICD-10-CM | POA: Diagnosis not present

## 2022-04-01 DIAGNOSIS — R293 Abnormal posture: Secondary | ICD-10-CM | POA: Diagnosis not present

## 2022-04-01 DIAGNOSIS — G8111 Spastic hemiplegia affecting right dominant side: Secondary | ICD-10-CM | POA: Diagnosis not present

## 2022-04-01 DIAGNOSIS — M6389 Disorders of muscle in diseases classified elsewhere, multiple sites: Secondary | ICD-10-CM | POA: Diagnosis not present

## 2022-04-01 DIAGNOSIS — Z8673 Personal history of transient ischemic attack (TIA), and cerebral infarction without residual deficits: Secondary | ICD-10-CM | POA: Diagnosis not present

## 2022-04-01 DIAGNOSIS — R41841 Cognitive communication deficit: Secondary | ICD-10-CM | POA: Diagnosis not present

## 2022-04-01 DIAGNOSIS — I6389 Other cerebral infarction: Secondary | ICD-10-CM | POA: Diagnosis not present

## 2022-04-06 DIAGNOSIS — I6389 Other cerebral infarction: Secondary | ICD-10-CM | POA: Diagnosis not present

## 2022-04-06 DIAGNOSIS — I69328 Other speech and language deficits following cerebral infarction: Secondary | ICD-10-CM | POA: Diagnosis not present

## 2022-04-06 DIAGNOSIS — G8111 Spastic hemiplegia affecting right dominant side: Secondary | ICD-10-CM | POA: Diagnosis not present

## 2022-04-06 DIAGNOSIS — R41841 Cognitive communication deficit: Secondary | ICD-10-CM | POA: Diagnosis not present

## 2022-04-20 DIAGNOSIS — I6389 Other cerebral infarction: Secondary | ICD-10-CM | POA: Diagnosis not present

## 2022-04-20 DIAGNOSIS — R41841 Cognitive communication deficit: Secondary | ICD-10-CM | POA: Diagnosis not present

## 2022-04-20 DIAGNOSIS — I69328 Other speech and language deficits following cerebral infarction: Secondary | ICD-10-CM | POA: Diagnosis not present

## 2022-04-20 DIAGNOSIS — G8111 Spastic hemiplegia affecting right dominant side: Secondary | ICD-10-CM | POA: Diagnosis not present

## 2022-04-22 DIAGNOSIS — I6389 Other cerebral infarction: Secondary | ICD-10-CM | POA: Diagnosis not present

## 2022-04-22 DIAGNOSIS — G8111 Spastic hemiplegia affecting right dominant side: Secondary | ICD-10-CM | POA: Diagnosis not present

## 2022-04-22 DIAGNOSIS — R41841 Cognitive communication deficit: Secondary | ICD-10-CM | POA: Diagnosis not present

## 2022-04-22 DIAGNOSIS — I69328 Other speech and language deficits following cerebral infarction: Secondary | ICD-10-CM | POA: Diagnosis not present

## 2022-04-29 DIAGNOSIS — R41841 Cognitive communication deficit: Secondary | ICD-10-CM | POA: Diagnosis not present

## 2022-04-29 DIAGNOSIS — I6389 Other cerebral infarction: Secondary | ICD-10-CM | POA: Diagnosis not present

## 2022-04-29 DIAGNOSIS — I69328 Other speech and language deficits following cerebral infarction: Secondary | ICD-10-CM | POA: Diagnosis not present

## 2022-04-29 DIAGNOSIS — G8111 Spastic hemiplegia affecting right dominant side: Secondary | ICD-10-CM | POA: Diagnosis not present

## 2022-05-05 DIAGNOSIS — R41841 Cognitive communication deficit: Secondary | ICD-10-CM | POA: Diagnosis not present

## 2022-05-05 DIAGNOSIS — G8111 Spastic hemiplegia affecting right dominant side: Secondary | ICD-10-CM | POA: Diagnosis not present

## 2022-05-05 DIAGNOSIS — I69328 Other speech and language deficits following cerebral infarction: Secondary | ICD-10-CM | POA: Diagnosis not present

## 2022-05-05 DIAGNOSIS — I6389 Other cerebral infarction: Secondary | ICD-10-CM | POA: Diagnosis not present

## 2022-05-06 DIAGNOSIS — I69328 Other speech and language deficits following cerebral infarction: Secondary | ICD-10-CM | POA: Diagnosis not present

## 2022-05-06 DIAGNOSIS — I6389 Other cerebral infarction: Secondary | ICD-10-CM | POA: Diagnosis not present

## 2022-05-06 DIAGNOSIS — R41841 Cognitive communication deficit: Secondary | ICD-10-CM | POA: Diagnosis not present

## 2022-05-06 DIAGNOSIS — G8111 Spastic hemiplegia affecting right dominant side: Secondary | ICD-10-CM | POA: Diagnosis not present

## 2022-05-11 DIAGNOSIS — I6389 Other cerebral infarction: Secondary | ICD-10-CM | POA: Diagnosis not present

## 2022-05-11 DIAGNOSIS — I69328 Other speech and language deficits following cerebral infarction: Secondary | ICD-10-CM | POA: Diagnosis not present

## 2022-05-11 DIAGNOSIS — R41841 Cognitive communication deficit: Secondary | ICD-10-CM | POA: Diagnosis not present

## 2022-05-11 DIAGNOSIS — G8111 Spastic hemiplegia affecting right dominant side: Secondary | ICD-10-CM | POA: Diagnosis not present

## 2022-05-13 DIAGNOSIS — G8111 Spastic hemiplegia affecting right dominant side: Secondary | ICD-10-CM | POA: Diagnosis not present

## 2022-05-13 DIAGNOSIS — R41841 Cognitive communication deficit: Secondary | ICD-10-CM | POA: Diagnosis not present

## 2022-05-13 DIAGNOSIS — I6389 Other cerebral infarction: Secondary | ICD-10-CM | POA: Diagnosis not present

## 2022-05-13 DIAGNOSIS — I69328 Other speech and language deficits following cerebral infarction: Secondary | ICD-10-CM | POA: Diagnosis not present

## 2022-05-19 DIAGNOSIS — R41841 Cognitive communication deficit: Secondary | ICD-10-CM | POA: Diagnosis not present

## 2022-05-19 DIAGNOSIS — G8111 Spastic hemiplegia affecting right dominant side: Secondary | ICD-10-CM | POA: Diagnosis not present

## 2022-05-19 DIAGNOSIS — I6389 Other cerebral infarction: Secondary | ICD-10-CM | POA: Diagnosis not present

## 2022-05-19 DIAGNOSIS — I69328 Other speech and language deficits following cerebral infarction: Secondary | ICD-10-CM | POA: Diagnosis not present

## 2022-05-21 DIAGNOSIS — R41841 Cognitive communication deficit: Secondary | ICD-10-CM | POA: Diagnosis not present

## 2022-05-21 DIAGNOSIS — G8111 Spastic hemiplegia affecting right dominant side: Secondary | ICD-10-CM | POA: Diagnosis not present

## 2022-05-21 DIAGNOSIS — I6389 Other cerebral infarction: Secondary | ICD-10-CM | POA: Diagnosis not present

## 2022-05-21 DIAGNOSIS — I69328 Other speech and language deficits following cerebral infarction: Secondary | ICD-10-CM | POA: Diagnosis not present

## 2022-05-25 DIAGNOSIS — G8111 Spastic hemiplegia affecting right dominant side: Secondary | ICD-10-CM | POA: Diagnosis not present

## 2022-05-25 DIAGNOSIS — R41841 Cognitive communication deficit: Secondary | ICD-10-CM | POA: Diagnosis not present

## 2022-05-25 DIAGNOSIS — I6389 Other cerebral infarction: Secondary | ICD-10-CM | POA: Diagnosis not present

## 2022-05-25 DIAGNOSIS — I69328 Other speech and language deficits following cerebral infarction: Secondary | ICD-10-CM | POA: Diagnosis not present

## 2022-05-27 DIAGNOSIS — I6389 Other cerebral infarction: Secondary | ICD-10-CM | POA: Diagnosis not present

## 2022-05-27 DIAGNOSIS — R41841 Cognitive communication deficit: Secondary | ICD-10-CM | POA: Diagnosis not present

## 2022-05-27 DIAGNOSIS — I69328 Other speech and language deficits following cerebral infarction: Secondary | ICD-10-CM | POA: Diagnosis not present

## 2022-05-27 DIAGNOSIS — G8111 Spastic hemiplegia affecting right dominant side: Secondary | ICD-10-CM | POA: Diagnosis not present

## 2022-06-03 DIAGNOSIS — R41841 Cognitive communication deficit: Secondary | ICD-10-CM | POA: Diagnosis not present

## 2022-06-03 DIAGNOSIS — I6389 Other cerebral infarction: Secondary | ICD-10-CM | POA: Diagnosis not present

## 2022-06-03 DIAGNOSIS — G8111 Spastic hemiplegia affecting right dominant side: Secondary | ICD-10-CM | POA: Diagnosis not present

## 2022-06-03 DIAGNOSIS — I69328 Other speech and language deficits following cerebral infarction: Secondary | ICD-10-CM | POA: Diagnosis not present

## 2022-06-04 DIAGNOSIS — R41841 Cognitive communication deficit: Secondary | ICD-10-CM | POA: Diagnosis not present

## 2022-06-04 DIAGNOSIS — I69328 Other speech and language deficits following cerebral infarction: Secondary | ICD-10-CM | POA: Diagnosis not present

## 2022-06-04 DIAGNOSIS — I6389 Other cerebral infarction: Secondary | ICD-10-CM | POA: Diagnosis not present

## 2022-06-04 DIAGNOSIS — G8111 Spastic hemiplegia affecting right dominant side: Secondary | ICD-10-CM | POA: Diagnosis not present

## 2022-06-07 DIAGNOSIS — I69328 Other speech and language deficits following cerebral infarction: Secondary | ICD-10-CM | POA: Diagnosis not present

## 2022-06-07 DIAGNOSIS — R41841 Cognitive communication deficit: Secondary | ICD-10-CM | POA: Diagnosis not present

## 2022-06-07 DIAGNOSIS — I6389 Other cerebral infarction: Secondary | ICD-10-CM | POA: Diagnosis not present

## 2022-06-07 DIAGNOSIS — G8111 Spastic hemiplegia affecting right dominant side: Secondary | ICD-10-CM | POA: Diagnosis not present

## 2022-06-10 DIAGNOSIS — I69328 Other speech and language deficits following cerebral infarction: Secondary | ICD-10-CM | POA: Diagnosis not present

## 2022-06-10 DIAGNOSIS — G8111 Spastic hemiplegia affecting right dominant side: Secondary | ICD-10-CM | POA: Diagnosis not present

## 2022-06-10 DIAGNOSIS — R41841 Cognitive communication deficit: Secondary | ICD-10-CM | POA: Diagnosis not present

## 2022-06-10 DIAGNOSIS — I6389 Other cerebral infarction: Secondary | ICD-10-CM | POA: Diagnosis not present

## 2022-06-16 DIAGNOSIS — G8111 Spastic hemiplegia affecting right dominant side: Secondary | ICD-10-CM | POA: Diagnosis not present

## 2022-06-16 DIAGNOSIS — I6389 Other cerebral infarction: Secondary | ICD-10-CM | POA: Diagnosis not present

## 2022-06-16 DIAGNOSIS — R41841 Cognitive communication deficit: Secondary | ICD-10-CM | POA: Diagnosis not present

## 2022-06-16 DIAGNOSIS — I69328 Other speech and language deficits following cerebral infarction: Secondary | ICD-10-CM | POA: Diagnosis not present

## 2022-06-18 DIAGNOSIS — G8111 Spastic hemiplegia affecting right dominant side: Secondary | ICD-10-CM | POA: Diagnosis not present

## 2022-06-18 DIAGNOSIS — R41841 Cognitive communication deficit: Secondary | ICD-10-CM | POA: Diagnosis not present

## 2022-06-18 DIAGNOSIS — I6389 Other cerebral infarction: Secondary | ICD-10-CM | POA: Diagnosis not present

## 2022-06-18 DIAGNOSIS — I69328 Other speech and language deficits following cerebral infarction: Secondary | ICD-10-CM | POA: Diagnosis not present

## 2022-06-22 DIAGNOSIS — R41841 Cognitive communication deficit: Secondary | ICD-10-CM | POA: Diagnosis not present

## 2022-06-22 DIAGNOSIS — I69328 Other speech and language deficits following cerebral infarction: Secondary | ICD-10-CM | POA: Diagnosis not present

## 2022-06-22 DIAGNOSIS — I6389 Other cerebral infarction: Secondary | ICD-10-CM | POA: Diagnosis not present

## 2022-06-22 DIAGNOSIS — G8111 Spastic hemiplegia affecting right dominant side: Secondary | ICD-10-CM | POA: Diagnosis not present

## 2022-06-23 ENCOUNTER — Encounter: Payer: Self-pay | Admitting: Physical Medicine & Rehabilitation

## 2022-06-23 ENCOUNTER — Encounter: Payer: Medicare Other | Attending: Physical Medicine & Rehabilitation | Admitting: Physical Medicine & Rehabilitation

## 2022-06-23 VITALS — BP 138/74 | HR 68 | Ht 65.0 in | Wt 118.0 lb

## 2022-06-23 DIAGNOSIS — I69351 Hemiplegia and hemiparesis following cerebral infarction affecting right dominant side: Secondary | ICD-10-CM | POA: Diagnosis not present

## 2022-06-23 MED ORDER — ONABOTULINUMTOXINA 100 UNITS IJ SOLR
500.0000 [IU] | Freq: Once | INTRAMUSCULAR | Status: AC
  Administered 2022-06-23: 500 [IU] via INTRAMUSCULAR

## 2022-06-23 MED ORDER — SODIUM CHLORIDE (PF) 0.9 % IJ SOLN
5.0000 mL | Freq: Once | INTRAMUSCULAR | Status: AC
  Administered 2022-06-23: 5 mL via INTRAVENOUS

## 2022-06-23 NOTE — Progress Notes (Signed)
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 This includes bleeding, bruising, infection, excessive weakness, or medication side effects. A REMS form is on file and signed.  Needle: 75m injectable monopolar needle electrode    Number of units per muscle Pectoralis Major 50 units Pectoralis Minor 50 units Biceps 100 units Brachioradialis 50 units FCR 20 units FCU 20 units FDS 80 units FDP 80 units FPL 0 units Palmaris Longus 0 units Pronator Teres 50 units Pronator Quadratus 0 units Lumbricals 100 units All injections were done after obtaining appropriate EMG activity and after negative drawback for blood. The patient tolerated the procedure well. Post procedure instructions were given. No follow-ups on file.

## 2022-06-23 NOTE — Patient Instructions (Signed)
ALWAYS FEEL FREE TO CALL OUR OFFICE WITH ANY PROBLEMS OR QUESTIONS ([phone])  **PLEASE NOTE** ALL MEDICATION REFILL REQUESTS (INCLUDING CONTROLLED SUBSTANCES) NEED TO BE MADE AT LEAST 7 DAYS PRIOR TO REFILL BEING DUE. ANY REFILL REQUESTS INSIDE THAT TIME FRAME MAY RESULT IN DELAYS IN RECEIVING YOUR PRESCRIPTION.                    

## 2022-06-25 DIAGNOSIS — I6389 Other cerebral infarction: Secondary | ICD-10-CM | POA: Diagnosis not present

## 2022-06-25 DIAGNOSIS — I69328 Other speech and language deficits following cerebral infarction: Secondary | ICD-10-CM | POA: Diagnosis not present

## 2022-06-25 DIAGNOSIS — G8111 Spastic hemiplegia affecting right dominant side: Secondary | ICD-10-CM | POA: Diagnosis not present

## 2022-06-25 DIAGNOSIS — R41841 Cognitive communication deficit: Secondary | ICD-10-CM | POA: Diagnosis not present

## 2022-06-29 DIAGNOSIS — R41841 Cognitive communication deficit: Secondary | ICD-10-CM | POA: Diagnosis not present

## 2022-06-29 DIAGNOSIS — I69328 Other speech and language deficits following cerebral infarction: Secondary | ICD-10-CM | POA: Diagnosis not present

## 2022-06-29 DIAGNOSIS — G8111 Spastic hemiplegia affecting right dominant side: Secondary | ICD-10-CM | POA: Diagnosis not present

## 2022-06-29 DIAGNOSIS — I6389 Other cerebral infarction: Secondary | ICD-10-CM | POA: Diagnosis not present

## 2022-07-01 DIAGNOSIS — I6389 Other cerebral infarction: Secondary | ICD-10-CM | POA: Diagnosis not present

## 2022-07-01 DIAGNOSIS — G8111 Spastic hemiplegia affecting right dominant side: Secondary | ICD-10-CM | POA: Diagnosis not present

## 2022-07-01 DIAGNOSIS — I69328 Other speech and language deficits following cerebral infarction: Secondary | ICD-10-CM | POA: Diagnosis not present

## 2022-07-01 DIAGNOSIS — R41841 Cognitive communication deficit: Secondary | ICD-10-CM | POA: Diagnosis not present

## 2022-07-07 DIAGNOSIS — G8111 Spastic hemiplegia affecting right dominant side: Secondary | ICD-10-CM | POA: Diagnosis not present

## 2022-07-07 DIAGNOSIS — R41841 Cognitive communication deficit: Secondary | ICD-10-CM | POA: Diagnosis not present

## 2022-07-07 DIAGNOSIS — I69328 Other speech and language deficits following cerebral infarction: Secondary | ICD-10-CM | POA: Diagnosis not present

## 2022-07-07 DIAGNOSIS — I6389 Other cerebral infarction: Secondary | ICD-10-CM | POA: Diagnosis not present

## 2022-07-08 DIAGNOSIS — R41841 Cognitive communication deficit: Secondary | ICD-10-CM | POA: Diagnosis not present

## 2022-07-08 DIAGNOSIS — G8111 Spastic hemiplegia affecting right dominant side: Secondary | ICD-10-CM | POA: Diagnosis not present

## 2022-07-08 DIAGNOSIS — I6389 Other cerebral infarction: Secondary | ICD-10-CM | POA: Diagnosis not present

## 2022-07-08 DIAGNOSIS — I69328 Other speech and language deficits following cerebral infarction: Secondary | ICD-10-CM | POA: Diagnosis not present

## 2022-07-09 ENCOUNTER — Other Ambulatory Visit (HOSPITAL_COMMUNITY): Payer: Self-pay | Admitting: *Deleted

## 2022-07-12 ENCOUNTER — Ambulatory Visit (HOSPITAL_COMMUNITY)
Admission: RE | Admit: 2022-07-12 | Discharge: 2022-07-12 | Disposition: A | Discharge status: Home / Self Care | Payer: Medicare Other | Source: Ambulatory Visit | Attending: Internal Medicine | Admitting: Internal Medicine

## 2022-07-12 DIAGNOSIS — M810 Age-related osteoporosis without current pathological fracture: Secondary | ICD-10-CM | POA: Insufficient documentation

## 2022-07-12 MED ORDER — DENOSUMAB 60 MG/ML ~~LOC~~ SOSY
60.0000 mg | PREFILLED_SYRINGE | Freq: Once | SUBCUTANEOUS | Status: AC
  Administered 2022-07-12: 60 mg via SUBCUTANEOUS

## 2022-07-12 MED ORDER — DENOSUMAB 60 MG/ML ~~LOC~~ SOSY
PREFILLED_SYRINGE | SUBCUTANEOUS | Status: AC
  Filled 2022-07-12: qty 1

## 2022-07-20 DIAGNOSIS — I69328 Other speech and language deficits following cerebral infarction: Secondary | ICD-10-CM | POA: Diagnosis not present

## 2022-07-20 DIAGNOSIS — I6389 Other cerebral infarction: Secondary | ICD-10-CM | POA: Diagnosis not present

## 2022-07-20 DIAGNOSIS — R41841 Cognitive communication deficit: Secondary | ICD-10-CM | POA: Diagnosis not present

## 2022-07-20 DIAGNOSIS — G8111 Spastic hemiplegia affecting right dominant side: Secondary | ICD-10-CM | POA: Diagnosis not present

## 2022-07-22 DIAGNOSIS — I6389 Other cerebral infarction: Secondary | ICD-10-CM | POA: Diagnosis not present

## 2022-07-22 DIAGNOSIS — I69328 Other speech and language deficits following cerebral infarction: Secondary | ICD-10-CM | POA: Diagnosis not present

## 2022-07-22 DIAGNOSIS — G8111 Spastic hemiplegia affecting right dominant side: Secondary | ICD-10-CM | POA: Diagnosis not present

## 2022-07-22 DIAGNOSIS — R41841 Cognitive communication deficit: Secondary | ICD-10-CM | POA: Diagnosis not present

## 2022-07-27 DIAGNOSIS — I6389 Other cerebral infarction: Secondary | ICD-10-CM | POA: Diagnosis not present

## 2022-07-27 DIAGNOSIS — I69328 Other speech and language deficits following cerebral infarction: Secondary | ICD-10-CM | POA: Diagnosis not present

## 2022-07-27 DIAGNOSIS — G8111 Spastic hemiplegia affecting right dominant side: Secondary | ICD-10-CM | POA: Diagnosis not present

## 2022-07-27 DIAGNOSIS — R41841 Cognitive communication deficit: Secondary | ICD-10-CM | POA: Diagnosis not present

## 2022-07-29 DIAGNOSIS — R41841 Cognitive communication deficit: Secondary | ICD-10-CM | POA: Diagnosis not present

## 2022-07-29 DIAGNOSIS — I6389 Other cerebral infarction: Secondary | ICD-10-CM | POA: Diagnosis not present

## 2022-07-29 DIAGNOSIS — G8111 Spastic hemiplegia affecting right dominant side: Secondary | ICD-10-CM | POA: Diagnosis not present

## 2022-07-29 DIAGNOSIS — I69328 Other speech and language deficits following cerebral infarction: Secondary | ICD-10-CM | POA: Diagnosis not present

## 2022-08-02 DIAGNOSIS — I6389 Other cerebral infarction: Secondary | ICD-10-CM | POA: Diagnosis not present

## 2022-08-02 DIAGNOSIS — I69328 Other speech and language deficits following cerebral infarction: Secondary | ICD-10-CM | POA: Diagnosis not present

## 2022-08-02 DIAGNOSIS — R41841 Cognitive communication deficit: Secondary | ICD-10-CM | POA: Diagnosis not present

## 2022-08-02 DIAGNOSIS — G8111 Spastic hemiplegia affecting right dominant side: Secondary | ICD-10-CM | POA: Diagnosis not present

## 2022-08-05 DIAGNOSIS — I69328 Other speech and language deficits following cerebral infarction: Secondary | ICD-10-CM | POA: Diagnosis not present

## 2022-08-05 DIAGNOSIS — R41841 Cognitive communication deficit: Secondary | ICD-10-CM | POA: Diagnosis not present

## 2022-08-05 DIAGNOSIS — I6389 Other cerebral infarction: Secondary | ICD-10-CM | POA: Diagnosis not present

## 2022-08-05 DIAGNOSIS — G8111 Spastic hemiplegia affecting right dominant side: Secondary | ICD-10-CM | POA: Diagnosis not present

## 2022-08-10 DIAGNOSIS — I69328 Other speech and language deficits following cerebral infarction: Secondary | ICD-10-CM | POA: Diagnosis not present

## 2022-08-10 DIAGNOSIS — G8111 Spastic hemiplegia affecting right dominant side: Secondary | ICD-10-CM | POA: Diagnosis not present

## 2022-08-10 DIAGNOSIS — I6389 Other cerebral infarction: Secondary | ICD-10-CM | POA: Diagnosis not present

## 2022-08-10 DIAGNOSIS — R41841 Cognitive communication deficit: Secondary | ICD-10-CM | POA: Diagnosis not present

## 2022-08-12 DIAGNOSIS — Z23 Encounter for immunization: Secondary | ICD-10-CM | POA: Diagnosis not present

## 2022-08-13 DIAGNOSIS — I6389 Other cerebral infarction: Secondary | ICD-10-CM | POA: Diagnosis not present

## 2022-08-13 DIAGNOSIS — R41841 Cognitive communication deficit: Secondary | ICD-10-CM | POA: Diagnosis not present

## 2022-08-13 DIAGNOSIS — G8111 Spastic hemiplegia affecting right dominant side: Secondary | ICD-10-CM | POA: Diagnosis not present

## 2022-08-13 DIAGNOSIS — I69328 Other speech and language deficits following cerebral infarction: Secondary | ICD-10-CM | POA: Diagnosis not present

## 2022-08-18 DIAGNOSIS — G8111 Spastic hemiplegia affecting right dominant side: Secondary | ICD-10-CM | POA: Diagnosis not present

## 2022-08-18 DIAGNOSIS — I6389 Other cerebral infarction: Secondary | ICD-10-CM | POA: Diagnosis not present

## 2022-08-18 DIAGNOSIS — I69328 Other speech and language deficits following cerebral infarction: Secondary | ICD-10-CM | POA: Diagnosis not present

## 2022-08-18 DIAGNOSIS — R41841 Cognitive communication deficit: Secondary | ICD-10-CM | POA: Diagnosis not present

## 2022-08-20 DIAGNOSIS — I69328 Other speech and language deficits following cerebral infarction: Secondary | ICD-10-CM | POA: Diagnosis not present

## 2022-08-20 DIAGNOSIS — G8111 Spastic hemiplegia affecting right dominant side: Secondary | ICD-10-CM | POA: Diagnosis not present

## 2022-08-20 DIAGNOSIS — I6389 Other cerebral infarction: Secondary | ICD-10-CM | POA: Diagnosis not present

## 2022-08-20 DIAGNOSIS — R41841 Cognitive communication deficit: Secondary | ICD-10-CM | POA: Diagnosis not present

## 2022-08-23 DIAGNOSIS — I6389 Other cerebral infarction: Secondary | ICD-10-CM | POA: Diagnosis not present

## 2022-08-23 DIAGNOSIS — G8111 Spastic hemiplegia affecting right dominant side: Secondary | ICD-10-CM | POA: Diagnosis not present

## 2022-08-23 DIAGNOSIS — R41841 Cognitive communication deficit: Secondary | ICD-10-CM | POA: Diagnosis not present

## 2022-08-23 DIAGNOSIS — I69328 Other speech and language deficits following cerebral infarction: Secondary | ICD-10-CM | POA: Diagnosis not present

## 2022-08-25 DIAGNOSIS — I6389 Other cerebral infarction: Secondary | ICD-10-CM | POA: Diagnosis not present

## 2022-08-25 DIAGNOSIS — G8111 Spastic hemiplegia affecting right dominant side: Secondary | ICD-10-CM | POA: Diagnosis not present

## 2022-08-25 DIAGNOSIS — R41841 Cognitive communication deficit: Secondary | ICD-10-CM | POA: Diagnosis not present

## 2022-08-25 DIAGNOSIS — I69328 Other speech and language deficits following cerebral infarction: Secondary | ICD-10-CM | POA: Diagnosis not present

## 2022-08-31 DIAGNOSIS — I6389 Other cerebral infarction: Secondary | ICD-10-CM | POA: Diagnosis not present

## 2022-08-31 DIAGNOSIS — R41841 Cognitive communication deficit: Secondary | ICD-10-CM | POA: Diagnosis not present

## 2022-08-31 DIAGNOSIS — G8111 Spastic hemiplegia affecting right dominant side: Secondary | ICD-10-CM | POA: Diagnosis not present

## 2022-08-31 DIAGNOSIS — I69328 Other speech and language deficits following cerebral infarction: Secondary | ICD-10-CM | POA: Diagnosis not present

## 2022-09-02 DIAGNOSIS — R41841 Cognitive communication deficit: Secondary | ICD-10-CM | POA: Diagnosis not present

## 2022-09-02 DIAGNOSIS — I69328 Other speech and language deficits following cerebral infarction: Secondary | ICD-10-CM | POA: Diagnosis not present

## 2022-09-02 DIAGNOSIS — G8111 Spastic hemiplegia affecting right dominant side: Secondary | ICD-10-CM | POA: Diagnosis not present

## 2022-09-02 DIAGNOSIS — I6389 Other cerebral infarction: Secondary | ICD-10-CM | POA: Diagnosis not present

## 2022-09-06 DIAGNOSIS — H52203 Unspecified astigmatism, bilateral: Secondary | ICD-10-CM | POA: Diagnosis not present

## 2022-09-06 DIAGNOSIS — H401112 Primary open-angle glaucoma, right eye, moderate stage: Secondary | ICD-10-CM | POA: Diagnosis not present

## 2022-09-07 DIAGNOSIS — R41841 Cognitive communication deficit: Secondary | ICD-10-CM | POA: Diagnosis not present

## 2022-09-07 DIAGNOSIS — I69328 Other speech and language deficits following cerebral infarction: Secondary | ICD-10-CM | POA: Diagnosis not present

## 2022-09-07 DIAGNOSIS — G8111 Spastic hemiplegia affecting right dominant side: Secondary | ICD-10-CM | POA: Diagnosis not present

## 2022-09-07 DIAGNOSIS — I6389 Other cerebral infarction: Secondary | ICD-10-CM | POA: Diagnosis not present

## 2022-09-08 DIAGNOSIS — I69328 Other speech and language deficits following cerebral infarction: Secondary | ICD-10-CM | POA: Diagnosis not present

## 2022-09-08 DIAGNOSIS — R41841 Cognitive communication deficit: Secondary | ICD-10-CM | POA: Diagnosis not present

## 2022-09-08 DIAGNOSIS — G8111 Spastic hemiplegia affecting right dominant side: Secondary | ICD-10-CM | POA: Diagnosis not present

## 2022-09-08 DIAGNOSIS — I6389 Other cerebral infarction: Secondary | ICD-10-CM | POA: Diagnosis not present

## 2022-09-14 DIAGNOSIS — I6389 Other cerebral infarction: Secondary | ICD-10-CM | POA: Diagnosis not present

## 2022-09-14 DIAGNOSIS — I69328 Other speech and language deficits following cerebral infarction: Secondary | ICD-10-CM | POA: Diagnosis not present

## 2022-09-14 DIAGNOSIS — R41841 Cognitive communication deficit: Secondary | ICD-10-CM | POA: Diagnosis not present

## 2022-09-14 DIAGNOSIS — G8111 Spastic hemiplegia affecting right dominant side: Secondary | ICD-10-CM | POA: Diagnosis not present

## 2022-09-16 DIAGNOSIS — I69328 Other speech and language deficits following cerebral infarction: Secondary | ICD-10-CM | POA: Diagnosis not present

## 2022-09-16 DIAGNOSIS — I6389 Other cerebral infarction: Secondary | ICD-10-CM | POA: Diagnosis not present

## 2022-09-16 DIAGNOSIS — G8111 Spastic hemiplegia affecting right dominant side: Secondary | ICD-10-CM | POA: Diagnosis not present

## 2022-09-16 DIAGNOSIS — R41841 Cognitive communication deficit: Secondary | ICD-10-CM | POA: Diagnosis not present

## 2022-09-20 DIAGNOSIS — I340 Nonrheumatic mitral (valve) insufficiency: Secondary | ICD-10-CM | POA: Diagnosis not present

## 2022-09-20 DIAGNOSIS — I517 Cardiomegaly: Secondary | ICD-10-CM | POA: Diagnosis not present

## 2022-09-20 DIAGNOSIS — E785 Hyperlipidemia, unspecified: Secondary | ICD-10-CM | POA: Diagnosis not present

## 2022-09-20 DIAGNOSIS — I6389 Other cerebral infarction: Secondary | ICD-10-CM | POA: Diagnosis not present

## 2022-09-20 DIAGNOSIS — J45909 Unspecified asthma, uncomplicated: Secondary | ICD-10-CM | POA: Diagnosis not present

## 2022-09-20 DIAGNOSIS — G8110 Spastic hemiplegia affecting unspecified side: Secondary | ICD-10-CM | POA: Diagnosis not present

## 2022-09-20 DIAGNOSIS — I69359 Hemiplegia and hemiparesis following cerebral infarction affecting unspecified side: Secondary | ICD-10-CM | POA: Diagnosis not present

## 2022-09-20 DIAGNOSIS — I350 Nonrheumatic aortic (valve) stenosis: Secondary | ICD-10-CM | POA: Diagnosis not present

## 2022-09-20 DIAGNOSIS — I69320 Aphasia following cerebral infarction: Secondary | ICD-10-CM | POA: Diagnosis not present

## 2022-09-20 DIAGNOSIS — M8580 Other specified disorders of bone density and structure, unspecified site: Secondary | ICD-10-CM | POA: Diagnosis not present

## 2022-09-20 DIAGNOSIS — B009 Herpesviral infection, unspecified: Secondary | ICD-10-CM | POA: Diagnosis not present

## 2022-09-20 DIAGNOSIS — I10 Essential (primary) hypertension: Secondary | ICD-10-CM | POA: Diagnosis not present

## 2022-09-21 DIAGNOSIS — R41841 Cognitive communication deficit: Secondary | ICD-10-CM | POA: Diagnosis not present

## 2022-09-21 DIAGNOSIS — I6389 Other cerebral infarction: Secondary | ICD-10-CM | POA: Diagnosis not present

## 2022-09-21 DIAGNOSIS — I69328 Other speech and language deficits following cerebral infarction: Secondary | ICD-10-CM | POA: Diagnosis not present

## 2022-09-21 DIAGNOSIS — G8111 Spastic hemiplegia affecting right dominant side: Secondary | ICD-10-CM | POA: Diagnosis not present

## 2022-09-22 ENCOUNTER — Encounter: Payer: Self-pay | Admitting: Physical Medicine & Rehabilitation

## 2022-09-22 ENCOUNTER — Encounter: Payer: Medicare Other | Attending: Physical Medicine & Rehabilitation | Admitting: Physical Medicine & Rehabilitation

## 2022-09-22 VITALS — BP 134/71 | HR 69 | Ht 65.0 in | Wt 118.0 lb

## 2022-09-22 DIAGNOSIS — I69351 Hemiplegia and hemiparesis following cerebral infarction affecting right dominant side: Secondary | ICD-10-CM | POA: Insufficient documentation

## 2022-09-22 MED ORDER — INCOBOTULINUMTOXINA 100 UNITS IM SOLR
500.0000 [IU] | Freq: Once | INTRAMUSCULAR | Status: AC
  Administered 2022-09-22: 500 [IU] via INTRAMUSCULAR

## 2022-09-22 MED ORDER — SODIUM CHLORIDE (PF) 0.9 % IJ SOLN
5.0000 mL | Freq: Once | INTRAMUSCULAR | Status: AC
  Administered 2022-09-22: 5 mL

## 2022-09-22 NOTE — Progress Notes (Signed)
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 This includes bleeding, bruising, infection, excessive weakness, or medication side effects. A REMS form is on file and signed.  Needle: 50mm injectable monopolar needle electrode    Number of units per muscle Pectoralis Major 100 units Pectoralis Minor 100 units Teres major/minor 50uints, lats 50 units Biceps 150 units Brachioradialis 50 units FCR 0 units FCU 0 units FDS 0 units FDP 0 units FPL 0 units Palmaris Longus 0 units Pronator Teres 0 units Pronator Quadratus 0 units Lumbricals 0 units All injections were done after obtaining appropriate EMG activity and after negative drawback for blood. The patient tolerated the procedure well. Post procedure instructions were given. Return in about 3 months (around 12/23/2022) for botox 500u rue.

## 2022-09-22 NOTE — Patient Instructions (Signed)
ALWAYS FEEL FREE TO CALL OUR OFFICE WITH ANY PROBLEMS OR QUESTIONS ([phone])  **PLEASE NOTE** ALL MEDICATION REFILL REQUESTS (INCLUDING CONTROLLED SUBSTANCES) NEED TO BE MADE AT LEAST 7 DAYS PRIOR TO REFILL BEING DUE. ANY REFILL REQUESTS INSIDE THAT TIME FRAME MAY RESULT IN DELAYS IN RECEIVING YOUR PRESCRIPTION.                    

## 2022-09-23 DIAGNOSIS — I6389 Other cerebral infarction: Secondary | ICD-10-CM | POA: Diagnosis not present

## 2022-09-23 DIAGNOSIS — R41841 Cognitive communication deficit: Secondary | ICD-10-CM | POA: Diagnosis not present

## 2022-09-23 DIAGNOSIS — I69328 Other speech and language deficits following cerebral infarction: Secondary | ICD-10-CM | POA: Diagnosis not present

## 2022-09-23 DIAGNOSIS — G8111 Spastic hemiplegia affecting right dominant side: Secondary | ICD-10-CM | POA: Diagnosis not present

## 2022-09-28 DIAGNOSIS — I6389 Other cerebral infarction: Secondary | ICD-10-CM | POA: Diagnosis not present

## 2022-09-28 DIAGNOSIS — I69328 Other speech and language deficits following cerebral infarction: Secondary | ICD-10-CM | POA: Diagnosis not present

## 2022-09-28 DIAGNOSIS — R41841 Cognitive communication deficit: Secondary | ICD-10-CM | POA: Diagnosis not present

## 2022-09-28 DIAGNOSIS — G8111 Spastic hemiplegia affecting right dominant side: Secondary | ICD-10-CM | POA: Diagnosis not present

## 2022-09-30 DIAGNOSIS — R41841 Cognitive communication deficit: Secondary | ICD-10-CM | POA: Diagnosis not present

## 2022-09-30 DIAGNOSIS — G8111 Spastic hemiplegia affecting right dominant side: Secondary | ICD-10-CM | POA: Diagnosis not present

## 2022-09-30 DIAGNOSIS — I6389 Other cerebral infarction: Secondary | ICD-10-CM | POA: Diagnosis not present

## 2022-09-30 DIAGNOSIS — I69328 Other speech and language deficits following cerebral infarction: Secondary | ICD-10-CM | POA: Diagnosis not present

## 2022-10-06 DIAGNOSIS — I6389 Other cerebral infarction: Secondary | ICD-10-CM | POA: Diagnosis not present

## 2022-10-06 DIAGNOSIS — R41841 Cognitive communication deficit: Secondary | ICD-10-CM | POA: Diagnosis not present

## 2022-10-06 DIAGNOSIS — I69328 Other speech and language deficits following cerebral infarction: Secondary | ICD-10-CM | POA: Diagnosis not present

## 2022-10-06 DIAGNOSIS — G8111 Spastic hemiplegia affecting right dominant side: Secondary | ICD-10-CM | POA: Diagnosis not present

## 2022-10-07 DIAGNOSIS — I69328 Other speech and language deficits following cerebral infarction: Secondary | ICD-10-CM | POA: Diagnosis not present

## 2022-10-07 DIAGNOSIS — R41841 Cognitive communication deficit: Secondary | ICD-10-CM | POA: Diagnosis not present

## 2022-10-07 DIAGNOSIS — G8111 Spastic hemiplegia affecting right dominant side: Secondary | ICD-10-CM | POA: Diagnosis not present

## 2022-10-07 DIAGNOSIS — I6389 Other cerebral infarction: Secondary | ICD-10-CM | POA: Diagnosis not present

## 2022-10-13 DIAGNOSIS — I6389 Other cerebral infarction: Secondary | ICD-10-CM | POA: Diagnosis not present

## 2022-10-13 DIAGNOSIS — R41841 Cognitive communication deficit: Secondary | ICD-10-CM | POA: Diagnosis not present

## 2022-10-13 DIAGNOSIS — G8111 Spastic hemiplegia affecting right dominant side: Secondary | ICD-10-CM | POA: Diagnosis not present

## 2022-10-13 DIAGNOSIS — I69328 Other speech and language deficits following cerebral infarction: Secondary | ICD-10-CM | POA: Diagnosis not present

## 2022-10-14 DIAGNOSIS — I69328 Other speech and language deficits following cerebral infarction: Secondary | ICD-10-CM | POA: Diagnosis not present

## 2022-10-14 DIAGNOSIS — I6389 Other cerebral infarction: Secondary | ICD-10-CM | POA: Diagnosis not present

## 2022-10-14 DIAGNOSIS — R41841 Cognitive communication deficit: Secondary | ICD-10-CM | POA: Diagnosis not present

## 2022-10-14 DIAGNOSIS — G8111 Spastic hemiplegia affecting right dominant side: Secondary | ICD-10-CM | POA: Diagnosis not present

## 2022-10-19 DIAGNOSIS — I69328 Other speech and language deficits following cerebral infarction: Secondary | ICD-10-CM | POA: Diagnosis not present

## 2022-10-19 DIAGNOSIS — I6389 Other cerebral infarction: Secondary | ICD-10-CM | POA: Diagnosis not present

## 2022-10-19 DIAGNOSIS — G8111 Spastic hemiplegia affecting right dominant side: Secondary | ICD-10-CM | POA: Diagnosis not present

## 2022-10-19 DIAGNOSIS — R41841 Cognitive communication deficit: Secondary | ICD-10-CM | POA: Diagnosis not present

## 2022-10-22 DIAGNOSIS — R41841 Cognitive communication deficit: Secondary | ICD-10-CM | POA: Diagnosis not present

## 2022-10-22 DIAGNOSIS — I6389 Other cerebral infarction: Secondary | ICD-10-CM | POA: Diagnosis not present

## 2022-10-22 DIAGNOSIS — G8111 Spastic hemiplegia affecting right dominant side: Secondary | ICD-10-CM | POA: Diagnosis not present

## 2022-10-22 DIAGNOSIS — I69328 Other speech and language deficits following cerebral infarction: Secondary | ICD-10-CM | POA: Diagnosis not present

## 2022-10-26 DIAGNOSIS — G8111 Spastic hemiplegia affecting right dominant side: Secondary | ICD-10-CM | POA: Diagnosis not present

## 2022-10-26 DIAGNOSIS — R41841 Cognitive communication deficit: Secondary | ICD-10-CM | POA: Diagnosis not present

## 2022-10-26 DIAGNOSIS — I6389 Other cerebral infarction: Secondary | ICD-10-CM | POA: Diagnosis not present

## 2022-10-26 DIAGNOSIS — I69328 Other speech and language deficits following cerebral infarction: Secondary | ICD-10-CM | POA: Diagnosis not present

## 2022-10-28 DIAGNOSIS — I6389 Other cerebral infarction: Secondary | ICD-10-CM | POA: Diagnosis not present

## 2022-10-28 DIAGNOSIS — G8111 Spastic hemiplegia affecting right dominant side: Secondary | ICD-10-CM | POA: Diagnosis not present

## 2022-10-28 DIAGNOSIS — R41841 Cognitive communication deficit: Secondary | ICD-10-CM | POA: Diagnosis not present

## 2022-10-28 DIAGNOSIS — I69328 Other speech and language deficits following cerebral infarction: Secondary | ICD-10-CM | POA: Diagnosis not present

## 2022-11-02 DIAGNOSIS — G8111 Spastic hemiplegia affecting right dominant side: Secondary | ICD-10-CM | POA: Diagnosis not present

## 2022-11-02 DIAGNOSIS — I6389 Other cerebral infarction: Secondary | ICD-10-CM | POA: Diagnosis not present

## 2022-11-02 DIAGNOSIS — I69328 Other speech and language deficits following cerebral infarction: Secondary | ICD-10-CM | POA: Diagnosis not present

## 2022-11-02 DIAGNOSIS — R41841 Cognitive communication deficit: Secondary | ICD-10-CM | POA: Diagnosis not present

## 2022-11-04 DIAGNOSIS — R41841 Cognitive communication deficit: Secondary | ICD-10-CM | POA: Diagnosis not present

## 2022-11-04 DIAGNOSIS — I69328 Other speech and language deficits following cerebral infarction: Secondary | ICD-10-CM | POA: Diagnosis not present

## 2022-11-04 DIAGNOSIS — I6389 Other cerebral infarction: Secondary | ICD-10-CM | POA: Diagnosis not present

## 2022-11-04 DIAGNOSIS — G8111 Spastic hemiplegia affecting right dominant side: Secondary | ICD-10-CM | POA: Diagnosis not present

## 2022-11-09 DIAGNOSIS — R41841 Cognitive communication deficit: Secondary | ICD-10-CM | POA: Diagnosis not present

## 2022-11-09 DIAGNOSIS — I6389 Other cerebral infarction: Secondary | ICD-10-CM | POA: Diagnosis not present

## 2022-11-09 DIAGNOSIS — G8111 Spastic hemiplegia affecting right dominant side: Secondary | ICD-10-CM | POA: Diagnosis not present

## 2022-11-09 DIAGNOSIS — I69328 Other speech and language deficits following cerebral infarction: Secondary | ICD-10-CM | POA: Diagnosis not present

## 2022-11-11 DIAGNOSIS — R41841 Cognitive communication deficit: Secondary | ICD-10-CM | POA: Diagnosis not present

## 2022-11-11 DIAGNOSIS — G8111 Spastic hemiplegia affecting right dominant side: Secondary | ICD-10-CM | POA: Diagnosis not present

## 2022-11-11 DIAGNOSIS — I6389 Other cerebral infarction: Secondary | ICD-10-CM | POA: Diagnosis not present

## 2022-11-11 DIAGNOSIS — I69328 Other speech and language deficits following cerebral infarction: Secondary | ICD-10-CM | POA: Diagnosis not present

## 2022-11-16 DIAGNOSIS — R41841 Cognitive communication deficit: Secondary | ICD-10-CM | POA: Diagnosis not present

## 2022-11-16 DIAGNOSIS — I69328 Other speech and language deficits following cerebral infarction: Secondary | ICD-10-CM | POA: Diagnosis not present

## 2022-11-16 DIAGNOSIS — G8111 Spastic hemiplegia affecting right dominant side: Secondary | ICD-10-CM | POA: Diagnosis not present

## 2022-11-16 DIAGNOSIS — I6389 Other cerebral infarction: Secondary | ICD-10-CM | POA: Diagnosis not present

## 2022-11-18 DIAGNOSIS — I6389 Other cerebral infarction: Secondary | ICD-10-CM | POA: Diagnosis not present

## 2022-11-18 DIAGNOSIS — G8111 Spastic hemiplegia affecting right dominant side: Secondary | ICD-10-CM | POA: Diagnosis not present

## 2022-11-18 DIAGNOSIS — I69328 Other speech and language deficits following cerebral infarction: Secondary | ICD-10-CM | POA: Diagnosis not present

## 2022-11-18 DIAGNOSIS — R41841 Cognitive communication deficit: Secondary | ICD-10-CM | POA: Diagnosis not present

## 2022-11-30 DIAGNOSIS — I6389 Other cerebral infarction: Secondary | ICD-10-CM | POA: Diagnosis not present

## 2022-11-30 DIAGNOSIS — G8111 Spastic hemiplegia affecting right dominant side: Secondary | ICD-10-CM | POA: Diagnosis not present

## 2022-11-30 DIAGNOSIS — I69328 Other speech and language deficits following cerebral infarction: Secondary | ICD-10-CM | POA: Diagnosis not present

## 2022-11-30 DIAGNOSIS — R41841 Cognitive communication deficit: Secondary | ICD-10-CM | POA: Diagnosis not present

## 2022-12-03 DIAGNOSIS — I69328 Other speech and language deficits following cerebral infarction: Secondary | ICD-10-CM | POA: Diagnosis not present

## 2022-12-03 DIAGNOSIS — I6389 Other cerebral infarction: Secondary | ICD-10-CM | POA: Diagnosis not present

## 2022-12-03 DIAGNOSIS — R41841 Cognitive communication deficit: Secondary | ICD-10-CM | POA: Diagnosis not present

## 2022-12-03 DIAGNOSIS — G8111 Spastic hemiplegia affecting right dominant side: Secondary | ICD-10-CM | POA: Diagnosis not present

## 2022-12-07 DIAGNOSIS — R41841 Cognitive communication deficit: Secondary | ICD-10-CM | POA: Diagnosis not present

## 2022-12-07 DIAGNOSIS — I6389 Other cerebral infarction: Secondary | ICD-10-CM | POA: Diagnosis not present

## 2022-12-07 DIAGNOSIS — G8111 Spastic hemiplegia affecting right dominant side: Secondary | ICD-10-CM | POA: Diagnosis not present

## 2022-12-07 DIAGNOSIS — I69328 Other speech and language deficits following cerebral infarction: Secondary | ICD-10-CM | POA: Diagnosis not present

## 2022-12-09 DIAGNOSIS — I69328 Other speech and language deficits following cerebral infarction: Secondary | ICD-10-CM | POA: Diagnosis not present

## 2022-12-09 DIAGNOSIS — R41841 Cognitive communication deficit: Secondary | ICD-10-CM | POA: Diagnosis not present

## 2022-12-09 DIAGNOSIS — I6389 Other cerebral infarction: Secondary | ICD-10-CM | POA: Diagnosis not present

## 2022-12-09 DIAGNOSIS — G8111 Spastic hemiplegia affecting right dominant side: Secondary | ICD-10-CM | POA: Diagnosis not present

## 2022-12-14 DIAGNOSIS — I69328 Other speech and language deficits following cerebral infarction: Secondary | ICD-10-CM | POA: Diagnosis not present

## 2022-12-14 DIAGNOSIS — I6389 Other cerebral infarction: Secondary | ICD-10-CM | POA: Diagnosis not present

## 2022-12-14 DIAGNOSIS — G8111 Spastic hemiplegia affecting right dominant side: Secondary | ICD-10-CM | POA: Diagnosis not present

## 2022-12-14 DIAGNOSIS — R41841 Cognitive communication deficit: Secondary | ICD-10-CM | POA: Diagnosis not present

## 2022-12-16 DIAGNOSIS — I6389 Other cerebral infarction: Secondary | ICD-10-CM | POA: Diagnosis not present

## 2022-12-16 DIAGNOSIS — R41841 Cognitive communication deficit: Secondary | ICD-10-CM | POA: Diagnosis not present

## 2022-12-16 DIAGNOSIS — I69328 Other speech and language deficits following cerebral infarction: Secondary | ICD-10-CM | POA: Diagnosis not present

## 2022-12-16 DIAGNOSIS — G8111 Spastic hemiplegia affecting right dominant side: Secondary | ICD-10-CM | POA: Diagnosis not present

## 2022-12-21 DIAGNOSIS — I6389 Other cerebral infarction: Secondary | ICD-10-CM | POA: Diagnosis not present

## 2022-12-21 DIAGNOSIS — G8111 Spastic hemiplegia affecting right dominant side: Secondary | ICD-10-CM | POA: Diagnosis not present

## 2022-12-21 DIAGNOSIS — R41841 Cognitive communication deficit: Secondary | ICD-10-CM | POA: Diagnosis not present

## 2022-12-21 DIAGNOSIS — I69328 Other speech and language deficits following cerebral infarction: Secondary | ICD-10-CM | POA: Diagnosis not present

## 2022-12-22 ENCOUNTER — Encounter: Payer: Medicare Other | Admitting: Physical Medicine & Rehabilitation

## 2022-12-28 DIAGNOSIS — I6389 Other cerebral infarction: Secondary | ICD-10-CM | POA: Diagnosis not present

## 2022-12-28 DIAGNOSIS — R41841 Cognitive communication deficit: Secondary | ICD-10-CM | POA: Diagnosis not present

## 2022-12-28 DIAGNOSIS — I69328 Other speech and language deficits following cerebral infarction: Secondary | ICD-10-CM | POA: Diagnosis not present

## 2022-12-28 DIAGNOSIS — G8111 Spastic hemiplegia affecting right dominant side: Secondary | ICD-10-CM | POA: Diagnosis not present

## 2022-12-30 DIAGNOSIS — I69328 Other speech and language deficits following cerebral infarction: Secondary | ICD-10-CM | POA: Diagnosis not present

## 2022-12-30 DIAGNOSIS — G8111 Spastic hemiplegia affecting right dominant side: Secondary | ICD-10-CM | POA: Diagnosis not present

## 2022-12-30 DIAGNOSIS — I6389 Other cerebral infarction: Secondary | ICD-10-CM | POA: Diagnosis not present

## 2022-12-30 DIAGNOSIS — R41841 Cognitive communication deficit: Secondary | ICD-10-CM | POA: Diagnosis not present

## 2023-01-04 DIAGNOSIS — I69328 Other speech and language deficits following cerebral infarction: Secondary | ICD-10-CM | POA: Diagnosis not present

## 2023-01-04 DIAGNOSIS — G8111 Spastic hemiplegia affecting right dominant side: Secondary | ICD-10-CM | POA: Diagnosis not present

## 2023-01-04 DIAGNOSIS — R41841 Cognitive communication deficit: Secondary | ICD-10-CM | POA: Diagnosis not present

## 2023-01-04 DIAGNOSIS — I6389 Other cerebral infarction: Secondary | ICD-10-CM | POA: Diagnosis not present

## 2023-01-06 DIAGNOSIS — R41841 Cognitive communication deficit: Secondary | ICD-10-CM | POA: Diagnosis not present

## 2023-01-06 DIAGNOSIS — G8111 Spastic hemiplegia affecting right dominant side: Secondary | ICD-10-CM | POA: Diagnosis not present

## 2023-01-06 DIAGNOSIS — I6389 Other cerebral infarction: Secondary | ICD-10-CM | POA: Diagnosis not present

## 2023-01-06 DIAGNOSIS — I69328 Other speech and language deficits following cerebral infarction: Secondary | ICD-10-CM | POA: Diagnosis not present

## 2023-01-13 DIAGNOSIS — R41841 Cognitive communication deficit: Secondary | ICD-10-CM | POA: Diagnosis not present

## 2023-01-13 DIAGNOSIS — I69328 Other speech and language deficits following cerebral infarction: Secondary | ICD-10-CM | POA: Diagnosis not present

## 2023-01-13 DIAGNOSIS — I6389 Other cerebral infarction: Secondary | ICD-10-CM | POA: Diagnosis not present

## 2023-01-13 DIAGNOSIS — G8111 Spastic hemiplegia affecting right dominant side: Secondary | ICD-10-CM | POA: Diagnosis not present

## 2023-01-14 ENCOUNTER — Other Ambulatory Visit (HOSPITAL_COMMUNITY): Payer: Self-pay | Admitting: *Deleted

## 2023-01-14 DIAGNOSIS — G8111 Spastic hemiplegia affecting right dominant side: Secondary | ICD-10-CM | POA: Diagnosis not present

## 2023-01-14 DIAGNOSIS — I69328 Other speech and language deficits following cerebral infarction: Secondary | ICD-10-CM | POA: Diagnosis not present

## 2023-01-14 DIAGNOSIS — R41841 Cognitive communication deficit: Secondary | ICD-10-CM | POA: Diagnosis not present

## 2023-01-14 DIAGNOSIS — I6389 Other cerebral infarction: Secondary | ICD-10-CM | POA: Diagnosis not present

## 2023-01-17 ENCOUNTER — Encounter (HOSPITAL_COMMUNITY)
Admission: RE | Admit: 2023-01-17 | Discharge: 2023-01-17 | Disposition: A | Discharge status: Home / Self Care | Payer: Medicare Other | Source: Ambulatory Visit | Attending: Internal Medicine | Admitting: Internal Medicine

## 2023-01-17 DIAGNOSIS — M810 Age-related osteoporosis without current pathological fracture: Secondary | ICD-10-CM | POA: Diagnosis not present

## 2023-01-17 MED ORDER — DENOSUMAB 60 MG/ML ~~LOC~~ SOSY
PREFILLED_SYRINGE | SUBCUTANEOUS | Status: AC
  Filled 2023-01-17: qty 1

## 2023-01-17 MED ORDER — DENOSUMAB 60 MG/ML ~~LOC~~ SOSY
60.0000 mg | PREFILLED_SYRINGE | Freq: Once | SUBCUTANEOUS | Status: AC
  Administered 2023-01-17: 60 mg via SUBCUTANEOUS

## 2023-01-19 DIAGNOSIS — I69328 Other speech and language deficits following cerebral infarction: Secondary | ICD-10-CM | POA: Diagnosis not present

## 2023-01-19 DIAGNOSIS — R41841 Cognitive communication deficit: Secondary | ICD-10-CM | POA: Diagnosis not present

## 2023-01-19 DIAGNOSIS — G8111 Spastic hemiplegia affecting right dominant side: Secondary | ICD-10-CM | POA: Diagnosis not present

## 2023-01-19 DIAGNOSIS — I6389 Other cerebral infarction: Secondary | ICD-10-CM | POA: Diagnosis not present

## 2023-01-20 DIAGNOSIS — Z23 Encounter for immunization: Secondary | ICD-10-CM | POA: Diagnosis not present

## 2023-01-21 DIAGNOSIS — I6389 Other cerebral infarction: Secondary | ICD-10-CM | POA: Diagnosis not present

## 2023-01-21 DIAGNOSIS — G8111 Spastic hemiplegia affecting right dominant side: Secondary | ICD-10-CM | POA: Diagnosis not present

## 2023-01-21 DIAGNOSIS — I69328 Other speech and language deficits following cerebral infarction: Secondary | ICD-10-CM | POA: Diagnosis not present

## 2023-01-21 DIAGNOSIS — R41841 Cognitive communication deficit: Secondary | ICD-10-CM | POA: Diagnosis not present

## 2023-01-25 DIAGNOSIS — I6389 Other cerebral infarction: Secondary | ICD-10-CM | POA: Diagnosis not present

## 2023-01-25 DIAGNOSIS — G8111 Spastic hemiplegia affecting right dominant side: Secondary | ICD-10-CM | POA: Diagnosis not present

## 2023-01-25 DIAGNOSIS — R41841 Cognitive communication deficit: Secondary | ICD-10-CM | POA: Diagnosis not present

## 2023-01-25 DIAGNOSIS — I69328 Other speech and language deficits following cerebral infarction: Secondary | ICD-10-CM | POA: Diagnosis not present

## 2023-01-26 ENCOUNTER — Encounter: Payer: Medicare Other | Attending: Physical Medicine & Rehabilitation | Admitting: Physical Medicine & Rehabilitation

## 2023-01-26 ENCOUNTER — Encounter: Payer: Self-pay | Admitting: Physical Medicine & Rehabilitation

## 2023-01-26 VITALS — BP 124/85 | HR 66 | Ht 65.0 in | Wt 115.0 lb

## 2023-01-26 DIAGNOSIS — I69351 Hemiplegia and hemiparesis following cerebral infarction affecting right dominant side: Secondary | ICD-10-CM | POA: Diagnosis not present

## 2023-01-26 MED ORDER — ONABOTULINUMTOXINA 100 UNITS IJ SOLR
100.0000 [IU] | Freq: Once | INTRAMUSCULAR | Status: AC
  Administered 2023-01-26: 500 [IU] via INTRAMUSCULAR

## 2023-01-26 MED ORDER — SODIUM CHLORIDE (PF) 0.9 % IJ SOLN
10.0000 mL | Freq: Once | INTRAMUSCULAR | Status: AC
  Administered 2023-01-26: 10 mL via INTRAVENOUS

## 2023-01-26 NOTE — Progress Notes (Signed)
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 This includes bleeding, bruising, infection, excessive weakness, or medication side effects. A REMS form is on file and signed.  Needle: 50mm injectable monopolar needle electrode    Number of units per muscle Pectoralis Major 50 units Pectoralis Minor 100 units Teres major/minor 0 units Lats 0 units Biceps 250 units Brachioradialis 100 units FCR 0 units FCU 0 units FDS 0 units FDP 0 units FPL 0 units Palmaris Longus 0 units Pronator Teres 0 units Pronator Quadratus 0 units Lumbricals 0 units All injections were done after obtaining appropriate EMG activity and after negative drawback for blood. The patient tolerated the procedure well. Post procedure instructions were given. No follow-ups on file.

## 2023-01-26 NOTE — Patient Instructions (Signed)
ALWAYS FEEL FREE TO CALL OUR OFFICE WITH ANY PROBLEMS OR QUESTIONS ([phone])  **PLEASE NOTE** ALL MEDICATION REFILL REQUESTS (INCLUDING CONTROLLED SUBSTANCES) NEED TO BE MADE AT LEAST 7 DAYS PRIOR TO REFILL BEING DUE. ANY REFILL REQUESTS INSIDE THAT TIME FRAME MAY RESULT IN DELAYS IN RECEIVING YOUR PRESCRIPTION.                    

## 2023-01-28 DIAGNOSIS — I69328 Other speech and language deficits following cerebral infarction: Secondary | ICD-10-CM | POA: Diagnosis not present

## 2023-01-28 DIAGNOSIS — I6389 Other cerebral infarction: Secondary | ICD-10-CM | POA: Diagnosis not present

## 2023-01-28 DIAGNOSIS — R41841 Cognitive communication deficit: Secondary | ICD-10-CM | POA: Diagnosis not present

## 2023-01-28 DIAGNOSIS — G8111 Spastic hemiplegia affecting right dominant side: Secondary | ICD-10-CM | POA: Diagnosis not present

## 2023-02-02 DIAGNOSIS — G8111 Spastic hemiplegia affecting right dominant side: Secondary | ICD-10-CM | POA: Diagnosis not present

## 2023-02-02 DIAGNOSIS — R41841 Cognitive communication deficit: Secondary | ICD-10-CM | POA: Diagnosis not present

## 2023-02-02 DIAGNOSIS — I69328 Other speech and language deficits following cerebral infarction: Secondary | ICD-10-CM | POA: Diagnosis not present

## 2023-02-02 DIAGNOSIS — I6389 Other cerebral infarction: Secondary | ICD-10-CM | POA: Diagnosis not present

## 2023-02-04 DIAGNOSIS — G8111 Spastic hemiplegia affecting right dominant side: Secondary | ICD-10-CM | POA: Diagnosis not present

## 2023-02-04 DIAGNOSIS — I69328 Other speech and language deficits following cerebral infarction: Secondary | ICD-10-CM | POA: Diagnosis not present

## 2023-02-04 DIAGNOSIS — I6389 Other cerebral infarction: Secondary | ICD-10-CM | POA: Diagnosis not present

## 2023-02-04 DIAGNOSIS — R41841 Cognitive communication deficit: Secondary | ICD-10-CM | POA: Diagnosis not present

## 2023-02-08 DIAGNOSIS — G8111 Spastic hemiplegia affecting right dominant side: Secondary | ICD-10-CM | POA: Diagnosis not present

## 2023-02-08 DIAGNOSIS — R41841 Cognitive communication deficit: Secondary | ICD-10-CM | POA: Diagnosis not present

## 2023-02-08 DIAGNOSIS — I6389 Other cerebral infarction: Secondary | ICD-10-CM | POA: Diagnosis not present

## 2023-02-08 DIAGNOSIS — I69328 Other speech and language deficits following cerebral infarction: Secondary | ICD-10-CM | POA: Diagnosis not present

## 2023-02-10 DIAGNOSIS — G8111 Spastic hemiplegia affecting right dominant side: Secondary | ICD-10-CM | POA: Diagnosis not present

## 2023-02-10 DIAGNOSIS — I69328 Other speech and language deficits following cerebral infarction: Secondary | ICD-10-CM | POA: Diagnosis not present

## 2023-02-10 DIAGNOSIS — R41841 Cognitive communication deficit: Secondary | ICD-10-CM | POA: Diagnosis not present

## 2023-02-10 DIAGNOSIS — I6389 Other cerebral infarction: Secondary | ICD-10-CM | POA: Diagnosis not present

## 2023-02-15 DIAGNOSIS — G8111 Spastic hemiplegia affecting right dominant side: Secondary | ICD-10-CM | POA: Diagnosis not present

## 2023-02-15 DIAGNOSIS — I69328 Other speech and language deficits following cerebral infarction: Secondary | ICD-10-CM | POA: Diagnosis not present

## 2023-02-15 DIAGNOSIS — I6389 Other cerebral infarction: Secondary | ICD-10-CM | POA: Diagnosis not present

## 2023-02-15 DIAGNOSIS — R41841 Cognitive communication deficit: Secondary | ICD-10-CM | POA: Diagnosis not present

## 2023-02-18 DIAGNOSIS — R41841 Cognitive communication deficit: Secondary | ICD-10-CM | POA: Diagnosis not present

## 2023-02-18 DIAGNOSIS — R293 Abnormal posture: Secondary | ICD-10-CM | POA: Diagnosis not present

## 2023-02-18 DIAGNOSIS — G8111 Spastic hemiplegia affecting right dominant side: Secondary | ICD-10-CM | POA: Diagnosis not present

## 2023-02-18 DIAGNOSIS — M6389 Disorders of muscle in diseases classified elsewhere, multiple sites: Secondary | ICD-10-CM | POA: Diagnosis not present

## 2023-02-18 DIAGNOSIS — I6389 Other cerebral infarction: Secondary | ICD-10-CM | POA: Diagnosis not present

## 2023-02-18 DIAGNOSIS — R41840 Attention and concentration deficit: Secondary | ICD-10-CM | POA: Diagnosis not present

## 2023-02-18 DIAGNOSIS — R4189 Other symptoms and signs involving cognitive functions and awareness: Secondary | ICD-10-CM | POA: Diagnosis not present

## 2023-02-18 DIAGNOSIS — G8110 Spastic hemiplegia affecting unspecified side: Secondary | ICD-10-CM | POA: Diagnosis not present

## 2023-02-18 DIAGNOSIS — I69328 Other speech and language deficits following cerebral infarction: Secondary | ICD-10-CM | POA: Diagnosis not present

## 2023-02-18 DIAGNOSIS — R278 Other lack of coordination: Secondary | ICD-10-CM | POA: Diagnosis not present

## 2023-02-22 DIAGNOSIS — R293 Abnormal posture: Secondary | ICD-10-CM | POA: Diagnosis not present

## 2023-02-22 DIAGNOSIS — R41841 Cognitive communication deficit: Secondary | ICD-10-CM | POA: Diagnosis not present

## 2023-02-22 DIAGNOSIS — M6389 Disorders of muscle in diseases classified elsewhere, multiple sites: Secondary | ICD-10-CM | POA: Diagnosis not present

## 2023-02-22 DIAGNOSIS — I6389 Other cerebral infarction: Secondary | ICD-10-CM | POA: Diagnosis not present

## 2023-02-22 DIAGNOSIS — R278 Other lack of coordination: Secondary | ICD-10-CM | POA: Diagnosis not present

## 2023-02-22 DIAGNOSIS — I69328 Other speech and language deficits following cerebral infarction: Secondary | ICD-10-CM | POA: Diagnosis not present

## 2023-02-24 DIAGNOSIS — I6389 Other cerebral infarction: Secondary | ICD-10-CM | POA: Diagnosis not present

## 2023-02-24 DIAGNOSIS — M6389 Disorders of muscle in diseases classified elsewhere, multiple sites: Secondary | ICD-10-CM | POA: Diagnosis not present

## 2023-02-24 DIAGNOSIS — R41841 Cognitive communication deficit: Secondary | ICD-10-CM | POA: Diagnosis not present

## 2023-02-24 DIAGNOSIS — R278 Other lack of coordination: Secondary | ICD-10-CM | POA: Diagnosis not present

## 2023-02-24 DIAGNOSIS — R293 Abnormal posture: Secondary | ICD-10-CM | POA: Diagnosis not present

## 2023-02-24 DIAGNOSIS — I69328 Other speech and language deficits following cerebral infarction: Secondary | ICD-10-CM | POA: Diagnosis not present

## 2023-03-01 DIAGNOSIS — R293 Abnormal posture: Secondary | ICD-10-CM | POA: Diagnosis not present

## 2023-03-01 DIAGNOSIS — R41841 Cognitive communication deficit: Secondary | ICD-10-CM | POA: Diagnosis not present

## 2023-03-01 DIAGNOSIS — R278 Other lack of coordination: Secondary | ICD-10-CM | POA: Diagnosis not present

## 2023-03-01 DIAGNOSIS — M6389 Disorders of muscle in diseases classified elsewhere, multiple sites: Secondary | ICD-10-CM | POA: Diagnosis not present

## 2023-03-01 DIAGNOSIS — I6389 Other cerebral infarction: Secondary | ICD-10-CM | POA: Diagnosis not present

## 2023-03-01 DIAGNOSIS — I69328 Other speech and language deficits following cerebral infarction: Secondary | ICD-10-CM | POA: Diagnosis not present

## 2023-03-02 DIAGNOSIS — I69328 Other speech and language deficits following cerebral infarction: Secondary | ICD-10-CM | POA: Diagnosis not present

## 2023-03-02 DIAGNOSIS — R41841 Cognitive communication deficit: Secondary | ICD-10-CM | POA: Diagnosis not present

## 2023-03-02 DIAGNOSIS — R293 Abnormal posture: Secondary | ICD-10-CM | POA: Diagnosis not present

## 2023-03-02 DIAGNOSIS — I6389 Other cerebral infarction: Secondary | ICD-10-CM | POA: Diagnosis not present

## 2023-03-02 DIAGNOSIS — M6389 Disorders of muscle in diseases classified elsewhere, multiple sites: Secondary | ICD-10-CM | POA: Diagnosis not present

## 2023-03-02 DIAGNOSIS — R278 Other lack of coordination: Secondary | ICD-10-CM | POA: Diagnosis not present

## 2023-03-03 DIAGNOSIS — R41841 Cognitive communication deficit: Secondary | ICD-10-CM | POA: Diagnosis not present

## 2023-03-03 DIAGNOSIS — R293 Abnormal posture: Secondary | ICD-10-CM | POA: Diagnosis not present

## 2023-03-03 DIAGNOSIS — R278 Other lack of coordination: Secondary | ICD-10-CM | POA: Diagnosis not present

## 2023-03-03 DIAGNOSIS — I69328 Other speech and language deficits following cerebral infarction: Secondary | ICD-10-CM | POA: Diagnosis not present

## 2023-03-03 DIAGNOSIS — I6389 Other cerebral infarction: Secondary | ICD-10-CM | POA: Diagnosis not present

## 2023-03-03 DIAGNOSIS — M6389 Disorders of muscle in diseases classified elsewhere, multiple sites: Secondary | ICD-10-CM | POA: Diagnosis not present

## 2023-03-04 DIAGNOSIS — H401112 Primary open-angle glaucoma, right eye, moderate stage: Secondary | ICD-10-CM | POA: Diagnosis not present

## 2023-03-04 DIAGNOSIS — H401121 Primary open-angle glaucoma, left eye, mild stage: Secondary | ICD-10-CM | POA: Diagnosis not present

## 2023-03-09 DIAGNOSIS — R293 Abnormal posture: Secondary | ICD-10-CM | POA: Diagnosis not present

## 2023-03-09 DIAGNOSIS — R278 Other lack of coordination: Secondary | ICD-10-CM | POA: Diagnosis not present

## 2023-03-09 DIAGNOSIS — R41841 Cognitive communication deficit: Secondary | ICD-10-CM | POA: Diagnosis not present

## 2023-03-09 DIAGNOSIS — I6389 Other cerebral infarction: Secondary | ICD-10-CM | POA: Diagnosis not present

## 2023-03-09 DIAGNOSIS — M6389 Disorders of muscle in diseases classified elsewhere, multiple sites: Secondary | ICD-10-CM | POA: Diagnosis not present

## 2023-03-09 DIAGNOSIS — I69328 Other speech and language deficits following cerebral infarction: Secondary | ICD-10-CM | POA: Diagnosis not present

## 2023-03-11 DIAGNOSIS — R278 Other lack of coordination: Secondary | ICD-10-CM | POA: Diagnosis not present

## 2023-03-11 DIAGNOSIS — M6389 Disorders of muscle in diseases classified elsewhere, multiple sites: Secondary | ICD-10-CM | POA: Diagnosis not present

## 2023-03-11 DIAGNOSIS — I6389 Other cerebral infarction: Secondary | ICD-10-CM | POA: Diagnosis not present

## 2023-03-11 DIAGNOSIS — R41841 Cognitive communication deficit: Secondary | ICD-10-CM | POA: Diagnosis not present

## 2023-03-11 DIAGNOSIS — R293 Abnormal posture: Secondary | ICD-10-CM | POA: Diagnosis not present

## 2023-03-11 DIAGNOSIS — I69328 Other speech and language deficits following cerebral infarction: Secondary | ICD-10-CM | POA: Diagnosis not present

## 2023-03-16 DIAGNOSIS — R278 Other lack of coordination: Secondary | ICD-10-CM | POA: Diagnosis not present

## 2023-03-16 DIAGNOSIS — M6389 Disorders of muscle in diseases classified elsewhere, multiple sites: Secondary | ICD-10-CM | POA: Diagnosis not present

## 2023-03-16 DIAGNOSIS — R41841 Cognitive communication deficit: Secondary | ICD-10-CM | POA: Diagnosis not present

## 2023-03-16 DIAGNOSIS — I69328 Other speech and language deficits following cerebral infarction: Secondary | ICD-10-CM | POA: Diagnosis not present

## 2023-03-16 DIAGNOSIS — I6389 Other cerebral infarction: Secondary | ICD-10-CM | POA: Diagnosis not present

## 2023-03-16 DIAGNOSIS — R293 Abnormal posture: Secondary | ICD-10-CM | POA: Diagnosis not present

## 2023-03-21 DIAGNOSIS — R946 Abnormal results of thyroid function studies: Secondary | ICD-10-CM | POA: Diagnosis not present

## 2023-03-21 DIAGNOSIS — E785 Hyperlipidemia, unspecified: Secondary | ICD-10-CM | POA: Diagnosis not present

## 2023-03-21 DIAGNOSIS — I10 Essential (primary) hypertension: Secondary | ICD-10-CM | POA: Diagnosis not present

## 2023-03-21 DIAGNOSIS — E559 Vitamin D deficiency, unspecified: Secondary | ICD-10-CM | POA: Diagnosis not present

## 2023-03-21 DIAGNOSIS — Z79899 Other long term (current) drug therapy: Secondary | ICD-10-CM | POA: Diagnosis not present

## 2023-03-22 DIAGNOSIS — I6389 Other cerebral infarction: Secondary | ICD-10-CM | POA: Diagnosis not present

## 2023-03-22 DIAGNOSIS — I69328 Other speech and language deficits following cerebral infarction: Secondary | ICD-10-CM | POA: Diagnosis not present

## 2023-03-22 DIAGNOSIS — R278 Other lack of coordination: Secondary | ICD-10-CM | POA: Diagnosis not present

## 2023-03-22 DIAGNOSIS — M6389 Disorders of muscle in diseases classified elsewhere, multiple sites: Secondary | ICD-10-CM | POA: Diagnosis not present

## 2023-03-22 DIAGNOSIS — G8110 Spastic hemiplegia affecting unspecified side: Secondary | ICD-10-CM | POA: Diagnosis not present

## 2023-03-22 DIAGNOSIS — G8111 Spastic hemiplegia affecting right dominant side: Secondary | ICD-10-CM | POA: Diagnosis not present

## 2023-03-22 DIAGNOSIS — R4189 Other symptoms and signs involving cognitive functions and awareness: Secondary | ICD-10-CM | POA: Diagnosis not present

## 2023-03-22 DIAGNOSIS — R41840 Attention and concentration deficit: Secondary | ICD-10-CM | POA: Diagnosis not present

## 2023-03-22 DIAGNOSIS — R41841 Cognitive communication deficit: Secondary | ICD-10-CM | POA: Diagnosis not present

## 2023-03-22 DIAGNOSIS — R293 Abnormal posture: Secondary | ICD-10-CM | POA: Diagnosis not present

## 2023-03-23 DIAGNOSIS — R41841 Cognitive communication deficit: Secondary | ICD-10-CM | POA: Diagnosis not present

## 2023-03-23 DIAGNOSIS — M6389 Disorders of muscle in diseases classified elsewhere, multiple sites: Secondary | ICD-10-CM | POA: Diagnosis not present

## 2023-03-23 DIAGNOSIS — I6389 Other cerebral infarction: Secondary | ICD-10-CM | POA: Diagnosis not present

## 2023-03-23 DIAGNOSIS — I69328 Other speech and language deficits following cerebral infarction: Secondary | ICD-10-CM | POA: Diagnosis not present

## 2023-03-23 DIAGNOSIS — R278 Other lack of coordination: Secondary | ICD-10-CM | POA: Diagnosis not present

## 2023-03-23 DIAGNOSIS — R293 Abnormal posture: Secondary | ICD-10-CM | POA: Diagnosis not present

## 2023-03-25 DIAGNOSIS — R41841 Cognitive communication deficit: Secondary | ICD-10-CM | POA: Diagnosis not present

## 2023-03-25 DIAGNOSIS — M6389 Disorders of muscle in diseases classified elsewhere, multiple sites: Secondary | ICD-10-CM | POA: Diagnosis not present

## 2023-03-25 DIAGNOSIS — I6389 Other cerebral infarction: Secondary | ICD-10-CM | POA: Diagnosis not present

## 2023-03-25 DIAGNOSIS — R278 Other lack of coordination: Secondary | ICD-10-CM | POA: Diagnosis not present

## 2023-03-25 DIAGNOSIS — R293 Abnormal posture: Secondary | ICD-10-CM | POA: Diagnosis not present

## 2023-03-25 DIAGNOSIS — I69328 Other speech and language deficits following cerebral infarction: Secondary | ICD-10-CM | POA: Diagnosis not present

## 2023-03-28 DIAGNOSIS — I340 Nonrheumatic mitral (valve) insufficiency: Secondary | ICD-10-CM | POA: Diagnosis not present

## 2023-03-28 DIAGNOSIS — I517 Cardiomegaly: Secondary | ICD-10-CM | POA: Diagnosis not present

## 2023-03-28 DIAGNOSIS — I350 Nonrheumatic aortic (valve) stenosis: Secondary | ICD-10-CM | POA: Diagnosis not present

## 2023-03-28 DIAGNOSIS — I69359 Hemiplegia and hemiparesis following cerebral infarction affecting unspecified side: Secondary | ICD-10-CM | POA: Diagnosis not present

## 2023-03-28 DIAGNOSIS — E785 Hyperlipidemia, unspecified: Secondary | ICD-10-CM | POA: Diagnosis not present

## 2023-03-28 DIAGNOSIS — I69320 Aphasia following cerebral infarction: Secondary | ICD-10-CM | POA: Diagnosis not present

## 2023-03-28 DIAGNOSIS — M8580 Other specified disorders of bone density and structure, unspecified site: Secondary | ICD-10-CM | POA: Diagnosis not present

## 2023-03-28 DIAGNOSIS — B009 Herpesviral infection, unspecified: Secondary | ICD-10-CM | POA: Diagnosis not present

## 2023-03-28 DIAGNOSIS — I119 Hypertensive heart disease without heart failure: Secondary | ICD-10-CM | POA: Diagnosis not present

## 2023-03-28 DIAGNOSIS — J45909 Unspecified asthma, uncomplicated: Secondary | ICD-10-CM | POA: Diagnosis not present

## 2023-03-28 DIAGNOSIS — Z0000 Encounter for general adult medical examination without abnormal findings: Secondary | ICD-10-CM | POA: Diagnosis not present

## 2023-03-28 DIAGNOSIS — R809 Proteinuria, unspecified: Secondary | ICD-10-CM | POA: Diagnosis not present

## 2023-03-29 DIAGNOSIS — R293 Abnormal posture: Secondary | ICD-10-CM | POA: Diagnosis not present

## 2023-03-29 DIAGNOSIS — M6389 Disorders of muscle in diseases classified elsewhere, multiple sites: Secondary | ICD-10-CM | POA: Diagnosis not present

## 2023-03-29 DIAGNOSIS — I6389 Other cerebral infarction: Secondary | ICD-10-CM | POA: Diagnosis not present

## 2023-03-29 DIAGNOSIS — R278 Other lack of coordination: Secondary | ICD-10-CM | POA: Diagnosis not present

## 2023-03-29 DIAGNOSIS — I69328 Other speech and language deficits following cerebral infarction: Secondary | ICD-10-CM | POA: Diagnosis not present

## 2023-03-29 DIAGNOSIS — R41841 Cognitive communication deficit: Secondary | ICD-10-CM | POA: Diagnosis not present

## 2023-04-01 DIAGNOSIS — R41841 Cognitive communication deficit: Secondary | ICD-10-CM | POA: Diagnosis not present

## 2023-04-01 DIAGNOSIS — R278 Other lack of coordination: Secondary | ICD-10-CM | POA: Diagnosis not present

## 2023-04-01 DIAGNOSIS — R293 Abnormal posture: Secondary | ICD-10-CM | POA: Diagnosis not present

## 2023-04-01 DIAGNOSIS — I6389 Other cerebral infarction: Secondary | ICD-10-CM | POA: Diagnosis not present

## 2023-04-01 DIAGNOSIS — I69328 Other speech and language deficits following cerebral infarction: Secondary | ICD-10-CM | POA: Diagnosis not present

## 2023-04-01 DIAGNOSIS — M6389 Disorders of muscle in diseases classified elsewhere, multiple sites: Secondary | ICD-10-CM | POA: Diagnosis not present

## 2023-04-05 DIAGNOSIS — R278 Other lack of coordination: Secondary | ICD-10-CM | POA: Diagnosis not present

## 2023-04-05 DIAGNOSIS — M6389 Disorders of muscle in diseases classified elsewhere, multiple sites: Secondary | ICD-10-CM | POA: Diagnosis not present

## 2023-04-05 DIAGNOSIS — I69328 Other speech and language deficits following cerebral infarction: Secondary | ICD-10-CM | POA: Diagnosis not present

## 2023-04-05 DIAGNOSIS — I6389 Other cerebral infarction: Secondary | ICD-10-CM | POA: Diagnosis not present

## 2023-04-05 DIAGNOSIS — R41841 Cognitive communication deficit: Secondary | ICD-10-CM | POA: Diagnosis not present

## 2023-04-05 DIAGNOSIS — R293 Abnormal posture: Secondary | ICD-10-CM | POA: Diagnosis not present

## 2023-04-08 DIAGNOSIS — R293 Abnormal posture: Secondary | ICD-10-CM | POA: Diagnosis not present

## 2023-04-08 DIAGNOSIS — I69328 Other speech and language deficits following cerebral infarction: Secondary | ICD-10-CM | POA: Diagnosis not present

## 2023-04-08 DIAGNOSIS — M6389 Disorders of muscle in diseases classified elsewhere, multiple sites: Secondary | ICD-10-CM | POA: Diagnosis not present

## 2023-04-08 DIAGNOSIS — R41841 Cognitive communication deficit: Secondary | ICD-10-CM | POA: Diagnosis not present

## 2023-04-08 DIAGNOSIS — R278 Other lack of coordination: Secondary | ICD-10-CM | POA: Diagnosis not present

## 2023-04-08 DIAGNOSIS — I6389 Other cerebral infarction: Secondary | ICD-10-CM | POA: Diagnosis not present

## 2023-04-19 DIAGNOSIS — I6389 Other cerebral infarction: Secondary | ICD-10-CM | POA: Diagnosis not present

## 2023-04-19 DIAGNOSIS — M6389 Disorders of muscle in diseases classified elsewhere, multiple sites: Secondary | ICD-10-CM | POA: Diagnosis not present

## 2023-04-19 DIAGNOSIS — R278 Other lack of coordination: Secondary | ICD-10-CM | POA: Diagnosis not present

## 2023-04-19 DIAGNOSIS — I69328 Other speech and language deficits following cerebral infarction: Secondary | ICD-10-CM | POA: Diagnosis not present

## 2023-04-19 DIAGNOSIS — R293 Abnormal posture: Secondary | ICD-10-CM | POA: Diagnosis not present

## 2023-04-19 DIAGNOSIS — R41841 Cognitive communication deficit: Secondary | ICD-10-CM | POA: Diagnosis not present

## 2023-04-21 DIAGNOSIS — R293 Abnormal posture: Secondary | ICD-10-CM | POA: Diagnosis not present

## 2023-04-21 DIAGNOSIS — G8111 Spastic hemiplegia affecting right dominant side: Secondary | ICD-10-CM | POA: Diagnosis not present

## 2023-04-21 DIAGNOSIS — I69328 Other speech and language deficits following cerebral infarction: Secondary | ICD-10-CM | POA: Diagnosis not present

## 2023-04-21 DIAGNOSIS — I6389 Other cerebral infarction: Secondary | ICD-10-CM | POA: Diagnosis not present

## 2023-04-21 DIAGNOSIS — R41840 Attention and concentration deficit: Secondary | ICD-10-CM | POA: Diagnosis not present

## 2023-04-21 DIAGNOSIS — R4189 Other symptoms and signs involving cognitive functions and awareness: Secondary | ICD-10-CM | POA: Diagnosis not present

## 2023-04-21 DIAGNOSIS — R41841 Cognitive communication deficit: Secondary | ICD-10-CM | POA: Diagnosis not present

## 2023-04-21 DIAGNOSIS — M6389 Disorders of muscle in diseases classified elsewhere, multiple sites: Secondary | ICD-10-CM | POA: Diagnosis not present

## 2023-04-21 DIAGNOSIS — R278 Other lack of coordination: Secondary | ICD-10-CM | POA: Diagnosis not present

## 2023-04-21 DIAGNOSIS — G8110 Spastic hemiplegia affecting unspecified side: Secondary | ICD-10-CM | POA: Diagnosis not present

## 2023-04-26 DIAGNOSIS — M6389 Disorders of muscle in diseases classified elsewhere, multiple sites: Secondary | ICD-10-CM | POA: Diagnosis not present

## 2023-04-26 DIAGNOSIS — I6389 Other cerebral infarction: Secondary | ICD-10-CM | POA: Diagnosis not present

## 2023-04-26 DIAGNOSIS — R41841 Cognitive communication deficit: Secondary | ICD-10-CM | POA: Diagnosis not present

## 2023-04-26 DIAGNOSIS — R278 Other lack of coordination: Secondary | ICD-10-CM | POA: Diagnosis not present

## 2023-04-26 DIAGNOSIS — I69328 Other speech and language deficits following cerebral infarction: Secondary | ICD-10-CM | POA: Diagnosis not present

## 2023-04-26 DIAGNOSIS — R293 Abnormal posture: Secondary | ICD-10-CM | POA: Diagnosis not present

## 2023-04-28 DIAGNOSIS — R278 Other lack of coordination: Secondary | ICD-10-CM | POA: Diagnosis not present

## 2023-04-28 DIAGNOSIS — R293 Abnormal posture: Secondary | ICD-10-CM | POA: Diagnosis not present

## 2023-04-28 DIAGNOSIS — I69328 Other speech and language deficits following cerebral infarction: Secondary | ICD-10-CM | POA: Diagnosis not present

## 2023-04-28 DIAGNOSIS — M6389 Disorders of muscle in diseases classified elsewhere, multiple sites: Secondary | ICD-10-CM | POA: Diagnosis not present

## 2023-04-28 DIAGNOSIS — R41841 Cognitive communication deficit: Secondary | ICD-10-CM | POA: Diagnosis not present

## 2023-04-28 DIAGNOSIS — I6389 Other cerebral infarction: Secondary | ICD-10-CM | POA: Diagnosis not present

## 2023-05-02 DIAGNOSIS — R41841 Cognitive communication deficit: Secondary | ICD-10-CM | POA: Diagnosis not present

## 2023-05-02 DIAGNOSIS — M6389 Disorders of muscle in diseases classified elsewhere, multiple sites: Secondary | ICD-10-CM | POA: Diagnosis not present

## 2023-05-02 DIAGNOSIS — I6389 Other cerebral infarction: Secondary | ICD-10-CM | POA: Diagnosis not present

## 2023-05-02 DIAGNOSIS — R278 Other lack of coordination: Secondary | ICD-10-CM | POA: Diagnosis not present

## 2023-05-02 DIAGNOSIS — R293 Abnormal posture: Secondary | ICD-10-CM | POA: Diagnosis not present

## 2023-05-02 DIAGNOSIS — I69328 Other speech and language deficits following cerebral infarction: Secondary | ICD-10-CM | POA: Diagnosis not present

## 2023-05-04 ENCOUNTER — Encounter: Payer: Self-pay | Admitting: Physical Medicine & Rehabilitation

## 2023-05-04 ENCOUNTER — Encounter: Payer: Medicare Other | Attending: Physical Medicine & Rehabilitation | Admitting: Physical Medicine & Rehabilitation

## 2023-05-04 VITALS — BP 139/78 | HR 73 | Ht 65.0 in | Wt 114.0 lb

## 2023-05-04 DIAGNOSIS — I69351 Hemiplegia and hemiparesis following cerebral infarction affecting right dominant side: Secondary | ICD-10-CM

## 2023-05-04 MED ORDER — SODIUM CHLORIDE (PF) 0.9 % IJ SOLN: 3.0000 mL | INTRAMUSCULAR | Status: AC | PRN

## 2023-05-04 MED ORDER — ONABOTULINUMTOXINA 100 UNITS IJ SOLR
500.0000 [IU] | Freq: Once | INTRAMUSCULAR | Status: AC
  Administered 2023-05-04: 500 [IU] via INTRAMUSCULAR

## 2023-05-04 NOTE — Patient Instructions (Signed)
 ALWAYS FEEL FREE TO CALL OUR OFFICE WITH ANY PROBLEMS OR QUESTIONS [phone])  **PLEASE NOTE** ALL MEDICATION REFILL REQUESTS (INCLUDING CONTROLLED SUBSTANCES) NEED TO BE MADE AT LEAST 7 DAYS PRIOR TO REFILL BEING DUE. ANY REFILL REQUESTS INSIDE THAT TIME FRAME MAY RESULT IN DELAYS IN RECEIVING YOUR PRESCRIPTION.     !!!!!HAPPY NEW YEAR!!!!!

## 2023-05-04 NOTE — Progress Notes (Signed)
 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 This includes bleeding, bruising, infection, excessive weakness, or medication side effects. A REMS form is on file and signed.  Needle: 50mm injectable monopolar needle electrode    Number of units per muscle Pectoralis Major 0 units Pectoralis Minor 50 units Biceps 150 units Brachioradialis 50 units FCR 25 units FCU 25 units FDS 75 units FDP 75 units FPL 0 units Palmaris Longus 0 units Pronator Teres 50 units Pronator Quadratus 0 units Lumbricals 0 units All injections were done after obtaining appropriate EMG activity and after negative drawback for blood. The patient tolerated the procedure well. Post procedure instructions were given. Return in about 3 months (around 08/02/2023) for 500 u botox  RUE.

## 2023-05-05 DIAGNOSIS — M6389 Disorders of muscle in diseases classified elsewhere, multiple sites: Secondary | ICD-10-CM | POA: Diagnosis not present

## 2023-05-05 DIAGNOSIS — R41841 Cognitive communication deficit: Secondary | ICD-10-CM | POA: Diagnosis not present

## 2023-05-05 DIAGNOSIS — R293 Abnormal posture: Secondary | ICD-10-CM | POA: Diagnosis not present

## 2023-05-05 DIAGNOSIS — I6389 Other cerebral infarction: Secondary | ICD-10-CM | POA: Diagnosis not present

## 2023-05-05 DIAGNOSIS — R278 Other lack of coordination: Secondary | ICD-10-CM | POA: Diagnosis not present

## 2023-05-05 DIAGNOSIS — I69328 Other speech and language deficits following cerebral infarction: Secondary | ICD-10-CM | POA: Diagnosis not present

## 2023-05-10 DIAGNOSIS — I6389 Other cerebral infarction: Secondary | ICD-10-CM | POA: Diagnosis not present

## 2023-05-10 DIAGNOSIS — M6389 Disorders of muscle in diseases classified elsewhere, multiple sites: Secondary | ICD-10-CM | POA: Diagnosis not present

## 2023-05-10 DIAGNOSIS — R41841 Cognitive communication deficit: Secondary | ICD-10-CM | POA: Diagnosis not present

## 2023-05-10 DIAGNOSIS — R278 Other lack of coordination: Secondary | ICD-10-CM | POA: Diagnosis not present

## 2023-05-10 DIAGNOSIS — R293 Abnormal posture: Secondary | ICD-10-CM | POA: Diagnosis not present

## 2023-05-10 DIAGNOSIS — I69328 Other speech and language deficits following cerebral infarction: Secondary | ICD-10-CM | POA: Diagnosis not present

## 2023-05-11 DIAGNOSIS — I6389 Other cerebral infarction: Secondary | ICD-10-CM | POA: Diagnosis not present

## 2023-05-11 DIAGNOSIS — I69328 Other speech and language deficits following cerebral infarction: Secondary | ICD-10-CM | POA: Diagnosis not present

## 2023-05-11 DIAGNOSIS — M6389 Disorders of muscle in diseases classified elsewhere, multiple sites: Secondary | ICD-10-CM | POA: Diagnosis not present

## 2023-05-11 DIAGNOSIS — R41841 Cognitive communication deficit: Secondary | ICD-10-CM | POA: Diagnosis not present

## 2023-05-11 DIAGNOSIS — R293 Abnormal posture: Secondary | ICD-10-CM | POA: Diagnosis not present

## 2023-05-11 DIAGNOSIS — R278 Other lack of coordination: Secondary | ICD-10-CM | POA: Diagnosis not present

## 2023-05-12 DIAGNOSIS — R278 Other lack of coordination: Secondary | ICD-10-CM | POA: Diagnosis not present

## 2023-05-12 DIAGNOSIS — M6389 Disorders of muscle in diseases classified elsewhere, multiple sites: Secondary | ICD-10-CM | POA: Diagnosis not present

## 2023-05-12 DIAGNOSIS — I69328 Other speech and language deficits following cerebral infarction: Secondary | ICD-10-CM | POA: Diagnosis not present

## 2023-05-12 DIAGNOSIS — R41841 Cognitive communication deficit: Secondary | ICD-10-CM | POA: Diagnosis not present

## 2023-05-12 DIAGNOSIS — I6389 Other cerebral infarction: Secondary | ICD-10-CM | POA: Diagnosis not present

## 2023-05-12 DIAGNOSIS — R293 Abnormal posture: Secondary | ICD-10-CM | POA: Diagnosis not present

## 2023-05-16 DIAGNOSIS — M6389 Disorders of muscle in diseases classified elsewhere, multiple sites: Secondary | ICD-10-CM | POA: Diagnosis not present

## 2023-05-16 DIAGNOSIS — I6389 Other cerebral infarction: Secondary | ICD-10-CM | POA: Diagnosis not present

## 2023-05-16 DIAGNOSIS — R41841 Cognitive communication deficit: Secondary | ICD-10-CM | POA: Diagnosis not present

## 2023-05-16 DIAGNOSIS — R278 Other lack of coordination: Secondary | ICD-10-CM | POA: Diagnosis not present

## 2023-05-16 DIAGNOSIS — R293 Abnormal posture: Secondary | ICD-10-CM | POA: Diagnosis not present

## 2023-05-16 DIAGNOSIS — I69328 Other speech and language deficits following cerebral infarction: Secondary | ICD-10-CM | POA: Diagnosis not present

## 2023-05-19 DIAGNOSIS — R278 Other lack of coordination: Secondary | ICD-10-CM | POA: Diagnosis not present

## 2023-05-19 DIAGNOSIS — I6389 Other cerebral infarction: Secondary | ICD-10-CM | POA: Diagnosis not present

## 2023-05-19 DIAGNOSIS — R41841 Cognitive communication deficit: Secondary | ICD-10-CM | POA: Diagnosis not present

## 2023-05-19 DIAGNOSIS — M6389 Disorders of muscle in diseases classified elsewhere, multiple sites: Secondary | ICD-10-CM | POA: Diagnosis not present

## 2023-05-19 DIAGNOSIS — I69328 Other speech and language deficits following cerebral infarction: Secondary | ICD-10-CM | POA: Diagnosis not present

## 2023-05-19 DIAGNOSIS — R293 Abnormal posture: Secondary | ICD-10-CM | POA: Diagnosis not present

## 2023-05-20 DIAGNOSIS — R41841 Cognitive communication deficit: Secondary | ICD-10-CM | POA: Diagnosis not present

## 2023-05-20 DIAGNOSIS — M6389 Disorders of muscle in diseases classified elsewhere, multiple sites: Secondary | ICD-10-CM | POA: Diagnosis not present

## 2023-05-20 DIAGNOSIS — I6389 Other cerebral infarction: Secondary | ICD-10-CM | POA: Diagnosis not present

## 2023-05-20 DIAGNOSIS — R278 Other lack of coordination: Secondary | ICD-10-CM | POA: Diagnosis not present

## 2023-05-20 DIAGNOSIS — I69328 Other speech and language deficits following cerebral infarction: Secondary | ICD-10-CM | POA: Diagnosis not present

## 2023-05-20 DIAGNOSIS — R293 Abnormal posture: Secondary | ICD-10-CM | POA: Diagnosis not present

## 2023-05-23 DIAGNOSIS — G8110 Spastic hemiplegia affecting unspecified side: Secondary | ICD-10-CM | POA: Diagnosis not present

## 2023-05-23 DIAGNOSIS — M6389 Disorders of muscle in diseases classified elsewhere, multiple sites: Secondary | ICD-10-CM | POA: Diagnosis not present

## 2023-05-23 DIAGNOSIS — R41841 Cognitive communication deficit: Secondary | ICD-10-CM | POA: Diagnosis not present

## 2023-05-23 DIAGNOSIS — R293 Abnormal posture: Secondary | ICD-10-CM | POA: Diagnosis not present

## 2023-05-23 DIAGNOSIS — R41840 Attention and concentration deficit: Secondary | ICD-10-CM | POA: Diagnosis not present

## 2023-05-23 DIAGNOSIS — I69328 Other speech and language deficits following cerebral infarction: Secondary | ICD-10-CM | POA: Diagnosis not present

## 2023-05-23 DIAGNOSIS — I6389 Other cerebral infarction: Secondary | ICD-10-CM | POA: Diagnosis not present

## 2023-05-23 DIAGNOSIS — R278 Other lack of coordination: Secondary | ICD-10-CM | POA: Diagnosis not present

## 2023-05-23 DIAGNOSIS — G8111 Spastic hemiplegia affecting right dominant side: Secondary | ICD-10-CM | POA: Diagnosis not present

## 2023-05-23 DIAGNOSIS — R4189 Other symptoms and signs involving cognitive functions and awareness: Secondary | ICD-10-CM | POA: Diagnosis not present

## 2023-05-25 DIAGNOSIS — R293 Abnormal posture: Secondary | ICD-10-CM | POA: Diagnosis not present

## 2023-05-25 DIAGNOSIS — R41841 Cognitive communication deficit: Secondary | ICD-10-CM | POA: Diagnosis not present

## 2023-05-25 DIAGNOSIS — R278 Other lack of coordination: Secondary | ICD-10-CM | POA: Diagnosis not present

## 2023-05-25 DIAGNOSIS — I69328 Other speech and language deficits following cerebral infarction: Secondary | ICD-10-CM | POA: Diagnosis not present

## 2023-05-25 DIAGNOSIS — M6389 Disorders of muscle in diseases classified elsewhere, multiple sites: Secondary | ICD-10-CM | POA: Diagnosis not present

## 2023-05-25 DIAGNOSIS — I6389 Other cerebral infarction: Secondary | ICD-10-CM | POA: Diagnosis not present

## 2023-05-27 DIAGNOSIS — R278 Other lack of coordination: Secondary | ICD-10-CM | POA: Diagnosis not present

## 2023-05-27 DIAGNOSIS — M6389 Disorders of muscle in diseases classified elsewhere, multiple sites: Secondary | ICD-10-CM | POA: Diagnosis not present

## 2023-05-27 DIAGNOSIS — I69328 Other speech and language deficits following cerebral infarction: Secondary | ICD-10-CM | POA: Diagnosis not present

## 2023-05-27 DIAGNOSIS — R41841 Cognitive communication deficit: Secondary | ICD-10-CM | POA: Diagnosis not present

## 2023-05-27 DIAGNOSIS — R293 Abnormal posture: Secondary | ICD-10-CM | POA: Diagnosis not present

## 2023-05-27 DIAGNOSIS — I6389 Other cerebral infarction: Secondary | ICD-10-CM | POA: Diagnosis not present

## 2023-05-31 DIAGNOSIS — M6389 Disorders of muscle in diseases classified elsewhere, multiple sites: Secondary | ICD-10-CM | POA: Diagnosis not present

## 2023-05-31 DIAGNOSIS — I69328 Other speech and language deficits following cerebral infarction: Secondary | ICD-10-CM | POA: Diagnosis not present

## 2023-05-31 DIAGNOSIS — R293 Abnormal posture: Secondary | ICD-10-CM | POA: Diagnosis not present

## 2023-05-31 DIAGNOSIS — I6389 Other cerebral infarction: Secondary | ICD-10-CM | POA: Diagnosis not present

## 2023-05-31 DIAGNOSIS — R41841 Cognitive communication deficit: Secondary | ICD-10-CM | POA: Diagnosis not present

## 2023-05-31 DIAGNOSIS — R278 Other lack of coordination: Secondary | ICD-10-CM | POA: Diagnosis not present

## 2023-06-02 DIAGNOSIS — I69328 Other speech and language deficits following cerebral infarction: Secondary | ICD-10-CM | POA: Diagnosis not present

## 2023-06-02 DIAGNOSIS — M6389 Disorders of muscle in diseases classified elsewhere, multiple sites: Secondary | ICD-10-CM | POA: Diagnosis not present

## 2023-06-02 DIAGNOSIS — R41841 Cognitive communication deficit: Secondary | ICD-10-CM | POA: Diagnosis not present

## 2023-06-02 DIAGNOSIS — R293 Abnormal posture: Secondary | ICD-10-CM | POA: Diagnosis not present

## 2023-06-02 DIAGNOSIS — R278 Other lack of coordination: Secondary | ICD-10-CM | POA: Diagnosis not present

## 2023-06-02 DIAGNOSIS — I6389 Other cerebral infarction: Secondary | ICD-10-CM | POA: Diagnosis not present

## 2023-06-03 DIAGNOSIS — R41841 Cognitive communication deficit: Secondary | ICD-10-CM | POA: Diagnosis not present

## 2023-06-03 DIAGNOSIS — I6389 Other cerebral infarction: Secondary | ICD-10-CM | POA: Diagnosis not present

## 2023-06-03 DIAGNOSIS — R278 Other lack of coordination: Secondary | ICD-10-CM | POA: Diagnosis not present

## 2023-06-03 DIAGNOSIS — I69328 Other speech and language deficits following cerebral infarction: Secondary | ICD-10-CM | POA: Diagnosis not present

## 2023-06-03 DIAGNOSIS — R293 Abnormal posture: Secondary | ICD-10-CM | POA: Diagnosis not present

## 2023-06-03 DIAGNOSIS — M6389 Disorders of muscle in diseases classified elsewhere, multiple sites: Secondary | ICD-10-CM | POA: Diagnosis not present

## 2023-06-06 DIAGNOSIS — I6389 Other cerebral infarction: Secondary | ICD-10-CM | POA: Diagnosis not present

## 2023-06-06 DIAGNOSIS — R278 Other lack of coordination: Secondary | ICD-10-CM | POA: Diagnosis not present

## 2023-06-06 DIAGNOSIS — M6389 Disorders of muscle in diseases classified elsewhere, multiple sites: Secondary | ICD-10-CM | POA: Diagnosis not present

## 2023-06-06 DIAGNOSIS — I69328 Other speech and language deficits following cerebral infarction: Secondary | ICD-10-CM | POA: Diagnosis not present

## 2023-06-06 DIAGNOSIS — R41841 Cognitive communication deficit: Secondary | ICD-10-CM | POA: Diagnosis not present

## 2023-06-06 DIAGNOSIS — R293 Abnormal posture: Secondary | ICD-10-CM | POA: Diagnosis not present

## 2023-06-09 DIAGNOSIS — I69328 Other speech and language deficits following cerebral infarction: Secondary | ICD-10-CM | POA: Diagnosis not present

## 2023-06-09 DIAGNOSIS — R41841 Cognitive communication deficit: Secondary | ICD-10-CM | POA: Diagnosis not present

## 2023-06-09 DIAGNOSIS — R278 Other lack of coordination: Secondary | ICD-10-CM | POA: Diagnosis not present

## 2023-06-09 DIAGNOSIS — M6389 Disorders of muscle in diseases classified elsewhere, multiple sites: Secondary | ICD-10-CM | POA: Diagnosis not present

## 2023-06-09 DIAGNOSIS — I6389 Other cerebral infarction: Secondary | ICD-10-CM | POA: Diagnosis not present

## 2023-06-09 DIAGNOSIS — R293 Abnormal posture: Secondary | ICD-10-CM | POA: Diagnosis not present

## 2023-06-10 DIAGNOSIS — I6389 Other cerebral infarction: Secondary | ICD-10-CM | POA: Diagnosis not present

## 2023-06-10 DIAGNOSIS — R278 Other lack of coordination: Secondary | ICD-10-CM | POA: Diagnosis not present

## 2023-06-10 DIAGNOSIS — M6389 Disorders of muscle in diseases classified elsewhere, multiple sites: Secondary | ICD-10-CM | POA: Diagnosis not present

## 2023-06-10 DIAGNOSIS — I69328 Other speech and language deficits following cerebral infarction: Secondary | ICD-10-CM | POA: Diagnosis not present

## 2023-06-10 DIAGNOSIS — R41841 Cognitive communication deficit: Secondary | ICD-10-CM | POA: Diagnosis not present

## 2023-06-10 DIAGNOSIS — R293 Abnormal posture: Secondary | ICD-10-CM | POA: Diagnosis not present

## 2023-06-14 DIAGNOSIS — I6389 Other cerebral infarction: Secondary | ICD-10-CM | POA: Diagnosis not present

## 2023-06-14 DIAGNOSIS — I69328 Other speech and language deficits following cerebral infarction: Secondary | ICD-10-CM | POA: Diagnosis not present

## 2023-06-14 DIAGNOSIS — R41841 Cognitive communication deficit: Secondary | ICD-10-CM | POA: Diagnosis not present

## 2023-06-14 DIAGNOSIS — R278 Other lack of coordination: Secondary | ICD-10-CM | POA: Diagnosis not present

## 2023-06-14 DIAGNOSIS — M6389 Disorders of muscle in diseases classified elsewhere, multiple sites: Secondary | ICD-10-CM | POA: Diagnosis not present

## 2023-06-14 DIAGNOSIS — R293 Abnormal posture: Secondary | ICD-10-CM | POA: Diagnosis not present

## 2023-06-16 DIAGNOSIS — R41841 Cognitive communication deficit: Secondary | ICD-10-CM | POA: Diagnosis not present

## 2023-06-16 DIAGNOSIS — M6389 Disorders of muscle in diseases classified elsewhere, multiple sites: Secondary | ICD-10-CM | POA: Diagnosis not present

## 2023-06-16 DIAGNOSIS — I69328 Other speech and language deficits following cerebral infarction: Secondary | ICD-10-CM | POA: Diagnosis not present

## 2023-06-16 DIAGNOSIS — R293 Abnormal posture: Secondary | ICD-10-CM | POA: Diagnosis not present

## 2023-06-16 DIAGNOSIS — I6389 Other cerebral infarction: Secondary | ICD-10-CM | POA: Diagnosis not present

## 2023-06-16 DIAGNOSIS — R278 Other lack of coordination: Secondary | ICD-10-CM | POA: Diagnosis not present

## 2023-06-17 DIAGNOSIS — M6389 Disorders of muscle in diseases classified elsewhere, multiple sites: Secondary | ICD-10-CM | POA: Diagnosis not present

## 2023-06-17 DIAGNOSIS — I6389 Other cerebral infarction: Secondary | ICD-10-CM | POA: Diagnosis not present

## 2023-06-17 DIAGNOSIS — R293 Abnormal posture: Secondary | ICD-10-CM | POA: Diagnosis not present

## 2023-06-17 DIAGNOSIS — R278 Other lack of coordination: Secondary | ICD-10-CM | POA: Diagnosis not present

## 2023-06-17 DIAGNOSIS — R41841 Cognitive communication deficit: Secondary | ICD-10-CM | POA: Diagnosis not present

## 2023-06-17 DIAGNOSIS — I69328 Other speech and language deficits following cerebral infarction: Secondary | ICD-10-CM | POA: Diagnosis not present

## 2023-06-21 DIAGNOSIS — I69328 Other speech and language deficits following cerebral infarction: Secondary | ICD-10-CM | POA: Diagnosis not present

## 2023-06-21 DIAGNOSIS — R41841 Cognitive communication deficit: Secondary | ICD-10-CM | POA: Diagnosis not present

## 2023-06-21 DIAGNOSIS — G8111 Spastic hemiplegia affecting right dominant side: Secondary | ICD-10-CM | POA: Diagnosis not present

## 2023-06-21 DIAGNOSIS — I6389 Other cerebral infarction: Secondary | ICD-10-CM | POA: Diagnosis not present

## 2023-06-22 DIAGNOSIS — I6389 Other cerebral infarction: Secondary | ICD-10-CM | POA: Diagnosis not present

## 2023-06-22 DIAGNOSIS — G8111 Spastic hemiplegia affecting right dominant side: Secondary | ICD-10-CM | POA: Diagnosis not present

## 2023-06-22 DIAGNOSIS — R41841 Cognitive communication deficit: Secondary | ICD-10-CM | POA: Diagnosis not present

## 2023-06-22 DIAGNOSIS — I69328 Other speech and language deficits following cerebral infarction: Secondary | ICD-10-CM | POA: Diagnosis not present

## 2023-06-28 DIAGNOSIS — I6389 Other cerebral infarction: Secondary | ICD-10-CM | POA: Diagnosis not present

## 2023-06-28 DIAGNOSIS — G8111 Spastic hemiplegia affecting right dominant side: Secondary | ICD-10-CM | POA: Diagnosis not present

## 2023-06-28 DIAGNOSIS — R41841 Cognitive communication deficit: Secondary | ICD-10-CM | POA: Diagnosis not present

## 2023-06-28 DIAGNOSIS — I69328 Other speech and language deficits following cerebral infarction: Secondary | ICD-10-CM | POA: Diagnosis not present

## 2023-06-30 DIAGNOSIS — R41841 Cognitive communication deficit: Secondary | ICD-10-CM | POA: Diagnosis not present

## 2023-06-30 DIAGNOSIS — I6389 Other cerebral infarction: Secondary | ICD-10-CM | POA: Diagnosis not present

## 2023-06-30 DIAGNOSIS — I69328 Other speech and language deficits following cerebral infarction: Secondary | ICD-10-CM | POA: Diagnosis not present

## 2023-06-30 DIAGNOSIS — G8111 Spastic hemiplegia affecting right dominant side: Secondary | ICD-10-CM | POA: Diagnosis not present

## 2023-07-04 DIAGNOSIS — I6389 Other cerebral infarction: Secondary | ICD-10-CM | POA: Diagnosis not present

## 2023-07-04 DIAGNOSIS — R41841 Cognitive communication deficit: Secondary | ICD-10-CM | POA: Diagnosis not present

## 2023-07-04 DIAGNOSIS — I69328 Other speech and language deficits following cerebral infarction: Secondary | ICD-10-CM | POA: Diagnosis not present

## 2023-07-04 DIAGNOSIS — G8111 Spastic hemiplegia affecting right dominant side: Secondary | ICD-10-CM | POA: Diagnosis not present

## 2023-07-07 DIAGNOSIS — I6389 Other cerebral infarction: Secondary | ICD-10-CM | POA: Diagnosis not present

## 2023-07-07 DIAGNOSIS — I69328 Other speech and language deficits following cerebral infarction: Secondary | ICD-10-CM | POA: Diagnosis not present

## 2023-07-07 DIAGNOSIS — R41841 Cognitive communication deficit: Secondary | ICD-10-CM | POA: Diagnosis not present

## 2023-07-07 DIAGNOSIS — G8111 Spastic hemiplegia affecting right dominant side: Secondary | ICD-10-CM | POA: Diagnosis not present

## 2023-07-12 DIAGNOSIS — I69328 Other speech and language deficits following cerebral infarction: Secondary | ICD-10-CM | POA: Diagnosis not present

## 2023-07-12 DIAGNOSIS — I6389 Other cerebral infarction: Secondary | ICD-10-CM | POA: Diagnosis not present

## 2023-07-12 DIAGNOSIS — G8111 Spastic hemiplegia affecting right dominant side: Secondary | ICD-10-CM | POA: Diagnosis not present

## 2023-07-12 DIAGNOSIS — R41841 Cognitive communication deficit: Secondary | ICD-10-CM | POA: Diagnosis not present

## 2023-07-14 DIAGNOSIS — I6389 Other cerebral infarction: Secondary | ICD-10-CM | POA: Diagnosis not present

## 2023-07-14 DIAGNOSIS — G8111 Spastic hemiplegia affecting right dominant side: Secondary | ICD-10-CM | POA: Diagnosis not present

## 2023-07-14 DIAGNOSIS — I69328 Other speech and language deficits following cerebral infarction: Secondary | ICD-10-CM | POA: Diagnosis not present

## 2023-07-14 DIAGNOSIS — R41841 Cognitive communication deficit: Secondary | ICD-10-CM | POA: Diagnosis not present

## 2023-07-19 DIAGNOSIS — I69328 Other speech and language deficits following cerebral infarction: Secondary | ICD-10-CM | POA: Diagnosis not present

## 2023-07-19 DIAGNOSIS — R41841 Cognitive communication deficit: Secondary | ICD-10-CM | POA: Diagnosis not present

## 2023-07-19 DIAGNOSIS — G8111 Spastic hemiplegia affecting right dominant side: Secondary | ICD-10-CM | POA: Diagnosis not present

## 2023-07-19 DIAGNOSIS — I6389 Other cerebral infarction: Secondary | ICD-10-CM | POA: Diagnosis not present

## 2023-07-22 DIAGNOSIS — G8111 Spastic hemiplegia affecting right dominant side: Secondary | ICD-10-CM | POA: Diagnosis not present

## 2023-07-22 DIAGNOSIS — I69328 Other speech and language deficits following cerebral infarction: Secondary | ICD-10-CM | POA: Diagnosis not present

## 2023-07-22 DIAGNOSIS — R41841 Cognitive communication deficit: Secondary | ICD-10-CM | POA: Diagnosis not present

## 2023-07-22 DIAGNOSIS — I6389 Other cerebral infarction: Secondary | ICD-10-CM | POA: Diagnosis not present

## 2023-07-26 DIAGNOSIS — G8111 Spastic hemiplegia affecting right dominant side: Secondary | ICD-10-CM | POA: Diagnosis not present

## 2023-07-26 DIAGNOSIS — I69328 Other speech and language deficits following cerebral infarction: Secondary | ICD-10-CM | POA: Diagnosis not present

## 2023-07-26 DIAGNOSIS — I6389 Other cerebral infarction: Secondary | ICD-10-CM | POA: Diagnosis not present

## 2023-07-26 DIAGNOSIS — R41841 Cognitive communication deficit: Secondary | ICD-10-CM | POA: Diagnosis not present

## 2023-07-28 DIAGNOSIS — I69328 Other speech and language deficits following cerebral infarction: Secondary | ICD-10-CM | POA: Diagnosis not present

## 2023-07-28 DIAGNOSIS — I6389 Other cerebral infarction: Secondary | ICD-10-CM | POA: Diagnosis not present

## 2023-07-28 DIAGNOSIS — G8111 Spastic hemiplegia affecting right dominant side: Secondary | ICD-10-CM | POA: Diagnosis not present

## 2023-07-28 DIAGNOSIS — R41841 Cognitive communication deficit: Secondary | ICD-10-CM | POA: Diagnosis not present

## 2023-08-03 ENCOUNTER — Encounter: Payer: Medicare Other | Attending: Physical Medicine & Rehabilitation | Admitting: Physical Medicine & Rehabilitation

## 2023-08-03 ENCOUNTER — Encounter: Payer: Self-pay | Admitting: Physical Medicine & Rehabilitation

## 2023-08-03 VITALS — BP 124/73 | HR 71 | Ht 65.0 in | Wt 114.0 lb

## 2023-08-03 DIAGNOSIS — I69351 Hemiplegia and hemiparesis following cerebral infarction affecting right dominant side: Secondary | ICD-10-CM | POA: Diagnosis not present

## 2023-08-03 MED ORDER — SODIUM CHLORIDE (PF) 0.9 % IJ SOLN
5.0000 mL | Freq: Once | INTRAMUSCULAR | Status: AC
  Administered 2023-08-03: 5 mL

## 2023-08-03 MED ORDER — ONABOTULINUMTOXINA 100 UNITS IJ SOLR
500.0000 [IU] | Freq: Once | INTRAMUSCULAR | Status: AC
  Administered 2023-08-03: 500 [IU] via INTRAMUSCULAR

## 2023-08-03 NOTE — Addendum Note (Signed)
 Addended by: Valdis Bevill W on: 08/03/2023 04:22 PM   Modules accepted: Orders

## 2023-08-03 NOTE — Progress Notes (Signed)
 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 This includes bleeding, bruising, infection, excessive weakness, or medication side effects. A REMS form is on file and signed.   Needle: 50mm injectable monopolar needle electrode    Number of units per muscle Pectoralis Major 50 units Pectoralis Minor 50 units Biceps 100 units Brachioradialis 60 units FCR 20 units FCU 20 units FDS 50 units FDP 50 units FPL 0 units Palmaris Longus 0 units Pronator Teres 0 units Pronator Quadratus 0 units Lumbricals 0 units Quads  100 units in 4 access points All injections were done after obtaining appropriate EMG activity and after negative drawback for blood. The patient tolerated the procedure well. Post procedure instructions were given. Return in about 3 months (around 11/02/2023) for botox 500 u RUE. Potentially quad again

## 2023-08-03 NOTE — Patient Instructions (Signed)
 ALWAYS FEEL FREE TO CALL OUR OFFICE WITH ANY PROBLEMS OR QUESTIONS [phone])  **PLEASE NOTE** ALL MEDICATION REFILL REQUESTS (INCLUDING CONTROLLED SUBSTANCES) NEED TO BE MADE AT LEAST 7 DAYS PRIOR TO REFILL BEING DUE. ANY REFILL REQUESTS INSIDE THAT TIME FRAME MAY RESULT IN DELAYS IN RECEIVING YOUR PRESCRIPTION.

## 2023-08-09 DIAGNOSIS — R41841 Cognitive communication deficit: Secondary | ICD-10-CM | POA: Diagnosis not present

## 2023-08-09 DIAGNOSIS — I69328 Other speech and language deficits following cerebral infarction: Secondary | ICD-10-CM | POA: Diagnosis not present

## 2023-08-09 DIAGNOSIS — G8111 Spastic hemiplegia affecting right dominant side: Secondary | ICD-10-CM | POA: Diagnosis not present

## 2023-08-09 DIAGNOSIS — I6389 Other cerebral infarction: Secondary | ICD-10-CM | POA: Diagnosis not present

## 2023-08-12 DIAGNOSIS — G8111 Spastic hemiplegia affecting right dominant side: Secondary | ICD-10-CM | POA: Diagnosis not present

## 2023-08-12 DIAGNOSIS — I6389 Other cerebral infarction: Secondary | ICD-10-CM | POA: Diagnosis not present

## 2023-08-12 DIAGNOSIS — R41841 Cognitive communication deficit: Secondary | ICD-10-CM | POA: Diagnosis not present

## 2023-08-12 DIAGNOSIS — I69328 Other speech and language deficits following cerebral infarction: Secondary | ICD-10-CM | POA: Diagnosis not present

## 2023-08-16 DIAGNOSIS — R41841 Cognitive communication deficit: Secondary | ICD-10-CM | POA: Diagnosis not present

## 2023-08-16 DIAGNOSIS — I69328 Other speech and language deficits following cerebral infarction: Secondary | ICD-10-CM | POA: Diagnosis not present

## 2023-08-16 DIAGNOSIS — G8111 Spastic hemiplegia affecting right dominant side: Secondary | ICD-10-CM | POA: Diagnosis not present

## 2023-08-16 DIAGNOSIS — I6389 Other cerebral infarction: Secondary | ICD-10-CM | POA: Diagnosis not present

## 2023-08-18 DIAGNOSIS — I6389 Other cerebral infarction: Secondary | ICD-10-CM | POA: Diagnosis not present

## 2023-08-18 DIAGNOSIS — R41841 Cognitive communication deficit: Secondary | ICD-10-CM | POA: Diagnosis not present

## 2023-08-18 DIAGNOSIS — G8111 Spastic hemiplegia affecting right dominant side: Secondary | ICD-10-CM | POA: Diagnosis not present

## 2023-08-18 DIAGNOSIS — I69328 Other speech and language deficits following cerebral infarction: Secondary | ICD-10-CM | POA: Diagnosis not present

## 2023-08-23 DIAGNOSIS — R41841 Cognitive communication deficit: Secondary | ICD-10-CM | POA: Diagnosis not present

## 2023-08-23 DIAGNOSIS — G8111 Spastic hemiplegia affecting right dominant side: Secondary | ICD-10-CM | POA: Diagnosis not present

## 2023-08-23 DIAGNOSIS — I6389 Other cerebral infarction: Secondary | ICD-10-CM | POA: Diagnosis not present

## 2023-08-23 DIAGNOSIS — I69328 Other speech and language deficits following cerebral infarction: Secondary | ICD-10-CM | POA: Diagnosis not present

## 2023-08-25 DIAGNOSIS — I69328 Other speech and language deficits following cerebral infarction: Secondary | ICD-10-CM | POA: Diagnosis not present

## 2023-08-25 DIAGNOSIS — R41841 Cognitive communication deficit: Secondary | ICD-10-CM | POA: Diagnosis not present

## 2023-08-25 DIAGNOSIS — I6389 Other cerebral infarction: Secondary | ICD-10-CM | POA: Diagnosis not present

## 2023-08-25 DIAGNOSIS — G8111 Spastic hemiplegia affecting right dominant side: Secondary | ICD-10-CM | POA: Diagnosis not present

## 2023-08-30 DIAGNOSIS — I69328 Other speech and language deficits following cerebral infarction: Secondary | ICD-10-CM | POA: Diagnosis not present

## 2023-08-30 DIAGNOSIS — G8111 Spastic hemiplegia affecting right dominant side: Secondary | ICD-10-CM | POA: Diagnosis not present

## 2023-08-30 DIAGNOSIS — I6389 Other cerebral infarction: Secondary | ICD-10-CM | POA: Diagnosis not present

## 2023-08-30 DIAGNOSIS — R41841 Cognitive communication deficit: Secondary | ICD-10-CM | POA: Diagnosis not present

## 2023-09-01 DIAGNOSIS — I6389 Other cerebral infarction: Secondary | ICD-10-CM | POA: Diagnosis not present

## 2023-09-01 DIAGNOSIS — G8111 Spastic hemiplegia affecting right dominant side: Secondary | ICD-10-CM | POA: Diagnosis not present

## 2023-09-01 DIAGNOSIS — I69328 Other speech and language deficits following cerebral infarction: Secondary | ICD-10-CM | POA: Diagnosis not present

## 2023-09-01 DIAGNOSIS — R41841 Cognitive communication deficit: Secondary | ICD-10-CM | POA: Diagnosis not present

## 2023-09-06 DIAGNOSIS — I69328 Other speech and language deficits following cerebral infarction: Secondary | ICD-10-CM | POA: Diagnosis not present

## 2023-09-06 DIAGNOSIS — I6389 Other cerebral infarction: Secondary | ICD-10-CM | POA: Diagnosis not present

## 2023-09-06 DIAGNOSIS — G8111 Spastic hemiplegia affecting right dominant side: Secondary | ICD-10-CM | POA: Diagnosis not present

## 2023-09-06 DIAGNOSIS — R41841 Cognitive communication deficit: Secondary | ICD-10-CM | POA: Diagnosis not present

## 2023-09-09 DIAGNOSIS — R41841 Cognitive communication deficit: Secondary | ICD-10-CM | POA: Diagnosis not present

## 2023-09-09 DIAGNOSIS — G8111 Spastic hemiplegia affecting right dominant side: Secondary | ICD-10-CM | POA: Diagnosis not present

## 2023-09-09 DIAGNOSIS — I6389 Other cerebral infarction: Secondary | ICD-10-CM | POA: Diagnosis not present

## 2023-09-09 DIAGNOSIS — I69328 Other speech and language deficits following cerebral infarction: Secondary | ICD-10-CM | POA: Diagnosis not present

## 2023-09-13 DIAGNOSIS — R41841 Cognitive communication deficit: Secondary | ICD-10-CM | POA: Diagnosis not present

## 2023-09-13 DIAGNOSIS — I69328 Other speech and language deficits following cerebral infarction: Secondary | ICD-10-CM | POA: Diagnosis not present

## 2023-09-13 DIAGNOSIS — G8111 Spastic hemiplegia affecting right dominant side: Secondary | ICD-10-CM | POA: Diagnosis not present

## 2023-09-13 DIAGNOSIS — I6389 Other cerebral infarction: Secondary | ICD-10-CM | POA: Diagnosis not present

## 2023-09-15 DIAGNOSIS — I6389 Other cerebral infarction: Secondary | ICD-10-CM | POA: Diagnosis not present

## 2023-09-15 DIAGNOSIS — R41841 Cognitive communication deficit: Secondary | ICD-10-CM | POA: Diagnosis not present

## 2023-09-15 DIAGNOSIS — I69328 Other speech and language deficits following cerebral infarction: Secondary | ICD-10-CM | POA: Diagnosis not present

## 2023-09-15 DIAGNOSIS — G8111 Spastic hemiplegia affecting right dominant side: Secondary | ICD-10-CM | POA: Diagnosis not present

## 2023-09-20 DIAGNOSIS — R41841 Cognitive communication deficit: Secondary | ICD-10-CM | POA: Diagnosis not present

## 2023-09-20 DIAGNOSIS — R2681 Unsteadiness on feet: Secondary | ICD-10-CM | POA: Diagnosis not present

## 2023-09-20 DIAGNOSIS — G8111 Spastic hemiplegia affecting right dominant side: Secondary | ICD-10-CM | POA: Diagnosis not present

## 2023-09-20 DIAGNOSIS — I69328 Other speech and language deficits following cerebral infarction: Secondary | ICD-10-CM | POA: Diagnosis not present

## 2023-09-20 DIAGNOSIS — I6389 Other cerebral infarction: Secondary | ICD-10-CM | POA: Diagnosis not present

## 2023-09-20 DIAGNOSIS — R4701 Aphasia: Secondary | ICD-10-CM | POA: Diagnosis not present

## 2023-09-22 DIAGNOSIS — I6389 Other cerebral infarction: Secondary | ICD-10-CM | POA: Diagnosis not present

## 2023-09-22 DIAGNOSIS — I69328 Other speech and language deficits following cerebral infarction: Secondary | ICD-10-CM | POA: Diagnosis not present

## 2023-09-22 DIAGNOSIS — R2681 Unsteadiness on feet: Secondary | ICD-10-CM | POA: Diagnosis not present

## 2023-09-22 DIAGNOSIS — G8111 Spastic hemiplegia affecting right dominant side: Secondary | ICD-10-CM | POA: Diagnosis not present

## 2023-09-22 DIAGNOSIS — R41841 Cognitive communication deficit: Secondary | ICD-10-CM | POA: Diagnosis not present

## 2023-09-22 DIAGNOSIS — R4701 Aphasia: Secondary | ICD-10-CM | POA: Diagnosis not present

## 2023-09-26 DIAGNOSIS — I6389 Other cerebral infarction: Secondary | ICD-10-CM | POA: Diagnosis not present

## 2023-09-26 DIAGNOSIS — R4701 Aphasia: Secondary | ICD-10-CM | POA: Diagnosis not present

## 2023-09-26 DIAGNOSIS — I69328 Other speech and language deficits following cerebral infarction: Secondary | ICD-10-CM | POA: Diagnosis not present

## 2023-09-26 DIAGNOSIS — G8111 Spastic hemiplegia affecting right dominant side: Secondary | ICD-10-CM | POA: Diagnosis not present

## 2023-09-26 DIAGNOSIS — R2681 Unsteadiness on feet: Secondary | ICD-10-CM | POA: Diagnosis not present

## 2023-09-26 DIAGNOSIS — R41841 Cognitive communication deficit: Secondary | ICD-10-CM | POA: Diagnosis not present

## 2023-09-27 DIAGNOSIS — H401121 Primary open-angle glaucoma, left eye, mild stage: Secondary | ICD-10-CM | POA: Diagnosis not present

## 2023-09-27 DIAGNOSIS — H5212 Myopia, left eye: Secondary | ICD-10-CM | POA: Diagnosis not present

## 2023-09-27 DIAGNOSIS — Z961 Presence of intraocular lens: Secondary | ICD-10-CM | POA: Diagnosis not present

## 2023-09-27 DIAGNOSIS — H401113 Primary open-angle glaucoma, right eye, severe stage: Secondary | ICD-10-CM | POA: Diagnosis not present

## 2023-09-30 DIAGNOSIS — I6389 Other cerebral infarction: Secondary | ICD-10-CM | POA: Diagnosis not present

## 2023-09-30 DIAGNOSIS — R2681 Unsteadiness on feet: Secondary | ICD-10-CM | POA: Diagnosis not present

## 2023-09-30 DIAGNOSIS — I69328 Other speech and language deficits following cerebral infarction: Secondary | ICD-10-CM | POA: Diagnosis not present

## 2023-09-30 DIAGNOSIS — G8111 Spastic hemiplegia affecting right dominant side: Secondary | ICD-10-CM | POA: Diagnosis not present

## 2023-09-30 DIAGNOSIS — R41841 Cognitive communication deficit: Secondary | ICD-10-CM | POA: Diagnosis not present

## 2023-09-30 DIAGNOSIS — R4701 Aphasia: Secondary | ICD-10-CM | POA: Diagnosis not present

## 2023-10-03 DIAGNOSIS — I69328 Other speech and language deficits following cerebral infarction: Secondary | ICD-10-CM | POA: Diagnosis not present

## 2023-10-03 DIAGNOSIS — G8111 Spastic hemiplegia affecting right dominant side: Secondary | ICD-10-CM | POA: Diagnosis not present

## 2023-10-03 DIAGNOSIS — I6389 Other cerebral infarction: Secondary | ICD-10-CM | POA: Diagnosis not present

## 2023-10-03 DIAGNOSIS — R2681 Unsteadiness on feet: Secondary | ICD-10-CM | POA: Diagnosis not present

## 2023-10-03 DIAGNOSIS — R4701 Aphasia: Secondary | ICD-10-CM | POA: Diagnosis not present

## 2023-10-03 DIAGNOSIS — R41841 Cognitive communication deficit: Secondary | ICD-10-CM | POA: Diagnosis not present

## 2023-10-05 DIAGNOSIS — I6389 Other cerebral infarction: Secondary | ICD-10-CM | POA: Diagnosis not present

## 2023-10-05 DIAGNOSIS — R41841 Cognitive communication deficit: Secondary | ICD-10-CM | POA: Diagnosis not present

## 2023-10-05 DIAGNOSIS — R2681 Unsteadiness on feet: Secondary | ICD-10-CM | POA: Diagnosis not present

## 2023-10-05 DIAGNOSIS — R4701 Aphasia: Secondary | ICD-10-CM | POA: Diagnosis not present

## 2023-10-05 DIAGNOSIS — I69328 Other speech and language deficits following cerebral infarction: Secondary | ICD-10-CM | POA: Diagnosis not present

## 2023-10-05 DIAGNOSIS — G8111 Spastic hemiplegia affecting right dominant side: Secondary | ICD-10-CM | POA: Diagnosis not present

## 2023-10-06 DIAGNOSIS — R2681 Unsteadiness on feet: Secondary | ICD-10-CM | POA: Diagnosis not present

## 2023-10-06 DIAGNOSIS — R4701 Aphasia: Secondary | ICD-10-CM | POA: Diagnosis not present

## 2023-10-06 DIAGNOSIS — G8111 Spastic hemiplegia affecting right dominant side: Secondary | ICD-10-CM | POA: Diagnosis not present

## 2023-10-06 DIAGNOSIS — R41841 Cognitive communication deficit: Secondary | ICD-10-CM | POA: Diagnosis not present

## 2023-10-06 DIAGNOSIS — I69328 Other speech and language deficits following cerebral infarction: Secondary | ICD-10-CM | POA: Diagnosis not present

## 2023-10-06 DIAGNOSIS — I6389 Other cerebral infarction: Secondary | ICD-10-CM | POA: Diagnosis not present

## 2023-10-07 DIAGNOSIS — R2681 Unsteadiness on feet: Secondary | ICD-10-CM | POA: Diagnosis not present

## 2023-10-07 DIAGNOSIS — I69328 Other speech and language deficits following cerebral infarction: Secondary | ICD-10-CM | POA: Diagnosis not present

## 2023-10-07 DIAGNOSIS — G8111 Spastic hemiplegia affecting right dominant side: Secondary | ICD-10-CM | POA: Diagnosis not present

## 2023-10-07 DIAGNOSIS — R4701 Aphasia: Secondary | ICD-10-CM | POA: Diagnosis not present

## 2023-10-07 DIAGNOSIS — I6389 Other cerebral infarction: Secondary | ICD-10-CM | POA: Diagnosis not present

## 2023-10-07 DIAGNOSIS — R41841 Cognitive communication deficit: Secondary | ICD-10-CM | POA: Diagnosis not present

## 2023-10-10 DIAGNOSIS — I6389 Other cerebral infarction: Secondary | ICD-10-CM | POA: Diagnosis not present

## 2023-10-10 DIAGNOSIS — I69328 Other speech and language deficits following cerebral infarction: Secondary | ICD-10-CM | POA: Diagnosis not present

## 2023-10-10 DIAGNOSIS — R41841 Cognitive communication deficit: Secondary | ICD-10-CM | POA: Diagnosis not present

## 2023-10-10 DIAGNOSIS — R2681 Unsteadiness on feet: Secondary | ICD-10-CM | POA: Diagnosis not present

## 2023-10-10 DIAGNOSIS — G8111 Spastic hemiplegia affecting right dominant side: Secondary | ICD-10-CM | POA: Diagnosis not present

## 2023-10-10 DIAGNOSIS — R4701 Aphasia: Secondary | ICD-10-CM | POA: Diagnosis not present

## 2023-10-11 DIAGNOSIS — R2681 Unsteadiness on feet: Secondary | ICD-10-CM | POA: Diagnosis not present

## 2023-10-11 DIAGNOSIS — R4701 Aphasia: Secondary | ICD-10-CM | POA: Diagnosis not present

## 2023-10-11 DIAGNOSIS — I6389 Other cerebral infarction: Secondary | ICD-10-CM | POA: Diagnosis not present

## 2023-10-11 DIAGNOSIS — G8111 Spastic hemiplegia affecting right dominant side: Secondary | ICD-10-CM | POA: Diagnosis not present

## 2023-10-11 DIAGNOSIS — I69328 Other speech and language deficits following cerebral infarction: Secondary | ICD-10-CM | POA: Diagnosis not present

## 2023-10-11 DIAGNOSIS — R41841 Cognitive communication deficit: Secondary | ICD-10-CM | POA: Diagnosis not present

## 2023-10-13 DIAGNOSIS — I69328 Other speech and language deficits following cerebral infarction: Secondary | ICD-10-CM | POA: Diagnosis not present

## 2023-10-13 DIAGNOSIS — G8111 Spastic hemiplegia affecting right dominant side: Secondary | ICD-10-CM | POA: Diagnosis not present

## 2023-10-13 DIAGNOSIS — R4701 Aphasia: Secondary | ICD-10-CM | POA: Diagnosis not present

## 2023-10-13 DIAGNOSIS — R2681 Unsteadiness on feet: Secondary | ICD-10-CM | POA: Diagnosis not present

## 2023-10-13 DIAGNOSIS — R41841 Cognitive communication deficit: Secondary | ICD-10-CM | POA: Diagnosis not present

## 2023-10-13 DIAGNOSIS — I6389 Other cerebral infarction: Secondary | ICD-10-CM | POA: Diagnosis not present

## 2023-10-17 DIAGNOSIS — I6389 Other cerebral infarction: Secondary | ICD-10-CM | POA: Diagnosis not present

## 2023-10-17 DIAGNOSIS — I69328 Other speech and language deficits following cerebral infarction: Secondary | ICD-10-CM | POA: Diagnosis not present

## 2023-10-17 DIAGNOSIS — R4701 Aphasia: Secondary | ICD-10-CM | POA: Diagnosis not present

## 2023-10-17 DIAGNOSIS — R2681 Unsteadiness on feet: Secondary | ICD-10-CM | POA: Diagnosis not present

## 2023-10-17 DIAGNOSIS — R41841 Cognitive communication deficit: Secondary | ICD-10-CM | POA: Diagnosis not present

## 2023-10-17 DIAGNOSIS — G8111 Spastic hemiplegia affecting right dominant side: Secondary | ICD-10-CM | POA: Diagnosis not present

## 2023-10-18 DIAGNOSIS — R4701 Aphasia: Secondary | ICD-10-CM | POA: Diagnosis not present

## 2023-10-18 DIAGNOSIS — I6389 Other cerebral infarction: Secondary | ICD-10-CM | POA: Diagnosis not present

## 2023-10-18 DIAGNOSIS — I69328 Other speech and language deficits following cerebral infarction: Secondary | ICD-10-CM | POA: Diagnosis not present

## 2023-10-18 DIAGNOSIS — R41841 Cognitive communication deficit: Secondary | ICD-10-CM | POA: Diagnosis not present

## 2023-10-18 DIAGNOSIS — R2681 Unsteadiness on feet: Secondary | ICD-10-CM | POA: Diagnosis not present

## 2023-10-18 DIAGNOSIS — G8111 Spastic hemiplegia affecting right dominant side: Secondary | ICD-10-CM | POA: Diagnosis not present

## 2023-10-20 DIAGNOSIS — I69328 Other speech and language deficits following cerebral infarction: Secondary | ICD-10-CM | POA: Diagnosis not present

## 2023-10-20 DIAGNOSIS — G8111 Spastic hemiplegia affecting right dominant side: Secondary | ICD-10-CM | POA: Diagnosis not present

## 2023-10-20 DIAGNOSIS — R41841 Cognitive communication deficit: Secondary | ICD-10-CM | POA: Diagnosis not present

## 2023-10-20 DIAGNOSIS — R2681 Unsteadiness on feet: Secondary | ICD-10-CM | POA: Diagnosis not present

## 2023-10-20 DIAGNOSIS — I6389 Other cerebral infarction: Secondary | ICD-10-CM | POA: Diagnosis not present

## 2023-10-20 DIAGNOSIS — R4701 Aphasia: Secondary | ICD-10-CM | POA: Diagnosis not present

## 2023-10-25 DIAGNOSIS — I6389 Other cerebral infarction: Secondary | ICD-10-CM | POA: Diagnosis not present

## 2023-10-25 DIAGNOSIS — R41841 Cognitive communication deficit: Secondary | ICD-10-CM | POA: Diagnosis not present

## 2023-10-25 DIAGNOSIS — I69328 Other speech and language deficits following cerebral infarction: Secondary | ICD-10-CM | POA: Diagnosis not present

## 2023-10-25 DIAGNOSIS — R4701 Aphasia: Secondary | ICD-10-CM | POA: Diagnosis not present

## 2023-10-25 DIAGNOSIS — R2681 Unsteadiness on feet: Secondary | ICD-10-CM | POA: Diagnosis not present

## 2023-10-25 DIAGNOSIS — G8111 Spastic hemiplegia affecting right dominant side: Secondary | ICD-10-CM | POA: Diagnosis not present

## 2023-10-27 DIAGNOSIS — G8111 Spastic hemiplegia affecting right dominant side: Secondary | ICD-10-CM | POA: Diagnosis not present

## 2023-10-27 DIAGNOSIS — R41841 Cognitive communication deficit: Secondary | ICD-10-CM | POA: Diagnosis not present

## 2023-10-27 DIAGNOSIS — R4701 Aphasia: Secondary | ICD-10-CM | POA: Diagnosis not present

## 2023-10-27 DIAGNOSIS — I6389 Other cerebral infarction: Secondary | ICD-10-CM | POA: Diagnosis not present

## 2023-10-27 DIAGNOSIS — I69328 Other speech and language deficits following cerebral infarction: Secondary | ICD-10-CM | POA: Diagnosis not present

## 2023-10-27 DIAGNOSIS — R2681 Unsteadiness on feet: Secondary | ICD-10-CM | POA: Diagnosis not present

## 2023-10-28 DIAGNOSIS — I350 Nonrheumatic aortic (valve) stenosis: Secondary | ICD-10-CM | POA: Diagnosis not present

## 2023-10-28 DIAGNOSIS — H9193 Unspecified hearing loss, bilateral: Secondary | ICD-10-CM | POA: Diagnosis not present

## 2023-10-28 DIAGNOSIS — K5790 Diverticulosis of intestine, part unspecified, without perforation or abscess without bleeding: Secondary | ICD-10-CM | POA: Diagnosis not present

## 2023-10-28 DIAGNOSIS — I340 Nonrheumatic mitral (valve) insufficiency: Secondary | ICD-10-CM | POA: Diagnosis not present

## 2023-10-28 DIAGNOSIS — I69320 Aphasia following cerebral infarction: Secondary | ICD-10-CM | POA: Diagnosis not present

## 2023-10-28 DIAGNOSIS — M8580 Other specified disorders of bone density and structure, unspecified site: Secondary | ICD-10-CM | POA: Diagnosis not present

## 2023-10-28 DIAGNOSIS — I10 Essential (primary) hypertension: Secondary | ICD-10-CM | POA: Diagnosis not present

## 2023-10-28 DIAGNOSIS — I69359 Hemiplegia and hemiparesis following cerebral infarction affecting unspecified side: Secondary | ICD-10-CM | POA: Diagnosis not present

## 2023-10-28 DIAGNOSIS — I119 Hypertensive heart disease without heart failure: Secondary | ICD-10-CM | POA: Diagnosis not present

## 2023-10-28 DIAGNOSIS — E785 Hyperlipidemia, unspecified: Secondary | ICD-10-CM | POA: Diagnosis not present

## 2023-10-28 DIAGNOSIS — Z79899 Other long term (current) drug therapy: Secondary | ICD-10-CM | POA: Diagnosis not present

## 2023-10-28 DIAGNOSIS — R946 Abnormal results of thyroid function studies: Secondary | ICD-10-CM | POA: Diagnosis not present

## 2023-10-31 ENCOUNTER — Telehealth (HOSPITAL_COMMUNITY): Payer: Self-pay

## 2023-10-31 NOTE — Telephone Encounter (Signed)
 Auth Submission: NO AUTH NEEDED Site of care: Site of care: [facility] Payer: Medicare A/B, AARP Supplement Medication & CPT/J Code(s) submitted: Prolia  (Denosumab ) [id number] Diagnosis Code: M81.0 Route of submission (phone, fax, portal):  Phone # Fax # Auth type: Buy/Bill HB Units/visits requested: 60mg  x 2 doses Reference number:  Approval from: 10/31/23 to 05/19/24

## 2023-11-01 ENCOUNTER — Other Ambulatory Visit (HOSPITAL_COMMUNITY): Payer: Self-pay | Admitting: *Deleted

## 2023-11-01 DIAGNOSIS — R41841 Cognitive communication deficit: Secondary | ICD-10-CM | POA: Diagnosis not present

## 2023-11-01 DIAGNOSIS — G8111 Spastic hemiplegia affecting right dominant side: Secondary | ICD-10-CM | POA: Diagnosis not present

## 2023-11-01 DIAGNOSIS — I69328 Other speech and language deficits following cerebral infarction: Secondary | ICD-10-CM | POA: Diagnosis not present

## 2023-11-01 DIAGNOSIS — I6389 Other cerebral infarction: Secondary | ICD-10-CM | POA: Diagnosis not present

## 2023-11-01 DIAGNOSIS — R4701 Aphasia: Secondary | ICD-10-CM | POA: Diagnosis not present

## 2023-11-01 DIAGNOSIS — R2681 Unsteadiness on feet: Secondary | ICD-10-CM | POA: Diagnosis not present

## 2023-11-02 ENCOUNTER — Encounter: Attending: Physical Medicine & Rehabilitation | Admitting: Physical Medicine & Rehabilitation

## 2023-11-02 ENCOUNTER — Encounter: Payer: Self-pay | Admitting: Physical Medicine & Rehabilitation

## 2023-11-02 VITALS — BP 120/67 | HR 73 | Ht 65.0 in

## 2023-11-02 DIAGNOSIS — I69351 Hemiplegia and hemiparesis following cerebral infarction affecting right dominant side: Secondary | ICD-10-CM | POA: Diagnosis not present

## 2023-11-02 MED ORDER — ONABOTULINUMTOXINA 100 UNITS IJ SOLR
500.0000 [IU] | Freq: Once | INTRAMUSCULAR | Status: AC
  Administered 2023-11-02: 500 [IU] via INTRAMUSCULAR

## 2023-11-02 NOTE — Patient Instructions (Signed)
 ALWAYS FEEL FREE TO CALL OUR OFFICE WITH ANY PROBLEMS OR QUESTIONS [phone])  **PLEASE NOTE** ALL MEDICATION REFILL REQUESTS (INCLUDING CONTROLLED SUBSTANCES) NEED TO BE MADE AT LEAST 7 DAYS PRIOR TO REFILL BEING DUE. ANY REFILL REQUESTS INSIDE THAT TIME FRAME MAY RESULT IN DELAYS IN RECEIVING YOUR PRESCRIPTION.

## 2023-11-02 NOTE — Progress Notes (Signed)
 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 This includes bleeding, bruising, infection, excessive weakness, or medication side effects. A REMS form is on file and signed.  Needle: 50mm injectable monopolar needle electrode    Number of units per muscle Pectoralis Major 0 units Pectoralis Minor 0 units Right teres major 25 u Right lats 75 u Biceps 0 units Brachioradialis 0 units FCR25 units FCU  25units FDS 100 units FDP 100 units FPL 0 units Palmaris Longus 0 units Pronator Teres 100 units Pronator Quadratus 50 units Lumbricals 50 units All injections were done after obtaining appropriate EMG activity and after negative drawback for blood. The patient tolerated the procedure well. Post procedure instructions were given. Return in about 3 months (around 02/02/2024) for 500 u botox  RUE.

## 2023-11-03 DIAGNOSIS — G8111 Spastic hemiplegia affecting right dominant side: Secondary | ICD-10-CM | POA: Diagnosis not present

## 2023-11-03 DIAGNOSIS — R4701 Aphasia: Secondary | ICD-10-CM | POA: Diagnosis not present

## 2023-11-03 DIAGNOSIS — R41841 Cognitive communication deficit: Secondary | ICD-10-CM | POA: Diagnosis not present

## 2023-11-03 DIAGNOSIS — R2681 Unsteadiness on feet: Secondary | ICD-10-CM | POA: Diagnosis not present

## 2023-11-03 DIAGNOSIS — I6389 Other cerebral infarction: Secondary | ICD-10-CM | POA: Diagnosis not present

## 2023-11-03 DIAGNOSIS — I69328 Other speech and language deficits following cerebral infarction: Secondary | ICD-10-CM | POA: Diagnosis not present

## 2023-11-04 ENCOUNTER — Ambulatory Visit (HOSPITAL_COMMUNITY)
Admission: RE | Admit: 2023-11-04 | Discharge: 2023-11-04 | Disposition: A | Discharge status: Home / Self Care | Source: Ambulatory Visit | Attending: Internal Medicine | Admitting: Internal Medicine

## 2023-11-04 DIAGNOSIS — M810 Age-related osteoporosis without current pathological fracture: Secondary | ICD-10-CM | POA: Insufficient documentation

## 2023-11-04 MED ORDER — DENOSUMAB 60 MG/ML ~~LOC~~ SOSY
PREFILLED_SYRINGE | SUBCUTANEOUS | Status: AC
  Filled 2023-11-04: qty 1

## 2023-11-04 MED ORDER — DENOSUMAB 60 MG/ML ~~LOC~~ SOSY
60.0000 mg | PREFILLED_SYRINGE | Freq: Once | SUBCUTANEOUS | Status: AC
  Administered 2023-11-04: 60 mg via SUBCUTANEOUS

## 2023-11-08 DIAGNOSIS — R4701 Aphasia: Secondary | ICD-10-CM | POA: Diagnosis not present

## 2023-11-08 DIAGNOSIS — I6389 Other cerebral infarction: Secondary | ICD-10-CM | POA: Diagnosis not present

## 2023-11-08 DIAGNOSIS — R41841 Cognitive communication deficit: Secondary | ICD-10-CM | POA: Diagnosis not present

## 2023-11-08 DIAGNOSIS — I69328 Other speech and language deficits following cerebral infarction: Secondary | ICD-10-CM | POA: Diagnosis not present

## 2023-11-08 DIAGNOSIS — R2681 Unsteadiness on feet: Secondary | ICD-10-CM | POA: Diagnosis not present

## 2023-11-08 DIAGNOSIS — G8111 Spastic hemiplegia affecting right dominant side: Secondary | ICD-10-CM | POA: Diagnosis not present

## 2023-11-10 DIAGNOSIS — R41841 Cognitive communication deficit: Secondary | ICD-10-CM | POA: Diagnosis not present

## 2023-11-10 DIAGNOSIS — R2681 Unsteadiness on feet: Secondary | ICD-10-CM | POA: Diagnosis not present

## 2023-11-10 DIAGNOSIS — R4701 Aphasia: Secondary | ICD-10-CM | POA: Diagnosis not present

## 2023-11-10 DIAGNOSIS — I69328 Other speech and language deficits following cerebral infarction: Secondary | ICD-10-CM | POA: Diagnosis not present

## 2023-11-10 DIAGNOSIS — G8111 Spastic hemiplegia affecting right dominant side: Secondary | ICD-10-CM | POA: Diagnosis not present

## 2023-11-10 DIAGNOSIS — I6389 Other cerebral infarction: Secondary | ICD-10-CM | POA: Diagnosis not present

## 2023-11-17 ENCOUNTER — Other Ambulatory Visit (HOSPITAL_COMMUNITY): Payer: Self-pay | Admitting: Internal Medicine

## 2023-11-17 DIAGNOSIS — I350 Nonrheumatic aortic (valve) stenosis: Secondary | ICD-10-CM

## 2023-11-18 ENCOUNTER — Telehealth (HOSPITAL_COMMUNITY): Payer: Self-pay | Admitting: Internal Medicine

## 2023-11-18 NOTE — Telephone Encounter (Signed)
 I called to schedule the ordered Myoview for the patient. I spoke with patients spouse , who was noton the DPR so I was just able to ask him to have pt to call the office. He states that she would not be at the appointment and that they saw the Dr 10 days ago and nothing ws mentioned to him and she would not be there. Order will be removed from the WQ. Thank you.

## 2023-11-22 DIAGNOSIS — R41841 Cognitive communication deficit: Secondary | ICD-10-CM | POA: Diagnosis not present

## 2023-11-22 DIAGNOSIS — I69328 Other speech and language deficits following cerebral infarction: Secondary | ICD-10-CM | POA: Diagnosis not present

## 2023-11-22 DIAGNOSIS — I6389 Other cerebral infarction: Secondary | ICD-10-CM | POA: Diagnosis not present

## 2023-11-22 DIAGNOSIS — G8111 Spastic hemiplegia affecting right dominant side: Secondary | ICD-10-CM | POA: Diagnosis not present

## 2023-11-24 DIAGNOSIS — I69328 Other speech and language deficits following cerebral infarction: Secondary | ICD-10-CM | POA: Diagnosis not present

## 2023-11-24 DIAGNOSIS — R41841 Cognitive communication deficit: Secondary | ICD-10-CM | POA: Diagnosis not present

## 2023-11-24 DIAGNOSIS — G8111 Spastic hemiplegia affecting right dominant side: Secondary | ICD-10-CM | POA: Diagnosis not present

## 2023-11-24 DIAGNOSIS — I6389 Other cerebral infarction: Secondary | ICD-10-CM | POA: Diagnosis not present

## 2023-11-29 DIAGNOSIS — G8111 Spastic hemiplegia affecting right dominant side: Secondary | ICD-10-CM | POA: Diagnosis not present

## 2023-11-29 DIAGNOSIS — R41841 Cognitive communication deficit: Secondary | ICD-10-CM | POA: Diagnosis not present

## 2023-11-29 DIAGNOSIS — I69328 Other speech and language deficits following cerebral infarction: Secondary | ICD-10-CM | POA: Diagnosis not present

## 2023-11-29 DIAGNOSIS — I6389 Other cerebral infarction: Secondary | ICD-10-CM | POA: Diagnosis not present

## 2023-12-01 DIAGNOSIS — I69328 Other speech and language deficits following cerebral infarction: Secondary | ICD-10-CM | POA: Diagnosis not present

## 2023-12-01 DIAGNOSIS — R41841 Cognitive communication deficit: Secondary | ICD-10-CM | POA: Diagnosis not present

## 2023-12-01 DIAGNOSIS — I6389 Other cerebral infarction: Secondary | ICD-10-CM | POA: Diagnosis not present

## 2023-12-01 DIAGNOSIS — G8111 Spastic hemiplegia affecting right dominant side: Secondary | ICD-10-CM | POA: Diagnosis not present

## 2023-12-06 DIAGNOSIS — G8111 Spastic hemiplegia affecting right dominant side: Secondary | ICD-10-CM | POA: Diagnosis not present

## 2023-12-06 DIAGNOSIS — I69328 Other speech and language deficits following cerebral infarction: Secondary | ICD-10-CM | POA: Diagnosis not present

## 2023-12-06 DIAGNOSIS — R41841 Cognitive communication deficit: Secondary | ICD-10-CM | POA: Diagnosis not present

## 2023-12-06 DIAGNOSIS — I6389 Other cerebral infarction: Secondary | ICD-10-CM | POA: Diagnosis not present

## 2023-12-08 DIAGNOSIS — I6389 Other cerebral infarction: Secondary | ICD-10-CM | POA: Diagnosis not present

## 2023-12-08 DIAGNOSIS — I69328 Other speech and language deficits following cerebral infarction: Secondary | ICD-10-CM | POA: Diagnosis not present

## 2023-12-08 DIAGNOSIS — G8111 Spastic hemiplegia affecting right dominant side: Secondary | ICD-10-CM | POA: Diagnosis not present

## 2023-12-08 DIAGNOSIS — R41841 Cognitive communication deficit: Secondary | ICD-10-CM | POA: Diagnosis not present

## 2023-12-13 DIAGNOSIS — I69328 Other speech and language deficits following cerebral infarction: Secondary | ICD-10-CM | POA: Diagnosis not present

## 2023-12-13 DIAGNOSIS — R2681 Unsteadiness on feet: Secondary | ICD-10-CM | POA: Diagnosis not present

## 2023-12-13 DIAGNOSIS — R41841 Cognitive communication deficit: Secondary | ICD-10-CM | POA: Diagnosis not present

## 2023-12-13 DIAGNOSIS — I69359 Hemiplegia and hemiparesis following cerebral infarction affecting unspecified side: Secondary | ICD-10-CM | POA: Diagnosis not present

## 2023-12-13 DIAGNOSIS — G8110 Spastic hemiplegia affecting unspecified side: Secondary | ICD-10-CM | POA: Diagnosis not present

## 2023-12-13 DIAGNOSIS — I6389 Other cerebral infarction: Secondary | ICD-10-CM | POA: Diagnosis not present

## 2023-12-13 DIAGNOSIS — G8111 Spastic hemiplegia affecting right dominant side: Secondary | ICD-10-CM | POA: Diagnosis not present

## 2023-12-14 DIAGNOSIS — I69359 Hemiplegia and hemiparesis following cerebral infarction affecting unspecified side: Secondary | ICD-10-CM | POA: Diagnosis not present

## 2023-12-14 DIAGNOSIS — G8110 Spastic hemiplegia affecting unspecified side: Secondary | ICD-10-CM | POA: Diagnosis not present

## 2023-12-14 DIAGNOSIS — R2681 Unsteadiness on feet: Secondary | ICD-10-CM | POA: Diagnosis not present

## 2023-12-15 DIAGNOSIS — I69328 Other speech and language deficits following cerebral infarction: Secondary | ICD-10-CM | POA: Diagnosis not present

## 2023-12-15 DIAGNOSIS — R41841 Cognitive communication deficit: Secondary | ICD-10-CM | POA: Diagnosis not present

## 2023-12-15 DIAGNOSIS — I6389 Other cerebral infarction: Secondary | ICD-10-CM | POA: Diagnosis not present

## 2023-12-15 DIAGNOSIS — G8111 Spastic hemiplegia affecting right dominant side: Secondary | ICD-10-CM | POA: Diagnosis not present

## 2023-12-16 DIAGNOSIS — I69359 Hemiplegia and hemiparesis following cerebral infarction affecting unspecified side: Secondary | ICD-10-CM | POA: Diagnosis not present

## 2023-12-16 DIAGNOSIS — R2681 Unsteadiness on feet: Secondary | ICD-10-CM | POA: Diagnosis not present

## 2023-12-16 DIAGNOSIS — G8110 Spastic hemiplegia affecting unspecified side: Secondary | ICD-10-CM | POA: Diagnosis not present

## 2023-12-19 DIAGNOSIS — G8110 Spastic hemiplegia affecting unspecified side: Secondary | ICD-10-CM | POA: Diagnosis not present

## 2023-12-19 DIAGNOSIS — I69359 Hemiplegia and hemiparesis following cerebral infarction affecting unspecified side: Secondary | ICD-10-CM | POA: Diagnosis not present

## 2023-12-19 DIAGNOSIS — R2681 Unsteadiness on feet: Secondary | ICD-10-CM | POA: Diagnosis not present

## 2023-12-20 DIAGNOSIS — I6389 Other cerebral infarction: Secondary | ICD-10-CM | POA: Diagnosis not present

## 2023-12-20 DIAGNOSIS — G8111 Spastic hemiplegia affecting right dominant side: Secondary | ICD-10-CM | POA: Diagnosis not present

## 2023-12-20 DIAGNOSIS — I69328 Other speech and language deficits following cerebral infarction: Secondary | ICD-10-CM | POA: Diagnosis not present

## 2023-12-20 DIAGNOSIS — R41841 Cognitive communication deficit: Secondary | ICD-10-CM | POA: Diagnosis not present

## 2023-12-21 DIAGNOSIS — R2681 Unsteadiness on feet: Secondary | ICD-10-CM | POA: Diagnosis not present

## 2023-12-21 DIAGNOSIS — I69359 Hemiplegia and hemiparesis following cerebral infarction affecting unspecified side: Secondary | ICD-10-CM | POA: Diagnosis not present

## 2023-12-21 DIAGNOSIS — G8110 Spastic hemiplegia affecting unspecified side: Secondary | ICD-10-CM | POA: Diagnosis not present

## 2023-12-22 DIAGNOSIS — G8110 Spastic hemiplegia affecting unspecified side: Secondary | ICD-10-CM | POA: Diagnosis not present

## 2023-12-22 DIAGNOSIS — I69359 Hemiplegia and hemiparesis following cerebral infarction affecting unspecified side: Secondary | ICD-10-CM | POA: Diagnosis not present

## 2023-12-22 DIAGNOSIS — R2681 Unsteadiness on feet: Secondary | ICD-10-CM | POA: Diagnosis not present

## 2023-12-23 DIAGNOSIS — G8111 Spastic hemiplegia affecting right dominant side: Secondary | ICD-10-CM | POA: Diagnosis not present

## 2023-12-23 DIAGNOSIS — I69328 Other speech and language deficits following cerebral infarction: Secondary | ICD-10-CM | POA: Diagnosis not present

## 2023-12-23 DIAGNOSIS — I6389 Other cerebral infarction: Secondary | ICD-10-CM | POA: Diagnosis not present

## 2023-12-23 DIAGNOSIS — R41841 Cognitive communication deficit: Secondary | ICD-10-CM | POA: Diagnosis not present

## 2023-12-26 DIAGNOSIS — I69359 Hemiplegia and hemiparesis following cerebral infarction affecting unspecified side: Secondary | ICD-10-CM | POA: Diagnosis not present

## 2023-12-26 DIAGNOSIS — R2681 Unsteadiness on feet: Secondary | ICD-10-CM | POA: Diagnosis not present

## 2023-12-26 DIAGNOSIS — G8110 Spastic hemiplegia affecting unspecified side: Secondary | ICD-10-CM | POA: Diagnosis not present

## 2023-12-27 DIAGNOSIS — R41841 Cognitive communication deficit: Secondary | ICD-10-CM | POA: Diagnosis not present

## 2023-12-27 DIAGNOSIS — I69328 Other speech and language deficits following cerebral infarction: Secondary | ICD-10-CM | POA: Diagnosis not present

## 2023-12-27 DIAGNOSIS — I6389 Other cerebral infarction: Secondary | ICD-10-CM | POA: Diagnosis not present

## 2023-12-27 DIAGNOSIS — G8111 Spastic hemiplegia affecting right dominant side: Secondary | ICD-10-CM | POA: Diagnosis not present

## 2023-12-28 DIAGNOSIS — I69359 Hemiplegia and hemiparesis following cerebral infarction affecting unspecified side: Secondary | ICD-10-CM | POA: Diagnosis not present

## 2023-12-28 DIAGNOSIS — G8110 Spastic hemiplegia affecting unspecified side: Secondary | ICD-10-CM | POA: Diagnosis not present

## 2023-12-28 DIAGNOSIS — R2681 Unsteadiness on feet: Secondary | ICD-10-CM | POA: Diagnosis not present

## 2023-12-29 DIAGNOSIS — I69328 Other speech and language deficits following cerebral infarction: Secondary | ICD-10-CM | POA: Diagnosis not present

## 2023-12-29 DIAGNOSIS — I6389 Other cerebral infarction: Secondary | ICD-10-CM | POA: Diagnosis not present

## 2023-12-29 DIAGNOSIS — G8111 Spastic hemiplegia affecting right dominant side: Secondary | ICD-10-CM | POA: Diagnosis not present

## 2023-12-29 DIAGNOSIS — R41841 Cognitive communication deficit: Secondary | ICD-10-CM | POA: Diagnosis not present

## 2023-12-30 DIAGNOSIS — I69359 Hemiplegia and hemiparesis following cerebral infarction affecting unspecified side: Secondary | ICD-10-CM | POA: Diagnosis not present

## 2023-12-30 DIAGNOSIS — R2681 Unsteadiness on feet: Secondary | ICD-10-CM | POA: Diagnosis not present

## 2023-12-30 DIAGNOSIS — G8110 Spastic hemiplegia affecting unspecified side: Secondary | ICD-10-CM | POA: Diagnosis not present

## 2024-01-02 DIAGNOSIS — I69359 Hemiplegia and hemiparesis following cerebral infarction affecting unspecified side: Secondary | ICD-10-CM | POA: Diagnosis not present

## 2024-01-02 DIAGNOSIS — R2681 Unsteadiness on feet: Secondary | ICD-10-CM | POA: Diagnosis not present

## 2024-01-02 DIAGNOSIS — G8110 Spastic hemiplegia affecting unspecified side: Secondary | ICD-10-CM | POA: Diagnosis not present

## 2024-01-03 DIAGNOSIS — G8111 Spastic hemiplegia affecting right dominant side: Secondary | ICD-10-CM | POA: Diagnosis not present

## 2024-01-03 DIAGNOSIS — R41841 Cognitive communication deficit: Secondary | ICD-10-CM | POA: Diagnosis not present

## 2024-01-03 DIAGNOSIS — I6389 Other cerebral infarction: Secondary | ICD-10-CM | POA: Diagnosis not present

## 2024-01-03 DIAGNOSIS — I69328 Other speech and language deficits following cerebral infarction: Secondary | ICD-10-CM | POA: Diagnosis not present

## 2024-01-04 DIAGNOSIS — G8110 Spastic hemiplegia affecting unspecified side: Secondary | ICD-10-CM | POA: Diagnosis not present

## 2024-01-04 DIAGNOSIS — R2681 Unsteadiness on feet: Secondary | ICD-10-CM | POA: Diagnosis not present

## 2024-01-04 DIAGNOSIS — I69359 Hemiplegia and hemiparesis following cerebral infarction affecting unspecified side: Secondary | ICD-10-CM | POA: Diagnosis not present

## 2024-01-05 DIAGNOSIS — I69328 Other speech and language deficits following cerebral infarction: Secondary | ICD-10-CM | POA: Diagnosis not present

## 2024-01-05 DIAGNOSIS — I6389 Other cerebral infarction: Secondary | ICD-10-CM | POA: Diagnosis not present

## 2024-01-05 DIAGNOSIS — G8111 Spastic hemiplegia affecting right dominant side: Secondary | ICD-10-CM | POA: Diagnosis not present

## 2024-01-05 DIAGNOSIS — R41841 Cognitive communication deficit: Secondary | ICD-10-CM | POA: Diagnosis not present

## 2024-01-06 DIAGNOSIS — I69359 Hemiplegia and hemiparesis following cerebral infarction affecting unspecified side: Secondary | ICD-10-CM | POA: Diagnosis not present

## 2024-01-06 DIAGNOSIS — R2681 Unsteadiness on feet: Secondary | ICD-10-CM | POA: Diagnosis not present

## 2024-01-06 DIAGNOSIS — G8110 Spastic hemiplegia affecting unspecified side: Secondary | ICD-10-CM | POA: Diagnosis not present

## 2024-01-10 DIAGNOSIS — I6389 Other cerebral infarction: Secondary | ICD-10-CM | POA: Diagnosis not present

## 2024-01-10 DIAGNOSIS — G8111 Spastic hemiplegia affecting right dominant side: Secondary | ICD-10-CM | POA: Diagnosis not present

## 2024-01-10 DIAGNOSIS — R41841 Cognitive communication deficit: Secondary | ICD-10-CM | POA: Diagnosis not present

## 2024-01-10 DIAGNOSIS — I69328 Other speech and language deficits following cerebral infarction: Secondary | ICD-10-CM | POA: Diagnosis not present

## 2024-01-11 DIAGNOSIS — G8110 Spastic hemiplegia affecting unspecified side: Secondary | ICD-10-CM | POA: Diagnosis not present

## 2024-01-11 DIAGNOSIS — R2681 Unsteadiness on feet: Secondary | ICD-10-CM | POA: Diagnosis not present

## 2024-01-11 DIAGNOSIS — I69359 Hemiplegia and hemiparesis following cerebral infarction affecting unspecified side: Secondary | ICD-10-CM | POA: Diagnosis not present

## 2024-01-13 DIAGNOSIS — R2681 Unsteadiness on feet: Secondary | ICD-10-CM | POA: Diagnosis not present

## 2024-01-13 DIAGNOSIS — G8111 Spastic hemiplegia affecting right dominant side: Secondary | ICD-10-CM | POA: Diagnosis not present

## 2024-01-13 DIAGNOSIS — G8110 Spastic hemiplegia affecting unspecified side: Secondary | ICD-10-CM | POA: Diagnosis not present

## 2024-01-13 DIAGNOSIS — I69328 Other speech and language deficits following cerebral infarction: Secondary | ICD-10-CM | POA: Diagnosis not present

## 2024-01-13 DIAGNOSIS — I6389 Other cerebral infarction: Secondary | ICD-10-CM | POA: Diagnosis not present

## 2024-01-13 DIAGNOSIS — I69359 Hemiplegia and hemiparesis following cerebral infarction affecting unspecified side: Secondary | ICD-10-CM | POA: Diagnosis not present

## 2024-01-13 DIAGNOSIS — R41841 Cognitive communication deficit: Secondary | ICD-10-CM | POA: Diagnosis not present

## 2024-01-17 DIAGNOSIS — I69328 Other speech and language deficits following cerebral infarction: Secondary | ICD-10-CM | POA: Diagnosis not present

## 2024-01-17 DIAGNOSIS — I6389 Other cerebral infarction: Secondary | ICD-10-CM | POA: Diagnosis not present

## 2024-01-17 DIAGNOSIS — Z23 Encounter for immunization: Secondary | ICD-10-CM | POA: Diagnosis not present

## 2024-01-17 DIAGNOSIS — G8111 Spastic hemiplegia affecting right dominant side: Secondary | ICD-10-CM | POA: Diagnosis not present

## 2024-01-17 DIAGNOSIS — R41841 Cognitive communication deficit: Secondary | ICD-10-CM | POA: Diagnosis not present

## 2024-01-18 DIAGNOSIS — I69359 Hemiplegia and hemiparesis following cerebral infarction affecting unspecified side: Secondary | ICD-10-CM | POA: Diagnosis not present

## 2024-01-18 DIAGNOSIS — G8110 Spastic hemiplegia affecting unspecified side: Secondary | ICD-10-CM | POA: Diagnosis not present

## 2024-01-18 DIAGNOSIS — R2681 Unsteadiness on feet: Secondary | ICD-10-CM | POA: Diagnosis not present

## 2024-01-19 DIAGNOSIS — G8110 Spastic hemiplegia affecting unspecified side: Secondary | ICD-10-CM | POA: Diagnosis not present

## 2024-01-19 DIAGNOSIS — R2681 Unsteadiness on feet: Secondary | ICD-10-CM | POA: Diagnosis not present

## 2024-01-19 DIAGNOSIS — I69359 Hemiplegia and hemiparesis following cerebral infarction affecting unspecified side: Secondary | ICD-10-CM | POA: Diagnosis not present

## 2024-01-21 DIAGNOSIS — R41841 Cognitive communication deficit: Secondary | ICD-10-CM | POA: Diagnosis not present

## 2024-01-21 DIAGNOSIS — I6389 Other cerebral infarction: Secondary | ICD-10-CM | POA: Diagnosis not present

## 2024-01-21 DIAGNOSIS — G8111 Spastic hemiplegia affecting right dominant side: Secondary | ICD-10-CM | POA: Diagnosis not present

## 2024-01-21 DIAGNOSIS — I69328 Other speech and language deficits following cerebral infarction: Secondary | ICD-10-CM | POA: Diagnosis not present

## 2024-01-24 DIAGNOSIS — R41841 Cognitive communication deficit: Secondary | ICD-10-CM | POA: Diagnosis not present

## 2024-01-24 DIAGNOSIS — I6389 Other cerebral infarction: Secondary | ICD-10-CM | POA: Diagnosis not present

## 2024-01-24 DIAGNOSIS — G8111 Spastic hemiplegia affecting right dominant side: Secondary | ICD-10-CM | POA: Diagnosis not present

## 2024-01-24 DIAGNOSIS — I69328 Other speech and language deficits following cerebral infarction: Secondary | ICD-10-CM | POA: Diagnosis not present

## 2024-01-25 DIAGNOSIS — G8110 Spastic hemiplegia affecting unspecified side: Secondary | ICD-10-CM | POA: Diagnosis not present

## 2024-01-25 DIAGNOSIS — I69359 Hemiplegia and hemiparesis following cerebral infarction affecting unspecified side: Secondary | ICD-10-CM | POA: Diagnosis not present

## 2024-01-25 DIAGNOSIS — R2681 Unsteadiness on feet: Secondary | ICD-10-CM | POA: Diagnosis not present

## 2024-01-26 DIAGNOSIS — R41841 Cognitive communication deficit: Secondary | ICD-10-CM | POA: Diagnosis not present

## 2024-01-26 DIAGNOSIS — I69328 Other speech and language deficits following cerebral infarction: Secondary | ICD-10-CM | POA: Diagnosis not present

## 2024-01-26 DIAGNOSIS — G8111 Spastic hemiplegia affecting right dominant side: Secondary | ICD-10-CM | POA: Diagnosis not present

## 2024-01-26 DIAGNOSIS — I6389 Other cerebral infarction: Secondary | ICD-10-CM | POA: Diagnosis not present

## 2024-01-27 DIAGNOSIS — R2681 Unsteadiness on feet: Secondary | ICD-10-CM | POA: Diagnosis not present

## 2024-01-27 DIAGNOSIS — G8110 Spastic hemiplegia affecting unspecified side: Secondary | ICD-10-CM | POA: Diagnosis not present

## 2024-01-27 DIAGNOSIS — I69359 Hemiplegia and hemiparesis following cerebral infarction affecting unspecified side: Secondary | ICD-10-CM | POA: Diagnosis not present

## 2024-01-30 DIAGNOSIS — Z23 Encounter for immunization: Secondary | ICD-10-CM | POA: Diagnosis not present

## 2024-01-30 DIAGNOSIS — I69359 Hemiplegia and hemiparesis following cerebral infarction affecting unspecified side: Secondary | ICD-10-CM | POA: Diagnosis not present

## 2024-01-30 DIAGNOSIS — G8110 Spastic hemiplegia affecting unspecified side: Secondary | ICD-10-CM | POA: Diagnosis not present

## 2024-01-30 DIAGNOSIS — R2681 Unsteadiness on feet: Secondary | ICD-10-CM | POA: Diagnosis not present

## 2024-01-31 DIAGNOSIS — G8111 Spastic hemiplegia affecting right dominant side: Secondary | ICD-10-CM | POA: Diagnosis not present

## 2024-01-31 DIAGNOSIS — R41841 Cognitive communication deficit: Secondary | ICD-10-CM | POA: Diagnosis not present

## 2024-01-31 DIAGNOSIS — I6389 Other cerebral infarction: Secondary | ICD-10-CM | POA: Diagnosis not present

## 2024-01-31 DIAGNOSIS — I69328 Other speech and language deficits following cerebral infarction: Secondary | ICD-10-CM | POA: Diagnosis not present

## 2024-02-01 ENCOUNTER — Encounter: Admitting: Physical Medicine & Rehabilitation

## 2024-02-02 DIAGNOSIS — R41841 Cognitive communication deficit: Secondary | ICD-10-CM | POA: Diagnosis not present

## 2024-02-02 DIAGNOSIS — G8111 Spastic hemiplegia affecting right dominant side: Secondary | ICD-10-CM | POA: Diagnosis not present

## 2024-02-02 DIAGNOSIS — I69328 Other speech and language deficits following cerebral infarction: Secondary | ICD-10-CM | POA: Diagnosis not present

## 2024-02-02 DIAGNOSIS — I6389 Other cerebral infarction: Secondary | ICD-10-CM | POA: Diagnosis not present

## 2024-02-06 DIAGNOSIS — I69359 Hemiplegia and hemiparesis following cerebral infarction affecting unspecified side: Secondary | ICD-10-CM | POA: Diagnosis not present

## 2024-02-06 DIAGNOSIS — R2681 Unsteadiness on feet: Secondary | ICD-10-CM | POA: Diagnosis not present

## 2024-02-06 DIAGNOSIS — G8110 Spastic hemiplegia affecting unspecified side: Secondary | ICD-10-CM | POA: Diagnosis not present

## 2024-02-09 DIAGNOSIS — G8111 Spastic hemiplegia affecting right dominant side: Secondary | ICD-10-CM | POA: Diagnosis not present

## 2024-02-09 DIAGNOSIS — R41841 Cognitive communication deficit: Secondary | ICD-10-CM | POA: Diagnosis not present

## 2024-02-09 DIAGNOSIS — I69328 Other speech and language deficits following cerebral infarction: Secondary | ICD-10-CM | POA: Diagnosis not present

## 2024-02-09 DIAGNOSIS — I6389 Other cerebral infarction: Secondary | ICD-10-CM | POA: Diagnosis not present

## 2024-02-13 DIAGNOSIS — R41841 Cognitive communication deficit: Secondary | ICD-10-CM | POA: Diagnosis not present

## 2024-02-13 DIAGNOSIS — G8111 Spastic hemiplegia affecting right dominant side: Secondary | ICD-10-CM | POA: Diagnosis not present

## 2024-02-13 DIAGNOSIS — I69359 Hemiplegia and hemiparesis following cerebral infarction affecting unspecified side: Secondary | ICD-10-CM | POA: Diagnosis not present

## 2024-02-13 DIAGNOSIS — G8110 Spastic hemiplegia affecting unspecified side: Secondary | ICD-10-CM | POA: Diagnosis not present

## 2024-02-13 DIAGNOSIS — I69328 Other speech and language deficits following cerebral infarction: Secondary | ICD-10-CM | POA: Diagnosis not present

## 2024-02-13 DIAGNOSIS — R2681 Unsteadiness on feet: Secondary | ICD-10-CM | POA: Diagnosis not present

## 2024-02-13 DIAGNOSIS — I6389 Other cerebral infarction: Secondary | ICD-10-CM | POA: Diagnosis not present

## 2024-02-15 DIAGNOSIS — R41841 Cognitive communication deficit: Secondary | ICD-10-CM | POA: Diagnosis not present

## 2024-02-15 DIAGNOSIS — I69328 Other speech and language deficits following cerebral infarction: Secondary | ICD-10-CM | POA: Diagnosis not present

## 2024-02-15 DIAGNOSIS — G8111 Spastic hemiplegia affecting right dominant side: Secondary | ICD-10-CM | POA: Diagnosis not present

## 2024-02-15 DIAGNOSIS — I6389 Other cerebral infarction: Secondary | ICD-10-CM | POA: Diagnosis not present

## 2024-02-20 DIAGNOSIS — G8110 Spastic hemiplegia affecting unspecified side: Secondary | ICD-10-CM | POA: Diagnosis not present

## 2024-02-20 DIAGNOSIS — R2681 Unsteadiness on feet: Secondary | ICD-10-CM | POA: Diagnosis not present

## 2024-02-20 DIAGNOSIS — Z961 Presence of intraocular lens: Secondary | ICD-10-CM | POA: Diagnosis not present

## 2024-02-20 DIAGNOSIS — H401132 Primary open-angle glaucoma, bilateral, moderate stage: Secondary | ICD-10-CM | POA: Diagnosis not present

## 2024-02-20 DIAGNOSIS — I69359 Hemiplegia and hemiparesis following cerebral infarction affecting unspecified side: Secondary | ICD-10-CM | POA: Diagnosis not present

## 2024-02-21 DIAGNOSIS — G8111 Spastic hemiplegia affecting right dominant side: Secondary | ICD-10-CM | POA: Diagnosis not present

## 2024-02-21 DIAGNOSIS — I6389 Other cerebral infarction: Secondary | ICD-10-CM | POA: Diagnosis not present

## 2024-02-21 DIAGNOSIS — I69328 Other speech and language deficits following cerebral infarction: Secondary | ICD-10-CM | POA: Diagnosis not present

## 2024-02-21 DIAGNOSIS — R41841 Cognitive communication deficit: Secondary | ICD-10-CM | POA: Diagnosis not present

## 2024-02-24 DIAGNOSIS — I69328 Other speech and language deficits following cerebral infarction: Secondary | ICD-10-CM | POA: Diagnosis not present

## 2024-02-24 DIAGNOSIS — R41841 Cognitive communication deficit: Secondary | ICD-10-CM | POA: Diagnosis not present

## 2024-02-24 DIAGNOSIS — I6389 Other cerebral infarction: Secondary | ICD-10-CM | POA: Diagnosis not present

## 2024-02-24 DIAGNOSIS — G8111 Spastic hemiplegia affecting right dominant side: Secondary | ICD-10-CM | POA: Diagnosis not present

## 2024-02-27 DIAGNOSIS — I69359 Hemiplegia and hemiparesis following cerebral infarction affecting unspecified side: Secondary | ICD-10-CM | POA: Diagnosis not present

## 2024-02-27 DIAGNOSIS — R2681 Unsteadiness on feet: Secondary | ICD-10-CM | POA: Diagnosis not present

## 2024-02-27 DIAGNOSIS — G8110 Spastic hemiplegia affecting unspecified side: Secondary | ICD-10-CM | POA: Diagnosis not present

## 2024-02-28 DIAGNOSIS — G8111 Spastic hemiplegia affecting right dominant side: Secondary | ICD-10-CM | POA: Diagnosis not present

## 2024-02-28 DIAGNOSIS — I69328 Other speech and language deficits following cerebral infarction: Secondary | ICD-10-CM | POA: Diagnosis not present

## 2024-02-28 DIAGNOSIS — I6389 Other cerebral infarction: Secondary | ICD-10-CM | POA: Diagnosis not present

## 2024-02-28 DIAGNOSIS — R41841 Cognitive communication deficit: Secondary | ICD-10-CM | POA: Diagnosis not present

## 2024-03-01 DIAGNOSIS — G8111 Spastic hemiplegia affecting right dominant side: Secondary | ICD-10-CM | POA: Diagnosis not present

## 2024-03-01 DIAGNOSIS — I69328 Other speech and language deficits following cerebral infarction: Secondary | ICD-10-CM | POA: Diagnosis not present

## 2024-03-01 DIAGNOSIS — R41841 Cognitive communication deficit: Secondary | ICD-10-CM | POA: Diagnosis not present

## 2024-03-01 DIAGNOSIS — I6389 Other cerebral infarction: Secondary | ICD-10-CM | POA: Diagnosis not present

## 2024-03-02 DIAGNOSIS — G8110 Spastic hemiplegia affecting unspecified side: Secondary | ICD-10-CM | POA: Diagnosis not present

## 2024-03-02 DIAGNOSIS — I69359 Hemiplegia and hemiparesis following cerebral infarction affecting unspecified side: Secondary | ICD-10-CM | POA: Diagnosis not present

## 2024-03-02 DIAGNOSIS — R2681 Unsteadiness on feet: Secondary | ICD-10-CM | POA: Diagnosis not present

## 2024-03-05 DIAGNOSIS — G8110 Spastic hemiplegia affecting unspecified side: Secondary | ICD-10-CM | POA: Diagnosis not present

## 2024-03-05 DIAGNOSIS — I69359 Hemiplegia and hemiparesis following cerebral infarction affecting unspecified side: Secondary | ICD-10-CM | POA: Diagnosis not present

## 2024-03-05 DIAGNOSIS — R2681 Unsteadiness on feet: Secondary | ICD-10-CM | POA: Diagnosis not present

## 2024-03-06 DIAGNOSIS — I69328 Other speech and language deficits following cerebral infarction: Secondary | ICD-10-CM | POA: Diagnosis not present

## 2024-03-06 DIAGNOSIS — G8111 Spastic hemiplegia affecting right dominant side: Secondary | ICD-10-CM | POA: Diagnosis not present

## 2024-03-06 DIAGNOSIS — R41841 Cognitive communication deficit: Secondary | ICD-10-CM | POA: Diagnosis not present

## 2024-03-06 DIAGNOSIS — I6389 Other cerebral infarction: Secondary | ICD-10-CM | POA: Diagnosis not present

## 2024-03-08 DIAGNOSIS — I6389 Other cerebral infarction: Secondary | ICD-10-CM | POA: Diagnosis not present

## 2024-03-08 DIAGNOSIS — G8111 Spastic hemiplegia affecting right dominant side: Secondary | ICD-10-CM | POA: Diagnosis not present

## 2024-03-08 DIAGNOSIS — R41841 Cognitive communication deficit: Secondary | ICD-10-CM | POA: Diagnosis not present

## 2024-03-08 DIAGNOSIS — I69328 Other speech and language deficits following cerebral infarction: Secondary | ICD-10-CM | POA: Diagnosis not present

## 2024-03-09 DIAGNOSIS — G8110 Spastic hemiplegia affecting unspecified side: Secondary | ICD-10-CM | POA: Diagnosis not present

## 2024-03-09 DIAGNOSIS — R2681 Unsteadiness on feet: Secondary | ICD-10-CM | POA: Diagnosis not present

## 2024-03-09 DIAGNOSIS — I69359 Hemiplegia and hemiparesis following cerebral infarction affecting unspecified side: Secondary | ICD-10-CM | POA: Diagnosis not present

## 2024-03-12 ENCOUNTER — Encounter: Attending: Physical Medicine and Rehabilitation | Admitting: Physical Medicine and Rehabilitation

## 2024-03-12 ENCOUNTER — Telehealth: Payer: Self-pay | Admitting: Physical Medicine & Rehabilitation

## 2024-03-12 DIAGNOSIS — I69351 Hemiplegia and hemiparesis following cerebral infarction affecting right dominant side: Secondary | ICD-10-CM | POA: Insufficient documentation

## 2024-03-12 DIAGNOSIS — R2681 Unsteadiness on feet: Secondary | ICD-10-CM | POA: Diagnosis not present

## 2024-03-12 DIAGNOSIS — G8110 Spastic hemiplegia affecting unspecified side: Secondary | ICD-10-CM | POA: Diagnosis not present

## 2024-03-12 DIAGNOSIS — I69359 Hemiplegia and hemiparesis following cerebral infarction affecting unspecified side: Secondary | ICD-10-CM | POA: Diagnosis not present

## 2024-03-12 MED ORDER — ONABOTULINUMTOXINA 100 UNITS IJ SOLR
500.0000 [IU] | Freq: Once | INTRAMUSCULAR | Status: AC
  Administered 2024-03-12: 500 [IU] via INTRAMUSCULAR

## 2024-03-12 NOTE — Telephone Encounter (Signed)
 Patient called stating she is in extreme pain anda is experiencing spasms. They state it has been 6-8 weeks since the last botox  injection and want a sooner appt then 12/10. They also are requesting a call from you personally.

## 2024-03-12 NOTE — Progress Notes (Signed)
 Botox  Injection for spasticity of upper extremity using needle US  guidance Indication: No diagnosis found.  P30.648 Dilution: 100 Units/ml        Total Units Injected: 500 Indication: Severe spasticity which interferes with ADL,mobility and/or  hygiene and is unresponsive to medication management and other conservative care Informed consent was obtained after describing risks and benefits of the procedure with the patient. This includes bleeding, bruising, infection, excessive weakness, or medication side effects. A REMS form is on file and signed.  Needle: 50mm injectable monopolar needle electrode    Number of units per muscle Pectoralis Major 25 units Pectoralis Minor 0 units Right teres major 0 u Right lats 0 Biceps 100 units divided into 2 locations Brachioradialis 0 units FCR 25 units FCU  25units FDS 100 units FDP 100 units FPL 0 units Palmaris Longus 0 units Pronator Teres 50 units Pronator Quadratus 50 units Lumbricals 15 units Adductor Pollicis: 10 U  All injections were done after obtaining appropriate US  guidance and after negative drawback for blood. The patient tolerated the procedure well. Post procedure instructions were given. No follow-ups on file.

## 2024-03-13 DIAGNOSIS — R41841 Cognitive communication deficit: Secondary | ICD-10-CM | POA: Diagnosis not present

## 2024-03-13 DIAGNOSIS — G8111 Spastic hemiplegia affecting right dominant side: Secondary | ICD-10-CM | POA: Diagnosis not present

## 2024-03-13 DIAGNOSIS — I69328 Other speech and language deficits following cerebral infarction: Secondary | ICD-10-CM | POA: Diagnosis not present

## 2024-03-13 DIAGNOSIS — I6389 Other cerebral infarction: Secondary | ICD-10-CM | POA: Diagnosis not present

## 2024-03-14 DIAGNOSIS — G8110 Spastic hemiplegia affecting unspecified side: Secondary | ICD-10-CM | POA: Diagnosis not present

## 2024-03-14 DIAGNOSIS — I69359 Hemiplegia and hemiparesis following cerebral infarction affecting unspecified side: Secondary | ICD-10-CM | POA: Diagnosis not present

## 2024-03-14 DIAGNOSIS — R2681 Unsteadiness on feet: Secondary | ICD-10-CM | POA: Diagnosis not present

## 2024-03-15 DIAGNOSIS — G8111 Spastic hemiplegia affecting right dominant side: Secondary | ICD-10-CM | POA: Diagnosis not present

## 2024-03-15 DIAGNOSIS — R41841 Cognitive communication deficit: Secondary | ICD-10-CM | POA: Diagnosis not present

## 2024-03-15 DIAGNOSIS — I69328 Other speech and language deficits following cerebral infarction: Secondary | ICD-10-CM | POA: Diagnosis not present

## 2024-03-15 DIAGNOSIS — I6389 Other cerebral infarction: Secondary | ICD-10-CM | POA: Diagnosis not present

## 2024-03-28 ENCOUNTER — Encounter: Admitting: Physical Medicine & Rehabilitation

## 2024-06-06 ENCOUNTER — Encounter: Admitting: Physical Medicine & Rehabilitation

## 2024-06-13 ENCOUNTER — Encounter: Admitting: Physical Medicine & Rehabilitation
